# Patient Record
Sex: Male | Born: 1950
Health system: Southern US, Community
[De-identification: ages and names within clinical notes are randomized; demographics above are authoritative.]

## PROBLEM LIST (undated history)

## (undated) DIAGNOSIS — Z9109 Other allergy status, other than to drugs and biological substances: Secondary | ICD-10-CM

## (undated) DIAGNOSIS — K922 Gastrointestinal hemorrhage, unspecified: Secondary | ICD-10-CM

## (undated) DIAGNOSIS — T7840XA Allergy, unspecified, initial encounter: Secondary | ICD-10-CM

## (undated) DIAGNOSIS — M87059 Idiopathic aseptic necrosis of unspecified femur: Secondary | ICD-10-CM

## (undated) DIAGNOSIS — J449 Chronic obstructive pulmonary disease, unspecified: Secondary | ICD-10-CM

## (undated) DIAGNOSIS — K226 Gastro-esophageal laceration-hemorrhage syndrome: Secondary | ICD-10-CM

## (undated) DIAGNOSIS — D62 Acute posthemorrhagic anemia: Secondary | ICD-10-CM

## (undated) DIAGNOSIS — E785 Hyperlipidemia, unspecified: Secondary | ICD-10-CM

## (undated) DIAGNOSIS — S82201A Unspecified fracture of shaft of right tibia, initial encounter for closed fracture: Secondary | ICD-10-CM

## (undated) DIAGNOSIS — F17200 Nicotine dependence, unspecified, uncomplicated: Secondary | ICD-10-CM

## (undated) DIAGNOSIS — I10 Essential (primary) hypertension: Secondary | ICD-10-CM

## (undated) DIAGNOSIS — S2249XA Multiple fractures of ribs, unspecified side, initial encounter for closed fracture: Secondary | ICD-10-CM

## (undated) DIAGNOSIS — Z8781 Personal history of (healed) traumatic fracture: Secondary | ICD-10-CM

## (undated) DIAGNOSIS — S42009A Fracture of unspecified part of unspecified clavicle, initial encounter for closed fracture: Secondary | ICD-10-CM

## (undated) HISTORY — PX: KNEE SURGERY: SHX244

## (undated) HISTORY — DX: Essential (primary) hypertension: I10

## (undated) HISTORY — PX: WRIST SURGERY: SHX841

## (undated) HISTORY — DX: Chronic obstructive pulmonary disease, unspecified: J44.9

## (undated) HISTORY — DX: Allergy, unspecified, initial encounter: T78.40XA

## (undated) HISTORY — DX: Hyperlipidemia, unspecified: E78.5

## (undated) HISTORY — DX: Nicotine dependence, unspecified, uncomplicated: F17.200

## (undated) HISTORY — PX: ANKLE SURGERY: SHX546

---

## 1999-07-08 ENCOUNTER — Encounter: Payer: Self-pay | Admitting: Emergency Medicine

## 1999-07-08 ENCOUNTER — Emergency Department (HOSPITAL_COMMUNITY): Admission: EM | Admit: 1999-07-08 | Discharge: 1999-07-08 | Payer: Self-pay | Admitting: Emergency Medicine

## 1999-07-09 ENCOUNTER — Encounter: Payer: Self-pay | Admitting: Emergency Medicine

## 1999-08-19 ENCOUNTER — Encounter: Admission: RE | Admit: 1999-08-19 | Discharge: 1999-10-13 | Payer: Self-pay | Admitting: Specialist

## 1999-11-09 ENCOUNTER — Encounter: Payer: Self-pay | Admitting: Emergency Medicine

## 1999-11-09 ENCOUNTER — Emergency Department (HOSPITAL_COMMUNITY): Admission: EM | Admit: 1999-11-09 | Discharge: 1999-11-09 | Payer: Self-pay | Admitting: Emergency Medicine

## 2000-04-04 ENCOUNTER — Emergency Department (HOSPITAL_COMMUNITY): Admission: EM | Admit: 2000-04-04 | Discharge: 2000-04-04 | Payer: Self-pay | Admitting: Emergency Medicine

## 2001-12-20 ENCOUNTER — Emergency Department (HOSPITAL_COMMUNITY): Admission: EM | Admit: 2001-12-20 | Discharge: 2001-12-21 | Payer: Self-pay | Admitting: Emergency Medicine

## 2001-12-21 ENCOUNTER — Encounter: Payer: Self-pay | Admitting: Emergency Medicine

## 2001-12-26 ENCOUNTER — Ambulatory Visit (HOSPITAL_BASED_OUTPATIENT_CLINIC_OR_DEPARTMENT_OTHER): Admission: RE | Admit: 2001-12-26 | Discharge: 2001-12-26 | Payer: Self-pay | Admitting: Orthopedic Surgery

## 2002-01-02 ENCOUNTER — Ambulatory Visit (HOSPITAL_BASED_OUTPATIENT_CLINIC_OR_DEPARTMENT_OTHER): Admission: RE | Admit: 2002-01-02 | Discharge: 2002-01-02 | Payer: Self-pay | Admitting: Orthopedic Surgery

## 2002-01-03 ENCOUNTER — Emergency Department (HOSPITAL_COMMUNITY): Admission: EM | Admit: 2002-01-03 | Discharge: 2002-01-03 | Payer: Self-pay | Admitting: *Deleted

## 2002-09-18 ENCOUNTER — Encounter: Payer: Self-pay | Admitting: Orthopedic Surgery

## 2002-09-18 ENCOUNTER — Inpatient Hospital Stay (HOSPITAL_COMMUNITY): Admission: RE | Admit: 2002-09-18 | Discharge: 2002-09-19 | Payer: Self-pay | Admitting: Orthopedic Surgery

## 2003-05-16 ENCOUNTER — Encounter: Payer: Self-pay | Admitting: Emergency Medicine

## 2003-05-17 ENCOUNTER — Encounter: Payer: Self-pay | Admitting: General Surgery

## 2003-05-17 ENCOUNTER — Inpatient Hospital Stay (HOSPITAL_COMMUNITY): Admission: AC | Admit: 2003-05-17 | Discharge: 2003-06-12 | Payer: Self-pay

## 2003-05-26 ENCOUNTER — Encounter: Payer: Self-pay | Admitting: General Surgery

## 2003-05-28 ENCOUNTER — Encounter: Payer: Self-pay | Admitting: General Surgery

## 2003-06-01 ENCOUNTER — Encounter: Payer: Self-pay | Admitting: Cardiothoracic Surgery

## 2003-06-02 ENCOUNTER — Encounter: Payer: Self-pay | Admitting: Cardiothoracic Surgery

## 2003-06-04 ENCOUNTER — Encounter: Payer: Self-pay | Admitting: Cardiothoracic Surgery

## 2003-06-09 ENCOUNTER — Encounter: Payer: Self-pay | Admitting: Thoracic Surgery

## 2003-06-10 ENCOUNTER — Encounter: Payer: Self-pay | Admitting: Thoracic Surgery

## 2003-06-11 ENCOUNTER — Encounter: Payer: Self-pay | Admitting: Thoracic Surgery

## 2003-06-20 ENCOUNTER — Encounter: Payer: Self-pay | Admitting: Thoracic Surgery

## 2003-06-20 ENCOUNTER — Encounter: Admission: RE | Admit: 2003-06-20 | Discharge: 2003-06-20 | Payer: Self-pay | Admitting: Thoracic Surgery

## 2003-07-10 ENCOUNTER — Encounter: Admission: RE | Admit: 2003-07-10 | Discharge: 2003-07-10 | Payer: Self-pay | Admitting: Thoracic Surgery

## 2003-07-10 ENCOUNTER — Encounter: Payer: Self-pay | Admitting: Thoracic Surgery

## 2003-09-10 ENCOUNTER — Encounter: Admission: RE | Admit: 2003-09-10 | Discharge: 2003-09-10 | Payer: Self-pay | Admitting: Thoracic Surgery

## 2003-12-31 ENCOUNTER — Encounter: Admission: RE | Admit: 2003-12-31 | Discharge: 2003-12-31 | Payer: Self-pay | Admitting: Thoracic Surgery

## 2004-01-15 ENCOUNTER — Ambulatory Visit (HOSPITAL_COMMUNITY): Admission: RE | Admit: 2004-01-15 | Discharge: 2004-01-15 | Payer: Self-pay | Admitting: Orthopedic Surgery

## 2004-09-20 ENCOUNTER — Emergency Department (HOSPITAL_COMMUNITY): Admission: EM | Admit: 2004-09-20 | Discharge: 2004-09-20 | Payer: Self-pay | Admitting: Family Medicine

## 2004-11-10 ENCOUNTER — Ambulatory Visit: Payer: Self-pay | Admitting: Family Medicine

## 2004-11-23 ENCOUNTER — Ambulatory Visit: Payer: Self-pay | Admitting: Family Medicine

## 2004-11-30 ENCOUNTER — Ambulatory Visit: Payer: Self-pay | Admitting: Family Medicine

## 2004-12-06 ENCOUNTER — Emergency Department (HOSPITAL_COMMUNITY): Admission: EM | Admit: 2004-12-06 | Discharge: 2004-12-06 | Payer: Self-pay | Admitting: Emergency Medicine

## 2004-12-07 ENCOUNTER — Ambulatory Visit: Payer: Self-pay | Admitting: Sports Medicine

## 2004-12-28 ENCOUNTER — Ambulatory Visit: Payer: Self-pay | Admitting: Family Medicine

## 2005-01-25 ENCOUNTER — Ambulatory Visit: Payer: Self-pay | Admitting: Family Medicine

## 2005-03-02 ENCOUNTER — Ambulatory Visit: Payer: Self-pay | Admitting: Family Medicine

## 2005-04-14 ENCOUNTER — Ambulatory Visit (HOSPITAL_COMMUNITY): Admission: RE | Admit: 2005-04-14 | Discharge: 2005-04-14 | Payer: Self-pay | Admitting: Gastroenterology

## 2005-11-16 ENCOUNTER — Ambulatory Visit: Payer: Self-pay | Admitting: Family Medicine

## 2005-12-21 ENCOUNTER — Ambulatory Visit: Payer: Self-pay | Admitting: Family Medicine

## 2005-12-28 ENCOUNTER — Ambulatory Visit (HOSPITAL_COMMUNITY): Admission: RE | Admit: 2005-12-28 | Discharge: 2005-12-28 | Payer: Self-pay | Admitting: Family Medicine

## 2006-01-20 ENCOUNTER — Ambulatory Visit: Payer: Self-pay | Admitting: Family Medicine

## 2006-03-08 ENCOUNTER — Ambulatory Visit: Payer: Self-pay | Admitting: Family Medicine

## 2006-04-11 ENCOUNTER — Ambulatory Visit: Payer: Self-pay | Admitting: Sports Medicine

## 2006-04-11 ENCOUNTER — Ambulatory Visit (HOSPITAL_COMMUNITY): Admission: RE | Admit: 2006-04-11 | Discharge: 2006-04-11 | Payer: Self-pay | Admitting: Family Medicine

## 2006-05-25 ENCOUNTER — Ambulatory Visit: Payer: Self-pay | Admitting: Family Medicine

## 2006-10-27 ENCOUNTER — Ambulatory Visit: Payer: Self-pay | Admitting: Family Medicine

## 2006-11-03 ENCOUNTER — Ambulatory Visit: Payer: Self-pay | Admitting: Family Medicine

## 2006-11-03 ENCOUNTER — Encounter (INDEPENDENT_AMBULATORY_CARE_PROVIDER_SITE_OTHER): Payer: Self-pay | Admitting: Family Medicine

## 2006-11-03 LAB — CONVERTED CEMR LAB
ALT: 31 units/L (ref 0–53)
AST: 32 units/L (ref 0–37)
Albumin: 4.5 g/dL (ref 3.5–5.2)
Alkaline Phosphatase: 77 units/L (ref 39–117)
BUN: 12 mg/dL (ref 6–23)
CO2: 25 meq/L (ref 19–32)
Calcium: 10.1 mg/dL (ref 8.4–10.5)
Chloride: 104 meq/L (ref 96–112)
Cholesterol: 226 mg/dL — ABNORMAL HIGH (ref 0–200)
Creatinine, Ser: 0.95 mg/dL (ref 0.40–1.50)
Glucose, Bld: 94 mg/dL (ref 70–99)
HDL: 62 mg/dL (ref >39)
INR: 1.1 (ref 0.0–1.5)
LDL Cholesterol: 146 mg/dL — ABNORMAL HIGH (ref 0–99)
Potassium: 4 meq/L (ref 3.5–5.3)
Prothrombin Time: 14.1 s (ref 11.6–15.2)
Sodium: 141 meq/L (ref 135–145)
TSH: 1.285 microintl units/mL (ref 0.350–5.50)
Total Bilirubin: 0.6 mg/dL (ref 0.3–1.2)
Total CHOL/HDL Ratio: 3.6
Total Protein: 8 g/dL (ref 6.0–8.3)
Triglycerides: 90 mg/dL (ref <150)
VLDL: 18 mg/dL (ref 0–40)
aPTT: 33 s (ref 24–37)

## 2006-11-06 ENCOUNTER — Ambulatory Visit: Payer: Self-pay | Admitting: Family Medicine

## 2006-12-08 ENCOUNTER — Encounter: Admission: RE | Admit: 2006-12-08 | Discharge: 2006-12-08 | Payer: Self-pay | Admitting: Sports Medicine

## 2006-12-21 DIAGNOSIS — J449 Chronic obstructive pulmonary disease, unspecified: Secondary | ICD-10-CM

## 2006-12-21 DIAGNOSIS — Z720 Tobacco use: Secondary | ICD-10-CM | POA: Insufficient documentation

## 2006-12-21 DIAGNOSIS — E785 Hyperlipidemia, unspecified: Secondary | ICD-10-CM | POA: Insufficient documentation

## 2006-12-21 DIAGNOSIS — I10 Essential (primary) hypertension: Secondary | ICD-10-CM | POA: Insufficient documentation

## 2006-12-21 DIAGNOSIS — J4489 Other specified chronic obstructive pulmonary disease: Secondary | ICD-10-CM | POA: Insufficient documentation

## 2006-12-21 DIAGNOSIS — F17200 Nicotine dependence, unspecified, uncomplicated: Secondary | ICD-10-CM

## 2006-12-21 HISTORY — DX: Nicotine dependence, unspecified, uncomplicated: F17.200

## 2006-12-21 HISTORY — DX: Essential (primary) hypertension: I10

## 2006-12-21 HISTORY — DX: Hyperlipidemia, unspecified: E78.5

## 2006-12-21 HISTORY — DX: Chronic obstructive pulmonary disease, unspecified: J44.9

## 2006-12-28 ENCOUNTER — Telehealth: Payer: Self-pay | Admitting: *Deleted

## 2007-03-12 ENCOUNTER — Ambulatory Visit: Payer: Self-pay | Admitting: Sports Medicine

## 2007-03-21 ENCOUNTER — Ambulatory Visit: Payer: Self-pay | Admitting: Cardiology

## 2007-03-21 ENCOUNTER — Ambulatory Visit (HOSPITAL_COMMUNITY): Admission: RE | Admit: 2007-03-21 | Discharge: 2007-03-21 | Payer: Self-pay | Admitting: Sports Medicine

## 2007-03-21 ENCOUNTER — Encounter: Payer: Self-pay | Admitting: Sports Medicine

## 2007-05-03 ENCOUNTER — Ambulatory Visit: Payer: Self-pay | Admitting: Sports Medicine

## 2007-05-03 DIAGNOSIS — J301 Allergic rhinitis due to pollen: Secondary | ICD-10-CM | POA: Insufficient documentation

## 2007-08-22 ENCOUNTER — Encounter (INDEPENDENT_AMBULATORY_CARE_PROVIDER_SITE_OTHER): Payer: Self-pay | Admitting: Family Medicine

## 2007-08-22 ENCOUNTER — Ambulatory Visit: Payer: Self-pay | Admitting: Family Medicine

## 2007-08-22 DIAGNOSIS — Q6689 Other  specified congenital deformities of feet: Secondary | ICD-10-CM | POA: Insufficient documentation

## 2007-08-22 LAB — CONVERTED CEMR LAB
ALT: 27 units/L (ref 0–53)
AST: 33 units/L (ref 0–37)
Albumin: 4.7 g/dL (ref 3.5–5.2)
Alkaline Phosphatase: 85 units/L (ref 39–117)
BUN: 13 mg/dL (ref 6–23)
CO2: 25 meq/L (ref 19–32)
Calcium: 10.3 mg/dL (ref 8.4–10.5)
Chloride: 100 meq/L (ref 96–112)
Creatinine, Ser: 1.06 mg/dL (ref 0.40–1.50)
Glucose, Bld: 89 mg/dL (ref 70–99)
Potassium: 3.7 meq/L (ref 3.5–5.3)
Sodium: 137 meq/L (ref 135–145)
Total Bilirubin: 0.7 mg/dL (ref 0.3–1.2)
Total Protein: 8.1 g/dL (ref 6.0–8.3)

## 2007-08-23 ENCOUNTER — Encounter (INDEPENDENT_AMBULATORY_CARE_PROVIDER_SITE_OTHER): Payer: Self-pay | Admitting: Family Medicine

## 2007-09-06 ENCOUNTER — Ambulatory Visit: Payer: Self-pay | Admitting: Family Medicine

## 2007-11-23 ENCOUNTER — Ambulatory Visit: Payer: Self-pay | Admitting: Family Medicine

## 2007-12-31 ENCOUNTER — Encounter (INDEPENDENT_AMBULATORY_CARE_PROVIDER_SITE_OTHER): Payer: Self-pay | Admitting: Family Medicine

## 2008-01-07 ENCOUNTER — Ambulatory Visit: Payer: Self-pay | Admitting: Sports Medicine

## 2008-02-06 ENCOUNTER — Ambulatory Visit: Payer: Self-pay | Admitting: Family Medicine

## 2008-02-06 ENCOUNTER — Encounter (INDEPENDENT_AMBULATORY_CARE_PROVIDER_SITE_OTHER): Payer: Self-pay | Admitting: Family Medicine

## 2008-02-06 LAB — CONVERTED CEMR LAB
Cholesterol: 226 mg/dL — ABNORMAL HIGH (ref 0–200)
HDL: 77 mg/dL (ref >39)
LDL Cholesterol: 129 mg/dL — ABNORMAL HIGH (ref 0–99)
Total CHOL/HDL Ratio: 2.9
Triglycerides: 99 mg/dL (ref <150)
VLDL: 20 mg/dL (ref 0–40)

## 2008-02-08 ENCOUNTER — Encounter (INDEPENDENT_AMBULATORY_CARE_PROVIDER_SITE_OTHER): Payer: Self-pay | Admitting: Family Medicine

## 2008-02-19 ENCOUNTER — Encounter (INDEPENDENT_AMBULATORY_CARE_PROVIDER_SITE_OTHER): Payer: Self-pay | Admitting: *Deleted

## 2008-04-28 ENCOUNTER — Ambulatory Visit: Payer: Self-pay | Admitting: Family Medicine

## 2008-04-28 ENCOUNTER — Encounter: Payer: Self-pay | Admitting: Family Medicine

## 2008-04-28 LAB — CONVERTED CEMR LAB

## 2008-05-05 LAB — CONVERTED CEMR LAB
ALT: 87 units/L — ABNORMAL HIGH (ref 0–53)
AST: 112 units/L — ABNORMAL HIGH (ref 0–37)
Albumin: 4.7 g/dL (ref 3.5–5.2)
Alkaline Phosphatase: 105 units/L (ref 39–117)
BUN: 11 mg/dL (ref 6–23)
CO2: 21 meq/L (ref 19–32)
Calcium: 9.5 mg/dL (ref 8.4–10.5)
Chloride: 98 meq/L (ref 96–112)
Creatinine, Ser: 0.86 mg/dL (ref 0.40–1.50)
Glucose, Bld: 72 mg/dL (ref 70–99)
Potassium: 4.7 meq/L (ref 3.5–5.3)
Sodium: 139 meq/L (ref 135–145)
Total Bilirubin: 0.6 mg/dL (ref 0.3–1.2)
Total Protein: 8.2 g/dL (ref 6.0–8.3)

## 2008-08-19 ENCOUNTER — Ambulatory Visit: Payer: Self-pay | Admitting: Family Medicine

## 2008-08-21 ENCOUNTER — Emergency Department (HOSPITAL_COMMUNITY): Admission: EM | Admit: 2008-08-21 | Discharge: 2008-08-21 | Payer: Self-pay | Admitting: Emergency Medicine

## 2008-08-22 ENCOUNTER — Encounter: Payer: Self-pay | Admitting: Family Medicine

## 2008-09-10 ENCOUNTER — Encounter: Payer: Self-pay | Admitting: Family Medicine

## 2008-09-10 ENCOUNTER — Ambulatory Visit: Payer: Self-pay | Admitting: Family Medicine

## 2008-10-13 ENCOUNTER — Encounter: Payer: Self-pay | Admitting: Family Medicine

## 2009-01-09 ENCOUNTER — Encounter: Payer: Self-pay | Admitting: *Deleted

## 2009-02-02 ENCOUNTER — Ambulatory Visit: Payer: Self-pay | Admitting: Family Medicine

## 2009-04-22 ENCOUNTER — Telehealth: Payer: Self-pay | Admitting: Family Medicine

## 2009-05-14 ENCOUNTER — Ambulatory Visit: Payer: Self-pay | Admitting: Family Medicine

## 2009-05-14 ENCOUNTER — Encounter: Payer: Self-pay | Admitting: Family Medicine

## 2009-05-14 DIAGNOSIS — F1020 Alcohol dependence, uncomplicated: Secondary | ICD-10-CM | POA: Insufficient documentation

## 2009-05-18 LAB — CONVERTED CEMR LAB
ALT: 47 units/L (ref 0–53)
AST: 65 units/L — ABNORMAL HIGH (ref 0–37)
Albumin: 3.8 g/dL (ref 3.5–5.2)
Alkaline Phosphatase: 96 units/L (ref 39–117)
BUN: 10 mg/dL (ref 6–23)
CO2: 28 meq/L (ref 19–32)
Calcium: 10 mg/dL (ref 8.4–10.5)
Chloride: 103 meq/L (ref 96–112)
Creatinine, Ser: 0.91 mg/dL (ref 0.40–1.50)
Glucose, Bld: 91 mg/dL (ref 70–99)
Potassium: 4.1 meq/L (ref 3.5–5.3)
Sodium: 142 meq/L (ref 135–145)
Total Bilirubin: 0.5 mg/dL (ref 0.3–1.2)
Total Protein: 7.5 g/dL (ref 6.0–8.3)

## 2009-08-09 ENCOUNTER — Ambulatory Visit: Payer: Self-pay | Admitting: Family Medicine

## 2009-08-09 ENCOUNTER — Encounter: Payer: Self-pay | Admitting: Family Medicine

## 2009-08-09 ENCOUNTER — Inpatient Hospital Stay (HOSPITAL_COMMUNITY): Admission: EM | Admit: 2009-08-09 | Discharge: 2009-08-10 | Payer: Self-pay | Admitting: Emergency Medicine

## 2009-08-09 DIAGNOSIS — S62109A Fracture of unspecified carpal bone, unspecified wrist, initial encounter for closed fracture: Secondary | ICD-10-CM | POA: Insufficient documentation

## 2010-07-15 ENCOUNTER — Ambulatory Visit: Payer: Self-pay | Admitting: Family Medicine

## 2010-07-15 ENCOUNTER — Encounter: Payer: Self-pay | Admitting: Family Medicine

## 2010-07-15 DIAGNOSIS — G609 Hereditary and idiopathic neuropathy, unspecified: Secondary | ICD-10-CM | POA: Insufficient documentation

## 2010-07-15 LAB — CONVERTED CEMR LAB
ALT: 21 units/L (ref 0–53)
AST: 27 units/L (ref 0–37)
Albumin: 4.3 g/dL (ref 3.5–5.2)
Alkaline Phosphatase: 85 units/L (ref 39–117)
Chloride: 106 meq/L (ref 96–112)
Creatinine, Ser: 0.99 mg/dL (ref 0.40–1.50)
Glucose, Bld: 70 mg/dL (ref 70–99)
HCT: 41.8 % (ref 39.0–52.0)
HDL: 53 mg/dL (ref >39)
Hemoglobin: 14 g/dL (ref 13.0–17.0)
LDL Cholesterol: 158 mg/dL — ABNORMAL HIGH (ref 0–99)
Platelets: 232 10*3/uL (ref 150–400)
Potassium: 4.3 meq/L (ref 3.5–5.3)
Sodium: 142 meq/L (ref 135–145)
Triglycerides: 106 mg/dL (ref <150)

## 2010-11-13 ENCOUNTER — Encounter: Payer: Self-pay | Admitting: Thoracic Surgery

## 2010-11-13 ENCOUNTER — Encounter: Payer: Self-pay | Admitting: Orthopedic Surgery

## 2010-11-23 NOTE — Assessment & Plan Note (Signed)
Summary: ANNUAL WELLNESS/NO COPAY PER [facility]   Vital Signs:  Patient profile:   60 year old male Height:      69 inches Weight:      152 pounds BMI:     22.53 BSA:     1.84 Temp:     98.3 degrees F Pulse rate:   92 / minute BP sitting:   130 / 80  Vitals Entered By: Jone Baseman CMA (July 15, 2010 2:10 PM) CC: CPE Is Patient Diabetic? No Pain Assessment Patient in pain? no        Primary Care Provider:  Bobby Rumpf  MD  CC:  CPE.  History of Present Illness: 1) COPD:  Clear phlegm daily, varying in quantity. Dyspnea at baseline. No O2 requirement. Occasional wheeze at night. Cough worse in AM. Uses albuterol once in AM daily. Denies fever, n/v/d, myalgia, lethargy, hemoptysis, weight loss. Still smoking as below but cutting back.  2) HTN: 130/80 today. Denies chest pain, LE edema, urinary changes. Denies side effects from medications, HCTZ, Norvasc.   3) Tobacco abuse:  Has cut back by one cigarette q2 weeks. Down to 5 per day. Has not smoked today. Cravings worse around people who smoke. COPD as above.   4) Alcohol use: Used to drink 2-3 six packs per week. Doesn't like to drink water. CAGE 0 / 4. Has cut back (after my recommendations) to a 6 pack per week.   5) Prevention: Colonoscopy in 2006.   6) Leg numbness: Right leg numbness at anterior thigh x several months. Worse at night. Sometimes feels like burning. No weakness noted in legs, no back pain, no urinary or bowel difficulties, no fever, no weight changes, no  saddle anesthesia, no LAD, no rash. Symptoms not affected by position or by exertion.   Habits & Providers  Alcohol-Tobacco-Diet     Tobacco Status: current     Tobacco Counseling: to quit use of tobacco products     Cigarette Packs/Day: 0.5  Current Medications (verified): 1)  Hydrochlorothiazide 25 Mg Tabs (Hydrochlorothiazide) .... Take 1 Tablet By Mouth Once A Day 2)  Lipitor 80 Mg Tabs (Atorvastatin Calcium) .... Take 1 Tablet  By Mouth Once A Day 3)  Norvasc 5 Mg Tabs (Amlodipine Besylate) .... Take 2 Tablet By Mouth Once A Day 4)  Nasonex 50 Mcg/act  Susp (Mometasone Furoate) .Marland Kitchen.. 1 Spray Per Nostril Daily 5)  Claritin 10 Mg  Tabs (Loratadine) .Marland Kitchen.. 1 Tab By Mouth Daily 6)  Ventolin Hfa 108 (90 Base) Mcg/act Aers (Albuterol Sulfate) .... Two Puffs Q4hrs As Needed Wheezing or Dyspnea 7)  Naprosyn 375 Mg Tabs (Naproxen) .... Take One Tablet By Mouth Two Times A Day As Needed For Shoulder Pain. 8)  Spiriva Handihaler 18 Mcg Caps (Tiotropium Bromide Monohydrate) .... One Puff Inhaled Once A Day  Allergies (verified): 1)  ! Lisinopril (Lisinopril) 2)  ! Pcn 3)  ! Codeine 4)  ! Ampicillin  Physical Exam  General:  Pleasant, alert, appears older than stated age male.   Eyes:  pupils equal, round and reactive to light, extraoccular movements intact   Mouth:  Oral mucosa and oropharynx without lesions or exudates.   Neck:  no JVD or carotid bruit  Lungs:  normal respiratory effort, clear to asucultation bilaterally with good chest wall motion with inspiration and expiration.  Heart:  normal rate, regular rhythm, no murmur, no gallop, and no rub.   Abdomen:  s/nt/nd  Msk:  5/5 strength bilateral upper and  lower extremities with flexion and extension at wrists, elbows, shoulders, hips, knees, ankles  Pulses:  2+ pedals  Extremities:  no edema; clubbing noted on nails Neurologic:  alert & oriented X3.  cranial nerves II-XII intact and strength normal in all extremities.  decreased sensation to light touch, pain at anterior thigh on right. 1+ patellar reflexes bilaterally  Skin:  no suspicious skin changes    Impression & Recommendations:  Problem # 1:  ALCOHOL USE (ICD-305.00) Has cut back significantly (following my recommendations). CAGE 0/4 today. Averaging one beer per day without binge behaviors. PRecontemplative about stopping all alcohol intake - at this point I am fine with this given drastic reduction in  intake.   Problem # 2:  TOBACCO DEPENDENCE (ICD-305.1) Has dramatically reduced tobacco use, but precontemplative about quitting all the way.   Problem # 3:  HYPERTENSION, BENIGN SYSTEMIC (ICD-401.1)  At goal. Follow up in 3 months. CMET, lipids today.  His updated medication list for this problem includes:    Hydrochlorothiazide 25 Mg Tabs (Hydrochlorothiazide) .Marland Kitchen... Take 1 tablet by mouth once a day    Norvasc 5 Mg Tabs (Amlodipine besylate) .Marland Kitchen... Take 2 tablet by mouth once a day  Orders: Comp Met-[facility] ([id number])  BP today: 130/80 Prior BP: 178/93 (08/09/2009)  Labs Reviewed: K+: 4.1 (05/14/2009) Creat: : 0.91 (05/14/2009)   Chol: 226 (02/06/2008)   HDL: 77 (02/06/2008)   LDL: 129 (02/06/2008)   TG: 99 (02/06/2008)  Problem # 4:  HYPERLIPIDEMIA (ICD-272.4)  Lipid panel today.   His updated medication list for this problem includes:    Lipitor 80 Mg Tabs (Atorvastatin calcium) .Marland Kitchen... Take 1 tablet by mouth once a day  Orders: Lipid-[facility] ([id number])  Labs Reviewed: SGOT: 65 (05/14/2009)   SGPT: 47 (05/14/2009)   HDL:77 (02/06/2008), 62 (11/03/2006)  LDL:129 (02/06/2008), 146 (11/03/2006)  Chol:226 (02/06/2008), 226 (11/03/2006)  Trig:99 (02/06/2008), 90 (11/03/2006)  Problem # 5:  Preventive Health Care (ICD-V70.0) Due for colonoscopy this year, contemplative about performing this. Will follow up at next appointment.   Problem # 6:  COPD (ICD-496) Stable. Continue Spiriva and albuterol as needed. Drastically reduced smoking. Congratulated on this. Follow in three months.   His updated medication list for this problem includes:    Ventolin Hfa 108 (90 Base) Mcg/act Aers (Albuterol sulfate) .Marland Kitchen..Marland Kitchen Two puffs q4hrs as needed wheezing or dyspnea    Spiriva Handihaler 18 Mcg Caps (Tiotropium bromide monohydrate) ..... One puff inhaled once a day  Problem # 7:  PERIPHERAL NEUROPATHY (ICD-356.9) Unknown etiology. Consider heavy alcohol use vs vitamin deficiency vs metabolic  vs infectious / inflammatory. Orders: CBC-[facility] (64403) B12-[facility] ([id number]) Sed Rate (ESR)-[facility] 309 014 1603) RPR-[facility] 612 413 6633)  Complete Medication List: 1)  Hydrochlorothiazide 25 Mg Tabs (Hydrochlorothiazide) .... Take 1 tablet by mouth once a day 2)  Lipitor 80 Mg Tabs (Atorvastatin calcium) .... Take 1 tablet by mouth once a day 3)  Norvasc 5 Mg Tabs (Amlodipine besylate) .... Take 2 tablet by mouth once a day 4)  Nasonex 50 Mcg/act Susp (Mometasone furoate) .Marland Kitchen.. 1 spray per nostril daily 5)  Claritin 10 Mg Tabs (Loratadine) .Marland Kitchen.. 1 tab by mouth daily 6)  Ventolin Hfa 108 (90 Base) Mcg/act Aers (Albuterol sulfate) .... Two puffs q4hrs as needed wheezing or dyspnea 7)  Naprosyn 375 Mg Tabs (Naproxen) .... Take one tablet by mouth two times a day as needed for shoulder pain. 8)  Spiriva Handihaler 18 Mcg Caps (Tiotropium bromide monohydrate) .... One puff inhaled once a day  Other Orders:  [facility] - Est  40-64 yrs [phone])  Patient Instructions: 1)  It was great to see you today!  2)  I am glad you have started cutting back on alcohol - continue to do so. 3)  I am glad you started cutting back on cigarettes - continue to do so 4)  Follow up with me in three months.  Prescriptions: SPIRIVA HANDIHALER 18 MCG CAPS (TIOTROPIUM BROMIDE MONOHYDRATE) One puff inhaled once a day  #30 x 3   Entered and Authorized by:   Bobby Rumpf  MD   Signed by:   Bobby Rumpf  MD on 07/15/2010   Method used:   Electronically to        [facility] [address] [phone]* (retail)       [address]       [place], [place]  [address]       Ph: [phone] or [phone]       Fax: [phone]   RxID:   [id number] VENTOLIN HFA 108 (90 BASE) MCG/ACT AERS (ALBUTEROL SULFATE) two puffs q4hrs as needed wheezing or dyspnea  #1 x 1   Entered and Authorized by:   Bobby Rumpf  MD   Signed by:   Bobby Rumpf  MD on 07/15/2010   Method used:   Electronically to        [facility]  [phone]* (retail)       [address]       [place], [place]  [address]       Ph: [phone] or [phone]       Fax: [phone]   RxID:   [id number] CLARITIN 10 MG  TABS (LORATADINE) 1 tab by mouth daily  #31 x 3   Entered and Authorized by:   Bobby Rumpf  MD   Signed by:   Bobby Rumpf  MD on 07/15/2010   Method used:   [facility] to        [facility] [phone]* (retail)       [address]       [place], [place]  [address]       Ph: [phone] or [phone]       Fax: [phone]   RxID:   [id number] NASONEX 50 MCG/ACT  SUSP (MOMETASONE FUROATE) 1 spray per nostril daily  #1 x 4   Entered and Authorized by:   Bobby Rumpf  MD   Signed by:   Bobby Rumpf  MD on 07/15/2010   Method used:   [facility]ronically to        [facility] [phone]* (retail)       [address]       [place], [place]  [address]       Ph: [phone] or [phone]       Fax: [phone]   RxID:   [id number] NORVASC 5 MG TABS (AMLODIPINE BESYLATE) Take 2 tablet by mouth once a day  #62 x 3   Entered and Authorized by:   Bobby Rumpf  MD   Signed by:   Bobby Rumpf  MD on 07/15/2010   Method used:   [facility]ronically to        [facility] [phone]* (retail)       [address]       [place], [place]  [address]       Ph: [phone] or [phone]       Fax: [phone]   RxID:   [id number] LIPITOR 80 MG TABS (ATORVASTATIN CALCIUM) Take 1 tablet by mouth once a day  #31 x 3   Entered and Authorized by:   Bobby Rumpf  MD   Signed by:   Bobby Rumpf  MD on 07/15/2010   Method used:   [facility]ronically to        [facility]  [address] [phone]* (retail)       [address]       [place], [place]  [address]       Ph: [phone] or [phone]       Fax: [phone]   RxID:   [id number] HYDROCHLOROTHIAZIDE 25 MG TABS  (HYDROCHLOROTHIAZIDE) Take 1 tablet by mouth once a day  #31 x 3   Entered and Authorized by:   Bobby Rumpf  MD   Signed by:   Bobby Rumpf  MD on 07/15/2010   Method used:   [facility]        [facility] [address] [phone]* (retail)       [address]       [place], [place]  [address]       Ph: [phone] or [phone]       Fax: [phone]   RxID:   [id number]   Laboratory Results   Blood Tests   Date/Time Received: July 15, 2010 2:37 PM  Date/Time Reported: July 15, 2010 4:09 PM   SED rate: 28 mm/hr  Comments: ...............test performed by......Marland KitchenBonnie A. [place], MLS (ASCP)cm     Appended Document: ANNUAL WELLNESS/NO COPAY PER [facility]    Clinical Lists Changes  Orders: Added new Service order of [facility] -Subsequent Annual Wellness Visit [phone]) - Signed

## 2011-01-27 LAB — BASIC METABOLIC PANEL
BUN: 5 mg/dL — ABNORMAL LOW (ref 6–23)
BUN: 6 mg/dL (ref 6–23)
CO2: 22 mEq/L (ref 19–32)
CO2: 27 mEq/L (ref 19–32)
Calcium: 9.4 mg/dL (ref 8.4–10.5)
Calcium: 9.5 mg/dL (ref 8.4–10.5)
Chloride: 102 mEq/L (ref 96–112)
Chloride: 106 mEq/L (ref 96–112)
Creatinine, Ser: 0.84 mg/dL (ref 0.4–1.5)
Creatinine, Ser: 0.87 mg/dL (ref 0.4–1.5)
GFR calc Af Amer: >60 mL/min (ref >60)
GFR calc Af Amer: >60 mL/min (ref >60)
GFR calc non Af Amer: >60 mL/min (ref >60)
GFR calc non Af Amer: >60 mL/min (ref >60)
Glucose, Bld: 118 mg/dL — ABNORMAL HIGH (ref 70–99)
Glucose, Bld: 80 mg/dL (ref 70–99)
Potassium: 3.8 mEq/L (ref 3.5–5.1)
Potassium: 3.9 mEq/L (ref 3.5–5.1)
Sodium: 138 mEq/L (ref 135–145)
Sodium: 140 mEq/L (ref 135–145)

## 2011-01-27 LAB — CBC
HCT: 38.3 % — ABNORMAL LOW (ref 39.0–52.0)
HCT: 39.9 % (ref 39.0–52.0)
Hemoglobin: 13.4 g/dL (ref 13.0–17.0)
Hemoglobin: 13.8 g/dL (ref 13.0–17.0)
MCHC: 34.5 g/dL (ref 30.0–36.0)
MCHC: 35 g/dL (ref 30.0–36.0)
MCV: 97.1 fL (ref 78.0–100.0)
MCV: 97.8 fL (ref 78.0–100.0)
Platelets: 118 10*3/uL — ABNORMAL LOW (ref 150–400)
Platelets: 127 10*3/uL — ABNORMAL LOW (ref 150–400)
RBC: 3.94 MIL/uL — ABNORMAL LOW (ref 4.22–5.81)
RBC: 4.08 MIL/uL — ABNORMAL LOW (ref 4.22–5.81)
RDW: 12.9 % (ref 11.5–15.5)
RDW: 13.2 % (ref 11.5–15.5)
WBC: 5.5 10*3/uL (ref 4.0–10.5)
WBC: 6.1 10*3/uL (ref 4.0–10.5)

## 2011-01-27 LAB — LIPID PANEL
Cholesterol: 187 mg/dL (ref 0–200)
HDL: 68 mg/dL (ref >39)
LDL Cholesterol: 102 mg/dL — ABNORMAL HIGH (ref 0–99)
Total CHOL/HDL Ratio: 2.8 RATIO
Triglycerides: 84 mg/dL (ref <150)
VLDL: 17 mg/dL (ref 0–40)

## 2011-01-27 LAB — DIFFERENTIAL
Basophils Absolute: 0.1 10*3/uL (ref 0.0–0.1)
Basophils Relative: 1 % (ref 0–1)
Eosinophils Absolute: 0 10*3/uL (ref 0.0–0.7)
Eosinophils Relative: 1 % (ref 0–5)
Lymphocytes Relative: 27 % (ref 12–46)
Lymphs Abs: 1.5 10*3/uL (ref 0.7–4.0)
Monocytes Absolute: 0.6 10*3/uL (ref 0.1–1.0)
Monocytes Relative: 10 % (ref 3–12)
Neutro Abs: 3.3 10*3/uL (ref 1.7–7.7)
Neutrophils Relative %: 61 % (ref 43–77)

## 2011-01-27 LAB — PROTIME-INR
INR: 0.95 (ref 0.00–1.49)
Prothrombin Time: 12.6 seconds (ref 11.6–15.2)

## 2011-01-27 LAB — APTT: aPTT: 25 seconds (ref 24–37)

## 2011-03-04 ENCOUNTER — Encounter: Payer: Self-pay | Admitting: Family Medicine

## 2011-03-04 ENCOUNTER — Ambulatory Visit (INDEPENDENT_AMBULATORY_CARE_PROVIDER_SITE_OTHER): Payer: Medicare Other | Admitting: Family Medicine

## 2011-03-04 VITALS — BP 150/84 | HR 96 | Temp 98.4°F | Wt 142.0 lb

## 2011-03-04 DIAGNOSIS — F17200 Nicotine dependence, unspecified, uncomplicated: Secondary | ICD-10-CM

## 2011-03-04 DIAGNOSIS — J301 Allergic rhinitis due to pollen: Secondary | ICD-10-CM

## 2011-03-04 DIAGNOSIS — J449 Chronic obstructive pulmonary disease, unspecified: Secondary | ICD-10-CM

## 2011-03-04 DIAGNOSIS — I10 Essential (primary) hypertension: Secondary | ICD-10-CM

## 2011-03-04 MED ORDER — HYDROCHLOROTHIAZIDE 25 MG PO TABS: 25.0000 mg | ORAL_TABLET | Freq: Every day | ORAL | Status: DC

## 2011-03-04 MED ORDER — FLUTICASONE PROPIONATE 50 MCG/ACT NA SUSP: 2.0000 | Freq: Every day | NASAL | Status: DC

## 2011-03-04 MED ORDER — ATORVASTATIN CALCIUM 80 MG PO TABS: 80.0000 mg | ORAL_TABLET | Freq: Every day | ORAL | Status: DC

## 2011-03-04 MED ORDER — LORATADINE 10 MG PO TABS: 10.0000 mg | ORAL_TABLET | Freq: Every day | ORAL | Status: DC

## 2011-03-04 MED ORDER — ALBUTEROL SULFATE HFA 108 (90 BASE) MCG/ACT IN AERS: 2.0000 | INHALATION_SPRAY | RESPIRATORY_TRACT | Status: DC | PRN

## 2011-03-04 MED ORDER — TIOTROPIUM BROMIDE MONOHYDRATE 18 MCG IN CAPS: 18.0000 ug | ORAL_CAPSULE | Freq: Every day | RESPIRATORY_TRACT | Status: DC

## 2011-03-04 MED ORDER — AMLODIPINE BESYLATE 10 MG PO TABS: 10.0000 mg | ORAL_TABLET | Freq: Every day | ORAL | Status: DC

## 2011-03-04 NOTE — Patient Instructions (Signed)
Follow up in 6 months for complete physical.  You will have a new doctor after June 30.  Keep working on quitting smoking. I have refilled your medications.  Your new nasal spray is called Flonase - start using this.  I have enjoyed being your physician! Have a great day!  - Dr. Wallene Huh

## 2011-03-05 NOTE — Assessment & Plan Note (Signed)
Deteriorated. Does not wish to try nicotine replacement or other pharmacologics. Advised to return to cutting back by one cigarette / day each week until quit. Will follow.

## 2011-03-05 NOTE — Assessment & Plan Note (Signed)
Deteriorated. Secondary to medication non-adherence. Explained to patient that he can call clinic for refills if he is out of his medications. Refilled. Follow at physical in 6 months. Repeat CMET, lipids at that time.

## 2011-03-05 NOTE — Progress Notes (Signed)
  Subjective:    Patient ID: Lucas [name], male    DOB: [birth year], 60 y.o.   MRN: [id number]  HPI  1) COPD:  Clear phlegm daily, varying in quantity. Dyspnea at baseline. No O2 requirement. Occasional wheeze at night. Cough worse in AM. Uses albuterol once in AM at least once a week. Denies fever, n/v/d, myalgia, lethargy, hemoptysis, weight loss. Still smoking as below.  2) HTN: 150/84 today.Out of medications for one week. Denies chest pain, LE edema, urinary changes. Denies side effects from medications, HCTZ, Norvasc.   3) Tobacco abuse:  Back up to 1/2 pack / day. Had gotten down to 5 cigarettes per day Cravings worse around people who smoke. COPD as above.   4) Alcohol use: Used to drink 2-3 six packs per week. Doesn't like to drink water. CAGE 0 / 4. Has cut back (after my recommendations) to a 6 pack per week.   5) Allergic rhinitis: Has not been using nasal steroid or antihistamine this season. Reports some post nasal drip, tickling in throat.  Pertinent past history reviewed.  Review of Systems As per HPI     Objective:   Physical Exam General:  Pleasant, alert, appears older than stated age male.   Eyes:  pupils equal, round and reactive to light, extraoccular movements intact   Mouth:  Oropharynx with mild cobblestoning, increased mucus.   Neck:  no JVD or carotid bruit  Lungs:  normal respiratory effort, clear to asucultation bilaterally with good chest wall motion with inspiration and expiration.  Heart:  normal rate, regular rhythm, no murmur, no gallop, and no rub.   Abdomen:  s/nt/nd   Pulses:  2+ pedals  Extremities:  no edema; clubbing noted on nails       Assessment & Plan:

## 2011-03-05 NOTE — Assessment & Plan Note (Signed)
Stable. Advised to continue medications. Contemplative about quitting smoking. Follow 6 months

## 2011-03-05 NOTE — Assessment & Plan Note (Signed)
Deteriorated. Restart nasal steroid - switched to Flonase. Restart Claritin.

## 2011-03-11 NOTE — Op Note (Signed)
[facility]  Patient:    CORWIN, KUIKEN Visit Number: [id number] MRN: [id number]          Service Type: EMS Location: [facility] Attending Physician:  Corlis Leak. Dictated by:   Georgena Spurling, M.D. Proc. Date: 01/02/02 Admit Date:  01/03/2002                             Operative Report  PREOPERATIVE DIAGNOSIS:  Left foot calcaneus fracture.  POSTOPERATIVE DIAGNOSIS:  Left foot calcaneus fracture.  OPERATION PERFORMED:  Left calcaneus open reduction internal fixation.  SURGEON:  Georgena Spurling, M.D.  ASSISTANT:  None.  ANESTHESIA:  INDICATIONS FOR PROCEDURE:  The patient is approximately 10 days out from a fall with MRI evidence of a type 4 calcaneus fracture with severe depression of the posterior facet and varus deformity.  Informed consent was obtained.  DESCRIPTION OF PROCEDURE:  The patient was taken to the operating room and laid in the supine position and administered general endotracheal anesthesia. Placed in the right down, left up lateral decubitus position.  The left lower extremity was prepped and draped in the usual sterile fashion.  A tourniquet was set at 300 for an hour.  A hockey stick incision was made with a #10 blade.  Sharp dissection was continued down to the lateral border of the calcaneus, lateral cortical border of the calcaneus in the distal aspect of the wound.  We took care in the proximal aspect of the wound to identify and not injure the sural nerve.  We then used subperiosteal dissection to expose the lateral wall of the calcaneus.  I then placed a large Steinmann pin in the heel as far posterior and inferior as I could.  I then opened up the cortical window fracture and saw that the posterior facet was completely displaced, tipped anteriorly and depressed for at least 3 cm.  I used a Therapist, nutritional to free up the fractures and elevate the posterior facet.  There were some extra chips of bone that I packed  in to buttress this up.  Once I obtained a reasonable reduction with distraction and valgus force on the heel, I took pictures under the C-arm to verify that I had the fracture reduced.  I then fashioned a small Synthes stainless steel plate to the lateral border of the calcaneus and inserted eight screws checking no the AP and Harris heel views on the C-arm for adequate screw length and overall alignment.  I was very satisfied with our alignment.  I did use at least four locking screws through the plate to prevent collapse.  I did not have to use any bone graft.  I then irrigated and then closed with interrupted 0 and 2-0 Vicryl sutures and 3-0 vertical mattress nylon sutures.  I dressed with Adaptic 4 by 4s and a Ivette Loyal dressing that did extend above the knee to his patellar fracture.  ESTIMATED BLOOD LOSS:  Minimal.  TOURNIQUET TIME:  1 hour 21 minutes.  COMPLICATIONS:  None.  DRAINS:  None. Dictated by:   Georgena Spurling, M.D. Attending Physician:  Corlis Leak DD:  01/02/02 TD:  01/03/02 Job: [id number]

## 2011-03-11 NOTE — Op Note (Signed)
NAME:  Lucas [name], Lucas [name]                            ACCOUNT NO.:  [id number]   MEDICAL RECORD NO.:  [id number]                   PATIENT TYPE:  INP   LOCATION:  5032                                 FACILITY:  [facility]   PHYSICIAN:  Nadara Mustard, M.D.                DATE OF BIRTH:  January 23, 1951   DATE OF PROCEDURE:  09/18/2002  DATE OF DISCHARGE:                                 OPERATIVE REPORT   PREOPERATIVE DIAGNOSIS:  Subtalar arthrosis, status post open reduction  internal fixation of calcaneus fracture.   POSTOPERATIVE DIAGNOSIS:  Subtalar arthrosis, status post open reduction  internal fixation of calcaneus fracture.   OPERATION PERFORMED:  1. Subtalar fusion with a 75 mm [place] right medical screw.  2. Self-directed C-arm for imaging.   SURGEON:  Nadara Mustard, M.D.   ANESTHESIA:  General endotracheal.   ESTIMATED BLOOD LOSS:  Minimal.   ANTIBIOTICS:  Kefzol 1 g.   TOURNIQUET TIME:  Esmarch to the ankle for approximately 40 minutes.   DISPOSITION:  To PACU in stable condition.   INDICATIONS FOR PROCEDURE:  The patient was a 60 year old gentleman who was  status post a calcaneus fracture for which he underwent open reduction  internal fixation.  The patient has had chronic pain, unable to perform  activities of daily living, has failed conservative care and presents at  this time for a subtalar fusion.  The risks and benefits were discussed  including infection, neurovascular injury, loss of inversion and eversion,  need for additional surgery.  The patient states that he understands and  wishes to proceed at this time.   DESCRIPTION OF PROCEDURE:  The patient was brought to operating room 4 and  underwent a general anesthetic.  After an adequate level of anesthesia was  obtained, the patient's left lower extremity was prepped using DuraPrep and  draped into a sterile field and Ioban was used to cover all exposed skin.  The patient previously had an extensile incision  along the vermilion border  of the lateral aspect of his heel.   A new oblique incision was made perpendicular to his previous incision.  This was carried down to the sinus tarsi and the fat pad was removed.  A  lamina spreader was placed and the subtalar posterior facet was exposed.  The cartilage was removed using osteotomes and curets.  The remainder of the  subtalar joint was removed of cartilage and was debrided back to good  bleeding cancellous bone.  The foot was then held plantar grade against the  chest with the knee flexed and the ankle in slight valgus.  A stab incision  was made over the talar neck.  __________  retractors were placed so that  the bone was exposed and there was no injury to any tendons or neurovascular  structures.   A guidewire was placed across the talar neck into the  calcaneus.  C-arm  fluoroscopy verified reduction.  This was done and sequentially drilled and  tapped for a 75 mm [place] screw.  This was then dynamically compressed  with the proximal screw threads.  C-arm fluoroscopy again verified reduction  in both AP and lateral planes.  The subtalar joint was then packed with  Allograft matrix.  The wound was previously irrigated.  The muscle and  fascia was closed over the graft using #2-0 Vicryl.  The skin was closed  using 2-0 nylon with a far-near-near-far stitch.  The dorsal incision was  also closed with a 2-0 nylon.  The wounds were covered with Adaptic  orthopedic sponges.  A compressive Webril dressing with  Coban was applied.   The patient was extubated and taken to PACU in stable condition, plan for  nonweightbearing and discharge when safe with ambulation.                                               Nadara Mustard, M.D.    MVD/MEDQ  D:  09/18/2002  T:  09/18/2002  Job:  [id number]

## 2011-03-11 NOTE — H&P (Signed)
   NAME:  Lucas [name], Lucas [name]                            ACCOUNT NO.:  [id number]   MEDICAL RECORD NO.:  [id number]                   PATIENT TYPE:  INP   LOCATION:  2550                                 FACILITY:  [facility]   PHYSICIAN:  Nadara Mustard, M.D.                DATE OF BIRTH:  1951-04-29   DATE OF ADMISSION:  09/18/2002  DATE OF DISCHARGE:                                HISTORY & PHYSICAL   HISTORY OF PRESENT ILLNESS:  The patient is a 60 year old gentleman who is  status post a calcaneous fracture for which he underwent open reduction,  internal fixation.  The patient has had progressive subtalar arthrosis,  unable to perform activities of daily living due to the subtalar pain and he  presents at this time for a subtalar fusion.   ALLERGIES:  PENICILLIN, AMPICILLIN AND CODEINE CAUSE A RASH.   MEDICATIONS:  None.   PREVIOUS SURGERIES:  ORIF of left calcaneus in the fall of 2002.  The  patient also has had right hand, wrist and skin graft at age 73.   FAMILY HISTORY:  Negative.   REVIEW OF SYSTEMS:  Positive for tobacco at one pack per day for over 30  years.   PHYSICAL EXAMINATION:  VITAL SIGNS:  Temperature 97.7,  pulse 76,  respiratory rate 12,  blood pressure  130/100, height 5 feet 11 inches,  weight 150 pounds.  GENERAL:  He is in no acute distress.  LUNGS:  Clear to auscultation.  CARDIOVASCULAR:  Regular rate and rhythm.  NECK:  Supple.  No bruits.  LEFT LOWER EXTREMITY:  He has about 10 degrees of inversion and eversion of  the subtalar joint which is extremely painful.  He has 10 degrees of  dorsiflexion.  He has a good dorsalis pedis pulse.  Radiograph shows  collapse of the posterior facet.   ASSESSMENT:  Subtalar arthrosis, status post open reduction, internal  reduction for a calcaneus fracture.   PLAN:  The patient is scheduled for subtalar fusion at this time.  The risks  and benefits were discussed including infection, neurovascular injury,  persistent  pain, need for additional surgery and decreased inversion or  eversion due to the fusion.  The patient states he understands and wishes to  proceed at this time.                                               Nadara Mustard, M.D.    MVD/MEDQ  D:  09/18/2002  T:  09/18/2002  Job:  [id number]

## 2011-03-11 NOTE — Op Note (Signed)
NAME:  Lucas [name], Lucas [name] NO.:  [id number]   MEDICAL RECORD NO.:  [id number]          PATIENT TYPE:  AMB   LOCATION:  ENDO                         FACILITY:  [facility]   PHYSICIAN:  Graylin Shiver, M.D.   DATE OF BIRTH:  [birth year]   DATE OF PROCEDURE:  04/14/2005  DATE OF DISCHARGE:                                 OPERATIVE REPORT   PROCEDURE:  Colonoscopy.   INDICATION FOR PROCEDURE:  Rectal bleeding.   Informed consent was obtained after explanation of the risks of bleeding,  infection and perforation.   PREMEDICATION:  Fentanyl 75 mcg IV and Versed 7.5 mg IV.   DESCRIPTION OF PROCEDURE:  With the patient in the left lateral decubitus  position, a rectal exam was performed. No masses were felt. The Olympus  colonoscope was inserted into the rectum and advanced around the colon to  the cecum. Cecal landmarks were identified. The cecum and ascending colon  were normal. The transverse colon was normal. The descending colon, sigmoid  and rectum looked normal. The scope was retroflexed in the rectum. There  were some hemorrhoidal tags noted. He tolerated this procedure well without  complications.   IMPRESSION:  Normal colonoscopy to the cecum with hemorrhoidal tags.       SFG/MEDQ  D:  04/14/2005  T:  04/14/2005  Job:  [id number]   cc:   Hansel Feinstein, M.D.

## 2011-03-11 NOTE — Op Note (Signed)
. [facility]  Patient:    Lucas [name], Lucas [name] Visit Number: [id number] MRN: [id number]          Service Type: DSU Location: [facility] Attending Physician:  Teena Dunk Dictated by:   Sharlot Gowda., M.D. Proc. Date: 12/26/01 Admit Date:  12/26/2001                             Operative Report  PREOPERATIVE DIAGNOSIS:  Displaced comminuted patellar fracture to the left leg.  POSTOPERATIVE DIAGNOSIS:  Displaced comminuted patellar fracture to the left leg.  OPERATION:  Open reduction and internal fixation of patellar fracture with K-wires and 18-gauge figure-of-eight wire.  SURGEON:  Sharlot Gowda., M.D.  ASSISTANT:  Arnoldo Morale, P.A.  TOURNIQUET TIME:  One hour.  INDICATIONS:  A 60 year old with displaced patellar calcaneus fractures. Scheduled for open reduction and internal fixation of both.  Swelling and skin problems in the heel, preventing surgery on the heel today, but it was thought that with the displaced patellar fracture that this needed to be fixed, and this would be accomplished as an outpatient with a possible overnight.  DESCRIPTION OF PROCEDURE:  A longitudinal incision in the knee, identification of a very comminuted transverse patellar fracture.  Several small fragments of articular bone had to be removed secondary to comminution.  However, there were ________ two large pieces transfixed with two K-wires followed by a figure-of-eight wire.  The wire was placed deep in the patellar tendon using an angiocath needle.  This captured the K-wires nicely.  The K-wires were placed superficial to the 18-gauge wire to prevent the wire from going through the patellar tendon.  Likewise, the K-wires were bent superiorly and tapped into the bone to prevent some more escape of the wire.  The wire was twisted prior to this to create a tension band effect and confirmed to be in good position on the C-arm fluoroscopy.   The knee placed through a range of motion to about 60-70 degrees without any displacement noted, and again no displacement noted on the C-arm.  The wound was copiously irrigated.  Prior to this, the knee joint had been inspected.  There was no intra-articular pathology noted.  Closure was effected with running interrupted #1 Vicryl on the retinaculum which was widely torn medially and laterally.  Superficial retinaculum longitudinally closed with 2-0 Vicryls as well as subcutaneous tissues with 2-0 Vicryls, and skin clips on the skin.  Marcaine with epinephrine on the skin.  A lightly compressive sterile dressing, knee immobilizer, and bulky Jones dressing applied with the knee straight, and a well-padded posterior splint applied to the calcaneus. Dictated by:   Sharlot Gowda., M.D. Attending Physician:  Teena Dunk DD:  12/26/01 TD:  12/27/01 Job: [id number]

## 2011-07-26 LAB — URINALYSIS, ROUTINE W REFLEX MICROSCOPIC
Bilirubin Urine: NEGATIVE
Glucose, UA: NEGATIVE
Hgb urine dipstick: NEGATIVE
Ketones, ur: NEGATIVE
Nitrite: NEGATIVE
Protein, ur: NEGATIVE
Specific Gravity, Urine: 1.016
Urobilinogen, UA: 0.2
pH: 6

## 2011-08-26 ENCOUNTER — Other Ambulatory Visit: Payer: Self-pay | Admitting: Family Medicine

## 2011-08-26 NOTE — Telephone Encounter (Signed)
Refill request

## 2011-08-28 ENCOUNTER — Encounter: Payer: Self-pay | Admitting: Family Medicine

## 2011-08-30 ENCOUNTER — Ambulatory Visit: Payer: Medicare Other | Admitting: Family Medicine

## 2012-01-03 ENCOUNTER — Ambulatory Visit (INDEPENDENT_AMBULATORY_CARE_PROVIDER_SITE_OTHER): Payer: Medicare Other | Admitting: Family Medicine

## 2012-01-03 ENCOUNTER — Encounter: Payer: Self-pay | Admitting: Family Medicine

## 2012-01-03 DIAGNOSIS — I10 Essential (primary) hypertension: Secondary | ICD-10-CM

## 2012-01-03 DIAGNOSIS — M6740 Ganglion, unspecified site: Secondary | ICD-10-CM | POA: Insufficient documentation

## 2012-01-03 DIAGNOSIS — J449 Chronic obstructive pulmonary disease, unspecified: Secondary | ICD-10-CM

## 2012-01-03 DIAGNOSIS — F17200 Nicotine dependence, unspecified, uncomplicated: Secondary | ICD-10-CM

## 2012-01-03 DIAGNOSIS — E785 Hyperlipidemia, unspecified: Secondary | ICD-10-CM

## 2012-01-03 MED ORDER — AMLODIPINE BESYLATE 10 MG PO TABS: 10.0000 mg | ORAL_TABLET | Freq: Every day | ORAL | Status: DC

## 2012-01-03 MED ORDER — ATORVASTATIN CALCIUM 80 MG PO TABS: 80.0000 mg | ORAL_TABLET | Freq: Every day | ORAL | Status: DC

## 2012-01-03 MED ORDER — ALBUTEROL SULFATE HFA 108 (90 BASE) MCG/ACT IN AERS: 2.0000 | INHALATION_SPRAY | RESPIRATORY_TRACT | Status: DC | PRN

## 2012-01-03 MED ORDER — HYDROCHLOROTHIAZIDE 25 MG PO TABS: 25.0000 mg | ORAL_TABLET | Freq: Every day | ORAL | Status: DC

## 2012-01-03 MED ORDER — TIOTROPIUM BROMIDE MONOHYDRATE 18 MCG IN CAPS: 18.0000 ug | ORAL_CAPSULE | Freq: Every day | RESPIRATORY_TRACT | Status: DC

## 2012-01-03 MED ORDER — LEVOCETIRIZINE DIHYDROCHLORIDE 5 MG PO TABS: 5.0000 mg | ORAL_TABLET | Freq: Every evening | ORAL | Status: AC

## 2012-01-03 NOTE — Assessment & Plan Note (Signed)
Patient is not at goal at this time but is also not taking his medications. We will restart his daily medications have him followup in 2 weeks with a nurse visit. Do not think I will need to do a repeat basic metabolic panel unless something is abnormal with the one drawn today.

## 2012-01-03 NOTE — Assessment & Plan Note (Signed)
Discussed at length with patient stating that this is the worst thing he can do for his health at this time. Patient is open for trying to quit but would like to discuss this with the pharmacist. Patient will followup for pulmonary function tests as well as tobacco cessation counseling.

## 2012-01-03 NOTE — Patient Instructions (Signed)
Nice to meet you. I am refilling all your medications. Please take them as prescribed. I will get some labs today and I will call you with the results. I when she to come back in 2 weeks and have your blood pressure checked by a nurse. Please make a nurse visit at the front desk I also when she to come back and see Dr. Raymondo Band our pharmacist and had pulmonary function tests as well as talk about quit smoking.

## 2012-01-03 NOTE — Assessment & Plan Note (Signed)
Lab Results  Component Value Date   CHOL 232* 07/15/2010   HDL 53 07/15/2010   LDLCALC 158* 07/15/2010   TRIG 106 07/15/2010   CHOLHDL 4.4 Ratio 07/15/2010

## 2012-01-03 NOTE — Assessment & Plan Note (Signed)
Patient seems to be doing very well and is stable. We'll continue to use breathing as well as albuterol patient does not show any signs of needing any inhaled steroids at this point. Patient though will followup for formal pulmonary function tests as well as discuss tobacco cessation.

## 2012-01-03 NOTE — Progress Notes (Signed)
  Subjective:    Patient ID: Lucas [name], male    DOB: [birth year], 61 y.o.   MRN: [id number]  HPI 1. Hypertension Blood pressure at home: Not checking Blood pressure today: 159/80 Taking Meds: Not taking medications Side effects: No ROS: Denies headache visual changes nausea, vomiting, chest pain or abdominal pain or shortness of breath.  2. tobacco dependence-patient is still smoking has cut down to half pack per day is attempting to quit but is having trouble. Patient has done the patches before and at that time he felt like you had more anxiety as well as more of a dry to smoke. Patient continues to try to cut down on his own he would be willing to talk to somebody else. Patient declines to set a quit date at this time and declines any other information.  3. COPD-patient has been on Spirva doing very well has not had any exacerbation for multiple months even years he thinks. Patient is having no side effects from the medication does like his lungs are doing well does get short of breath after approximately 3-4 flights of stairs or 4 blocks. Uses his albuterol inhaler maybe once or twice a week. Patient does not have any formal PFTs on file.  4-on left wrist patient does have a cyst asymptomatic we'll just like it looked at. Has been there for years seems to get bigger and smaller from time to time.    Review of Systems As stated above in history of present illness    Objective:   Physical Exam General: No apparent distress appears stated age Cardiovascular: Regular rate and rhythm no murmur appreciated Pulmonary: Symmetric bilaterally no focal findings Abdomen: Bowel sounds positive nontender nondistended no organomegaly Extremities-no edema neurovascularly intact Skin: Warm and dry and intact.    Assessment & Plan:

## 2012-01-04 LAB — COMPREHENSIVE METABOLIC PANEL WITH GFR
ALT: 21 U/L (ref 0–53)
AST: 39 U/L — ABNORMAL HIGH (ref 0–37)
Albumin: 4.8 g/dL (ref 3.5–5.2)
Alkaline Phosphatase: 83 U/L (ref 39–117)
BUN: 12 mg/dL (ref 6–23)
CO2: 22 meq/L (ref 19–32)
Calcium: 9.9 mg/dL (ref 8.4–10.5)
Chloride: 103 meq/L (ref 96–112)
Creat: 0.95 mg/dL (ref 0.50–1.35)
Glucose, Bld: 64 mg/dL — ABNORMAL LOW (ref 70–99)
Potassium: 4.1 meq/L (ref 3.5–5.3)
Sodium: 136 meq/L (ref 135–145)
Total Bilirubin: 0.5 mg/dL (ref 0.3–1.2)
Total Protein: 7.8 g/dL (ref 6.0–8.3)

## 2012-01-04 LAB — LIPID PANEL
Cholesterol: 221 mg/dL — ABNORMAL HIGH (ref 0–200)
HDL: 51 mg/dL (ref >39)

## 2012-01-06 ENCOUNTER — Emergency Department (HOSPITAL_COMMUNITY): Payer: Medicare Other

## 2012-01-06 ENCOUNTER — Inpatient Hospital Stay (HOSPITAL_COMMUNITY)
Admission: EM | Admit: 2012-01-06 | Discharge: 2012-01-13 | DRG: 493 | Disposition: A | Discharge status: Home Health | Payer: Medicare Other | Attending: Orthopedic Surgery | Admitting: Orthopedic Surgery

## 2012-01-06 ENCOUNTER — Encounter (HOSPITAL_COMMUNITY): Payer: Self-pay | Admitting: Anesthesiology

## 2012-01-06 ENCOUNTER — Emergency Department (HOSPITAL_COMMUNITY): Payer: Medicare Other | Admitting: Anesthesiology

## 2012-01-06 ENCOUNTER — Encounter (HOSPITAL_COMMUNITY): Payer: Self-pay | Admitting: Emergency Medicine

## 2012-01-06 ENCOUNTER — Encounter (HOSPITAL_COMMUNITY)
Admission: EM | Disposition: A | Discharge status: Home Health | Payer: Self-pay | Source: Home / Self Care | Attending: Orthopedic Surgery

## 2012-01-06 ENCOUNTER — Other Ambulatory Visit: Payer: Self-pay

## 2012-01-06 DIAGNOSIS — F17200 Nicotine dependence, unspecified, uncomplicated: Secondary | ICD-10-CM | POA: Diagnosis present

## 2012-01-06 DIAGNOSIS — F1020 Alcohol dependence, uncomplicated: Secondary | ICD-10-CM | POA: Insufficient documentation

## 2012-01-06 DIAGNOSIS — S82143A Displaced bicondylar fracture of unspecified tibia, initial encounter for closed fracture: Secondary | ICD-10-CM | POA: Insufficient documentation

## 2012-01-06 DIAGNOSIS — G609 Hereditary and idiopathic neuropathy, unspecified: Secondary | ICD-10-CM | POA: Insufficient documentation

## 2012-01-06 DIAGNOSIS — J449 Chronic obstructive pulmonary disease, unspecified: Secondary | ICD-10-CM | POA: Insufficient documentation

## 2012-01-06 DIAGNOSIS — I10 Essential (primary) hypertension: Secondary | ICD-10-CM | POA: Insufficient documentation

## 2012-01-06 DIAGNOSIS — Y92410 Unspecified street and highway as the place of occurrence of the external cause: Secondary | ICD-10-CM

## 2012-01-06 DIAGNOSIS — Z720 Tobacco use: Secondary | ICD-10-CM | POA: Insufficient documentation

## 2012-01-06 DIAGNOSIS — J301 Allergic rhinitis due to pollen: Secondary | ICD-10-CM

## 2012-01-06 DIAGNOSIS — Z79899 Other long term (current) drug therapy: Secondary | ICD-10-CM

## 2012-01-06 DIAGNOSIS — S82202A Unspecified fracture of shaft of left tibia, initial encounter for closed fracture: Secondary | ICD-10-CM

## 2012-01-06 DIAGNOSIS — S82209A Unspecified fracture of shaft of unspecified tibia, initial encounter for closed fracture: Secondary | ICD-10-CM | POA: Diagnosis present

## 2012-01-06 DIAGNOSIS — J4489 Other specified chronic obstructive pulmonary disease: Secondary | ICD-10-CM | POA: Insufficient documentation

## 2012-01-06 DIAGNOSIS — T79A29A Traumatic compartment syndrome of unspecified lower extremity, initial encounter: Secondary | ICD-10-CM | POA: Diagnosis present

## 2012-01-06 DIAGNOSIS — S82109A Unspecified fracture of upper end of unspecified tibia, initial encounter for closed fracture: Principal | ICD-10-CM | POA: Diagnosis present

## 2012-01-06 DIAGNOSIS — E785 Hyperlipidemia, unspecified: Secondary | ICD-10-CM | POA: Diagnosis present

## 2012-01-06 DIAGNOSIS — Z880 Allergy status to penicillin: Secondary | ICD-10-CM

## 2012-01-06 DIAGNOSIS — E876 Hypokalemia: Secondary | ICD-10-CM

## 2012-01-06 DIAGNOSIS — Z888 Allergy status to other drugs, medicaments and biological substances status: Secondary | ICD-10-CM

## 2012-01-06 HISTORY — DX: Other allergy status, other than to drugs and biological substances: Z91.09

## 2012-01-06 HISTORY — PX: EXTERNAL FIXATION LEG: SHX1549

## 2012-01-06 HISTORY — PX: FASCIOTOMY: SHX132

## 2012-01-06 HISTORY — PX: APPLICATION OF WOUND VAC: SHX5189

## 2012-01-06 LAB — URINALYSIS, ROUTINE W REFLEX MICROSCOPIC
Bilirubin Urine: NEGATIVE
Glucose, UA: NEGATIVE mg/dL
Ketones, ur: NEGATIVE mg/dL
Leukocytes, UA: NEGATIVE
Nitrite: NEGATIVE
Protein, ur: NEGATIVE mg/dL

## 2012-01-06 LAB — DIFFERENTIAL
Basophils Absolute: 0 10*3/uL (ref 0.0–0.1)
Basophils Relative: 0 % (ref 0–1)
Eosinophils Absolute: 0 10*3/uL (ref 0.0–0.7)
Eosinophils Relative: 0 % (ref 0–5)
Monocytes Absolute: 0.5 10*3/uL (ref 0.1–1.0)
Monocytes Relative: 6 % (ref 3–12)

## 2012-01-06 LAB — CBC
HCT: 38.2 % — ABNORMAL LOW (ref 39.0–52.0)
MCH: 31.1 pg (ref 26.0–34.0)
MCHC: 34.3 g/dL (ref 30.0–36.0)
MCV: 90.7 fL (ref 78.0–100.0)
RDW: 12.7 % (ref 11.5–15.5)

## 2012-01-06 LAB — BASIC METABOLIC PANEL
BUN: 8 mg/dL (ref 6–23)
Calcium: 8.9 mg/dL (ref 8.4–10.5)
Creatinine, Ser: 0.85 mg/dL (ref 0.50–1.35)
GFR calc Af Amer: >90 mL/min (ref >90)

## 2012-01-06 SURGERY — EXTERNAL FIXATION, LOWER EXTREMITY
Anesthesia: General | Site: Leg Lower | Laterality: Left | Wound class: Clean

## 2012-01-06 MED ORDER — POTASSIUM CHLORIDE IN NACL 20-0.9 MEQ/L-% IV SOLN
INTRAVENOUS | Status: DC
  Administered 2012-01-06: 21:00:00 via INTRAVENOUS
  Filled 2012-01-06 (×3): qty 1000

## 2012-01-06 MED ORDER — ONDANSETRON HCL 4 MG/2ML IJ SOLN
4.0000 mg | Freq: Once | INTRAMUSCULAR | Status: AC
  Administered 2012-01-06: 4 mg via INTRAVENOUS
  Filled 2012-01-06: qty 2

## 2012-01-06 MED ORDER — LACTATED RINGERS IV SOLN
INTRAVENOUS | Status: DC | PRN
  Administered 2012-01-06 (×2): via INTRAVENOUS

## 2012-01-06 MED ORDER — CHLORHEXIDINE GLUCONATE 4 % EX LIQD
60.0000 mL | Freq: Once | CUTANEOUS | Status: DC
  Filled 2012-01-06: qty 60

## 2012-01-06 MED ORDER — PROPOFOL 10 MG/ML IV BOLUS
INTRAVENOUS | Status: DC | PRN
  Administered 2012-01-06: 150 mg via INTRAVENOUS
  Administered 2012-01-06: 100 mg via INTRAVENOUS

## 2012-01-06 MED ORDER — CEFAZOLIN SODIUM 1-5 GM-% IV SOLN
INTRAVENOUS | Status: AC
  Filled 2012-01-06: qty 100

## 2012-01-06 MED ORDER — INFLUENZA VIRUS VACC SPLIT PF IM SUSP
0.5000 mL | INTRAMUSCULAR | Status: AC
  Administered 2012-01-07: 0.5 mL via INTRAMUSCULAR
  Filled 2012-01-06 (×2): qty 0.5

## 2012-01-06 MED ORDER — FENTANYL CITRATE 0.05 MG/ML IJ SOLN
INTRAMUSCULAR | Status: DC | PRN
  Administered 2012-01-06: 100 ug via INTRAVENOUS
  Administered 2012-01-06: 50 ug via INTRAVENOUS
  Administered 2012-01-06: 100 ug via INTRAVENOUS
  Administered 2012-01-07 (×2): 50 ug via INTRAVENOUS

## 2012-01-06 MED ORDER — HYDROMORPHONE HCL PF 1 MG/ML IJ SOLN
1.0000 mg | Freq: Once | INTRAMUSCULAR | Status: AC
  Administered 2012-01-06: 1 mg via INTRAVENOUS
  Filled 2012-01-06: qty 1

## 2012-01-06 MED ORDER — LIDOCAINE HCL (CARDIAC) 20 MG/ML IV SOLN
INTRAVENOUS | Status: DC | PRN
  Administered 2012-01-06: 75 mg via INTRAVENOUS

## 2012-01-06 MED ORDER — CEFAZOLIN SODIUM-DEXTROSE 2-3 GM-% IV SOLR
2.0000 g | INTRAVENOUS | Status: AC
  Administered 2012-01-06: 2 g via INTRAVENOUS

## 2012-01-06 SURGICAL SUPPLY — 59 items
BAG SPEC THK2 15X12 ZIP CLS (MISCELLANEOUS) ×2
BAG ZIPLOCK 12X15 (MISCELLANEOUS) ×4 IMPLANT
BANDAGE ELASTIC 4 VELCRO ST LF (GAUZE/BANDAGES/DRESSINGS) ×3 IMPLANT
BANDAGE ELASTIC 6 VELCRO ST LF (GAUZE/BANDAGES/DRESSINGS) ×2 IMPLANT
BANDAGE ESMARK 6X9 LF (GAUZE/BANDAGES/DRESSINGS) ×1 IMPLANT
BANDAGE GAUZE ELAST BULKY 4 IN (GAUZE/BANDAGES/DRESSINGS) ×1 IMPLANT
BAR EXFIX SM BONE 10.5X350 (Trauma) ×2 IMPLANT
BAR EXFIX SM BONE 10.5X400 (Trauma) ×2 IMPLANT
BAR EXFX 400 NS DISP CFBR (Trauma) ×2 IMPLANT
BNDG CMPR 9X6 STRL LF SNTH (GAUZE/BANDAGES/DRESSINGS)
BNDG COHESIVE 6X5 TAN STRL LF (GAUZE/BANDAGES/DRESSINGS) ×1 IMPLANT
BNDG ESMARK 6X9 LF (GAUZE/BANDAGES/DRESSINGS)
CLAMP BAR TO BAR (Clamp) ×4 IMPLANT
CLAMP PIN TO BAR (Clamp) ×8 IMPLANT
CLEANER TIP ELECTROSURG 2X2 (MISCELLANEOUS) ×1 IMPLANT
CLOTH BEACON ORANGE TIMEOUT ST (SAFETY) ×2 IMPLANT
COVER SURGICAL LIGHT HANDLE (MISCELLANEOUS) ×1 IMPLANT
CUFF TOURN SGL QUICK 34 (TOURNIQUET CUFF)
CUFF TRNQT CYL 34X4X40X1 (TOURNIQUET CUFF) ×1 IMPLANT
DRAPE C-ARM 42X72 X-RAY (DRAPES) ×2 IMPLANT
DRAPE U-SHAPE 47X51 STRL (DRAPES) ×2 IMPLANT
DRSG ADAPTIC 3X8 NADH LF (GAUZE/BANDAGES/DRESSINGS) ×1 IMPLANT
DRSG PAD ABDOMINAL 8X10 ST (GAUZE/BANDAGES/DRESSINGS) ×3 IMPLANT
DRSG VAC ATS MED SENSATRAC (GAUZE/BANDAGES/DRESSINGS) ×1 IMPLANT
ELECT REM PT RETURN 9FT ADLT (ELECTROSURGICAL) ×2
ELECTRODE REM PT RTRN 9FT ADLT (ELECTROSURGICAL) ×1 IMPLANT
EVACUATOR 1/8 PVC DRAIN (DRAIN) ×1 IMPLANT
EVACUATOR SILICONE 100CC (DRAIN) ×1 IMPLANT
GAUZE XEROFORM 5X9 LF (GAUZE/BANDAGES/DRESSINGS) ×1 IMPLANT
GLOVE SURG ORTHO 8.0 STRL STRW (GLOVE) ×6 IMPLANT
GOWN STRL NON-REIN LRG LVL3 (GOWN DISPOSABLE) ×4 IMPLANT
HALF PIN 45MM (Pin) ×2 IMPLANT
HALF PIN LONG  5X45 (Pin) ×2 IMPLANT
KIT BASIN OR (CUSTOM PROCEDURE TRAY) ×2 IMPLANT
NEEDLE HYPO 22GX1.5 SAFETY (NEEDLE) ×1 IMPLANT
NS IRRIG 1000ML POUR BTL (IV SOLUTION) ×3 IMPLANT
PACK LOWER EXTREMITY WL (CUSTOM PROCEDURE TRAY) ×2 IMPLANT
PAD CAST 4YDX4 CTTN HI CHSV (CAST SUPPLIES) IMPLANT
PADDING CAST COTTON 4X4 STRL (CAST SUPPLIES) ×6
PADDING CAST COTTON 6X4 STRL (CAST SUPPLIES) ×2 IMPLANT
POSITIONER SURGICAL ARM (MISCELLANEOUS) ×2 IMPLANT
SET MONITOR QUICK PRESSURE (MISCELLANEOUS) ×1 IMPLANT
SOAP 2 % CHG 4 OZ (WOUND CARE) ×1 IMPLANT
SOL PREP POV-IOD 16OZ 10% (MISCELLANEOUS) ×1 IMPLANT
SOL PREP PROV IODINE SCRUB 4OZ (MISCELLANEOUS) ×1 IMPLANT
SPONGE GAUZE 4X4 12PLY (GAUZE/BANDAGES/DRESSINGS) ×5 IMPLANT
SPONGE LAP 18X18 X RAY DECT (DISPOSABLE) ×4 IMPLANT
STAPLER VISISTAT 35W (STAPLE) ×2 IMPLANT
STOCKINETTE 8 INCH (MISCELLANEOUS) ×2 IMPLANT
STRIP CLOSURE SKIN 1/2X4 (GAUZE/BANDAGES/DRESSINGS) ×1 IMPLANT
SUCTION FRAZIER TIP 10 FR DISP (SUCTIONS) ×1 IMPLANT
SUT ETHILON 3 0 PS 1 (SUTURE) ×4 IMPLANT
SUT VIC AB 2-0 CT1 27 (SUTURE)
SUT VIC AB 2-0 CT1 TAPERPNT 27 (SUTURE) ×2 IMPLANT
SUT VIC AB 3-0 CT1 36 (SUTURE) ×1 IMPLANT
SYR CONTROL 10ML LL (SYRINGE) ×1 IMPLANT
TOWEL OR 17X26 10 PK STRL BLUE (TOWEL DISPOSABLE) ×5 IMPLANT
WATER STERILE IRR 1500ML POUR (IV SOLUTION) ×1 IMPLANT
YANKAUER SUCT BULB TIP 10FT TU (MISCELLANEOUS) ×1 IMPLANT

## 2012-01-06 NOTE — Anesthesia Preprocedure Evaluation (Addendum)
Anesthesia Evaluation  Patient identified by MRN, date of birth, ID band Patient awake    Reviewed: Allergy & Precautions, H&P , NPO status , Patient's Chart, lab work & pertinent test results  Airway Mallampati: II TM Distance: >3 FB Neck ROM: full    Dental  (+) Edentulous Upper, Missing and Dental Advisory Given Many missing teeth lower too:   Pulmonary neg pulmonary ROS, COPD COPD inhaler,  breath sounds clear to auscultation  Pulmonary exam normal       Cardiovascular Exercise Tolerance: Good hypertension, Pt. on medications negative cardio ROS  Rhythm:regular Rate:Normal     Neuro/Psych negative neurological ROS  negative psych ROS   GI/Hepatic negative GI ROS, Neg liver ROS,   Endo/Other  negative endocrine ROS  Renal/GU negative Renal ROS  negative genitourinary   Musculoskeletal   Abdominal   Peds  Hematology negative hematology ROS (+)   Anesthesia Other Findings   Reproductive/Obstetrics negative OB ROS                          Anesthesia Physical Anesthesia Plan  ASA: III  Anesthesia Plan: General   Post-op Pain Management:    Induction: Intravenous  Airway Management Planned: LMA  Additional Equipment:   Intra-op Plan:   Post-operative Plan:   Informed Consent: I have reviewed the patients History and Physical, chart, labs and discussed the procedure including the risks, benefits and alternatives for the proposed anesthesia with the patient or authorized representative who has indicated his/her understanding and acceptance.   Dental Advisory Given  Plan Discussed with: CRNA and Surgeon  Anesthesia Plan Comments:         Anesthesia Quick Evaluation
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
## 2012-01-06 NOTE — ED Provider Notes (Signed)
Medical screening examination/treatment/procedure(s) were conducted as a shared visit with non-physician practitioner(s) and myself.  I personally evaluated the patient during the encounter   Destiny Trickey A. Patrica Duel, MD 01/06/12 [id number]
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
## 2012-01-06 NOTE — ED Notes (Signed)
Patient aware of need for urine specimen. Patient unable to void at this time. Patient given urinal. Encouraged to call for assistance if needed.   
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
## 2012-01-06 NOTE — ED Notes (Signed)
Pt taken to OR.
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
## 2012-01-07 ENCOUNTER — Emergency Department (HOSPITAL_COMMUNITY): Payer: Medicare Other

## 2012-01-07 ENCOUNTER — Encounter (HOSPITAL_COMMUNITY): Payer: Self-pay | Admitting: *Deleted

## 2012-01-07 LAB — PROTIME-INR
INR: 0.97 (ref 0.00–1.49)
Prothrombin Time: 13.1 seconds (ref 11.6–15.2)

## 2012-01-07 MED ORDER — LACTATED RINGERS IV SOLN: INTRAVENOUS | Status: DC

## 2012-01-07 MED ORDER — ATORVASTATIN CALCIUM 80 MG PO TABS
80.0000 mg | ORAL_TABLET | Freq: Every day | ORAL | Status: DC
  Administered 2012-01-07 – 2012-01-13 (×6): 80 mg via ORAL
  Filled 2012-01-07 (×8): qty 1

## 2012-01-07 MED ORDER — AMLODIPINE BESYLATE 10 MG PO TABS
10.0000 mg | ORAL_TABLET | Freq: Every day | ORAL | Status: DC
  Administered 2012-01-07 – 2012-01-13 (×6): 10 mg via ORAL
  Filled 2012-01-07 (×8): qty 1

## 2012-01-07 MED ORDER — MUPIROCIN CALCIUM 2 % EX CREA
TOPICAL_CREAM | CUTANEOUS | Status: DC | PRN
  Administered 2012-01-06: 1 via TOPICAL

## 2012-01-07 MED ORDER — LEVOCETIRIZINE DIHYDROCHLORIDE 5 MG PO TABS: 5.0000 mg | ORAL_TABLET | Freq: Every evening | ORAL | Status: DC

## 2012-01-07 MED ORDER — WARFARIN VIDEO
Freq: Once | Status: AC
  Administered 2012-01-07: 13:00:00

## 2012-01-07 MED ORDER — 0.9 % SODIUM CHLORIDE (POUR BTL) OPTIME
TOPICAL | Status: DC | PRN
  Administered 2012-01-06: 1000 mL

## 2012-01-07 MED ORDER — WARFARIN SODIUM 7.5 MG PO TABS
7.5000 mg | ORAL_TABLET | Freq: Once | ORAL | Status: AC
  Administered 2012-01-07: 7.5 mg via ORAL
  Filled 2012-01-07 (×2): qty 1

## 2012-01-07 MED ORDER — HYDROMORPHONE HCL PF 1 MG/ML IJ SOLN
INTRAMUSCULAR | Status: AC
  Administered 2012-01-07: 0.5 mg via INTRAVENOUS
  Filled 2012-01-07: qty 1

## 2012-01-07 MED ORDER — LORAZEPAM 1 MG PO TABS: 0.0000 mg | ORAL_TABLET | Freq: Two times a day (BID) | ORAL | Status: AC

## 2012-01-07 MED ORDER — ONDANSETRON HCL 4 MG PO TABS: 4.0000 mg | ORAL_TABLET | Freq: Four times a day (QID) | ORAL | Status: DC | PRN

## 2012-01-07 MED ORDER — MORPHINE SULFATE 2 MG/ML IJ SOLN
1.0000 mg | INTRAMUSCULAR | Status: DC | PRN
  Administered 2012-01-07 (×4): 1 mg via INTRAVENOUS
  Filled 2012-01-07 (×3): qty 1

## 2012-01-07 MED ORDER — ADULT MULTIVITAMIN W/MINERALS CH
1.0000 | ORAL_TABLET | Freq: Every day | ORAL | Status: DC
  Administered 2012-01-07 – 2012-01-13 (×6): 1 via ORAL
  Filled 2012-01-07 (×8): qty 1

## 2012-01-07 MED ORDER — THIAMINE HCL 100 MG/ML IJ SOLN
100.0000 mg | Freq: Every day | INTRAMUSCULAR | Status: DC
  Filled 2012-01-07 (×7): qty 1

## 2012-01-07 MED ORDER — ALBUTEROL SULFATE HFA 108 (90 BASE) MCG/ACT IN AERS
2.0000 | INHALATION_SPRAY | RESPIRATORY_TRACT | Status: DC | PRN
  Administered 2012-01-07 – 2012-01-13 (×14): 2 via RESPIRATORY_TRACT
  Filled 2012-01-07 (×2): qty 6.7

## 2012-01-07 MED ORDER — FLUTICASONE PROPIONATE 50 MCG/ACT NA SUSP
2.0000 | Freq: Every day | NASAL | Status: DC
  Administered 2012-01-07 – 2012-01-13 (×5): 2 via NASAL
  Filled 2012-01-07 (×2): qty 16

## 2012-01-07 MED ORDER — TIOTROPIUM BROMIDE MONOHYDRATE 18 MCG IN CAPS
18.0000 ug | ORAL_CAPSULE | Freq: Every day | RESPIRATORY_TRACT | Status: DC
  Administered 2012-01-07 – 2012-01-13 (×7): 18 ug via RESPIRATORY_TRACT
  Filled 2012-01-07 (×2): qty 5

## 2012-01-07 MED ORDER — POTASSIUM CHLORIDE IN NACL 20-0.9 MEQ/L-% IV SOLN
INTRAVENOUS | Status: DC
  Administered 2012-01-07: 1000 mL via INTRAVENOUS
  Administered 2012-01-09: 11:00:00 via INTRAVENOUS
  Filled 2012-01-07 (×3): qty 1000

## 2012-01-07 MED ORDER — PATIENT'S GUIDE TO USING COUMADIN BOOK
Freq: Once | Status: AC
  Administered 2012-01-07: 15:00:00
  Filled 2012-01-07 (×2): qty 1

## 2012-01-07 MED ORDER — ONDANSETRON HCL 4 MG/2ML IJ SOLN
INTRAMUSCULAR | Status: DC | PRN
  Administered 2012-01-07: 4 mg via INTRAVENOUS

## 2012-01-07 MED ORDER — METHOCARBAMOL 100 MG/ML IJ SOLN
500.0000 mg | Freq: Four times a day (QID) | INTRAVENOUS | Status: DC | PRN
  Filled 2012-01-07: qty 5

## 2012-01-07 MED ORDER — OXYCODONE HCL 5 MG PO TABS
5.0000 mg | ORAL_TABLET | ORAL | Status: DC | PRN
  Administered 2012-01-07 – 2012-01-13 (×17): 10 mg via ORAL
  Filled 2012-01-07 (×17): qty 2

## 2012-01-07 MED ORDER — LORAZEPAM 1 MG PO TABS
1.0000 mg | ORAL_TABLET | Freq: Four times a day (QID) | ORAL | Status: AC | PRN
  Administered 2012-01-07: 1 mg via ORAL
  Filled 2012-01-07: qty 1

## 2012-01-07 MED ORDER — HYDROCHLOROTHIAZIDE 25 MG PO TABS
25.0000 mg | ORAL_TABLET | Freq: Every day | ORAL | Status: DC
  Administered 2012-01-07 – 2012-01-13 (×6): 25 mg via ORAL
  Filled 2012-01-07 (×8): qty 1

## 2012-01-07 MED ORDER — WARFARIN - PHARMACIST DOSING INPATIENT: Freq: Every day | Status: DC

## 2012-01-07 MED ORDER — VITAMIN B-1 100 MG PO TABS
100.0000 mg | ORAL_TABLET | Freq: Every day | ORAL | Status: DC
  Administered 2012-01-07 – 2012-01-13 (×6): 100 mg via ORAL
  Filled 2012-01-07 (×8): qty 1

## 2012-01-07 MED ORDER — CLINDAMYCIN PHOSPHATE 600 MG/50ML IV SOLN
600.0000 mg | Freq: Four times a day (QID) | INTRAVENOUS | Status: AC
  Administered 2012-01-07 (×3): 600 mg via INTRAVENOUS
  Filled 2012-01-07 (×3): qty 50

## 2012-01-07 MED ORDER — HYDROMORPHONE HCL PF 1 MG/ML IJ SOLN
0.2500 mg | INTRAMUSCULAR | Status: DC | PRN
  Administered 2012-01-07 (×2): 0.5 mg via INTRAVENOUS

## 2012-01-07 MED ORDER — METHOCARBAMOL 500 MG PO TABS
500.0000 mg | ORAL_TABLET | Freq: Four times a day (QID) | ORAL | Status: DC | PRN
  Administered 2012-01-07 – 2012-01-13 (×8): 500 mg via ORAL
  Filled 2012-01-07 (×8): qty 1

## 2012-01-07 MED ORDER — LORAZEPAM 2 MG/ML IJ SOLN: 1.0000 mg | Freq: Four times a day (QID) | INTRAMUSCULAR | Status: AC | PRN

## 2012-01-07 MED ORDER — FOLIC ACID 1 MG PO TABS
1.0000 mg | ORAL_TABLET | Freq: Every day | ORAL | Status: DC
  Administered 2012-01-07 – 2012-01-13 (×6): 1 mg via ORAL
  Filled 2012-01-07 (×8): qty 1

## 2012-01-07 MED ORDER — LORAZEPAM 1 MG PO TABS
0.0000 mg | ORAL_TABLET | Freq: Four times a day (QID) | ORAL | Status: AC
Start: 2012-01-07 — End: 2012-01-09
  Administered 2012-01-08: 1 mg via ORAL
  Filled 2012-01-07: qty 1

## 2012-01-07 MED ORDER — METOCLOPRAMIDE HCL 10 MG PO TABS
5.0000 mg | ORAL_TABLET | Freq: Three times a day (TID) | ORAL | Status: DC | PRN
  Administered 2012-01-13: 10 mg via ORAL
  Filled 2012-01-07: qty 1

## 2012-01-07 MED ORDER — LORATADINE 10 MG PO TABS
10.0000 mg | ORAL_TABLET | Freq: Every day | ORAL | Status: DC
  Administered 2012-01-07 – 2012-01-12 (×6): 10 mg via ORAL
  Filled 2012-01-07 (×8): qty 1

## 2012-01-07 MED ORDER — ONDANSETRON HCL 4 MG/2ML IJ SOLN: 4.0000 mg | Freq: Four times a day (QID) | INTRAMUSCULAR | Status: DC | PRN

## 2012-01-07 MED ORDER — METOCLOPRAMIDE HCL 5 MG/ML IJ SOLN
5.0000 mg | Freq: Three times a day (TID) | INTRAMUSCULAR | Status: DC | PRN
  Administered 2012-01-07: 10 mg via INTRAVENOUS
  Filled 2012-01-07: qty 2

## 2012-01-07 NOTE — Progress Notes (Signed)
Subjective: 1 Day Post-Op Procedure(s) (LRB): EXTERNAL FIXATION LEG (Left) APPLICATION OF WOUND VAC (Left) FASCIOTOMY (Left) Patient reports pain as moderate.    Objective: Vital signs in last 24 hours: Temp:  [97.7 F (36.5 C)-98.5 F (36.9 C)] 97.7 F (36.5 C) (03/16 0531) Pulse Rate:  [73-93] 87  (03/16 0531) Resp:  [12-20] 16  (03/16 0531) BP: (135-179)/(78-100) 162/99 mmHg (03/16 0531) SpO2:  [97 %-100 %] 97 % (03/16 0802) Weight:  [70.3 kg (154 lb 15.7 oz)] 70.3 kg (154 lb 15.7 oz) (03/16 0404)  Intake/Output from previous day: 03/15 0701 - 03/16 0700 In: 2060 [P.O.:60; I.V.:2000] Out: 300 [Urine:250; Blood:50] Intake/Output this shift: Total I/O In: 380 [I.V.:330; IV Piggyback:50] Out: -    Basename 01/06/12 1845  HGB 13.1    Basename 01/06/12 1845  WBC 7.8  RBC 4.21*  HCT 38.2*  PLT 201    Basename 01/06/12 1845  NA 138  K 5.2*  CL 106  CO2 23  BUN 8  CREATININE 0.85  GLUCOSE 102*  CALCIUM 8.9    Basename 01/07/12 0605  LABPT --  INR 0.97    Sensation intact distally Intact pulses distally Compartment soft  Assessment/Plan: 1 Day Post-Op Procedure(s) (LRB): EXTERNAL FIXATION LEG (Left) APPLICATION OF WOUND VAC (Left) FASCIOTOMY (Left) Up with therapy NWB left leg. Monitor for etoh withdrawal Continue VAC on fasciotomy sites. Will need more surgery. DVT proph.  Kathryne Hitch 01/07/2012, 8:43 AM

## 2012-01-07 NOTE — Anesthesia Postprocedure Evaluation (Signed)
  Anesthesia Post-op Note  Patient: Lucas [name]  Procedure(s) Performed: Procedure(s) (LRB): EXTERNAL FIXATION LEG (Left) APPLICATION OF WOUND VAC (Left) FASCIOTOMY (Left)  Patient Location: PACU  Anesthesia Type: General  Level of Consciousness: awake and alert   Airway and Oxygen Therapy: Patient Spontanous Breathing  Post-op Pain: mild  Post-op Assessment: Post-op Vital signs reviewed, Patient's Cardiovascular Status Stable, Respiratory Function Stable, Patent Airway and No signs of Nausea or vomiting  Post-op Vital Signs: stable  Complications: No apparent anesthesia complications

## 2012-01-07 NOTE — Transfer of Care (Signed)
Immediate Anesthesia Transfer of Care Note  Patient: Lucas [name]  Procedure(s) Performed: Procedure(s) (LRB): EXTERNAL FIXATION LEG (Left) APPLICATION OF WOUND VAC (Left) FASCIOTOMY (Left)  Patient Location: PACU  Anesthesia Type: General  Level of Consciousness: awake, sedated and patient cooperative  Airway & Oxygen Therapy: Patient Spontanous Breathing and Patient connected to face mask oxygen  Post-op Assessment: Report given to PACU RN and Post -op Vital signs reviewed and stable  Post vital signs: Reviewed and stable  Complications: No apparent anesthesia complications
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
## 2012-01-07 NOTE — Progress Notes (Signed)
Physical Therapy Treatment Patient Details Name: Lucas [name] MRN: [id number] DOB: 1950-12-15 Today's Date: 01/07/2012  PT Assessment/Plan  PT - Assessment/Plan PT Plan: Discharge plan remains appropriate PT Frequency: Min 6X/week Recommendations for Other Services: OT consult Follow Up Recommendations: Home health PT;Skilled nursing facility Equipment Recommended: Rolling walker with 5" wheels;Wheelchair (measurements) PT Goals  Acute Rehab PT Goals PT Goal Formulation: With patient Time For Goal Achievement: 7 days Pt will go Supine/Side to Sit: with supervision PT Goal: Supine/Side to Sit - Progress: Goal set today Pt will go Sit to Supine/Side: with supervision PT Goal: Sit to Supine/Side - Progress: Goal set today Pt will go Sit to Stand: with supervision PT Goal: Sit to Stand - Progress: Goal set today Pt will go Stand to Sit: with supervision PT Goal: Stand to Sit - Progress: Goal set today Pt will Ambulate: 16 - 50 feet;with supervision;with rolling walker PT Goal: Ambulate - Progress: Goal set today Pt will Go Up / Down Stairs: 1-2 stairs;with least restrictive assistive device;with min assist PT Goal: Up/Down Stairs - Progress: Goal set today  PT Treatment Precautions/Restrictions  Precautions Precautions: Fall Restrictions Weight Bearing Restrictions: Yes LLE Weight Bearing: Non weight bearing Other Position/Activity Restrictions: pt with wound vac and external fixator in place Mobility (including Balance) Bed Mobility Sit to Supine: 3: Mod assist Sit to Supine - Details (indicate cue type and reason): cues for sequence and  self assist with UEs Transfers Sit to Stand: 3: Mod assist Sit to Stand Details (indicate cue type and reason): cues for use of UEs and for LE management Stand to Sit: 3: Mod assist Stand to Sit Details: cues for use of UEs and for LE management Ambulation/Gait Ambulation/Gait Assistance: 3: Mod assist Ambulation/Gait Assistance Details  (indicate cue type and reason): cues for sequence, posture and position from RW.  Pt follows NWB with min difficulty Ambulation Distance (Feet): 15 Feet Assistive device: Rolling walker Gait Pattern: Step-to pattern    Exercise    End of Session PT - End of Session Equipment Utilized During Treatment: Gait belt Activity Tolerance: Patient tolerated treatment well Patient left: in bed;with call bell in reach Nurse Communication: Mobility status for transfers;Mobility status for ambulation General Behavior During Session: [facility] for tasks performed Cognition: [facility] for tasks performed  Lucas [name] 01/07/2012, 5:04 PM

## 2012-01-07 NOTE — Brief Op Note (Signed)
01/06/2012  1:01 AM  PATIENT:  Lucas [name]  62 y.o. male  PRE-OPERATIVE DIAGNOSIS:  fractured left leg  POST-OPERATIVE DIAGNOSIS:  fractured left lower leg  PROCEDURE:  Procedure(s): EXTERNAL FIXATION LEG APPLICATION OF WOUND VAC FASCIOTOMY  SURGEON:  Surgeon(s): Cammy Copa, MD  ASSISTANT:   ANESTHESIA:   general  EBL: 50 ml    Total I/O In: 1000 [I.V.:1000] Out: 50 [Blood:50]  BLOOD ADMINISTERED: none  DRAINS: none   LOCAL MEDICATIONS USED:  none  SPECIMEN:  No Specimen  COUNTS:  YES  TOURNIQUET:  * No tourniquets in log *  DICTATION: .Other Dictation: Dictation Number [id number]   PLAN OF CARE: Admit to inpatient   PATIENT DISPOSITION:  PACU - hemodynamically stable
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
## 2012-01-08 LAB — PROTIME-INR: Prothrombin Time: 14.7 seconds (ref 11.6–15.2)

## 2012-01-08 MED ORDER — WARFARIN SODIUM 7.5 MG PO TABS
7.5000 mg | ORAL_TABLET | Freq: Once | ORAL | Status: AC
  Administered 2012-01-08: 7.5 mg via ORAL
  Filled 2012-01-08: qty 1

## 2012-01-08 NOTE — Progress Notes (Signed)
Subjective: 2 Days Post-Op Procedure(s) (LRB): EXTERNAL FIXATION LEG (Left) APPLICATION OF WOUND VAC (Left) FASCIOTOMY (Left) Patient reports pain as moderate.    Objective: Vital signs in last 24 hours: Temp:  [97.9 F (36.6 C)-99.5 F (37.5 C)] 97.9 F (36.6 C) (03/17 0445) Pulse Rate:  [93-115] 115  (03/17 0451) Resp:  [16-20] 20  (03/17 0451) BP: (150-157)/(83-86) 151/84 mmHg (03/17 0445) SpO2:  [95 %-98 %] 97 % (03/17 0810)  Intake/Output from previous day: 03/16 0701 - 03/17 0700 In: 1186.7 [P.O.:720; I.V.:416.7; IV Piggyback:50] Out: 1950 [Urine:1950] Intake/Output this shift: Total I/O In: 480 [P.O.:480] Out: 400 [Urine:400]   Basename 01/06/12 1845  HGB 13.1    Basename 01/06/12 1845  WBC 7.8  RBC 4.21*  HCT 38.2*  PLT 201    Basename 01/06/12 1845  NA 138  K 5.2*  CL 106  CO2 23  BUN 8  CREATININE 0.85  GLUCOSE 102*  CALCIUM 8.9    Basename 01/08/12 0447 01/07/12 0605  LABPT -- --  INR 1.13 0.97    Neurovascular intact Sensation intact distally Intact pulses distally Compartment soft Vac with good seal  Assessment/Plan: 2 Days Post-Op Procedure(s) (LRB): EXTERNAL FIXATION LEG (Left) APPLICATION OF WOUND VAC (Left) FASCIOTOMY (Left) Up with therapy NWB left leg Will need fasciotomy sites eventually addressed and eventual fixation of his tibial plateau.  Jamaal Bernasconi Y 01/08/2012, 10:19 AM

## 2012-01-08 NOTE — Progress Notes (Signed)
Physical Therapy Treatment Patient Details Name: CEASAR DECANDIA MRN: [id number] DOB: Aug 02, 1951 Today's Date: 01/08/2012  PT Assessment/Plan  PT - Assessment/Plan Comments on Treatment Session: Pt reports he is unable to tolerate OOB today 2* increased L LE pain with movement so performed strengthening exercises for R LE in supine PT Plan: Discharge plan remains appropriate Follow Up Recommendations: Skilled nursing facility Equipment Recommended: Defer to next venue PT Goals  Acute Rehab PT Goals PT Goal: Supine/Side to Sit - Progress: Not progressing (today 2* pain) Pt will Perform Home Exercise Program: with supervision, verbal cues required/provided PT Goal: Perform Home Exercise Program - Progress: Goal set today  PT Treatment Precautions/Restrictions  Precautions Precautions: Fall Restrictions Weight Bearing Restrictions: Yes LLE Weight Bearing: Non weight bearing Other Position/Activity Restrictions: pt with wound vac and external fixator in place Mobility (including Balance) Bed Mobility Bed Mobility: Yes Supine to Sit: 3: Mod assist Supine to Sit Details (indicate cue type and reason): attempted however halfway through transfer pt reporting too much pain to get OOB today and requested trying to ambulate tomorrow    Exercise  General Exercises - Lower Extremity Ankle Circles/Pumps: AROM;20 reps;Supine;Right Short Arc Quad: AROM;Strengthening;Other reps (comment);Supine (20 reps) Heel Slides: AROM;Strengthening;20 reps;Supine;Right Hip ABduction/ADduction: AROM;Strengthening;Right;20 reps;Supine Straight Leg Raises: AROM;Strengthening;Right;20 reps;Supine End of Session PT - End of Session Activity Tolerance: Patient limited by pain Patient left: in bed;with call bell in reach General Behavior During Session: [facility] for tasks performed Cognition: [facility] for tasks performed  Jaymin Waln,KATHrine E 01/08/2012, 3:59 PM Pager: [phone]
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
## 2012-01-09 LAB — PROTIME-INR: INR: 1.21 (ref 0.00–1.49)

## 2012-01-09 MED ORDER — WARFARIN SODIUM 7.5 MG PO TABS
7.5000 mg | ORAL_TABLET | Freq: Once | ORAL | Status: DC
  Filled 2012-01-09: qty 1

## 2012-01-09 NOTE — Progress Notes (Signed)
CSW following to assist with d/c planning. PT is recommending ST SNF placement following hospitalization. CSW met with pt this am. Pt is presently declining SNF placement stating he plans to return home upon d/c. Pt indicated that he will have another surgery this week. Pt agreed to meet with CSW again following next procedure to see if his d/c plan has changed. CSW will follow .  Cori Razor  LCSW [phone]
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
## 2012-01-09 NOTE — Progress Notes (Signed)
Physical Therapy Treatment Patient Details Name: Lucas [name] MRN: [id number] DOB: [birth year] Today's Date: 01/09/2012  PT Assessment/Plan  PT - Assessment/Plan Comments on Treatment Session: Pt able to perform mobility with minA.  Limited ambulation distance 2* fatigue. PT Plan: Discharge plan remains appropriate Follow Up Recommendations: Skilled nursing facility Equipment Recommended: Defer to next venue PT Goals  Acute Rehab PT Goals PT Goal: Supine/Side to Sit - Progress: Progressing toward goal PT Goal: Sit to Stand - Progress: Progressing toward goal PT Goal: Stand to Sit - Progress: Progressing toward goal PT Goal: Ambulate - Progress: Progressing toward goal  PT Treatment Precautions/Restrictions  Precautions Precautions: Fall Restrictions Weight Bearing Restrictions: Yes LLE Weight Bearing: Non weight bearing Other Position/Activity Restrictions: pt with wound vac and external fixator in place Mobility (including Balance) Bed Mobility Bed Mobility: Yes Supine to Sit: 4: Min assist Supine to Sit Details (indicate cue type and reason): support for L LE Transfers Transfers: Yes Sit to Stand: 4: Min assist;From bed;With upper extremity assist Sit to Stand Details (indicate cue type and reason): min/guard, +2 for safety, verbal cues for NWB LE and hand placement Stand to Sit: 4: Min assist;To chair/3-in-1;With armrests Stand to Sit Details: min/guard, verbal cues for armrests and L LE forward Ambulation/Gait Ambulation/Gait: Yes Ambulation/Gait Assistance: 4: Min assist Ambulation/Gait Assistance Details (indicate cue type and reason): min/guard, +2 for safety, verbal cues for NWB LE (pt did well), verbal cues for RW distance (so as not to interfere with external fixator) Ambulation Distance (Feet): 30 Feet Assistive device: Rolling walker Gait Pattern: Step-to pattern    Exercise    End of Session PT - End of Session Equipment Utilized During Treatment: Gait  belt Activity Tolerance: Patient limited by pain;Patient limited by fatigue Patient left: in chair;with call bell in reach General Behavior During Session: [facility] for tasks performed Cognition: [facility] for tasks performed  [facility] E 01/09/2012, 3:58 PM Pager: [phone]

## 2012-01-09 NOTE — Progress Notes (Signed)
Subjective: Pt stable - pain controlled - no evidence of DTs   Objective: Vital signs in last 24 hours: Temp:  [98.3 F (36.8 C)-98.8 F (37.1 C)] 98.8 F (37.1 C) (03/18 0550) Pulse Rate:  [105-114] 105  (03/18 0550) Resp:  [19-20] 19  (03/18 0808) BP: (145)/(79-81) 145/81 mmHg (03/18 0550) SpO2:  [97 %-98 %] 98 % (03/18 0808)  Intake/Output from previous day: 03/17 0701 - 03/18 0700 In: 2511 [P.O.:1740; I.V.:771] Out: 2600 [Urine:2050; Drains:550] Intake/Output this shift: Total I/O In: 240 [P.O.:240] Out: 400 [Urine:400]  Exam:  Sensation intact distally Intact pulses distally Dorsiflexion/Plantar flexion intact Compartment soft  Labs:  Basename 01/06/12 1845  HGB 13.1    Basename 01/06/12 1845  WBC 7.8  RBC 4.21*  HCT 38.2*  PLT 201    Basename 01/06/12 1845  NA 138  K 5.2*  CL 106  CO2 23  BUN 8  CREATININE 0.85  GLUCOSE 102*  CALCIUM 8.9    Basename 01/09/12 0443 01/08/12 0447  LABPT -- --  INR 1.21 1.13    Assessment/Plan: Pt stable - needs vac change and orif - will d/w Handy 0 inr 1.2   Lucas [name] 01/09/2012, 11:26 AM

## 2012-01-09 NOTE — Progress Notes (Signed)
Agree with Pharmacy Student assessment/plan.  Darrol Angel, PharmD Pager: [phone] 01/09/2012 1:35 PM

## 2012-01-09 NOTE — Progress Notes (Signed)
1956  Report called to wayne on 5000 at [facility], patient aware of transfer assessment unchanged

## 2012-01-10 ENCOUNTER — Inpatient Hospital Stay (HOSPITAL_COMMUNITY): Payer: Medicare Other

## 2012-01-10 ENCOUNTER — Encounter (HOSPITAL_COMMUNITY)
Admission: EM | Disposition: A | Discharge status: Home Health | Payer: Self-pay | Source: Home / Self Care | Attending: Orthopedic Surgery

## 2012-01-10 ENCOUNTER — Inpatient Hospital Stay (HOSPITAL_COMMUNITY): Payer: Medicare Other | Admitting: Anesthesiology

## 2012-01-10 ENCOUNTER — Encounter (HOSPITAL_COMMUNITY): Payer: Self-pay | Admitting: Anesthesiology

## 2012-01-10 DIAGNOSIS — S82143A Displaced bicondylar fracture of unspecified tibia, initial encounter for closed fracture: Secondary | ICD-10-CM | POA: Insufficient documentation

## 2012-01-10 HISTORY — PX: FASCIOTOMY: SHX132

## 2012-01-10 SURGERY — FASCIOTOMY, UPPER EXTREMITY
Anesthesia: General | Site: Leg Lower | Laterality: Left | Wound class: Contaminated

## 2012-01-10 MED ORDER — MIDAZOLAM HCL 5 MG/5ML IJ SOLN
INTRAMUSCULAR | Status: DC | PRN
  Administered 2012-01-10: 2 mg via INTRAVENOUS

## 2012-01-10 MED ORDER — ONDANSETRON HCL 4 MG/2ML IJ SOLN
INTRAMUSCULAR | Status: DC | PRN
  Administered 2012-01-10: 4 mg via INTRAVENOUS

## 2012-01-10 MED ORDER — VANCOMYCIN HCL IN DEXTROSE 1-5 GM/200ML-% IV SOLN
1000.0000 mg | Freq: Once | INTRAVENOUS | Status: AC
  Administered 2012-01-10: 1000 mg via INTRAVENOUS
  Filled 2012-01-10: qty 200

## 2012-01-10 MED ORDER — ROCURONIUM BROMIDE 100 MG/10ML IV SOLN
INTRAVENOUS | Status: DC | PRN
  Administered 2012-01-10: 50 mg via INTRAVENOUS

## 2012-01-10 MED ORDER — FENTANYL CITRATE 0.05 MG/ML IJ SOLN
INTRAMUSCULAR | Status: DC | PRN
  Administered 2012-01-10: 25 ug via INTRAVENOUS
  Administered 2012-01-10: 225 ug via INTRAVENOUS

## 2012-01-10 MED ORDER — PROPOFOL 10 MG/ML IV EMUL
INTRAVENOUS | Status: DC | PRN
  Administered 2012-01-10: 200 mg via INTRAVENOUS

## 2012-01-10 MED ORDER — LACTATED RINGERS IV SOLN
INTRAVENOUS | Status: DC | PRN
  Administered 2012-01-10 (×2): via INTRAVENOUS

## 2012-01-10 MED ORDER — HYDROMORPHONE HCL PF 1 MG/ML IJ SOLN
0.2500 mg | INTRAMUSCULAR | Status: DC | PRN
Start: 2012-01-10 — End: 2012-01-10

## 2012-01-10 MED ORDER — PHENYLEPHRINE HCL 10 MG/ML IJ SOLN
INTRAMUSCULAR | Status: DC | PRN
  Administered 2012-01-10 (×2): 40 ug via INTRAVENOUS

## 2012-01-10 SURGICAL SUPPLY — 53 items
BANDAGE ELASTIC 4 VELCRO ST LF (GAUZE/BANDAGES/DRESSINGS) ×2 IMPLANT
BANDAGE ELASTIC 6 VELCRO ST LF (GAUZE/BANDAGES/DRESSINGS) ×2 IMPLANT
BANDAGE GAUZE ELAST BULKY 4 IN (GAUZE/BANDAGES/DRESSINGS) ×2 IMPLANT
BNDG COHESIVE 4X5 TAN STRL (GAUZE/BANDAGES/DRESSINGS) ×2 IMPLANT
BRUSH SCRUB DISP (MISCELLANEOUS) ×4 IMPLANT
CANISTER WOUND CARE 500ML ATS (WOUND CARE) ×1 IMPLANT
CLOTH BEACON ORANGE TIMEOUT ST (SAFETY) ×2 IMPLANT
COVER SURGICAL LIGHT HANDLE (MISCELLANEOUS) ×4 IMPLANT
CUFF TOURNIQUET SINGLE 24IN (TOURNIQUET CUFF) IMPLANT
CUFF TOURNIQUET SINGLE 34IN LL (TOURNIQUET CUFF) IMPLANT
DRAPE C-ARMOR (DRAPES) ×1 IMPLANT
DRAPE INCISE IOBAN 66X45 STRL (DRAPES) ×2 IMPLANT
DRAPE ORTHO SPLIT 77X108 STRL (DRAPES) ×4
DRAPE SURG ORHT 6 SPLT 77X108 (DRAPES) ×2 IMPLANT
DRAPE U-SHAPE 47X51 STRL (DRAPES) ×2 IMPLANT
DRSG ADAPTIC 3X8 NADH LF (GAUZE/BANDAGES/DRESSINGS) ×2 IMPLANT
DRSG MEPITEL 4X7.2 (GAUZE/BANDAGES/DRESSINGS) ×6 IMPLANT
DRSG PAD ABDOMINAL 8X10 ST (GAUZE/BANDAGES/DRESSINGS) ×2 IMPLANT
DRSG VAC ATS LRG SENSATRAC (GAUZE/BANDAGES/DRESSINGS) ×2 IMPLANT
DRSG VAC ATS MED SENSATRAC (GAUZE/BANDAGES/DRESSINGS) IMPLANT
DRSG VAC ATS SM SENSATRAC (GAUZE/BANDAGES/DRESSINGS) IMPLANT
ELECT REM PT RETURN 9FT ADLT (ELECTROSURGICAL) ×2
ELECTRODE REM PT RTRN 9FT ADLT (ELECTROSURGICAL) ×1 IMPLANT
GLOVE BIO SURGEON STRL SZ7.5 (GLOVE) ×2 IMPLANT
GLOVE BIO SURGEON STRL SZ8 (GLOVE) ×2 IMPLANT
GLOVE BIOGEL PI IND STRL 7.5 (GLOVE) ×1 IMPLANT
GLOVE BIOGEL PI IND STRL 8 (GLOVE) ×1 IMPLANT
GLOVE BIOGEL PI INDICATOR 7.5 (GLOVE) ×1
GLOVE BIOGEL PI INDICATOR 8 (GLOVE) ×1
GOWN PREVENTION PLUS XLARGE (GOWN DISPOSABLE) ×2 IMPLANT
GOWN STRL NON-REIN LRG LVL3 (GOWN DISPOSABLE) ×4 IMPLANT
KIT BASIN OR (CUSTOM PROCEDURE TRAY) ×2 IMPLANT
KIT ROOM TURNOVER OR (KITS) ×2 IMPLANT
MANIFOLD NEPTUNE II (INSTRUMENTS) ×2 IMPLANT
NS IRRIG 1000ML POUR BTL (IV SOLUTION) ×2 IMPLANT
PACK GENERAL/GYN (CUSTOM PROCEDURE TRAY) ×2 IMPLANT
PAD ARMBOARD 7.5X6 YLW CONV (MISCELLANEOUS) ×4 IMPLANT
SET MONITOR QUICK PRESSURE (MISCELLANEOUS) IMPLANT
SPONGE GAUZE 4X4 12PLY (GAUZE/BANDAGES/DRESSINGS) ×2 IMPLANT
SPONGE LAP 18X18 X RAY DECT (DISPOSABLE) ×2 IMPLANT
STAPLER VISISTAT 35W (STAPLE) IMPLANT
STOCKINETTE IMPERVIOUS 9X36 MD (GAUZE/BANDAGES/DRESSINGS) ×2 IMPLANT
SUT ETHILON 1 LR 30 (SUTURE) ×2 IMPLANT
SUT ETHILON 2 0 FSLX (SUTURE) IMPLANT
SUT ETHILON 2 0 PSLX (SUTURE) ×2 IMPLANT
SUT ETHILON 2 LR (SUTURE) ×6 IMPLANT
SUT ETHILON O TP 1 (SUTURE) ×2 IMPLANT
SUT VIC AB 0 CTB1 27 (SUTURE) IMPLANT
SUT VIC AB 2-0 CT3 27 (SUTURE) IMPLANT
SUT VIC AB 2-0 CTB1 (SUTURE) IMPLANT
TOWEL OR 17X24 6PK STRL BLUE (TOWEL DISPOSABLE) ×2 IMPLANT
TOWEL OR 17X26 10 PK STRL BLUE (TOWEL DISPOSABLE) ×4 IMPLANT
WATER STERILE IRR 1000ML POUR (IV SOLUTION) ×2 IMPLANT

## 2012-01-10 NOTE — Preoperative (Signed)
Beta Blockers   Reason not to administer Beta Blockers:Not Applicable 

## 2012-01-10 NOTE — Anesthesia Procedure Notes (Signed)
Procedure Name: Intubation Date/Time: 01/10/2012 5:41 PM Performed by: Ellin Goodie Pre-anesthesia Checklist: Patient identified, Emergency Drugs available, Suction available, Patient being monitored and Timeout performed Patient Re-evaluated:Patient Re-evaluated prior to inductionOxygen Delivery Method: Circle system utilized Preoxygenation: Pre-oxygenation with 100% oxygen Intubation Type: IV induction Laryngoscope Size: Mac and 3 Grade View: Grade I Tube type: Oral Tube size: 8.0 mm Number of attempts: 1 Airway Equipment and Method: Stylet Placement Confirmation: ETT inserted through vocal cords under direct vision,  positive ETCO2 and breath sounds checked- equal and bilateral Secured at: 23 cm Tube secured with: Tape Dental Injury: Teeth and Oropharynx as per pre-operative assessment

## 2012-01-10 NOTE — Consult Note (Signed)
Agree and have signed dictation with comments.   Budd Palmer, MD 01/10/2012 6:49 PM

## 2012-01-10 NOTE — Consult Note (Signed)
I have seen and examined the patient. I agree with the findings above. I discussed with the patient the risks and benefits of surgery, including the possibility of infection, nerve injury, vessel injury, wound breakdown, arthritis, symptomatic hardware, DVT/ PE, loss of motion, and need for further surgery among others.  We also specifically discussed the need to stage surgery because of the elevated risk of soft tissue breakdown that could lead to amputation.  He understood these risks and wished to proceed with today's plan of ex-fix adjustment and possible fasciotomy closure.  Budd Palmer, MD 01/10/2012 6:48 PM
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
## 2012-01-10 NOTE — Anesthesia Preprocedure Evaluation (Addendum)
Anesthesia Evaluation  Patient identified by MRN, date of birth, ID band Patient awake    Reviewed: Allergy & Precautions, H&P , NPO status , Patient's Chart, lab work & pertinent test results  History of Anesthesia Complications Negative for: history of anesthetic complications  Airway Mallampati: II TM Distance: <3 FB Neck ROM: Full    Dental No notable dental hx. (+) Edentulous Upper, Poor Dentition and Dental Advisory Given   Pulmonary COPD COPD inhaler, Current Smoker,  breath sounds clear to auscultation  Pulmonary exam normal       Cardiovascular hypertension, Pt. on medications Rhythm:Regular Rate:Normal  '08 ECHO EF 55%, valves OK INR 1.2   Neuro/Psych ETOH daily Neuromuscular disease    GI/Hepatic negative GI ROS,   Endo/Other  negative endocrine ROS  Renal/GU negative Renal ROS     Musculoskeletal   Abdominal (+)  Abdomen: soft.    Peds  Hematology negative hematology ROS (+)   Anesthesia Other Findings   Reproductive/Obstetrics                        Anesthesia Physical Anesthesia Plan  ASA: III  Anesthesia Plan: General   Post-op Pain Management:    Induction: Intravenous  Airway Management Planned: Oral ETT  Additional Equipment:   Intra-op Plan:   Post-operative Plan: Extubation in OR  Informed Consent: I have reviewed the patients History and Physical, chart, labs and discussed the procedure including the risks, benefits and alternatives for the proposed anesthesia with the patient or authorized representative who has indicated his/her understanding and acceptance.   Dental advisory given  Plan Discussed with: CRNA and Surgeon  Anesthesia Plan Comments: (Plan routine monitors, GETA.   Sandford Craze, MD)       Anesthesia Quick Evaluation

## 2012-01-10 NOTE — Progress Notes (Signed)
PT Cancellation Note  Treatment cancelled today due to medical issues with patient which prohibited therapy and patient receiving procedure or test.  Pt's RN asked therapy be held due to pt going for surgery this afternoon.  Kipton Skillen 01/10/2012, 2:28 PM

## 2012-01-10 NOTE — Brief Op Note (Signed)
01/06/2012 - 01/10/2012  6:50 PM  PATIENT:  Lucas [name]  61 y.o. male  PRE-OPERATIVE DIAGNOSIS:  left leg fasciotomies, tibial plateau and displaced shaft fractures  POST-OPERATIVE DIAGNOSIS:   left leg fasciotomies, tibial plateau and displaced shaft fractures   PROCEDURE:  Procedure(s) (LRB): 1. Adjustment external fixator with removal and reapplication of all clamps and bars 2. Partial closure 3. Application of wound vac  SURGEON:  Surgeon(s) and Role:    * Budd Palmer, MD - Primary  PHYSICIAN ASSISTANT: Montez Morita, [facility]  ANESTHESIA:   general  EBL:  Total I/O In: 1500 [I.V.:1500] Out: 430 [Urine:400; Blood:30]  BLOOD ADMINISTERED:none  DRAINS: none   LOCAL MEDICATIONS USED:  NONE  SPECIMEN:  No Specimen  DISPOSITION OF SPECIMEN:  N/A  COUNTS:  YES  TOURNIQUET:  * Missing tourniquet times found for documented tourniquets in log:  [id number] *  DICTATION: .Other Dictation: Dictation Number [id number]  PLAN OF CARE: Admit to inpatient   PATIENT DISPOSITION:  PACU - hemodynamically stable.   Delay start of Pharmacological VTE agent (>24hrs) due to surgical blood loss or risk of bleeding: no

## 2012-01-10 NOTE — Transfer of Care (Signed)
Immediate Anesthesia Transfer of Care Note  Patient: Lucas [name]  Procedure(s) Performed: Procedure(s) (LRB): FASCIOTOMY (Left)  Patient Location: PACU  Anesthesia Type: General  Level of Consciousness: awake  Airway & Oxygen Therapy: Patient Spontanous Breathing  Post-op Assessment: Report given to PACU RN  Post vital signs: stable  Complications: No apparent anesthesia complications

## 2012-01-10 NOTE — Anesthesia Postprocedure Evaluation (Signed)
  Anesthesia Post-op Note  Patient: Lucas [name]  Procedure(s) Performed: Procedure(s) (LRB): FASCIOTOMY (Left)  Patient Location: PACU  Anesthesia Type: General  Level of Consciousness: awake and alert   Airway and Oxygen Therapy: Patient Spontanous Breathing and Patient connected to nasal cannula oxygen  Post-op Pain: mild  Post-op Assessment: Post-op Vital signs reviewed, Patient's Cardiovascular Status Stable, Respiratory Function Stable, Patent Airway and No signs of Nausea or vomiting  Post-op Vital Signs: Reviewed and stable  Complications: No apparent anesthesia complications
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
## 2012-01-11 ENCOUNTER — Inpatient Hospital Stay (HOSPITAL_COMMUNITY): Payer: Medicare Other

## 2012-01-11 ENCOUNTER — Encounter (HOSPITAL_COMMUNITY): Payer: Self-pay | Admitting: Orthopedic Surgery

## 2012-01-11 MED ORDER — VANCOMYCIN HCL IN DEXTROSE 1-5 GM/200ML-% IV SOLN
1000.0000 mg | Freq: Once | INTRAVENOUS | Status: AC
  Administered 2012-01-12: 1000 mg via INTRAVENOUS
  Filled 2012-01-11 (×3): qty 200

## 2012-01-11 MED ORDER — ENOXAPARIN SODIUM 40 MG/0.4ML ~~LOC~~ SOLN
40.0000 mg | SUBCUTANEOUS | Status: DC
  Administered 2012-01-13: 40 mg via SUBCUTANEOUS
  Filled 2012-01-11 (×3): qty 0.4

## 2012-01-11 MED ORDER — LACTATED RINGERS IV SOLN
INTRAVENOUS | Status: DC
  Administered 2012-01-12 (×3): via INTRAVENOUS

## 2012-01-11 MED ORDER — MIDAZOLAM HCL 2 MG/2ML IJ SOLN: 1.0000 mg | INTRAMUSCULAR | Status: DC | PRN

## 2012-01-11 MED ORDER — FENTANYL CITRATE 0.05 MG/ML IJ SOLN: 50.0000 ug | INTRAMUSCULAR | Status: DC | PRN
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
## 2012-01-12 ENCOUNTER — Encounter (HOSPITAL_COMMUNITY): Payer: Self-pay | Admitting: Anesthesiology

## 2012-01-12 ENCOUNTER — Encounter (HOSPITAL_COMMUNITY)
Admission: EM | Disposition: A | Discharge status: Home Health | Payer: Self-pay | Source: Home / Self Care | Attending: Orthopedic Surgery

## 2012-01-12 ENCOUNTER — Inpatient Hospital Stay (HOSPITAL_COMMUNITY): Payer: Medicare Other | Admitting: Anesthesiology

## 2012-01-12 HISTORY — PX: I & D EXTREMITY: SHX5045

## 2012-01-12 LAB — SURGICAL PCR SCREEN: MRSA, PCR: NEGATIVE

## 2012-01-12 LAB — CBC
MCH: 31.2 pg (ref 26.0–34.0)
Platelets: 213 10*3/uL (ref 150–400)
RBC: 2.76 MIL/uL — ABNORMAL LOW (ref 4.22–5.81)
RDW: 12.2 % (ref 11.5–15.5)

## 2012-01-12 LAB — COMPREHENSIVE METABOLIC PANEL
AST: 23 U/L (ref 0–37)
Alkaline Phosphatase: 63 U/L (ref 39–117)
BUN: 12 mg/dL (ref 6–23)
CO2: 30 mEq/L (ref 19–32)
Chloride: 93 mEq/L — ABNORMAL LOW (ref 96–112)
Creatinine, Ser: 1 mg/dL (ref 0.50–1.35)
GFR calc non Af Amer: 79 mL/min — ABNORMAL LOW (ref >90)
Potassium: 3 mEq/L — ABNORMAL LOW (ref 3.5–5.1)
Total Bilirubin: 0.7 mg/dL (ref 0.3–1.2)

## 2012-01-12 SURGERY — IRRIGATION AND DEBRIDEMENT EXTREMITY
Anesthesia: General | Site: Leg Lower | Laterality: Left | Wound class: Clean

## 2012-01-12 MED ORDER — MORPHINE SULFATE 4 MG/ML IJ SOLN: 0.0500 mg/kg | INTRAMUSCULAR | Status: DC | PRN

## 2012-01-12 MED ORDER — EPHEDRINE SULFATE 50 MG/ML IJ SOLN
INTRAMUSCULAR | Status: DC | PRN
  Administered 2012-01-12: 10 mg via INTRAVENOUS

## 2012-01-12 MED ORDER — ONDANSETRON HCL 4 MG/2ML IJ SOLN: 4.0000 mg | Freq: Once | INTRAMUSCULAR | Status: AC | PRN

## 2012-01-12 MED ORDER — MIDAZOLAM HCL 5 MG/5ML IJ SOLN
INTRAMUSCULAR | Status: DC | PRN
  Administered 2012-01-12: 2 mg via INTRAVENOUS

## 2012-01-12 MED ORDER — OXYCODONE-ACETAMINOPHEN 5-325 MG PO TABS: 1.0000 | ORAL_TABLET | Freq: Four times a day (QID) | ORAL | Status: DC | PRN

## 2012-01-12 MED ORDER — HYDROMORPHONE HCL PF 1 MG/ML IJ SOLN: 0.2500 mg | INTRAMUSCULAR | Status: DC | PRN

## 2012-01-12 MED ORDER — SODIUM CHLORIDE 0.9 % IR SOLN
Status: DC | PRN
  Administered 2012-01-12: 1000 mL

## 2012-01-12 MED ORDER — VANCOMYCIN HCL IN DEXTROSE 1-5 GM/200ML-% IV SOLN
1000.0000 mg | Freq: Two times a day (BID) | INTRAVENOUS | Status: AC
Start: 2012-01-12 — End: 2012-01-13
  Administered 2012-01-12 – 2012-01-13 (×2): 1000 mg via INTRAVENOUS
  Filled 2012-01-12 (×2): qty 200

## 2012-01-12 MED ORDER — LIDOCAINE HCL 4 % MT SOLN
OROMUCOSAL | Status: DC | PRN
  Administered 2012-01-12: 4 mL via TOPICAL

## 2012-01-12 MED ORDER — FENTANYL CITRATE 0.05 MG/ML IJ SOLN
INTRAMUSCULAR | Status: DC | PRN
  Administered 2012-01-12: 100 ug via INTRAVENOUS
  Administered 2012-01-12: 50 ug via INTRAVENOUS

## 2012-01-12 MED ORDER — DEXTROSE 5 % IV SOLN
INTRAVENOUS | Status: DC | PRN
  Administered 2012-01-12: 10:00:00 via INTRAVENOUS

## 2012-01-12 MED ORDER — PROPOFOL 10 MG/ML IV EMUL
INTRAVENOUS | Status: DC | PRN
  Administered 2012-01-12: 150 mg via INTRAVENOUS

## 2012-01-12 SURGICAL SUPPLY — 53 items
BANDAGE ELASTIC 4 VELCRO ST LF (GAUZE/BANDAGES/DRESSINGS) ×1 IMPLANT
BANDAGE ELASTIC 6 VELCRO ST LF (GAUZE/BANDAGES/DRESSINGS) ×1 IMPLANT
BANDAGE GAUZE ELAST BULKY 4 IN (GAUZE/BANDAGES/DRESSINGS) ×2 IMPLANT
BLADE SURG 10 STRL SS (BLADE) ×2 IMPLANT
BNDG COHESIVE 4X5 TAN STRL (GAUZE/BANDAGES/DRESSINGS) ×1 IMPLANT
BNDG GAUZE STRTCH 6 (GAUZE/BANDAGES/DRESSINGS) ×3 IMPLANT
BRUSH SCRUB DISP (MISCELLANEOUS) ×4 IMPLANT
CLOTH BEACON ORANGE TIMEOUT ST (SAFETY) ×2 IMPLANT
COVER SURGICAL LIGHT HANDLE (MISCELLANEOUS) ×4 IMPLANT
DRAPE C-ARMOR (DRAPES) IMPLANT
DRAPE U-SHAPE 47X51 STRL (DRAPES) ×2 IMPLANT
DRSG ADAPTIC 3X8 NADH LF (GAUZE/BANDAGES/DRESSINGS) ×2 IMPLANT
DRSG EMULSION OIL 3X3 NADH (GAUZE/BANDAGES/DRESSINGS) ×1 IMPLANT
DRSG MEPILEX BORDER 4X8 (GAUZE/BANDAGES/DRESSINGS) ×2 IMPLANT
ELECT CAUTERY BLADE 6.4 (BLADE) IMPLANT
ELECT REM PT RETURN 9FT ADLT (ELECTROSURGICAL)
ELECTRODE REM PT RTRN 9FT ADLT (ELECTROSURGICAL) IMPLANT
GLOVE BIO SURGEON STRL SZ7.5 (GLOVE) ×2 IMPLANT
GLOVE BIO SURGEON STRL SZ8 (GLOVE) ×2 IMPLANT
GLOVE BIOGEL PI IND STRL 7.5 (GLOVE) ×1 IMPLANT
GLOVE BIOGEL PI IND STRL 8 (GLOVE) ×1 IMPLANT
GLOVE BIOGEL PI INDICATOR 7.5 (GLOVE) ×1
GLOVE BIOGEL PI INDICATOR 8 (GLOVE) ×1
GLOVE EXAM NITRILE LRG STRL (GLOVE) ×1 IMPLANT
GLOVE SURG SS PI 7.5 STRL IVOR (GLOVE) ×2 IMPLANT
GLOVE SURG SS PI 8.0 STRL IVOR (GLOVE) ×2 IMPLANT
GOWN ISOL BLUE XXL (GOWNS) ×1 IMPLANT
GOWN PREVENTION PLUS XLARGE (GOWN DISPOSABLE) ×2 IMPLANT
GOWN STRL NON-REIN LRG LVL3 (GOWN DISPOSABLE) ×5 IMPLANT
HANDPIECE INTERPULSE COAX TIP (DISPOSABLE)
KIT BASIN OR (CUSTOM PROCEDURE TRAY) ×2 IMPLANT
KIT ROOM TURNOVER OR (KITS) ×2 IMPLANT
MANIFOLD NEPTUNE II (INSTRUMENTS) ×2 IMPLANT
NS IRRIG 1000ML POUR BTL (IV SOLUTION) ×2 IMPLANT
PACK ORTHO EXTREMITY (CUSTOM PROCEDURE TRAY) ×2 IMPLANT
PAD ARMBOARD 7.5X6 YLW CONV (MISCELLANEOUS) ×4 IMPLANT
PADDING CAST COTTON 6X4 STRL (CAST SUPPLIES) ×1 IMPLANT
SET HNDPC FAN SPRY TIP SCT (DISPOSABLE) IMPLANT
SPONGE GAUZE 4X4 12PLY (GAUZE/BANDAGES/DRESSINGS) ×2 IMPLANT
SPONGE LAP 18X18 X RAY DECT (DISPOSABLE) ×2 IMPLANT
STAPLER VISISTAT 35W (STAPLE) ×2 IMPLANT
STOCKINETTE IMPERVIOUS 9X36 MD (GAUZE/BANDAGES/DRESSINGS) ×1 IMPLANT
SUT ETHILON 2 0 FS 18 (SUTURE) ×10 IMPLANT
SUT PDS AB 2-0 CT1 27 (SUTURE) IMPLANT
SUT VIC AB 2-0 CT1 27 (SUTURE) ×6
SUT VIC AB 2-0 CT1 TAPERPNT 27 (SUTURE) ×3 IMPLANT
TOWEL OR 17X24 6PK STRL BLUE (TOWEL DISPOSABLE) ×2 IMPLANT
TOWEL OR 17X26 10 PK STRL BLUE (TOWEL DISPOSABLE) ×4 IMPLANT
TUBE ANAEROBIC SPECIMEN COL (MISCELLANEOUS) IMPLANT
TUBE CONNECTING 12X1/4 (SUCTIONS) ×2 IMPLANT
UNDERPAD 30X30 INCONTINENT (UNDERPADS AND DIAPERS) ×3 IMPLANT
WATER STERILE IRR 1000ML POUR (IV SOLUTION) IMPLANT
YANKAUER SUCT BULB TIP NO VENT (SUCTIONS) ×2 IMPLANT

## 2012-01-12 NOTE — Transfer of Care (Signed)
Immediate Anesthesia Transfer of Care Note  Patient: Lucas [name]  Procedure(s) Performed: Procedure(s) (LRB): IRRIGATION AND DEBRIDEMENT EXTREMITY (Left)  Patient Location: PACU  Anesthesia Type: General  Level of Consciousness: awake, alert  and oriented  Airway & Oxygen Therapy: Patient Spontanous Breathing and Patient connected to nasal cannula oxygen  Post-op Assessment: Report given to PACU RN, Post -op Vital signs reviewed and stable and Patient moving all extremities  Post vital signs: Reviewed and stable  Complications: No apparent anesthesia complications

## 2012-01-12 NOTE — Brief Op Note (Signed)
01/06/2012 - 01/12/2012  11:32 AM  PATIENT:  Kirstie Mirza  61 y.o. male  PRE-OPERATIVE DIAGNOSIS:  open fasciotomy status post LEFT TIBIAL PLATEAU FRACTURE   POST-OPERATIVE DIAGNOSIS:  open fasciotomy status post LEFT TIBIAL PLATEAU FRACTURE   PROCEDURE:  Procedure(s) (LRB): IRRIGATION AND DEBRIDEMENT EXTREMITY (Left) with 26cm layered closure  SURGEON:  Surgeon(s) and Role:    * Budd Palmer, MD - Primary  PHYSICIAN ASSISTANT: Montez Morita, [facility]  ANESTHESIA:   none  EBL:  Total I/O In: 200 [I.V.:200] Out: -   BLOOD ADMINISTERED:none  DRAINS: none   LOCAL MEDICATIONS USED:  NONE  SPECIMEN:  No Specimen  DISPOSITION OF SPECIMEN:  N/A  COUNTS:  YES  TOURNIQUET:  * No tourniquets in log *  DICTATION: .Other Dictation: Dictation Number [id number]  PLAN OF CARE: Admit to inpatient   PATIENT DISPOSITION:  PACU - hemodynamically stable.   Delay start of Pharmacological VTE agent (>24hrs) due to surgical blood loss or risk of bleeding: no

## 2012-01-12 NOTE — Anesthesia Preprocedure Evaluation (Addendum)
Anesthesia Evaluation  Patient identified by MRN, date of birth, ID band Patient awake    Reviewed: Allergy & Precautions, H&P , NPO status , Patient's Chart, lab work & pertinent test results  Airway Mallampati: I TM Distance: >3 FB Neck ROM: Full    Dental   Pulmonary COPDCurrent Smoker,          Cardiovascular hypertension, Pt. on medications     Neuro/Psych    GI/Hepatic H/O ETOH abuse   Endo/Other    Renal/GU      Musculoskeletal   Abdominal   Peds  Hematology   Anesthesia Other Findings   Reproductive/Obstetrics                          Anesthesia Physical Anesthesia Plan  ASA: II  Anesthesia Plan: General   Post-op Pain Management:    Induction:   Airway Management Planned: Oral ETT  Additional Equipment:   Intra-op Plan:   Post-operative Plan: Extubation in OR  Informed Consent: I have reviewed the patients History and Physical, chart, labs and discussed the procedure including the risks, benefits and alternatives for the proposed anesthesia with the patient or authorized representative who has indicated his/her understanding and acceptance.     Plan Discussed with: CRNA and Surgeon  Anesthesia Plan Comments:         Anesthesia Quick Evaluation

## 2012-01-12 NOTE — Anesthesia Procedure Notes (Signed)
Procedure Name: Intubation Date/Time: 01/12/2012 10:27 AM Performed by: Charm Barges, Hewitt R Pre-anesthesia Checklist: Patient identified, Emergency Drugs available, Suction available, Patient being monitored and Timeout performed Patient Re-evaluated:Patient Re-evaluated prior to inductionOxygen Delivery Method: Circle system utilized Preoxygenation: Pre-oxygenation with 100% oxygen Intubation Type: IV induction Ventilation: Mask ventilation with difficulty Laryngoscope Size: Mac and 4 Grade View: Grade I Tube type: Oral Number of attempts: 1 Airway Equipment and Method: Stylet and LTA kit utilized Placement Confirmation: ETT inserted through vocal cords under direct vision,  positive ETCO2 and breath sounds checked- equal and bilateral Secured at: 22 cm Tube secured with: Tape Dental Injury: Teeth and Oropharynx as per pre-operative assessment

## 2012-01-12 NOTE — Progress Notes (Signed)
I have seen and examined the patient, and did so with Mr. Lucas [name] at the time of his note. I agree with the findings above.  Budd Palmer, MD 01/12/2012 10:10 AM

## 2012-01-12 NOTE — Anesthesia Postprocedure Evaluation (Signed)
Anesthesia Post Note  Patient: Lucas [name]  Procedure(s) Performed: Procedure(s) (LRB): IRRIGATION AND DEBRIDEMENT EXTREMITY (Left)  Anesthesia type: general  Patient location: PACU  Post pain: Pain level controlled  Post assessment: Patient's Cardiovascular Status Stable  Last Vitals:  Filed Vitals:   01/12/12 1150  BP:   Pulse: 100  Temp:   Resp: 14    Post vital signs: Reviewed and stable  Level of consciousness: sedated  Complications: No apparent anesthesia complications

## 2012-01-12 NOTE — Progress Notes (Signed)
01/12/12 09:05 PT Note:  Pt currently going to OR for left LE closure.  Will follow-up with pt as able.  01/12/2012 Cephus Shelling, PT, DPT [phone]

## 2012-01-12 NOTE — Progress Notes (Signed)
   CARE MANAGEMENT NOTE 01/12/2012  Patient:  Lucas [name], Lucas [name]   Account Number:  [id number]  Date Initiated:  01/12/2012  Documentation initiated by:  GRAVES-BIGELOW,Matheson Vandehei  Subjective/Objective Assessment:   Pt admitted with Left leg pain.  S/p left leg fasciotomies. S/P irrigation and debridement left extremity 01-12-12. PT is following pt.     Action/Plan:   Anticipated DC Date:  01/16/2012   Anticipated DC Plan:  HOME W HOME HEALTH SERVICES      DC Planning Services  CM consult      Choice offered to / List presented to:             Status of service:  In process, will continue to follow Medicare Important Message given?   (If response is "NO", the following Medicare IM given date fields will be blank) Date Medicare IM given:   Date Additional Medicare IM given:    Discharge Disposition:    Per UR Regulation:    If discussed at Long Length of Stay Meetings, dates discussed:    Comments:  01-12-12 1613 Tomi Bamberger, RN,BSN [phone] CM will continue to f/u for disposition needs.

## 2012-01-12 NOTE — Op Note (Signed)
NAME:  [name], [name] NO.:  [id number]  MEDICAL RECORD NO.:  [id number]  LOCATION:  5025                         FACILITY:  [facility]  PHYSICIAN:  Doralee Albino. Carola Frost, M.D. DATE OF BIRTH:  1951/05/02  DATE OF PROCEDURE:  01/12/2012 DATE OF DISCHARGE:  01/10/2012                              OPERATIVE REPORT   PREOPERATIVE DIAGNOSIS:  Open left leg lateral fasciotomy.  POSTOPERATIVE DIAGNOSIS:  Open left leg lateral fasciotomy.  PROCEDURE:  Layered closure of left leg fasciotomy 26 cm.  SURGEON:  Doralee Albino. Carola Frost, M.D.  ASSISTANT:  Mearl Latin, [place]  ANESTHESIA:  General.  COMPLICATIONS:  None.  TOURNIQUET:  None.  DISPOSITION:  PACU.  CONDITION:  Stable.  BRIEF SUMMARY OF INDICATION FOR PROCEDURE:  Lucas [name] is a 61 year old male, status post bicondylar tibial plateau and shaft fractures treated with spanning, external fixation, and a 4-compartment fasciotomy, who underwent a partial closure using retention nylon sutures to better approximate the wound they were not able to actually get any of it together at that time.  Patient has had a wound VAC and Ace wraps since that time.  Returned to the OR today for skin grafting or layered closure.  We did discuss with him the risks and benefits of surgery including the possibility of infection, nerve injury, vessel injury, need to return to the OR for further procedures, scarring and others, and the patient did wish to proceed.  BRIEF DESCRIPTION OF PROCEDURE:  Lucas [name] did receive preop antibiotics, taken to operating room and general anesthesia was induced.  His left lower extremity was prepped and draped in the usual sterile fashion. The areas between the retention sutures were closed with 2-0 Vicryl and the subcu layer using the retention sutures which had loosened somewhat to help facilitate reapproximation of the wound edges.  Some interspersed 2-0 nylon sutures were used in addition to tightening  and re-tying the retention sutures.  The patient did tolerate the procedure without complication.  Montez Morita, P.A.C. assisted throughout and worked simultaneously with me for wound closure to expedite his care.  He was then taken to PACU in stable condition after application of a sterile gently compressive dressing.  PROGNOSIS:  Lucas [name] will continue in the fixator with compressive wraps both in the hospital and at discharge which may be within the next 24 hours.  We anticipate return to the office in 10-14 days for re- evaluation of his skin and possible definitive reconstruction within the next 2-4 weeks.  He is at increased risk for infection and skin breakdown given his fasciotomies and spanning fixator which was necessary for soft tissue condition, which had precluded early intervention.  He will be on DVT prophylaxis with Lovenox at the time of discharge if financially feasible, otherwise most likely aspirin.     Doralee Albino. Carola Frost, M.D.     [facility]/MEDQ  D:  01/12/2012  T:  01/12/2012  Job:  [id number]

## 2012-01-12 NOTE — Progress Notes (Signed)
Utilization review completed. Bright Spielmann, RN, BSN. 01/12/12  

## 2012-01-13 ENCOUNTER — Encounter (HOSPITAL_COMMUNITY): Payer: Self-pay | Admitting: Orthopedic Surgery

## 2012-01-13 DIAGNOSIS — S82202A Unspecified fracture of shaft of left tibia, initial encounter for closed fracture: Secondary | ICD-10-CM

## 2012-01-13 DIAGNOSIS — E876 Hypokalemia: Secondary | ICD-10-CM

## 2012-01-13 LAB — BASIC METABOLIC PANEL
BUN: 8 mg/dL (ref 6–23)
Chloride: 94 mEq/L — ABNORMAL LOW (ref 96–112)
Glucose, Bld: 123 mg/dL — ABNORMAL HIGH (ref 70–99)
Potassium: 3.3 mEq/L — ABNORMAL LOW (ref 3.5–5.1)
Sodium: 135 mEq/L (ref 135–145)

## 2012-01-13 MED ORDER — ENOXAPARIN (LOVENOX) PATIENT EDUCATION KIT
PACK | Freq: Once | Status: AC
  Administered 2012-01-13: 15:00:00
  Filled 2012-01-13: qty 1

## 2012-01-13 MED ORDER — ADULT MULTIVITAMIN W/MINERALS CH: 1.0000 | ORAL_TABLET | Freq: Every day | ORAL | Status: DC

## 2012-01-13 MED ORDER — POTASSIUM CHLORIDE CRYS ER 20 MEQ PO TBCR: 20.0000 meq | EXTENDED_RELEASE_TABLET | Freq: Three times a day (TID) | ORAL | Status: AC

## 2012-01-13 MED ORDER — BISACODYL 10 MG RE SUPP: 10.0000 mg | Freq: Every day | RECTAL | Status: DC | PRN

## 2012-01-13 MED ORDER — ENOXAPARIN SODIUM 40 MG/0.4ML ~~LOC~~ SOLN: 40.0000 mg | SUBCUTANEOUS | Status: DC

## 2012-01-13 MED ORDER — OXYCODONE-ACETAMINOPHEN 5-325 MG PO TABS: 1.0000 | ORAL_TABLET | Freq: Four times a day (QID) | ORAL | Status: AC | PRN

## 2012-01-13 MED ORDER — OXYCODONE HCL 5 MG PO TABS: 5.0000 mg | ORAL_TABLET | ORAL | Status: AC | PRN

## 2012-01-13 MED ORDER — POTASSIUM CHLORIDE CRYS ER 20 MEQ PO TBCR
20.0000 meq | EXTENDED_RELEASE_TABLET | Freq: Three times a day (TID) | ORAL | Status: DC
  Administered 2012-01-13: 20 meq via ORAL
  Filled 2012-01-13: qty 1

## 2012-01-13 MED ORDER — MAGNESIUM CITRATE PO SOLN
1.0000 | Freq: Once | ORAL | Status: AC
  Administered 2012-01-13: 1 via ORAL
  Filled 2012-01-13: qty 296

## 2012-01-13 NOTE — Progress Notes (Signed)
Order written for OT to fabricate a foot plate due to pt. In external fixator.  A foot plate was fabricated, and foot placed in neutral dorsi-flexion.  Pt. Was instructed to wear foot plate 2 hours on, and 2 hours off.  Pt also instructed how to don/doff foot plate.  He verbalized understanding, but unable to access foot to apply.  Pt. Provided with extra strapping and velcro.  Pt. Left prior to written info being given and check done on the foot plate to ensure he didn't have further questions.
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
## 2012-01-13 NOTE — Progress Notes (Signed)
Physical Therapy Treatment Patient Details Name: Lucas [name] MRN: [id number] DOB: 28-Feb-1951 Today's Date: 01/13/2012  PT Assessment/Plan  PT - Assessment/Plan Comments on Treatment Session: Pt completed step and crutch training today. Pt's MD reports he is cleared to D/C home today. I requested an order for HHPT and tall crutches. I reinforced with patient and also spoke with pt's wife about restrictions. Pt's wife verbally agreed to provide S with all mobility and understands she will need to be next to the patient while he is ambulatory. Previous notes recommended d/c to SNF.  Pt demonstrated good understanding and wife is able to provide assistance. Recommend D/C home with HHPT. PT Plan: Other (comment) (Pt d/c today to home with HHPT) Follow Up Recommendations: Home health PT Equipment Recommended: Other (comment) (tall crutches) PT Goals  Acute Rehab PT Goals PT Goal: Supine/Side to Sit - Progress: Met PT Goal: Sit to Supine/Side - Progress: Met PT Goal: Sit to Stand - Progress: Met PT Goal: Stand to Sit - Progress: Met PT Goal: Ambulate - Progress: Progressing toward goal PT Goal: Up/Down Stairs - Progress: Met PT Goal: Perform Home Exercise Program - Progress: Met  PT Treatment Precautions/Restrictions  Precautions Precautions: Fall Required Braces or Orthoses: No Restrictions Weight Bearing Restrictions: Yes LLE Weight Bearing: Non weight bearing Other Position/Activity Restrictions: pt with wound vac and external fixator in place Mobility (including Balance) Bed Mobility Bed Mobility: Yes Supine to Sit: 6: Modified independent (Device/Increase time) Sit to Supine: 6: Modified independent (Device/Increase time) Transfers Transfers: Yes Sit to Stand: 5: Supervision;From bed;From elevated surface;From chair/3-in-1 Sit to Stand Details (indicate cue type and reason): supervision for balance support due to NWB Stand to Sit: 5: Supervision;To bed;To chair/3-in-1 Stand to  Sit Details: supervision for balance support Ambulation/Gait Ambulation/Gait: Yes Ambulation/Gait Assistance: 4: Min assist Ambulation/Gait Assistance Details (indicate cue type and reason): min assist for balance. Pt requires standing rest breaks and some steading support for balance at times due to L NWB Ambulation Distance (Feet): 120 Feet Assistive device: Crutches Gait Pattern: Step-to pattern    Exercise  Total Joint Exercises Ankle Circles/Pumps: AROM;Strengthening;Both;20 reps;Supine Quad Sets: AROM;Strengthening;Both;20 reps Hip ABduction/ADduction: AAROM;Left;Strengthening;5 reps;Supine Straight Leg Raises: AAROM;Strengthening;Left;5 reps;Supine End of Session PT - End of Session Equipment Utilized During Treatment: Gait belt Activity Tolerance: Patient tolerated treatment well Patient left: in bed;with call bell in reach Nurse Communication: Mobility status for ambulation General Behavior During Session: [facility] for tasks performed Cognition: [facility] for tasks performed  Greggory Stallion 01/13/2012, 9:07 AM

## 2012-01-13 NOTE — Progress Notes (Signed)
Orthopedic Tech Progress Note Patient Details:  Lucas [name] 10-Dec-1950 [id number]  Other Ortho Devices Type of Ortho Device: Crutches Ortho Device Interventions: Application   Izaia Say T 01/13/2012, 11:38 AM

## 2012-01-13 NOTE — Discharge Instructions (Addendum)
Home Health RN and PT to be provided by [facility] [phone]   Orthopaedic Trauma Service Discharge Instructions, Pin Site and Wound Care   General Discharge Instructions  WEIGHT BEARING STATUS: Nonweightbearing left lower extremity RANGE OF MOTION/ACTIVITY: Range of motion as tolerated left ankle and hip. Activity as tolerated within weightbearing restrictions  DIET: as you were eating previously.  Can use over the counter stool softeners and bowel preparations, such as Miralax, to help with bowel movements.  Narcotics can be constipating.  Be sure to drink plenty of fluids  STOP SMOKING OR USING NICOTINE PRODUCTS!!!!  As discussed nicotine severely impairs your body's ability to heal surgical and traumatic wounds but also impairs bone healing.  Wounds and bone heal by forming microscopic blood vessels (angiogenesis) and nicotine is a vasoconstrictor (essentially, shrinks blood vessels).  Therefore, if vasoconstriction occurs to these microscopic blood vessels they essentially disappear and are unable to deliver necessary nutrients to the healing tissue.  This is one modifiable factor that you can do to dramatically increase your chances of healing your injury.    (This means no smoking, no nicotine gum, patches, etc)  DO NOT USE NONSTEROIDAL ANTI-INFLAMMATORY DRUGS (NSAID'S)  Using products such as Advil (ibuprofen), Aleve (naproxen), Motrin (ibuprofen) for additional pain control during fracture healing can delay and/or prevent the healing response.  If you would like to take over the counter (OTC) medication, Tylenol (acetaminophen) is ok.  However, some narcotic medications that are given for pain control contain acetaminophen as well. Therefore, you should not exceed more than 4000 mg of tylenol in a day if you do not have liver disease.  Also note that there are may OTC medicines, such as cold medicines and allergy medicines that my contain tylenol as well.  If you have any  questions about medications and/or interactions please ask your doctor/PA or your pharmacist.   PAIN MEDICATION USE AND EXPECTATIONS  You have likely been given narcotic medications to help control your pain.  After a traumatic event that results in an fracture (broken bone) with or without surgery, it is ok to use narcotic pain medications to help control one's pain.  We understand that everyone responds to pain differently and each individual patient will be evaluated on a regular basis for the continued need for narcotic medications. Ideally, narcotic medication use should last no more than 6-8 weeks (coinciding with fracture healing).   As a patient it is your responsibility as well to monitor narcotic medication use and report the amount and frequency you use these medications when you come to your office visit.   We would also advise that if you are using narcotic medications, you should take a dose prior to therapy to maximize you participation.  IF YOU ARE ON NARCOTIC MEDICATIONS IT IS NOT PERMISSIBLE TO OPERATE A MOTOR VEHICLE (MOTORCYCLE/CAR/TRUCK/MOPED) OR HEAVY MACHINERY DO NOT MIX NARCOTICS WITH OTHER CNS (CENTRAL NERVOUS SYSTEM) DEPRESSANTS SUCH AS ALCOHOL       ICE AND ELEVATE INJURED/OPERATIVE EXTREMITY  Using ice and elevating the injured extremity above your heart can help with swelling and pain control.  Icing in a pulsatile fashion, such as 20 minutes on and 20 minutes off, can be followed.    Do not place ice directly on skin. Make sure there is a barrier between to skin and the ice pack.    Using frozen items such as frozen peas works well as the conform nicely to the are that needs to be iced.  USE AN ACE WRAP OR TED HOSE FOR SWELLING CONTROL  In addition to icing and elevation, Ace wraps or TED hose are used to help limit and resolve swelling.  It is recommended to use Ace wraps or TED hose until you are informed to stop.    When using Ace Wraps start the wrapping distally  (farthest away from the body) and wrap proximally (closer to the body)   Example: If you had surgery on your leg or thing and you do not have a splint on, start the ace wrap at the toes and work your way up to the thigh        If you had surgery on your upper extremity and do not have a splint on, start the ace wrap at your fingers and work your way up to the upper arm  IF YOU ARE IN A SPLINT OR CAST DO NOT REMOVE IT FOR ANY REASON   If your splint gets wet for any reason please contact the office immediately. You may shower in your splint or cast as long as you keep it dry.  This can be done by wrapping in a cast cover or garbage back (or similar)  Do Not stick any thing down your splint or cast such as pencils, money, or hangers to try and scratch yourself with.  If you feel itchy take benadryl as prescribed on the bottle for itching  CALL THE OFFICE WITH ANY QUESTIONS OR CONCERTS: [phone]     Discharge Pin Site Instructions  Dress pins daily with Kerlix roll starting on POD 2. Wrap the Kerlix so that it tamps the skin down around the pin-skin interface to prevent/limit motion of the skin relative to the pin.  (Pin-skin motion is the primary cause of pain and infection related to external fixator pin sites).  Remove any crust or coagulum that may obstruct drainage with a saline moistened gauze or soap and water.  After POD 3, if there is no discernable drainage on the pin site dressing, the interval for change can by increased to every other day.  You may shower with the fixator, cleaning all pin sites gently with soap and water.  If you have a surgical wound this needs to be completely dry and without drainage before showering.  The extremity can be lifted by the fixator to facilitate wound care and transfers.  Notify the office/Doctor if you experience increasing drainage, redness, or pain from a pin site, or if you notice purulent (thick, snot-like) drainage.  Discharge Wound Care  Instructions  Do NOT apply any ointments, solutions or lotions to pin sites or surgical wounds.  These prevent needed drainage and even though solutions like hydrogen peroxide kill bacteria, they also damage cells lining the pin sites that help fight infection.  Applying lotions or ointments can keep the wounds moist and can cause them to breakdown and open up as well. This can increase the risk for infection. When in doubt call the office.  Surgical incisions should be dressed daily.  If any drainage is noted, use one layer of adaptic, then gauze, Kerlix, and an ace wrap.  Once the incision is completely dry and without drainage, it may be left open to air out.  Showering may begin 36-48 hours later.  Cleaning gently with soap and water.  Traumatic wounds should be dressed daily as well.    One layer of adaptic, gauze, Kerlix, then ace wrap.  The adaptic can be discontinued once the draining has  ceased    If you have a wet to dry dressing: wet the gauze with saline the squeeze as much saline out so the gauze is moist (not soaking wet), place moistened gauze over wound, then place a dry gauze over the moist one, followed by Kerlix wrap, then ace wrap.

## 2012-01-13 NOTE — Progress Notes (Signed)
CARE MANAGEMENT NOTE 01/13/2012      Action/Plan:         DC Planning Services  CM consult      [facility] Choice  HOME HEALTH   Choice offered to / List presented to:  C-1 Patient        HH arranged  HH-2 PT  HH-1 RN      [facility] agency  [facility]   Status of service:  Completed, signed off  Discharge Disposition:  HOME W HOME HEALTH SERVICES      Comments:  01/13/12 1142 Vance Peper, RN BSN Case Manager Spoke with patient and offered choice for home health needs. Patient's co-pay for generic lovenox is $95.00 with no pre authorization needed. Brand form will require preauthorization and be 30% of cost.  01-12-12 1613 Tomi Bamberger, RN,BSN [phone] CM will continue to f/u for disposition needs.
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
## 2012-01-13 NOTE — Patient Instructions (Signed)
Wear and care of foot plate

## 2012-01-15 ENCOUNTER — Encounter (HOSPITAL_COMMUNITY): Payer: Self-pay

## 2012-01-15 ENCOUNTER — Emergency Department (HOSPITAL_COMMUNITY)
Admission: EM | Admit: 2012-01-15 | Discharge: 2012-01-15 | Disposition: A | Discharge status: Home / Self Care | Payer: Medicare Other | Attending: Emergency Medicine | Admitting: Emergency Medicine

## 2012-01-15 DIAGNOSIS — F17200 Nicotine dependence, unspecified, uncomplicated: Secondary | ICD-10-CM | POA: Insufficient documentation

## 2012-01-15 DIAGNOSIS — I10 Essential (primary) hypertension: Secondary | ICD-10-CM | POA: Insufficient documentation

## 2012-01-15 DIAGNOSIS — J449 Chronic obstructive pulmonary disease, unspecified: Secondary | ICD-10-CM | POA: Insufficient documentation

## 2012-01-15 DIAGNOSIS — Z79899 Other long term (current) drug therapy: Secondary | ICD-10-CM | POA: Insufficient documentation

## 2012-01-15 DIAGNOSIS — J4489 Other specified chronic obstructive pulmonary disease: Secondary | ICD-10-CM | POA: Insufficient documentation

## 2012-01-15 DIAGNOSIS — G8918 Other acute postprocedural pain: Secondary | ICD-10-CM

## 2012-01-15 DIAGNOSIS — E785 Hyperlipidemia, unspecified: Secondary | ICD-10-CM | POA: Insufficient documentation

## 2012-01-15 MED ORDER — HYDROMORPHONE HCL PF 1 MG/ML IJ SOLN
1.0000 mg | Freq: Once | INTRAMUSCULAR | Status: AC
  Administered 2012-01-15: 1 mg via INTRAVENOUS
  Filled 2012-01-15: qty 1

## 2012-01-15 MED ORDER — SODIUM CHLORIDE 0.9 % IV SOLN
Freq: Once | INTRAVENOUS | Status: AC
  Administered 2012-01-15: 08:00:00 via INTRAVENOUS

## 2012-01-15 NOTE — ED Notes (Signed)
Pt and wife are eating
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
## 2012-01-15 NOTE — Progress Notes (Signed)
   CARE MANAGEMENT NOTE 01/15/2012  Patient:  Lucas [name], Lucas [name]   Account Number:  [id number]  Date Initiated:  01/15/2012  Documentation initiated by:  [facility]  Subjective/Objective Assessment:   Complex left bicondylar tibial plateau fracture with shaft extension     Action/Plan:   lives at home with wife   Anticipated DC Date:  01/15/2012   Anticipated DC Plan:  HOME/SELF CARE      DC Planning Services  CM consult  Medication Assistance      Choice offered to / List presented to:             Status of service:  Completed, signed off Medicare Important Message given?   (If response is "NO", the following Medicare IM given date fields will be blank) Date Medicare IM given:   Date Additional Medicare IM given:    Discharge Disposition:  HOME/SELF CARE  Per UR Regulation:    If discussed at Long Length of Stay Meetings, dates discussed:    Comments:  01/15/2012 1120 Pt unable to afford copays for his medications. States he will not receive his check until 1st of the month. Encouraged pt to discuss with family and friends for assistance with medications. Explained no program available to help with copays. Isidoro Donning RN CCM Case Mgmt phone [phone]

## 2012-01-15 NOTE — ED Provider Notes (Signed)
Medical screening examination/treatment/procedure(s) were performed by non-physician practitioner and as supervising physician I was immediately available for consultation/collaboration.  Doug Sou, MD 01/15/12 1756

## 2012-01-15 NOTE — ED Notes (Signed)
French Ana from Social work is assisting pt w/filling rx, she has to submit to house management and it could take up to 2 hrs to determine pt's eligibility
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
## 2012-01-15 NOTE — Discharge Instructions (Signed)
You will need to look for a way to purchase the pain medications.  I am unable to get assistance for this since you have insurance.  Follow up to her orthopedist and her primary care doctor.  Return here as needed.

## 2012-01-15 NOTE — ED Notes (Signed)
French Ana Social worker is coming to stretcher ED to talk with pt and attempt to have pt's Rx filled

## 2012-01-18 ENCOUNTER — Encounter (HOSPITAL_COMMUNITY): Payer: Self-pay | Admitting: Orthopedic Surgery

## 2012-01-27 ENCOUNTER — Ambulatory Visit: Payer: Medicare Other | Admitting: Pharmacist

## 2012-01-30 ENCOUNTER — Encounter (HOSPITAL_COMMUNITY): Payer: Self-pay | Admitting: Pharmacy Technician

## 2012-02-03 ENCOUNTER — Encounter (HOSPITAL_COMMUNITY): Payer: Self-pay

## 2012-02-03 NOTE — Progress Notes (Signed)
Contacted Gwen with Dr. Magdalene Patricia office, requested preop orders.

## 2012-02-07 ENCOUNTER — Encounter (HOSPITAL_COMMUNITY): Payer: Self-pay | Admitting: Anesthesiology

## 2012-02-07 ENCOUNTER — Ambulatory Visit (HOSPITAL_COMMUNITY): Payer: Medicare Other

## 2012-02-07 ENCOUNTER — Ambulatory Visit (HOSPITAL_COMMUNITY): Payer: Medicare Other | Admitting: Anesthesiology

## 2012-02-07 ENCOUNTER — Encounter (HOSPITAL_COMMUNITY)
Admission: RE | Disposition: A | Discharge status: Home Health | Payer: Self-pay | Source: Ambulatory Visit | Attending: Orthopedic Surgery

## 2012-02-07 ENCOUNTER — Inpatient Hospital Stay (HOSPITAL_COMMUNITY)
Admission: RE | Admit: 2012-02-07 | Discharge: 2012-02-09 | DRG: 489 | Disposition: A | Discharge status: Home Health | Payer: Medicare Other | Source: Ambulatory Visit | Attending: Orthopedic Surgery | Admitting: Orthopedic Surgery

## 2012-02-07 ENCOUNTER — Encounter (HOSPITAL_COMMUNITY): Payer: Self-pay | Admitting: *Deleted

## 2012-02-07 ENCOUNTER — Encounter (HOSPITAL_COMMUNITY): Payer: Self-pay | Admitting: Orthopedic Surgery

## 2012-02-07 DIAGNOSIS — E785 Hyperlipidemia, unspecified: Secondary | ICD-10-CM | POA: Insufficient documentation

## 2012-02-07 DIAGNOSIS — Z79899 Other long term (current) drug therapy: Secondary | ICD-10-CM

## 2012-02-07 DIAGNOSIS — S82143A Displaced bicondylar fracture of unspecified tibia, initial encounter for closed fracture: Secondary | ICD-10-CM | POA: Insufficient documentation

## 2012-02-07 DIAGNOSIS — F1020 Alcohol dependence, uncomplicated: Secondary | ICD-10-CM | POA: Insufficient documentation

## 2012-02-07 DIAGNOSIS — S82209A Unspecified fracture of shaft of unspecified tibia, initial encounter for closed fracture: Secondary | ICD-10-CM | POA: Diagnosis present

## 2012-02-07 DIAGNOSIS — J301 Allergic rhinitis due to pollen: Secondary | ICD-10-CM

## 2012-02-07 DIAGNOSIS — Z720 Tobacco use: Secondary | ICD-10-CM | POA: Insufficient documentation

## 2012-02-07 DIAGNOSIS — F1010 Alcohol abuse, uncomplicated: Secondary | ICD-10-CM | POA: Diagnosis present

## 2012-02-07 DIAGNOSIS — I10 Essential (primary) hypertension: Secondary | ICD-10-CM | POA: Diagnosis present

## 2012-02-07 DIAGNOSIS — Y92410 Unspecified street and highway as the place of occurrence of the external cause: Secondary | ICD-10-CM

## 2012-02-07 DIAGNOSIS — J449 Chronic obstructive pulmonary disease, unspecified: Secondary | ICD-10-CM | POA: Insufficient documentation

## 2012-02-07 DIAGNOSIS — Z4789 Encounter for other orthopedic aftercare: Secondary | ICD-10-CM

## 2012-02-07 DIAGNOSIS — J4489 Other specified chronic obstructive pulmonary disease: Secondary | ICD-10-CM | POA: Diagnosis present

## 2012-02-07 DIAGNOSIS — S82109A Unspecified fracture of upper end of unspecified tibia, initial encounter for closed fracture: Principal | ICD-10-CM | POA: Diagnosis present

## 2012-02-07 DIAGNOSIS — S82202A Unspecified fracture of shaft of left tibia, initial encounter for closed fracture: Secondary | ICD-10-CM

## 2012-02-07 DIAGNOSIS — F17200 Nicotine dependence, unspecified, uncomplicated: Secondary | ICD-10-CM | POA: Diagnosis present

## 2012-02-07 HISTORY — PX: ORIF TIBIA PLATEAU: SHX2132

## 2012-02-07 LAB — BASIC METABOLIC PANEL
BUN: 6 mg/dL (ref 6–23)
CO2: 24 mEq/L (ref 19–32)
Chloride: 101 mEq/L (ref 96–112)
Creatinine, Ser: 0.77 mg/dL (ref 0.50–1.35)
Glucose, Bld: 99 mg/dL (ref 70–99)

## 2012-02-07 LAB — CBC
HCT: 35.7 % — ABNORMAL LOW (ref 39.0–52.0)
Hemoglobin: 11.9 g/dL — ABNORMAL LOW (ref 13.0–17.0)
MCV: 93.2 fL (ref 78.0–100.0)
RBC: 3.83 MIL/uL — ABNORMAL LOW (ref 4.22–5.81)
WBC: 4.4 10*3/uL (ref 4.0–10.5)

## 2012-02-07 LAB — GRAM STAIN

## 2012-02-07 SURGERY — OPEN REDUCTION INTERNAL FIXATION (ORIF) TIBIAL PLATEAU
Anesthesia: General | Laterality: Left

## 2012-02-07 MED ORDER — 0.9 % SODIUM CHLORIDE (POUR BTL) OPTIME
TOPICAL | Status: DC | PRN
  Administered 2012-02-07: 1000 mL

## 2012-02-07 MED ORDER — FENTANYL CITRATE 0.05 MG/ML IJ SOLN
INTRAMUSCULAR | Status: DC | PRN
  Administered 2012-02-07: 100 ug via INTRAVENOUS
  Administered 2012-02-07 (×4): 50 ug via INTRAVENOUS
  Administered 2012-02-07: 100 ug via INTRAVENOUS
  Administered 2012-02-07 (×3): 50 ug via INTRAVENOUS
  Administered 2012-02-07: 100 ug via INTRAVENOUS
  Administered 2012-02-07 (×3): 50 ug via INTRAVENOUS

## 2012-02-07 MED ORDER — LEVOCETIRIZINE DIHYDROCHLORIDE 5 MG PO TABS: 5.0000 mg | ORAL_TABLET | Freq: Every evening | ORAL | Status: DC

## 2012-02-07 MED ORDER — HYDROMORPHONE HCL PF 1 MG/ML IJ SOLN
0.2500 mg | INTRAMUSCULAR | Status: DC | PRN
  Administered 2012-02-07 (×2): 0.5 mg via INTRAVENOUS
  Administered 2012-02-07: 0.25 mg via INTRAVENOUS

## 2012-02-07 MED ORDER — MORPHINE SULFATE (PF) 1 MG/ML IV SOLN
INTRAVENOUS | Status: AC
  Administered 2012-02-07: 22:00:00 via INTRAVENOUS
  Administered 2012-02-08: 5 mg via INTRAVENOUS
  Administered 2012-02-08: 4 mg via INTRAVENOUS
  Administered 2012-02-08: 2 mg via INTRAVENOUS

## 2012-02-07 MED ORDER — POTASSIUM CHLORIDE IN NACL 20-0.9 MEQ/L-% IV SOLN
INTRAVENOUS | Status: DC
  Filled 2012-02-07 (×2): qty 1000

## 2012-02-07 MED ORDER — METHOCARBAMOL 500 MG PO TABS
500.0000 mg | ORAL_TABLET | Freq: Four times a day (QID) | ORAL | Status: DC | PRN
  Administered 2012-02-08 – 2012-02-09 (×3): 500 mg via ORAL
  Filled 2012-02-07 (×3): qty 1

## 2012-02-07 MED ORDER — TIOTROPIUM BROMIDE MONOHYDRATE 18 MCG IN CAPS
18.0000 ug | ORAL_CAPSULE | Freq: Every day | RESPIRATORY_TRACT | Status: DC
  Administered 2012-02-08 – 2012-02-09 (×2): 18 ug via RESPIRATORY_TRACT
  Filled 2012-02-07: qty 5

## 2012-02-07 MED ORDER — HYDROMORPHONE HCL PF 1 MG/ML IJ SOLN
INTRAMUSCULAR | Status: AC
  Filled 2012-02-07: qty 1

## 2012-02-07 MED ORDER — METOCLOPRAMIDE HCL 10 MG PO TABS: 5.0000 mg | ORAL_TABLET | Freq: Three times a day (TID) | ORAL | Status: DC | PRN

## 2012-02-07 MED ORDER — MORPHINE SULFATE 2 MG/ML IJ SOLN
INTRAMUSCULAR | Status: DC | PRN
  Administered 2012-02-07: 10 mg via INTRAVENOUS

## 2012-02-07 MED ORDER — VANCOMYCIN HCL IN DEXTROSE 1-5 GM/200ML-% IV SOLN
1000.0000 mg | Freq: Two times a day (BID) | INTRAVENOUS | Status: AC
  Administered 2012-02-08: 1000 mg via INTRAVENOUS
  Filled 2012-02-07: qty 200

## 2012-02-07 MED ORDER — DIPHENHYDRAMINE HCL 12.5 MG/5ML PO ELIX: 12.5000 mg | ORAL_SOLUTION | Freq: Four times a day (QID) | ORAL | Status: DC | PRN

## 2012-02-07 MED ORDER — LACTATED RINGERS IV SOLN
INTRAVENOUS | Status: DC
  Administered 2012-02-07: 13:00:00 via INTRAVENOUS

## 2012-02-07 MED ORDER — CEFAZOLIN SODIUM 1-5 GM-% IV SOLN
INTRAVENOUS | Status: DC | PRN
  Administered 2012-02-07 (×2): 1 g via INTRAVENOUS

## 2012-02-07 MED ORDER — DOCUSATE SODIUM 100 MG PO CAPS
100.0000 mg | ORAL_CAPSULE | Freq: Two times a day (BID) | ORAL | Status: DC
  Administered 2012-02-08 – 2012-02-09 (×3): 100 mg via ORAL
  Filled 2012-02-07 (×4): qty 1

## 2012-02-07 MED ORDER — POTASSIUM CHLORIDE CRYS ER 20 MEQ PO TBCR
20.0000 meq | EXTENDED_RELEASE_TABLET | Freq: Three times a day (TID) | ORAL | Status: DC
  Administered 2012-02-08 – 2012-02-09 (×5): 20 meq via ORAL
  Filled 2012-02-07 (×8): qty 1

## 2012-02-07 MED ORDER — HYDROCHLOROTHIAZIDE 25 MG PO TABS
25.0000 mg | ORAL_TABLET | Freq: Every day | ORAL | Status: DC
  Administered 2012-02-08 – 2012-02-09 (×2): 25 mg via ORAL
  Filled 2012-02-07 (×2): qty 1

## 2012-02-07 MED ORDER — ALBUTEROL SULFATE HFA 108 (90 BASE) MCG/ACT IN AERS
2.0000 | INHALATION_SPRAY | RESPIRATORY_TRACT | Status: DC | PRN
  Filled 2012-02-07 (×2): qty 6.7

## 2012-02-07 MED ORDER — ONDANSETRON HCL 4 MG/2ML IJ SOLN: 4.0000 mg | Freq: Four times a day (QID) | INTRAMUSCULAR | Status: DC | PRN

## 2012-02-07 MED ORDER — ONDANSETRON HCL 4 MG PO TABS: 4.0000 mg | ORAL_TABLET | Freq: Four times a day (QID) | ORAL | Status: DC | PRN

## 2012-02-07 MED ORDER — EPHEDRINE SULFATE 50 MG/ML IJ SOLN
INTRAMUSCULAR | Status: DC | PRN
  Administered 2012-02-07 (×2): 10 mg via INTRAVENOUS

## 2012-02-07 MED ORDER — METHOCARBAMOL 100 MG/ML IJ SOLN
500.0000 mg | Freq: Four times a day (QID) | INTRAVENOUS | Status: DC | PRN
  Filled 2012-02-07: qty 5

## 2012-02-07 MED ORDER — METOCLOPRAMIDE HCL 5 MG/ML IJ SOLN: 5.0000 mg | Freq: Three times a day (TID) | INTRAMUSCULAR | Status: DC | PRN

## 2012-02-07 MED ORDER — ONDANSETRON HCL 4 MG/2ML IJ SOLN
INTRAMUSCULAR | Status: DC | PRN
  Administered 2012-02-07: 4 mg via INTRAVENOUS

## 2012-02-07 MED ORDER — MORPHINE SULFATE (PF) 1 MG/ML IV SOLN
INTRAVENOUS | Status: AC
  Filled 2012-02-07: qty 25

## 2012-02-07 MED ORDER — FLUTICASONE PROPIONATE 50 MCG/ACT NA SUSP
2.0000 | Freq: Every day | NASAL | Status: DC
  Administered 2012-02-08: 2 via NASAL
  Filled 2012-02-07: qty 16

## 2012-02-07 MED ORDER — MORPHINE SULFATE 10 MG/ML IJ SOLN: 0.0500 mg/kg | INTRAMUSCULAR | Status: DC | PRN

## 2012-02-07 MED ORDER — LACTATED RINGERS IV SOLN
INTRAVENOUS | Status: DC | PRN
  Administered 2012-02-07 (×3): via INTRAVENOUS

## 2012-02-07 MED ORDER — POLYETHYLENE GLYCOL 3350 17 G PO PACK
17.0000 g | PACK | Freq: Every day | ORAL | Status: DC
  Administered 2012-02-08 – 2012-02-09 (×2): 17 g via ORAL
  Filled 2012-02-07 (×2): qty 1

## 2012-02-07 MED ORDER — OXYCODONE-ACETAMINOPHEN 5-325 MG PO TABS
1.0000 | ORAL_TABLET | ORAL | Status: DC | PRN
  Administered 2012-02-08 – 2012-02-09 (×4): 2 via ORAL
  Filled 2012-02-07 (×4): qty 2

## 2012-02-07 MED ORDER — LACTATED RINGERS IV SOLN: INTRAVENOUS | Status: DC

## 2012-02-07 MED ORDER — NALOXONE HCL 0.4 MG/ML IJ SOLN: 0.4000 mg | INTRAMUSCULAR | Status: DC | PRN

## 2012-02-07 MED ORDER — FERROUS SULFATE 325 (65 FE) MG PO TABS
325.0000 mg | ORAL_TABLET | Freq: Three times a day (TID) | ORAL | Status: DC
  Administered 2012-02-08 – 2012-02-09 (×4): 325 mg via ORAL
  Filled 2012-02-07 (×7): qty 1

## 2012-02-07 MED ORDER — PROPOFOL 10 MG/ML IV EMUL
INTRAVENOUS | Status: DC | PRN
  Administered 2012-02-07: 20 mg via INTRAVENOUS
  Administered 2012-02-07: 180 mg via INTRAVENOUS

## 2012-02-07 MED ORDER — VECURONIUM BROMIDE 10 MG IV SOLR
INTRAVENOUS | Status: DC | PRN
Start: 2012-02-07 — End: 2012-02-07
  Administered 2012-02-07: 7 mg via INTRAVENOUS
  Administered 2012-02-07: 2 mg via INTRAVENOUS
  Administered 2012-02-07 (×2): 3 mg via INTRAVENOUS
  Administered 2012-02-07: 4 mg via INTRAVENOUS
  Administered 2012-02-07: 3 mg via INTRAVENOUS
  Administered 2012-02-07: 2 mg via INTRAVENOUS

## 2012-02-07 MED ORDER — ENOXAPARIN SODIUM 40 MG/0.4ML ~~LOC~~ SOLN
40.0000 mg | SUBCUTANEOUS | Status: DC
  Administered 2012-02-08 – 2012-02-09 (×2): 40 mg via SUBCUTANEOUS
  Filled 2012-02-07 (×3): qty 0.4

## 2012-02-07 MED ORDER — OXYCODONE HCL 5 MG PO TABS
5.0000 mg | ORAL_TABLET | ORAL | Status: DC | PRN
  Administered 2012-02-08 – 2012-02-09 (×6): 10 mg via ORAL
  Filled 2012-02-07 (×6): qty 2

## 2012-02-07 MED ORDER — HYDROXYZINE HCL 25 MG PO TABS: 50.0000 mg | ORAL_TABLET | Freq: Four times a day (QID) | ORAL | Status: DC | PRN

## 2012-02-07 MED ORDER — ADULT MULTIVITAMIN W/MINERALS CH
1.0000 | ORAL_TABLET | Freq: Every day | ORAL | Status: DC
  Administered 2012-02-08 – 2012-02-09 (×2): 1 via ORAL
  Filled 2012-02-07 (×2): qty 1

## 2012-02-07 MED ORDER — CEFAZOLIN SODIUM 1-5 GM-% IV SOLN
INTRAVENOUS | Status: AC
  Filled 2012-02-07: qty 50

## 2012-02-07 MED ORDER — SPIRITUS FRUMENTI
1.0000 | ORAL | Status: DC | PRN
  Filled 2012-02-07: qty 1

## 2012-02-07 MED ORDER — SODIUM CHLORIDE 0.9 % IJ SOLN: 9.0000 mL | INTRAMUSCULAR | Status: DC | PRN

## 2012-02-07 MED ORDER — MAGNESIUM CITRATE PO SOLN
1.0000 | Freq: Once | ORAL | Status: AC | PRN
  Filled 2012-02-07: qty 296

## 2012-02-07 MED ORDER — BISACODYL 10 MG RE SUPP: 10.0000 mg | Freq: Every day | RECTAL | Status: DC | PRN

## 2012-02-07 MED ORDER — LORATADINE 10 MG PO TABS
10.0000 mg | ORAL_TABLET | Freq: Every day | ORAL | Status: DC
  Administered 2012-02-08 – 2012-02-09 (×2): 10 mg via ORAL
  Filled 2012-02-07 (×2): qty 1

## 2012-02-07 MED ORDER — ATORVASTATIN CALCIUM 80 MG PO TABS
80.0000 mg | ORAL_TABLET | Freq: Every day | ORAL | Status: DC
  Administered 2012-02-08 – 2012-02-09 (×2): 80 mg via ORAL
  Filled 2012-02-07 (×2): qty 1

## 2012-02-07 MED ORDER — DIPHENHYDRAMINE HCL 50 MG/ML IJ SOLN: 12.5000 mg | Freq: Four times a day (QID) | INTRAMUSCULAR | Status: DC | PRN

## 2012-02-07 SURGICAL SUPPLY — 94 items
3.5 variable angle proximal tibial plate ×1 IMPLANT
90mm cortical screw ×2 IMPLANT
BANDAGE ELASTIC 4 VELCRO ST LF (GAUZE/BANDAGES/DRESSINGS) ×2 IMPLANT
BANDAGE ELASTIC 6 VELCRO ST LF (GAUZE/BANDAGES/DRESSINGS) ×2 IMPLANT
BANDAGE ESMARK 6X9 LF (GAUZE/BANDAGES/DRESSINGS) ×1 IMPLANT
BANDAGE GAUZE ELAST BULKY 4 IN (GAUZE/BANDAGES/DRESSINGS) ×2 IMPLANT
BIT DRILL 2.5X110 QC LCP DISP (BIT) ×2 IMPLANT
BIT DRILL CALI LONG 2.8MM (BIT) IMPLANT
BIT DRILL QC 3.5X110 (BIT) ×1 IMPLANT
BLADE SURG 10 STRL SS (BLADE) ×2 IMPLANT
BLADE SURG 15 STRL LF DISP TIS (BLADE) ×1 IMPLANT
BLADE SURG 15 STRL SS (BLADE) ×2
BLADE SURG ROTATE 9660 (MISCELLANEOUS) IMPLANT
BNDG CMPR 9X6 STRL LF SNTH (GAUZE/BANDAGES/DRESSINGS) ×1
BNDG ESMARK 6X9 LF (GAUZE/BANDAGES/DRESSINGS) ×2
BRUSH SCRUB DISP (MISCELLANEOUS) ×4 IMPLANT
CLEANER TIP ELECTROSURG 2X2 (MISCELLANEOUS) ×2 IMPLANT
CLOTH BEACON ORANGE TIMEOUT ST (SAFETY) ×2 IMPLANT
COVER MAYO STAND STRL (DRAPES) ×2 IMPLANT
DRAPE C-ARM 42X72 X-RAY (DRAPES) ×2 IMPLANT
DRAPE C-ARMOR (DRAPES) ×2 IMPLANT
DRAPE INCISE IOBAN 66X45 STRL (DRAPES) ×2 IMPLANT
DRAPE ORTHO SPLIT 77X108 STRL (DRAPES)
DRAPE STERILE CONDUCTIVE (DRAPES) ×1 IMPLANT
DRAPE SURG ORHT 6 SPLT 77X108 (DRAPES) IMPLANT
DRAPE U-SHAPE 47X51 STRL (DRAPES) ×2 IMPLANT
DRILL BIT CALI LONG 2.8MM (BIT) ×2
DRSG ADAPTIC 3X8 NADH LF (GAUZE/BANDAGES/DRESSINGS) ×2 IMPLANT
DRSG PAD ABDOMINAL 8X10 ST (GAUZE/BANDAGES/DRESSINGS) ×5 IMPLANT
ELECT REM PT RETURN 9FT ADLT (ELECTROSURGICAL) ×2
ELECTRODE REM PT RTRN 9FT ADLT (ELECTROSURGICAL) ×1 IMPLANT
EVACUATOR 1/8 PVC DRAIN (DRAIN) IMPLANT
EVACUATOR 3/16  PVC DRAIN (DRAIN)
EVACUATOR 3/16 PVC DRAIN (DRAIN) IMPLANT
EXPLEXUR MEDIUM (Putty) ×1 IMPLANT
GLOVE BIO SURGEON STRL SZ7.5 (GLOVE) ×2 IMPLANT
GLOVE BIO SURGEON STRL SZ8 (GLOVE) ×3 IMPLANT
GLOVE BIOGEL PI IND STRL 7.5 (GLOVE) ×1 IMPLANT
GLOVE BIOGEL PI IND STRL 8 (GLOVE) ×1 IMPLANT
GLOVE BIOGEL PI INDICATOR 7.5 (GLOVE) ×1
GLOVE BIOGEL PI INDICATOR 8 (GLOVE) ×1
GOWN PREVENTION PLUS XLARGE (GOWN DISPOSABLE) ×5 IMPLANT
GOWN STRL NON-REIN LRG LVL3 (GOWN DISPOSABLE) ×4 IMPLANT
IMMOBILIZER KNEE 22 UNIV (SOFTGOODS) ×2 IMPLANT
K-WIRE 1.6X150 (WIRE) ×6
KIT BASIN OR (CUSTOM PROCEDURE TRAY) ×2 IMPLANT
KIT ROOM TURNOVER OR (KITS) ×2 IMPLANT
KWIRE 1.6X150 (WIRE) IMPLANT
MANIFOLD NEPTUNE II (INSTRUMENTS) ×2 IMPLANT
NEEDLE 22X1 1/2 (OR ONLY) (NEEDLE) ×1 IMPLANT
NS IRRIG 1000ML POUR BTL (IV SOLUTION) ×2 IMPLANT
PACK ORTHO EXTREMITY (CUSTOM PROCEDURE TRAY) ×2 IMPLANT
PAD ARMBOARD 7.5X6 YLW CONV (MISCELLANEOUS) ×4 IMPLANT
PAD CAST 4YDX4 CTTN HI CHSV (CAST SUPPLIES) ×1 IMPLANT
PADDING CAST COTTON 4X4 STRL (CAST SUPPLIES) ×2
PADDING CAST COTTON 6X4 STRL (CAST SUPPLIES) ×2 IMPLANT
PROS LCP PLATE 6H 85MM (Plate) ×2 IMPLANT
PROSTHESIS LCP PLATE 6H 85MM (Plate) IMPLANT
SCREW CORTEX 3.5 28MM (Screw) ×1 IMPLANT
SCREW CORTEX 3.5 30MM (Screw) ×1 IMPLANT
SCREW CORTEX 3.5 34MM (Screw) ×2 IMPLANT
SCREW CORTEX 3.5 36MM (Screw) ×1 IMPLANT
SCREW LOCK 3.5X85 (Screw) ×1 IMPLANT
SCREW LOCK CORT ST 3.5X28 (Screw) IMPLANT
SCREW LOCK CORT ST 3.5X30 (Screw) ×1 IMPLANT
SCREW LOCK CORT ST 3.5X34 (Screw) IMPLANT
SCREW LOCK CORT ST 3.5X36 (Screw) IMPLANT
SCREW LOCKING 3.5X80MM VA (Screw) ×2 IMPLANT
SCREW LOCKING VA 3.5X28MM (Screw) ×2 IMPLANT
SCREW LOCKING VA 3.5X75MM (Screw) ×2 IMPLANT
SCREW LOCKING VA 3.5X85MM (Screw) ×6 IMPLANT
SPONGE GAUZE 4X4 12PLY (GAUZE/BANDAGES/DRESSINGS) ×2 IMPLANT
SPONGE LAP 18X18 X RAY DECT (DISPOSABLE) ×6 IMPLANT
STAPLER VISISTAT 35W (STAPLE) ×2 IMPLANT
STOCKINETTE IMPERVIOUS LG (DRAPES) ×3 IMPLANT
SUCTION FRAZIER TIP 10 FR DISP (SUCTIONS) ×2 IMPLANT
SUT ETHILON 2 LR (SUTURE) ×1 IMPLANT
SUT ETHILON 3 0 PS 1 (SUTURE) ×3 IMPLANT
SUT ETHILON O TP 1 (SUTURE) ×4 IMPLANT
SUT PROLENE 0 CT 2 (SUTURE) ×3 IMPLANT
SUT VIC AB 0 CT1 27 (SUTURE) ×2
SUT VIC AB 0 CT1 27XBRD ANBCTR (SUTURE) ×1 IMPLANT
SUT VIC AB 1 CT1 27 (SUTURE) ×2
SUT VIC AB 1 CT1 27XBRD ANBCTR (SUTURE) ×1 IMPLANT
SUT VIC AB 2-0 CT1 27 (SUTURE) ×8
SUT VIC AB 2-0 CT1 TAPERPNT 27 (SUTURE) ×4 IMPLANT
SYR 20ML ECCENTRIC (SYRINGE) IMPLANT
SYR CONTROL 10ML LL (SYRINGE) ×1 IMPLANT
TOWEL OR 17X24 6PK STRL BLUE (TOWEL DISPOSABLE) ×2 IMPLANT
TOWEL OR 17X26 10 PK STRL BLUE (TOWEL DISPOSABLE) ×4 IMPLANT
TRAY FOLEY CATH 14FR (SET/KITS/TRAYS/PACK) ×1 IMPLANT
TUBE CONNECTING 12X1/4 (SUCTIONS) ×2 IMPLANT
WATER STERILE IRR 1000ML POUR (IV SOLUTION) ×2 IMPLANT
YANKAUER SUCT BULB TIP NO VENT (SUCTIONS) ×3 IMPLANT

## 2012-02-07 NOTE — Interval H&P Note (Signed)
History and Physical Interval Note:  02/07/2012 2:02 PM  Lucas [name]  has presented today for surgery, with the diagnosis of left tibial platau and tibial shaft fractures with retained external fixator  The various methods of treatment have been discussed with the patient and family. After consideration of risks, benefits and other options for treatment, the patient has consented to  Procedure(s) (LRB): OPEN REDUCTION INTERNAL FIXATION (ORIF) TIBIAL PLATEAU and shaft (Left) as a surgical intervention .  The patients' history has been reviewed, patient examined, no change in status, stable for surgery.  I have reviewed the patients' chart and labs.  Questions were answered to the patient's satisfaction.     [name] H

## 2012-02-07 NOTE — Transfer of Care (Signed)
Immediate Anesthesia Transfer of Care Note  Patient: Lucas [name]  Procedure(s) Performed: Procedure(s) (LRB): OPEN REDUCTION INTERNAL FIXATION (ORIF) TIBIAL PLATEAU (Left)  Patient Location: PACU  Anesthesia Type: General  Level of Consciousness: awake  Airway & Oxygen Therapy: Patient Spontanous Breathing and Patient connected to nasal cannula oxygen  Post-op Assessment: Report given to PACU RN and Post -op Vital signs reviewed and stable  Post vital signs: Reviewed and stable  Complications: No apparent anesthesia complications

## 2012-02-07 NOTE — Interval H&P Note (Signed)
History and Physical Interval Note:  02/07/2012 12:01 PM  Lucas [name]  has presented today for surgery, with the diagnosis of left tibial platau fracture with retained external fixator  The various methods of treatment have been discussed with the patient and family. After consideration of risks, benefits and other options for treatment, the patient has consented to  Procedure(s) (LRB): OPEN REDUCTION INTERNAL FIXATION (ORIF) TIBIAL PLATEAU (Left) as a surgical intervention .  The patients' history has been reviewed, patient examined, no change in status, stable for surgery.  I have reviewed the patients' chart and labs.  Questions were answered to the patient's satisfaction.    There has been no significant changes in the pts history and physical exam since consultation that was performed on 01/10/2012  Gen: NAD Lungs: dec at bases but o/w clear Cardiac: reg Abd: + BS Left Lower Extremity  Retained ex fix  pinsites stable  Swelling decreased  Skin wrinkles with gentle compression  Distal motor and sensory functions intact  Ext warm  + DP pulse  Previous fasciotomy sites are well healed    A/P   Complex L bicondylar tibial plateau fracture s/p ex fix and fasciotomies for compartment syndrome  OR for ORIF L tibial plateau Removal of ex fix left leg Admit for pain control and therapies  Mearl Latin, PA-C [facility] [phone] (P) 12:04 PM 02/07/2012

## 2012-02-07 NOTE — Anesthesia Postprocedure Evaluation (Signed)
Anesthesia Post Note  Patient: Lucas [name]  Procedure(s) Performed: Procedure(s) (LRB): OPEN REDUCTION INTERNAL FIXATION (ORIF) TIBIAL PLATEAU (Left)  Anesthesia type: general  Patient location: PACU  Post pain: Pain level controlled  Post assessment: Patient's Cardiovascular Status Stable  Last Vitals:  Filed Vitals:   02/07/12 2030  BP: 156/85  Pulse: 100  Temp:   Resp: 14    Post vital signs: Reviewed and stable  Level of consciousness: sedated  Complications: No apparent anesthesia complications

## 2012-02-07 NOTE — Preoperative (Signed)
Beta Blockers   Reason not to administer Beta Blockers:Not Applicable 

## 2012-02-07 NOTE — Anesthesia Procedure Notes (Signed)
Procedure Name: Intubation Date/Time: 02/07/2012 2:26 PM Performed by: Bronson Ing Pre-anesthesia Checklist: Patient identified, Emergency Drugs available, Suction available, Patient being monitored and Timeout performed Patient Re-evaluated:Patient Re-evaluated prior to inductionOxygen Delivery Method: Circle system utilized Preoxygenation: Pre-oxygenation with 100% oxygen Intubation Type: IV induction Ventilation: Mask ventilation without difficulty Laryngoscope Size: Mac and 4 Grade View: Grade I Tube type: Oral Tube size: 7.5 mm Number of attempts: 1 Placement Confirmation: ETT inserted through vocal cords under direct vision,  breath sounds checked- equal and bilateral,  positive ETCO2 and CO2 detector Secured at: 23 cm Tube secured with: Tape Dental Injury: Teeth and Oropharynx as per pre-operative assessment
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
## 2012-02-07 NOTE — Anesthesia Preprocedure Evaluation (Addendum)
Anesthesia Evaluation  Patient identified by MRN, date of birth, ID band  Reviewed: Allergy & Precautions, H&P , NPO status , Patient's Chart, lab work & pertinent test results  Airway Mallampati: II      Dental  (+) Poor Dentition   Pulmonary shortness of breath and with exertion, COPD COPD inhaler, former smoker breath sounds clear to auscultation        Cardiovascular hypertension, Pt. on medications Rhythm:Regular Rate:Normal     Neuro/Psych    GI/Hepatic negative GI ROS, Neg liver ROS,   Endo/Other  negative endocrine ROS  Renal/GU      Musculoskeletal   Abdominal   Peds  Hematology negative hematology ROS (+)   Anesthesia Other Findings   Reproductive/Obstetrics                          Anesthesia Physical Anesthesia Plan  ASA: III  Anesthesia Plan: General   Post-op Pain Management:    Induction: Intravenous  Airway Management Planned: Oral ETT  Additional Equipment:   Intra-op Plan:   Post-operative Plan:   Informed Consent:   Dental advisory given and Dental Advisory Given  Plan Discussed with: CRNA, Surgeon and Anesthesiologist  Anesthesia Plan Comments:        Anesthesia Quick Evaluation
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
## 2012-02-08 LAB — CBC
HCT: 31.1 % — ABNORMAL LOW (ref 39.0–52.0)
MCHC: 32.8 g/dL (ref 30.0–36.0)
Platelets: 227 10*3/uL (ref 150–400)
RDW: 14.6 % (ref 11.5–15.5)
WBC: 8.2 10*3/uL (ref 4.0–10.5)

## 2012-02-08 LAB — BASIC METABOLIC PANEL
BUN: 6 mg/dL (ref 6–23)
Chloride: 100 mEq/L (ref 96–112)
GFR calc Af Amer: >90 mL/min (ref >90)
GFR calc non Af Amer: >90 mL/min (ref >90)
Potassium: 4.2 mEq/L (ref 3.5–5.1)
Sodium: 135 mEq/L (ref 135–145)

## 2012-02-08 MED ORDER — MORPHINE SULFATE (PF) 1 MG/ML IV SOLN
INTRAVENOUS | Status: AC
  Filled 2012-02-08: qty 25

## 2012-02-08 NOTE — Progress Notes (Signed)
Orthopedic Tech Progress Note Patient Details:  Lucas [name] [birth year] [id number]  Patient ID: Lucas [name], male   DOB: 02/14/1951, 61 y.o.   MRN: [id number] Contact Advanced PO with brace order.  Acie Custis T 02/08/2012, 1:48 PM
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
## 2012-02-08 NOTE — Progress Notes (Signed)
UR COMPLETED  

## 2012-02-08 NOTE — Progress Notes (Signed)
OT Cancellation Note  Treatment cancelled today due to patient's refusal to participate due to increased pain and recently working with P.T. Will re-attempt as time allows.  Prakash Kimberling, OTR/L Pager [phone] 02/08/2012, 10:36 AM
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
## 2012-02-08 NOTE — Progress Notes (Signed)
Orthopedic Tech Progress Note Patient Details:  Lucas [name] 08/21/1951 [id number]  Patient ID: Lucas [name], male   DOB: [birth year], 61 y.o.   MRN: [id number] Order completed by advanced  Nikki Dom 02/08/2012, 3:00 PM

## 2012-02-09 LAB — BASIC METABOLIC PANEL
BUN: 5 mg/dL — ABNORMAL LOW (ref 6–23)
Chloride: 97 mEq/L (ref 96–112)
GFR calc non Af Amer: >90 mL/min (ref >90)
Glucose, Bld: 106 mg/dL — ABNORMAL HIGH (ref 70–99)
Potassium: 3.7 mEq/L (ref 3.5–5.1)
Sodium: 135 mEq/L (ref 135–145)

## 2012-02-09 LAB — CBC
HCT: 26.4 % — ABNORMAL LOW (ref 39.0–52.0)
Hemoglobin: 8.8 g/dL — ABNORMAL LOW (ref 13.0–17.0)
MCHC: 33.3 g/dL (ref 30.0–36.0)
RBC: 2.88 MIL/uL — ABNORMAL LOW (ref 4.22–5.81)
WBC: 7.9 10*3/uL (ref 4.0–10.5)

## 2012-02-09 MED ORDER — ENOXAPARIN (LOVENOX) PATIENT EDUCATION KIT
PACK | Freq: Once | Status: AC
  Administered 2012-02-09: 14:00:00
  Filled 2012-02-09: qty 1

## 2012-02-09 MED ORDER — ENOXAPARIN SODIUM 40 MG/0.4ML ~~LOC~~ SOLN: 40.0000 mg | SUBCUTANEOUS | Status: DC

## 2012-02-09 MED ORDER — OXYCODONE HCL 5 MG PO TABS: 5.0000 mg | ORAL_TABLET | ORAL | Status: AC | PRN

## 2012-02-09 MED ORDER — ENOXAPARIN (LOVENOX) PATIENT EDUCATION KIT: 1.0000 | PACK | Freq: Once | Status: DC

## 2012-02-09 MED ORDER — OXYCODONE-ACETAMINOPHEN 5-325 MG PO TABS: 1.0000 | ORAL_TABLET | Freq: Four times a day (QID) | ORAL | Status: DC | PRN

## 2012-02-09 MED ORDER — DSS 100 MG PO CAPS: 100.0000 mg | ORAL_CAPSULE | Freq: Two times a day (BID) | ORAL | Status: AC
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
## 2012-02-09 NOTE — Evaluation (Signed)
Occupational Therapy Evaluation Patient Details Name: Lucas [name] MRN: [id number] DOB: 1951/05/22 Today's Date: 02/09/2012  Problem List:  Patient Active Problem List  Diagnoses  . HYPERLIPIDEMIA  . ALCOHOL USE  . TOBACCO DEPENDENCE  . PERIPHERAL NEUROPATHY  . HYPERTENSION, BENIGN SYSTEMIC  . ALLERGIC RHINITIS, SEASONAL  . COPD  . DEFORMITY, FOOT NEC, CONGENITAL  . FRACTURE, WRIST, LEFT  . Ganglion cyst  . Fracture, tibial plateau (Left bicondylar)  . Hypokalemia  . Fracture of tibial shaft, left, closed    Past Medical History:  Past Medical History  Diagnosis Date  . HYPERLIPIDEMIA 12/21/2006  . TOBACCO DEPENDENCE 12/21/2006  . HYPERTENSION, BENIGN SYSTEMIC 12/21/2006  . COPD 12/21/2006  . Environmental allergies    Past Surgical History:  Past Surgical History  Procedure Date  . Knee surgery     left knee and knee  . Ankle surgery     left ankle  . Wrist surgery     left  . Fasciotomy 01/10/2012    Procedure: FASCIOTOMY;  Surgeon: Budd Palmer, MD;  Location: [facility] OR;  Service: Orthopedics;  Laterality: Left;  status post fasciotomy with wound vac left calf; adjustment external fixator left tibia under fluoro; retention suture/wound vac placement left lateral calf wound  . I&d extremity 01/12/2012    Procedure: IRRIGATION AND DEBRIDEMENT EXTREMITY;  Surgeon: Budd Palmer, MD;  Location: [facility] OR;  Service: Orthopedics;  Laterality: Left;  LAYERD CLOSURE LEFT LEG WOUND  . External fixation leg 01/06/2012    Procedure: EXTERNAL FIXATION LEG;  Surgeon: Cammy Copa, MD;  Location: [facility] ORS;  Service: Orthopedics;  Laterality: Left;  Smith-nephew external fixator set  . Application of wound vac 01/06/2012    Procedure: APPLICATION OF WOUND VAC;  Surgeon: Cammy Copa, MD;  Location: [facility] ORS;  Service: Orthopedics;  Laterality: Left;  . Fasciotomy 01/06/2012    Procedure: FASCIOTOMY;  Surgeon: Cammy Copa, MD;  Location: [facility] ORS;  Service: Orthopedics;   Laterality: Left;  Four compartment fasciotomy    OT Assessment/Plan/Recommendation OT Assessment Clinical Impression Statement: This 61 y.o. male admitted for ORIF Lt. tibial plateau fracture.  Pt. will have adequate assistance at discharge.  Pt demonstrates good awareness of WB precautions.  Pt. has all DME.  All education completed.  No further OT needs identified OT Recommendation/Assessment: Patient does not need any further OT services OT Recommendation Follow Up Recommendations: No OT follow up;Supervision/Assistance - 24 hour Equipment Recommended: None recommended by OT OT Goals    OT Evaluation Precautions/Restrictions  Precautions Precautions: Fall Required Braces or Orthoses: Other Brace/Splint (Bledsoe brace) Restrictions Weight Bearing Restrictions: Yes LLE Weight Bearing: Non weight bearing Prior Functioning Home Living Lives With: Spouse Available Help at Discharge: Family Type of Home: Apartment Home Access: Stairs to enter Secretary/administrator of Steps: 2 Entrance Stairs-Rails: None Home Layout: One level Bathroom Shower/Tub: Forensic scientist: Standard Bathroom Accessibility: No Home Adaptive Equipment: Bedside commode/3-in-1;Crutches Prior Function Level of Independence: Independent with assistive device(s);Needs assistance Needs Assistance: Bathing Bath: Minimal Able to Take Stairs?: Yes Driving: No Vocation: Retired  ADL ADL Eating/Feeding: Performed;Independent Where Assessed - Eating/Feeding: Chair Grooming: Simulated;Wash/dry hands;Wash/dry face;Teeth care;Supervision/safety Where Assessed - Grooming: Standing at sink Upper Body Bathing: Simulated;Set up Where Assessed - Upper Body Bathing: Sitting, chair Lower Body Bathing: Simulated;Minimal assistance Where Assessed - Lower Body Bathing: Sit to stand from chair Upper Body Dressing: Simulated;Set up Where Assessed - Upper Body Dressing: Sitting, chair Lower Body  Dressing: Simulated;Moderate assistance Where  Assessed - Lower Body Dressing: Sit to stand from chair Toilet Transfer: Simulated;Supervision/safety Toilet Transfer Method: Proofreader: Bedside commode Toileting - Clothing Manipulation: Simulated;Supervision/safety Where Assessed - Glass blower/designer Manipulation: Standing Toileting - Hygiene: Simulated;Independent Where Assessed - Toileting Hygiene: Sit on 3-in-1 or toilet Equipment Used: Other (comment) (crutches; bledsoe brace) ADL Comments: Pt reports wife will assist him with ADLs as necessary.  He has all DME, and will sponge bathe.  Pt. able to maintain dynamic standing balance with supervision to simulate LB ADLs and grooming Vision/Perception    Cognition Cognition Overall Cognitive Status: Appears within functional limits for tasks assessed/performed Arousal/Alertness: Awake/alert Orientation Level: Appears intact for tasks assessed Behavior During Session: [facility] for tasks performed Sensation/Coordination Coordination Gross Motor Movements are Fluid and Coordinated: Yes Fine Motor Movements are Fluid and Coordinated: Yes Extremity Assessment RUE Assessment RUE Assessment: Within Functional Limits LUE Assessment LUE Assessment: Within Functional Limits Mobility  Bed Mobility Supine to Sit: 6: Modified independent (Device/Increase time) Supine to Sit Details (indicate cue type and reason): pt manages L LE well Transfers Transfers: Yes Sit to Stand: 5: Supervision Sit to Stand Details (indicate cue type and reason): initial assist for crutch management  Stand to Sit: 5: Supervision Exercises Total Joint Exercises Ankle Circles/Pumps: AROM;Both;10 reps;Seated (with LEs elevated) Quad Sets: Left;10 reps;Seated (with LEs elevated) Heel Slides: AROM;Left;10 reps;Seated (with LES elevated. pt achieved approx 20 deg of flex) End of Session OT - End of Session Equipment Utilized During Treatment:  Other (comment) (Bledsoe brace) Activity Tolerance: Patient tolerated treatment well Patient left: in chair;with call bell in reach General Behavior During Session: [facility] for tasks performed Cognition: [facility] for tasks performed   Keysha Damewood M 02/09/2012, 1:48 PM

## 2012-02-09 NOTE — Progress Notes (Signed)
Physical Therapy Treatment Note   02/09/12 1100  PT Visit Information  Last PT Received On 02/09/12  Precautions  Precautions Fall  Required Braces or Orthoses (bledsoe hinged knee brace-left open for ROM)  Restrictions  LLE Weight Bearing NWB  Cognition  Orientation Level Oriented X4  Bed Mobility  Supine to Sit 6: Modified independent (Device/Increase time)  Supine to Sit Details (indicate cue type and reason) pt manages L LE well  Transfers  Sit to Stand 5: Supervision  Sit to Stand Details (indicate cue type and reason) initial assist for crutch management   Ambulation/Gait  Ambulation/Gait Yes  Ambulation/Gait Assistance 5: Supervision  Ambulation/Gait Assistance Details (indicate cue type and reason) pt 100% compliant with L LE NWB, crutches adjusted properly for optimal support and decreased stress on shoulders  Ambulation Distance (Feet) 12 Feet (limited by L LE pain)  Assistive device Crutches  Gait Pattern Step-to pattern  Gait velocity slow, WFL for injury  Stairs Yes  Stairs Assistance 4: Min assist (contact guard)  Stairs Assistance Details (indicate cue type and reason) educated on going up stais backwards with bilat crutches to mimic home set up. pt unable to go fowards due to inabiltiy to bend L knee enough to clear step  Stair Management Technique Backwards;With crutches  Number of Stairs 2   Wheelchair Mobility  Wheelchair Mobility No  Posture/Postural Control  Posture/Postural Control No significant limitations  Balance  Balance Assessed No  Exercises  Exercises (handout provided)  Total Joint Exercises  Ankle Circles/Pumps AROM;Both;10 reps;Seated (with LEs elevated)  Quad Sets Left;10 reps;Seated (with LEs elevated)  Heel Slides AROM;Left;10 reps;Seated (with LES elevated. pt achieved approx 20 deg of flex)  PT - End of Session  Equipment Utilized During Treatment Gait belt (L bledsoe brace)  Activity Tolerance Patient limited by pain  Patient  left in chair;with call bell in reach  Nurse Communication Mobility status for transfers;Mobility status for ambulation  General  Cognition [facility] for tasks performed  PT Assessment  Clinical Impression Statement After max encouragement and report to MD and PA patient agreed to participate in PT prior to d/c. Patient demo'd safe amb with crutches and was instructed on proper stair negotiation technique. Pt reports to have wife and son at home to assist him. Patient activity tolerance limited by onset of L knee pain when L LE in dependent position. Patient beginning to tolerate L knee flexion.  PT Recommendation/Assessment Patient will need skilled PT in the acute care venue  PT Problem List Decreased strength;Decreased range of motion;Decreased activity tolerance;Decreased balance;Decreased mobility  Barriers to Discharge None  PT Therapy Diagnosis  Difficulty walking;Abnormality of gait;Generalized weakness;Acute pain  PT Plan  PT Frequency Min 5X/week  PT Treatment/Interventions Gait training;Stair training;Therapeutic exercise  PT Recommendation  Follow Up Recommendations Home health PT;Supervision - Intermittent  Equipment Recommended (has DME)  Acute Rehab PT Goals  PT Goal: Supine/Side to Sit - Progress Progressing toward goal  PT Goal: Sit to Stand - Progress Progressing toward goal  PT Transfer Goal: Bed to Chair/Chair to Bed - Progress Progressing toward goal  PT Goal: Ambulate - Progress Progressing toward goal  PT Goal: Up/Down Stairs - Progress Progressing toward goal  PT Goal: Perform Home Exercise Program - Progress Progressing toward goal    Pain: 4/10 L knee pain at rest, 10/10 L knee pain when L LE in dependent position  Lewis Shock, PT, DPT Pager #: [phone] Office #: [phone]

## 2012-02-09 NOTE — Progress Notes (Signed)
CARE MANAGEMENT NOTE 02/09/2012    Patient:  VAISHNAV, DEMARTIN   Account Number:  [id number]  Date Initiated:  02/09/2012  Documentation initiated by:  Vance Peper  Subjective/Objective Assessment:     Action/Plan:   Patient is accurently active with [facility]. Has all DME.   Anticipated DC Date:  02/09/2012   Anticipated DC Plan:  HOME W HOME HEALTH      DC Planning Services  CM consult      [facility] Choice  Resumption Of Svcs/PTA Provider   Choice offered to / List presented to:             Status of service:  Completed, signed off  Discharge Disposition:  HOME W HOME HEALTH SERVICES
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
## 2012-02-09 NOTE — Discharge Instructions (Signed)
[facility] Discharge Instructions, Pin Site and Wound Care   General Discharge Instructions  WEIGHT BEARING STATUS: Nonweightbearing left leg  RANGE OF MOTION/ACTIVITY: Range of motion as tolerated left knee and ankle. His knee brace to be on at all times except when working with physical therapy. No pillows under left knee  Diet: as you were eating previously.  Can use over the counter stool softeners and bowel preparations, such as Miralax, to help with bowel movements.  Narcotics can be constipating.  Be sure to drink plenty of fluids  STOP SMOKING OR USING NICOTINE PRODUCTS!!!!  As discussed nicotine severely impairs your body's ability to heal surgical and traumatic wounds but also impairs bone healing.  Wounds and bone heal by forming microscopic blood vessels (angiogenesis) and nicotine is a vasoconstrictor (essentially, shrinks blood vessels).  Therefore, if vasoconstriction occurs to these microscopic blood vessels they essentially disappear and are unable to deliver necessary nutrients to the healing tissue.  This is one modifiable factor that you can do to dramatically increase your chances of healing your injury.    (This means no smoking, no nicotine gum, patches, etc)  DO NOT USE NONSTEROIDAL ANTI-INFLAMMATORY DRUGS (NSAID'S)  Using products such as Advil (ibuprofen), Aleve (naproxen), Motrin (ibuprofen) for additional pain control during fracture healing can delay and/or prevent the healing response.  If you would like to take over the counter (OTC) medication, Tylenol (acetaminophen) is ok.  However, some narcotic medications that are given for pain control contain acetaminophen as well. Therefore, you should not exceed more than 4000 mg of tylenol in a day if you do not have liver disease.  Also note that there are may OTC medicines, such as cold medicines and allergy medicines that my contain tylenol as well.  If you have any questions about medications and/or  interactions please ask your doctor/PA or your pharmacist.   PAIN MEDICATION USE AND EXPECTATIONS  You have likely been given narcotic medications to help control your pain.  After a traumatic event that results in an fracture (broken bone) with or without surgery, it is ok to use narcotic pain medications to help control one's pain.  We understand that everyone responds to pain differently and each individual patient will be evaluated on a regular basis for the continued need for narcotic medications. Ideally, narcotic medication use should last no more than 6-8 weeks (coinciding with fracture healing).   As a patient it is your responsibility as well to monitor narcotic medication use and report the amount and frequency you use these medications when you come to your office visit.   We would also advise that if you are using narcotic medications, you should take a dose prior to therapy to maximize you participation.  IF YOU ARE ON NARCOTIC MEDICATIONS IT IS NOT PERMISSIBLE TO OPERATE A MOTOR VEHICLE (MOTORCYCLE/CAR/TRUCK/MOPED) OR HEAVY MACHINERY DO NOT MIX NARCOTICS WITH OTHER CNS (CENTRAL NERVOUS SYSTEM) DEPRESSANTS SUCH AS ALCOHOL       ICE AND ELEVATE INJURED/OPERATIVE EXTREMITY  Using ice and elevating the injured extremity above your heart can help with swelling and pain control.  Icing in a pulsatile fashion, such as 20 minutes on and 20 minutes off, can be followed.    Do not place ice directly on skin. Make sure there is a barrier between to skin and the ice pack.    Using frozen items such as frozen peas works well as the conform nicely to the are that needs to be iced.  USE  AN ACE WRAP OR TED HOSE FOR SWELLING CONTROL  In addition to icing and elevation, Ace wraps or TED hose are used to help limit and resolve swelling.  It is recommended to use Ace wraps or TED hose until you are informed to stop.    When using Ace Wraps start the wrapping distally (farthest away from the body) and  wrap proximally (closer to the body)   Example: If you had surgery on your leg or thing and you do not have a splint on, start the ace wrap at the toes and work your way up to the thigh        If you had surgery on your upper extremity and do not have a splint on, start the ace wrap at your fingers and work your way up to the upper arm  IF YOU ARE IN A SPLINT OR CAST DO NOT REMOVE IT FOR ANY REASON   If your splint gets wet for any reason please contact the office immediately. You may shower in your splint or cast as long as you keep it dry.  This can be done by wrapping in a cast cover or garbage back (or similar)  Do Not stick any thing down your splint or cast such as pencils, money, or hangers to try and scratch yourself with.  If you feel itchy take benadryl as prescribed on the bottle for itching  CALL THE OFFICE WITH ANY QUESTIONS OR CONCERTS: [phone]     Discharge Pin Site Instructions  Dress pins daily with Kerlix roll starting on POD 2. Wrap the Kerlix so that it tamps the skin down around the pin-skin interface to prevent/limit motion of the skin relative to the pin.  (Pin-skin motion is the primary cause of pain and infection related to external fixator pin sites).  Remove any crust or coagulum that may obstruct drainage with a saline moistened gauze or soap and water.  After POD 3, if there is no discernable drainage on the pin site dressing, the interval for change can by increased to every other day.  You may shower with the fixator, cleaning all pin sites gently with soap and water.  If you have a surgical wound this needs to be completely dry and without drainage before showering.  The extremity can be lifted by the fixator to facilitate wound care and transfers.  Notify the office/Doctor if you experience increasing drainage, redness, or pain from a pin site, or if you notice purulent (thick, snot-like) drainage.  Discharge Wound Care Instructions  Do NOT apply any  ointments, solutions or lotions to pin sites or surgical wounds.  These prevent needed drainage and even though solutions like hydrogen peroxide kill bacteria, they also damage cells lining the pin sites that help fight infection.  Applying lotions or ointments can keep the wounds moist and can cause them to breakdown and open up as well. This can increase the risk for infection. When in doubt call the office.  Surgical incisions should be dressed daily.  If any drainage is noted, use one layer of adaptic, then gauze, Kerlix, and an ace wrap.  Once the incision is completely dry and without drainage, it may be left open to air out.  Showering may begin 36-48 hours later.  Cleaning gently with soap and water.  Traumatic wounds should be dressed daily as well.    One layer of adaptic, gauze, Kerlix, then ace wrap.  The adaptic can be discontinued once the draining has ceased  If you have a wet to dry dressing: wet the gauze with saline the squeeze as much saline out so the gauze is moist (not soaking wet), place moistened gauze over wound, then place a dry gauze over the moist one, followed by Kerlix wrap, then ace wrap.

## 2012-02-10 ENCOUNTER — Encounter (HOSPITAL_COMMUNITY): Payer: Self-pay | Admitting: Orthopedic Surgery

## 2012-02-11 LAB — BODY FLUID CULTURE: Culture: NO GROWTH

## 2012-02-12 LAB — ANAEROBIC CULTURE

## 2012-05-29 ENCOUNTER — Other Ambulatory Visit: Payer: Self-pay | Admitting: Family Medicine

## 2012-06-04 ENCOUNTER — Other Ambulatory Visit: Payer: Self-pay | Admitting: *Deleted

## 2012-06-04 MED ORDER — ALBUTEROL SULFATE HFA 108 (90 BASE) MCG/ACT IN AERS: 2.0000 | INHALATION_SPRAY | RESPIRATORY_TRACT | Status: DC | PRN

## 2012-09-24 ENCOUNTER — Other Ambulatory Visit: Payer: Self-pay | Admitting: Family Medicine

## 2013-01-25 ENCOUNTER — Other Ambulatory Visit: Payer: Self-pay | Admitting: Family Medicine

## 2013-02-02 ENCOUNTER — Other Ambulatory Visit: Payer: Self-pay | Admitting: Family Medicine

## 2013-02-07 ENCOUNTER — Other Ambulatory Visit: Payer: Self-pay | Admitting: Family Medicine

## 2013-02-11 ENCOUNTER — Other Ambulatory Visit: Payer: Self-pay | Admitting: Family Medicine

## 2013-02-13 ENCOUNTER — Other Ambulatory Visit: Payer: Self-pay | Admitting: Family Medicine

## 2013-02-13 ENCOUNTER — Ambulatory Visit (INDEPENDENT_AMBULATORY_CARE_PROVIDER_SITE_OTHER): Payer: Medicare Other | Admitting: Family Medicine

## 2013-02-13 ENCOUNTER — Encounter: Payer: Self-pay | Admitting: Family Medicine

## 2013-02-13 VITALS — BP 139/74 | HR 88 | Temp 97.9°F | Ht 71.0 in | Wt 151.0 lb

## 2013-02-13 DIAGNOSIS — J449 Chronic obstructive pulmonary disease, unspecified: Secondary | ICD-10-CM

## 2013-02-13 DIAGNOSIS — J441 Chronic obstructive pulmonary disease with (acute) exacerbation: Secondary | ICD-10-CM | POA: Insufficient documentation

## 2013-02-13 DIAGNOSIS — F17200 Nicotine dependence, unspecified, uncomplicated: Secondary | ICD-10-CM

## 2013-02-13 DIAGNOSIS — R040 Epistaxis: Secondary | ICD-10-CM | POA: Insufficient documentation

## 2013-02-13 DIAGNOSIS — E785 Hyperlipidemia, unspecified: Secondary | ICD-10-CM | POA: Insufficient documentation

## 2013-02-13 DIAGNOSIS — I10 Essential (primary) hypertension: Secondary | ICD-10-CM | POA: Insufficient documentation

## 2013-02-13 LAB — CBC
HCT: 38.6 % — ABNORMAL LOW (ref 39.0–52.0)
MCH: 32.1 pg (ref 26.0–34.0)
MCHC: 35 g/dL (ref 30.0–36.0)
MCV: 91.9 fL (ref 78.0–100.0)
RDW: 14.2 % (ref 11.5–15.5)

## 2013-02-13 LAB — COMPREHENSIVE METABOLIC PANEL
AST: 31 U/L (ref 0–37)
Alkaline Phosphatase: 86 U/L (ref 39–117)
BUN: 10 mg/dL (ref 6–23)
Calcium: 10.2 mg/dL (ref 8.4–10.5)
Chloride: 105 mEq/L (ref 96–112)
Creat: 0.85 mg/dL (ref 0.50–1.35)

## 2013-02-13 MED ORDER — ALBUTEROL SULFATE HFA 108 (90 BASE) MCG/ACT IN AERS: INHALATION_SPRAY | RESPIRATORY_TRACT | Status: DC

## 2013-02-13 MED ORDER — TIOTROPIUM BROMIDE MONOHYDRATE 18 MCG IN CAPS: ORAL_CAPSULE | RESPIRATORY_TRACT | Status: DC

## 2013-02-13 NOTE — Assessment & Plan Note (Signed)
Last LDL elevated. NOw off meds. Will check direct LDL today. If elevated will likely need to go back on a statin. Will call patient with the result. He understands this may be the case. If so, we will need to check his liver function at next visit.

## 2013-02-13 NOTE — Progress Notes (Signed)
Patient ID: Lucas [name], male   DOB: Apr 25, 1951, 63 y.o.   MRN: [id number]  [name] Jibran Crookshanks M. Laresa Oshiro, MD Phone: [phone]   Subjective: HPI: Patient is a 62 y.o. male presenting to clinic today for follow up appointment. Concerns today include nose bleeds  1. Hypertension Blood pressure at home: Does not check. Was told that he did not have elevated BP at physical therapy. Blood pressure today:  139/74 Taking Meds: No, has not been on HCTZ in a long time ROS: Denies headache, visual changes, nausea, vomiting, chest pain, abdominal pain or shortness of breath.  2. HLD Patient with history of high cholesterol. Has not taken any medication in years and was told that he did not need it. His last lipid panel was in March 2013 and was elevated with LDL of 123. He does not control his diet.  3. Nose bleeds History of seasonal allergies. Has been on allergy pill before that caused upset stomach. He also has been on flonase in the past which made his nose bleeds worse. He states his nose bleeds in the morning when he gets up, he feels like it is because it is so dry because of seasonal allergies.  4. COPD Smokes daily, but cutting back. Uses Spiriva and Albuterol prn. Out of both of these and needs refills. He has not had any problems lately.  History Reviewed: Every day smoker. Health Maintenance: UTD  ROS: Please see HPI above.  Objective: Office vital signs reviewed. BP 139/74  Pulse 88  Temp(Src) 97.9 F (36.6 C) (Oral)  Ht 5\' 11"  (1.803 m)  Wt 151 lb (68.493 kg)  BMI 21.07 kg/m2  Physical Examination:  General: Awake, alert. NAD. Very pleasant HEENT: Atraumatic, normocephalic. Some horizontal nystagmus with regular eye movements. Neck: No masses palpated. No LAD. No bruit appreciated Pulm: CTAB, no wheezes. Sounds tight at bases but no rales. Cardio: RRR, no murmurs appreciated Abdomen:+BS, soft, nontender, nondistended Extremities: No edema,  diffuse dry skin Neuro: Grossly intact  Assessment: 62 y.o. male follow up appointment  Plan: See Problem List and After Visit Summary

## 2013-02-13 NOTE — Assessment & Plan Note (Signed)
Likely due to allergies. Cannot tolerate allergy treatment. Should use saline nasal spray to keep membranes moist.

## 2013-02-13 NOTE — Assessment & Plan Note (Signed)
BP at goal off medications. Will check labs today and closely monitor BP. No new meds prescribed today.

## 2013-02-13 NOTE — Patient Instructions (Addendum)
It was nice to meet you today!  I have refilled your inhalers at the [facility]. Try to stop smoking completely. You're doing a good job cutting back!  Use normal saline nasal spray (you can ask your pharmacist) to use for your dry nose.  We will check your labs today. I will call you if there is anything abnormal. Otherwise, I will send you a letter.  Call me if you need anything, or I will see you in 6 months for a check up.  Emalyn Schou M. Kaicen Desena, M.D.

## 2013-02-13 NOTE — Assessment & Plan Note (Signed)
Encouraged to stop smoking. Continue Spiriva and albuterol. Could likely benefit from PFT but I failed to do that today.

## 2013-02-14 ENCOUNTER — Other Ambulatory Visit: Payer: Self-pay | Admitting: Family Medicine

## 2013-02-15 ENCOUNTER — Encounter: Payer: Self-pay | Admitting: Family Medicine

## 2013-02-26 ENCOUNTER — Other Ambulatory Visit: Payer: Self-pay | Admitting: Family Medicine

## 2013-03-27 ENCOUNTER — Other Ambulatory Visit: Payer: Self-pay | Admitting: *Deleted

## 2013-03-27 DIAGNOSIS — J449 Chronic obstructive pulmonary disease, unspecified: Secondary | ICD-10-CM

## 2013-03-27 MED ORDER — TIOTROPIUM BROMIDE MONOHYDRATE 18 MCG IN CAPS: ORAL_CAPSULE | RESPIRATORY_TRACT | Status: DC

## 2013-03-27 NOTE — Telephone Encounter (Signed)
Requested Prescriptions   Pending Prescriptions Disp Refills  . tiotropium (SPIRIVA HANDIHALER) 18 MCG inhalation capsule 90 capsule 2    Sig: PLACE 1 CAPSULE INTO INHALER AND INHALE DAILY AS DIRECTED   Wyatt Haste, RN-BSN

## 2013-06-03 ENCOUNTER — Telehealth: Payer: Self-pay | Admitting: Family Medicine

## 2013-06-03 NOTE — Telephone Encounter (Signed)
Will forward to MD. Griffin Gerrard,CMA  

## 2013-06-03 NOTE — Telephone Encounter (Signed)
Pt is requesting a refill on his albuterol be sent to his pharmacy. JW

## 2013-06-03 NOTE — Telephone Encounter (Signed)
Please have patient call their pharmacy to request any refills, including this one.  Thanks. Rabia Argote M. Karyssa Amaral, M.D.

## 2013-09-02 ENCOUNTER — Encounter: Payer: Self-pay | Admitting: Family Medicine

## 2013-09-02 ENCOUNTER — Ambulatory Visit (INDEPENDENT_AMBULATORY_CARE_PROVIDER_SITE_OTHER): Payer: Medicare Other | Admitting: Family Medicine

## 2013-09-02 VITALS — BP 136/80 | HR 100 | Temp 98.4°F | Ht 71.0 in | Wt 147.0 lb

## 2013-09-02 DIAGNOSIS — Z23 Encounter for immunization: Secondary | ICD-10-CM

## 2013-09-02 DIAGNOSIS — J449 Chronic obstructive pulmonary disease, unspecified: Secondary | ICD-10-CM

## 2013-09-02 DIAGNOSIS — E785 Hyperlipidemia, unspecified: Secondary | ICD-10-CM

## 2013-09-02 DIAGNOSIS — I10 Essential (primary) hypertension: Secondary | ICD-10-CM

## 2013-09-02 MED ORDER — ALBUTEROL SULFATE HFA 108 (90 BASE) MCG/ACT IN AERS: 2.0000 | INHALATION_SPRAY | RESPIRATORY_TRACT | Status: DC | PRN

## 2013-09-02 MED ORDER — TIOTROPIUM BROMIDE MONOHYDRATE 18 MCG IN CAPS: ORAL_CAPSULE | RESPIRATORY_TRACT | Status: DC

## 2013-09-02 NOTE — Patient Instructions (Signed)
It was good to see you!  Think about starting Lipitor for your cholesterol. I will call the pharmacy about your ProAir. I have sent your refills in for you.  I will see you back in April, or sooner if you need anything!  Sylar Voong M. Tyese Finken, M.D.

## 2013-09-02 NOTE — Progress Notes (Signed)
Patient ID: Lucas [name], male   DOB: May 02, 1951, 62 y.o.   MRN: [id number]  [name] Lucas Hoffmeister M. Nichoals Heyde, MD Phone: [phone]   Subjective: HPI: Patient is a 62 y.o. male presenting to clinic today for follow up appointment.  1. COPD - Stable. Using Spiriva daily, and albuterol as needed. Has not used lately. Not on any allergy medicine. Got through allergy season with no problems. Cutting back on smoking.   2. Hypertension Blood pressure today: 170/90 Taking Meds: Not currently on medication. Was on BP meds in the past which he did not tolerate well so was taken off, and prefers to stay off at this time. ROS: Denies headache, visual changes, nausea, vomiting, chest pain, abdominal pain or shortness of breath.  3. HLD Diagnosed in the past, but not currently on Lipitor. Last LDL was 150, and I have recommended he restart medication. He states he "had rather not" but will think about it. Will be due for labs in April, so we will discuss it more at that time.  History Reviewed: Daily smoker. Health Maintenance: Flu shot today  ROS: Please see HPI above.  Objective: Office vital signs reviewed. BP 170/90  Pulse 100  Temp(Src) 98.4 F (36.9 C) (Oral)  Ht 5\' 11"  (1.803 m)  Wt 147 lb (66.679 kg)  BMI 20.51 kg/m2  Physical Examination:  General: Awake, alert. NAD HEENT: Atraumatic, normocephalic. Nystagmus (baseline) Neck: No masses palpated. No LAD Pulm: CTAB, no wheezes. Good effort Cardio: RRR, no murmurs appreciated Abdomen:+BS, soft, nontender, nondistended Extremities: No edema Neuro: Grossly intact  Assessment: 62 y.o. male follow up  Plan: See Problem List and After Visit Summary

## 2013-09-03 NOTE — Assessment & Plan Note (Signed)
Con't Spiriva. Also, will sent in Rx for Ventolin which has a counter on the inhaler so hopefully that will be easier for him. Use Albuterol as needed. Follow up in 6 months, or sooner if needed.

## 2013-09-03 NOTE — Assessment & Plan Note (Signed)
At goal, con't to monitor. F/u in 6 months or sooner if needed.

## 2013-09-03 NOTE — Assessment & Plan Note (Signed)
LDL still elevated. Does not want to restart statin at this time. Explained to him the cardiac implications of having HLD, HTN and smoking and I recommend he restarts Lipitor. He states he will think about it and let me know.

## 2014-01-28 ENCOUNTER — Encounter: Payer: Self-pay | Admitting: Family Medicine

## 2014-01-28 ENCOUNTER — Ambulatory Visit (INDEPENDENT_AMBULATORY_CARE_PROVIDER_SITE_OTHER): Payer: Medicare Other | Admitting: Family Medicine

## 2014-01-28 VITALS — BP 169/97 | HR 114 | Temp 98.3°F | Ht 71.0 in | Wt 150.0 lb

## 2014-01-28 DIAGNOSIS — J449 Chronic obstructive pulmonary disease, unspecified: Secondary | ICD-10-CM

## 2014-01-28 DIAGNOSIS — I10 Essential (primary) hypertension: Secondary | ICD-10-CM

## 2014-01-28 LAB — CBC
HEMATOCRIT: 39.8 % (ref 39.0–52.0)
Hemoglobin: 14.4 g/dL (ref 13.0–17.0)
MCH: 31.2 pg (ref 26.0–34.0)
MCHC: 36.2 g/dL — AB (ref 30.0–36.0)
MCV: 86.3 fL (ref 78.0–100.0)
PLATELETS: 240 10*3/uL (ref 150–400)
RBC: 4.61 MIL/uL (ref 4.22–5.81)
RDW: 13.3 % (ref 11.5–15.5)
WBC: 3.8 10*3/uL — AB (ref 4.0–10.5)

## 2014-01-28 MED ORDER — HYDROCHLOROTHIAZIDE 25 MG PO TABS: 25.0000 mg | ORAL_TABLET | Freq: Every day | ORAL | Status: DC

## 2014-01-28 MED ORDER — TIOTROPIUM BROMIDE MONOHYDRATE 18 MCG IN CAPS: ORAL_CAPSULE | RESPIRATORY_TRACT | Status: DC

## 2014-01-28 NOTE — Patient Instructions (Signed)
I will let you know about your blood work. Start taking HCTZ 25mg  daily.  Let me know if you have any problems, you can call me to let me know.  Lets recheck your blood pressure near the end of May or early June.  Rafi Kenneth M. Doris Mcgilvery, M.D.

## 2014-01-28 NOTE — Progress Notes (Signed)
Patient ID: Lucas [name], male   DOB: 11-14-1950, 63 y.o.   MRN: [id number]    Subjective: HPI: Patient is a 63 y.o. male presenting to clinic today for follow up appointment. Concerns today include HTN and nosebleeds  1. HTN: Patient with known HTN was taken off of blood pressure pills due to intolerance. He is concerned that he is having nose bleeds in the mornings. No chest pain, headache, syncope. He drinks 2-3 large cans of beer per day.   2. COPD: Stable. Down to 1/2 ppd smoking. No SOB or problems breathing. Still doing Spiriva daily and Albuterol prn. He does have hay fever and has been using nasal spray some.   History Reviewed: Daily smoker. Health Maintenance: Had flu shot. Will need labs today.  ROS: Please see HPI above.  Objective: Office vital signs reviewed. BP 169/97  Pulse 114  Temp(Src) 98.3 F (36.8 C) (Oral)  Ht 5\' 11"  (1.803 m)  Wt 150 lb (68.04 kg)  BMI 20.93 kg/m2  Physical Examination:  General: Awake, alert. NAD HEENT: Atraumatic, normocephalic. MMM Pulm: CTAB, no wheezes. Good effort Cardio: RRR, no murmurs appreciated Abdomen:+BS, soft, nontender, nondistended Neuro: Strength and sensation grossly intact  Assessment: 63 y.o. male follow up  Plan: See Problem List and After Visit Summary

## 2014-01-28 NOTE — Assessment & Plan Note (Signed)
A: Stable. No wheezes today.  P: - Con't Spiriva and Albuterol prn - Consider PFT in future, especially if worsens functionally - Cutting back on smoking, encouraged to stop - F/u prn

## 2014-01-28 NOTE — Assessment & Plan Note (Signed)
A: BP elevated today. He has not been able to tolerate medications in the past, but states he is ready to start something now.   P: - Labs today, including lipid - Encouraged to decrease alcohol intake - Start HCTZ 25mg  - F/u in 4-8 weeks for BP check - Let me know if he is not tolerating medication

## 2014-01-29 LAB — COMPREHENSIVE METABOLIC PANEL
ALK PHOS: 85 U/L (ref 39–117)
ALT: 26 U/L (ref 0–53)
AST: 53 U/L — AB (ref 0–37)
Albumin: 4.6 g/dL (ref 3.5–5.2)
BUN: 5 mg/dL — AB (ref 6–23)
CHLORIDE: 100 meq/L (ref 96–112)
CO2: 24 mEq/L (ref 19–32)
CREATININE: 0.9 mg/dL (ref 0.50–1.35)
Calcium: 9.8 mg/dL (ref 8.4–10.5)
Glucose, Bld: 75 mg/dL (ref 70–99)
POTASSIUM: 4.3 meq/L (ref 3.5–5.3)
Sodium: 138 mEq/L (ref 135–145)
Total Bilirubin: 0.5 mg/dL (ref 0.2–1.2)
Total Protein: 7.7 g/dL (ref 6.0–8.3)

## 2014-01-29 LAB — LIPID PANEL
CHOL/HDL RATIO: 2.6 ratio
Cholesterol: 223 mg/dL — ABNORMAL HIGH (ref 0–200)
HDL: 85 mg/dL (ref >39)
LDL CALC: 125 mg/dL — AB (ref 0–99)
Triglycerides: 65 mg/dL (ref <150)
VLDL: 13 mg/dL (ref 0–40)

## 2014-02-10 ENCOUNTER — Encounter: Payer: Self-pay | Admitting: Family Medicine

## 2014-03-21 ENCOUNTER — Ambulatory Visit: Payer: Medicare Other | Admitting: Family Medicine

## 2014-04-01 ENCOUNTER — Ambulatory Visit: Payer: Medicare Other | Admitting: Family Medicine

## 2014-04-08 ENCOUNTER — Emergency Department (HOSPITAL_COMMUNITY)
Admission: EM | Admit: 2014-04-08 | Discharge: 2014-04-08 | Disposition: A | Discharge status: Home / Self Care | Payer: Medicare HMO | Attending: Emergency Medicine | Admitting: Emergency Medicine

## 2014-04-08 ENCOUNTER — Emergency Department (HOSPITAL_COMMUNITY): Payer: Medicare HMO

## 2014-04-08 ENCOUNTER — Encounter (HOSPITAL_COMMUNITY): Payer: Self-pay | Admitting: Emergency Medicine

## 2014-04-08 DIAGNOSIS — IMO0002 Reserved for concepts with insufficient information to code with codable children: Secondary | ICD-10-CM | POA: Insufficient documentation

## 2014-04-08 DIAGNOSIS — Z79899 Other long term (current) drug therapy: Secondary | ICD-10-CM | POA: Insufficient documentation

## 2014-04-08 DIAGNOSIS — Z862 Personal history of diseases of the blood and blood-forming organs and certain disorders involving the immune mechanism: Secondary | ICD-10-CM | POA: Insufficient documentation

## 2014-04-08 DIAGNOSIS — Z8639 Personal history of other endocrine, nutritional and metabolic disease: Secondary | ICD-10-CM | POA: Insufficient documentation

## 2014-04-08 DIAGNOSIS — S43016A Anterior dislocation of unspecified humerus, initial encounter: Secondary | ICD-10-CM | POA: Insufficient documentation

## 2014-04-08 DIAGNOSIS — Z880 Allergy status to penicillin: Secondary | ICD-10-CM | POA: Insufficient documentation

## 2014-04-08 DIAGNOSIS — T07XXXA Unspecified multiple injuries, initial encounter: Secondary | ICD-10-CM

## 2014-04-08 DIAGNOSIS — Y92410 Unspecified street and highway as the place of occurrence of the external cause: Secondary | ICD-10-CM | POA: Insufficient documentation

## 2014-04-08 DIAGNOSIS — Y9389 Activity, other specified: Secondary | ICD-10-CM | POA: Insufficient documentation

## 2014-04-08 DIAGNOSIS — J4489 Other specified chronic obstructive pulmonary disease: Secondary | ICD-10-CM | POA: Insufficient documentation

## 2014-04-08 DIAGNOSIS — I10 Essential (primary) hypertension: Secondary | ICD-10-CM | POA: Insufficient documentation

## 2014-04-08 DIAGNOSIS — S43014A Anterior dislocation of right humerus, initial encounter: Secondary | ICD-10-CM

## 2014-04-08 DIAGNOSIS — J449 Chronic obstructive pulmonary disease, unspecified: Secondary | ICD-10-CM | POA: Insufficient documentation

## 2014-04-08 DIAGNOSIS — F17200 Nicotine dependence, unspecified, uncomplicated: Secondary | ICD-10-CM | POA: Insufficient documentation

## 2014-04-08 MED ORDER — MORPHINE SULFATE 4 MG/ML IJ SOLN
4.0000 mg | Freq: Once | INTRAMUSCULAR | Status: AC
  Administered 2014-04-08: 4 mg via INTRAVENOUS
  Filled 2014-04-08: qty 1

## 2014-04-08 MED ORDER — MORPHINE SULFATE 4 MG/ML IJ SOLN
4.0000 mg | Freq: Once | INTRAMUSCULAR | Status: AC
  Administered 2014-04-08: 4 mg via INTRAVENOUS

## 2014-04-08 MED ORDER — PROPOFOL 10 MG/ML IV BOLUS
INTRAVENOUS | Status: AC
  Filled 2014-04-08: qty 20

## 2014-04-08 MED ORDER — PROPOFOL 10 MG/ML IV BOLUS
INTRAVENOUS | Status: AC | PRN
  Administered 2014-04-08: 50 mg via INTRAVENOUS
  Administered 2014-04-08: 30 mg via INTRAVENOUS

## 2014-04-08 MED ORDER — MORPHINE SULFATE 4 MG/ML IJ SOLN
4.0000 mg | Freq: Once | INTRAMUSCULAR | Status: DC
Start: 2014-04-08 — End: 2014-04-08
  Filled 2014-04-08: qty 1

## 2014-04-08 MED ORDER — ONDANSETRON 4 MG PO TBDP
4.0000 mg | ORAL_TABLET | Freq: Once | ORAL | Status: DC
  Filled 2014-04-08: qty 1

## 2014-04-08 MED ORDER — HYDROCODONE-ACETAMINOPHEN 5-325 MG PO TABS: 1.0000 | ORAL_TABLET | ORAL | Status: DC | PRN

## 2014-04-08 MED ORDER — ONDANSETRON 4 MG PO TBDP: ORAL_TABLET | ORAL | Status: DC

## 2014-04-08 MED ORDER — ONDANSETRON HCL 4 MG/2ML IJ SOLN
4.0000 mg | Freq: Once | INTRAMUSCULAR | Status: AC
  Administered 2014-04-08: 4 mg via INTRAVENOUS

## 2014-04-08 MED ORDER — ONDANSETRON HCL 4 MG/2ML IJ SOLN
INTRAMUSCULAR | Status: AC
  Filled 2014-04-08: qty 2
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
## 2014-04-08 NOTE — Discharge Instructions (Signed)
Abrasion An abrasion is a cut or scrape of the skin. Abrasions do not extend through all layers of the skin and most heal within 10 days. It is important to care for your abrasion properly to prevent infection. CAUSES  Most abrasions are caused by falling on, or gliding across, the ground or other surface. When your skin rubs on something, the outer and inner layer of skin rubs off, causing an abrasion. DIAGNOSIS  Your caregiver will be able to diagnose an abrasion during a physical exam.  TREATMENT  Your treatment depends on how large and deep the abrasion is. Generally, your abrasion will be cleaned with water and a mild soap to remove any dirt or debris. An antibiotic ointment may be put over the abrasion to prevent an infection. A bandage (dressing) may be wrapped around the abrasion to keep it from getting dirty.  You may need a tetanus shot if:  You cannot remember when you had your last tetanus shot.  You have never had a tetanus shot.  The injury broke your skin. If you get a tetanus shot, your arm may swell, get red, and feel warm to the touch. This is common and not a problem. If you need a tetanus shot and you choose not to have one, there is a rare chance of getting tetanus. Sickness from tetanus can be serious.  HOME CARE INSTRUCTIONS   If a dressing was applied, change it at least once a day or as directed by your caregiver. If the bandage sticks, soak it off with warm water.   Wash the area with water and a mild soap to remove all the ointment 2 times a day. Rinse off the soap and pat the area dry with a clean towel.   Reapply any ointment as directed by your caregiver. This will help prevent infection and keep the bandage from sticking. Use gauze over the wound and under the dressing to help keep the bandage from sticking.   Change your dressing right away if it becomes wet or dirty.   Only take over-the-counter or prescription medicines for pain, discomfort, or fever as  directed by your caregiver.   Follow up with your caregiver within 24 48 hours for a wound check, or as directed. If you were not given a wound-check appointment, look closely at your abrasion for redness, swelling, or pus. These are signs of infection. SEEK IMMEDIATE MEDICAL CARE IF:   You have increasing pain in the wound.   You have redness, swelling, or tenderness around the wound.   You have pus coming from the wound.   You have a fever or persistent symptoms for more than 2 3 days.  You have a fever and your symptoms suddenly get worse.  You have a bad smell coming from the wound or dressing.  MAKE SURE YOU:   Understand these instructions.  Will watch your condition.  Will get help right away if you are not doing well or get worse. Document Released: 07/20/2005 Document Revised: 09/26/2012 Document Reviewed: 09/13/2011 [facility] Patient Information 2014 [place], [place].  Shoulder Dislocation Your shoulder is made up of three bones: the collar bone (clavicle); the shoulder blade (scapula), which includes the socket (glenoid cavity); and the upper arm bone (humerus). Your shoulder joint is the place where these bones meet. Strong, fibrous tissues hold these bones together (ligaments). Muscles and strong, fibrous tissues that connect the muscles to these bones (tendons) allow your arm to move through this joint. The range of  motion of your shoulder joint is more extensive than most of your other joints, and the glenoid cavity is very shallow. That is the reason that your shoulder joint is one of the most unstable joints in your body. It is far more prone to dislocation than your other joints. Shoulder dislocation is when your humerus is forced out of your shoulder joint. CAUSES Shoulder dislocation is caused by a forceful impact on your shoulder. This impact usually is from an injury, such as a sports injury or a fall. SYMPTOMS Symptoms of shoulder dislocation  include:  Deformity of your shoulder.  Intense pain.  Inability to move your shoulder joint.  Numbness, weakness, or tingling around your shoulder joint (your neck or down your arm).  Bruising or swelling around your shoulder. DIAGNOSIS In order to diagnose a dislocated shoulder, your caregiver will perform a physical exam. Your caregiver also may have an X-ray exam done to see if you have any broken bones. Magnetic resonance imaging (MRI) is a procedure that sometimes is done to help your caregiver see any damage to the soft tissues around your shoulder, particularly your rotator cuff tendons. Additionally, your caregiver also may have electromyography done to measure the electrical discharges produced in your muscles if you have signs or symptoms of nerve damage. TREATMENT A shoulder dislocation is treated by placing the humerus back in the joint (reduction). Your caregiver does this either manually (closed reduction), by moving your humerus back into the joint through manipulation, or through surgery (open reduction). When your humerus is back in place, severe pain should improve almost immediately. You also may need to have surgery if you have a weak shoulder joint or ligaments, and you have recurring shoulder dislocations, despite rehabilitation. In rare cases, surgery is necessary if your nerves or blood vessels are damaged during the dislocation. After your reduction, your arm will be placed in a shoulder immobilizer or sling to keep it from moving. Your caregiver will have you wear your shoulder immoblizer or sling for 3 days to 3 weeks, depending on how serious your dislocation is. When your shoulder immobilizer or sling is removed, your caregiver may prescribe physical therapy to help improve the range of motion in your shoulder joint. HOME CARE INSTRUCTIONS  The following measures can help to reduce pain and speed up the healing process:  Rest your injured joint. Do not move it. Avoid  activities similar to the one that caused your injury.  Apply ice to your injured joint for the first day or two after your reduction or as directed by your caregiver. Applying ice helps to reduce inflammation and pain.  Put ice in a plastic bag.  Place a towel between your skin and the bag.  Leave the ice on for 15-20 minutes at a time, every 2 hours while you are awake.  Exercise your hand by squeezing a soft ball. This helps to eliminate stiffness and swelling in your hand and wrist.  Take over-the-counter or prescription medicine for pain or discomfort as told by your caregiver. SEEK IMMEDIATE MEDICAL CARE IF:   Your shoulder immobilizer or sling becomes damaged.  Your pain becomes worse rather than better.  You lose feeling in your arm or hand, or they become white and cold. MAKE SURE YOU:   Understand these instructions.  Will watch your condition.  Will get help right away if you are not doing well or get worse. Document Released: 07/05/2001 Document Revised: 01/02/2012 Document Reviewed: 07/31/2011 [employer] Patient Information 2014 [employer]. ° °

## 2014-04-08 NOTE — ED Notes (Signed)
MD at bedside. 

## 2014-04-08 NOTE — ED Notes (Signed)
MD Lita Mains at bedside.

## 2014-04-08 NOTE — ED Notes (Signed)
belongings given to pt and wife

## 2014-08-28 ENCOUNTER — Other Ambulatory Visit: Payer: Self-pay | Admitting: *Deleted

## 2014-08-29 MED ORDER — ALBUTEROL SULFATE HFA 108 (90 BASE) MCG/ACT IN AERS
2.0000 | INHALATION_SPRAY | RESPIRATORY_TRACT | Status: DC | PRN
Start: 2014-08-29 — End: 2015-09-30

## 2014-08-29 NOTE — Telephone Encounter (Signed)
2nd request for refill of Ventolin inhaler from [facility].  Derl Barrow, RN

## 2014-09-04 ENCOUNTER — Encounter: Payer: Self-pay | Admitting: Obstetrics and Gynecology

## 2014-09-04 ENCOUNTER — Ambulatory Visit (INDEPENDENT_AMBULATORY_CARE_PROVIDER_SITE_OTHER): Payer: Medicare HMO | Admitting: Obstetrics and Gynecology

## 2014-09-04 VITALS — BP 158/84 | HR 96 | Temp 98.6°F | Ht 71.0 in | Wt 144.0 lb

## 2014-09-04 DIAGNOSIS — Z23 Encounter for immunization: Secondary | ICD-10-CM

## 2014-09-04 DIAGNOSIS — J449 Chronic obstructive pulmonary disease, unspecified: Secondary | ICD-10-CM

## 2014-09-04 DIAGNOSIS — E785 Hyperlipidemia, unspecified: Secondary | ICD-10-CM

## 2014-09-04 DIAGNOSIS — F17200 Nicotine dependence, unspecified, uncomplicated: Secondary | ICD-10-CM

## 2014-09-04 DIAGNOSIS — I10 Essential (primary) hypertension: Secondary | ICD-10-CM

## 2014-09-04 DIAGNOSIS — Z720 Tobacco use: Secondary | ICD-10-CM

## 2014-09-04 MED ORDER — SIMVASTATIN 40 MG PO TABS: 40.0000 mg | ORAL_TABLET | Freq: Every day | ORAL | Status: DC

## 2014-09-04 NOTE — Assessment & Plan Note (Signed)
BP today 158/84. Will continue to monitor pressures. Was just started on HCTZ in April. Patient instructed to check pressures at home and monitor. Will consider adjusting medications when he returns in 3 months. Al[place]so consider checking potassium at that time.

## 2014-09-04 NOTE — Progress Notes (Signed)
     Subjective: Chief Complaint  Patient presents with  . Follow-up    HPI: Lucas [name] is a 63 y.o. presenting to clinic today to discuss the following:  #Hypertension Blood pressure at home: Patient does not take pressures at home.  Blood pressure today: 158/84 Taking Meds: Yes Side effects: none ROS: Denies headache, dizziness, visual changes, nausea, vomiting, chest pain, abdominal pain or shortness of breath.  #Tobacco Dependence A little over a half a pack a day now. At one point he was on 3ppd. He is decreasing and is motivated to quit. He has tried nicotine patches in the past but that actually made him "smoke more". He says it is a habit now. He has to have his "coffee and cigs".   #Hyperlipidemia Used to be on Lipitor but said it made him feel funny. He knows that he should be taking it for his cholesterol.  #COPD Patient heard on TV that there is a new pill-less Spiriva. He wants his next refill to be this kind. He denies any exacerbations of flares in his COPD.    Health Maintenance Due: Flu and colonoscopy.   All systems were reviewed and were negative unless otherwise noted in the HPI Past Medical, Surgical, Social, and Family History Reviewed & Updated per EMR.   Objective: BP 158/84 mmHg  Pulse 96  Temp(Src) 98.6 F (37 C) (Oral)  Ht 5\' 11"  (1.803 m)  Wt 144 lb (65.318 kg)  BMI 20.09 kg/m2  SpO2 97%  General: alert, well-developed, NAD, cooperative Lungs: CTAB, normal respiratory effort, no crackles, and no wheezes. Heart: RRR, no M/R/G.  Extremities: No cyanosis, clubbing, edema. Neurologic: No focal deficits, A&Ox3.  Skin: Intact without suspicious lesions or rashes. Warm and dry.   Assessment/Plan: Please see problem based Assessment and Plan  Health Maintainance: Given flu and pneumovax 23 today.  Patient wants to wait on getting colonoscopy. He says he will think about it.    Luiz Blare, DO 09/04/2014, 2:07 PM PGY-1, [place]

## 2014-09-04 NOTE — Assessment & Plan Note (Signed)
Patient started on Zocor. Will reaccess at next visit.

## 2014-09-04 NOTE — Assessment & Plan Note (Signed)
Stable and well. Takes Spiriva daily and Albuterol as needed. No need to step-up care at this time. Will refill Spiriva at next visit (aerosilized). Encouraged smoking cessation again.

## 2014-09-04 NOTE — Assessment & Plan Note (Addendum)
Patient encouraged to continue cutting back on tobacco use. Says he does not want any supplementation to help with this (ie. Patch). We discussed ways to cut back and encouraged him that this would be good for his health. Will consider low-dose CT scan for lung cancer screening at next visit. 25 ppy.

## 2014-09-04 NOTE — Patient Instructions (Addendum)
Lucas [name] it was great to see you today!  I am pleased to hear that things are going well for you.  Here are some of the things we discussed today: -Continue to work on tobacco cessation. That will be in the best interest of your health -I started you back on a cholesterol medication. Please take daily -I am not going to change your BP medication today. However I want you to monitor it at home and record some values for me. -In 3 months you should be out of your Spiriva so when you come for follow-up I will prescribe you the new aerosolized kind.   New medications: Simvastatin (Zocor)   Please schedule a follow-up appointment for 3 months.    Thanks for allowing me to be a part of your care! Dr. Gerarda Fraction

## 2014-09-07 ENCOUNTER — Ambulatory Visit (INDEPENDENT_AMBULATORY_CARE_PROVIDER_SITE_OTHER): Payer: Medicare HMO | Admitting: *Deleted

## 2014-09-07 DIAGNOSIS — Z23 Encounter for immunization: Secondary | ICD-10-CM

## 2014-09-07 MED ORDER — PNEUMOCOCCAL VAC POLYVALENT 25 MCG/0.5ML IJ INJ: 0.5000 mL | INJECTION | INTRAMUSCULAR | Status: DC

## 2014-09-07 NOTE — Addendum Note (Signed)
Addended by: Burna Forts A on: 09/07/2014 10:23 PM   Modules accepted: Orders

## 2014-09-09 NOTE — Addendum Note (Signed)
Addended by: Burna Forts A on: 09/09/2014 09:02 AM   Modules accepted: Orders

## 2014-12-31 ENCOUNTER — Encounter: Payer: Self-pay | Admitting: Obstetrics and Gynecology

## 2014-12-31 ENCOUNTER — Ambulatory Visit (INDEPENDENT_AMBULATORY_CARE_PROVIDER_SITE_OTHER): Payer: Medicare HMO | Admitting: Obstetrics and Gynecology

## 2014-12-31 VITALS — BP 150/68 | HR 96 | Temp 98.3°F | Ht 71.0 in | Wt 146.6 lb

## 2014-12-31 DIAGNOSIS — E785 Hyperlipidemia, unspecified: Secondary | ICD-10-CM

## 2014-12-31 DIAGNOSIS — I10 Essential (primary) hypertension: Secondary | ICD-10-CM

## 2014-12-31 DIAGNOSIS — R040 Epistaxis: Secondary | ICD-10-CM

## 2014-12-31 DIAGNOSIS — J449 Chronic obstructive pulmonary disease, unspecified: Secondary | ICD-10-CM

## 2014-12-31 MED ORDER — SALINE SPRAY 0.65 % NA SOLN: 1.0000 | NASAL | Status: DC | PRN

## 2014-12-31 MED ORDER — TIOTROPIUM BROMIDE MONOHYDRATE 2.5 MCG/ACT IN AERS: INHALATION_SPRAY | RESPIRATORY_TRACT | Status: DC

## 2014-12-31 MED ORDER — HYDROCHLOROTHIAZIDE 25 MG PO TABS: 50.0000 mg | ORAL_TABLET | Freq: Every day | ORAL | Status: DC

## 2014-12-31 NOTE — Progress Notes (Signed)
    Subjective: Chief Complaint  Patient presents with  . Hypertension  . Hyperlipidemia  . Epistaxis    x 3 weeks, randomly occurs anytime of the day but occurs more often in the morning    HPI: Lucas [name] is a 64 y.o. presenting to clinic today to discuss the following:  Nose Bloods: -last three weeks -mostly in morning -no blood clottting -uses handkerchief a lot in mornings; using alot more due to allergies -bleeding last about 10-15 min but easily stoppable with compression -not on blood thinners -denies trauma to area  Hypertension: Blood pressure at home: Not checking Blood pressure today: 168/96 followed by 1150/68 Taking Meds: yes, hctz Side effects: none ROS: Denies headache, + dizziness, visual changes, nausea, vomiting, chest pain, abdominal pain or shortness of breath.  Hyperlipidemia:  -patient states he is taking new cholesterol medication -no side effects  Health Maintenance: Colonoscopy, HIV    All systems were reviewed and were negative unless otherwise noted in the HPI Past Medical, Surgical, Social, and Family History Reviewed & Updated per EMR.   Objective: BP 150/68 mmHg  Pulse 96  Temp(Src) 98.3 F (36.8 C) (Oral)  Ht 5\' 11"  (1.803 m)  Wt 146 lb 9 oz (66.48 kg)  BMI 20.45 kg/m2  Physical Exam  Constitutional: He is oriented to person, place, and time and well-developed, well-nourished, and in no distress.  HENT:  Head: Normocephalic and atraumatic.  Nose: No mucosal edema or rhinorrhea. No epistaxis.  Dry mucosa with mild erythema on septal wall bilaterally. No obvious signs of bleeding. No vessels observed.  Cardiovascular: Normal rate, regular rhythm and normal heart sounds.   Pulmonary/Chest: Effort normal and breath sounds normal.  Abdominal: Soft. Bowel sounds are normal.  Musculoskeletal: Normal range of motion. He exhibits no edema.  Neurological: He is alert and oriented to person, place, and time.  Skin: Skin is warm and  dry.    Assessment/Plan: Please see problem based Assessment and Plan   Meds ordered this encounter  Medications  . Tiotropium Bromide Monohydrate (SPIRIVA RESPIMAT) 2.5 MCG/ACT AERS    Sig: Inhale 2 puffs of inhaler once daily.    Dispense:  1 Inhaler    Refill:  2  . hydrochlorothiazide (HYDRODIURIL) 25 MG tablet    Sig: Take 2 tablets (50 mg total) by mouth daily.    Dispense:  60 tablet    Refill:  3  . sodium chloride (OCEAN) 0.65 % SOLN nasal spray    Sig: Place 1 spray into both nostrils as needed for congestion.    Dispense:  1 Bottle    Refill:  [place], DO 12/31/2014, 9:01 PM PGY-1, [place]

## 2014-12-31 NOTE — Patient Instructions (Addendum)
Lucas [name] it was great to see you today!  I am pleased to hear that things are going well for you.  Here are some of the things we discussed today: -I am giving you a nasal spray to try to keep your nose moist and avoid bleeds -I am increasing your BP medication to help better control your pressures  New medications: Nasal spray  Please schedule a follow-up appointment for 4 months   Thanks for allowing me to be a part of your care! Dr. Gabriel Carina (The Basics) Why do people get nosebleeds? - It can be scary when blood starts coming out your nose or your child's nose. But nosebleeds are not usually serious. They are very common. The most common causes are dry air and nose-picking.  If you or your child gets a nosebleed, the important thing is to know how to deal with it. With the right care, most nosebleeds stop on their own. How do I know if a nosebleed is serious? - You should see a doctor or nurse right away if your nosebleed: Causes blood to gush out of your nose or makes it hard to breathe  Causes you to turn very pale, or makes you tired or confused  Will not stop even after you do the "self-care" steps listed below  Happens right after surgery on your nose, or if you know you have a tumor or other growth in your nose  Happens with other serious symptoms, such as chest pain  Happens after an injury, such as being hit in the face  Will not stop, and you take medicines that prevent blood clots, such as warfarin (brand name: Coumadin), clopidogrel (brand name: Plavix), or daily aspirin If you have chest pain, feel woozy, or if you are bleeding a lot, call 9-1-1. Do not drive yourself to the hospital and do not ask someone else to drive you.  Nosebleed self-care - With the right self-care, most nosebleeds stop on their own. Here's what you should do: 1. Blow your nose. This might increase the bleeding for a moment, but that's OK. 2. Sit or stand while bending forward  a little at the waist. DO NOT lie down or tilt your head back.  3. Pinch the soft area towards the bottom of your nose, below the bone. DO NOT grip the bridge of your nose between your eyes. That will not work. DO NOT press on just 1 side, even if the bleeding is only on 1 side. That will not work either. 4. Squeeze your nose shut for at least 15 minutes. (In children, squeeze for only 5 minutes.) Use a clock to time yourself. Do not release the pressure before the time is up to check if the bleeding has stopped. If you keep checking, you will ruin your chances of getting the bleeding to stop.  If you follow these steps, and your nose keeps bleeding, repeat all the steps once more. Apply pressure for a total of at least 30 minutes (or 10 minutes for children). If you are still bleeding, go to the emergency room or an urgent care clinic.  What if I get repeated nosebleeds? - Frequent nosebleeds can be caused by:  Breathing dry air all the time  Using cold or allergy nasal sprays too much  Frequent colds  Snorting drugs into your nose, such as cocaine In some cases, repeat nosebleeds can be a sign that your blood does not clot like  it should. If that is the case, there are often other clues. For instance, people with clotting problems bruise easily and might bleed more than you would expect after a small cut or scrape. Nosebleed treatment - If you end up seeing a doctor or nurse for your nosebleed, he or she will make sure you can breathe OK. Then he or she will try to get the bleeding to stop. To do that, he or she might have to put a device or some packing material up your nose. What can I do to keep from getting nosebleeds? - You can: Use a humidifier (a machine that makes the air less dry) in your bedroom when you sleep  Keep the inside of your nose moist with a nasal saline spray or gel  Not pick your nose, or at least clip your nails before you do to avoid injury

## 2015-01-01 DIAGNOSIS — R040 Epistaxis: Secondary | ICD-10-CM | POA: Insufficient documentation

## 2015-01-01 NOTE — Assessment & Plan Note (Signed)
Rx for Spiriva Respimat given.

## 2015-01-01 NOTE — Assessment & Plan Note (Signed)
BP elevated. Patient wants to try and increase current medication dose before switching regimen. I would like to change him to CCB or ACEi. If BP or medication dose increase not tolerated will make switch. Will need to get BMP next visit; patient declined blood work today.

## 2015-01-01 NOTE — Assessment & Plan Note (Signed)
Declined labs today will try and obtain at next clinic visit. Patient compliant with medication.

## 2015-01-01 NOTE — Assessment & Plan Note (Signed)
Encouraged patient to continue taking allergy medications to help with post-nasal drip. Rx for nasal saline given to keep membranes moist.

## 2015-06-25 ENCOUNTER — Ambulatory Visit: Payer: Medicare HMO | Admitting: Obstetrics and Gynecology

## 2015-08-12 ENCOUNTER — Ambulatory Visit (INDEPENDENT_AMBULATORY_CARE_PROVIDER_SITE_OTHER): Payer: Medicare HMO | Admitting: Obstetrics and Gynecology

## 2015-08-12 ENCOUNTER — Encounter: Payer: Self-pay | Admitting: Obstetrics and Gynecology

## 2015-08-12 VITALS — BP 144/74 | HR 105 | Temp 98.0°F | Ht 71.0 in | Wt 142.0 lb

## 2015-08-12 DIAGNOSIS — J449 Chronic obstructive pulmonary disease, unspecified: Secondary | ICD-10-CM

## 2015-08-12 DIAGNOSIS — I10 Essential (primary) hypertension: Secondary | ICD-10-CM

## 2015-08-12 MED ORDER — LOSARTAN POTASSIUM-HCTZ 50-12.5 MG PO TABS: 1.0000 | ORAL_TABLET | Freq: Every day | ORAL | Status: DC

## 2015-08-12 MED ORDER — TIOTROPIUM BROMIDE MONOHYDRATE 2.5 MCG/ACT IN AERS: INHALATION_SPRAY | RESPIRATORY_TRACT | Status: DC

## 2015-08-12 MED ORDER — ALBUTEROL SULFATE HFA 108 (90 BASE) MCG/ACT IN AERS: 2.0000 | INHALATION_SPRAY | RESPIRATORY_TRACT | Status: DC | PRN

## 2015-08-12 NOTE — Progress Notes (Signed)
    Subjective: Chief Complaint  Patient presents with  . COPD  . Hypertension     HPI: Lucas [name] is a 64 y.o. presenting to clinic today to discuss the following:  #Hypertension Blood pressure at home: not checking Blood pressure today: elevated Taking Meds: yes HCTZ Side effects: none ROS: Denies headache, + dizziness, visual changes, nausea, vomiting, chest pain, abdominal pain or shortness of breath.  #COPD -well controlled -needs albuterol more when outside and out -takes tiotropium daily -need med refills -denies dyspnea and cough  Smoking 1/2ppd  Drinks - 2 beers a day    ROS in HPI.  Past Medical, Surgical, Social, and Family History Reviewed & Updated per EMR.   Objective: BP 161/94 mmHg  Pulse 105  Temp(Src) 98 F (36.7 C) (Oral)  Ht '5\' 11"'$  (1.803 m)  Wt 142 lb (64.411 kg)  BMI 19.81 kg/m2  Physical Exam  Constitutional: He is well-developed, well-nourished, and in no distress.  Cardiovascular: Normal rate and intact distal pulses.   Pulmonary/Chest: Effort normal and breath sounds normal. He has no wheezes.  Neurological: He is alert.  Skin: Skin is warm and dry.    Assessment/Plan: Please see problem based Assessment and Plan   Meds ordered this encounter  Medications  . albuterol (VENTOLIN HFA) 108 (90 BASE) MCG/ACT inhaler    Sig: Inhale 2 puffs into the lungs every 4 (four) hours as needed for wheezing or shortness of breath.    Dispense:  18 g    Refill:  11    Inhaler with dose counter, if possible. Thanks!  . Tiotropium Bromide Monohydrate (SPIRIVA RESPIMAT) 2.5 MCG/ACT AERS    Sig: Inhale 2 puffs of inhaler once daily.    Dispense:  1 Inhaler    Refill:  2  . losartan-hydrochlorothiazide (HYZAAR) 50-12.5 MG tablet    Sig: Take 1 tablet by mouth daily.    Dispense:  30 tablet    Refill:  Isabela, DO 08/12/2015, 1:36 PM PGY-2, [place]

## 2015-08-12 NOTE — Patient Instructions (Signed)
Mr. Sheller it was great seeing you today.  Here are some of the things we discussed today: -Blood pressure elevated today switched medication to a combined pill that has two blood pressure medications -Refilled all medications -Continue to try and cut back on alcohol and tobacco use for your health.  -I will need to get lab work at the next clinic visit so come prepared.   Please schedule a follow-up appointment for 4 weeks so I can follow-up on BP with new medication  Thanks for allowing me to be a part of your care! Dr. Gerarda Fraction

## 2015-08-20 NOTE — Assessment & Plan Note (Signed)
A: Stable and controlled on current medications.  No wheezes on exam.  P: Continue Albuterol prn and Spiriva. Encouraged smoking cessation. Medications refilled.

## 2015-08-20 NOTE — Assessment & Plan Note (Signed)
BP continues to be elevated. Patient states he is compliant with medications. Will switch patient to losartan-hctz (allergic to ACEi). Patient due for blood work but declined at this visit again. Discussed necessity of blood work with patient and that hopefully next visit he can prepare himself. Monitor blood pressures. Follow-up in 6 weeks.

## 2015-09-16 ENCOUNTER — Emergency Department (HOSPITAL_COMMUNITY): Payer: Medicare HMO | Admitting: Certified Registered"

## 2015-09-16 ENCOUNTER — Inpatient Hospital Stay (HOSPITAL_COMMUNITY)
Admission: EM | Admit: 2015-09-16 | Discharge: 2015-09-25 | DRG: 494 | Disposition: A | Discharge status: Home Health | Payer: Medicare HMO | Attending: Orthopaedic Surgery | Admitting: Orthopaedic Surgery

## 2015-09-16 ENCOUNTER — Encounter (HOSPITAL_COMMUNITY): Payer: Self-pay | Admitting: Certified Registered"

## 2015-09-16 ENCOUNTER — Encounter (HOSPITAL_COMMUNITY)
Admission: EM | Disposition: A | Discharge status: Home Health | Payer: Self-pay | Source: Home / Self Care | Attending: Orthopaedic Surgery

## 2015-09-16 ENCOUNTER — Emergency Department (HOSPITAL_COMMUNITY): Payer: Medicare HMO

## 2015-09-16 DIAGNOSIS — E785 Hyperlipidemia, unspecified: Secondary | ICD-10-CM | POA: Diagnosis present

## 2015-09-16 DIAGNOSIS — F17210 Nicotine dependence, cigarettes, uncomplicated: Secondary | ICD-10-CM | POA: Diagnosis present

## 2015-09-16 DIAGNOSIS — S82201A Unspecified fracture of shaft of right tibia, initial encounter for closed fracture: Secondary | ICD-10-CM

## 2015-09-16 DIAGNOSIS — Y92410 Unspecified street and highway as the place of occurrence of the external cause: Secondary | ICD-10-CM | POA: Diagnosis not present

## 2015-09-16 DIAGNOSIS — R0902 Hypoxemia: Secondary | ICD-10-CM | POA: Diagnosis present

## 2015-09-16 DIAGNOSIS — Z881 Allergy status to other antibiotic agents status: Secondary | ICD-10-CM

## 2015-09-16 DIAGNOSIS — Z880 Allergy status to penicillin: Secondary | ICD-10-CM | POA: Diagnosis not present

## 2015-09-16 DIAGNOSIS — M67462 Ganglion, left knee: Secondary | ICD-10-CM | POA: Diagnosis present

## 2015-09-16 DIAGNOSIS — Z23 Encounter for immunization: Secondary | ICD-10-CM | POA: Diagnosis not present

## 2015-09-16 DIAGNOSIS — Z888 Allergy status to other drugs, medicaments and biological substances status: Secondary | ICD-10-CM

## 2015-09-16 DIAGNOSIS — S82143A Displaced bicondylar fracture of unspecified tibia, initial encounter for closed fracture: Secondary | ICD-10-CM | POA: Diagnosis present

## 2015-09-16 DIAGNOSIS — J449 Chronic obstructive pulmonary disease, unspecified: Secondary | ICD-10-CM | POA: Diagnosis present

## 2015-09-16 DIAGNOSIS — Z885 Allergy status to narcotic agent status: Secondary | ICD-10-CM

## 2015-09-16 DIAGNOSIS — Q899 Congenital malformation, unspecified: Secondary | ICD-10-CM

## 2015-09-16 DIAGNOSIS — T148XXA Other injury of unspecified body region, initial encounter: Secondary | ICD-10-CM

## 2015-09-16 DIAGNOSIS — S82141A Displaced bicondylar fracture of right tibia, initial encounter for closed fracture: Principal | ICD-10-CM | POA: Diagnosis present

## 2015-09-16 DIAGNOSIS — Z419 Encounter for procedure for purposes other than remedying health state, unspecified: Secondary | ICD-10-CM

## 2015-09-16 DIAGNOSIS — I10 Essential (primary) hypertension: Secondary | ICD-10-CM | POA: Diagnosis present

## 2015-09-16 DIAGNOSIS — T1490XA Injury, unspecified, initial encounter: Secondary | ICD-10-CM

## 2015-09-16 HISTORY — PX: EXTERNAL FIXATION LEG: SHX1549

## 2015-09-16 LAB — PROTIME-INR
INR: 1.04 (ref 0.00–1.49)
PROTHROMBIN TIME: 13.8 s (ref 11.6–15.2)

## 2015-09-16 LAB — COMPREHENSIVE METABOLIC PANEL
ALK PHOS: 63 U/L (ref 38–126)
ALT: 22 U/L (ref 17–63)
AST: 34 U/L (ref 15–41)
Albumin: 3.5 g/dL (ref 3.5–5.0)
Anion gap: 11 (ref 5–15)
BILIRUBIN TOTAL: 0.6 mg/dL (ref 0.3–1.2)
BUN: 8 mg/dL (ref 6–20)
CALCIUM: 9.2 mg/dL (ref 8.9–10.3)
CHLORIDE: 104 mmol/L (ref 101–111)
CO2: 23 mmol/L (ref 22–32)
CREATININE: 1.03 mg/dL (ref 0.61–1.24)
Glucose, Bld: 99 mg/dL (ref 65–99)
Potassium: 3.5 mmol/L (ref 3.5–5.1)
Sodium: 138 mmol/L (ref 135–145)
TOTAL PROTEIN: 6.7 g/dL (ref 6.5–8.1)

## 2015-09-16 LAB — PREPARE FRESH FROZEN PLASMA
UNIT DIVISION: 0
Unit division: 0

## 2015-09-16 LAB — CBC
HCT: 35.7 % — ABNORMAL LOW (ref 39.0–52.0)
Hemoglobin: 12.6 g/dL — ABNORMAL LOW (ref 13.0–17.0)
MCH: 33 pg (ref 26.0–34.0)
MCHC: 35.3 g/dL (ref 30.0–36.0)
MCV: 93.5 fL (ref 78.0–100.0)
PLATELETS: 209 10*3/uL (ref 150–400)
RBC: 3.82 MIL/uL — AB (ref 4.22–5.81)
RDW: 12.9 % (ref 11.5–15.5)
WBC: 9.3 10*3/uL (ref 4.0–10.5)

## 2015-09-16 LAB — CDS SEROLOGY

## 2015-09-16 LAB — ETHANOL: ALCOHOL ETHYL (B): 169 mg/dL — AB (ref <5)

## 2015-09-16 LAB — ABO/RH: ABO/RH(D): O POS

## 2015-09-16 SURGERY — EXTERNAL FIXATION, LOWER EXTREMITY
Anesthesia: General | Laterality: Right

## 2015-09-16 MED ORDER — MIDAZOLAM HCL 2 MG/2ML IJ SOLN
INTRAMUSCULAR | Status: AC
  Filled 2015-09-16: qty 2

## 2015-09-16 MED ORDER — FENTANYL CITRATE (PF) 100 MCG/2ML IJ SOLN
INTRAMUSCULAR | Status: DC | PRN
  Administered 2015-09-16 (×2): 50 ug via INTRAVENOUS

## 2015-09-16 MED ORDER — SIMVASTATIN 40 MG PO TABS
40.0000 mg | ORAL_TABLET | Freq: Every day | ORAL | Status: DC
  Administered 2015-09-17 – 2015-09-24 (×7): 40 mg via ORAL
  Filled 2015-09-16 (×7): qty 1

## 2015-09-16 MED ORDER — DEXAMETHASONE SODIUM PHOSPHATE 4 MG/ML IJ SOLN
INTRAMUSCULAR | Status: DC | PRN
  Administered 2015-09-16: 4 mg via INTRAVENOUS

## 2015-09-16 MED ORDER — OXYCODONE HCL 5 MG PO TABS
5.0000 mg | ORAL_TABLET | ORAL | Status: DC | PRN
  Administered 2015-09-17 – 2015-09-25 (×21): 10 mg via ORAL
  Filled 2015-09-16 (×21): qty 2

## 2015-09-16 MED ORDER — LACTATED RINGERS IV SOLN
INTRAVENOUS | Status: DC | PRN
  Administered 2015-09-16: 19:00:00 via INTRAVENOUS

## 2015-09-16 MED ORDER — CEFAZOLIN SODIUM-DEXTROSE 2-3 GM-% IV SOLR
INTRAVENOUS | Status: DC | PRN
  Administered 2015-09-16: 2 g via INTRAVENOUS

## 2015-09-16 MED ORDER — FENTANYL CITRATE (PF) 100 MCG/2ML IJ SOLN
100.0000 ug | Freq: Once | INTRAMUSCULAR | Status: AC
Start: 2015-09-16 — End: 2015-09-16
  Administered 2015-09-16: 100 ug via INTRAVENOUS

## 2015-09-16 MED ORDER — METHOCARBAMOL 1000 MG/10ML IJ SOLN
500.0000 mg | Freq: Four times a day (QID) | INTRAMUSCULAR | Status: DC | PRN
  Administered 2015-09-23: 500 mg via INTRAVENOUS
  Filled 2015-09-16 (×2): qty 5

## 2015-09-16 MED ORDER — LIDOCAINE HCL (CARDIAC) 20 MG/ML IV SOLN
INTRAVENOUS | Status: AC
Start: 2015-09-16 — End: 2015-09-16
  Filled 2015-09-16: qty 5

## 2015-09-16 MED ORDER — SUCCINYLCHOLINE CHLORIDE 20 MG/ML IJ SOLN
INTRAMUSCULAR | Status: DC | PRN
  Administered 2015-09-16: 100 mg via INTRAVENOUS

## 2015-09-16 MED ORDER — LIDOCAINE HCL (CARDIAC) 20 MG/ML IV SOLN
INTRAVENOUS | Status: AC
  Filled 2015-09-16: qty 5

## 2015-09-16 MED ORDER — EPHEDRINE SULFATE 50 MG/ML IJ SOLN
INTRAMUSCULAR | Status: DC | PRN
  Administered 2015-09-16 (×2): 10 mg via INTRAVENOUS

## 2015-09-16 MED ORDER — GLYCOPYRROLATE 0.2 MG/ML IJ SOLN
INTRAMUSCULAR | Status: AC
Start: 2015-09-16 — End: 2015-09-16
  Filled 2015-09-16: qty 1

## 2015-09-16 MED ORDER — FENTANYL CITRATE (PF) 100 MCG/2ML IJ SOLN
INTRAMUSCULAR | Status: AC
  Filled 2015-09-16: qty 2

## 2015-09-16 MED ORDER — FENTANYL CITRATE (PF) 100 MCG/2ML IJ SOLN
50.0000 ug | Freq: Once | INTRAMUSCULAR | Status: AC
Start: 2015-09-16 — End: 2015-09-16
  Administered 2015-09-16: 50 ug via INTRAVENOUS

## 2015-09-16 MED ORDER — SODIUM CHLORIDE 0.9 % IV SOLN
INTRAVENOUS | Status: DC | PRN
  Administered 2015-09-16: 20:00:00 via INTRAVENOUS

## 2015-09-16 MED ORDER — ONDANSETRON HCL 4 MG/2ML IJ SOLN
4.0000 mg | Freq: Four times a day (QID) | INTRAMUSCULAR | Status: DC | PRN
  Administered 2015-09-17: 4 mg via INTRAVENOUS
  Filled 2015-09-16: qty 2

## 2015-09-16 MED ORDER — HYDROCHLOROTHIAZIDE 25 MG PO TABS
50.0000 mg | ORAL_TABLET | Freq: Every day | ORAL | Status: DC
  Administered 2015-09-17: 50 mg via ORAL
  Filled 2015-09-16: qty 2

## 2015-09-16 MED ORDER — HYDROMORPHONE HCL 1 MG/ML IJ SOLN
INTRAMUSCULAR | Status: AC
  Filled 2015-09-16: qty 1

## 2015-09-16 MED ORDER — GLYCOPYRROLATE 0.2 MG/ML IJ SOLN
INTRAMUSCULAR | Status: DC | PRN
Start: 2015-09-16 — End: 2015-09-16
  Administered 2015-09-16: 0.2 mg via INTRAVENOUS

## 2015-09-16 MED ORDER — ROCURONIUM BROMIDE 50 MG/5ML IV SOLN
INTRAVENOUS | Status: AC
  Filled 2015-09-16: qty 1

## 2015-09-16 MED ORDER — EPHEDRINE SULFATE 50 MG/ML IJ SOLN
INTRAMUSCULAR | Status: AC
  Filled 2015-09-16: qty 1

## 2015-09-16 MED ORDER — PROPOFOL 10 MG/ML IV BOLUS
INTRAVENOUS | Status: DC | PRN
  Administered 2015-09-16: 120 mg via INTRAVENOUS

## 2015-09-16 MED ORDER — HYDROMORPHONE HCL 1 MG/ML IJ SOLN
0.2500 mg | INTRAMUSCULAR | Status: DC | PRN
  Administered 2015-09-16 (×4): 0.5 mg via INTRAVENOUS

## 2015-09-16 MED ORDER — DIPHENHYDRAMINE HCL 12.5 MG/5ML PO ELIX: 12.5000 mg | ORAL_SOLUTION | ORAL | Status: DC | PRN

## 2015-09-16 MED ORDER — PROPOFOL 10 MG/ML IV BOLUS
INTRAVENOUS | Status: AC
  Filled 2015-09-16: qty 20

## 2015-09-16 MED ORDER — METOCLOPRAMIDE HCL 5 MG PO TABS: 5.0000 mg | ORAL_TABLET | Freq: Three times a day (TID) | ORAL | Status: DC | PRN

## 2015-09-16 MED ORDER — DEXAMETHASONE SODIUM PHOSPHATE 4 MG/ML IJ SOLN
INTRAMUSCULAR | Status: AC
  Filled 2015-09-16: qty 1

## 2015-09-16 MED ORDER — ONDANSETRON HCL 4 MG PO TABS: 4.0000 mg | ORAL_TABLET | Freq: Four times a day (QID) | ORAL | Status: DC | PRN

## 2015-09-16 MED ORDER — FENTANYL CITRATE (PF) 250 MCG/5ML IJ SOLN
INTRAMUSCULAR | Status: AC
  Filled 2015-09-16: qty 5

## 2015-09-16 MED ORDER — 0.9 % SODIUM CHLORIDE (POUR BTL) OPTIME
TOPICAL | Status: DC | PRN
  Administered 2015-09-16: 1000 mL

## 2015-09-16 MED ORDER — MIDAZOLAM HCL 5 MG/5ML IJ SOLN
INTRAMUSCULAR | Status: DC | PRN
  Administered 2015-09-16: 2 mg via INTRAVENOUS

## 2015-09-16 MED ORDER — IOHEXOL 300 MG/ML  SOLN
100.0000 mL | Freq: Once | INTRAMUSCULAR | Status: AC | PRN
  Administered 2015-09-16: 100 mL via INTRAVENOUS

## 2015-09-16 MED ORDER — CLINDAMYCIN PHOSPHATE 600 MG/50ML IV SOLN
600.0000 mg | Freq: Four times a day (QID) | INTRAVENOUS | Status: AC
  Administered 2015-09-17 (×3): 600 mg via INTRAVENOUS
  Filled 2015-09-16 (×3): qty 50

## 2015-09-16 MED ORDER — ALBUTEROL SULFATE (2.5 MG/3ML) 0.083% IN NEBU: 3.0000 mL | INHALATION_SOLUTION | RESPIRATORY_TRACT | Status: DC | PRN

## 2015-09-16 MED ORDER — ACETAMINOPHEN 325 MG PO TABS
650.0000 mg | ORAL_TABLET | Freq: Four times a day (QID) | ORAL | Status: DC | PRN
  Administered 2015-09-24: 650 mg via ORAL
  Filled 2015-09-16: qty 2

## 2015-09-16 MED ORDER — METHOCARBAMOL 500 MG PO TABS
500.0000 mg | ORAL_TABLET | Freq: Four times a day (QID) | ORAL | Status: DC | PRN
  Administered 2015-09-17 – 2015-09-24 (×13): 500 mg via ORAL
  Filled 2015-09-16 (×15): qty 1

## 2015-09-16 MED ORDER — ONDANSETRON HCL 4 MG/2ML IJ SOLN
INTRAMUSCULAR | Status: AC
  Filled 2015-09-16: qty 2

## 2015-09-16 MED ORDER — LIDOCAINE HCL (CARDIAC) 20 MG/ML IV SOLN
INTRAVENOUS | Status: DC | PRN
  Administered 2015-09-16: 100 mg via INTRAVENOUS

## 2015-09-16 MED ORDER — FENTANYL CITRATE (PF) 100 MCG/2ML IJ SOLN
INTRAMUSCULAR | Status: AC
Start: 2015-09-16 — End: 2015-09-17
  Filled 2015-09-16: qty 2

## 2015-09-16 MED ORDER — HYDROMORPHONE HCL 1 MG/ML IJ SOLN
1.0000 mg | INTRAMUSCULAR | Status: DC | PRN
  Administered 2015-09-17 – 2015-09-23 (×4): 1 mg via INTRAVENOUS
  Filled 2015-09-16 (×4): qty 1

## 2015-09-16 MED ORDER — SUCCINYLCHOLINE CHLORIDE 20 MG/ML IJ SOLN
INTRAMUSCULAR | Status: AC
  Filled 2015-09-16: qty 1

## 2015-09-16 MED ORDER — ACETAMINOPHEN 650 MG RE SUPP: 650.0000 mg | Freq: Four times a day (QID) | RECTAL | Status: DC | PRN

## 2015-09-16 MED ORDER — METOCLOPRAMIDE HCL 5 MG/ML IJ SOLN: 5.0000 mg | Freq: Three times a day (TID) | INTRAMUSCULAR | Status: DC | PRN

## 2015-09-16 MED ORDER — SODIUM CHLORIDE 0.9 % IV SOLN
INTRAVENOUS | Status: DC
  Administered 2015-09-17 (×2): via INTRAVENOUS

## 2015-09-16 SURGICAL SUPPLY — 39 items
5MM HALF PIN 200X45 ×2 IMPLANT
BAR EXFX 500X11 NS LF (MISCELLANEOUS) ×2
BAR GLASS FIBER EXFX 11X500 (MISCELLANEOUS) ×2 IMPLANT
BNDG GAUZE ELAST 4 BULKY (GAUZE/BANDAGES/DRESSINGS) ×2 IMPLANT
COVER SURGICAL LIGHT HANDLE (MISCELLANEOUS) ×2 IMPLANT
DRAPE C-ARM 42X72 X-RAY (DRAPES) ×2 IMPLANT
DRAPE ORTHO SPLIT 77X108 STRL (DRAPES) ×4
DRAPE SURG ORHT 6 SPLT 77X108 (DRAPES) ×2 IMPLANT
DRAPE U-SHAPE 47X51 STRL (DRAPES) ×2 IMPLANT
DURAPREP 26ML APPLICATOR (WOUND CARE) ×2 IMPLANT
ELECT REM PT RETURN 9FT ADLT (ELECTROSURGICAL)
ELECTRODE REM PT RTRN 9FT ADLT (ELECTROSURGICAL) IMPLANT
GAUZE SPONGE 4X4 12PLY STRL (GAUZE/BANDAGES/DRESSINGS) ×2 IMPLANT
GAUZE XEROFORM 5X9 LF (GAUZE/BANDAGES/DRESSINGS) ×2 IMPLANT
GLOVE BIO SURGEON STRL SZ8 (GLOVE) ×2 IMPLANT
GLOVE ORTHO TXT STRL SZ7.5 (GLOVE) ×2 IMPLANT
GOWN STRL REUS W/ TWL LRG LVL3 (GOWN DISPOSABLE) ×1 IMPLANT
GOWN STRL REUS W/ TWL XL LVL3 (GOWN DISPOSABLE) ×2 IMPLANT
GOWN STRL REUS W/TWL LRG LVL3 (GOWN DISPOSABLE) ×2
GOWN STRL REUS W/TWL XL LVL3 (GOWN DISPOSABLE) ×4
KIT BASIN OR (CUSTOM PROCEDURE TRAY) ×2 IMPLANT
KIT ROOM TURNOVER OR (KITS) ×2 IMPLANT
MANIFOLD NEPTUNE II (INSTRUMENTS) IMPLANT
NS IRRIG 1000ML POUR BTL (IV SOLUTION) ×2 IMPLANT
PACK ORTHO EXTREMITY (CUSTOM PROCEDURE TRAY) ×2 IMPLANT
PAD ARMBOARD 7.5X6 YLW CONV (MISCELLANEOUS) ×4 IMPLANT
PADDING CAST COTTON 6X4 STRL (CAST SUPPLIES) ×2 IMPLANT
PIN CLAMP 2BAR 75MM BLUE (PIN) ×2 IMPLANT
PIN CLAMP 45MM 2 BAR (PIN) ×1 IMPLANT
PIN HALF ORANGE 5X200X45MM (Pin) ×2 IMPLANT
PIN HALF YELLOW 5X160X35 (PIN) ×2 IMPLANT
SPONGE LAP 18X18 X RAY DECT (DISPOSABLE) ×2 IMPLANT
SUT ETHILON 2 0 FS 18 (SUTURE) IMPLANT
SUT VIC AB 2-0 CT1 27 (SUTURE)
SUT VIC AB 2-0 CT1 TAPERPNT 27 (SUTURE) IMPLANT
TOWEL OR 17X24 6PK STRL BLUE (TOWEL DISPOSABLE) ×2 IMPLANT
TOWEL OR 17X26 10 PK STRL BLUE (TOWEL DISPOSABLE) ×2 IMPLANT
UNDERPAD 30X30 INCONTINENT (UNDERPADS AND DIAPERS) ×2 IMPLANT
WATER STERILE IRR 1000ML POUR (IV SOLUTION) ×1 IMPLANT
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
## 2015-09-16 NOTE — Brief Op Note (Signed)
09/16/2015  8:44 PM  PATIENT:  [name]  64 y.o. male  PRE-OPERATIVE DIAGNOSIS:  Right Tibial Plateau Fracture  POST-OPERATIVE DIAGNOSIS:  Right Tibial Plateau Fracture  PROCEDURE:  Procedure(s): EXTERNAL FIXATION LEG/LARGE (Right)  SURGEON:  Surgeon(s) and Role:    * Mcarthur Rossetti, MD - Primary  PHYSICIAN ASSISTANT: Benita Stabile, PA-C  ANESTHESIA:   general  EBL:  Total I/O In: 1000 [I.V.:1000] Out: -   BLOOD ADMINISTERED:none  DRAINS: none   LOCAL MEDICATIONS USED:  NONE  SPECIMEN:  No Specimen  DISPOSITION OF SPECIMEN:  N/A  COUNTS:  YES  TOURNIQUET:  * No tourniquets in log *  DICTATION: .Other Dictation: Dictation Number [id number]  PLAN OF CARE: Admit to inpatient   PATIENT DISPOSITION:  PACU - hemodynamically stable.   Delay start of Pharmacological VTE agent (>24hrs) due to surgical blood loss or risk of bleeding: no

## 2015-09-16 NOTE — Progress Notes (Signed)
Chaplain was told that pt's wife had arrived and was in consult B. Pt was in  CT but I brought a nurse to consult B to update pt's wife. When pt returned from [place], I accompanied pt's wife to Trauma B to bedside.
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
## 2015-09-16 NOTE — ED Notes (Signed)
Ortho PA at bedside with to assess patient.

## 2015-09-16 NOTE — Anesthesia Procedure Notes (Signed)
Procedure Name: Intubation Date/Time: 09/16/2015 7:59 PM Performed by: Claris Che Pre-anesthesia Checklist: Patient identified, Emergency Drugs available, Suction available, Patient being monitored and Timeout performed Patient Re-evaluated:Patient Re-evaluated prior to inductionOxygen Delivery Method: Circle system utilized Preoxygenation: Pre-oxygenation with 100% oxygen Intubation Type: IV induction, Rapid sequence and Cricoid Pressure applied Ventilation: Mask ventilation without difficulty Laryngoscope Size: Mac and 3 Grade View: Grade I Tube type: Oral Tube size: 8.0 mm Number of attempts: 1 Airway Equipment and Method: Stylet Placement Confirmation: ETT inserted through vocal cords under direct vision,  positive ETCO2 and breath sounds checked- equal and bilateral Secured at: 24 cm Tube secured with: Tape Dental Injury: Teeth and Oropharynx as per pre-operative assessment

## 2015-09-16 NOTE — Transfer of Care (Signed)
Immediate Anesthesia Transfer of Care Note  Patient: Lucas [name]  Procedure(s) Performed: Procedure(s): EXTERNAL FIXATION LEG/LARGE (Right)  Patient Location: PACU  Anesthesia Type:General  Level of Consciousness: oriented, sedated, patient cooperative, pateint uncooperative and responds to stimulation  Airway & Oxygen Therapy: Patient Spontanous Breathing and Patient connected to nasal cannula oxygen  Post-op Assessment: Report given to RN, Post -op Vital signs reviewed and stable and Patient moving all extremities X 4  Post vital signs: Reviewed and stable  Last Vitals:  Filed Vitals:   09/16/15 1745 09/16/15 2054  BP: 138/77   Pulse: 92   Temp:  36.7 C  Resp: 17     Complications: No apparent anesthesia complications

## 2015-09-16 NOTE — Anesthesia Preprocedure Evaluation (Signed)
Anesthesia Evaluation  Patient identified by MRN, date of birth, ID band Patient awake    Reviewed: Allergy & Precautions, H&P , NPO status , Patient's Chart, lab work & pertinent test results  Airway Mallampati: III  TM Distance: >3 FB Neck ROM: Limited    Dental no notable dental hx. (+) Edentulous Upper, Partial Lower   Pulmonary COPD,  COPD inhaler, Current Smoker,    Pulmonary exam normal breath sounds clear to auscultation       Cardiovascular hypertension, Pt. on medications  Rhythm:Regular Rate:Normal     Neuro/Psych negative neurological ROS  negative psych ROS   GI/Hepatic negative GI ROS, Neg liver ROS,   Endo/Other  negative endocrine ROS  Renal/GU negative Renal ROS  negative genitourinary   Musculoskeletal   Abdominal   Peds  Hematology negative hematology ROS (+)   Anesthesia Other Findings   Reproductive/Obstetrics negative OB ROS                             Anesthesia Physical Anesthesia Plan  ASA: II  Anesthesia Plan: General   Post-op Pain Management:    Induction: Intravenous, Rapid sequence and Cricoid pressure planned  Airway Management Planned: Oral ETT  Additional Equipment:   Intra-op Plan:   Post-operative Plan: Extubation in OR  Informed Consent: I have reviewed the patients History and Physical, chart, labs and discussed the procedure including the risks, benefits and alternatives for the proposed anesthesia with the patient or authorized representative who has indicated his/her understanding and acceptance.   Dental advisory given  Plan Discussed with: CRNA and Surgeon  Anesthesia Plan Comments:         Anesthesia Quick Evaluation

## 2015-09-16 NOTE — ED Notes (Signed)
OR calling ready for patient.
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
## 2015-09-16 NOTE — Anesthesia Postprocedure Evaluation (Signed)
Anesthesia Post Note  Patient: Lucas [name]  Procedure(s) Performed: Procedure(s) (LRB): EXTERNAL FIXATION LEG/LARGE (Right)  Patient location during evaluation: PACU Anesthesia Type: General Level of consciousness: awake and alert Pain management: pain level controlled Vital Signs Assessment: post-procedure vital signs reviewed and stable Respiratory status: spontaneous breathing, nonlabored ventilation, respiratory function stable and patient connected to nasal cannula oxygen Cardiovascular status: blood pressure returned to baseline and stable Postop Assessment: No signs of nausea or vomiting Anesthetic complications: no    Last Vitals:  Filed Vitals:   09/16/15 2153 09/16/15 2154  BP:  142/82  Pulse:  94  Temp: 36.7 C   Resp:  17    Last Pain:  Filed Vitals:   09/16/15 2155  PainSc: 6                  Klynn Linnemann,W. EDMOND

## 2015-09-17 ENCOUNTER — Inpatient Hospital Stay (HOSPITAL_COMMUNITY): Payer: Medicare HMO

## 2015-09-17 DIAGNOSIS — S82143A Displaced bicondylar fracture of unspecified tibia, initial encounter for closed fracture: Secondary | ICD-10-CM | POA: Diagnosis present

## 2015-09-17 DIAGNOSIS — S82141A Displaced bicondylar fracture of right tibia, initial encounter for closed fracture: Secondary | ICD-10-CM

## 2015-09-17 LAB — TYPE AND SCREEN
ABO/RH(D): O POS
ANTIBODY SCREEN: NEGATIVE
UNIT DIVISION: 0
Unit division: 0

## 2015-09-17 LAB — BLOOD PRODUCT ORDER (VERBAL) VERIFICATION

## 2015-09-17 MED ORDER — HYDROCHLOROTHIAZIDE 25 MG PO TABS
12.5000 mg | ORAL_TABLET | Freq: Every day | ORAL | Status: DC
  Administered 2015-09-18 – 2015-09-25 (×7): 12.5 mg via ORAL
  Filled 2015-09-17 (×7): qty 1

## 2015-09-17 MED ORDER — LOSARTAN POTASSIUM 50 MG PO TABS
50.0000 mg | ORAL_TABLET | Freq: Every day | ORAL | Status: DC
  Administered 2015-09-17 – 2015-09-25 (×8): 50 mg via ORAL
  Filled 2015-09-17 (×8): qty 1

## 2015-09-17 MED ORDER — INFLUENZA VAC SPLIT QUAD 0.5 ML IM SUSY
0.5000 mL | PREFILLED_SYRINGE | INTRAMUSCULAR | Status: AC
  Administered 2015-09-18: 0.5 mL via INTRAMUSCULAR
  Filled 2015-09-17: qty 0.5

## 2015-09-17 MED ORDER — ENOXAPARIN SODIUM 30 MG/0.3ML ~~LOC~~ SOLN
30.0000 mg | Freq: Two times a day (BID) | SUBCUTANEOUS | Status: DC
  Administered 2015-09-17 – 2015-09-21 (×8): 30 mg via SUBCUTANEOUS
  Filled 2015-09-17 (×9): qty 0.3

## 2015-09-17 NOTE — Progress Notes (Signed)
Patient ID: Lucas [name], male   DOB: Jul 27, 1951, 64 y.o.   MRN: [id number] Mr. Lucas [name] is awake and alert.  He denies numbness in his right foot.  I can palpate a pulse in his foot and he actually can dorsiflex his foot/ankle without difficulty and not much discomfort.  His right proximal tibial is quite swollen, but not tight.  The ex-fix is intact.  My plan will be to obtain a CT scan of his right knee today to further assess his fracture.  Will start Lovenox for DVT prevention.  Ice is needed as well as elevation to decrease his soft-tissue swelling.  Will likely keep him here and plan for definitive fixation next week as his soft-tissue swelling subsides.  Can be placed on my service once Trauma further evaluates him and feels comfortable with transferring him to Ortho.  He understands the plan.  Will be non-weight bearing on his right leg for up to 3 months.
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
## 2015-09-18 DIAGNOSIS — S82141A Displaced bicondylar fracture of right tibia, initial encounter for closed fracture: Secondary | ICD-10-CM | POA: Diagnosis not present

## 2015-09-18 NOTE — Progress Notes (Signed)
Patient ID: Lucas [name], male   DOB: 1951/05/22, 64 y.o.   MRN: [id number] No acute changes.  Right leg with intact ex-fix.  Able to still dorsiflex his ankle and foot easily.  Reports normal sensation in his right foot.  Compartments getting softer.  Anticipate surgery on Tuesday for definitive ORIF.
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
## 2015-09-18 NOTE — Progress Notes (Signed)
Utilization review completed.  

## 2015-09-19 MED ORDER — SENNOSIDES-DOCUSATE SODIUM 8.6-50 MG PO TABS
1.0000 | ORAL_TABLET | Freq: Once | ORAL | Status: AC
  Administered 2015-09-19: 1 via ORAL
  Filled 2015-09-19: qty 1

## 2015-09-19 MED ORDER — DOCUSATE SODIUM 100 MG PO CAPS
100.0000 mg | ORAL_CAPSULE | Freq: Two times a day (BID) | ORAL | Status: DC
  Administered 2015-09-19 – 2015-09-25 (×10): 100 mg via ORAL
  Filled 2015-09-19 (×11): qty 1

## 2015-09-19 NOTE — Progress Notes (Signed)
Subjective: 3 Days Post-Op Procedure(s) (LRB): EXTERNAL FIXATION LEG/LARGE (Right) Patient reports pain as mild and moderate.    Objective: Vital signs in last 24 hours: Temp:  [97.1 F (36.2 C)-98.8 F (37.1 C)] 98.8 F (37.1 C) (11/26 0419) Pulse Rate:  [85-89] 86 (11/26 0419) Resp:  [18] 18 (11/26 0419) BP: (140-160)/(66-81) 149/69 mmHg (11/26 0419) SpO2:  [98 %-100 %] 99 % (11/26 0419)  Intake/Output from previous day: 11/25 0701 - 11/26 0700 In: 540 [P.O.:540] Out: 1500 [Urine:1500] Intake/Output this shift:     Recent Labs  09/16/15 1915  HGB 12.6*    Recent Labs  09/16/15 1915  WBC 9.3  RBC 3.82*  HCT 35.7*  PLT 209    Recent Labs  09/16/15 1650  NA 138  K 3.5  CL 104  CO2 23  BUN 8  CREATININE 1.03  GLUCOSE 99  CALCIUM 9.2    Recent Labs  09/16/15 1650  INR 1.04    Neurologically intact Compartment soft  Assessment/Plan: 3 Days Post-Op Procedure(s) (LRB): EXTERNAL FIXATION LEG/LARGE (Right) Up with therapy  . Surgery on Tuesday.   YATES,MARK C 09/19/2015, 7:40 AM

## 2015-09-19 NOTE — Progress Notes (Signed)
PT Cancellation Note  Patient Details Name: ROBIN PAFFORD MRN: [id number] DOB: 1950-12-16   Cancelled Treatment:    Reason Eval/Treat Not Completed: Patient declined, no reason specified (pt refused stating he currrently has no pain in his leg and his stomach hurts. Pt educated for need to mobilize to assist with GI tract movement and he continued to decline)   Melford Aase 09/19/2015, 11:32 AM Elwyn Reach, [place]

## 2015-09-20 MED ORDER — BISACODYL 10 MG RE SUPP
10.0000 mg | Freq: Every day | RECTAL | Status: DC | PRN
  Administered 2015-09-21 – 2015-09-23 (×2): 10 mg via RECTAL
  Filled 2015-09-20 (×2): qty 1

## 2015-09-20 NOTE — Progress Notes (Signed)
Subjective: 4 Days Post-Op Procedure(s) (LRB): EXTERNAL FIXATION LEG/LARGE (Right) Patient reports pain as moderate.    Objective: Vital signs in last 24 hours: Temp:  [97.7 F (36.5 C)-98.8 F (37.1 C)] 98.8 F (37.1 C) (11/27 0533) Pulse Rate:  [82-93] 82 (11/27 0533) Resp:  [18] 18 (11/27 0533) BP: (131-139)/(68-75) 131/68 mmHg (11/27 0533) SpO2:  [97 %-98 %] 97 % (11/27 0533)  Intake/Output from previous day: 11/26 0701 - 11/27 0700 In: 960 [P.O.:960] Out: 2030 [Urine:2030] Intake/Output this shift:    No results for input(s): HGB in the last 72 hours. No results for input(s): WBC, RBC, HCT, PLT in the last 72 hours. No results for input(s): NA, K, CL, CO2, BUN, CREATININE, GLUCOSE, CALCIUM in the last 72 hours. No results for input(s): LABPT, INR in the last 72 hours.  ext fix intact  Assessment/Plan: 4 Days Post-Op Procedure(s) (LRB): EXTERNAL FIXATION LEG/LARGE (Right) Surgery this week.   YATES,MARK C 09/20/2015, 12:12 PM

## 2015-09-21 ENCOUNTER — Encounter (HOSPITAL_COMMUNITY): Payer: Self-pay | Admitting: Orthopaedic Surgery

## 2015-09-21 LAB — SURGICAL PCR SCREEN
MRSA, PCR: NEGATIVE
STAPHYLOCOCCUS AUREUS: NEGATIVE

## 2015-09-21 NOTE — Care Management Important Message (Signed)
Important Message  Patient Details  Name: PHILLP DOLORES MRN: [id number] Date of Birth: [birth year]   Medicare Important Message Given:  Yes    Barb Merino Redfield 09/21/2015, 2:35 PM

## 2015-09-21 NOTE — Progress Notes (Signed)
Patient ID: Lucas [name], male   DOB: 01-14-1951, 64 y.o.   MRN: [id number] Soft-tissue swelling of the right knee and proximal tibia continues to subside.  I anticipate being able to take him to the OR late tomorrow for removal of his right LE ex-fix and definitive ORIF of the right proximal tibia fracture.

## 2015-09-21 NOTE — Progress Notes (Signed)
Physical Therapy Treatment Patient Details Name: Lucas [name] MRN: [id number] DOB: 1951/03/16 Today's Date: 09/21/2015    History of Present Illness      PT Comments    Pt progressing toward mobility goals as evident by ability to ambulate 44f (439fand 2075f Overall mobility level modified independent/min guard. Continue to progress as tolerated with anticipated d/c home with home health PT.   Follow Up Recommendations  Home health PT;Supervision/Assistance - 24 hour     Equipment Recommendations  None recommended by PT    Recommendations for Other Services       Precautions / Restrictions Precautions Precautions: Fall Restrictions Weight Bearing Restrictions: Yes RLE Weight Bearing: Non weight bearing    Mobility  Bed Mobility Overal bed mobility: Modified Independent Bed Mobility: Supine to Sit;Sit to Supine     Supine to sit: HOB elevated Sit to supine: Modified independent (Device/Increase time)   General bed mobility comments: pt uses bilateral UE to elevate R LE EOB and onto bed  Transfers Overall transfer level: Needs assistance Equipment used: Rolling walker (2 wheeled) Transfers: Sit to/from Stand Sit to Stand: Min guard         General transfer comment: cues for hand placement and maintianing NWB R LE status upon standing  Ambulation/Gait Ambulation/Gait assistance: Min guard Ambulation Distance (Feet): 60 Feet ([id number] 20ft21fh seated rest break) Assistive device: Rolling walker (2 wheeled) Gait Pattern/deviations: Step-to pattern     General Gait Details: cues for safer advancement of RW and maintaining NWB status   Stairs            Wheelchair Mobility    Modified Rankin (Stroke Patients Only)       Balance Overall balance assessment: Needs assistance Sitting-balance support: No upper extremity supported Sitting balance-Leahy Scale: Good     Standing balance support: Bilateral upper extremity supported Standing  balance-Leahy Scale: Poor                      Cognition Arousal/Alertness: Awake/alert Behavior During Therapy: WFL for tasks assessed/performed Overall Cognitive Status: Within Functional Limits for tasks assessed                      Exercises General Exercises - Lower Extremity Hip ABduction/ADduction: AROM;Left;20 reps;Supine Straight Leg Raises: AROM;Left;15 reps    General Comments        Pertinent Vitals/Pain Pain Assessment: 0-10 Pain Score: 2  Pain Location: R LE Pain Descriptors / Indicators: Aching Pain Intervention(s): Limited activity within patient's tolerance;Monitored during session;Ice applied    Home Living                      Prior Function            PT Goals (current goals can now be found in the care plan section) Acute Rehab PT Goals PT Goal Formulation: With patient Time For Goal Achievement: 10/01/15 Potential to Achieve Goals: Good Progress towards PT goals: Progressing toward goals    Frequency  Min 5X/week    PT Plan Current plan remains appropriate    Co-evaluation             End of Session Equipment Utilized During Treatment: Gait belt Activity Tolerance: Patient tolerated treatment well Patient left: in bed;with call bell/phone within reach     Time: 0852-0917 PT Time Calculation (min) (ACUTE ONLY): 25 min  Charges:  $Gait Training: 23-37 mins  G CodesDarliss Cheney, PTA [phone] 09/21/2015, 10:13 AM

## 2015-09-22 ENCOUNTER — Inpatient Hospital Stay (HOSPITAL_COMMUNITY): Payer: Medicare HMO | Admitting: Anesthesiology

## 2015-09-22 ENCOUNTER — Encounter (HOSPITAL_COMMUNITY)
Admission: EM | Disposition: A | Discharge status: Home Health | Payer: Self-pay | Source: Home / Self Care | Attending: Orthopaedic Surgery

## 2015-09-22 ENCOUNTER — Inpatient Hospital Stay (HOSPITAL_COMMUNITY): Payer: Medicare HMO

## 2015-09-22 HISTORY — PX: MASS EXCISION: SHX2000

## 2015-09-22 HISTORY — PX: ORIF TIBIA PLATEAU: SHX2132

## 2015-09-22 HISTORY — PX: EXTERNAL FIXATION REMOVAL: SHX5040

## 2015-09-22 SURGERY — OPEN REDUCTION INTERNAL FIXATION (ORIF) TIBIAL PLATEAU
Anesthesia: General | Site: Leg Lower | Laterality: Right

## 2015-09-22 MED ORDER — GLYCOPYRROLATE 0.2 MG/ML IJ SOLN
INTRAMUSCULAR | Status: AC
  Filled 2015-09-22: qty 1

## 2015-09-22 MED ORDER — EPHEDRINE SULFATE 50 MG/ML IJ SOLN
INTRAMUSCULAR | Status: AC
  Filled 2015-09-22: qty 1

## 2015-09-22 MED ORDER — DEXTROSE 5 % IV SOLN
10.0000 mg | INTRAVENOUS | Status: DC | PRN
  Administered 2015-09-22: 20 ug/min via INTRAVENOUS

## 2015-09-22 MED ORDER — ROCURONIUM BROMIDE 50 MG/5ML IV SOLN
INTRAVENOUS | Status: AC
  Filled 2015-09-22: qty 1

## 2015-09-22 MED ORDER — LACTATED RINGERS IV SOLN: INTRAVENOUS | Status: DC

## 2015-09-22 MED ORDER — FENTANYL CITRATE (PF) 100 MCG/2ML IJ SOLN
INTRAMUSCULAR | Status: DC | PRN
  Administered 2015-09-22: 150 ug via INTRAVENOUS
  Administered 2015-09-22 (×3): 50 ug via INTRAVENOUS

## 2015-09-22 MED ORDER — DEXAMETHASONE SODIUM PHOSPHATE 4 MG/ML IJ SOLN
INTRAMUSCULAR | Status: AC
  Filled 2015-09-22: qty 1

## 2015-09-22 MED ORDER — ONDANSETRON HCL 4 MG/2ML IJ SOLN: 4.0000 mg | Freq: Once | INTRAMUSCULAR | Status: DC | PRN

## 2015-09-22 MED ORDER — CEFAZOLIN SODIUM-DEXTROSE 2-3 GM-% IV SOLR
INTRAVENOUS | Status: DC | PRN
  Administered 2015-09-22: 2 g via INTRAVENOUS

## 2015-09-22 MED ORDER — FENTANYL CITRATE (PF) 250 MCG/5ML IJ SOLN
INTRAMUSCULAR | Status: AC
  Filled 2015-09-22: qty 5

## 2015-09-22 MED ORDER — PHENYLEPHRINE 40 MCG/ML (10ML) SYRINGE FOR IV PUSH (FOR BLOOD PRESSURE SUPPORT)
PREFILLED_SYRINGE | INTRAVENOUS | Status: AC
  Filled 2015-09-22: qty 10

## 2015-09-22 MED ORDER — LACTATED RINGERS IV SOLN
INTRAVENOUS | Status: DC
  Administered 2015-09-22: 17:00:00 via INTRAVENOUS

## 2015-09-22 MED ORDER — LABETALOL HCL 5 MG/ML IV SOLN
INTRAVENOUS | Status: DC | PRN
  Administered 2015-09-22: 5 mg via INTRAVENOUS

## 2015-09-22 MED ORDER — LIDOCAINE HCL (CARDIAC) 20 MG/ML IV SOLN
INTRAVENOUS | Status: DC | PRN
  Administered 2015-09-22: 40 mg via INTRAVENOUS

## 2015-09-22 MED ORDER — MEPERIDINE HCL 25 MG/ML IJ SOLN: 6.2500 mg | INTRAMUSCULAR | Status: DC | PRN

## 2015-09-22 MED ORDER — MIDAZOLAM HCL 2 MG/2ML IJ SOLN
INTRAMUSCULAR | Status: AC
  Filled 2015-09-22: qty 2

## 2015-09-22 MED ORDER — ROCURONIUM BROMIDE 100 MG/10ML IV SOLN
INTRAVENOUS | Status: DC | PRN
  Administered 2015-09-22: 10 mg via INTRAVENOUS
  Administered 2015-09-22: 40 mg via INTRAVENOUS
  Administered 2015-09-22: 20 mg via INTRAVENOUS

## 2015-09-22 MED ORDER — PHENYLEPHRINE HCL 10 MG/ML IJ SOLN
INTRAMUSCULAR | Status: DC | PRN
  Administered 2015-09-22: 120 ug via INTRAVENOUS
  Administered 2015-09-22: 40 ug via INTRAVENOUS
  Administered 2015-09-22: 240 ug via INTRAVENOUS

## 2015-09-22 MED ORDER — ONDANSETRON HCL 4 MG/2ML IJ SOLN
INTRAMUSCULAR | Status: DC | PRN
  Administered 2015-09-22: 4 mg via INTRAVENOUS

## 2015-09-22 MED ORDER — FENTANYL CITRATE (PF) 100 MCG/2ML IJ SOLN
25.0000 ug | INTRAMUSCULAR | Status: DC | PRN
  Administered 2015-09-22: 50 ug via INTRAVENOUS

## 2015-09-22 MED ORDER — PROPOFOL 10 MG/ML IV BOLUS
INTRAVENOUS | Status: DC | PRN
  Administered 2015-09-22: 180 mg via INTRAVENOUS

## 2015-09-22 MED ORDER — PROMETHAZINE HCL 25 MG/ML IJ SOLN: 6.2500 mg | INTRAMUSCULAR | Status: DC | PRN

## 2015-09-22 MED ORDER — SODIUM CHLORIDE 0.9 % IV SOLN: INTRAVENOUS | Status: DC

## 2015-09-22 MED ORDER — NEOSTIGMINE METHYLSULFATE 10 MG/10ML IV SOLN
INTRAVENOUS | Status: DC | PRN
  Administered 2015-09-22: 4 mg via INTRAVENOUS

## 2015-09-22 MED ORDER — LACTATED RINGERS IV SOLN
INTRAVENOUS | Status: DC | PRN
  Administered 2015-09-22 (×2): via INTRAVENOUS

## 2015-09-22 MED ORDER — GLYCOPYRROLATE 0.2 MG/ML IJ SOLN
INTRAMUSCULAR | Status: DC | PRN
  Administered 2015-09-22: .6 mg via INTRAVENOUS

## 2015-09-22 MED ORDER — SUCCINYLCHOLINE CHLORIDE 20 MG/ML IJ SOLN
INTRAMUSCULAR | Status: AC
  Filled 2015-09-22: qty 1

## 2015-09-22 MED ORDER — FENTANYL CITRATE (PF) 100 MCG/2ML IJ SOLN
INTRAMUSCULAR | Status: AC
  Administered 2015-09-22: 50 ug via INTRAVENOUS
  Filled 2015-09-22: qty 2

## 2015-09-22 MED ORDER — EPHEDRINE SULFATE 50 MG/ML IJ SOLN
INTRAMUSCULAR | Status: DC | PRN
  Administered 2015-09-22 (×3): 10 mg via INTRAVENOUS

## 2015-09-22 MED ORDER — ONDANSETRON HCL 4 MG/2ML IJ SOLN
INTRAMUSCULAR | Status: AC
  Filled 2015-09-22: qty 2

## 2015-09-22 MED ORDER — FENTANYL CITRATE (PF) 250 MCG/5ML IJ SOLN
INTRAMUSCULAR | Status: AC
Start: 2015-09-22 — End: 2015-09-22
  Filled 2015-09-22: qty 5

## 2015-09-22 MED ORDER — MIDAZOLAM HCL 5 MG/5ML IJ SOLN
INTRAMUSCULAR | Status: DC | PRN
  Administered 2015-09-22: 2 mg via INTRAVENOUS

## 2015-09-22 MED ORDER — SODIUM CHLORIDE 0.9 % IR SOLN
Status: DC | PRN
  Administered 2015-09-22: 1000 mL

## 2015-09-22 MED ORDER — SODIUM CHLORIDE 0.9 % IJ SOLN
INTRAMUSCULAR | Status: AC
  Filled 2015-09-22: qty 10

## 2015-09-22 MED ORDER — LIDOCAINE HCL (CARDIAC) 20 MG/ML IV SOLN
INTRAVENOUS | Status: AC
  Filled 2015-09-22: qty 5

## 2015-09-22 MED ORDER — LABETALOL HCL 5 MG/ML IV SOLN
INTRAVENOUS | Status: AC
  Filled 2015-09-22: qty 4

## 2015-09-22 MED ORDER — CLINDAMYCIN PHOSPHATE 900 MG/50ML IV SOLN
900.0000 mg | Freq: Three times a day (TID) | INTRAVENOUS | Status: AC
  Administered 2015-09-23 (×3): 900 mg via INTRAVENOUS
  Filled 2015-09-22 (×3): qty 50

## 2015-09-22 MED ORDER — DEXAMETHASONE SODIUM PHOSPHATE 4 MG/ML IJ SOLN
INTRAMUSCULAR | Status: DC | PRN
  Administered 2015-09-22: 4 mg via INTRAVENOUS

## 2015-09-22 SURGICAL SUPPLY — 117 items
BANDAGE ELASTIC 3 VELCRO ST LF (GAUZE/BANDAGES/DRESSINGS) IMPLANT
BANDAGE ELASTIC 4 VELCRO ST LF (GAUZE/BANDAGES/DRESSINGS) ×3 IMPLANT
BANDAGE ELASTIC 6 VELCRO ST LF (GAUZE/BANDAGES/DRESSINGS) ×3 IMPLANT
BANDAGE ESMARK 6X9 LF (GAUZE/BANDAGES/DRESSINGS) ×2 IMPLANT
BIT DRILL 100X2.5XANTM LCK (BIT) IMPLANT
BIT DRILL CAL (BIT) IMPLANT
BIT DRL 100X2.5XANTM LCK (BIT) ×2
BLADE SURG 10 STRL SS (BLADE) ×3 IMPLANT
BNDG CMPR 9X6 STRL LF SNTH (GAUZE/BANDAGES/DRESSINGS) ×2
BNDG COHESIVE 1X5 TAN STRL LF (GAUZE/BANDAGES/DRESSINGS) IMPLANT
BNDG COHESIVE 4X5 TAN STRL (GAUZE/BANDAGES/DRESSINGS) ×2 IMPLANT
BNDG COHESIVE 6X5 TAN STRL LF (GAUZE/BANDAGES/DRESSINGS) ×4 IMPLANT
BNDG CONFORM 3 STRL LF (GAUZE/BANDAGES/DRESSINGS) IMPLANT
BNDG ESMARK 6X9 LF (GAUZE/BANDAGES/DRESSINGS) ×3
BNDG GAUZE STRTCH 6 (GAUZE/BANDAGES/DRESSINGS) ×6 IMPLANT
BONE CANC CHIPS 40CC CAN1/2 (Bone Implant) ×3 IMPLANT
CANISTER SUCTION WELLS/JOHNSON (MISCELLANEOUS) ×1 IMPLANT
CHIPS CANC BONE 40CC CAN1/2 (Bone Implant) ×2 IMPLANT
CLEANER TIP ELECTROSURG 2X2 (MISCELLANEOUS) ×2 IMPLANT
CORDS BIPOLAR (ELECTRODE) IMPLANT
COVER MAYO STAND STRL (DRAPES) ×3 IMPLANT
COVER SURGICAL LIGHT HANDLE (MISCELLANEOUS) ×3 IMPLANT
CUFF TOURNIQUET SINGLE 18IN (TOURNIQUET CUFF) ×2 IMPLANT
CUFF TOURNIQUET SINGLE 24IN (TOURNIQUET CUFF) IMPLANT
CUFF TOURNIQUET SINGLE 34IN LL (TOURNIQUET CUFF) ×1 IMPLANT
CUFF TOURNIQUET SINGLE 44IN (TOURNIQUET CUFF) IMPLANT
DRAPE C-ARM 42X72 X-RAY (DRAPES) ×3 IMPLANT
DRAPE C-ARMOR (DRAPES) ×1 IMPLANT
DRAPE ORTHO SPLIT 77X108 STRL (DRAPES) ×6
DRAPE SURG 17X23 STRL (DRAPES) IMPLANT
DRAPE SURG ORHT 6 SPLT 77X108 (DRAPES) ×4 IMPLANT
DRAPE U-SHAPE 47X51 STRL (DRAPES) ×3 IMPLANT
DRILL BIT 2.5MM (BIT) ×3
DRILL BIT CAL (BIT) ×3
DRSG MEPILEX BORDER 4X4 (GAUZE/BANDAGES/DRESSINGS) ×2 IMPLANT
DRSG PAD ABDOMINAL 8X10 ST (GAUZE/BANDAGES/DRESSINGS) ×2 IMPLANT
DURAPREP 26ML APPLICATOR (WOUND CARE) ×2 IMPLANT
ELECT CAUTERY BLADE 6.4 (BLADE) IMPLANT
ELECT REM PT RETURN 9FT ADLT (ELECTROSURGICAL) ×3
ELECTRODE REM PT RTRN 9FT ADLT (ELECTROSURGICAL) ×2 IMPLANT
EVACUATOR 1/8 PVC DRAIN (DRAIN) IMPLANT
GAUZE SPONGE 4X4 12PLY STRL (GAUZE/BANDAGES/DRESSINGS) ×3 IMPLANT
GAUZE XEROFORM 1X8 LF (GAUZE/BANDAGES/DRESSINGS) ×3 IMPLANT
GAUZE XEROFORM 5X9 LF (GAUZE/BANDAGES/DRESSINGS) ×1 IMPLANT
GLOVE BIO SURGEON STRL SZ8 (GLOVE) ×3 IMPLANT
GLOVE BIOGEL PI IND STRL 8 (GLOVE) ×4 IMPLANT
GLOVE BIOGEL PI INDICATOR 8 (GLOVE) ×2
GLOVE ORTHO TXT STRL SZ7.5 (GLOVE) ×5 IMPLANT
GOWN EXTRA PROTECTION XXL 0583 (GOWNS) ×2 IMPLANT
GOWN STRL REUS W/ TWL LRG LVL3 (GOWN DISPOSABLE) ×4 IMPLANT
GOWN STRL REUS W/ TWL XL LVL3 (GOWN DISPOSABLE) ×8 IMPLANT
GOWN STRL REUS W/TWL LRG LVL3 (GOWN DISPOSABLE) ×6
GOWN STRL REUS W/TWL XL LVL3 (GOWN DISPOSABLE) ×9
GRAFT BNE CHIP CANC 1-8 40 (Bone Implant) IMPLANT
HANDPIECE INTERPULSE COAX TIP (DISPOSABLE)
IMMOBILIZER KNEE 22 UNIV (SOFTGOODS) IMPLANT
IMMOBILIZER KNEE 24 THIGH 36 (MISCELLANEOUS) IMPLANT
IMMOBILIZER KNEE 24 UNIV (MISCELLANEOUS) ×3
K-WIRE ACE 1.6X6 (WIRE) ×6
KIT BASIN OR (CUSTOM PROCEDURE TRAY) ×3 IMPLANT
KIT ROOM TURNOVER OR (KITS) ×3 IMPLANT
KWIRE ACE 1.6X6 (WIRE) IMPLANT
MANIFOLD NEPTUNE II (INSTRUMENTS) ×2 IMPLANT
NDL 18GX1X1/2 (RX/OR ONLY) (NEEDLE) IMPLANT
NEEDLE 18GX1X1/2 (RX/OR ONLY) (NEEDLE) ×3 IMPLANT
NS IRRIG 1000ML POUR BTL (IV SOLUTION) ×3 IMPLANT
PACK ORTHO EXTREMITY (CUSTOM PROCEDURE TRAY) ×3 IMPLANT
PAD ARMBOARD 7.5X6 YLW CONV (MISCELLANEOUS) ×6 IMPLANT
PAD CAST 4YDX4 CTTN HI CHSV (CAST SUPPLIES) ×2 IMPLANT
PADDING CAST ABS 4INX4YD NS (CAST SUPPLIES)
PADDING CAST ABS COTTON 4X4 ST (CAST SUPPLIES) ×2 IMPLANT
PADDING CAST COTTON 4X4 STRL (CAST SUPPLIES)
PADDING CAST COTTON 6X4 STRL (CAST SUPPLIES) ×2 IMPLANT
PENCIL BUTTON HOLSTER BLD 10FT (ELECTRODE) ×1 IMPLANT
PLATE LOCK 11H STD RT PROX TIB (Plate) ×2 IMPLANT
PLATE LOCK 12H 164 BILAT FIB (Plate) ×1 IMPLANT
SCREW 3.5MM CORT LP 34MM (Screw) ×2 IMPLANT
SCREW CORT 3.5X32 (Screw) ×3 IMPLANT
SCREW CORT T15 32X3.5XST LCK (Screw) IMPLANT
SCREW CORTICAL 3.5MM  34MM (Screw) ×1 IMPLANT
SCREW CORTICAL 3.5MM 34MM (Screw) IMPLANT
SCREW LOCK 3.5X80 DIST TIB (Screw) ×2 IMPLANT
SCREW LOCK CANC STAR 4X75 (Screw) ×2 IMPLANT
SCREW LOCK CANC STAR 4X80 (Screw) ×1 IMPLANT
SCREW LOCK CANC STAR 4X85 (Screw) ×2 IMPLANT
SCREW LOCK CORT STAR 3.5X32 (Screw) ×3 IMPLANT
SCREW LOCK CORT STAR 3.5X36 (Screw) ×2 IMPLANT
SCREW LOCK CORT STAR 3.5X42 (Screw) ×2 IMPLANT
SCREW LOCK CORT STAR 3.5X60 (Screw) ×2 IMPLANT
SCREW LOCK CORT STAR 3.5X65 (Screw) ×1 IMPLANT
SCREW LOCK CORT STAR 3.5X85 (Screw) ×1 IMPLANT
SCREW LOW PROF CORTICAL 3.5X80 (Screw) ×1 IMPLANT
SCREW LP 3.5X85MM (Screw) ×2 IMPLANT
SCREW LP 3.5X90MM (Screw) ×1 IMPLANT
SET HNDPC FAN SPRY TIP SCT (DISPOSABLE) IMPLANT
SPONGE LAP 18X18 X RAY DECT (DISPOSABLE) ×3 IMPLANT
STAPLER VISISTAT 35W (STAPLE) ×4 IMPLANT
STOCKINETTE IMPERVIOUS 9X36 MD (GAUZE/BANDAGES/DRESSINGS) ×3 IMPLANT
STOCKINETTE IMPERVIOUS LG (DRAPES) ×3 IMPLANT
SUCTION FRAZIER TIP 10 FR DISP (SUCTIONS) ×3 IMPLANT
SUT ETHILON 2 0 FS 18 (SUTURE) ×7 IMPLANT
SUT ETHILON 2 0 PSLX (SUTURE) ×1 IMPLANT
SUT ETHILON 3 0 PS 1 (SUTURE) ×4 IMPLANT
SUT VIC AB 0 CT1 27 (SUTURE) ×12
SUT VIC AB 0 CT1 27XBRD ANBCTR (SUTURE) ×6 IMPLANT
SUT VIC AB 2-0 CT1 27 (SUTURE) ×12
SUT VIC AB 2-0 CT1 TAPERPNT 27 (SUTURE) ×2 IMPLANT
SYR 50ML SLIP (SYRINGE) ×1 IMPLANT
SYR BULB IRRIGATION 50ML (SYRINGE) ×2 IMPLANT
SYR CONTROL 10ML LL (SYRINGE) IMPLANT
TOWEL OR 17X24 6PK STRL BLUE (TOWEL DISPOSABLE) ×3 IMPLANT
TOWEL OR 17X26 10 PK STRL BLUE (TOWEL DISPOSABLE) ×3 IMPLANT
TUBE ANAEROBIC SPECIMEN COL (MISCELLANEOUS) IMPLANT
TUBE CONNECTING 12X1/4 (SUCTIONS) ×3 IMPLANT
TUBE FEEDING 5FR 15 INCH (TUBING) IMPLANT
UNDERPAD 30X30 INCONTINENT (UNDERPADS AND DIAPERS) ×3 IMPLANT
YANKAUER SUCT BULB TIP NO VENT (SUCTIONS) ×3 IMPLANT

## 2015-09-22 NOTE — Anesthesia Procedure Notes (Signed)
Procedure Name: Intubation Date/Time: 09/22/2015 7:16 PM Performed by: Layla Maw Pre-anesthesia Checklist: Patient identified, Patient being monitored, Timeout performed, Emergency Drugs available and Suction available Patient Re-evaluated:Patient Re-evaluated prior to inductionOxygen Delivery Method: Circle System Utilized Preoxygenation: Pre-oxygenation with 100% oxygen Intubation Type: IV induction Ventilation: Two handed mask ventilation required and Oral airway inserted - appropriate to patient size Laryngoscope Size: Sabra Heck and 3 Grade View: Grade I Tube type: Oral Tube size: 7.0 mm Number of attempts: 1 Airway Equipment and Method: Stylet Placement Confirmation: ETT inserted through vocal cords under direct vision,  positive ETCO2 and breath sounds checked- equal and bilateral Secured at: 21 cm Tube secured with: Tape Dental Injury: Teeth and Oropharynx as per pre-operative assessment

## 2015-09-22 NOTE — Anesthesia Postprocedure Evaluation (Signed)
Anesthesia Post Note  Patient: FARD BORUNDA  Procedure(s) Performed: Procedure(s) (LRB): REMOVAL OF EXTERNAL FIXATOR RIGHT LEG, OPEN REDUCTION INTERNAL FIXATION (ORIF) RIGHT TIBIAL PLATEAU, EXCISION SMALL MASS LEFT LEG (Right) REMOVAL EXTERNAL FIXATION LEG (Right) EXCISION MASS (Left)  Patient location during evaluation: PACU Anesthesia Type: General Level of consciousness: awake and alert Pain management: pain level controlled Vital Signs Assessment: post-procedure vital signs reviewed and stable Respiratory status: spontaneous breathing, nonlabored ventilation, respiratory function stable and patient connected to nasal cannula oxygen Cardiovascular status: blood pressure returned to baseline and stable Postop Assessment: no signs of nausea or vomiting Anesthetic complications: no    Last Vitals:  Filed Vitals:   09/22/15 2225 09/22/15 2233  BP:    Pulse: 82   Temp:  36.3 C  Resp: 21     Last Pain:  Filed Vitals:   09/22/15 2236  PainSc: Capron Hollis

## 2015-09-22 NOTE — Progress Notes (Signed)
Patient ID: Lucas [name], male   DOB: 08/26/1951, 64 y.o.   MRN: [id number] Doing well this am.  Vitals stable.  He understands fully that we will proceed to the OR for surgery late today to fix his right proximal tibia fracture and then remove a small cyst-like mass from his left knee.

## 2015-09-22 NOTE — Transfer of Care (Signed)
Immediate Anesthesia Transfer of Care Note  Patient: Lucas [name]  Procedure(s) Performed: Procedure(s): REMOVAL OF EXTERNAL FIXATOR RIGHT LEG, OPEN REDUCTION INTERNAL FIXATION (ORIF) RIGHT TIBIAL PLATEAU, EXCISION SMALL MASS LEFT LEG (Right) REMOVAL EXTERNAL FIXATION LEG (Right) EXCISION MASS (Left)  Patient Location: PACU  Anesthesia Type:General  Level of Consciousness: awake, alert , patient cooperative and responds to stimulation  Airway & Oxygen Therapy: Patient Spontanous Breathing and Patient connected to nasal cannula oxygen  Post-op Assessment: Report given to RN, Post -op Vital signs reviewed and stable and Patient moving all extremities X 4  Post vital signs: Reviewed and stable  Last Vitals:  Filed Vitals:   09/22/15 0528 09/22/15 1432  BP: 127/82 146/73  Pulse: 85 78  Temp: 37 C 37.4 C  Resp: 18 16    Complications: No apparent anesthesia complications

## 2015-09-22 NOTE — Anesthesia Preprocedure Evaluation (Signed)
Anesthesia Evaluation  Patient identified by MRN, date of birth, ID band Patient awake    Reviewed: Allergy & Precautions, NPO status , Patient's Chart, lab work & pertinent test results  Airway Mallampati: II  TM Distance: >3 FB Neck ROM: Full    Dental  (+) Edentulous Upper, Missing, Dental Advisory Given,    Pulmonary Current Smoker,    breath sounds clear to auscultation       Cardiovascular hypertension,  Rhythm:Regular Rate:Normal     Neuro/Psych    GI/Hepatic   Endo/Other    Renal/GU      Musculoskeletal   Abdominal   Peds  Hematology   Anesthesia Other Findings   Reproductive/Obstetrics                             Anesthesia Physical Anesthesia Plan  ASA: III  Anesthesia Plan: General   Post-op Pain Management:    Induction: Intravenous  Airway Management Planned: Oral ETT  Additional Equipment:   Intra-op Plan:   Post-operative Plan: Extubation in OR  Informed Consent: I have reviewed the patients History and Physical, chart, labs and discussed the procedure including the risks, benefits and alternatives for the proposed anesthesia with the patient or authorized representative who has indicated his/her understanding and acceptance.   Dental advisory given  Plan Discussed with: CRNA and Anesthesiologist  Anesthesia Plan Comments:         Anesthesia Quick Evaluation

## 2015-09-22 NOTE — Brief Op Note (Signed)
09/16/2015 - 09/22/2015  9:36 PM  PATIENT:  Lucas [name]  64 y.o. male  PRE-OPERATIVE DIAGNOSIS:  right tibial plateau fracture, left knee/leg mass  POST-OPERATIVE DIAGNOSIS:  right tibial plateau fracture, left knee/leg mass  PROCEDURE:  Procedure(s): REMOVAL OF EXTERNAL FIXATOR RIGHT LEG, OPEN REDUCTION INTERNAL FIXATION (ORIF) RIGHT TIBIAL PLATEAU, EXCISION SMALL MASS LEFT LEG (Right) REMOVAL EXTERNAL FIXATION LEG (Right) EXCISION MASS (Left)  SURGEON:  Surgeon(s) and Role:    * Mcarthur Rossetti, MD - Primary  PHYSICIAN ASSISTANT: Benita Stabile, PA-C  ANESTHESIA:   general  EBL:  Total I/O In: 900 [I.V.:900] Out: 20 [Blood:20]  BLOOD ADMINISTERED:none  DRAINS: none   LOCAL MEDICATIONS USED:  NONE  SPECIMEN:  No Specimen  DISPOSITION OF SPECIMEN:  N/A  COUNTS:  YES  TOURNIQUET:   Total Tourniquet Time Documented: Thigh (Right) - 101 minutes Total: Thigh (Right) - 101 minutes   DICTATION: .Other Dictation: Dictation Number [id number]  PLAN OF CARE: Admit to inpatient   PATIENT DISPOSITION:  PACU - hemodynamically stable.   Delay start of Pharmacological VTE agent (>24hrs) due to surgical blood loss or risk of bleeding: no

## 2015-09-23 ENCOUNTER — Encounter (HOSPITAL_COMMUNITY): Payer: Self-pay | Admitting: Orthopaedic Surgery

## 2015-09-23 MED ORDER — ASPIRIN 325 MG PO TABS
325.0000 mg | ORAL_TABLET | Freq: Two times a day (BID) | ORAL | Status: DC
  Administered 2015-09-23 – 2015-09-25 (×5): 325 mg via ORAL
  Filled 2015-09-23 (×5): qty 1
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
## 2015-09-23 NOTE — Progress Notes (Signed)
Subjective: 1 Day Post-Op Procedure(s) (LRB): REMOVAL OF EXTERNAL FIXATOR RIGHT LEG, OPEN REDUCTION INTERNAL FIXATION (ORIF) RIGHT TIBIAL PLATEAU, EXCISION SMALL MASS LEFT LEG (Right) REMOVAL EXTERNAL FIXATION LEG (Right) EXCISION MASS (Left) Patient reports pain as moderate.  Tolerated surgery well last evening.  Objective: Vital signs in last 24 hours: Temp:  [97.3 F (36.3 C)-99.4 F (37.4 C)] 98.3 F (36.8 C) (11/30 0534) Pulse Rate:  [77-87] 82 (11/30 0534) Resp:  [15-21] 20 (11/30 0534) BP: (146-158)/(70-82) 150/70 mmHg (11/30 0534) SpO2:  [97 %-100 %] 98 % (11/30 0534)  Intake/Output from previous day: 11/29 0701 - 11/30 0700 In: [place] [P.O.:240; I.V.:1600] Out: 470 [Urine:450; Blood:20] Intake/Output this shift:    No results for input(s): HGB in the last 72 hours. No results for input(s): WBC, RBC, HCT, PLT in the last 72 hours. No results for input(s): NA, K, CL, CO2, BUN, CREATININE, GLUCOSE, CALCIUM in the last 72 hours. No results for input(s): LABPT, INR in the last 72 hours.  Sensation intact distally Intact pulses distally Dorsiflexion/Plantar flexion intact Incision: dressing C/D/I Compartment soft  Assessment/Plan: 1 Day Post-Op Procedure(s) (LRB): REMOVAL OF EXTERNAL FIXATOR RIGHT LEG, OPEN REDUCTION INTERNAL FIXATION (ORIF) RIGHT TIBIAL PLATEAU, EXCISION SMALL MASS LEFT LEG (Right) REMOVAL EXTERNAL FIXATION LEG (Right) EXCISION MASS (Left) Up with therapy - NWB right leg; WBAT left leg Discharge once clears therapy  Lucas [name] Y 09/23/2015, 7:18 AM
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
## 2015-09-23 NOTE — Care Management Note (Signed)
Case Management Note  Patient Details  Name: Lucas [name] MRN: [id number] Date of Birth: Jan 01, 1951  Subjective/Objective:       Admitted with fractured right tibial plateau, s/p ORIF 09/22/15             Action/Plan: Spoke with patient about home health, he chose [facility]. Contacted Edwina at [facility] and set up [facility]. Patient states that he has a rolling walker and a 3N1 at home. Patient states that his wife will be able to assist him after discharge.Will continue to follow for d/c needs.    Expected Discharge Date:  09/23/15               Expected Discharge Plan:  [place]  In-House Referral:  NA  Discharge planning Services  CM Consult  Post Acute Care Choice:  Home Health Choice offered to:  Patient  DME Arranged:    DME Agency:     HH Arranged:  PT HH Agency:  [facility]  Status of Service:  Completed, signed off  Medicare Important Message Given:  Yes Date Medicare IM Given:    Medicare IM give by:    Date Additional Medicare IM Given:    Additional Medicare Important Message give by:     If discussed at [place] of Stay Meetings, dates discussed:    Additional Comments:  Nila Nephew, RN 09/23/2015, 3:36 PM

## 2015-09-23 NOTE — Progress Notes (Signed)
Orthopedic Tech Progress Note Patient Details:  LORENE SAMAAN Mar 23, 1951 [id number]  Patient ID: [name], male   DOB: Mar 01, 1951, 64 y.o.   MRN: [id number] Called in bio-tech brace order; spoke with Dolores Lory, Trystyn Sitts 09/23/2015, 1:06 PM

## 2015-09-23 NOTE — Progress Notes (Signed)
Orthopedic Tech Progress Note Patient Details:  [name] 21-Nov-1950 [id number] Brace order completed by bio-tech vendor. Patient ID: Lucas [name], male   DOB: 1951/05/09, 64 y.o.   MRN: [id number]   Braulio Bosch 09/23/2015, 4:33 PM

## 2015-09-24 ENCOUNTER — Encounter (HOSPITAL_COMMUNITY): Payer: Self-pay | Admitting: Orthopaedic Surgery

## 2015-09-24 NOTE — Care Management Important Message (Signed)
Important Message  Patient Details  Name: Lucas [name] MRN: [id number] Date of Birth: 02-17-1951   Medicare Important Message Given:  Yes    [name] P [employer] 09/24/2015, 1:35 PM
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
## 2015-09-24 NOTE — Progress Notes (Signed)
Subjective: 2 Days Post-Op Procedure(s) (LRB): REMOVAL OF EXTERNAL FIXATOR RIGHT LEG, OPEN REDUCTION INTERNAL FIXATION (ORIF) RIGHT TIBIAL PLATEAU, EXCISION SMALL MASS LEFT LEG (Right) REMOVAL EXTERNAL FIXATION LEG (Right) EXCISION MASS (Left) Patient reports pain as moderate.    Objective: Vital signs in last 24 hours: Temp:  [99.3 F (37.4 C)-100.2 F (37.9 C)] 100.2 F (37.9 C) (12/01 0611) Pulse Rate:  [91-94] 94 (12/01 0611) Resp:  [18-20] 19 (12/01 0611) BP: (118-138)/(68-84) 137/68 mmHg (12/01 0611) SpO2:  [98 %-100 %] 98 % (12/01 0611)  Intake/Output from previous day: 11/30 0701 - 12/01 0700 In: 1200 [P.O.:1200] Out: 1900 [Urine:1900] Intake/Output this shift:    No results for input(s): HGB in the last 72 hours. No results for input(s): WBC, RBC, HCT, PLT in the last 72 hours. No results for input(s): NA, K, CL, CO2, BUN, CREATININE, GLUCOSE, CALCIUM in the last 72 hours. No results for input(s): LABPT, INR in the last 72 hours.  Sensation intact distally Intact pulses distally Dorsiflexion/Plantar flexion intact Incision: scant drainage No cellulitis present Compartment soft  Assessment/Plan: 2 Days Post-Op Procedure(s) (LRB): REMOVAL OF EXTERNAL FIXATOR RIGHT LEG, OPEN REDUCTION INTERNAL FIXATION (ORIF) RIGHT TIBIAL PLATEAU, EXCISION SMALL MASS LEFT LEG (Right) REMOVAL EXTERNAL FIXATION LEG (Right) EXCISION MASS (Left) Up with therapy Plan for discharge tomorrow Discharge home with home health  Mcarthur Rossetti 09/24/2015, 7:10 AM

## 2015-09-25 MED ORDER — ASPIRIN 325 MG PO TABS: 325.0000 mg | ORAL_TABLET | Freq: Two times a day (BID) | ORAL | Status: DC

## 2015-09-25 MED ORDER — OXYCODONE-ACETAMINOPHEN 5-325 MG PO TABS: 1.0000 | ORAL_TABLET | ORAL | Status: DC | PRN

## 2015-09-25 NOTE — Discharge Instructions (Signed)
No weight at all on your right leg. You can remove all of your dressings on 09/29/15 and start getting your incisions wet daily in the shower. Ice as needed for swelling.
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
## 2015-09-25 NOTE — Progress Notes (Signed)
Patient ID: Lucas [name], male   DOB: Mar 05, 1951, 64 y.o.   MRN: [id number] Doing well.  Can go home today.

## 2015-09-30 ENCOUNTER — Other Ambulatory Visit: Payer: Self-pay | Admitting: Obstetrics and Gynecology

## 2015-10-07 ENCOUNTER — Other Ambulatory Visit: Payer: Self-pay | Admitting: Obstetrics and Gynecology

## 2015-10-17 ENCOUNTER — Emergency Department (HOSPITAL_COMMUNITY): Payer: Medicare HMO | Admitting: Anesthesiology

## 2015-10-17 ENCOUNTER — Inpatient Hospital Stay (HOSPITAL_COMMUNITY): Payer: Medicare HMO

## 2015-10-17 ENCOUNTER — Inpatient Hospital Stay (HOSPITAL_COMMUNITY)
Admission: EM | Admit: 2015-10-17 | Discharge: 2015-10-28 | DRG: 857 | Disposition: A | Discharge status: Home Health | Payer: Medicare HMO | Attending: Orthopaedic Surgery | Admitting: Orthopaedic Surgery

## 2015-10-17 ENCOUNTER — Encounter (HOSPITAL_COMMUNITY): Payer: Self-pay | Admitting: Emergency Medicine

## 2015-10-17 ENCOUNTER — Encounter (HOSPITAL_COMMUNITY)
Admission: EM | Disposition: A | Discharge status: Home Health | Payer: Self-pay | Source: Home / Self Care | Attending: Orthopaedic Surgery

## 2015-10-17 DIAGNOSIS — B9561 Methicillin susceptible Staphylococcus aureus infection as the cause of diseases classified elsewhere: Secondary | ICD-10-CM | POA: Diagnosis present

## 2015-10-17 DIAGNOSIS — I10 Essential (primary) hypertension: Secondary | ICD-10-CM | POA: Diagnosis present

## 2015-10-17 DIAGNOSIS — E785 Hyperlipidemia, unspecified: Secondary | ICD-10-CM | POA: Diagnosis present

## 2015-10-17 DIAGNOSIS — Z7982 Long term (current) use of aspirin: Secondary | ICD-10-CM

## 2015-10-17 DIAGNOSIS — M869 Osteomyelitis, unspecified: Secondary | ICD-10-CM | POA: Diagnosis present

## 2015-10-17 DIAGNOSIS — F1020 Alcohol dependence, uncomplicated: Secondary | ICD-10-CM | POA: Diagnosis present

## 2015-10-17 DIAGNOSIS — T8131XA Disruption of external operation (surgical) wound, not elsewhere classified, initial encounter: Secondary | ICD-10-CM | POA: Diagnosis present

## 2015-10-17 DIAGNOSIS — F17210 Nicotine dependence, cigarettes, uncomplicated: Secondary | ICD-10-CM | POA: Diagnosis present

## 2015-10-17 DIAGNOSIS — S82141A Displaced bicondylar fracture of right tibia, initial encounter for closed fracture: Secondary | ICD-10-CM | POA: Diagnosis present

## 2015-10-17 DIAGNOSIS — K5900 Constipation, unspecified: Secondary | ICD-10-CM | POA: Diagnosis not present

## 2015-10-17 DIAGNOSIS — T8140XA Infection following a procedure, unspecified, initial encounter: Secondary | ICD-10-CM

## 2015-10-17 DIAGNOSIS — Z880 Allergy status to penicillin: Secondary | ICD-10-CM

## 2015-10-17 DIAGNOSIS — Z885 Allergy status to narcotic agent status: Secondary | ICD-10-CM | POA: Diagnosis not present

## 2015-10-17 DIAGNOSIS — J449 Chronic obstructive pulmonary disease, unspecified: Secondary | ICD-10-CM | POA: Diagnosis present

## 2015-10-17 DIAGNOSIS — A4901 Methicillin susceptible Staphylococcus aureus infection, unspecified site: Secondary | ICD-10-CM | POA: Diagnosis present

## 2015-10-17 DIAGNOSIS — Z79899 Other long term (current) drug therapy: Secondary | ICD-10-CM

## 2015-10-17 DIAGNOSIS — T814XXA Infection following a procedure, initial encounter: Principal | ICD-10-CM | POA: Diagnosis present

## 2015-10-17 DIAGNOSIS — L0390 Cellulitis, unspecified: Secondary | ICD-10-CM | POA: Diagnosis present

## 2015-10-17 DIAGNOSIS — Z888 Allergy status to other drugs, medicaments and biological substances status: Secondary | ICD-10-CM

## 2015-10-17 DIAGNOSIS — T8130XA Disruption of wound, unspecified, initial encounter: Secondary | ICD-10-CM | POA: Insufficient documentation

## 2015-10-17 DIAGNOSIS — F1010 Alcohol abuse, uncomplicated: Secondary | ICD-10-CM | POA: Diagnosis present

## 2015-10-17 DIAGNOSIS — D62 Acute posthemorrhagic anemia: Secondary | ICD-10-CM | POA: Diagnosis not present

## 2015-10-17 DIAGNOSIS — M86161 Other acute osteomyelitis, right tibia and fibula: Secondary | ICD-10-CM | POA: Diagnosis not present

## 2015-10-17 HISTORY — PX: I & D EXTREMITY: SHX5045

## 2015-10-17 HISTORY — PX: APPLICATION OF WOUND VAC: SHX5189

## 2015-10-17 LAB — COMPREHENSIVE METABOLIC PANEL
ALK PHOS: 127 U/L — AB (ref 38–126)
ALT: 17 U/L (ref 17–63)
AST: 31 U/L (ref 15–41)
Albumin: 3.3 g/dL — ABNORMAL LOW (ref 3.5–5.0)
Anion gap: 11 (ref 5–15)
BILIRUBIN TOTAL: 0.6 mg/dL (ref 0.3–1.2)
BUN: 7 mg/dL (ref 6–20)
CHLORIDE: 99 mmol/L — AB (ref 101–111)
CO2: 24 mmol/L (ref 22–32)
CREATININE: 1.08 mg/dL (ref 0.61–1.24)
Calcium: 10.2 mg/dL (ref 8.9–10.3)
GFR calc Af Amer: >60 mL/min (ref >60)
Glucose, Bld: 112 mg/dL — ABNORMAL HIGH (ref 65–99)
Potassium: 4.2 mmol/L (ref 3.5–5.1)
Sodium: 134 mmol/L — ABNORMAL LOW (ref 135–145)
Total Protein: 7.6 g/dL (ref 6.5–8.1)

## 2015-10-17 LAB — CBC WITH DIFFERENTIAL/PLATELET
BASOS PCT: 0 %
Basophils Absolute: 0 10*3/uL (ref 0.0–0.1)
Eosinophils Absolute: 0.1 10*3/uL (ref 0.0–0.7)
Eosinophils Relative: 1 %
HEMATOCRIT: 35.9 % — AB (ref 39.0–52.0)
Hemoglobin: 12 g/dL — ABNORMAL LOW (ref 13.0–17.0)
Lymphocytes Relative: 24 %
Lymphs Abs: 1.6 10*3/uL (ref 0.7–4.0)
MCH: 30.9 pg (ref 26.0–34.0)
MCHC: 33.4 g/dL (ref 30.0–36.0)
MCV: 92.5 fL (ref 78.0–100.0)
MONO ABS: 0.7 10*3/uL (ref 0.1–1.0)
MONOS PCT: 11 %
NEUTROS ABS: 4.2 10*3/uL (ref 1.7–7.7)
Neutrophils Relative %: 64 %
Platelets: 308 10*3/uL (ref 150–400)
RBC: 3.88 MIL/uL — ABNORMAL LOW (ref 4.22–5.81)
RDW: 13.3 % (ref 11.5–15.5)
WBC: 6.6 10*3/uL (ref 4.0–10.5)

## 2015-10-17 LAB — I-STAT CG4 LACTIC ACID, ED: LACTIC ACID, VENOUS: 2.21 mmol/L — AB (ref 0.5–2.0)

## 2015-10-17 SURGERY — IRRIGATION AND DEBRIDEMENT EXTREMITY
Anesthesia: General | Site: Leg Lower | Laterality: Right

## 2015-10-17 MED ORDER — OXYCODONE HCL 5 MG PO TABS
5.0000 mg | ORAL_TABLET | ORAL | Status: DC | PRN
  Administered 2015-10-18 – 2015-10-20 (×3): 10 mg via ORAL
  Administered 2015-10-21 – 2015-10-22 (×3): 5 mg via ORAL
  Administered 2015-10-22 – 2015-10-23 (×3): 10 mg via ORAL
  Administered 2015-10-24: 5 mg via ORAL
  Administered 2015-10-24 – 2015-10-25 (×7): 10 mg via ORAL
  Administered 2015-10-25: 5 mg via ORAL
  Administered 2015-10-26 – 2015-10-28 (×9): 10 mg via ORAL
  Filled 2015-10-17 (×2): qty 2
  Filled 2015-10-17: qty 1
  Filled 2015-10-17 (×5): qty 2
  Filled 2015-10-17: qty 1
  Filled 2015-10-17 (×6): qty 2
  Filled 2015-10-17: qty 1
  Filled 2015-10-17 (×8): qty 2
  Filled 2015-10-17 (×2): qty 1
  Filled 2015-10-17 (×2): qty 2

## 2015-10-17 MED ORDER — SODIUM CHLORIDE 0.9 % IR SOLN
Status: DC | PRN
  Administered 2015-10-17: 3000 mL

## 2015-10-17 MED ORDER — METOCLOPRAMIDE HCL 5 MG PO TABS: 5.0000 mg | ORAL_TABLET | Freq: Three times a day (TID) | ORAL | Status: DC | PRN

## 2015-10-17 MED ORDER — METHOCARBAMOL 1000 MG/10ML IJ SOLN
500.0000 mg | Freq: Four times a day (QID) | INTRAVENOUS | Status: DC | PRN
  Filled 2015-10-17: qty 5

## 2015-10-17 MED ORDER — SODIUM CHLORIDE 0.9 % IV BOLUS (SEPSIS)
1000.0000 mL | Freq: Once | INTRAVENOUS | Status: AC
  Administered 2015-10-17: 1000 mL via INTRAVENOUS

## 2015-10-17 MED ORDER — LIDOCAINE HCL (CARDIAC) 20 MG/ML IV SOLN
INTRAVENOUS | Status: AC
  Filled 2015-10-17: qty 5

## 2015-10-17 MED ORDER — SUCCINYLCHOLINE CHLORIDE 20 MG/ML IJ SOLN
INTRAMUSCULAR | Status: DC | PRN
  Administered 2015-10-17: 140 mg via INTRAVENOUS

## 2015-10-17 MED ORDER — ACETAMINOPHEN 325 MG PO TABS
650.0000 mg | ORAL_TABLET | Freq: Four times a day (QID) | ORAL | Status: DC | PRN
  Administered 2015-10-24: 650 mg via ORAL
  Filled 2015-10-17: qty 2

## 2015-10-17 MED ORDER — LOSARTAN POTASSIUM 50 MG PO TABS
50.0000 mg | ORAL_TABLET | Freq: Every day | ORAL | Status: DC
  Administered 2015-10-18 – 2015-10-28 (×10): 50 mg via ORAL
  Filled 2015-10-17 (×11): qty 1

## 2015-10-17 MED ORDER — ASPIRIN 325 MG PO TABS
325.0000 mg | ORAL_TABLET | Freq: Two times a day (BID) | ORAL | Status: DC
  Administered 2015-10-18 – 2015-10-28 (×19): 325 mg via ORAL
  Filled 2015-10-17 (×18): qty 1

## 2015-10-17 MED ORDER — VANCOMYCIN HCL IN DEXTROSE 750-5 MG/150ML-% IV SOLN
750.0000 mg | Freq: Two times a day (BID) | INTRAVENOUS | Status: DC
  Administered 2015-10-17 – 2015-10-22 (×10): 750 mg via INTRAVENOUS
  Filled 2015-10-17 (×12): qty 150

## 2015-10-17 MED ORDER — ONDANSETRON HCL 4 MG PO TABS: 4.0000 mg | ORAL_TABLET | Freq: Four times a day (QID) | ORAL | Status: DC | PRN

## 2015-10-17 MED ORDER — PHENYLEPHRINE HCL 10 MG/ML IJ SOLN
INTRAMUSCULAR | Status: DC | PRN
  Administered 2015-10-17: 100 ug via INTRAVENOUS
  Administered 2015-10-17: 80 ug via INTRAVENOUS

## 2015-10-17 MED ORDER — ONDANSETRON HCL 4 MG/2ML IJ SOLN
INTRAMUSCULAR | Status: DC | PRN
  Administered 2015-10-17: 4 mg via INTRAVENOUS

## 2015-10-17 MED ORDER — LOSARTAN POTASSIUM-HCTZ 50-12.5 MG PO TABS: 1.0000 | ORAL_TABLET | Freq: Every day | ORAL | Status: DC

## 2015-10-17 MED ORDER — LIDOCAINE HCL (CARDIAC) 20 MG/ML IV SOLN
INTRAVENOUS | Status: DC | PRN
  Administered 2015-10-17: 60 mg via INTRATRACHEAL

## 2015-10-17 MED ORDER — FENTANYL CITRATE (PF) 250 MCG/5ML IJ SOLN
INTRAMUSCULAR | Status: AC
  Filled 2015-10-17: qty 5

## 2015-10-17 MED ORDER — METOCLOPRAMIDE HCL 5 MG/ML IJ SOLN: 5.0000 mg | Freq: Three times a day (TID) | INTRAMUSCULAR | Status: DC | PRN

## 2015-10-17 MED ORDER — SODIUM CHLORIDE 0.9 % IR SOLN
Status: DC | PRN
  Administered 2015-10-17: 1000 mL

## 2015-10-17 MED ORDER — SIMVASTATIN 40 MG PO TABS
40.0000 mg | ORAL_TABLET | Freq: Every day | ORAL | Status: DC
  Administered 2015-10-18 – 2015-10-27 (×10): 40 mg via ORAL
  Filled 2015-10-17 (×9): qty 1

## 2015-10-17 MED ORDER — PROPOFOL 10 MG/ML IV BOLUS
INTRAVENOUS | Status: AC
  Filled 2015-10-17: qty 20

## 2015-10-17 MED ORDER — ONDANSETRON HCL 4 MG/2ML IJ SOLN
INTRAMUSCULAR | Status: AC
  Filled 2015-10-17: qty 2

## 2015-10-17 MED ORDER — VANCOMYCIN HCL IN DEXTROSE 1-5 GM/200ML-% IV SOLN
1000.0000 mg | Freq: Once | INTRAVENOUS | Status: AC
  Administered 2015-10-17: 1000 mg via INTRAVENOUS
  Filled 2015-10-17: qty 200

## 2015-10-17 MED ORDER — OXYCODONE HCL 5 MG PO TABS: 5.0000 mg | ORAL_TABLET | Freq: Once | ORAL | Status: DC | PRN

## 2015-10-17 MED ORDER — LOSARTAN POTASSIUM 50 MG PO TABS: 50.0000 mg | ORAL_TABLET | Freq: Every day | ORAL | Status: DC

## 2015-10-17 MED ORDER — ALBUTEROL SULFATE (2.5 MG/3ML) 0.083% IN NEBU: 3.0000 mL | INHALATION_SOLUTION | RESPIRATORY_TRACT | Status: DC | PRN

## 2015-10-17 MED ORDER — SODIUM CHLORIDE 0.9 % IV SOLN
INTRAVENOUS | Status: DC
  Administered 2015-10-17: 125 mL/h via INTRAVENOUS
  Administered 2015-10-18 – 2015-10-21 (×5): via INTRAVENOUS

## 2015-10-17 MED ORDER — HYDROMORPHONE HCL 1 MG/ML IJ SOLN: 0.2500 mg | INTRAMUSCULAR | Status: DC | PRN

## 2015-10-17 MED ORDER — FENTANYL CITRATE (PF) 250 MCG/5ML IJ SOLN
INTRAMUSCULAR | Status: DC | PRN
  Administered 2015-10-17: 150 ug via INTRAVENOUS
  Administered 2015-10-17 (×2): 50 ug via INTRAVENOUS

## 2015-10-17 MED ORDER — ONDANSETRON HCL 4 MG/2ML IJ SOLN: 4.0000 mg | Freq: Four times a day (QID) | INTRAMUSCULAR | Status: DC | PRN

## 2015-10-17 MED ORDER — MEPERIDINE HCL 25 MG/ML IJ SOLN: 6.2500 mg | INTRAMUSCULAR | Status: DC | PRN

## 2015-10-17 MED ORDER — PROPOFOL 10 MG/ML IV BOLUS
INTRAVENOUS | Status: DC | PRN
  Administered 2015-10-17: 200 mg via INTRAVENOUS

## 2015-10-17 MED ORDER — HYDROCHLOROTHIAZIDE 12.5 MG PO CAPS
12.5000 mg | ORAL_CAPSULE | Freq: Every day | ORAL | Status: DC
  Administered 2015-10-18 – 2015-10-28 (×10): 12.5 mg via ORAL
  Filled 2015-10-17 (×10): qty 1

## 2015-10-17 MED ORDER — METHOCARBAMOL 500 MG PO TABS
500.0000 mg | ORAL_TABLET | Freq: Four times a day (QID) | ORAL | Status: DC | PRN
  Administered 2015-10-23 – 2015-10-27 (×7): 500 mg via ORAL
  Filled 2015-10-17 (×7): qty 1

## 2015-10-17 MED ORDER — ACETAMINOPHEN 650 MG RE SUPP: 650.0000 mg | Freq: Four times a day (QID) | RECTAL | Status: DC | PRN

## 2015-10-17 MED ORDER — MIDAZOLAM HCL 2 MG/2ML IJ SOLN
INTRAMUSCULAR | Status: AC
  Filled 2015-10-17: qty 2

## 2015-10-17 MED ORDER — MORPHINE SULFATE (PF) 2 MG/ML IV SOLN
1.0000 mg | INTRAVENOUS | Status: DC | PRN
  Administered 2015-10-23 – 2015-10-26 (×4): 1 mg via INTRAVENOUS
  Filled 2015-10-17 (×4): qty 1

## 2015-10-17 MED ORDER — OXYCODONE HCL 5 MG/5ML PO SOLN: 5.0000 mg | Freq: Once | ORAL | Status: DC | PRN

## 2015-10-17 MED ORDER — HYDROCHLOROTHIAZIDE 12.5 MG PO CAPS: 12.5000 mg | ORAL_CAPSULE | Freq: Every day | ORAL | Status: DC

## 2015-10-17 MED ORDER — SUCCINYLCHOLINE CHLORIDE 20 MG/ML IJ SOLN
INTRAMUSCULAR | Status: AC
  Filled 2015-10-17: qty 1

## 2015-10-17 MED ORDER — LACTATED RINGERS IV SOLN
INTRAVENOUS | Status: DC | PRN
  Administered 2015-10-17: 16:00:00 via INTRAVENOUS

## 2015-10-17 MED ORDER — DIPHENHYDRAMINE HCL 12.5 MG/5ML PO ELIX
25.0000 mg | ORAL_SOLUTION | ORAL | Status: DC | PRN
Start: 2015-10-17 — End: 2015-10-28

## 2015-10-17 SURGICAL SUPPLY — 61 items
BANDAGE ELASTIC 3 VELCRO ST LF (GAUZE/BANDAGES/DRESSINGS) IMPLANT
BLADE SURG 10 STRL SS (BLADE) ×4 IMPLANT
BNDG COHESIVE 1X5 TAN STRL LF (GAUZE/BANDAGES/DRESSINGS) IMPLANT
BNDG COHESIVE 4X5 TAN STRL (GAUZE/BANDAGES/DRESSINGS) ×3 IMPLANT
BNDG COHESIVE 6X5 TAN STRL LF (GAUZE/BANDAGES/DRESSINGS) ×4 IMPLANT
BNDG CONFORM 3 STRL LF (GAUZE/BANDAGES/DRESSINGS) IMPLANT
BNDG GAUZE STRTCH 6 (GAUZE/BANDAGES/DRESSINGS) ×3 IMPLANT
CONT SPEC 4OZ CLIKSEAL STRL BL (MISCELLANEOUS) ×2 IMPLANT
CORDS BIPOLAR (ELECTRODE) IMPLANT
COVER SURGICAL LIGHT HANDLE (MISCELLANEOUS) ×3 IMPLANT
CUFF TOURNIQUET SINGLE 24IN (TOURNIQUET CUFF) IMPLANT
CUFF TOURNIQUET SINGLE 34IN LL (TOURNIQUET CUFF) ×2 IMPLANT
CUFF TOURNIQUET SINGLE 44IN (TOURNIQUET CUFF) IMPLANT
DRAPE EXTREMITY BILATERAL (DRAPE) IMPLANT
DRAPE IMP U-DRAPE 54X76 (DRAPES) IMPLANT
DRAPE INCISE IOBAN 66X45 STRL (DRAPES) ×9 IMPLANT
DRAPE SURG 17X23 STRL (DRAPES) IMPLANT
DRAPE U-SHAPE 47X51 STRL (DRAPES) ×3 IMPLANT
DRSG VAC ATS SM SENSATRAC (GAUZE/BANDAGES/DRESSINGS) ×2 IMPLANT
DURAPREP 26ML APPLICATOR (WOUND CARE) ×2 IMPLANT
ELECT CAUTERY BLADE 6.4 (BLADE) ×3 IMPLANT
ELECT REM PT RETURN 9FT ADLT (ELECTROSURGICAL)
ELECTRODE REM PT RTRN 9FT ADLT (ELECTROSURGICAL) IMPLANT
FACESHIELD WRAPAROUND (MASK) IMPLANT
FACESHIELD WRAPAROUND OR TEAM (MASK) IMPLANT
GAUZE SPONGE 4X4 12PLY STRL (GAUZE/BANDAGES/DRESSINGS) ×4 IMPLANT
GAUZE XEROFORM 1X8 LF (GAUZE/BANDAGES/DRESSINGS) ×2 IMPLANT
GAUZE XEROFORM 5X9 LF (GAUZE/BANDAGES/DRESSINGS) ×1 IMPLANT
GLOVE ECLIPSE 7.5 STRL STRAW (GLOVE) ×2 IMPLANT
GLOVE NEODERM STRL 7.5 LF PF (GLOVE) ×4 IMPLANT
GLOVE SURG NEODERM 7.5  LF PF (GLOVE) ×1
GOWN STRL REIN XL XLG (GOWN DISPOSABLE) ×4 IMPLANT
HANDPIECE INTERPULSE COAX TIP (DISPOSABLE) ×3
KIT BASIN OR (CUSTOM PROCEDURE TRAY) ×2 IMPLANT
KIT ROOM TURNOVER OR (KITS) ×3 IMPLANT
MANIFOLD NEPTUNE II (INSTRUMENTS) ×3 IMPLANT
NS IRRIG 1000ML POUR BTL (IV SOLUTION) ×5 IMPLANT
PACK ORTHO EXTREMITY (CUSTOM PROCEDURE TRAY) ×3 IMPLANT
PAD ABD 8X10 STRL (GAUZE/BANDAGES/DRESSINGS) ×3 IMPLANT
PAD ARMBOARD 7.5X6 YLW CONV (MISCELLANEOUS) ×6 IMPLANT
PADDING CAST ABS 4INX4YD NS (CAST SUPPLIES)
PADDING CAST ABS COTTON 4X4 ST (CAST SUPPLIES) ×4 IMPLANT
PADDING CAST COTTON 6X4 STRL (CAST SUPPLIES) ×2 IMPLANT
SET HNDPC FAN SPRY TIP SCT (DISPOSABLE) ×1 IMPLANT
SPONGE LAP 18X18 X RAY DECT (DISPOSABLE) ×6 IMPLANT
STOCKINETTE IMPERVIOUS 9X36 MD (GAUZE/BANDAGES/DRESSINGS) ×3 IMPLANT
SUT ETHILON 2 0 FS 18 (SUTURE) ×3 IMPLANT
SUT ETHILON 2 0 PSLX (SUTURE) ×1 IMPLANT
SUT ETHILON 3 0 PS 1 (SUTURE) ×2 IMPLANT
SUT VIC AB 2-0 CT1 36 (SUTURE) ×2 IMPLANT
SUT VIC AB 2-0 FS1 27 (SUTURE) ×4 IMPLANT
SYR CONTROL 10ML LL (SYRINGE) IMPLANT
TOWEL OR 17X24 6PK STRL BLUE (TOWEL DISPOSABLE) ×3 IMPLANT
TOWEL OR 17X26 10 PK STRL BLUE (TOWEL DISPOSABLE) ×3 IMPLANT
TUBE ANAEROBIC SPECIMEN COL (MISCELLANEOUS) IMPLANT
TUBE CONNECTING 12X1/4 (SUCTIONS) ×3 IMPLANT
TUBE FEEDING 5FR 15 INCH (TUBING) IMPLANT
TUBING CYSTO DISP (UROLOGICAL SUPPLIES) ×2 IMPLANT
UNDERPAD 30X30 INCONTINENT (UNDERPADS AND DIAPERS) ×6 IMPLANT
WATER STERILE IRR 1000ML POUR (IV SOLUTION) ×2 IMPLANT
YANKAUER SUCT BULB TIP NO VENT (SUCTIONS) ×3 IMPLANT

## 2015-10-17 NOTE — Progress Notes (Signed)
Orthopedic Tech Progress Note Patient Details:  Lucas [name] February 01, 1951 [id number]  Ortho Devices Ortho Device/Splint Location: applied ohf to bed Ortho Device/Splint Interventions: Ordered, Application   Braulio Bosch 10/17/2015, 6:46 PM

## 2015-10-17 NOTE — ED Notes (Addendum)
Received call from Dr. Erlinda Hong- pt will go to OR now for procedure and MD will speak with pt in OR.
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
## 2015-10-17 NOTE — Anesthesia Preprocedure Evaluation (Addendum)
Anesthesia Evaluation  Patient identified by MRN, date of birth, ID band Patient awake    Reviewed: Allergy & Precautions, NPO status , Patient's Chart, lab work & pertinent test results  Airway Mallampati: I  TM Distance: >3 FB Neck ROM: Full    Dental  (+) Teeth Intact, Dental Advisory Given Has 4 teeth left, in good repair, states not loose:   Pulmonary COPD,  COPD inhaler, Current Smoker,    breath sounds clear to auscultation       Cardiovascular hypertension, Pt. on medications  Rhythm:Regular Rate:Normal     Neuro/Psych    GI/Hepatic Currently states nausea and constipation, unable to keep food down past 24 hrs   Endo/Other    Renal/GU      Musculoskeletal   Abdominal   Peds  Hematology   Anesthesia Other Findings   Reproductive/Obstetrics                           Anesthesia Physical Anesthesia Plan  ASA: III  Anesthesia Plan: General   Post-op Pain Management:    Induction: Intravenous  Airway Management Planned: LMA  Additional Equipment:   Intra-op Plan:   Post-operative Plan: Extubation in OR  Informed Consent: I have reviewed the patients History and Physical, chart, labs and discussed the procedure including the risks, benefits and alternatives for the proposed anesthesia with the patient or authorized representative who has indicated his/her understanding and acceptance.   Dental advisory given  Plan Discussed with: CRNA, Anesthesiologist and Surgeon  Anesthesia Plan Comments:         Anesthesia Quick Evaluation

## 2015-10-17 NOTE — ED Notes (Signed)
Encouraged pt to provide urine sample; pt states at this time he does not feel the need to urinate at this time

## 2015-10-17 NOTE — ED Provider Notes (Signed)
CSN: [id number]     Arrival date & time 10/17/15  1034 History   First MD Initiated Contact with Patient 10/17/15 1315     Chief Complaint  Patient presents with  . post op infection   . Cellulitis   HPI   64 year old male presents today with lower leg infection. He was admitted to the hospital on 09/16/2015 for operative treatment of fracture of right tibial plateau. He was taken to the OR by Dr. Ninfa Linden and discharged home at 09/25/2015. He reports since that time he's had home health care, changes wounds. He reports approximately 10 days ago he started to develop infection overlying the hardware and was seen by Dr. Ninfa Linden at that time. He was placed on Bactrim and pain medication at that time. Patient reports since starting the antibiotics infection has continued to progress. Patient denies any fever, chills, nausea, vomiting, or any other signs of systemic illness. He reports pain to the leg about the side of the infection with no significant surrounding redness, erythema.  Past Medical History  Diagnosis Date  . HYPERLIPIDEMIA 12/21/2006  . TOBACCO DEPENDENCE 12/21/2006  . HYPERTENSION, BENIGN SYSTEMIC 12/21/2006  . COPD 12/21/2006  . Environmental allergies    Past Surgical History  Procedure Laterality Date  . Knee surgery      left knee and knee  . Ankle surgery      left ankle  . Wrist surgery      left  . Fasciotomy  01/10/2012    Procedure: FASCIOTOMY;  Surgeon: Rozanna Box, MD;  Location: [place];  Service: Orthopedics;  Laterality: Left;  status post fasciotomy with wound vac left calf; adjustment external fixator left tibia under fluoro; retention suture/wound vac placement left lateral calf wound  . I&d extremity  01/12/2012    Procedure: IRRIGATION AND DEBRIDEMENT EXTREMITY;  Surgeon: Rozanna Box, MD;  Location: [place];  Service: Orthopedics;  Laterality: Left;  LAYERD CLOSURE LEFT LEG WOUND  . External fixation leg  01/06/2012    Procedure: EXTERNAL FIXATION LEG;   Surgeon: Meredith Pel, MD;  Location: [facility];  Service: Orthopedics;  Laterality: Left;  Smith-nephew external fixator set  . Application of wound vac  01/06/2012    Procedure: APPLICATION OF WOUND VAC;  Surgeon: Meredith Pel, MD;  Location[facility];  Service: Orthopedics;  Laterality: Left;  . Fasciotomy  01/06/2012    Procedure: FASCIOTOMY;  Surgeon: Meredith Pel, MD;  Location[facility];  Service: Orthopedics;  Laterality: Left;  Four compartment fasciotomy  . Orif tibia plateau  02/07/2012    Procedure: OPEN REDUCTION INTERNAL FIXATION (ORIF) TIBIAL PLATEAU;  Surgeon: Rozanna Box, MD;  Location: [place];  Service: Orthopedics;  Laterality: Left;  ORIF LEFT TIBIAL PLATEAU/ REMOVE EXTERNAL FIXATION LEFT LEG  . Orif tibia plateau Right 09/22/2015    Procedure: REMOVAL OF EXTERNAL FIXATOR RIGHT LEG, OPEN REDUCTION INTERNAL FIXATION (ORIF) RIGHT TIBIAL PLATEAU, EXCISION SMALL MASS LEFT LEG;  Surgeon: Mcarthur Rossetti, MD;  Location: [place];  Service: Orthopedics;  Laterality: Right;  . External fixation removal Right 09/22/2015    Procedure: REMOVAL EXTERNAL FIXATION LEG;  Surgeon: Mcarthur Rossetti, MD;  Location: [place];  Service: Orthopedics;  Laterality: Right;  . Mass excision Left 09/22/2015    Procedure: EXCISION MASS;  Surgeon: Mcarthur Rossetti, MD;  Location: [place];  Service: Orthopedics;  Laterality: Left;  . External fixation leg Right 09/16/2015    Procedure: EXTERNAL FIXATION LEG/LARGE;  Surgeon: Harrell Gave  Kerry Fort, MD;  Location: [place];  Service: Orthopedics;  Laterality: Right;   History reviewed. No pertinent family history. Social History  Substance Use Topics  . Smoking status: Current Every Day Smoker -- 0.50 packs/day for 50 years    Types: Cigarettes  . Smokeless tobacco: Former Systems developer    Types: [place] date: 01/05/1965     Comment: working on cutting back  . Alcohol Use: 0.0 oz/week    0 Standard drinks or equivalent per week      Comment: 6 pack beer overf 2 days sometimes    Review of Systems  All other systems reviewed and are negative.   Allergies  Ace inhibitors; Ampicillin; Codeine; Lisinopril; and Penicillins  Home Medications   Prior to Admission medications   Medication Sig Start Date End Date Taking? Authorizing Provider  albuterol (VENTOLIN HFA) 108 (90 BASE) MCG/ACT inhaler Inhale 2 puffs into the lungs every 4 (four) hours as needed for wheezing or shortness of breath. 08/12/15  Yes Katheren Shams, DO  aspirin 325 MG tablet Take 1 tablet (325 mg total) by mouth 2 (two) times daily after a meal. 09/25/15  Yes Mcarthur Rossetti, MD  losartan-hydrochlorothiazide (HYZAAR) 50-12.5 MG tablet TAKE 1 TABLET BY MOUTH DAILY. 10/07/15  Yes Katheren Shams, DO  oxycodone (OXY-IR) 5 MG capsule Take 1-2 mg by mouth every 8 (eight) hours as needed for pain.   Yes Historical Provider, MD  simvastatin (ZOCOR) 40 MG tablet TAKE 1 TABLET (40 MG TOTAL) BY MOUTH AT BEDTIME. 10/07/15  Yes Katheren Shams, DO  sulfamethoxazole-trimethoprim (BACTRIM DS,SEPTRA DS) 800-160 MG tablet Take 1 tablet by mouth 2 (two) times daily. 10/09/15 10/19/15 Yes Historical Provider, MD  Tiotropium Bromide Monohydrate (SPIRIVA RESPIMAT) 2.5 MCG/ACT AERS Inhale 2 puffs of inhaler once daily. 08/12/15  Yes Katheren Shams, DO  oxyCODONE-acetaminophen (ROXICET) 5-325 MG tablet Take 1-2 tablets by mouth every 4 (four) hours as needed. Patient not taking: Reported on 10/17/2015 09/25/15   Mcarthur Rossetti, MD  VENTOLIN HFA 108 (90 BASE) MCG/ACT inhaler INHALE 2 PUFFS INTO THE LUNGS EVERY 4 (FOUR) HOURS AS NEEDED FOR WHEEZING OR SHORTNESS OF BREATH. Patient not taking: Reported on 10/17/2015 10/01/15   Katheren Shams, DO   BP 120/73 mmHg  Pulse 89  Temp(Src) 97.8 F (36.6 C) (Oral)  Resp 18  SpO2 98% Physical Exam  Constitutional: He is oriented to person, place, and time. He appears well-developed and well-nourished.  HENT:  Head:  Normocephalic and atraumatic.  Eyes: Conjunctivae are normal. Pupils are equal, round, and reactive to light. Right eye exhibits no discharge. Left eye exhibits no discharge. No scleral icterus.  Neck: Normal range of motion. No JVD present. No tracheal deviation present.  Pulmonary/Chest: Effort normal. No stridor.  Neurological: He is alert and oriented to person, place, and time. Coordination normal.  Skin: Skin is warm and dry. No rash noted. No erythema. No pallor.  Psychiatric: He has a normal mood and affect. His behavior is normal. Judgment and thought content normal.  Nursing note and vitals reviewed.       ED Course  Procedures (including critical care time) Labs Review Labs Reviewed  COMPREHENSIVE METABOLIC PANEL - Abnormal; Notable for the following:    Sodium 134 (*)    Chloride 99 (*)    Glucose, Bld 112 (*)    Albumin 3.3 (*)    Alkaline Phosphatase 127 (*)    All other components within normal  limits  CBC WITH DIFFERENTIAL/PLATELET - Abnormal; Notable for the following:    RBC 3.88 (*)    Hemoglobin 12.0 (*)    HCT 35.9 (*)    All other components within normal limits  I-STAT CG4 LACTIC ACID, ED - Abnormal; Notable for the following:    Lactic Acid, Venous 2.21 (*)    All other components within normal limits  CULTURE, BLOOD (ROUTINE X 2)  CULTURE, BLOOD (ROUTINE X 2)  URINE CULTURE  URINALYSIS, ROUTINE W REFLEX MICROSCOPIC (NOT AT [facility])  I-STAT CG4 LACTIC ACID, ED    Imaging Review No results found. I have personally reviewed and evaluated these images and lab results as part of my medical decision-making.   EKG Interpretation None      MDM   Final diagnoses:  Post op infection    Labs: I-STAT lactic acid, CBC, urinalysis, CMP-lactic acid of 2.21  Imaging: DG tib-fib  Consults:  Therapeutics: No saline  Discharge Meds:   Assessment/Plan: 64 year old male presents with postop infection, he is afebrile, nontoxic, with no elevated white  count. He does have a lactic acid of 2.21. Dr. Erlinda Hong consult that would be taking the patient to the OR today.         Okey Regal, PA-C 10/17/15 [place], MD 10/17/15 1806

## 2015-10-17 NOTE — H&P (Signed)
ORTHOPAEDIC HISTORY AND PHYSICAL   Chief Complaint: Right leg wound dehiscence  HPI: NEVAEH [name] is a 64 y.o. male who complains of right lower leg wound dehiscence.  He is s/p ORIF of a severe tibial plateau on 09/22/15.  He presents today with wound dehiscence.  He's been on bactrim and doing wet to dry dressings changes.  Denies any f/c/n/v.  Past Medical History  Diagnosis Date  . HYPERLIPIDEMIA 12/21/2006  . TOBACCO DEPENDENCE 12/21/2006  . HYPERTENSION, BENIGN SYSTEMIC 12/21/2006  . COPD 12/21/2006  . Environmental allergies    Past Surgical History  Procedure Laterality Date  . Knee surgery      left knee and knee  . Ankle surgery      left ankle  . Wrist surgery      left  . Fasciotomy  01/10/2012    Procedure: FASCIOTOMY;  Surgeon: Rozanna Box, MD;  Location: [place];  Service: Orthopedics;  Laterality: Left;  status post fasciotomy with wound vac left calf; adjustment external fixator left tibia under fluoro; retention suture/wound vac placement left lateral calf wound  . I&d extremity  01/12/2012    Procedure: IRRIGATION AND DEBRIDEMENT EXTREMITY;  Surgeon: Rozanna Box, MD;  Location: [place];  Service: Orthopedics;  Laterality: Left;  LAYERD CLOSURE LEFT LEG WOUND  . External fixation leg  01/06/2012    Procedure: EXTERNAL FIXATION LEG;  Surgeon: Meredith Pel, MD;  [facility];  Service: Orthopedics;  Laterality: Left;  Smith-nephew external fixator set  . Application of wound vac  01/06/2012    Procedure: APPLICATION OF WOUND VAC;  Surgeon: Meredith Pel, MD;  [facility];  Service: Orthopedics;  Laterality: Left;  . Fasciotomy  01/06/2012    Procedure: FASCIOTOMY;  Surgeon: Meredith Pel, MD;  [facility];  Service: Orthopedics;  Laterality: Left;  Four compartment fasciotomy  . Orif tibia plateau  02/07/2012    Procedure: OPEN REDUCTION INTERNAL FIXATION (ORIF) TIBIAL PLATEAU;  Surgeon: Rozanna Box, MD;  Location: [place];  Service:  Orthopedics;  Laterality: Left;  ORIF LEFT TIBIAL PLATEAU/ REMOVE EXTERNAL FIXATION LEFT LEG  . Orif tibia plateau Right 09/22/2015    Procedure: REMOVAL OF EXTERNAL FIXATOR RIGHT LEG, OPEN REDUCTION INTERNAL FIXATION (ORIF) RIGHT TIBIAL PLATEAU, EXCISION SMALL MASS LEFT LEG;  Surgeon: Mcarthur Rossetti, MD;  Location: [place];  Service: Orthopedics;  Laterality: Right;  . External fixation removal Right 09/22/2015    Procedure: REMOVAL EXTERNAL FIXATION LEG;  Surgeon: Mcarthur Rossetti, MD;  Location: [place];  Service: Orthopedics;  Laterality: Right;  . Mass excision Left 09/22/2015    Procedure: EXCISION MASS;  Surgeon: Mcarthur Rossetti, MD;  Location: [place];  Service: Orthopedics;  Laterality: Left;  . External fixation leg Right 09/16/2015    Procedure: EXTERNAL FIXATION LEG/LARGE;  Surgeon: Mcarthur Rossetti, MD;  Location: [place];  Service: Orthopedics;  Laterality: Right;   Social History   Social History  . Marital Status: Married    Spouse Name: N/A  . Number of Children: N/A  . Years of Education: N/A   Social History Main Topics  . Smoking status: Current Every Day Smoker -- 0.50 packs/day for 50 years    Types: Cigarettes  . Smokeless tobacco: Former Systems developer    Types: [place] date: 01/05/1965     Comment: working on cutting back  . Alcohol Use: 0.0 oz/week    0 Standard drinks or equivalent per  week     Comment: 6 pack beer overf 2 days sometimes  . Drug Use: No  . Sexual Activity: Not Asked   Other Topics Concern  . None   Social History Narrative   History reviewed. No pertinent family history. Allergies  Allergen Reactions  . Ace Inhibitors Swelling  . Ampicillin Nausea And Vomiting  . Codeine Nausea And Vomiting  . Lisinopril Swelling    REACTION: angioedema  . Penicillins Nausea And Vomiting   Prior to Admission medications   Medication Sig Start Date End Date Taking? Authorizing Provider  albuterol (VENTOLIN HFA) 108 (90 BASE)  MCG/ACT inhaler Inhale 2 puffs into the lungs every 4 (four) hours as needed for wheezing or shortness of breath. 08/12/15  Yes Katheren Shams, DO  aspirin 325 MG tablet Take 1 tablet (325 mg total) by mouth 2 (two) times daily after a meal. 09/25/15  Yes Mcarthur Rossetti, MD  losartan-hydrochlorothiazide (HYZAAR) 50-12.5 MG tablet TAKE 1 TABLET BY MOUTH DAILY. 10/07/15  Yes Katheren Shams, DO  oxycodone (OXY-IR) 5 MG capsule Take 1-2 mg by mouth every 8 (eight) hours as needed for pain.   Yes Historical Provider, MD  simvastatin (ZOCOR) 40 MG tablet TAKE 1 TABLET (40 MG TOTAL) BY MOUTH AT BEDTIME. 10/07/15  Yes Katheren Shams, DO  sulfamethoxazole-trimethoprim (BACTRIM DS,SEPTRA DS) 800-160 MG tablet Take 1 tablet by mouth 2 (two) times daily. 10/09/15 10/19/15 Yes Historical Provider, MD  Tiotropium Bromide Monohydrate (SPIRIVA RESPIMAT) 2.5 MCG/ACT AERS Inhale 2 puffs of inhaler once daily. 08/12/15  Yes Katheren Shams, DO  oxyCODONE-acetaminophen (ROXICET) 5-325 MG tablet Take 1-2 tablets by mouth every 4 (four) hours as needed. Patient not taking: Reported on 10/17/2015 09/25/15   Mcarthur Rossetti, MD  VENTOLIN HFA 108 (90 BASE) MCG/ACT inhaler INHALE 2 PUFFS INTO THE LUNGS EVERY 4 (FOUR) HOURS AS NEEDED FOR WHEEZING OR SHORTNESS OF BREATH. Patient not taking: Reported on 10/17/2015 10/01/15   Katheren Shams, DO   No results found. - pertinent xrays, CT, MRI studies were reviewed and independently interpreted  Positive ROS: All other systems have been reviewed and were otherwise negative with the exception of those mentioned in the HPI and as above.  Physical Exam: General: Alert, no acute distress Cardiovascular: No pedal edema Respiratory: No cyanosis, no use of accessory musculature GI: No organomegaly, abdomen is soft and non-tender Skin: No lesions in the area of chief complaint Neurologic: Sensation intact distally Psychiatric: Patient is competent for consent with normal  mood and affect Lymphatic: No axillary or cervical lymphadenopathy  MUSCULOSKELETAL:  - wound dehiscence of medial incision - lateral incision is healed - lots of fibrinous exudate in the wound - no frank pus - no cellulitis  Assessment: Right lower leg wound dehiscence  Plan: - NPO - needs formal I&D and VAC - may need repeat I&D - will put on IV abx postop - admit to ortho - consent obtained   N. Eduard Roux, MD [place] 3:14 PM

## 2015-10-17 NOTE — Anesthesia Procedure Notes (Signed)
Procedure Name: Intubation Date/Time: 10/17/2015 4:12 PM Performed by: [name] A Pre-anesthesia Checklist: Patient identified, Timeout performed, Emergency Drugs available, Suction available and Patient being monitored Patient Re-evaluated:Patient Re-evaluated prior to inductionOxygen Delivery Method: Circle system utilized Preoxygenation: Pre-oxygenation with 100% oxygen Intubation Type: IV induction, Rapid sequence and Cricoid Pressure applied Laryngoscope Size: Mac and 3 Grade View: Grade I Tube type: Oral Tube size: 7.5 mm Number of attempts: 1 Airway Equipment and Method: Stylet Placement Confirmation: ETT inserted through vocal cords under direct vision,  positive ETCO2 and breath sounds checked- equal and bilateral Secured at: 21 cm Tube secured with: Tape Dental Injury: Teeth and Oropharynx as per pre-operative assessment

## 2015-10-17 NOTE — ED Provider Notes (Signed)
With open wound along proximal right shin with purulent thick beige discharge. Surrounding skin is red and warm and tender.DP Pulse 2+  Lucas Dakin, MD 10/17/15 1409

## 2015-10-17 NOTE — Transfer of Care (Signed)
Immediate Anesthesia Transfer of Care Note  Patient: Lucas [name]  Procedure(s) Performed: Procedure(s): IRRIGATION AND DEBRIDEMENT EXTREMITY (Right) APPLICATION OF WOUND VAC (Right)  Patient Location: PACU  Anesthesia Type:General  Level of Consciousness: awake  Airway & Oxygen Therapy: Patient Spontanous Breathing and Patient connected to nasal cannula oxygen  Post-op Assessment: Report given to RN and Post -op Vital signs reviewed and stable  Post vital signs: Reviewed and stable  Last Vitals:  Filed Vitals:   10/17/15 1445 10/17/15 1501  BP: 124/72 120/73  Pulse: 92 89  Temp:    Resp:  18    Complications: No apparent anesthesia complications

## 2015-10-17 NOTE — Anesthesia Postprocedure Evaluation (Signed)
Anesthesia Post Note  Patient: Lucas [name]  Procedure(s) Performed: Procedure(s) (LRB): IRRIGATION AND DEBRIDEMENT EXTREMITY (Right) APPLICATION OF WOUND VAC (Right)  Patient location during evaluation: PACU Anesthesia Type: General Level of consciousness: awake and alert Pain management: pain level controlled Vital Signs Assessment: post-procedure vital signs reviewed and stable Respiratory status: spontaneous breathing, nonlabored ventilation and respiratory function stable Cardiovascular status: blood pressure returned to baseline and stable Postop Assessment: no signs of nausea or vomiting Anesthetic complications: no    Last Vitals:  Filed Vitals:   10/17/15 1730 10/17/15 2014  BP: 119/65 122/58  Pulse: 81 92  Temp: 36.5 C 37.2 C  Resp: 17 18    Last Pain:  Filed Vitals:   10/17/15 2015  PainSc: 0-No pain                 Jakelyn Squyres,[place] A

## 2015-10-17 NOTE — Progress Notes (Signed)
ANTIBIOTIC CONSULT NOTE - INITIAL  Pharmacy Consult for Vancomycin Indication: Wound Infection  Allergies  Allergen Reactions  . Ace Inhibitors Swelling  . Ampicillin Nausea And Vomiting  . Codeine Nausea And Vomiting  . Lisinopril Swelling    REACTION: angioedema  . Penicillins Nausea And Vomiting    Patient Measurements: Weight: 66 kg (09/16/15, requested new weight with RN)  Vital Signs: Temp: 97.7 F (36.5 C) (12/24 1730) Temp Source: Oral (12/24 1730) BP: 119/65 mmHg (12/24 1730) Pulse Rate: 81 (12/24 1730) Intake/Output from previous day:   Intake/Output from this shift: Total I/O In: 1900 [I.V.:1900] Out: 40 [Drains:10; Blood:30]  Labs:  Recent Labs  10/17/15 1130  WBC 6.6  HGB 12.0*  PLT 308  CREATININE 1.08   CrCl cannot be calculated (Unknown ideal weight.). No results for input(s): VANCOTROUGH, VANCOPEAK, VANCORANDOM, GENTTROUGH, GENTPEAK, GENTRANDOM, TOBRATROUGH, TOBRAPEAK, TOBRARND, AMIKACINPEAK, AMIKACINTROU, AMIKACIN in the last 72 hours.   Microbiology: Recent Results (from the past 720 hour(s))  Surgical pcr screen     Status: None   Collection Time: 09/21/15  6:30 PM  Result Value Ref Range Status   MRSA, PCR NEGATIVE NEGATIVE Final   Staphylococcus aureus NEGATIVE NEGATIVE Final    Comment:        The Xpert SA Assay (FDA approved for NASAL specimens in patients over 88 years of age), is one component of a comprehensive surveillance program.  Test performance has been validated by [facility] for patients greater than or equal to 3 year old. It is not intended to diagnose infection nor to guide or monitor treatment.     Medical History: Past Medical History  Diagnosis Date  . HYPERLIPIDEMIA 12/21/2006  . TOBACCO DEPENDENCE 12/21/2006  . HYPERTENSION, BENIGN SYSTEMIC 12/21/2006  . COPD 12/21/2006  . Environmental allergies     Medications:  Scheduled:  . aspirin  325 mg Oral BID PC  . losartan-hydrochlorothiazide  1 tablet  Oral Daily  . [START ON 10/18/2015] simvastatin  40 mg Oral q1800   Infusions:  . sodium chloride     PRN: acetaminophen **OR** acetaminophen, albuterol, diphenhydrAMINE, methocarbamol **OR** methocarbamol (ROBAXIN)  IV, metoCLOPramide **OR** metoCLOPramide (REGLAN) injection, morphine injection, ondansetron **OR** ondansetron (ZOFRAN) IV, oxyCODONE Assessment: 64 YOM with RLL surgical dehiscence that is s/p ORIF tibial plateau fracture on 11/29 and presented with wound dehiscence.  Pt was on bactrim DS BID PTA.  Pt underwent irrigation and debridement of RLL surgical wound on 12/24 and wound vac was applied.   SCr 1.08 (baseline ~0.85), CrCl ~64 ml/min. WBC 6.6, Afebrile  Vanc 12/24>>  12/24 Tissue Culture 12/24 AFB culture (soft tissues) 12/24 BC 12/24 Anaerobic tissue culture  Goal of Therapy:  Vancomycin trough level 10-15 mcg/ml  Plan:  Initiate Vancomycin IV Q12H Measure antibiotic drug levels at steady state Follow up culture results, s/sx of worsening infection, changes in renal function   10/17/2015,6:34 PM Bennye Alm, PharmD Pharmacy Resident [phone]

## 2015-10-17 NOTE — ED Notes (Signed)
Pt had ORIF of right tibia after thanksgiving- now states it is infected-- right knee swollen, warm to touch, home health nurse told pt to come in.

## 2015-10-17 NOTE — ED Notes (Signed)
Gave report to OR RN. Pt being transported to OR

## 2015-10-17 NOTE — ED Notes (Signed)
Pt also c/o constipation from pain meds from surgery- "no BM in over a week"

## 2015-10-18 LAB — URINALYSIS, ROUTINE W REFLEX MICROSCOPIC
BILIRUBIN URINE: NEGATIVE
GLUCOSE, UA: NEGATIVE mg/dL
HGB URINE DIPSTICK: NEGATIVE
Ketones, ur: NEGATIVE mg/dL
Leukocytes, UA: NEGATIVE
Nitrite: NEGATIVE
PROTEIN: NEGATIVE mg/dL
Specific Gravity, Urine: 1.017 (ref 1.005–1.030)
pH: 6.5 (ref 5.0–8.0)

## 2015-10-18 LAB — BASIC METABOLIC PANEL
ANION GAP: 7 (ref 5–15)
BUN: 8 mg/dL (ref 6–20)
CHLORIDE: 105 mmol/L (ref 101–111)
CO2: 24 mmol/L (ref 22–32)
Calcium: 9.1 mg/dL (ref 8.9–10.3)
Creatinine, Ser: 0.93 mg/dL (ref 0.61–1.24)
GFR calc Af Amer: >60 mL/min (ref >60)
GLUCOSE: 109 mg/dL — AB (ref 65–99)
POTASSIUM: 3.8 mmol/L (ref 3.5–5.1)
Sodium: 136 mmol/L (ref 135–145)

## 2015-10-18 NOTE — Progress Notes (Signed)
ANTIBIOTIC CONSULT NOTE - INITIAL  Pharmacy Consult for Vancomycin Indication: Wound Infection  Allergies  Allergen Reactions  . Ace Inhibitors Swelling  . Ampicillin Nausea And Vomiting  . Codeine Nausea And Vomiting  . Lisinopril Swelling    REACTION: angioedema  . Penicillins Nausea And Vomiting    Patient Measurements: Weight: 66 kg (09/16/15, requested new weight with RN)  Vital Signs: Temp: 98.2 F (36.8 C) (12/25 0529) Temp Source: Oral (12/25 0529) BP: 140/75 mmHg (12/25 0529) Pulse Rate: 84 (12/25 0529) Intake/Output from previous day: 12/24 0701 - 12/25 0700 In: 2340 [P.O.:240; I.V.:1900; IV Piggyback:200] Out: 890 [Urine:730; Drains:130; Blood:30] Intake/Output from this shift:    Labs:  Recent Labs  10/17/15 1130 10/18/15 0301  WBC 6.6  --   HGB 12.0*  --   PLT 308  --   CREATININE 1.08 0.93   CrCl cannot be calculated (Unknown ideal weight.). No results for input(s): VANCOTROUGH, VANCOPEAK, VANCORANDOM, GENTTROUGH, GENTPEAK, GENTRANDOM, TOBRATROUGH, TOBRAPEAK, TOBRARND, AMIKACINPEAK, AMIKACINTROU, AMIKACIN in the last 72 hours.   Microbiology: Recent Results (from the past 720 hour(s))  Surgical pcr screen     Status: None   Collection Time: 09/21/15  6:30 PM  Result Value Ref Range Status   MRSA, PCR NEGATIVE NEGATIVE Final   Staphylococcus aureus NEGATIVE NEGATIVE Final    Comment:        The Xpert SA Assay (FDA approved for NASAL specimens in patients over 65 years of age), is one component of a comprehensive surveillance program.  Test performance has been validated by [facility] for patients greater than or equal to 65 year old. It is not intended to diagnose infection nor to guide or monitor treatment.     Medical History: Past Medical History  Diagnosis Date  . HYPERLIPIDEMIA 65/28/2008  . TOBACCO DEPENDENCE 12/21/2006  . HYPERTENSION, BENIGN SYSTEMIC 12/21/2006  . COPD 12/21/2006  . Environmental allergies     Medications:   Scheduled:  . aspirin  325 mg Oral BID PC  . losartan  50 mg Oral Daily   And  . hydrochlorothiazide  12.5 mg Oral Daily  . simvastatin  40 mg Oral q1800  . vancomycin  750 mg Intravenous Q12H   Infusions:  . sodium chloride 125 mL/hr (10/17/15 2056)   PRN: acetaminophen **OR** acetaminophen, albuterol, diphenhydrAMINE, methocarbamol **OR** methocarbamol (ROBAXIN)  IV, metoCLOPramide **OR** metoCLOPramide (REGLAN) injection, morphine injection, ondansetron **OR** ondansetron (ZOFRAN) IV, oxyCODONE   Assessment: 64 YOM with RLL surgical dehiscence that is s/p ORIF tibial plateau fracture on 11/29 and presented with wound dehiscence.  Pt was on bactrim DS BID PTA.   Vancomycin for right leg surgical wound infection. S/P I/D on 12/24. Vanc x1g in the ED. [Pt reported weight: 145-147 lbs]   SCr 1.08>0.93 (baseline ~0.85), CrCl ~70 ml/min. WBC 6.6 on 12/24, Afebrile   Vanc 12/24>>  12/24 Tissue Culture>>sent  12/24 AFB culture (soft tissues)>>sent  12/24 BC>>sent  12/24 Anaerobic tissue culture>>sent  Goal of Therapy:  Vancomycin trough level 10-15 mcg/ml  Plan:  Continue Vancomycin '750mg'$  IV Q12H Measure antibiotic drug levels at steady state Follow up culture results, s/sx of worsening infection, changes in renal function   Rion Catala C. Lennox Grumbles, PharmD Pharmacy Resident  Pager: [phone] 10/18/2015 11:03 AM

## 2015-10-18 NOTE — Progress Notes (Signed)
PT Cancellation Note  Patient Details Name: Lucas [name] MRN: [id number] DOB: December 25, 1950   Cancelled Treatment:    Reason Eval/Treat Not Completed: Patient declined, no reason specified Declines working with therapy today. States he has been in pain all morning and does not wish to make it worse. Encouraged to get OOB with therapy as tolerated but requests we return tomorrow. Discussed importance of mobility while admitted. Will follow up for comprehensive evaluation tomorrow.  Ellouise Newer 10/18/2015, 10:33 AM Elayne Snare, [place]

## 2015-10-18 NOTE — Progress Notes (Signed)
   Subjective:  Patient reports pain as mild.    Objective:   VITALS:   Filed Vitals:   10/17/15 1715 10/17/15 1730 10/17/15 2014 10/18/15 0529  BP: 114/67 119/65 122/58 140/75  Pulse: 77 81 92 84  Temp: 98.2 F (36.8 C) 97.7 F (36.5 C) 98.9 F (37.2 C) 98.2 F (36.8 C)  TempSrc:  Oral Oral Oral  Resp: '17 17 18 18  '$ SpO2: 100% 100% 99% 98%    Neurologically intact Neurovascular intact Sensation intact distally Intact pulses distally Dorsiflexion/Plantar flexion intact Incision: dressing C/D/I and no drainage No cellulitis present Compartment soft   Lab Results  Component Value Date   WBC 6.6 10/17/2015   HGB 12.0* 10/17/2015   HCT 35.9* 10/17/2015   MCV 92.5 10/17/2015   PLT 308 10/17/2015     Assessment/Plan:  1 Day Post-Op   - Expected postop acute blood loss anemia - will monitor for symptoms - Up with PT/OT - DVT ppx - SCDs, ambulation, asa - NWB operative extremity - Pain control - Dr. Ninfa Linden to see patient about definitive treatment - continue VAC  Marianna Payment 10/18/2015, 7:11 AM [phone]

## 2015-10-18 NOTE — Progress Notes (Signed)
OT Cancellation Note  Patient Details Name: Lucas [name] MRN: [id number] DOB: [birth year]   Cancelled Treatment:    Reason Eval/Treat Not Completed: Patient declined, no reason specified (Will continue to follow.)  Malka So 10/18/2015, 10:30 AM

## 2015-10-19 LAB — BASIC METABOLIC PANEL
ANION GAP: 7 (ref 5–15)
BUN: 7 mg/dL (ref 6–20)
CO2: 25 mmol/L (ref 22–32)
Calcium: 9.3 mg/dL (ref 8.9–10.3)
Chloride: 108 mmol/L (ref 101–111)
Creatinine, Ser: 0.75 mg/dL (ref 0.61–1.24)
GFR calc Af Amer: >60 mL/min (ref >60)
GFR calc non Af Amer: >60 mL/min (ref >60)
GLUCOSE: 101 mg/dL — AB (ref 65–99)
POTASSIUM: 3.8 mmol/L (ref 3.5–5.1)
Sodium: 140 mmol/L (ref 135–145)

## 2015-10-19 LAB — URINE CULTURE: CULTURE: NO GROWTH

## 2015-10-19 NOTE — Progress Notes (Signed)
PT Cancellation Note  Patient Details Name: Lucas [name] MRN: [id number] DOB: [birth year]   Cancelled Treatment:    Reason Eval/Treat Not Completed: Patient politely declined, had just gotten back to bed;   Will follow up for PT eval tomorrow, or postop Wed;  Roney Marion, [place] Pager [phone] Office [phone]    Roney Marion [facility] 10/19/2015, 3:51 PM
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
## 2015-10-19 NOTE — Progress Notes (Signed)
Patient ID: Lucas [name], male   DOB: 07-23-1951, 64 y.o.   MRN: [id number] I spoke with Mr. Norcia about his situation.  He will most likely need some type of muscle flap and skin graft due to the extent of his right leg wound and exposed bone/hardware.  Will likely consult Plastic Surgery here for their input.  Will put him on the OR schedule for late tomorrow afternoon for at minimum a repeat I&D.  He seems to understand this fully.

## 2015-10-20 ENCOUNTER — Encounter (HOSPITAL_COMMUNITY)
Admission: EM | Disposition: A | Discharge status: Home Health | Payer: Medicare HMO | Source: Home / Self Care | Attending: Orthopaedic Surgery

## 2015-10-20 ENCOUNTER — Inpatient Hospital Stay (HOSPITAL_COMMUNITY): Payer: Medicare HMO | Admitting: Certified Registered Nurse Anesthetist

## 2015-10-20 ENCOUNTER — Encounter (HOSPITAL_COMMUNITY): Payer: Self-pay | Admitting: Orthopaedic Surgery

## 2015-10-20 HISTORY — PX: I & D EXTREMITY: SHX5045

## 2015-10-20 LAB — BASIC METABOLIC PANEL
ANION GAP: 8 (ref 5–15)
BUN: <5 mg/dL — ABNORMAL LOW (ref 6–20)
CALCIUM: 9.6 mg/dL (ref 8.9–10.3)
CO2: 24 mmol/L (ref 22–32)
CREATININE: 0.77 mg/dL (ref 0.61–1.24)
Chloride: 109 mmol/L (ref 101–111)
GFR calc non Af Amer: >60 mL/min (ref >60)
Glucose, Bld: 118 mg/dL — ABNORMAL HIGH (ref 65–99)
Potassium: 3.8 mmol/L (ref 3.5–5.1)
SODIUM: 141 mmol/L (ref 135–145)

## 2015-10-20 LAB — SURGICAL PCR SCREEN
MRSA, PCR: NEGATIVE
STAPHYLOCOCCUS AUREUS: NEGATIVE

## 2015-10-20 SURGERY — IRRIGATION AND DEBRIDEMENT EXTREMITY
Anesthesia: General | Site: Leg Lower | Laterality: Right

## 2015-10-20 MED ORDER — FENTANYL CITRATE (PF) 250 MCG/5ML IJ SOLN
INTRAMUSCULAR | Status: AC
  Filled 2015-10-20: qty 5

## 2015-10-20 MED ORDER — LIDOCAINE HCL (CARDIAC) 20 MG/ML IV SOLN
INTRAVENOUS | Status: DC | PRN
  Administered 2015-10-20: 60 mg via INTRAVENOUS

## 2015-10-20 MED ORDER — LACTATED RINGERS IV SOLN: INTRAVENOUS | Status: DC | PRN

## 2015-10-20 MED ORDER — SODIUM CHLORIDE 0.9 % IR SOLN
Status: DC | PRN
  Administered 2015-10-20: 3000 mL

## 2015-10-20 MED ORDER — PROPOFOL 10 MG/ML IV BOLUS
INTRAVENOUS | Status: AC
  Filled 2015-10-20: qty 20

## 2015-10-20 MED ORDER — PHENYLEPHRINE 40 MCG/ML (10ML) SYRINGE FOR IV PUSH (FOR BLOOD PRESSURE SUPPORT)
PREFILLED_SYRINGE | INTRAVENOUS | Status: AC
  Filled 2015-10-20: qty 10

## 2015-10-20 MED ORDER — PROPOFOL 10 MG/ML IV BOLUS
INTRAVENOUS | Status: DC | PRN
  Administered 2015-10-20: 150 mg via INTRAVENOUS

## 2015-10-20 MED ORDER — ONDANSETRON HCL 4 MG/2ML IJ SOLN: 4.0000 mg | Freq: Four times a day (QID) | INTRAMUSCULAR | Status: DC | PRN

## 2015-10-20 MED ORDER — ONDANSETRON HCL 4 MG/2ML IJ SOLN
INTRAMUSCULAR | Status: AC
  Filled 2015-10-20: qty 2

## 2015-10-20 MED ORDER — FENTANYL CITRATE (PF) 100 MCG/2ML IJ SOLN
INTRAMUSCULAR | Status: DC | PRN
  Administered 2015-10-20: 25 ug via INTRAVENOUS
  Administered 2015-10-20: 75 ug via INTRAVENOUS

## 2015-10-20 MED ORDER — HYDROMORPHONE HCL 1 MG/ML IJ SOLN: 0.2500 mg | INTRAMUSCULAR | Status: DC | PRN

## 2015-10-20 MED ORDER — ARTIFICIAL TEARS OP OINT
TOPICAL_OINTMENT | OPHTHALMIC | Status: AC
  Filled 2015-10-20: qty 3.5

## 2015-10-20 MED ORDER — 0.9 % SODIUM CHLORIDE (POUR BTL) OPTIME
TOPICAL | Status: DC | PRN
  Administered 2015-10-20: 1000 mL

## 2015-10-20 MED ORDER — OXYCODONE HCL 5 MG PO TABS: 5.0000 mg | ORAL_TABLET | Freq: Once | ORAL | Status: DC | PRN

## 2015-10-20 MED ORDER — ONDANSETRON HCL 4 MG/2ML IJ SOLN
INTRAMUSCULAR | Status: DC | PRN
  Administered 2015-10-20: 4 mg via INTRAVENOUS

## 2015-10-20 MED ORDER — OXYCODONE HCL 5 MG/5ML PO SOLN: 5.0000 mg | Freq: Once | ORAL | Status: DC | PRN

## 2015-10-20 MED ORDER — LIDOCAINE HCL (CARDIAC) 20 MG/ML IV SOLN
INTRAVENOUS | Status: AC
  Filled 2015-10-20: qty 5

## 2015-10-20 SURGICAL SUPPLY — 59 items
BANDAGE ACE 6X5 VEL STRL LF (GAUZE/BANDAGES/DRESSINGS) ×2 IMPLANT
BANDAGE ELASTIC 3 VELCRO ST LF (GAUZE/BANDAGES/DRESSINGS) IMPLANT
BLADE SURG 10 STRL SS (BLADE) ×1 IMPLANT
BNDG COHESIVE 1X5 TAN STRL LF (GAUZE/BANDAGES/DRESSINGS) IMPLANT
BNDG COHESIVE 4X5 TAN STRL (GAUZE/BANDAGES/DRESSINGS) ×1 IMPLANT
BNDG COHESIVE 6X5 TAN STRL LF (GAUZE/BANDAGES/DRESSINGS) ×3 IMPLANT
BNDG CONFORM 3 STRL LF (GAUZE/BANDAGES/DRESSINGS) IMPLANT
BNDG GAUZE STRTCH 6 (GAUZE/BANDAGES/DRESSINGS) ×3 IMPLANT
CANISTER WOUND CARE 500ML ATS (WOUND CARE) ×1 IMPLANT
CORDS BIPOLAR (ELECTRODE) IMPLANT
COVER SURGICAL LIGHT HANDLE (MISCELLANEOUS) ×2 IMPLANT
CUFF TOURNIQUET SINGLE 18IN (TOURNIQUET CUFF) ×1 IMPLANT
CUFF TOURNIQUET SINGLE 24IN (TOURNIQUET CUFF) IMPLANT
CUFF TOURNIQUET SINGLE 34IN LL (TOURNIQUET CUFF) IMPLANT
CUFF TOURNIQUET SINGLE 44IN (TOURNIQUET CUFF) IMPLANT
DRAPE INCISE IOBAN 66X45 STRL (DRAPES) ×1 IMPLANT
DRAPE ORTHO SPLIT 77X108 STRL (DRAPES) ×2
DRAPE SURG 17X23 STRL (DRAPES) IMPLANT
DRAPE SURG ORHT 6 SPLT 77X108 (DRAPES) ×2 IMPLANT
DRAPE U-SHAPE 47X51 STRL (DRAPES) ×2 IMPLANT
DRSG ADAPTIC 3X8 NADH LF (GAUZE/BANDAGES/DRESSINGS) ×1 IMPLANT
DRSG VAC ATS MED SENSATRAC (GAUZE/BANDAGES/DRESSINGS) ×1 IMPLANT
DURAPREP 26ML APPLICATOR (WOUND CARE) ×1 IMPLANT
ELECT CAUTERY BLADE 6.4 (BLADE) IMPLANT
ELECT REM PT RETURN 9FT ADLT (ELECTROSURGICAL) ×2
ELECTRODE REM PT RTRN 9FT ADLT (ELECTROSURGICAL) IMPLANT
GAUZE SPONGE 4X4 12PLY STRL (GAUZE/BANDAGES/DRESSINGS) ×1 IMPLANT
GAUZE XEROFORM 1X8 LF (GAUZE/BANDAGES/DRESSINGS) IMPLANT
GLOVE BIO SURGEON STRL SZ8 (GLOVE) ×2 IMPLANT
GLOVE BIOGEL PI IND STRL 8 (GLOVE) ×2 IMPLANT
GLOVE BIOGEL PI INDICATOR 8 (GLOVE) ×2
GLOVE ORTHO TXT STRL SZ7.5 (GLOVE) ×2 IMPLANT
GOWN STRL REUS W/ TWL LRG LVL3 (GOWN DISPOSABLE) ×1 IMPLANT
GOWN STRL REUS W/ TWL XL LVL3 (GOWN DISPOSABLE) ×4 IMPLANT
GOWN STRL REUS W/TWL LRG LVL3 (GOWN DISPOSABLE) ×2
GOWN STRL REUS W/TWL XL LVL3 (GOWN DISPOSABLE) ×4
HANDPIECE INTERPULSE COAX TIP (DISPOSABLE) ×2
KIT BASIN OR (CUSTOM PROCEDURE TRAY) ×2 IMPLANT
KIT ROOM TURNOVER OR (KITS) ×2 IMPLANT
MANIFOLD NEPTUNE II (INSTRUMENTS) ×2 IMPLANT
NS IRRIG 1000ML POUR BTL (IV SOLUTION) ×2 IMPLANT
PACK ORTHO EXTREMITY (CUSTOM PROCEDURE TRAY) ×2 IMPLANT
PAD ARMBOARD 7.5X6 YLW CONV (MISCELLANEOUS) ×3 IMPLANT
PADDING CAST ABS 4INX4YD NS (CAST SUPPLIES)
PADDING CAST ABS COTTON 4X4 ST (CAST SUPPLIES) ×2 IMPLANT
PADDING CAST COTTON 6X4 STRL (CAST SUPPLIES) ×1 IMPLANT
SET HNDPC FAN SPRY TIP SCT (DISPOSABLE) IMPLANT
SPONGE LAP 18X18 X RAY DECT (DISPOSABLE) IMPLANT
STOCKINETTE IMPERVIOUS 9X36 MD (GAUZE/BANDAGES/DRESSINGS) ×2 IMPLANT
SUT ETHILON 2 0 FS 18 (SUTURE) IMPLANT
SUT ETHILON 3 0 PS 1 (SUTURE) ×2 IMPLANT
SYR CONTROL 10ML LL (SYRINGE) IMPLANT
TOWEL OR 17X24 6PK STRL BLUE (TOWEL DISPOSABLE) ×2 IMPLANT
TOWEL OR 17X26 10 PK STRL BLUE (TOWEL DISPOSABLE) ×2 IMPLANT
TUBE ANAEROBIC SPECIMEN COL (MISCELLANEOUS) IMPLANT
TUBE CONNECTING 12X1/4 (SUCTIONS) ×2 IMPLANT
TUBE FEEDING 5FR 15 INCH (TUBING) IMPLANT
UNDERPAD 30X30 INCONTINENT (UNDERPADS AND DIAPERS) ×2 IMPLANT
YANKAUER SUCT BULB TIP NO VENT (SUCTIONS) ×2 IMPLANT
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
## 2015-10-20 NOTE — Brief Op Note (Signed)
10/17/2015 - 10/20/2015  4:31 PM  PATIENT:  Lucas [name]  64 y.o. male  PRE-OPERATIVE DIAGNOSIS:  right leg wound  POST-OPERATIVE DIAGNOSIS:  right leg wound  PROCEDURE:  Procedure(s): REPEAT IRRIGATION AND DEBRIDEMENT EXTREMITY, right lower leg (Right)  SURGEON:  Surgeon(s) and Role:    * Mcarthur Rossetti, MD - Primary  PHYSICIAN ASSISTANT: Benita Stabile, PA-C  ANESTHESIA:   general  EBL:  Total I/O In: 0  Out: 250 [Urine:250]   DICTATION: .Other Dictation: Dictation Number [id number]  PLAN OF CARE: Admit to inpatient   PATIENT DISPOSITION:  PACU - hemodynamically stable.   Delay start of Pharmacological VTE agent (>24hrs) due to surgical blood loss or risk of bleeding: no

## 2015-10-20 NOTE — Anesthesia Procedure Notes (Signed)
Procedure Name: LMA Insertion Date/Time: 10/20/2015 3:56 PM Performed by: Trixie Deis A Pre-anesthesia Checklist: Patient identified, Emergency Drugs available, Suction available, Patient being monitored and Timeout performed Patient Re-evaluated:Patient Re-evaluated prior to inductionOxygen Delivery Method: Circle system utilized Preoxygenation: Pre-oxygenation with 100% oxygen Intubation Type: IV induction Ventilation: Mask ventilation without difficulty LMA: LMA inserted LMA Size: 4.0 Number of attempts: 1 Placement Confirmation: positive ETCO2 and breath sounds checked- equal and bilateral Tube secured with: Tape Dental Injury: Teeth and Oropharynx as per pre-operative assessment

## 2015-10-20 NOTE — Transfer of Care (Signed)
Immediate Anesthesia Transfer of Care Note  Patient: Lucas [name]  Procedure(s) Performed: Procedure(s): REPEAT IRRIGATION AND DEBRIDEMENT EXTREMITY, right lower leg (Right)  Patient Location: PACU  Anesthesia Type:General  Level of Consciousness: awake, alert  and oriented  Airway & Oxygen Therapy: Patient Spontanous Breathing and Patient connected to nasal cannula oxygen  Post-op Assessment: Report given to RN, Post -op Vital signs reviewed and stable and Patient moving all extremities  Post vital signs: Reviewed and stable  Last Vitals:  Filed Vitals:   10/20/15 0445 10/20/15 1326  BP: 141/83 135/80  Pulse: 76 72  Temp: 36.6 C 36.6 C  Resp: 16 18    Complications: No apparent anesthesia complications
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
## 2015-10-20 NOTE — Care Management Important Message (Signed)
Important Message  Patient Details  Name: Lucas [name] MRN: [id number] Date of Birth: [birth year]   Medicare Important Message Given:  Yes    [name] May 10/20/2015, 12:50 PM

## 2015-10-20 NOTE — Anesthesia Preprocedure Evaluation (Signed)
Anesthesia Evaluation  Patient identified by MRN, date of birth, ID band Patient awake    Reviewed: Allergy & Precautions, NPO status , Patient's Chart, lab work & pertinent test results  Airway Mallampati: II   Neck ROM: full    Dental   Pulmonary COPD, Current Smoker,    breath sounds clear to auscultation       Cardiovascular hypertension,  Rhythm:regular Rate:Normal     Neuro/Psych  Neuromuscular disease    GI/Hepatic   Endo/Other    Renal/GU      Musculoskeletal   Abdominal   Peds  Hematology   Anesthesia Other Findings   Reproductive/Obstetrics                             Anesthesia Physical Anesthesia Plan  ASA: II  Anesthesia Plan: General   Post-op Pain Management:    Induction: Intravenous  Airway Management Planned: LMA  Additional Equipment:   Intra-op Plan:   Post-operative Plan:   Informed Consent: I have reviewed the patients History and Physical, chart, labs and discussed the procedure including the risks, benefits and alternatives for the proposed anesthesia with the patient or authorized representative who has indicated his/her understanding and acceptance.     Plan Discussed with: CRNA, Anesthesiologist and Surgeon  Anesthesia Plan Comments:         Anesthesia Quick Evaluation

## 2015-10-21 ENCOUNTER — Encounter (HOSPITAL_COMMUNITY): Payer: Self-pay | Admitting: Orthopaedic Surgery

## 2015-10-21 LAB — TISSUE CULTURE

## 2015-10-21 NOTE — Progress Notes (Signed)
   Spoke with patient this am regarding open wound, exposed bone and hardware, need for coverage. Reviewed incisions, drains, skin graft donor site, post op limitations, use of VAC for at least 5 days, and will need WC with elevated leg rest when OOB and for home eventually. Family has contact information for myself. Plan OR for gastrocnemius flap and STSG on 12.30.16.  Irene Limbo, MD [facility] Plastic & Reconstructive Surgery [phone]

## 2015-10-21 NOTE — Progress Notes (Signed)
Patient ID: Lucas [name], male   DOB: 10/07/1951, 64 y.o.   MRN: [id number] No acute changes.  Repeat I&D of his right leg wound showed good granulation tissue, but exposed hardware.  Plastic Surgery did see him as an intra-operative consult and plan to take him to the OR this Friday for a gastroc flap.  VAC with a good seal.
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
## 2015-10-21 NOTE — Progress Notes (Signed)
OT Cancellation Note  Patient Details Name: Lucas [name] MRN: [id number] DOB: 08/13/1951   Cancelled Treatment:    Reason Eval/Treat Not Completed: Pain limiting ability to participate;Patient declined, no reason specified. Pt reports that he walked out in the hall with PT this morning and "will not do any more today". Despite max verbal encouragement pt continuing to decline participation in therapy due to pain. Will follow up as time allows.  Binnie Kand M.S., OTR/L Pager: [phone]  10/21/2015, 1:55 PM

## 2015-10-22 LAB — CULTURE, BLOOD (ROUTINE X 2)
CULTURE: NO GROWTH
Culture: NO GROWTH

## 2015-10-22 MED ORDER — SODIUM CHLORIDE 0.9 % IV SOLN: INTRAVENOUS | Status: DC

## 2015-10-22 MED ORDER — CEFAZOLIN SODIUM-DEXTROSE 2-3 GM-% IV SOLR
2.0000 g | Freq: Three times a day (TID) | INTRAVENOUS | Status: DC
  Administered 2015-10-22 – 2015-10-28 (×18): 2 g via INTRAVENOUS
  Filled 2015-10-22 (×25): qty 50

## 2015-10-22 NOTE — Progress Notes (Signed)
Patient ID: Lucas [name], male   DOB: 1951-01-11, 64 y.o.   MRN: [id number] He understand that he will be on the OR schedule for tomorrow for a possible right gastoc flap.  He will then need to be in the hospital for at least 5 days post-op with a knee immobilizer and a VAC.

## 2015-10-22 NOTE — Progress Notes (Signed)
   Pt to OR in am. NPO p MN and IVF p MN. Culture with S. Aureus pan sensitive- may consider changing Vancomycin to lesser antibiotic.  Irene Limbo, MD [facility] Plastic & Reconstructive Surgery [phone]

## 2015-10-22 NOTE — Anesthesia Postprocedure Evaluation (Signed)
Anesthesia Post Note  Patient: Lucas [name]  Procedure(s) Performed: Procedure(s) (LRB): REPEAT IRRIGATION AND DEBRIDEMENT EXTREMITY, right lower leg (Right)  Patient location during evaluation: PACU Anesthesia Type: General Level of consciousness: awake and alert and patient cooperative Pain management: pain level controlled Vital Signs Assessment: post-procedure vital signs reviewed and stable Respiratory status: spontaneous breathing and respiratory function stable Cardiovascular status: stable Anesthetic complications: no    Last Vitals:  Filed Vitals:   10/21/15 2018 10/22/15 0425  BP: 140/67 105/72  Pulse: 83 82  Temp: 36.7 C 36.7 C  Resp: 18 16    Last Pain:  Filed Vitals:   10/22/15 0425  PainSc: [name]
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
## 2015-10-23 ENCOUNTER — Inpatient Hospital Stay (HOSPITAL_COMMUNITY): Payer: Medicare HMO | Admitting: Anesthesiology

## 2015-10-23 ENCOUNTER — Encounter (HOSPITAL_COMMUNITY)
Admission: EM | Disposition: A | Discharge status: Home Health | Payer: Self-pay | Source: Home / Self Care | Attending: Orthopaedic Surgery

## 2015-10-23 ENCOUNTER — Encounter (HOSPITAL_COMMUNITY): Payer: Self-pay | Admitting: Certified Registered Nurse Anesthetist

## 2015-10-23 HISTORY — PX: SKIN SPLIT GRAFT: SHX444

## 2015-10-23 LAB — ANAEROBIC CULTURE

## 2015-10-23 SURGERY — APPLICATION, GRAFT, SKIN, SPLIT-THICKNESS
Anesthesia: General | Site: Leg Lower | Laterality: Right

## 2015-10-23 MED ORDER — HYDROMORPHONE HCL 1 MG/ML IJ SOLN: 0.2500 mg | INTRAMUSCULAR | Status: DC | PRN

## 2015-10-23 MED ORDER — MIDAZOLAM HCL 5 MG/5ML IJ SOLN
INTRAMUSCULAR | Status: DC | PRN
  Administered 2015-10-23 (×2): 1 mg via INTRAVENOUS

## 2015-10-23 MED ORDER — LIDOCAINE-EPINEPHRINE 1 %-1:100000 IJ SOLN
INTRAMUSCULAR | Status: AC
  Filled 2015-10-23: qty 1

## 2015-10-23 MED ORDER — FENTANYL CITRATE (PF) 250 MCG/5ML IJ SOLN
INTRAMUSCULAR | Status: AC
  Filled 2015-10-23: qty 5

## 2015-10-23 MED ORDER — MINERAL OIL LIGHT 100 % EX OIL
TOPICAL_OIL | CUTANEOUS | Status: AC
  Filled 2015-10-23: qty 25

## 2015-10-23 MED ORDER — LIDOCAINE HCL (CARDIAC) 20 MG/ML IV SOLN
INTRAVENOUS | Status: DC | PRN
  Administered 2015-10-23: 80 mg via INTRAVENOUS

## 2015-10-23 MED ORDER — OXYCODONE HCL 5 MG/5ML PO SOLN: 5.0000 mg | Freq: Once | ORAL | Status: DC | PRN

## 2015-10-23 MED ORDER — ROCURONIUM BROMIDE 50 MG/5ML IV SOLN
INTRAVENOUS | Status: AC
  Filled 2015-10-23: qty 1

## 2015-10-23 MED ORDER — BUPIVACAINE-EPINEPHRINE (PF) 0.25% -1:200000 IJ SOLN
INTRAMUSCULAR | Status: AC
  Filled 2015-10-23: qty 30

## 2015-10-23 MED ORDER — ARTIFICIAL TEARS OP OINT
TOPICAL_OINTMENT | OPHTHALMIC | Status: DC | PRN
  Administered 2015-10-23: 1 via OPHTHALMIC

## 2015-10-23 MED ORDER — OXYCODONE HCL 5 MG PO TABS: 5.0000 mg | ORAL_TABLET | Freq: Once | ORAL | Status: DC | PRN

## 2015-10-23 MED ORDER — HEPARIN SODIUM (PORCINE) 5000 UNIT/ML IJ SOLN
5000.0000 [IU] | Freq: Two times a day (BID) | INTRAMUSCULAR | Status: DC
  Administered 2015-10-23 – 2015-10-24 (×2): 5000 [IU] via SUBCUTANEOUS
  Filled 2015-10-23 (×7): qty 1

## 2015-10-23 MED ORDER — ONDANSETRON HCL 4 MG/2ML IJ SOLN
INTRAMUSCULAR | Status: DC | PRN
  Administered 2015-10-23: 4 mg via INTRAVENOUS

## 2015-10-23 MED ORDER — KCL IN DEXTROSE-NACL 20-5-0.45 MEQ/L-%-% IV SOLN
INTRAVENOUS | Status: DC
  Administered 2015-10-23 – 2015-10-24 (×2): via INTRAVENOUS
  Filled 2015-10-23 (×2): qty 1000

## 2015-10-23 MED ORDER — 0.9 % SODIUM CHLORIDE (POUR BTL) OPTIME
TOPICAL | Status: DC | PRN
  Administered 2015-10-23: 1000 mL

## 2015-10-23 MED ORDER — PROPOFOL 10 MG/ML IV BOLUS
INTRAVENOUS | Status: DC | PRN
  Administered 2015-10-23: 106 mg via INTRAVENOUS

## 2015-10-23 MED ORDER — FENTANYL CITRATE (PF) 100 MCG/2ML IJ SOLN
INTRAMUSCULAR | Status: DC | PRN
  Administered 2015-10-23 (×5): 50 ug via INTRAVENOUS

## 2015-10-23 MED ORDER — MINERAL OIL LIGHT 100 % EX OIL
TOPICAL_OIL | CUTANEOUS | Status: DC | PRN
  Administered 2015-10-23: 1 via TOPICAL

## 2015-10-23 MED ORDER — ONDANSETRON HCL 4 MG/2ML IJ SOLN
INTRAMUSCULAR | Status: AC
  Filled 2015-10-23: qty 2

## 2015-10-23 MED ORDER — SUCCINYLCHOLINE CHLORIDE 20 MG/ML IJ SOLN
INTRAMUSCULAR | Status: DC | PRN
  Administered 2015-10-23: 60 mg via INTRAVENOUS

## 2015-10-23 MED ORDER — LIDOCAINE HCL (CARDIAC) 20 MG/ML IV SOLN
INTRAVENOUS | Status: AC
  Filled 2015-10-23: qty 5

## 2015-10-23 MED ORDER — EPHEDRINE SULFATE 50 MG/ML IJ SOLN
INTRAMUSCULAR | Status: DC | PRN
  Administered 2015-10-23 (×4): 5 mg via INTRAVENOUS

## 2015-10-23 MED ORDER — MEPERIDINE HCL 25 MG/ML IJ SOLN: 6.2500 mg | INTRAMUSCULAR | Status: DC | PRN

## 2015-10-23 MED ORDER — ARTIFICIAL TEARS OP OINT
TOPICAL_OINTMENT | OPHTHALMIC | Status: AC
  Filled 2015-10-23: qty 3.5

## 2015-10-23 MED ORDER — MIDAZOLAM HCL 2 MG/2ML IJ SOLN
INTRAMUSCULAR | Status: AC
Start: 2015-10-23 — End: 2015-10-23
  Filled 2015-10-23: qty 2

## 2015-10-23 MED ORDER — ADULT MULTIVITAMIN W/MINERALS CH
1.0000 | ORAL_TABLET | Freq: Every day | ORAL | Status: DC
  Administered 2015-10-24 – 2015-10-28 (×5): 1 via ORAL
  Filled 2015-10-23 (×5): qty 1

## 2015-10-23 MED ORDER — ENSURE ENLIVE PO LIQD
237.0000 mL | Freq: Three times a day (TID) | ORAL | Status: DC
  Administered 2015-10-23 – 2015-10-28 (×9): 237 mL via ORAL

## 2015-10-23 MED ORDER — LACTATED RINGERS IV SOLN
INTRAVENOUS | Status: DC
  Administered 2015-10-23 (×2): via INTRAVENOUS

## 2015-10-23 MED ORDER — PROPOFOL 10 MG/ML IV BOLUS
INTRAVENOUS | Status: AC
  Filled 2015-10-23: qty 40

## 2015-10-23 MED ORDER — LIDOCAINE-EPINEPHRINE (PF) 1 %-1:200000 IJ SOLN
INTRAMUSCULAR | Status: AC
  Filled 2015-10-23: qty 30

## 2015-10-23 SURGICAL SUPPLY — 59 items
BANDAGE ACE 4X5 VEL STRL LF (GAUZE/BANDAGES/DRESSINGS) ×1 IMPLANT
BANDAGE ELASTIC 4 VELCRO ST LF (GAUZE/BANDAGES/DRESSINGS) IMPLANT
BANDAGE ELASTIC 6 VELCRO ST LF (GAUZE/BANDAGES/DRESSINGS) IMPLANT
BLADE DERMATOME II (BLADE) ×1 IMPLANT
BLADE SURG ROTATE 9660 (MISCELLANEOUS) IMPLANT
BNDG GAUZE ELAST 4 BULKY (GAUZE/BANDAGES/DRESSINGS) ×1 IMPLANT
CANISTER SUCTION 2500CC (MISCELLANEOUS) IMPLANT
CANISTER WOUND CARE 500ML ATS (WOUND CARE) ×1 IMPLANT
CONT SPEC STER OR (MISCELLANEOUS) ×1 IMPLANT
COVER SURGICAL LIGHT HANDLE (MISCELLANEOUS) ×2 IMPLANT
DERMACARRIERS GRAFT 1 TO 1.5 (DISPOSABLE) ×2
DRAPE INCISE IOBAN 66X45 STRL (DRAPES) IMPLANT
DRAPE ORTHO SPLIT 77X108 STRL (DRAPES)
DRAPE PROXIMA HALF (DRAPES) ×2 IMPLANT
DRAPE SURG ORHT 6 SPLT 77X108 (DRAPES) ×2 IMPLANT
DRSG ADAPTIC 3X8 NADH LF (GAUZE/BANDAGES/DRESSINGS) ×3 IMPLANT
DRSG PAD ABDOMINAL 8X10 ST (GAUZE/BANDAGES/DRESSINGS) ×4 IMPLANT
DRSG TELFA 3X8 NADH (GAUZE/BANDAGES/DRESSINGS) ×2 IMPLANT
DRSG VAC ATS MED SENSATRAC (GAUZE/BANDAGES/DRESSINGS) ×1 IMPLANT
DRSG VAC ATS SM SENSATRAC (GAUZE/BANDAGES/DRESSINGS) IMPLANT
ELECT CAUTERY BLADE 6.4 (BLADE) ×1 IMPLANT
ELECT COATED BLADE 2.86 ST (ELECTRODE) ×1 IMPLANT
ELECT REM PT RETURN 9FT ADLT (ELECTROSURGICAL)
ELECTRODE REM PT RTRN 9FT ADLT (ELECTROSURGICAL) IMPLANT
GAUZE SPONGE 4X4 12PLY STRL (GAUZE/BANDAGES/DRESSINGS) ×1 IMPLANT
GEL ULTRASOUND 20GR AQUASONIC (MISCELLANEOUS) ×2 IMPLANT
GLOVE BIO SURGEON STRL SZ 6 (GLOVE) ×2 IMPLANT
GLOVE BIO SURGEON STRL SZ7 (GLOVE) ×2 IMPLANT
GLOVE BIOGEL PI IND STRL 7.0 (GLOVE) ×1 IMPLANT
GLOVE BIOGEL PI INDICATOR 7.0 (GLOVE) ×1
GOWN STRL REUS W/ TWL LRG LVL3 (GOWN DISPOSABLE) ×2 IMPLANT
GOWN STRL REUS W/TWL LRG LVL3 (GOWN DISPOSABLE) ×4
GRAFT DERMACARRIERS 1 TO 1.5 (DISPOSABLE) ×1 IMPLANT
IMMOBILIZER KNEE 20 (SOFTGOODS) ×2
IMMOBILIZER KNEE 20 THIGH 36 (SOFTGOODS) IMPLANT
KIT BASIN OR (CUSTOM PROCEDURE TRAY) ×2 IMPLANT
KIT ROOM TURNOVER OR (KITS) ×2 IMPLANT
MATRIX SURGICAL PSM 7X10CM (Tissue) ×2 IMPLANT
NS IRRIG 1000ML POUR BTL (IV SOLUTION) ×2 IMPLANT
PACK GENERAL/GYN (CUSTOM PROCEDURE TRAY) ×2 IMPLANT
PAD ABD 8X10 STRL (GAUZE/BANDAGES/DRESSINGS) ×1 IMPLANT
PAD ARMBOARD 7.5X6 YLW CONV (MISCELLANEOUS) ×4 IMPLANT
PADDING CAST COTTON 6X4 STRL (CAST SUPPLIES) IMPLANT
PIN SAFETY STERILE (MISCELLANEOUS) ×1 IMPLANT
SPONGE GAUZE 4X4 12PLY STER LF (GAUZE/BANDAGES/DRESSINGS) ×1 IMPLANT
STAPLER VISISTAT 35W (STAPLE) ×2 IMPLANT
SUT CHROMIC 4 0 PS 2 18 (SUTURE) ×4 IMPLANT
SUT ETHILON 2 0 FS 18 (SUTURE) ×1 IMPLANT
SUT VIC AB 3-0 SH 27 (SUTURE) ×4
SUT VIC AB 3-0 SH 27XBRD (SUTURE) ×2 IMPLANT
SUT VIC AB 4-0 PS2 27 (SUTURE) ×4 IMPLANT
SUT VIC AB 5-0 P-3 18XBRD (SUTURE) IMPLANT
SUT VIC AB 5-0 P3 18 (SUTURE)
SYR CONTROL 10ML LL (SYRINGE) ×2 IMPLANT
TAPE CLOTH SURG 6X10 WHT LF (GAUZE/BANDAGES/DRESSINGS) ×1 IMPLANT
TOWEL OR 17X24 6PK STRL BLUE (TOWEL DISPOSABLE) ×2 IMPLANT
TOWEL OR 17X26 10 PK STRL BLUE (TOWEL DISPOSABLE) ×2 IMPLANT
TUBE CONNECTING 12X1/4 (SUCTIONS) ×1 IMPLANT
UNDERPAD 30X30 INCONTINENT (UNDERPADS AND DIAPERS) ×2 IMPLANT

## 2015-10-23 NOTE — Brief Op Note (Signed)
10/17/2015 - 10/23/2015  11:41 AM  PATIENT:  Lucas [name]  64 y.o. male  PRE-OPERATIVE DIAGNOSIS:  OPEN FRACTURE RIGHT TIBIAL  POST-OPERATIVE DIAGNOSIS:  OPEN FRACTURE RIGHT TIBIAL  PROCEDURE:  Procedure(s): RIGHT GASTROC FLAP SPLIT THICKNESS SKIN GRAFT FROM RIGHT THING TO RIGHT LEG (Right)  SURGEON:  Surgeon(s) and Role:    * Irene Limbo, MD - Primary  PHYSICIAN ASSISTANT:   ASSISTANTS: none   ANESTHESIA:   general  EBL:  Total I/O In: 1400 [I.V.:1400] Out: 350 [Urine:300; Blood:50]  BLOOD ADMINISTERED:none  DRAINS: (15 Fr) Jackson-Pratt drain(s) with closed bulb suction in the calf, beneath flap   LOCAL MEDICATIONS USED:  NONE  SPECIMEN:  Source of Specimen:  tibia bone  DISPOSITION OF SPECIMEN:  lab for culture  COUNTS:  YES  TOURNIQUET:  * No tourniquets in log *  DICTATION: .Note written in EPIC  PLAN OF CARE: Admit to inpatient   PATIENT DISPOSITION:  PACU - hemodynamically stable.   Delay start of Pharmacological VTE agent (>24hrs) due to surgical blood loss or risk of bleeding: no

## 2015-10-23 NOTE — Anesthesia Procedure Notes (Signed)
Procedure Name: Intubation Date/Time: 10/23/2015 9:13 AM Performed by: Willeen Cass P Pre-anesthesia Checklist: Timeout performed, Patient identified, Suction available, Emergency Drugs available and Patient being monitored Patient Re-evaluated:Patient Re-evaluated prior to inductionOxygen Delivery Method: Circle system utilized Preoxygenation: Pre-oxygenation with 100% oxygen Intubation Type: IV induction Ventilation: Mask ventilation without difficulty and Oral airway inserted - appropriate to patient size Laryngoscope Size: Mac and 4 Grade View: Grade I Tube type: Oral Tube size: 7.0 mm Number of attempts: 1 Airway Equipment and Method: Stylet Placement Confirmation: ETT inserted through vocal cords under direct vision,  breath sounds checked- equal and bilateral and positive ETCO2 Secured at: 22 cm Tube secured with: Tape Dental Injury: Teeth and Oropharynx as per pre-operative assessment

## 2015-10-23 NOTE — Interval H&P Note (Signed)
History and Physical Interval Note:  10/23/2015 6:54 AM  [name]  has presented today for surgery, with the diagnosis of OPEN FRACTURE RIGHT TIBIAL  The various methods of treatment have been discussed with the patient and family. After consideration of risks, benefits and other options for treatment, the patient has consented to  Procedure(s): RIGHT GASTROC FLAP SPLIT THICKNESS SKIN GRAFT FROM RIGHT THING TO RIGHT LEG (Right) as a surgical intervention .  The patient's history has been reviewed, patient examined, no change in status, stable for surgery.  I have reviewed the patient's chart and labs.  Questions were answered to the patient's satisfaction.     Lucas [name]

## 2015-10-23 NOTE — Anesthesia Postprocedure Evaluation (Signed)
Anesthesia Post Note  Patient: Lucas [name]  Procedure(s) Performed: Procedure(s) (LRB): RIGHT GASTROC FLAP SPLIT THICKNESS SKIN GRAFT FROM RIGHT THING TO RIGHT LEG (Right)  Patient location during evaluation: PACU Anesthesia Type: General Level of consciousness: awake and alert Pain management: pain level controlled Vital Signs Assessment: post-procedure vital signs reviewed and stable Respiratory status: spontaneous breathing and respiratory function stable Cardiovascular status: blood pressure returned to baseline and stable Postop Assessment: no signs of nausea or vomiting Anesthetic complications: no    Last Vitals:  Filed Vitals:   10/23/15 1145 10/23/15 1230  BP:  148/76  Pulse: 72 78  Temp:  36.8 C  Resp: 11 14    Last Pain:  Filed Vitals:   10/23/15 1426  PainSc: 10-Worst pain ever                 Jlon Betker S

## 2015-10-23 NOTE — Care Management Important Message (Signed)
Important Message  Patient Details  Name: Lucas [name] MRN: [id number] Date of Birth: 1951/10/19   Medicare Important Message Given:  Yes    [name] P Hosmer 10/23/2015, 2:27 PM

## 2015-10-23 NOTE — Transfer of Care (Signed)
Immediate Anesthesia Transfer of Care Note  Patient: Lucas [name]  Procedure(s) Performed: Procedure(s): RIGHT GASTROC FLAP SPLIT THICKNESS SKIN GRAFT FROM RIGHT THING TO RIGHT LEG (Right)  Patient Location: PACU  Anesthesia Type:General  Level of Consciousness: awake, alert , oriented and patient cooperative  Airway & Oxygen Therapy: Patient Spontanous Breathing and Patient connected to nasal cannula oxygen  Post-op Assessment: Report given to RN and Post -op Vital signs reviewed and stable  Post vital signs: Reviewed and stable  Last Vitals:  HR 86 SpO2 98% on 2L [place] RR 13 BP 152/76 Pt resting comfortably, denies pain, maintains good airway.  Complications: No apparent anesthesia complications

## 2015-10-23 NOTE — Progress Notes (Signed)
Utilization review completed.  
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
## 2015-10-23 NOTE — Progress Notes (Signed)
PT Cancellation Note  Patient Details Name: Lucas [name] MRN: [id number] DOB: 27-Sep-1951   Cancelled Treatment:    Reason Eval/Treat Not Completed: Patient at procedure or test/unavailable.  Pt is in the OR.  PT will re-assess tomorrow.  Thanks,    Barbarann Ehlers. Britanee Vanblarcom, [place], DPT [phone]   10/23/2015, 10:40 AM

## 2015-10-23 NOTE — Anesthesia Preprocedure Evaluation (Signed)
Anesthesia Evaluation  Patient identified by MRN, date of birth, ID band Patient awake    Reviewed: Allergy & Precautions, NPO status , Patient's Chart, lab work & pertinent test results  Airway Mallampati: I  TM Distance: >3 FB Neck ROM: Full    Dental  (+) Poor Dentition, Dental Advisory Given   Pulmonary COPD,  COPD inhaler, Current Smoker,    breath sounds clear to auscultation       Cardiovascular hypertension, Pt. on medications  Rhythm:Regular Rate:Normal     Neuro/Psych    GI/Hepatic   Endo/Other    Renal/GU      Musculoskeletal   Abdominal   Peds  Hematology   Anesthesia Other Findings   Reproductive/Obstetrics                             Anesthesia Physical Anesthesia Plan  ASA: II  Anesthesia Plan: General   Post-op Pain Management:    Induction: Intravenous  Airway Management Planned: LMA and Oral ETT  Additional Equipment:   Intra-op Plan:   Post-operative Plan: Extubation in OR  Informed Consent: I have reviewed the patients History and Physical, chart, labs and discussed the procedure including the risks, benefits and alternatives for the proposed anesthesia with the patient or authorized representative who has indicated his/her understanding and acceptance.   Dental advisory given  Plan Discussed with: CRNA, Anesthesiologist and Surgeon  Anesthesia Plan Comments:         Anesthesia Quick Evaluation
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
## 2015-10-23 NOTE — Op Note (Addendum)
Operative Note   DATE OF OPERATION: 12.30.2016  LOCATION: [facility]- inpatient  SURGICAL DIVISION: Plastic Surgery  PREOPERATIVE DIAGNOSES:  1. Open tibia fracture right 2. Exposed hardware right tibia  POSTOPERATIVE DIAGNOSES:  same  PROCEDURE:  1. Right medial gastrocnemius flap 2. Split thickness skin graft to right leg total 70 cm2 3. Application A Cell matrix to right thigh donor site 70 cm2.  SURGEON: Irene Limbo MD MBA  ASSISTANT: none  ANESTHESIA:  General.   EBL: 50 ml  COMPLICATIONS: None immediate.   INDICATIONS FOR PROCEDURE:  The patient, Lucas [name], is a 65 y.o. male born on July 15, 1951, is here for coverage open wound right leg with exposure right tibia and hardware. Patient suffered closed fracture 1 month ago and course complicated by infection and debridement with resultant open wound.   FINDINGS: Exposure tibia and hardware, open wound 9 x 7 x 3 cm.   DESCRIPTION OF PROCEDURE:  The patient's operative site was marked with the patient in the preoperative area. The patient was taken to the operating room. SCD was placed over left leg. Patient has been on IV antibiotics and no additional antibiotics were given. The patient's operative site was prepped and draped in a sterile fashion. A time out was performed and all information was confirmed to be correct. Wound treated by curettage and skin margins excised to fresh tissue. Wound irrigated thoroughly. Using fresh instruments, tibia bone specimen sent for culture. Incision then made in midline posterior calf. Sural nerve and lesser saphenous vein identified and preserved. Subcutaneous dissection completed over surface of gastrocnemius muscle medially until reaching open wound over anterior surface. The muscle was then divided in midline. Medial head of muscle elevated with cuff of Achilles tendon and submuscular dissection completed toward knee. Muscle then rotated through subcutaneous tunnel to open wound. The  muscular fascia was incised to allow spreading of muscle. Calf donor site irrigated and 15 Fr drain placed and secured with 2-0 nylon. Closure completed with 3-0 vicryl to approximate superficial fascia and dermis. Skin closure completed with staples.  15 Fr drain placed in sub- flap position and secured to skin with 2-0 nylon. Muscle flap inset to wound with interrupted 4-0 vicryl suture in mattress fashion. Split thickness skin graft harvested from right thigh at thickness 12/1000th inch and inset over muscle flap with 4-0 chromic. Adpatic and wound VAC applied over skin graft and flap and set to 125 mm Hg continuous. A Cell matrix prepared 7 x 10 cm and perforated and placed over donor site. Adaptic, dry dressing and Ace wrap applied to right thigh. Telfa and dry dressing applied to posterior calf incision. Knee immobilizer applied.   The patient was allowed to wake from anesthesia, extubated and taken to the recovery room in satisfactory condition.   SPECIMENS: tibia bone for culture  DRAINS: 15 Fr JP in calf donor site, 15 Fr JP in submuscular flap position, wound VAC over skin graft and flap  Irene Limbo, MD [facility] Plastic & Reconstructive Surgery [phone]

## 2015-10-23 NOTE — Progress Notes (Signed)
Received call from [place] at [facility] that patient was active with [facility] prior to admission for [facility] and [place] visits. Will continue to follow for d/c needs.

## 2015-10-24 LAB — BASIC METABOLIC PANEL
Anion gap: 9 (ref 5–15)
BUN: 6 mg/dL (ref 6–20)
CHLORIDE: 101 mmol/L (ref 101–111)
CO2: 28 mmol/L (ref 22–32)
Calcium: 9.2 mg/dL (ref 8.9–10.3)
Creatinine, Ser: 0.74 mg/dL (ref 0.61–1.24)
GFR calc Af Amer: >60 mL/min (ref >60)
GFR calc non Af Amer: >60 mL/min (ref >60)
GLUCOSE: 108 mg/dL — AB (ref 65–99)
POTASSIUM: 3.6 mmol/L (ref 3.5–5.1)
SODIUM: 138 mmol/L (ref 135–145)

## 2015-10-24 LAB — CBC WITH DIFFERENTIAL/PLATELET
Basophils Absolute: 0 10*3/uL (ref 0.0–0.1)
Basophils Relative: 0 %
Eosinophils Absolute: 0.2 10*3/uL (ref 0.0–0.7)
Eosinophils Relative: 3 %
HEMATOCRIT: 27.7 % — AB (ref 39.0–52.0)
HEMOGLOBIN: 9.3 g/dL — AB (ref 13.0–17.0)
LYMPHS ABS: 1.3 10*3/uL (ref 0.7–4.0)
LYMPHS PCT: 20 %
MCH: 30.9 pg (ref 26.0–34.0)
MCHC: 33.6 g/dL (ref 30.0–36.0)
MCV: 92 fL (ref 78.0–100.0)
Monocytes Absolute: 0.7 10*3/uL (ref 0.1–1.0)
Monocytes Relative: 10 %
NEUTROS ABS: 4.3 10*3/uL (ref 1.7–7.7)
NEUTROS PCT: 67 %
Platelets: 297 10*3/uL (ref 150–400)
RBC: 3.01 MIL/uL — AB (ref 4.22–5.81)
RDW: 13.5 % (ref 11.5–15.5)
WBC: 6.5 10*3/uL (ref 4.0–10.5)

## 2015-10-24 LAB — C-REACTIVE PROTEIN: CRP: 3.2 mg/dL — ABNORMAL HIGH (ref <1.0)

## 2015-10-24 LAB — SEDIMENTATION RATE: Sed Rate: 67 mm/hr — ABNORMAL HIGH (ref 0–16)

## 2015-10-24 MED ORDER — SENNOSIDES-DOCUSATE SODIUM 8.6-50 MG PO TABS: 1.0000 | ORAL_TABLET | Freq: Every evening | ORAL | Status: DC | PRN

## 2015-10-24 MED ORDER — BISACODYL 10 MG RE SUPP
10.0000 mg | Freq: Once | RECTAL | Status: AC
  Administered 2015-10-24: 10 mg via RECTAL
  Filled 2015-10-24: qty 1

## 2015-10-24 NOTE — Progress Notes (Signed)
Subjective: 1 Day Post-Op Procedure(s) (LRB): RIGHT GASTROC FLAP SPLIT THICKNESS SKIN GRAFT FROM RIGHT THING TO RIGHT LEG (Right) Patient reports pain as mild.  Patient complaining of constipation.   Objective: Vital signs in last 24 hours: Temp:  [97.9 F (36.6 C)-98.8 F (37.1 C)] 98.8 F (37.1 C) (12/31 0409) Pulse Rate:  [72-96] 96 (12/31 0409) Resp:  [11-16] 16 (12/31 0409) BP: (118-152)/(62-76) 118/62 mmHg (12/31 0409) SpO2:  [97 %-98 %] 97 % (12/31 0409)  Intake/Output from previous day: 12/30 0701 - 12/31 0700 In: 2225 [I.V.:2225] Out: 1255 [Urine:1050; Drains:155; Blood:50] Intake/Output this shift: Total I/O In: 480 [P.O.:480] Out: 450 [Urine:450]   Recent Labs  10/24/15 0607  HGB 9.3*    Recent Labs  10/24/15 0607  WBC 6.5  RBC 3.01*  HCT 27.7*  PLT 297    Recent Labs  10/24/15 0607  NA 138  K 3.6  CL 101  CO2 28  BUN 6  CREATININE 0.74  GLUCOSE 108*  CALCIUM 9.2   No results for input(s): LABPT, INR in the last 72 hours.  Dorsiflexion/Plantar flexion intact Compartment soft Dressings clean dry and intact  Jp drains charged  Assessment/Plan: 1 Day Post-Op Procedure(s) (LRB): RIGHT GASTROC FLAP SPLIT THICKNESS SKIN GRAFT FROM RIGHT THING TO RIGHT LEG (Right) Knee immobilizer on at all times right leg Dressing changes per plastics Laxative   CLARK, GILBERT 10/24/2015, 9:03 AM

## 2015-10-24 NOTE — Progress Notes (Signed)
  POD #1 right medial gastrocnemius flap, STSG  Temp:  [97.9 F (36.6 C)-98.8 F (37.1 C)] 98.4 F (36.9 C) (12/31 0908) Pulse Rate:  [72-96] 86 (12/31 0908) Resp:  [11-17] 17 (12/31 0908) BP: (115-152)/(59-76) 115/59 mmHg (12/31 0908) SpO2:  [97 %-99 %] 99 % (12/31 0908)   JP 1 85 2 35 ml  PE JPs serosanguinous Expected edema leg without cellulitis Thigh dressing dry intact VAC intact with watery drainage Posterior calf dressing dry   A/P Plan VAC over STSG and flap for 5 days, no bending knee F/u intraop cultures- continue Ancef for prior culture S. Aureus for now. May be OOB with assist and not knee bending on right, NWB on right. Will remove posterior calf dressing tomorrow.  Bowel regimen.  Irene Limbo, MD [facility] Plastic & Reconstructive Surgery [phone]

## 2015-10-24 NOTE — Progress Notes (Signed)
PT Cancellation Note  Patient Details Name: Lucas [name] MRN: [id number] DOB: 1951/05/06   Cancelled Treatment:    Reason Eval/Treat Not Completed: Pain limiting ability to participate (patient declined PT today stating he was in too much pain).  Offered to come back later and pt reported he was not getting up today with PT (nor tomorrow).  Will attempt PT again tomorrow.   Shanna Cisco 10/24/2015, 12:28 PM

## 2015-10-25 NOTE — Progress Notes (Signed)
Patient ID: Lucas [name], male   DOB: 1951-04-25, 65 y.o.   MRN: [id number] No acute changes.  Drains in and VAC to suction with a good seal.  Will be non-weight bearing still on his right leg for 4-6 more weeks.

## 2015-10-25 NOTE — Progress Notes (Signed)
  POD #2 right medial gastrocnemius flap, STSG  Temp:  [98 F (36.7 C)-98.8 F (37.1 C)] 98.6 F (37 C) (01/01 0620) Pulse Rate:  [77-90] 82 (01/01 0620) Resp:  [16] 16 (01/01 0620) BP: (103-123)/(57-68) 123/68 mmHg (01/01 0620) SpO2:  [98 %-100 %] 98 % (01/01 0620)   JP 1 31m  2 35 ml VAC 65 ml  PE JPs serosanguinous edema leg without cellulitis Thigh dressing dry intact- c/o dressing cutting into proximal thigh and the Ace was cut here VAC intact with watery drainage Posterior calf dressing removed and incision intact, serosanguinous drainage present  A/P  Plan VAC over STSG and flap for 5 days, no bending knee F/u intraop cultures- continue Ancef for prior culture S. Aureus for now. No GTD bone May be OOB with assist and not knee bending on right, NWB on right- patient refused PT. Reviewed he will not be able to return home if cannot use crutches as no ramp at his home.  Encouraged patient needs to get out of bed to chair at least to prevent pressure sores  BIrene Limbo MD [facility] [phone]

## 2015-10-25 NOTE — Progress Notes (Signed)
PT Cancellation Note  Patient Details Name: Lucas [name] MRN: [id number] DOB: 11/18/1950   Cancelled Treatment:    Reason Eval/Treat Not Completed: Other (comment) (pt received lunch tray and denied therapy at this time stating "come back tomorrow")   Melford Aase 10/25/2015, 12:30 PM Elwyn Reach, [place]

## 2015-10-26 ENCOUNTER — Encounter (HOSPITAL_COMMUNITY): Payer: Self-pay | Admitting: General Practice

## 2015-10-26 DIAGNOSIS — A4901 Methicillin susceptible Staphylococcus aureus infection, unspecified site: Secondary | ICD-10-CM | POA: Diagnosis present

## 2015-10-26 DIAGNOSIS — B9561 Methicillin susceptible Staphylococcus aureus infection as the cause of diseases classified elsewhere: Secondary | ICD-10-CM

## 2015-10-26 DIAGNOSIS — T814XXA Infection following a procedure, initial encounter: Principal | ICD-10-CM

## 2015-10-26 DIAGNOSIS — M86161 Other acute osteomyelitis, right tibia and fibula: Secondary | ICD-10-CM

## 2015-10-26 DIAGNOSIS — M869 Osteomyelitis, unspecified: Secondary | ICD-10-CM

## 2015-10-26 LAB — TISSUE CULTURE
Culture: NO GROWTH
GRAM STAIN: NONE SEEN

## 2015-10-26 MED ORDER — RIFAMPIN 300 MG PO CAPS
600.0000 mg | ORAL_CAPSULE | Freq: Every day | ORAL | Status: DC
  Administered 2015-10-26 – 2015-10-28 (×3): 600 mg via ORAL
  Filled 2015-10-26 (×3): qty 2

## 2015-10-26 NOTE — Progress Notes (Signed)
  POD #3 right medial gastrocnemius flap, STSG  Temp:  [98.8 F (37.1 C)-99.1 F (37.3 C)] 99.1 F (37.3 C) (01/02 0649) Pulse Rate:  [81-90] 90 (01/02 0649) Resp:  [16-18] 16 (01/02 0649) BP: (108-140)/(55-70) 140/70 mmHg (01/02 0649) SpO2:  [97 %-99 %] 97 % (01/02 0649)   JP 1 15 ml 2 15 ml VAC no output recorded  Worked with PT today.  PE JPs scant old blood Decreased edema leg without cellulitis Thigh dressing dry intact- c/o dressing cutting into proximal thigh and the Ace was cut here VAC intact  Posterior calf scabs over staples  A/P  Plan VAC over STSG and flap for 5 days, no bending knee  intraop cultures- continue Ancef for prior culture S. Aureus for now. Bone culture negative. Will discuss with ID recommendation for length antibiotics OOB with assist and not knee bending on right, NWB on right D/c JP#1  Irene Limbo, MD [facility] Plastic & Reconstructive Surgery [phone]

## 2015-10-26 NOTE — Progress Notes (Signed)
Pt JP #1 discontinued/removed per order. Delia Heady RN
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
## 2015-10-26 NOTE — Progress Notes (Signed)
Patient reports pain not relieved and requesting IV pain medication for breakthrough pain.
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
## 2015-10-26 NOTE — Progress Notes (Signed)
Spoke with patient about discharge plan, he would like to keep [facility] for his home health. Patient was receiving [place] and HHPT visits prior to admission. Patient stated that MD told him today that he will be receiving PICC and IV antibiotics at home. Explained to patient that [facility] would teach him and or caregiver how to give IV antibiotics or he may be able to receive IV antibiotics in a SNF. He stated that he does not want to go to SNF, he has niece and a friend who can probably help him.I contacted Edwina at [facility] and informed her patient probably going home with IV antibiotics. She stated that she will contact [facility] to provide the home IV antibiotics. Discussed equipment with patient, he has crutches and a walker at home. He does not want a tub bench, states he can access tub well without it. Will continue to follow patient for discharge needs.

## 2015-10-26 NOTE — Care Management Important Message (Signed)
Important Message  Patient Details  Name: Lucas [name] MRN: [id number] Date of Birth: [birth year]   Medicare Important Message Given:  Yes    Louanne Belton 10/26/2015, 12:47 [place] Message  Patient Details  Name: Lucas [name] MRN: [id number] Date of Birth: 1950-11-15   Medicare Important Message Given:  Yes    Brandace Cargle, Neal Dy 10/26/2015, 12:46 PM

## 2015-10-26 NOTE — Progress Notes (Signed)
Patient ID: Lucas [name], male   DOB: 06-Aug-1951, 65 y.o.   MRN: [id number] No acute changes.  Right leg flap being followed by Plastics.  Anticipate discharge late this week, but will continue to need therapy for mobility with non-weight bearing on his right leg in a knee immobilizer and no bending of his knee.
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
## 2015-10-27 ENCOUNTER — Encounter (HOSPITAL_COMMUNITY): Payer: Self-pay | Admitting: Plastic Surgery

## 2015-10-27 NOTE — Progress Notes (Signed)
PT Cancellation Note  Patient Details Name: TSUGIO ELISON MRN: [id number] DOB: [birth year]   Cancelled Treatment:    Reason Eval/Treat Not Completed: Pain limiting ability to participate (pt reports pain 5/10, asking for pain meds and declines therapy at this time)   Melford Aase 10/27/2015, [place], [place]

## 2015-10-27 NOTE — Progress Notes (Signed)
Patient ID: Lucas [name], male   DOB: [birth year], 65 y.o.   MRN: [id number] Awiating PICC line placement.  Certainly appreciate Infectious Disease and Plastic Surgery's input.  No acute changes otherwise.  D/C planning for next 1-2 days hopefully.

## 2015-10-27 NOTE — Progress Notes (Addendum)
  POD #4 right medial gastrocnemius flap, STSG  Temp:  [97.8 F (36.6 C)-98.3 F (36.8 C)] 98.3 F (36.8 C) (01/03 1331) Pulse Rate:  [77-92] 77 (01/03 1331) Resp:  [18] 18 (01/03 1331) BP: (120-124)/(61-73) 120/61 mmHg (01/03 1331) SpO2:  [97 %-100 %] 100 % (01/03 1331)   JP  1 15 ml VAC no output recorded  Declined PT today. Appeciate ID consult, awaiting PICC  PE JP scant old blood Thigh dressing dry intact VAC intact  Posterior calf scabs over staples  A/P  VAC takedown over STSG and flap tomorrow. Thigh donor site dressing takedown at day 7-10 and staple removal at this time as well, assuming flap viable.  intraop cultures bone no growth, initial culture S. Aureus.- plan long term Ancef and rifampin Despite writing order for removal JP "#1," JP #2 was removed yesterday. This error was addressed with charge RN.   Irene Limbo, MD [facility] Plastic & Reconstructive Surgery [phone]

## 2015-10-27 NOTE — Progress Notes (Signed)
Patient ID: Lucas [name], male   DOB: 13-Dec-1950, 65 y.o.   MRN: [id number]         [facility] for Infectious Disease    Date of Admission:  10/17/2015   Total days of antibiotics 11        Day 6 cefazolin        Day 2 rifampin          Principal Problem:   Osteomyelitis due to staphylococcus aureus [facility]) Active Problems:   Fracture of right tibial plateau   Surgical wound dehiscence   Hyperlipidemia   Alcohol abuse   COPD, mild (HCC)   HTN (hypertension)   . aspirin  325 mg Oral BID PC  .  ceFAZolin (ANCEF) IV  2 g Intravenous 3 times per day  . feeding supplement (ENSURE ENLIVE)  237 mL Oral TID BM  . heparin subcutaneous  5,000 Units Subcutaneous Q12H  . losartan  50 mg Oral Daily   And  . hydrochlorothiazide  12.5 mg Oral Daily  . multivitamin with minerals  1 tablet Oral Daily  . rifampin  600 mg Oral Daily  . simvastatin  40 mg Oral q1800    SUBJECTIVE: He states that his pain is under better control.  Review of Systems: Review of Systems  Constitutional: Positive for fever. Negative for chills and diaphoresis.  Musculoskeletal: Positive for joint pain.    Past Medical History  Diagnosis Date  . HYPERLIPIDEMIA 12/21/2006  . TOBACCO DEPENDENCE 12/21/2006  . HYPERTENSION, BENIGN SYSTEMIC 12/21/2006  . COPD 12/21/2006  . Environmental allergies     Social History  Substance Use Topics  . Smoking status: Current Every Day Smoker -- 1.00 packs/day for 51 years    Types: Cigarettes  . Smokeless tobacco: Former Systems developer    Types: Chew, [place] date: 01/05/1965  . Alcohol Use: 27.6 oz/week    0 Standard drinks or equivalent, 46 Cans of beer per week     Comment: 10/26/2015 "i'VE CUT DOWN TO A COUPLE 40s/day"    History reviewed. No pertinent family history. Allergies  Allergen Reactions  . Ace Inhibitors Swelling  . Ampicillin Nausea And Vomiting  . Codeine Nausea And Vomiting  . Lisinopril Swelling    REACTION: angioedema  . Penicillins Nausea  And Vomiting    OBJECTIVE: Filed Vitals:   10/26/15 0649 10/26/15 1438 10/26/15 1958 10/27/15 0628  BP: 140/70 122/73 124/66 122/65  Pulse: 90 85 92 86  Temp: 99.1 F (37.3 C) 97.8 F (36.6 C) 98.1 F (36.7 C) 98.2 F (36.8 C)  TempSrc: Oral Oral Oral Oral  Resp: '16 18 18 18  '$ Height:      Weight:      SpO2: 97% 97% 98% 97%   Body mass index is 20.58 kg/(m^2).  Physical Exam  Constitutional:  He is pleasant and in no distress watching television.  HENT:  Mouth/Throat: No oropharyngeal exudate.  Many missing teeth.  Cardiovascular: Normal rate and regular rhythm.   No murmur heard. Pulmonary/Chest: Breath sounds normal.  Abdominal: Soft. There is no tenderness.  Neurological:  Right lower leg wrapped.    Lab Results Lab Results  Component Value Date   WBC 6.5 10/24/2015   HGB 9.3* 10/24/2015   HCT 27.7* 10/24/2015   MCV 92.0 10/24/2015   PLT 297 10/24/2015    Lab Results  Component Value Date   CREATININE 0.74 10/24/2015   BUN 6 10/24/2015   NA 138 10/24/2015   K  3.6 10/24/2015   CL 101 10/24/2015   CO2 28 10/24/2015    Lab Results  Component Value Date   ALT 17 10/17/2015   AST 31 10/17/2015   ALKPHOS 127* 10/17/2015   BILITOT 0.6 10/17/2015     Microbiology: Recent Results (from the past 240 hour(s))  Anaerobic culture     Status: None   Collection Time: 10/17/15  4:56 PM  Result Value Ref Range Status   Specimen Description TISSUE RIGHT LEG  Final   Special Requests VANCOMYCIN  Final   Gram Stain   Final    ABUNDANT WBC PRESENT, PREDOMINANTLY PMN ABUNDANT GRAM POSITIVE COCCI IN CLUSTERS RARE GRAM POSITIVE RODS Performed at [facility]    Culture   Final    NO ANAEROBES ISOLATED Performed at [facility]    Report Status 10/23/2015 FINAL  Final  AFB culture with smear     Status: None (Preliminary result)   Collection Time: 10/17/15  4:56 PM  Result Value Ref Range Status   Specimen Description TISSUE RIGHT LEG  Final     Special Requests VANCOMYCIN  Final   Acid Fast Smear   Final    NO ACID FAST BACILLI SEEN Performed at [facility]    Culture   Final    CULTURE WILL BE EXAMINED FOR 6 WEEKS BEFORE ISSUING A FINAL REPORT Performed at [facility]    Report Status PENDING  Incomplete  Tissue culture     Status: None   Collection Time: 10/17/15  4:56 PM  Result Value Ref Range Status   Specimen Description TISSUE RIGHT LEG  Final   Special Requests NONE  Final   Gram Stain   Final    MODERATE WBC PRESENT, PREDOMINANTLY PMN MODERATE GRAM POSITIVE COCCI IN CLUSTERS Performed at [facility]    Culture   Final    FEW STAPHYLOCOCCUS AUREUS Note: RIFAMPIN AND GENTAMICIN SHOULD NOT BE USED AS SINGLE DRUGS FOR TREATMENT OF STAPH INFECTIONS. Performed at [facility]    Report Status 10/21/2015 FINAL  Final   Organism ID, Bacteria STAPHYLOCOCCUS AUREUS  Final      Susceptibility   Staphylococcus aureus - MIC*    CLINDAMYCIN <=0.25 SENSITIVE Sensitive     ERYTHROMYCIN <=0.25 SENSITIVE Sensitive     GENTAMICIN <=0.5 SENSITIVE Sensitive     LEVOFLOXACIN <=0.12 SENSITIVE Sensitive     OXACILLIN 0.5 SENSITIVE Sensitive     RIFAMPIN <=0.5 SENSITIVE Sensitive     TRIMETH/SULFA <=10 SENSITIVE Sensitive     VANCOMYCIN 1 SENSITIVE Sensitive     TETRACYCLINE <=1 SENSITIVE Sensitive     MOXIFLOXACIN <=0.25 SENSITIVE Sensitive     * FEW STAPHYLOCOCCUS AUREUS  Urine culture     Status: None   Collection Time: 10/18/15  6:12 AM  Result Value Ref Range Status   Specimen Description URINE, RANDOM  Final   Special Requests NONE  Final   Culture NO GROWTH 1 DAY  Final   Report Status 10/19/2015 FINAL  Final  Surgical pcr screen     Status: None   Collection Time: 10/20/15  1:41 PM  Result Value Ref Range Status   MRSA, PCR NEGATIVE NEGATIVE Final   Staphylococcus aureus NEGATIVE NEGATIVE Final    Comment:        The Xpert SA Assay (FDA approved for NASAL specimens in  patients over 50 years of age), is one component of a comprehensive surveillance program.  Test performance has been  validated by [facility] for patients greater than or equal to 67 year old. It is not intended to diagnose infection nor to guide or monitor treatment.   Tissue culture     Status: None   Collection Time: 10/23/15  9:29 AM  Result Value Ref Range Status   Specimen Description TISSUE RIGHT TIBIA  Final   Special Requests POF ANCEF  Final   Gram Stain   Final    NO WBC SEEN NO SQUAMOUS EPITHELIAL CELLS SEEN NO ORGANISMS SEEN Performed at [facility]    Culture   Final    NO GROWTH 3 DAYS Performed at [facility]    Report Status 10/26/2015 FINAL  Final  Anaerobic culture     Status: None (Preliminary result)   Collection Time: 10/23/15  9:29 AM  Result Value Ref Range Status   Specimen Description TISSUE RIGHT TIBIA  Final   Special Requests POF ANCEF  Final   Gram Stain   Final    NO WBC SEEN NO SQUAMOUS EPITHELIAL CELLS SEEN NO ORGANISMS SEEN Performed at [facility]    Culture   Final    NO ANAEROBES ISOLATED; CULTURE IN PROGRESS FOR 5 DAYS Performed at [facility]    Report Status PENDING  Incomplete     ASSESSMENT: He is tolerating his 2 drug antibiotic regimen for MSSA osteomyelitis and wound infection well. He is waiting PICC placement. Arrangements are being made for home IV antibiotic therapy. I will arrange followup in my clinic.  PLAN: 1. Continue cefazolin and rifampin 2. PICC placement  Michel Bickers, MD [facility] for Infectious [place] [phone] pager   [phone] cell 10/27/2015, 1:25 PM
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
## 2015-10-28 LAB — ANAEROBIC CULTURE: GRAM STAIN: NONE SEEN

## 2015-10-28 MED ORDER — SODIUM CHLORIDE 0.9 % IJ SOLN: 10.0000 mL | INTRAMUSCULAR | Status: DC | PRN

## 2015-10-28 MED ORDER — OXYCODONE-ACETAMINOPHEN 5-325 MG PO TABS: 1.0000 | ORAL_TABLET | ORAL | Status: DC | PRN

## 2015-10-28 MED ORDER — CEFAZOLIN SODIUM-DEXTROSE 2-3 GM-% IV SOLR: 2.0000 g | Freq: Three times a day (TID) | INTRAVENOUS | Status: DC

## 2015-10-28 MED ORDER — RIFAMPIN 300 MG PO CAPS: 600.0000 mg | ORAL_CAPSULE | Freq: Every day | ORAL | Status: DC

## 2015-10-28 NOTE — Progress Notes (Signed)
Peripherally Inserted Central Catheter/Midline Placement  The IV Nurse has discussed with the patient and/or persons authorized to consent for the patient, the purpose of this procedure and the potential benefits and risks involved with this procedure.  The benefits include less needle sticks, lab draws from the catheter and patient may be discharged home with the catheter.  Risks include, but not limited to, infection, bleeding, blood clot (thrombus formation), and puncture of an artery; nerve damage and irregular heat beat.  Alternatives to this procedure were also discussed.  PICC/Midline Placement Documentation        Lucas [name] 10/28/2015, 8:54 AM Consent obtained by Claretha Cooper, RN

## 2015-10-28 NOTE — Progress Notes (Signed)
Patient ID: Lucas [name], male   DOB: [birth year], 65 y.o.   MRN: [id number]         [facility] for Infectious Disease    Date of Admission:  10/21/2015   Total days of antibiotics 12         Mr. Teegarden is improving on therapy for severe MSSA infection of his right leg. He is tolerating IV cefazolin and oral rifampin well. He will need a minimum of 6 weeks of antibiotic therapy through at least 11/27/2015. Currently, Mr. Dahm and his wife are receiving education about home IV antibiotic therapy. I will arrange followup in my clinic later this month.       Michel Bickers, MD [facility] for Infectious [place] [facility] [phone] pager   [phone] cell 10/27/2015, 1:32 PM

## 2015-10-28 NOTE — Discharge Summary (Signed)
Patient ID: KASHTON MCARTOR MRN: [id number] DOB/AGE: Mar 19, 1951 65 y.o.  Admit date: 10/17/2015 Discharge date: 10/28/2015  Admission Diagnoses:  Principal Problem:   Osteomyelitis due to staphylococcus aureus [facility]) Active Problems:   Hyperlipidemia   Alcohol abuse   COPD, mild (HCC)   HTN (hypertension)   Fracture of right tibial plateau   Surgical wound dehiscence   Discharge Diagnoses:  Same  Past Medical History  Diagnosis Date  . HYPERLIPIDEMIA 12/21/2006  . TOBACCO DEPENDENCE 12/21/2006  . HYPERTENSION, BENIGN SYSTEMIC 12/21/2006  . COPD 12/21/2006  . Environmental allergies     Surgeries: Procedure(s): RIGHT GASTROC FLAP SPLIT THICKNESS SKIN GRAFT FROM RIGHT THING TO RIGHT LEG on 10/17/2015 - 10/23/2015   Consultants:    Discharged Condition: Improved  Hospital Course: Lucas [name] is an 65 y.o. male who was admitted 10/17/2015 for operative treatment ofOsteomyelitis due to staphylococcus aureus ([place]). Patient has severe unremitting pain that affects sleep, daily activities, and work/hobbies. After pre-op clearance the patient was taken to the operating room on 10/17/2015 - 10/23/2015 and underwent  Procedure(s): RIGHT GASTROC FLAP SPLIT THICKNESS SKIN GRAFT FROM RIGHT THING TO RIGHT LEG.    Patient was given perioperative antibiotics: Anti-infectives    Start     Dose/Rate Route Frequency Ordered Stop   10/28/15 0000  ceFAZolin (ANCEF) 2-3 GM-% SOLR     2 g 100 mL/hr over 30 Minutes Intravenous Every 8 hours 10/28/15 1245     10/28/15 0000  rifampin (RIFADIN) 300 MG capsule     600 mg Oral Daily 10/28/15 1245     10/26/15 1500  rifampin (RIFADIN) capsule 600 mg     600 mg Oral Daily 10/26/15 1437     10/22/15 1400  ceFAZolin (ANCEF) IVPB 2 g/50 mL premix     2 g 100 mL/hr over 30 Minutes Intravenous 3 times per day 10/22/15 1222     10/17/15 1900  vancomycin (VANCOCIN) IVPB 750 mg/150 ml premix  Status:  Discontinued     750 mg 150 mL/hr over 60 Minutes  Intravenous Every 12 hours 10/17/15 1849 10/22/15 1222   10/17/15 1500  vancomycin (VANCOCIN) IVPB 1000 mg/200 mL premix     1,000 mg 200 mL/hr over 60 Minutes Intravenous  Once 10/17/15 1450 10/17/15 1704       Patient was given sequential compression devices, early ambulation, and chemoprophylaxis to prevent DVT.  Patient benefited maximally from hospital stay and there were no complications.    Recent vital signs: Patient Vitals for the past 24 hrs:  BP Temp Temp src Pulse Resp SpO2  10/28/15 0617 (!) 120/55 mmHg 98 F (36.7 C) Oral 78 18 99 %  10/27/15 2212 122/63 mmHg 98.7 F (37.1 C) Oral 83 18 99 %  10/27/15 1331 120/61 mmHg 98.3 F (36.8 C) Oral 77 18 100 %     Recent laboratory studies: No results for input(s): WBC, HGB, HCT, PLT, NA, K, CL, CO2, BUN, CREATININE, GLUCOSE, INR, CALCIUM in the last 72 hours.  Invalid input(s): PT, 2   Discharge Medications:     Medication List    STOP taking these medications        oxycodone 5 MG capsule  Commonly known as:  OXY-IR      TAKE these medications        albuterol 108 (90 Base) MCG/ACT inhaler  Commonly known as:  VENTOLIN HFA  Inhale 2 puffs into the lungs every 4 (four) hours as needed for wheezing or shortness of  breath.     VENTOLIN HFA 108 (90 Base) MCG/ACT inhaler  Generic drug:  albuterol  INHALE 2 PUFFS INTO THE LUNGS EVERY 4 (FOUR) HOURS AS NEEDED FOR WHEEZING OR SHORTNESS OF BREATH.     aspirin 325 MG tablet  Take 1 tablet (325 mg total) by mouth 2 (two) times daily after a meal.     ceFAZolin 2-3 GM-% Solr  Commonly known as:  ANCEF  Inject 50 mLs (2 g total) into the vein every 8 (eight) hours.     losartan-hydrochlorothiazide 50-12.5 MG tablet  Commonly known as:  HYZAAR  TAKE 1 TABLET BY MOUTH DAILY.     oxyCODONE-acetaminophen 5-325 MG tablet  Commonly known as:  ROXICET  Take 1-2 tablets by mouth every 4 (four) hours as needed.     rifampin 300 MG capsule  Commonly known as:  RIFADIN   Take 2 capsules (600 mg total) by mouth daily.     simvastatin 40 MG tablet  Commonly known as:  ZOCOR  TAKE 1 TABLET (40 MG TOTAL) BY MOUTH AT BEDTIME.     Tiotropium Bromide Monohydrate 2.5 MCG/ACT Aers  Commonly known as:  SPIRIVA RESPIMAT  Inhale 2 puffs of inhaler once daily.      ASK your doctor about these medications        sulfamethoxazole-trimethoprim 800-160 MG tablet  Commonly known as:  BACTRIM DS,SEPTRA DS  Take 1 tablet by mouth 2 (two) times daily.  Ask about: Should I take this medication?        Diagnostic Studies: Dg Tibia/fibula Right  10/17/2015  CLINICAL DATA:  Postop irrigation and debridement of right lower leg. EXAM: RIGHT TIBIA AND FIBULA - 2 VIEW COMPARISON:  Radiographs 09/22/2015. FINDINGS: The skin staples have been removed in the interval. The tibial plates and screws appear unchanged without loosening or displacement. The comminuted intra-articular fracture of the proximal tibia appears unchanged without evidence of interval osseous healing. The fracture of the fibular head also appears unchanged. No bone destruction, soft tissue emphysema or foreign bodies identified. There are extensive vascular calcifications. IMPRESSION: Intact hardware and stable alignment status post ORIF. No radiographic evidence of osteomyelitis or interval fracture healing. Electronically Signed   By: Richardean Sale M.D.   On: 10/17/2015 18:13    Disposition: 06-Home-Health Care Svc      Discharge Instructions    Call MD for:  redness, tenderness, or signs of infection (pain, swelling, bleeding, redness, odor or green/yellow discharge around incision site)    Complete by:  As directed      Call MD for:  severe or increased pain, loss or decreased feeling  in affected limb(s)    Complete by:  As directed      Call MD for:  temperature >100.5    Complete by:  As directed      Discharge instructions    Complete by:  As directed   Sponge bathe & keep right thigh dressing  dry until follow up visit with Dr. Iran Planas.   No bending right knee. No weight bearing right leg. When traveling to appointments or in car, keep right leg elevated, no bending.  Wound Care to right lower leg: apply Adaptic guaze over graft/flap and cover with dry dressing. Change dressing every other day. Dry dressing as desired over posterior staple line.     Discharge patient    Complete by:  As directed      Driving Restrictions    Complete by:  As directed  No driving     Resume previous diet    Complete by:  As directed            Follow-up Information    Schedule an appointment as soon as possible for a visit with Michel Bickers, MD.   Specialty:  Infectious Diseases   Contact information:   [address] [place] [address] [phone]       Follow up with Mcarthur Rossetti, MD. Schedule an appointment as soon as possible for a visit in 2 weeks.   Specialty:  Orthopedic Surgery   Contact information:   [place] [address] [phone]       Follow up with Irene Limbo, MD On 11/02/2015.   Specialty:  Plastic Surgery   Why:  2:15 pm   Contact information:   [address] [place] [address] [phone]       Follow up with Mcarthur Rossetti, MD. Schedule an appointment as soon as possible for a visit in 2 weeks.   Specialty:  Orthopedic Surgery   Contact information:   [place] [address] [phone]        Signed: Mcarthur Rossetti 10/28/2015, 12:46 PM

## 2015-10-28 NOTE — Progress Notes (Signed)
Called Dr Trevor Mace office and left message with secretary re obtaining DC orders at 11:10

## 2015-10-28 NOTE — Progress Notes (Signed)
PIV removed and pt going home with PICC line. DC instructions and prescriptions given to pt. Pt verbalized understanding. Per case mgr everything is set up re [facility] equipment and antibiotics infusion. 1400 dose of ancef will be given by [facility] at pt's home today. Pt DCd via W/C and volunter at 1520. Family with him.

## 2015-10-28 NOTE — Progress Notes (Signed)
Patient set up with [name] visits for 6wks IV ancef, PICC care and wound care. Patient and his wife have received instructions on administering home IV antibiotics from home health agency and Alvis Lemmings will start service this pm.

## 2015-10-28 NOTE — Progress Notes (Signed)
Patient ID: Lucas [name], male   DOB: 11/18/1950, 65 y.o.   MRN: [id number] [facility] and drain per Plastics.  ID input greatly appreciated as well. Can be discharged to home in next 1-2 days likely.  Will remain non-weight bearing on right leg for next 4 weeks.

## 2015-10-28 NOTE — Progress Notes (Signed)
  POD #5 right medial gastrocnemius flap, STSG  Temp:  [98 F (36.7 C)-98.7 F (37.1 C)] 98 F (36.7 C) (01/04 0617) Pulse Rate:  [77-83] 78 (01/04 0617) Resp:  [18] 18 (01/04 0617) BP: (120-122)/(55-63) 120/55 mmHg (01/04 0617) SpO2:  [99 %-100 %] 99 % (01/04 0617)   JP  No output recorded  PE JP scant old blood- per patient not emptied yesterday Thigh dressing dry falling down but donor site covered VAC removed- flap viable with adherent skin graft, flat inset without signs dehiscence Posterior calf scabs over staples  A/P  JP removed. D/c VAC. Dressing change leg every other day with adaptic over grafted area/flap, cover with dry dressing. Dry dressing over posterior calf PRN. Keep right thigh dressing dry intact until follow up visit. No knee bending, NWB RLE. Must travel to medical appointments with leg raised, no bending.   intraop cultures bone no growth, initial culture S. Aureus.- plan long term Ancef and rifampin  Ok for home once [facility] for wound care, IV antibiotics arranged.Awaiting PICC. F/u next Monday in my clinic for donor site dressing take down, possible staple removal.  Counseled patient needs to refrain from smoking, at this point and wound healing problems would be high risk for amputation.  Irene Limbo, MD [facility] [phone]

## 2015-10-28 NOTE — Discharge Instructions (Signed)
No weight on your right leg for the next 4 weeks. Dry dressing and adaptic every other day to right leg graft site.

## 2015-10-28 NOTE — Progress Notes (Signed)
Called Dr Trevor Mace office at 1400 with the message that the med rec needs to be reconciled before Pt can DC.

## 2015-11-24 ENCOUNTER — Encounter: Payer: Self-pay | Admitting: Internal Medicine

## 2015-11-24 ENCOUNTER — Ambulatory Visit (INDEPENDENT_AMBULATORY_CARE_PROVIDER_SITE_OTHER): Payer: Medicare HMO | Admitting: Internal Medicine

## 2015-11-24 DIAGNOSIS — A4901 Methicillin susceptible Staphylococcus aureus infection, unspecified site: Secondary | ICD-10-CM | POA: Diagnosis not present

## 2015-11-24 DIAGNOSIS — M869 Osteomyelitis, unspecified: Secondary | ICD-10-CM

## 2015-11-24 MED ORDER — CEFAZOLIN SODIUM-DEXTROSE 2-3 GM-% IV SOLR: 2.0000 g | Freq: Three times a day (TID) | INTRAVENOUS | Status: AC

## 2015-11-24 MED ORDER — CEPHALEXIN 500 MG PO TABS: 500.0000 mg | ORAL_TABLET | Freq: Three times a day (TID) | ORAL | Status: DC

## 2015-11-24 NOTE — Assessment & Plan Note (Signed)
He is doing much better but has a very complex wound and fracture site. It does not appear that the fracture site is healed. I will continue IV cefazolin for 3 more days then have the PICC pulled and continue therapy with oral cephalexin and rifampin. I will repeat his inflammatory markers and see him back in 3 weeks.

## 2015-11-24 NOTE — Progress Notes (Signed)
[facility]  Patient Active Problem List   Diagnosis Date Noted  . Osteomyelitis due to staphylococcus aureus ([place]) 10/26/2015    Priority: High  . Surgical wound dehiscence 10/17/2015    Priority: High  . Fracture of right tibial plateau 09/17/2015    Priority: High  . Tibial plateau fracture 09/17/2015  . HTN (hypertension) 02/13/2013  . Ganglion cyst 01/03/2012  . PERIPHERAL NEUROPATHY 07/15/2010  . Alcohol abuse 05/14/2009  . DEFORMITY, FOOT NEC, CONGENITAL 08/22/2007  . ALLERGIC RHINITIS, SEASONAL 05/03/2007  . Hyperlipidemia 12/21/2006  . TOBACCO DEPENDENCE 12/21/2006  . COPD, mild ([place]) 12/21/2006    Patient's Medications  New Prescriptions   CEPHALEXIN 500 MG TABLET    Take 1 tablet (500 mg total) by mouth 3 (three) times daily.  Previous Medications   ALBUTEROL (VENTOLIN HFA) 108 (90 BASE) MCG/ACT INHALER    Inhale 2 puffs into the lungs every 4 (four) hours as needed for wheezing or shortness of breath.   ASPIRIN 325 MG TABLET    Take 1 tablet (325 mg total) by mouth 2 (two) times daily after a meal.   LOSARTAN-HYDROCHLOROTHIAZIDE (HYZAAR) 50-12.5 MG TABLET    TAKE 1 TABLET BY MOUTH DAILY.   OXYCODONE-ACETAMINOPHEN (ROXICET) 5-325 MG TABLET    Take 1-2 tablets by mouth every 4 (four) hours as needed.   RIFAMPIN (RIFADIN) 300 MG CAPSULE    Take 2 capsules (600 mg total) by mouth daily.   SIMVASTATIN (ZOCOR) 40 MG TABLET    TAKE 1 TABLET (40 MG TOTAL) BY MOUTH AT BEDTIME.   TIOTROPIUM BROMIDE MONOHYDRATE (SPIRIVA RESPIMAT) 2.5 MCG/ACT AERS    Inhale 2 puffs of inhaler once daily.   VENTOLIN HFA 108 (90 BASE) MCG/ACT INHALER    INHALE 2 PUFFS INTO THE LUNGS EVERY 4 (FOUR) HOURS AS NEEDED FOR WHEEZING OR SHORTNESS OF BREATH.  Modified Medications   Modified Medication Previous Medication   CEFAZOLIN (ANCEF) 2-3 GM-% SOLR ceFAZolin (ANCEF) 2-3 GM-% SOLR      Inject 50 mLs (2 g total) into the vein every 8 (eight) hours.    Inject 50  mLs (2 g total) into the vein every 8 (eight) hours.  Discontinued Medications   No medications on file    Subjective: Lucas [name] is in for his hospital follow-up visit. He suffered a right tibial plateau fracture last fall and developed postoperative wound infection after internal fixation. Operative cultures grew MSSA. He underwent flap reconstruction and was discharged on IV cefazolin and oral rifampin. He is now completed 39 days of therapy. He is feeling much better. He's had no problems tolerating his PICC for antibiotics. He saw Dr. Ninfa Linden last week and had x-rays performed. He has a photograph of the x-ray which appears to show persistence of the fracture site without complete healing. He has been released to start 50% weightbearing on his right leg.  Review of Systems: Review of Systems  Constitutional: Negative for fever, chills, weight loss, malaise/fatigue and diaphoresis.  HENT: Negative for sore throat.   Respiratory: Negative for cough, sputum production and shortness of breath.   Cardiovascular: Negative for chest pain.  Gastrointestinal: Negative for nausea, vomiting and diarrhea.  Musculoskeletal: Positive for joint pain.  Skin: Negative for rash.  Neurological: Negative for focal weakness.    Past Medical History  Diagnosis Date  . HYPERLIPIDEMIA 12/21/2006  . TOBACCO DEPENDENCE 12/21/2006  . HYPERTENSION, BENIGN SYSTEMIC 12/21/2006  . COPD 12/21/2006  .  Environmental allergies     Social History  Substance Use Topics  . Smoking status: Current Every Day Smoker -- 0.60 packs/day for 51 years    Types: Cigarettes  . Smokeless tobacco: Former Systems developer    Types: Snuff, Chew    Quit date: 01/05/1965  . Alcohol Use: 27.6 oz/week    0 Standard drinks or equivalent, 46 Cans of beer per week     Comment: 10/26/2015 "i'VE CUT DOWN TO A COUPLE 40s/day", 11/24/15 2-16 oz beers per day    No family history on file.  Allergies  Allergen Reactions  . Ace Inhibitors Swelling  .  Ampicillin Nausea And Vomiting  . Codeine Nausea And Vomiting  . Lisinopril Swelling    REACTION: angioedema  . Penicillins Nausea And Vomiting    Objective: Filed Vitals:   11/24/15 0849  BP: 165/78  Pulse: 94  Temp: 97.8 F (36.6 C)  TempSrc: Oral  Height: '5\' 11"'$  (1.803 m)  Weight: 143 lb 12 oz (65.205 kg)   Body mass index is 20.06 kg/(m^2).  Physical Exam  Constitutional: He is oriented to person, place, and time.  He is in good spirits.  Cardiovascular: Normal rate and regular rhythm.   No murmur heard. Pulmonary/Chest: Breath sounds normal.  Musculoskeletal:  His surgical incisions are healing nicely. He has no unusual swelling, warmth or erythema.  Neurological: He is alert and oriented to person, place, and time.  Skin: No rash noted.  Right arm PICC site appears normal.  Psychiatric: Mood and affect normal.    Lab Results SED RATE (mm/hr)  Date Value  10/24/2015 67*   CRP (mg/dL)  Date Value  10/24/2015 3.2*     Problem List Items Addressed This Visit      High   Osteomyelitis due to staphylococcus aureus [facility])    He is doing much better but has a very complex wound and fracture site. It does not appear that the fracture site is healed. I will continue IV cefazolin for 3 more days then have the PICC pulled and continue therapy with oral cephalexin and rifampin. I will repeat his inflammatory markers and see him back in 3 weeks.      Relevant Medications   ceFAZolin (ANCEF) 2-3 GM-% SOLR   Cephalexin 500 MG tablet (Start on 11/28/2015)       Lucas Bickers, Lucas [name] [facility] [phone] pager   [phone] cell 11/24/2015, 9:15 AM

## 2015-12-04 ENCOUNTER — Other Ambulatory Visit: Payer: Self-pay | Admitting: Obstetrics and Gynecology

## 2015-12-06 ENCOUNTER — Emergency Department (HOSPITAL_COMMUNITY)
Admission: EM | Admit: 2015-12-06 | Discharge: 2015-12-06 | Disposition: A | Discharge status: Home / Self Care | Payer: Medicare HMO | Attending: Emergency Medicine | Admitting: Emergency Medicine

## 2015-12-06 ENCOUNTER — Encounter (HOSPITAL_COMMUNITY): Payer: Self-pay

## 2015-12-06 ENCOUNTER — Emergency Department (HOSPITAL_COMMUNITY): Payer: Medicare HMO

## 2015-12-06 DIAGNOSIS — Z792 Long term (current) use of antibiotics: Secondary | ICD-10-CM | POA: Insufficient documentation

## 2015-12-06 DIAGNOSIS — Z79899 Other long term (current) drug therapy: Secondary | ICD-10-CM | POA: Diagnosis not present

## 2015-12-06 DIAGNOSIS — F17210 Nicotine dependence, cigarettes, uncomplicated: Secondary | ICD-10-CM | POA: Diagnosis not present

## 2015-12-06 DIAGNOSIS — J441 Chronic obstructive pulmonary disease with (acute) exacerbation: Secondary | ICD-10-CM | POA: Insufficient documentation

## 2015-12-06 DIAGNOSIS — I10 Essential (primary) hypertension: Secondary | ICD-10-CM | POA: Diagnosis not present

## 2015-12-06 DIAGNOSIS — Z880 Allergy status to penicillin: Secondary | ICD-10-CM | POA: Insufficient documentation

## 2015-12-06 DIAGNOSIS — E785 Hyperlipidemia, unspecified: Secondary | ICD-10-CM | POA: Diagnosis not present

## 2015-12-06 DIAGNOSIS — R0602 Shortness of breath: Secondary | ICD-10-CM | POA: Diagnosis present

## 2015-12-06 DIAGNOSIS — Z7982 Long term (current) use of aspirin: Secondary | ICD-10-CM | POA: Insufficient documentation

## 2015-12-06 LAB — CBC WITH DIFFERENTIAL/PLATELET
BASOS PCT: 0 %
Basophils Absolute: 0 10*3/uL (ref 0.0–0.1)
Eosinophils Absolute: 0.1 10*3/uL (ref 0.0–0.7)
Eosinophils Relative: 2 %
HEMATOCRIT: 38.8 % — AB (ref 39.0–52.0)
HEMOGLOBIN: 12.7 g/dL — AB (ref 13.0–17.0)
LYMPHS ABS: 3 10*3/uL (ref 0.7–4.0)
Lymphocytes Relative: 46 %
MCH: 30 pg (ref 26.0–34.0)
MCHC: 32.7 g/dL (ref 30.0–36.0)
MCV: 91.7 fL (ref 78.0–100.0)
MONOS PCT: 8 %
Monocytes Absolute: 0.5 10*3/uL (ref 0.1–1.0)
NEUTROS ABS: 2.8 10*3/uL (ref 1.7–7.7)
NEUTROS PCT: 44 %
Platelets: 250 10*3/uL (ref 150–400)
RBC: 4.23 MIL/uL (ref 4.22–5.81)
RDW: 14.2 % (ref 11.5–15.5)
WBC: 6.5 10*3/uL (ref 4.0–10.5)

## 2015-12-06 LAB — BASIC METABOLIC PANEL
Anion gap: 12 (ref 5–15)
BUN: 9 mg/dL (ref 6–20)
CHLORIDE: 102 mmol/L (ref 101–111)
CO2: 27 mmol/L (ref 22–32)
CREATININE: 0.82 mg/dL (ref 0.61–1.24)
Calcium: 10 mg/dL (ref 8.9–10.3)
GFR calc non Af Amer: >60 mL/min (ref >60)
Glucose, Bld: 110 mg/dL — ABNORMAL HIGH (ref 65–99)
POTASSIUM: 4.2 mmol/L (ref 3.5–5.1)
Sodium: 141 mmol/L (ref 135–145)

## 2015-12-06 LAB — I-STAT TROPONIN, ED: Troponin i, poc: 0 ng/mL (ref 0.00–0.08)

## 2015-12-06 MED ORDER — ALBUTEROL SULFATE HFA 108 (90 BASE) MCG/ACT IN AERS: 2.0000 | INHALATION_SPRAY | RESPIRATORY_TRACT | Status: DC | PRN

## 2015-12-06 MED ORDER — PREDNISONE 20 MG PO TABS: 60.0000 mg | ORAL_TABLET | Freq: Every day | ORAL | Status: DC

## 2015-12-06 NOTE — ED Provider Notes (Signed)
TIME SEEN: 6:30 AM  CHIEF COMPLAINT: Shortness of breath, wheezing  HPI: Pt is a 65 y.o. male with history of hypertension, hyperlipidemia, COPD who presents to the emergency department with shortness of breath and wheezing that started last night. Upon EMS arrival they report that he was in a tripod position speaking broken sentences and using accessory muscles. He was given 10 mg of albuterol, 1 mg of Atrovent and 125 mg of IV Solu-Medrol. Patient does not work oxygen at home. He is still smoking. He states that after these medications he now feels back to normal. Reports he did have chest tightness but this is gone. No fevers or cough. Is currently on antibiotics for history of osteomyelitis to the right lower extremity that is improving.  ROS: See HPI Constitutional: no fever  Eyes: no drainage  ENT: no runny nose   Cardiovascular:   chest pain  Resp:  SOB  GI: no vomiting GU: no dysuria Integumentary: no rash  Allergy: no hives  Musculoskeletal: no leg swelling  Neurological: no slurred speech ROS otherwise negative  PAST MEDICAL HISTORY/PAST SURGICAL HISTORY:  Past Medical History  Diagnosis Date  . HYPERLIPIDEMIA 12/21/2006  . TOBACCO DEPENDENCE 12/21/2006  . HYPERTENSION, BENIGN SYSTEMIC 12/21/2006  . COPD 12/21/2006  . Environmental allergies     MEDICATIONS:  Prior to Admission medications   Medication Sig Start Date End Date Taking? Authorizing Provider  albuterol (VENTOLIN HFA) 108 (90 BASE) MCG/ACT inhaler Inhale 2 puffs into the lungs every 4 (four) hours as needed for wheezing or shortness of breath. 08/12/15   Katheren Shams, DO  aspirin 325 MG tablet Take 1 tablet (325 mg total) by mouth 2 (two) times daily after a meal. 09/25/15   Mcarthur Rossetti, MD  Cephalexin 500 MG tablet Take 1 tablet (500 mg total) by mouth 3 (three) times daily. 11/28/15   Michel Bickers, MD  losartan-hydrochlorothiazide (HYZAAR) 50-12.5 MG tablet TAKE 1 TABLET BY MOUTH DAILY. 10/07/15    Katheren Shams, DO  oxyCODONE-acetaminophen (ROXICET) 5-325 MG tablet Take 1-2 tablets by mouth every 4 (four) hours as needed. Patient not taking: Reported on 11/24/2015 10/28/15   Mcarthur Rossetti, MD  rifampin (RIFADIN) 300 MG capsule Take 2 capsules (600 mg total) by mouth daily. 10/28/15   Mcarthur Rossetti, MD  simvastatin (ZOCOR) 40 MG tablet TAKE 1 TABLET (40 MG TOTAL) BY MOUTH AT BEDTIME. 10/07/15   Jazma Y Phelps, DO  SPIRIVA RESPIMAT 2.5 MCG/ACT AERS INHALE 2 PUFFS OF INHALER ONCE DAILY. 12/05/15   Katheren Shams, DO  VENTOLIN HFA 108 (90 BASE) MCG/ACT inhaler INHALE 2 PUFFS INTO THE LUNGS EVERY 4 (FOUR) HOURS AS NEEDED FOR WHEEZING OR SHORTNESS OF BREATH. 10/01/15   Katheren Shams, DO    ALLERGIES:  Allergies  Allergen Reactions  . Ace Inhibitors Swelling  . Ampicillin Nausea And Vomiting  . Codeine Nausea And Vomiting  . Lisinopril Swelling    REACTION: angioedema  . Penicillins Nausea And Vomiting    SOCIAL HISTORY:  Social History  Substance Use Topics  . Smoking status: Current Every Day Smoker -- 0.60 packs/day for 51 years    Types: Cigarettes  . Smokeless tobacco: Former Systems developer    Types: Snuff, Chew    Quit date: 01/05/1965  . Alcohol Use: 27.6 oz/week    0 Standard drinks or equivalent, 46 Cans of beer per week     Comment: 10/26/2015 "i'VE CUT DOWN TO A COUPLE 40s/day", 11/24/15 2-16 oz beers  per day    FAMILY HISTORY: No family history on file.  EXAM: BP 148/93 mmHg  Pulse 100  Temp(Src) 97 F (36.1 C) (Temporal)  Resp 26  Ht '5\' 11"'$  (1.803 m)  Wt 147 lb (66.679 kg)  BMI 20.51 kg/m2  SpO2 100% CONSTITUTIONAL: Alert and oriented and responds appropriately to questions. Well-appearing; well-nourished HEAD: Normocephalic EYES: Conjunctivae clear, PERRL ENT: normal nose; no rhinorrhea; moist mucous membranes; pharynx without lesions noted NECK: Supple, no meningismus, no LAD  CARD: RRR; S1 and S2 appreciated; no murmurs, no clicks, no rubs, no  gallops RESP: Normal chest excursion without splinting or tachypnea; breath sounds clear and equal bilaterally; no wheezes, no rhonchi, no rales, no hypoxia or respiratory distress, speaking full sentences; no increased work of breathing  ABD/GI: Normal bowel sounds; non-distended; soft, non-tender, no rebound, no guarding, no peritoneal signs BACK:  The back appears normal and is non-tender to palpation, there is no CVA tenderness EXT: Normal ROM in all joints; non-tender to palpation; no edema; normal capillary refill; no cyanosis, no calf tenderness or swelling    SKIN: Normal color for age and race; warm; healing surgical wound to the right medial lower leg with no erythema, warmth, tenderness, induration or fluctuance  NEURO: Moves all extremities equally, sensation to light touch intact diffusely, cranial nerves II through XII intact PSYCH: The patient's mood and manner are appropriate. Grooming and personal hygiene are appropriate.  MEDICAL DECISION MAKING:  Patient here COPD exacerbation that seems to have resolved. He now has good aeration and reports feeling back to baseline. Given he did have some chest tightness will obtain cardiac labs and chest x-ray. EKG shows no new ischemic changes. We'll monitor patient off of oxygen. No report of hypoxia at patient's home per EMS.  ED PROGRESS:  Patient's labs unremarkable. Troponin negative. Chest x-ray clear. Patient still feels back to normal and his lungs are clear with good air movement. He is able to ambulate and his sats did not drop below 100%. Given he is safe to be discharged home on prednisone burst with refill of his LP her on inhaler. Have discussed with patient and family return precautions. Have recommended he quit smoking. He does have a PCP for follow-up. He verbalized understanding and is comfortable with this plan.    EKG Interpretation  Date/Time:  Sunday December 06 2015 06:15:13 EST Ventricular Rate:  90 PR  Interval:  172 QRS Duration: 84 QT Interval:  348 QTC Calculation: 426 R Axis:   78 Text Interpretation:  Sinus rhythm Consider left atrial enlargement Probable anterior infarct, old No significant change since last tracing Confirmed by Jackquelyn Sundberg,  DO, Schuyler Olden ([id number]) on 12/06/2015 6:21:28 AM        [place], DO 12/06/15 6728

## 2015-12-06 NOTE — ED Notes (Signed)
Per [facility], pt from home, woke up having difficulty breathing. Was in tripod position upon EMS arrival, broken sentences, and diaphoretic using accessory muscles. Given albuterol 10 mg, atrovent 1 mg, and solumedrol 125 mg. During route pt reported relief from SOB. Dried up and had less use of accessory muscles. 18g to LAC. 100 % on treatment. RR 40 down to 24, HR 120 down to 100.

## 2015-12-06 NOTE — ED Notes (Signed)
Dr. Ward at the bedside.  

## 2015-12-06 NOTE — ED Notes (Signed)
Family at bedside. 

## 2015-12-06 NOTE — Discharge Instructions (Signed)
Chronic Obstructive Pulmonary Disease Exacerbation Chronic obstructive pulmonary disease (COPD) is a common lung condition in which airflow from the lungs is limited. COPD is a general term that can be used to describe many different lung problems that limit airflow, including chronic bronchitis and emphysema. COPD exacerbations are episodes when breathing symptoms become much worse and require extra treatment. Without treatment, COPD exacerbations can be life threatening, and frequent COPD exacerbations can cause further damage to your lungs. CAUSES  Respiratory infections.  Exposure to smoke.  Exposure to air pollution, chemical fumes, or dust. Sometimes there is no apparent cause or trigger. RISK FACTORS  Smoking cigarettes.  Older age.  Frequent prior COPD exacerbations. SIGNS AND SYMPTOMS  Increased coughing.  Increased thick spit (sputum) production.  Increased wheezing.  Increased shortness of breath.  Rapid breathing.  Chest tightness. DIAGNOSIS Your medical history, a physical exam, and tests will help your health care provider make a diagnosis. Tests may include:  A chest X-ray.  Basic lab tests.  Sputum testing.  An arterial blood gas test. TREATMENT Depending on the severity of your COPD exacerbation, you may need to be admitted to a hospital for treatment. Some of the treatments commonly used to treat COPD exacerbations are:   Antibiotic medicines.  Bronchodilators. These are drugs that expand the air passages. They may be given with an inhaler or nebulizer. Spacer devices may be needed to help improve drug delivery.  Corticosteroid medicines.  Supplemental oxygen therapy.  Airway clearing techniques, such as noninvasive ventilation (NIV) and positive expiratory pressure (PEP). These provide respiratory support through a mask or other noninvasive device. HOME CARE INSTRUCTIONS  Do not smoke. Quitting smoking is very important to prevent COPD from  getting worse and exacerbations from happening as often.  Avoid exposure to all substances that irritate the airway, especially to tobacco smoke.  If you were prescribed an antibiotic medicine, finish it all even if you start to feel better.  Take all medicines as directed by your health care provider.It is important to use correct technique with inhaled medicines.  Drink enough fluids to keep your urine clear or pale yellow (unless you have a medical condition that requires fluid restriction).  Use a cool mist vaporizer. This makes it easier to clear your chest when you cough.  If you have a home nebulizer and oxygen, continue to use them as directed.  Maintain all necessary vaccinations to prevent infections.  Exercise regularly.  Eat a healthy diet.  Keep all follow-up appointments as directed by your health care provider. SEEK IMMEDIATE MEDICAL CARE IF:  You have worsening shortness of breath.  You have trouble talking.  You have severe chest pain.  You have blood in your sputum.  You have a fever.  You have weakness, vomit repeatedly, or faint.  You feel confused.  You continue to get worse. MAKE SURE YOU:  Understand these instructions.  Will watch your condition.  Will get help right away if you are not doing well or get worse.   This information is not intended to replace advice given to you by your health care provider. Make sure you discuss any questions you have with your health care provider.   Document Released: 08/07/2007 Document Revised: 10/31/2014 Document Reviewed: 06/14/2013 Elsevier Interactive Patient Education 2016 [employer].   Smoking Cessation, Tips for Success If you are ready to quit smoking, congratulations! You have chosen to help yourself be healthier. Cigarettes bring nicotine, tar, carbon monoxide, and other irritants into your body.  Your lungs, heart, and blood vessels will be able to work better without these poisons. There  are many different ways to quit smoking. Nicotine gum, nicotine patches, a nicotine inhaler, or nicotine nasal spray can help with physical craving. Hypnosis, support groups, and medicines help break the habit of smoking. WHAT THINGS CAN I DO TO MAKE QUITTING EASIER?  Here are some tips to help you quit for good:  Pick a date when you will quit smoking completely. Tell all of your friends and family about your plan to quit on that date.  Do not try to slowly cut down on the number of cigarettes you are smoking. Pick a quit date and quit smoking completely starting on that day.  Throw away all cigarettes.   Clean and remove all ashtrays from your home, work, and car.  On a card, write down your reasons for quitting. Carry the card with you and read it when you get the urge to smoke.  Cleanse your body of nicotine. Drink enough water and fluids to keep your urine clear or pale yellow. Do this after quitting to flush the nicotine from your body.  Learn to predict your moods. Do not let a bad situation be your excuse to have a cigarette. Some situations in your life might tempt you into wanting a cigarette.  Never have "just one" cigarette. It leads to wanting another and another. Remind yourself of your decision to quit.  Change habits associated with smoking. If you smoked while driving or when feeling stressed, try other activities to replace smoking. Stand up when drinking your coffee. Brush your teeth after eating. Sit in a different chair when you read the paper. Avoid alcohol while trying to quit, and try to drink fewer caffeinated beverages. Alcohol and caffeine may urge you to smoke.  Avoid foods and drinks that can trigger a desire to smoke, such as sugary or spicy foods and alcohol.  Ask people who smoke not to smoke around you.  Have something planned to do right after eating or having a cup of coffee. For example, plan to take a walk or exercise.  Try a relaxation exercise to  calm you down and decrease your stress. Remember, you may be tense and nervous for the first 2 weeks after you quit, but this will pass.  Find new activities to keep your hands busy. Play with a pen, coin, or rubber band. Doodle or draw things on paper.  Brush your teeth right after eating. This will help cut down on the craving for the taste of tobacco after meals. You can also try mouthwash.   Use oral substitutes in place of cigarettes. Try using lemon drops, carrots, cinnamon sticks, or chewing gum. Keep them handy so they are available when you have the urge to smoke.  When you have the urge to smoke, try deep breathing.  Designate your home as a nonsmoking area.  If you are a heavy smoker, ask your health care provider about a prescription for nicotine chewing gum. It can ease your withdrawal from nicotine.  Reward yourself. Set aside the cigarette money you save and buy yourself something nice.  Look for support from others. Join a support group or smoking cessation program. Ask someone at home or at work to help you with your plan to quit smoking.  Always ask yourself, "Do I need this cigarette or is this just a reflex?" Tell yourself, "Today, I choose not to smoke," or "I do not want to  smoke." You are reminding yourself of your decision to quit.  Do not replace cigarette smoking with electronic cigarettes (commonly called e-cigarettes). The safety of e-cigarettes is unknown, and some may contain harmful chemicals.  If you relapse, do not give up! Plan ahead and think about what you will do the next time you get the urge to smoke. HOW WILL I FEEL WHEN I QUIT SMOKING? You may have symptoms of withdrawal because your body is used to nicotine (the addictive substance in cigarettes). You may crave cigarettes, be irritable, feel very hungry, cough often, get headaches, or have difficulty concentrating. The withdrawal symptoms are only temporary. They are strongest when you first quit but  will go away within 10-14 days. When withdrawal symptoms occur, stay in control. Think about your reasons for quitting. Remind yourself that these are signs that your body is healing and getting used to being without cigarettes. Remember that withdrawal symptoms are easier to treat than the major diseases that smoking can cause.  Even after the withdrawal is over, expect periodic urges to smoke. However, these cravings are generally short lived and will go away whether you smoke or not. Do not smoke! WHAT RESOURCES ARE AVAILABLE TO HELP ME QUIT SMOKING? Your health care provider can direct you to community resources or hospitals for support, which may include:  Group support.  Education.  Hypnosis.  Therapy.   This information is not intended to replace advice given to you by your health care provider. Make sure you discuss any questions you have with your health care provider.   Document Released: 07/08/2004 Document Revised: 10/31/2014 Document Reviewed: 03/28/2013 Elsevier Interactive Patient Education [employer].

## 2015-12-15 ENCOUNTER — Ambulatory Visit: Payer: Medicare HMO | Admitting: Internal Medicine

## 2015-12-22 ENCOUNTER — Ambulatory Visit (INDEPENDENT_AMBULATORY_CARE_PROVIDER_SITE_OTHER): Payer: Medicare HMO | Admitting: Obstetrics and Gynecology

## 2015-12-22 ENCOUNTER — Encounter: Payer: Self-pay | Admitting: Obstetrics and Gynecology

## 2015-12-22 VITALS — BP 142/65 | HR 99 | Temp 97.7°F | Ht 71.0 in | Wt 148.0 lb

## 2015-12-22 DIAGNOSIS — Z09 Encounter for follow-up examination after completed treatment for conditions other than malignant neoplasm: Secondary | ICD-10-CM

## 2015-12-22 DIAGNOSIS — J449 Chronic obstructive pulmonary disease, unspecified: Secondary | ICD-10-CM | POA: Diagnosis not present

## 2015-12-22 DIAGNOSIS — S82141D Displaced bicondylar fracture of right tibia, subsequent encounter for closed fracture with routine healing: Secondary | ICD-10-CM

## 2015-12-22 MED ORDER — ALBUTEROL SULFATE HFA 108 (90 BASE) MCG/ACT IN AERS: 2.0000 | INHALATION_SPRAY | RESPIRATORY_TRACT | Status: DC | PRN
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
## 2015-12-22 NOTE — Patient Instructions (Addendum)
Schedule a follow-up appointment with me at the end of March (about 3 weeks) for an annual physical exam. You will need blood work then and we will go over medical problems, etc.    Chronic Obstructive Pulmonary Disease Chronic obstructive pulmonary disease (COPD) is a common lung problem. In COPD, the flow of air from the lungs is limited. The way your lungs work will probably never return to normal, but there are things you can do to improve your lungs and make yourself feel better. Your doctor may treat your condition with:  Medicines.  Oxygen.  Lung surgery.  Changes to your diet.  Rehabilitation. This may involve a team of specialists. HOME CARE  Take all medicines as told by your doctor.  Avoid medicines or cough syrups that dry up your airway (such as antihistamines) and do not allow you to get rid of thick spit. You do not need to avoid them if told differently by your doctor.  If you smoke, stop. Smoking makes the problem worse.  Avoid being around things that make your breathing worse (like smoke, chemicals, and fumes).  Use oxygen therapy and therapy to help improve your lungs (pulmonary rehabilitation) if told by your doctor. If you need home oxygen therapy, ask your doctor if you should buy a tool to measure your oxygen level (oximeter).  Avoid people who have a sickness you can catch (contagious).  Avoid going outside when it is very hot, cold, or humid.  Eat healthy foods. Eat smaller meals more often. Rest before meals.  Stay active, but remember to also rest.  Make sure to get all the shots (vaccines) your doctor recommends. Ask your doctor if you need a pneumonia shot.  Learn and use tips on how to relax.  Learn and use tips on how to control your breathing as told by your doctor. Try:  Breathing in (inhaling) through your nose for 1 second. Then, pucker your lips and breath out (exhale) through your lips for 2 seconds.  Putting one hand on your belly  (abdomen). Breathe in slowly through your nose for 1 second. Your hand on your belly should move out. Pucker your lips and breathe out slowly through your lips. Your hand on your belly should move in as you breathe out.  Learn and use controlled coughing to clear thick spit from your lungs. The steps are: 1. Lean your head a little forward. 2. Breathe in deeply. 3. Try to hold your breath for 3 seconds. 4. Keep your mouth slightly open while coughing 2 times. 5. Spit any thick spit out into a tissue. 6. Rest and do the steps again 1 or 2 times as needed. GET HELP IF:  You cough up more thick spit than usual.  There is a change in the color or thickness of the spit.  It is harder to breathe than usual.  Your breathing is faster than usual. GET HELP RIGHT AWAY IF:  You have shortness of breath while resting.  You have shortness of breath that stops you from:  Being able to talk.  Doing normal activities.  You chest hurts for longer than 5 minutes.  Your skin color is more blue than usual.  Your pulse oximeter shows that you have low oxygen for longer than 5 minutes. MAKE SURE YOU:  Understand these instructions.  Will watch your condition.  Will get help right away if you are not doing well or get worse.   This information is not intended to replace  advice given to you by your health care provider. Make sure you discuss any questions you have with your health care provider.   Document Released: 03/28/2008 Document Revised: 10/31/2014 Document Reviewed: 06/06/2013 Elsevier Interactive Patient Education [employer].

## 2015-12-22 NOTE — Assessment & Plan Note (Signed)
Recent COPD exacerbation. Has not had a exacerbation in years. Usually well controlled. Recent exacerbation due to running out of medications. Today exam is benign. No wheezing or increased work of breathing. O2 saturation 100%. Patient completed steroid course. Again encouraged smoking cessation. Refilled albuterol inhaler which had expired. Handout given.

## 2015-12-25 LAB — AFB CULTURE WITH SMEAR (NOT AT ARMC): Acid Fast Smear: NONE SEEN

## 2016-01-15 ENCOUNTER — Encounter: Payer: Medicare HMO | Admitting: Obstetrics and Gynecology

## 2016-07-22 ENCOUNTER — Other Ambulatory Visit: Payer: Self-pay | Admitting: Obstetrics and Gynecology

## 2016-07-26 ENCOUNTER — Other Ambulatory Visit: Payer: Self-pay | Admitting: Obstetrics and Gynecology

## 2016-10-13 ENCOUNTER — Ambulatory Visit (INDEPENDENT_AMBULATORY_CARE_PROVIDER_SITE_OTHER): Payer: Medicare HMO | Admitting: Obstetrics and Gynecology

## 2016-10-13 ENCOUNTER — Encounter: Payer: Self-pay | Admitting: Obstetrics and Gynecology

## 2016-10-13 VITALS — BP 146/64 | HR 93 | Temp 97.5°F | Ht 71.0 in | Wt 160.0 lb

## 2016-10-13 DIAGNOSIS — I10 Essential (primary) hypertension: Secondary | ICD-10-CM | POA: Diagnosis not present

## 2016-10-13 DIAGNOSIS — F17200 Nicotine dependence, unspecified, uncomplicated: Secondary | ICD-10-CM | POA: Diagnosis not present

## 2016-10-13 DIAGNOSIS — Z0000 Encounter for general adult medical examination without abnormal findings: Secondary | ICD-10-CM

## 2016-10-13 DIAGNOSIS — E785 Hyperlipidemia, unspecified: Secondary | ICD-10-CM | POA: Diagnosis not present

## 2016-10-13 DIAGNOSIS — Z1159 Encounter for screening for other viral diseases: Secondary | ICD-10-CM

## 2016-10-13 DIAGNOSIS — Z1211 Encounter for screening for malignant neoplasm of colon: Secondary | ICD-10-CM

## 2016-10-13 DIAGNOSIS — J449 Chronic obstructive pulmonary disease, unspecified: Secondary | ICD-10-CM | POA: Diagnosis not present

## 2016-10-13 DIAGNOSIS — Z122 Encounter for screening for malignant neoplasm of respiratory organs: Secondary | ICD-10-CM

## 2016-10-13 MED ORDER — SIMVASTATIN 40 MG PO TABS: ORAL_TABLET | ORAL | 3 refills | Status: DC

## 2016-10-13 MED ORDER — ASPIRIN EC 81 MG PO TBEC: 81.0000 mg | DELAYED_RELEASE_TABLET | Freq: Every day | ORAL | 2 refills | Status: DC

## 2016-10-13 MED ORDER — TIOTROPIUM BROMIDE MONOHYDRATE 2.5 MCG/ACT IN AERS: INHALATION_SPRAY | RESPIRATORY_TRACT | 6 refills | Status: DC

## 2016-10-13 MED ORDER — ALBUTEROL SULFATE HFA 108 (90 BASE) MCG/ACT IN AERS: 2.0000 | INHALATION_SPRAY | RESPIRATORY_TRACT | 2 refills | Status: DC | PRN

## 2016-10-13 NOTE — Assessment & Plan Note (Signed)
Controlled. Goal BP <150/90. Compliant with medications. Continue therapy; no adjustments.

## 2016-10-13 NOTE — Assessment & Plan Note (Signed)
Well-controlled. No recent exacerbations. Refilled albuterol and Spiriva inhalers.

## 2016-10-13 NOTE — Assessment & Plan Note (Addendum)
Annual exam today. Blood work collected. Will screen for Hep C. Referral placed for colonoscopy. Low-dose CT scan ordered to screen for lung cancer. Patient up to date on flu vaccine. Started on low-dose ASA.

## 2016-10-13 NOTE — Assessment & Plan Note (Signed)
Encouraged tobacco cessation. Patient not ready to quit smoking. Has been smoking since he was 65yo. Smokes 1/2ppd. Order placed for low-dose CT scan to screen for lung cancer.

## 2016-10-13 NOTE — Patient Instructions (Addendum)
Things to do to Keep yourself Healthy - Exercise at least 30-45 minutes a day,  3-4 days a week.  - Eat a low-fat diet with lots of fruits and vegetables, up to 7-9 servings per day. - Seatbelts can save your life. Wear them always. - Smoke detectors on every level of your home, check batteries every year. - Eye Doctor - have an eye exam every 1-2 years - Safe sex - if you may be exposed to STDs, use a condom. - Alcohol If you drink, do it moderately,less than 2 drinks per day. - Quit smoking - [place].  Choose someone to speak for you if you are not able. - Depression is common in our stressful world.If you're feeling down or losing interest in things you normally enjoy, please come in for a visit.  Ordered CT scan for you to screen for cancer Also ordered colonoscopy screening  Medications refilled

## 2016-10-13 NOTE — Assessment & Plan Note (Signed)
Lipid panel collected today. Was out of his cholesterol medication. Refill given.

## 2016-10-13 NOTE — Progress Notes (Signed)
   Subjective:   65 y.o. year old male presents for preventative visit and annual physical.   Acute Concerns: None HTN- doing well. Compliant with medications. No side effects. COPD - controlled. No flare ups. Needs refills on medications. HLD- not taking medication because needs refill  Diet: does not eat out; wife cooks most meals; some fried. Not a lot of fruits and vegatables  Exercise: walks everyday 1-2 miles  Sexual History: sexually active with wife of 41 of years   POA/Living Will: No, but wants wife to make decisions   Social:  Social History   Social History  . Marital status: Married    Spouse name: N/A  . Number of children: N/A  . Years of education: N/A   Social History Main Topics  . Smoking status: Current Every Day Smoker    Packs/day: 0.60    Years: 51.00    Types: Cigarettes  . Smokeless tobacco: Former Systems developer    Types: Snuff, Chew    Quit date: 01/05/1965  . Alcohol use 27.6 oz/week    46 Cans of beer per week     Comment: 10/26/2015 "i'VE CUT DOWN TO A COUPLE 40s/day", 11/24/15 2-16 oz beers per day  . Drug use: No  . Sexual activity: Not Currently   Other Topics Concern  . Not on file   Social History Narrative  . No narrative on file    Immunization: Immunization History  Administered Date(s) Administered  . Influenza Split 01/07/2012  . Influenza Whole 08/22/2007  . Influenza,inj,Quad PF,36+ Mos 09/02/2013, 09/04/2014, 09/18/2015  . Pneumococcal Polysaccharide-23 09/06/2007, 09/04/2014  . Td 10/25/2003    Cancer Screening:  Colonoscopy: Due  Review of Systems: Per HPI.   Objective:  BP (!) 146/64   Pulse 93   Temp 97.5 F (36.4 C) (Oral)   Ht '5\' 11"'$  (1.803 m)   Wt 160 lb (72.6 kg)   SpO2 95%   BMI 22.32 kg/m   Gen:  65 y.o. male in NAD  HEENT: NCAT, MMM, EOMI, PERRL, anicteric sclerae, arcus senilis, nystagmus CV: RRR, no MRG, no JVD Resp: Non-labored, CTAB, no wheezes noted Abd: Soft, NTND, BS present, no guarding or  organomegaly Ext: WWP, no edema Skin: No rashes   MSK: Full ROM, strength intact Neuro: Alert and oriented, speech normal        Chemistry      Component Value Date/Time   NA 141 12/06/2015 0643   K 4.2 12/06/2015 0643   CL 102 12/06/2015 0643   CO2 27 12/06/2015 0643   BUN 9 12/06/2015 0643   CREATININE 0.82 12/06/2015 0643   CREATININE 0.90 01/28/2014 1438      Component Value Date/Time   CALCIUM 10.0 12/06/2015 0643   ALKPHOS 127 (H) 10/17/2015 1130   AST 31 10/17/2015 1130   ALT 17 10/17/2015 1130   BILITOT 0.6 10/17/2015 1130      Lab Results  Component Value Date   WBC 6.5 12/06/2015   HGB 12.7 (L) 12/06/2015   HCT 38.8 (L) 12/06/2015   MCV 91.7 12/06/2015   PLT 250 12/06/2015   Lab Results  Component Value Date   TSH 1.285 11/03/2006   No results found for: HGBA1C    ASSESSMENT & PLAN: 65 y.o. male presents for annual preventative exam. Please see problem specific assessment and plan.     Katheren Shams, DO PGY-3,  [facility] 10/13/2016 1:11 PM

## 2016-10-14 ENCOUNTER — Encounter: Payer: Self-pay | Admitting: Obstetrics and Gynecology

## 2016-10-14 LAB — LIPID PANEL
CHOL/HDL RATIO: 2.9 ratio (ref <5.0)
Cholesterol: 221 mg/dL — ABNORMAL HIGH (ref <200)
HDL: 75 mg/dL (ref >40)
LDL Cholesterol: 120 mg/dL — ABNORMAL HIGH (ref <100)
Triglycerides: 130 mg/dL (ref <150)
VLDL: 26 mg/dL (ref <30)

## 2016-10-14 LAB — HEPATITIS C ANTIBODY: HCV AB: NEGATIVE

## 2016-10-21 ENCOUNTER — Ambulatory Visit (HOSPITAL_COMMUNITY)
Admission: RE | Admit: 2016-10-21 | Discharge: 2016-10-21 | Disposition: A | Discharge status: Home / Self Care | Payer: Medicare HMO | Source: Ambulatory Visit | Attending: Family Medicine | Admitting: Family Medicine

## 2016-10-21 DIAGNOSIS — J439 Emphysema, unspecified: Secondary | ICD-10-CM | POA: Insufficient documentation

## 2016-10-21 DIAGNOSIS — I700 Atherosclerosis of aorta: Secondary | ICD-10-CM | POA: Insufficient documentation

## 2016-10-21 DIAGNOSIS — F17210 Nicotine dependence, cigarettes, uncomplicated: Secondary | ICD-10-CM | POA: Insufficient documentation

## 2016-10-21 DIAGNOSIS — I2510 Atherosclerotic heart disease of native coronary artery without angina pectoris: Secondary | ICD-10-CM | POA: Diagnosis not present

## 2016-10-21 DIAGNOSIS — F17200 Nicotine dependence, unspecified, uncomplicated: Secondary | ICD-10-CM

## 2016-10-22 ENCOUNTER — Other Ambulatory Visit: Payer: Self-pay | Admitting: Obstetrics and Gynecology

## 2016-10-27 ENCOUNTER — Encounter: Payer: Self-pay | Admitting: Obstetrics and Gynecology

## 2016-11-04 ENCOUNTER — Encounter: Payer: Self-pay | Admitting: Gastroenterology

## 2016-12-10 ENCOUNTER — Other Ambulatory Visit: Payer: Self-pay | Admitting: Obstetrics and Gynecology

## 2016-12-14 ENCOUNTER — Encounter: Payer: Self-pay | Admitting: Obstetrics and Gynecology

## 2016-12-14 ENCOUNTER — Ambulatory Visit (INDEPENDENT_AMBULATORY_CARE_PROVIDER_SITE_OTHER): Payer: Medicare HMO | Admitting: Obstetrics and Gynecology

## 2016-12-14 ENCOUNTER — Telehealth: Payer: Self-pay | Admitting: Obstetrics and Gynecology

## 2016-12-14 VITALS — BP 120/66 | HR 90 | Temp 98.4°F | Wt 160.0 lb

## 2016-12-14 DIAGNOSIS — I10 Essential (primary) hypertension: Secondary | ICD-10-CM | POA: Diagnosis not present

## 2016-12-14 DIAGNOSIS — F1010 Alcohol abuse, uncomplicated: Secondary | ICD-10-CM

## 2016-12-14 DIAGNOSIS — J449 Chronic obstructive pulmonary disease, unspecified: Secondary | ICD-10-CM

## 2016-12-14 DIAGNOSIS — F17200 Nicotine dependence, unspecified, uncomplicated: Secondary | ICD-10-CM

## 2016-12-14 MED ORDER — TIOTROPIUM BROMIDE MONOHYDRATE 2.5 MCG/ACT IN AERS: INHALATION_SPRAY | RESPIRATORY_TRACT | 2 refills | Status: DC

## 2016-12-14 NOTE — Patient Instructions (Signed)
Good seeing you today Refilled medication will call if I need to change anything Continue to cut back on smoking and drinking; exercise more  Follow-up 6 months or sooner as needed

## 2016-12-14 NOTE — Progress Notes (Signed)
     Subjective: Chief Complaint  Patient presents with  . Follow-up    3 mos  . Medication Problem     HPI: Lucas [name] is a 66 y.o. presenting to clinic today to discuss the following:  #COPD Needs inhaler refilled Well controlled Denies any exacerbations Denies dyspnea, increased sputum production, fevers  #HTN Compliant with medications Does not monitor at home ROS: Denies headache, dizziness, visual changes, nausea, vomiting, chest pain, abdominal pain or shortness of breath.  #Tobacco Use Continues to smoke Now at 1/2ppd Not ready to quit  #Alcohol Use Has decreased alcohol amount but continues to drink daily Drinks 2 cans of beer a day  Health Maintenance: Colonoscopy scheduled for next month     ROS noted in HPI.   Past Medical, Surgical, Social, and Family History Reviewed & Updated per EMR.    History  Smoking Status  . Current Every Day Smoker  . Packs/day: 0.60  . Years: 51.00  . Types: Cigarettes  Smokeless Tobacco  . Former Systems developer  . Types: Snuff, Chew  . Quit date: 01/05/1965    Objective: BP 120/66   Pulse 90   Temp 98.4 F (36.9 C) (Oral)   Wt 160 lb (72.6 kg)   SpO2 97%   BMI 22.32 kg/m  Vitals and nursing notes reviewed  Physical Exam  Constitutional: He is well-developed, well-nourished, and in no distress.  Cardiovascular: Normal rate, regular rhythm and normal heart sounds.   Pulmonary/Chest: Effort normal and breath sounds normal. He has no wheezes.   Assessment/Plan: Please see problem based Assessment and Plan PATIENT EDUCATION PROVIDED: See AVS    Diagnosis and plan along with any newly prescribed medication(s) were discussed in detail with this patient today. The patient verbalized understanding and agreed with the plan. Patient advised if symptoms worsen return to clinic or ER.    Meds ordered this encounter  Medications  . Tiotropium Bromide Monohydrate (SPIRIVA RESPIMAT) 2.5 MCG/ACT AERS    Sig: INHALE 2 PUFFS  EVERY DAY    Dispense:  4 g    Refill:  [place], DO 12/14/2016, 3:02 PM PGY-3, [place]

## 2016-12-15 ENCOUNTER — Other Ambulatory Visit: Payer: Self-pay | Admitting: Obstetrics and Gynecology

## 2016-12-20 NOTE — Assessment & Plan Note (Signed)
Continues to drink alcohol. At risk for withdrawals. Patient unwilling to quit at this time. Encouraged patient to decrease amount of alcohol.

## 2016-12-20 NOTE — Assessment & Plan Note (Signed)
Well-controlled. Stable. Continue current medications. Blood work next visit.

## 2016-12-20 NOTE — Assessment & Plan Note (Signed)
Continues to smoke. Low-dose CT screening showed Lung-RADS Category 2-S, benign appearance or behavior. Will continue annual screening with low-dose chest CT without contrast. Tobacco cessation encouraged.

## 2016-12-20 NOTE — Assessment & Plan Note (Signed)
Stable. Well-controlled on current therapies. Refills given.

## 2016-12-27 ENCOUNTER — Ambulatory Visit (AMBULATORY_SURGERY_CENTER): Payer: Self-pay

## 2016-12-27 VITALS — Ht 71.0 in | Wt 163.0 lb

## 2016-12-27 DIAGNOSIS — Z1211 Encounter for screening for malignant neoplasm of colon: Secondary | ICD-10-CM

## 2016-12-27 MED ORDER — NA SULFATE-K SULFATE-MG SULF 17.5-3.13-1.6 GM/177ML PO SOLN: ORAL | 0 refills | Status: DC

## 2016-12-27 NOTE — Progress Notes (Signed)
Per pt, no allergies to soy or egg products.Pt not taking any weight loss meds or using  O2 at home. 

## 2016-12-28 ENCOUNTER — Encounter: Payer: Self-pay | Admitting: Gastroenterology

## 2016-12-29 ENCOUNTER — Telehealth: Payer: Self-pay | Admitting: Obstetrics and Gynecology

## 2016-12-29 NOTE — Telephone Encounter (Signed)
Medicare says they will be contacting dr about his Spriva.  They had told pt they would not pay for it.

## 2016-12-29 NOTE — Telephone Encounter (Signed)
Will await forms or phone call from them.

## 2017-01-02 NOTE — Telephone Encounter (Signed)
Form in box completed for patient's Spiriva inhaler. Will put in fax pile. Thanks.

## 2017-01-03 ENCOUNTER — Encounter: Payer: Self-pay | Admitting: *Deleted

## 2017-01-03 ENCOUNTER — Ambulatory Visit (INDEPENDENT_AMBULATORY_CARE_PROVIDER_SITE_OTHER): Payer: Medicare HMO | Admitting: *Deleted

## 2017-01-03 VITALS — BP 138/64 | HR 92 | Temp 97.8°F | Ht 71.0 in | Wt 157.6 lb

## 2017-01-03 DIAGNOSIS — Z23 Encounter for immunization: Secondary | ICD-10-CM | POA: Diagnosis not present

## 2017-01-03 DIAGNOSIS — Z0000 Encounter for general adult medical examination without abnormal findings: Secondary | ICD-10-CM | POA: Diagnosis not present

## 2017-01-03 NOTE — Progress Notes (Signed)
Subjective:   Lucas [name] is a 66 y.o. male who presents for an Initial Medicare Annual Wellness Visit.  Cardiac Risk Factors include: advanced age (>45mn, >>63women);male gender;dyslipidemia;hypertension;smoking/ tobacco exposure    Objective:    Today's Vitals   01/03/17 1419  BP: 138/64  Pulse: 92  Temp: 97.8 F (36.6 C)  TempSrc: Oral  SpO2: 95%  Weight: 157 lb 9.6 oz (71.5 kg)  Height: 5' 11"  (1.803 m)  PainSc: 1   PainLoc: Leg   Body mass index is 21.98 kg/m.  Current Medications (verified) Outpatient Encounter Prescriptions as of 01/03/2017  Medication Sig  . albuterol (PROVENTIL HFA;VENTOLIN HFA) 108 (90 Base) MCG/ACT inhaler Inhale 2 puffs into the lungs every 4 (four) hours as needed for wheezing or shortness of breath.  .Marland Kitchenaspirin EC 81 MG tablet Take 1 tablet (81 mg total) by mouth daily.  .Marland Kitchenlosartan-hydrochlorothiazide (HYZAAR) 50-12.5 MG tablet TAKE 1 TABLET BY MOUTH DAILY.  . simvastatin (ZOCOR) 40 MG tablet TAKE 1 TABLET (40 MG TOTAL) BY MOUTH AT BEDTIME.  . Na Sulfate-K Sulfate-Mg Sulf (SUPREP BOWEL PREP KIT) 17.5-3.13-1.6 GM/180ML SOLN Suprep as directed / no substitutions  . Tiotropium Bromide Monohydrate (SPIRIVA RESPIMAT) 2.5 MCG/ACT AERS INHALE 2 PUFFS EVERY DAY (Patient not taking: Reported on 01/03/2017)  . Tiotropium Bromide Monohydrate (SPIRIVA RESPIMAT) 2.5 MCG/ACT AERS INHALE 2 PUFFS EVERY DAY   No facility-administered encounter medications on file as of 01/03/2017.     Allergies (verified) Ace inhibitors; Ampicillin; Penicillins; Codeine; and Lisinopril   History: Past Medical History:  Diagnosis Date  . Allergy   . COPD 12/21/2006  . Environmental allergies   . HYPERLIPIDEMIA 12/21/2006  . HYPERTENSION, BENIGN SYSTEMIC 12/21/2006  . TOBACCO DEPENDENCE 12/21/2006   Past Surgical History:  Procedure Laterality Date  . ANKLE SURGERY     left ankle  . APPLICATION OF WOUND VAC  01/06/2012   Procedure: APPLICATION OF WOUND VAC;   Surgeon: GMeredith Pel MD;  Location: [facility];  Service: Orthopedics;  Laterality: Left;  . APPLICATION OF WOUND VAC Right 10/17/2015   Procedure: APPLICATION OF WOUND VAC;  Surgeon: NLeandrew Koyanagi MD;  [place]  Service: Orthopedics;  Laterality: Right;  . EXTERNAL FIXATION LEG  01/06/2012   Procedure: EXTERNAL FIXATION LEG;  Surgeon: GMeredith Pel MD;  Location: [facility];  Service: Orthopedics;  Laterality: Left;  Smith-nephew external fixator set  . EXTERNAL FIXATION LEG Right 09/16/2015   Procedure: EXTERNAL FIXATION LEG/LARGE;  Surgeon: CMcarthur Rossetti MD;  [place] [facility]  Service: Orthopedics;  Laterality: Right;  . EXTERNAL FIXATION REMOVAL Right 09/22/2015   Procedure: REMOVAL EXTERNAL FIXATION LEG;  Surgeon: CMcarthur Rossetti MD;  [place] [facility]  Service: Orthopedics;  Laterality: Right;  . FASCIOTOMY  01/10/2012   Procedure: FASCIOTOMY;  Surgeon: MRozanna Box MD;  [place] [facility]  Service: Orthopedics;  Laterality: Left;  status post fasciotomy with wound vac left calf; adjustment external fixator left tibia under fluoro; retention suture/wound vac placement left lateral calf wound  . FASCIOTOMY  01/06/2012   Procedure: FASCIOTOMY;  Surgeon: GMeredith Pel MD;  [facility]: [facility];  Service: Orthopedics;  Laterality: Left;  Four compartment fasciotomy  . I&D EXTREMITY  01/12/2012   Procedure: IRRIGATION AND DEBRIDEMENT EXTREMITY;  Surgeon: MRozanna Box MD;  [place]  Service: Orthopedics;  Laterality: Left;  LAYERD CLOSURE LEFT LEG WOUND  . I&D EXTREMITY Right 10/17/2015   Procedure: IRRIGATION AND DEBRIDEMENT  EXTREMITY;  Surgeon: Leandrew Koyanagi, MD;  Location: [place];  Service: Orthopedics;  Laterality: Right;  . I&D EXTREMITY Right 10/20/2015   Procedure: REPEAT IRRIGATION AND DEBRIDEMENT EXTREMITY, right lower leg;  Surgeon: Mcarthur Rossetti, MD;  Location: [place];  Service: Orthopedics;  Laterality: Right;  . KNEE SURGERY     left  knee and right knee  . MASS EXCISION Left 09/22/2015   Procedure: EXCISION MASS;  Surgeon: Mcarthur Rossetti, MD;  Location: [place];  Service: Orthopedics;  Laterality: Left;  . ORIF TIBIA PLATEAU  02/07/2012   Procedure: OPEN REDUCTION INTERNAL FIXATION (ORIF) TIBIAL PLATEAU;  Surgeon: Rozanna Box, MD;  Location: [place];  Service: Orthopedics;  Laterality: Left;  ORIF LEFT TIBIAL PLATEAU/ REMOVE EXTERNAL FIXATION LEFT LEG  . ORIF TIBIA PLATEAU Right 09/22/2015   Procedure: REMOVAL OF EXTERNAL FIXATOR RIGHT LEG, OPEN REDUCTION INTERNAL FIXATION (ORIF) RIGHT TIBIAL PLATEAU, EXCISION SMALL MASS LEFT LEG;  Surgeon: Mcarthur Rossetti, MD;  Location: [place];  Service: Orthopedics;  Laterality: Right;  . SKIN SPLIT GRAFT Right 10/23/2015   Procedure: RIGHT GASTROC FLAP SPLIT THICKNESS SKIN GRAFT FROM RIGHT THING TO RIGHT LEG;  Surgeon: Irene Limbo, MD;  Location: [place];  Service: Plastics;  Laterality: Right;  . WRIST SURGERY     left   Family History  Problem Relation Age of Onset  . Emphysema Father    Social History   Occupational History  . Not on file.   Social History Main Topics  . Smoking status: Current Every Day Smoker    Packs/day: 0.50    Years: 51.00    Types: Cigarettes  . Smokeless tobacco: Former Systems developer    Types: Snuff, Chew    Quit date: 01/05/1965  . Alcohol use 1.8 - 2.4 oz/week    3 - 4 Cans of beer per week  . Drug use: No  . Sexual activity: Yes    Partners: Female   Tobacco Counseling Ready to quit: No Counseling given: Yes Information given on 1-800-QUIT-NOW  Activities of Daily Living In your present state of health, do you have any difficulty performing the following activities: 01/03/2017  Hearing? N  Vision? Y  Difficulty concentrating or making decisions? N  Walking or climbing stairs? Y  Dressing or bathing? N  Doing errands, shopping? N  Preparing Food and eating ? N  Using the Toilet? N  In the past six months, have you accidently  leaked urine? N  Do you have problems with loss of bowel control? N  Managing your Medications? N  Managing your Finances? N  Housekeeping or managing your Housekeeping? N  Some recent data might be hidden   Home Safety:  My home has a working smoke alarm:  Yes X 1           My home throw rugs have been fastened down to the floor or removed:  Removed I have non-slip mats in the bathtub and shower:  No, discussed getting.         All my home's stairs have railings or bannisters: One level home with 2 outside stairs without hand rail.           My home's floors, stairs and hallways are free from clutter, wires and cords:  Yes     I wear seatbelts consistently:  Yes   Immunizations and Health Maintenance Immunization History  Administered Date(s) Administered  . Influenza Split 01/07/2012  . Influenza Whole 08/22/2007  . Influenza,inj,Quad  PF,36+ Mos 09/02/2013, 09/04/2014, 09/18/2015  . Pneumococcal Polysaccharide-23 09/06/2007, 09/04/2014  . Td 10/25/2003  . Tdap 09/13/2015   Health Maintenance Due  Topic Date Due  . COLONOSCOPY  02/03/2014  . PNA vac Low Risk Adult (1 of 2 - PCV13) 09/05/2015  Prevnar and flu vaccine administered today Patient has appt for colonoscopy next week at [facility] Discussed receiving Shingrix at local pharmacy.  Patient Care Team: Katheren Shams, DO as PCP - General Mcarthur Rossetti, MD as Consulting Physician (Orthopedic Surgery) Lucio Edward, MD for GI  Indicate any recent Medical Services you may have received from other than [facility] providers in the past year (date may be approximate).    Assessment:   This is a routine wellness examination for Marisol.   Hearing/Vision screen  Hearing Screening   Method: Audiometry   125Hz  250Hz  500Hz  1000Hz  2000Hz  3000Hz  4000Hz  6000Hz  8000Hz   Right ear:   40 40 40  40    Left ear:   40 40 40  Fail      Dietary issues and exercise activities discussed: Current Exercise Habits: Home exercise routine,  Type of exercise: walking, Time (Minutes): 60, Frequency (Times/Week): 7, Weekly Exercise (Minutes/Week): 420, Intensity: Moderate, Exercise limited by: orthopedic condition(s)  Goals    . Blood Pressure < 140/90     Patient not ready to quit smoking at this time.  Depression Screen PHQ 2/9 Scores 01/03/2017 12/14/2016 10/13/2016 12/22/2015  PHQ - 2 Score 0 0 0 0    Fall Risk Fall Risk  01/03/2017 12/14/2016 10/13/2016 12/22/2015 11/24/2015  Falls in the past year? No No No No No   TUG Test:  Done in 8 seconds. Patient used both hands to push out of chair and to sit back down.  Cognitive Function: Mini-Cog  Passed with score 4/5  Screening Tests Health Maintenance  Topic Date Due  . COLONOSCOPY  02/03/2014  . PNA vac Low Risk Adult (1 of 2 - PCV13) 09/05/2015  . INFLUENZA VACCINE  06/26/2017 (Originally 05/24/2016)  . TETANUS/TDAP  09/12/2025  . Hepatitis C Screening  Completed        Plan:     During the course of the visit Jarvis was educated and counseled about the following appropriate screening and preventive services:   Vaccines to include Pneumoccal, Influenza, Td, Shingrix  Colorectal cancer screening  Cardiovascular disease screening  Diabetes screening  Nutrition counseling  Smoking cessation counseling  Patient Instructions (the written plan) were given to the patient.   Velora Heckler, RN   01/03/2017

## 2017-01-03 NOTE — Patient Instructions (Signed)
Fall Prevention in the Home Falls can cause injuries. They can happen to people of all ages. There are many things you can do to make your home safe and to help prevent falls. What can I do on the outside of my home?  Regularly fix the edges of walkways and driveways and fix any cracks.  Remove anything that might make you trip as you walk through a door, such as a raised step or threshold.  Trim any bushes or trees on the path to your home.  Use bright outdoor lighting.  Clear any walking paths of anything that might make someone trip, such as rocks or tools.  Regularly check to see if handrails are loose or broken. Make sure that both sides of any steps have handrails.  Any raised decks and porches should have guardrails on the edges.  Have any leaves, snow, or ice cleared regularly.  Use sand or salt on walking paths during winter.  Clean up any spills in your garage right away. This includes oil or grease spills. What can I do in the bathroom?  Use night lights.  Install grab bars by the toilet and in the tub and shower. Do not use towel bars as grab bars.  Use non-skid mats or decals in the tub or shower.  If you need to sit down in the shower, use a plastic, non-slip stool.  Keep the floor dry. Clean up any water that spills on the floor as soon as it happens.  Remove soap buildup in the tub or shower regularly.  Attach bath mats securely with double-sided non-slip rug tape.  Do not have throw rugs and other things on the floor that can make you trip. What can I do in the bedroom?  Use night lights.  Make sure that you have a light by your bed that is easy to reach.  Do not use any sheets or blankets that are too big for your bed. They should not hang down onto the floor.  Have a firm chair that has side arms. You can use this for support while you get dressed.  Do not have throw rugs and other things on the floor that can make you trip. What can I do in  the kitchen?  Clean up any spills right away.  Avoid walking on wet floors.  Keep items that you use a lot in easy-to-reach places.  If you need to reach something above you, use a strong step stool that has a grab bar.  Keep electrical cords out of the way.  Do not use floor polish or wax that makes floors slippery. If you must use wax, use non-skid floor wax.  Do not have throw rugs and other things on the floor that can make you trip. What can I do with my stairs?  Do not leave any items on the stairs.  Make sure that there are handrails on both sides of the stairs and use them. Fix handrails that are broken or loose. Make sure that handrails are as long as the stairways.  Check any carpeting to make sure that it is firmly attached to the stairs. Fix any carpet that is loose or worn.  Avoid having throw rugs at the top or bottom of the stairs. If you do have throw rugs, attach them to the floor with carpet tape.  Make sure that you have a light switch at the top of the stairs and the bottom of the stairs. If you  do not have them, ask someone to add them for you. What else can I do to help prevent falls?  Wear shoes that:  Do not have high heels.  Have rubber bottoms.  Are comfortable and fit you well.  Are closed at the toe. Do not wear sandals.  If you use a stepladder:  Make sure that it is fully opened. Do not climb a closed stepladder.  Make sure that both sides of the stepladder are locked into place.  Ask someone to hold it for you, if possible.  Clearly mark and make sure that you can see:  Any grab bars or handrails.  First and last steps.  Where the edge of each step is.  Use tools that help you move around (mobility aids) if they are needed. These include:  Canes.  Walkers.  Scooters.  Crutches.  Turn on the lights when you go into a dark area. Replace any light bulbs as soon as they burn out.  Set up your furniture so you have a clear  path. Avoid moving your furniture around.  If any of your floors are uneven, fix them.  If there are any pets around you, be aware of where they are.  Review your medicines with your doctor. Some medicines can make you feel dizzy. This can increase your chance of falling. Ask your doctor what other things that you can do to help prevent falls. This information is not intended to replace advice given to you by your health care provider. Make sure you discuss any questions you have with your health care provider. Document Released: 08/06/2009 Document Revised: 03/17/2016 Document Reviewed: 11/14/2014 Elsevier Interactive Patient Education  2017 [employer] Maintenance, Male A healthy lifestyle and preventive care is important for your health and wellness. Ask your health care provider about what schedule of regular examinations is right for you. What should I know about weight and diet?  Eat a Healthy Diet  Eat plenty of vegetables, fruits, whole grains, low-fat dairy products, and lean protein.  Do not eat a lot of foods high in solid fats, added sugars, or salt. Maintain a Healthy Weight  Regular exercise can help you achieve or maintain a healthy weight. You should:  Do at least 150 minutes of exercise each week. The exercise should increase your heart rate and make you sweat (moderate-intensity exercise).  Do strength-training exercises at least twice a week. Watch Your Levels of Cholesterol and Blood Lipids  Have your blood tested for lipids and cholesterol every 5 years starting at 67 years of age. If you are at high risk for heart disease, you should start having your blood tested when you are 67 years old. You may need to have your cholesterol levels checked more often if:  Your lipid or cholesterol levels are high.  You are older than 67 years of age.  You are at high risk for heart disease. What should I know about cancer screening? Many types of cancers can  be detected early and may often be prevented. Lung Cancer  You should be screened every year for lung cancer if:  You are a current smoker who has smoked for at least 30 years.  You are a former smoker who has quit within the past 15 years.  Talk to your health care provider about your screening options, when you should start screening, and how often you should be screened. Colorectal Cancer  Routine colorectal cancer screening usually begins at 67 years  of age and should be repeated every 5-10 years until you are 67 years old. You may need to be screened more often if early forms of precancerous polyps or small growths are found. Your health care provider may recommend screening at an earlier age if you have risk factors for colon cancer.  Your health care provider may recommend using home test kits to check for hidden blood in the stool.  A small camera at the end of a tube can be used to examine your colon (sigmoidoscopy or colonoscopy). This checks for the earliest forms of colorectal cancer. Prostate and Testicular Cancer  Depending on your age and overall health, your health care provider may do certain tests to screen for prostate and testicular cancer.  Talk to your health care provider about any symptoms or concerns you have about testicular or prostate cancer. Skin Cancer  Check your skin from head to toe regularly.  Tell your health care provider about any new moles or changes in moles, especially if:  There is a change in a mole's size, shape, or color.  You have a mole that is larger than a pencil eraser.  Always use sunscreen. Apply sunscreen liberally and repeat throughout the day.  Protect yourself by wearing long sleeves, pants, a wide-brimmed hat, and sunglasses when outside. What should I know about heart disease, diabetes, and high blood pressure?  If you are 24-24 years of age, have your blood pressure checked every 3-5 years. If you are 63 years of age or  older, have your blood pressure checked every year. You should have your blood pressure measured twice-once when you are at a hospital or clinic, and once when you are not at a hospital or clinic. Record the average of the two measurements. To check your blood pressure when you are not at a hospital or clinic, you can use:  An automated blood pressure machine at a pharmacy.  A home blood pressure monitor.  Talk to your health care provider about your target blood pressure.  If you are between 76-79 years old, ask your health care provider if you should take aspirin to prevent heart disease.  Have regular diabetes screenings by checking your fasting blood sugar level.  If you are at a normal weight and have a low risk for diabetes, have this test once every three years after the age of 59.  If you are overweight and have a high risk for diabetes, consider being tested at a younger age or more often.  A one-time screening for abdominal aortic aneurysm (AAA) by ultrasound is recommended for men aged 54-75 years who are current or former smokers. What should I know about preventing infection? Hepatitis B  If you have a higher risk for hepatitis B, you should be screened for this virus. Talk with your health care provider to find out if you are at risk for hepatitis B infection. Hepatitis C  Blood testing is recommended for:  Everyone born from [birth year] through 1966.  Anyone with known risk factors for hepatitis C. Sexually Transmitted Diseases (STDs)  You should be screened each year for STDs including gonorrhea and chlamydia if:  You are sexually active and are younger than 67 years of age.  You are older than 67 years of age and your health care provider tells you that you are at risk for this type of infection.  Your sexual activity has changed since you were last screened and you are at an increased risk for chlamydia  or gonorrhea. Ask your health care provider if you are at  risk.  Talk with your health care provider about whether you are at high risk of being infected with HIV. Your health care provider may recommend a prescription medicine to help prevent HIV infection. What else can I do?  Schedule regular health, dental, and eye exams.  Stay current with your vaccines (immunizations).  Do not use any tobacco products, such as cigarettes, chewing tobacco, and e-cigarettes. If you need help quitting, ask your health care provider.  Limit alcohol intake to no more than 2 drinks per day. One drink equals 12 ounces of beer, 5 ounces of wine, or 1 ounces of hard liquor.  Do not use street drugs.  Do not share needles.  Ask your health care provider for help if you need support or information about quitting drugs.  Tell your health care provider if you often feel depressed.  Tell your health care provider if you have ever been abused or do not feel safe at home. This information is not intended to replace advice given to you by your health care provider. Make sure you discuss any questions you have with your health care provider. Document Released: 04/07/2008 Document Revised: 06/08/2016 Document Reviewed: 07/14/2015 Elsevier Interactive Patient Education  2017 [employer].   Steps to Quit Smoking Smoking tobacco can be bad for your health. It can also affect almost every organ in your body. Smoking puts you and people around you at risk for many serious long-lasting (chronic) diseases. Quitting smoking is hard, but it is one of the best things that you can do for your health. It is never too late to quit. What are the benefits of quitting smoking? When you quit smoking, you lower your risk for getting serious diseases and conditions. They can include:  Lung cancer or lung disease.  Heart disease.  Stroke.  Heart attack.  Not being able to have children (infertility).  Weak bones (osteoporosis) and broken bones (fractures). If you have  coughing, wheezing, and shortness of breath, those symptoms may get better when you quit. You may also get sick less often. If you are pregnant, quitting smoking can help to lower your chances of having a baby of low birth weight. What can I do to help me quit smoking? Talk with your doctor about what can help you quit smoking. Some things you can do (strategies) include:  Quitting smoking totally, instead of slowly cutting back how much you smoke over a period of time.  Going to in-person counseling. You are more likely to quit if you go to many counseling sessions.  Using resources and support systems, such as:  Online chats with a Social worker.  Phone quitlines.  Printed Furniture conservator/restorer.  Support groups or group counseling.  Text messaging programs.  Mobile phone apps or applications.  Taking medicines. Some of these medicines may have nicotine in them. If you are pregnant or breastfeeding, do not take any medicines to quit smoking unless your doctor says it is okay. Talk with your doctor about counseling or other things that can help you. Talk with your doctor about using more than one strategy at the same time, such as taking medicines while you are also going to in-person counseling. This can help make quitting easier. What things can I do to make it easier to quit? Quitting smoking might feel very hard at first, but there is a lot that you can do to make it easier. Take these steps:  Talk to your family and friends. Ask them to support and encourage you.  Call phone quitlines, reach out to support groups, or work with a Social worker.  Ask people who smoke to not smoke around you.  Avoid places that make you want (trigger) to smoke, such as:  Bars.  Parties.  Smoke-break areas at work.  Spend time with people who do not smoke.  Lower the stress in your life. Stress can make you want to smoke. Try these things to help your stress:  Getting regular  exercise.  Deep-breathing exercises.  Yoga.  Meditating.  Doing a body scan. To do this, close your eyes, focus on one area of your body at a time from head to toe, and notice which parts of your body are tense. Try to relax the muscles in those areas.  Download or buy apps on your mobile phone or tablet that can help you stick to your quit plan. There are many free apps, such as QuitGuide from the [employer] Office manager for Disease Control and Prevention). You can find more support from smokefree.gov and other websites. This information is not intended to replace advice given to you by your health care provider. Make sure you discuss any questions you have with your health care provider. Document Released: 08/06/2009 Document Revised: 06/07/2016 Document Reviewed: 02/24/2015 Elsevier Interactive Patient Education  2017 [employer].

## 2017-01-03 NOTE — Progress Notes (Signed)
Addendum: I have reviewed this visit and discussed with Howell Rucks, RN, BSN, and agree with her documentation.   Luiz Blare, DO 01/03/2017, 4:40 PM PGY-3, [place]

## 2017-01-09 ENCOUNTER — Other Ambulatory Visit: Payer: Self-pay | Admitting: Obstetrics and Gynecology

## 2017-01-10 ENCOUNTER — Ambulatory Visit (AMBULATORY_SURGERY_CENTER): Payer: Medicare HMO | Admitting: Gastroenterology

## 2017-01-10 ENCOUNTER — Encounter: Payer: Self-pay | Admitting: Gastroenterology

## 2017-01-10 VITALS — BP 106/68 | HR 72 | Temp 98.7°F | Resp 17 | Ht 71.0 in | Wt 163.0 lb

## 2017-01-10 DIAGNOSIS — D123 Benign neoplasm of transverse colon: Secondary | ICD-10-CM | POA: Diagnosis not present

## 2017-01-10 DIAGNOSIS — K621 Rectal polyp: Secondary | ICD-10-CM

## 2017-01-10 DIAGNOSIS — Z1212 Encounter for screening for malignant neoplasm of rectum: Secondary | ICD-10-CM

## 2017-01-10 DIAGNOSIS — D129 Benign neoplasm of anus and anal canal: Secondary | ICD-10-CM

## 2017-01-10 DIAGNOSIS — D128 Benign neoplasm of rectum: Secondary | ICD-10-CM | POA: Diagnosis not present

## 2017-01-10 DIAGNOSIS — D124 Benign neoplasm of descending colon: Secondary | ICD-10-CM | POA: Diagnosis not present

## 2017-01-10 DIAGNOSIS — Z1211 Encounter for screening for malignant neoplasm of colon: Secondary | ICD-10-CM | POA: Diagnosis present

## 2017-01-10 MED ORDER — SODIUM CHLORIDE 0.9 % IV SOLN: 500.0000 mL | INTRAVENOUS | Status: DC

## 2017-01-10 NOTE — Patient Instructions (Signed)
Discharge instructions given. Handouts on polyps and hemorrhoids. Resume previous medications. YOU HAD AN ENDOSCOPIC PROCEDURE TODAY AT THE [facility]:   Refer to the procedure report that was given to you for any specific questions about what was found during the examination.  If the procedure report does not answer your questions, please call your gastroenterologist to clarify.  If you requested that your care partner not be given the details of your procedure findings, then the procedure report has been included in a sealed envelope for you to review at your convenience later.  YOU SHOULD EXPECT: Some feelings of bloating in the abdomen. Passage of more gas than usual.  Walking can help get rid of the air that was put into your GI tract during the procedure and reduce the bloating. If you had a lower endoscopy (such as a colonoscopy or flexible sigmoidoscopy) you may notice spotting of blood in your stool or on the toilet paper. If you underwent a bowel prep for your procedure, you may not have a normal bowel movement for a few days.  Please Note:  You might notice some irritation and congestion in your nose or some drainage.  This is from the oxygen used during your procedure.  There is no need for concern and it should clear up in a day or so.  SYMPTOMS TO REPORT IMMEDIATELY:   Following lower endoscopy (colonoscopy or flexible sigmoidoscopy):  Excessive amounts of blood in the stool  Significant tenderness or worsening of abdominal pains  Swelling of the abdomen that is new, acute  Fever of 100F or higher   For urgent or emergent issues, a gastroenterologist can be reached at any hour by calling [phone].   DIET:  We do recommend a small meal at first, but then you may proceed to your regular diet.  Drink plenty of fluids but you should avoid alcoholic beverages for 24 hours.  ACTIVITY:  You should plan to take it easy for the rest of today and you should NOT DRIVE  or use heavy machinery until tomorrow (because of the sedation medicines used during the test).    FOLLOW UP: Our staff will call the number listed on your records the next business day following your procedure to check on you and address any questions or concerns that you may have regarding the information given to you following your procedure. If we do not reach you, we will leave a message.  However, if you are feeling well and you are not experiencing any problems, there is no need to return our call.  We will assume that you have returned to your regular daily activities without incident.  If any biopsies were taken you will be contacted by phone or by letter within the next 1-3 weeks.  Please call us at [phone] if you have not heard about the biopsies in 3 weeks.    SIGNATURES/CONFIDENTIALITY: You and/or your care partner have signed paperwork which will be entered into your electronic medical record.  These signatures attest to the fact that that the information above on your After Visit Summary has been reviewed and is understood.  Full responsibility of the confidentiality of this discharge information lies with you and/or your care-partner. 

## 2017-01-10 NOTE — Progress Notes (Signed)
Pts states that he had a pneumonia vaccine last week and his right upper arm is "still hurting".  Right upper arm is warm to touch and red.  He states that it is sore to touch but does not hurt when he moves it.  I advised him that if he does not see improvement in the next few days, he will need to see his primary physician for her to evaluate.

## 2017-01-10 NOTE — Progress Notes (Signed)
A and O x3. Report to RN. Tolerated MAC anesthesia well.

## 2017-01-10 NOTE — Progress Notes (Signed)
Called to room to assist during endoscopic procedure.  Patient ID and intended procedure confirmed with present staff. Received instructions for my participation in the procedure from the performing physician.  

## 2017-01-10 NOTE — Progress Notes (Signed)
Pt's states no medical or surgical changes since previsit or office visit. 

## 2017-01-10 NOTE — Op Note (Signed)
[facility] Patient Name: Lucas [name] Procedure Date: 01/10/2017 10:26 AM MRN: [id number] Endoscopist: Ladene Artist , MD Age: 66 Referring MD:  Date of Birth: 1951-02-09 Gender: Male Account #: [id number] Procedure:                Colonoscopy Indications:              Screening for colorectal malignant neoplasm, This                            is the patient's first colonoscopy Medicines:                Monitored Anesthesia Care Procedure:                Pre-Anesthesia Assessment:                           - Prior to the procedure, a History and Physical                            was performed, and patient medications and                            allergies were reviewed. The patient's tolerance of                            previous anesthesia was also reviewed. The risks                            and benefits of the procedure and the sedation                            options and risks were discussed with the patient.                            All questions were answered, and informed consent                            was obtained. Prior Anticoagulants: The patient has                            taken no previous anticoagulant or antiplatelet                            agents. ASA Grade Assessment: II - A patient with                            mild systemic disease. After reviewing the risks                            and benefits, the patient was deemed in                            satisfactory condition to undergo the procedure.  After obtaining informed consent, the colonoscope                            was passed under direct vision. Throughout the                            procedure, the patient's blood pressure, pulse, and                            oxygen saturations were monitored continuously. The                            Model PCF-[id number]) scope was introduced                            through the anus and  advanced to the the cecum,                            identified by appendiceal orifice and ileocecal                            valve. The ileocecal valve, appendiceal orifice,                            and rectum were photographed. The quality of the                            bowel preparation was good. The colonoscopy was                            performed without difficulty. The patient tolerated                            the procedure well. Scope In: 10:35:11 AM Scope Out: 10:47:54 AM Scope Withdrawal Time: 0 hours 10 minutes 15 seconds  Total Procedure Duration: 0 hours 12 minutes 43 seconds  Findings:                 The perianal and digital rectal examinations were                            normal.                           A 10 mm polyp was found in the transverse colon.                            The polyp was sessile. The polyp was removed with a                            hot snare. Resection and retrieval were complete.                           Two sessile polyps were found in the descending  colon. The polyps were 4 mm in size. These polyps                            were removed with a cold biopsy forceps. Resection                            and retrieval were complete.                           A 8 mm polyp was found in the rectum. The polyp was                            semi-pedunculated. The polyp was removed with a                            cold snare. Resection and retrieval were complete.                           Internal hemorrhoids were found during                            retroflexion. The hemorrhoids were small and Grade                            I (internal hemorrhoids that do not prolapse).                           The exam was otherwise without abnormality on                            direct and retroflexion views. Complications:            No immediate complications. Estimated blood loss:                             None. Estimated Blood Loss:     Estimated blood loss: none. Impression:               - One 10 mm polyp in the transverse colon, removed                            with a hot snare. Resected and retrieved.                           - Two 4 mm polyps in the descending colon, removed                            with a cold biopsy forceps. Resected and retrieved.                           - One 8 mm polyp in the rectum, removed with a cold                            snare. Resected  and retrieved.                           - Internal hemorrhoids.                           - The examination was otherwise normal on direct                            and retroflexion views. Recommendation:           - Repeat colonoscopy in 3 - 5 years for                            surveillance pending pathology review.                           - Patient has a contact number available for                            emergencies. The signs and symptoms of potential                            delayed complications were discussed with the                            patient. Return to normal activities tomorrow.                            Written discharge instructions were provided to the                            patient.                           - Resume previous diet.                           - Continue present medications.                           - Await pathology results.                           - No aspirin, ibuprofen, naproxen, or other                            non-steroidal anti-inflammatory drugs for 2 weeks                            after polyp removal. Ladene Artist, MD 01/10/2017 10:51:29 AM This report has been signed electronically.

## 2017-01-11 ENCOUNTER — Telehealth: Payer: Self-pay

## 2017-01-11 NOTE — Telephone Encounter (Signed)
Left a message at [phone] for the pt that we were calling to check on her from her procedure. maw

## 2017-01-11 NOTE — Telephone Encounter (Signed)
  Follow up Call-  Call back number 01/10/2017  Post procedure Call Back phone  # [phone]  Permission to leave phone message Yes  Some recent data might be hidden     Patient questions:  Do you have a fever, pain , or abdominal swelling? No. Pain Score  0 *  Have you tolerated food without any problems? Yes.    Have you been able to return to your normal activities? Yes.    Do you have any questions about your discharge instructions: Diet   No. Medications  No. Follow up visit  No.  Do you have questions or concerns about your Care? No.  Actions: * If pain score is 4 or above: No action needed, pain <4.  Per pt no problems noted in the recovery room. maw

## 2017-01-20 ENCOUNTER — Encounter: Payer: Self-pay | Admitting: Gastroenterology

## 2017-01-24 ENCOUNTER — Encounter: Payer: Self-pay | Admitting: Obstetrics and Gynecology

## 2017-01-24 DIAGNOSIS — D126 Benign neoplasm of colon, unspecified: Secondary | ICD-10-CM | POA: Insufficient documentation

## 2017-03-26 ENCOUNTER — Emergency Department (HOSPITAL_COMMUNITY)
Admission: EM | Admit: 2017-03-26 | Discharge: 2017-03-27 | Disposition: A | Discharge status: Home / Self Care | Payer: Medicare HMO | Attending: Emergency Medicine | Admitting: Emergency Medicine

## 2017-03-26 DIAGNOSIS — Z79899 Other long term (current) drug therapy: Secondary | ICD-10-CM | POA: Insufficient documentation

## 2017-03-26 DIAGNOSIS — J449 Chronic obstructive pulmonary disease, unspecified: Secondary | ICD-10-CM | POA: Diagnosis not present

## 2017-03-26 DIAGNOSIS — F17210 Nicotine dependence, cigarettes, uncomplicated: Secondary | ICD-10-CM | POA: Diagnosis not present

## 2017-03-26 DIAGNOSIS — R42 Dizziness and giddiness: Secondary | ICD-10-CM | POA: Diagnosis not present

## 2017-03-26 DIAGNOSIS — R531 Weakness: Secondary | ICD-10-CM | POA: Diagnosis present

## 2017-03-26 DIAGNOSIS — R55 Syncope and collapse: Secondary | ICD-10-CM | POA: Diagnosis not present

## 2017-03-26 DIAGNOSIS — I10 Essential (primary) hypertension: Secondary | ICD-10-CM | POA: Insufficient documentation

## 2017-03-26 LAB — BASIC METABOLIC PANEL
ANION GAP: 13 (ref 5–15)
BUN: 6 mg/dL (ref 6–20)
CHLORIDE: 95 mmol/L — AB (ref 101–111)
CO2: 23 mmol/L (ref 22–32)
Calcium: 9.3 mg/dL (ref 8.9–10.3)
Creatinine, Ser: 1.1 mg/dL (ref 0.61–1.24)
GFR calc Af Amer: >60 mL/min (ref >60)
Glucose, Bld: 108 mg/dL — ABNORMAL HIGH (ref 65–99)
POTASSIUM: 3.9 mmol/L (ref 3.5–5.1)
SODIUM: 131 mmol/L — AB (ref 135–145)

## 2017-03-26 LAB — CBC
HEMATOCRIT: 40.8 % (ref 39.0–52.0)
HEMOGLOBIN: 14.2 g/dL (ref 13.0–17.0)
MCH: 31.2 pg (ref 26.0–34.0)
MCHC: 34.8 g/dL (ref 30.0–36.0)
MCV: 89.7 fL (ref 78.0–100.0)
Platelets: 250 10*3/uL (ref 150–400)
RBC: 4.55 MIL/uL (ref 4.22–5.81)
RDW: 13.2 % (ref 11.5–15.5)
WBC: 4.3 10*3/uL (ref 4.0–10.5)

## 2017-03-26 LAB — TROPONIN I: Troponin I: <0.03 ng/mL (ref <0.03)

## 2017-03-26 LAB — CBG MONITORING, ED: Glucose-Capillary: 65 mg/dL (ref 65–99)

## 2017-03-26 MED ORDER — SODIUM CHLORIDE 0.9 % IV BOLUS (SEPSIS)
1000.0000 mL | Freq: Once | INTRAVENOUS | Status: AC
  Administered 2017-03-26: 1000 mL via INTRAVENOUS

## 2017-03-26 MED ORDER — CALCIUM CARBONATE ANTACID 500 MG PO CHEW
1.0000 | CHEWABLE_TABLET | Freq: Once | ORAL | Status: AC
  Administered 2017-03-26: 200 mg via ORAL
  Filled 2017-03-26: qty 1

## 2017-03-26 NOTE — ED Triage Notes (Signed)
Episode of weakness, dizziness, and diaphoresis after walking to the store. Pt denies any LOC. Initial pressure with EMS 78/48, currently 94/62. Per EMS orthostatic BP negative for significant changes.

## 2017-03-26 NOTE — ED Provider Notes (Signed)
[facility] DEPT Provider Note   CSN: [id number] Arrival date & time: 03/26/17  2028     History   Chief Complaint Chief Complaint  Patient presents with  . Near Syncope    HPI Lucas [name] is a 66 y.o. male.  Patient presents after episode of near-syncope where he was generally weak and lightheaded and sweating after walking to the store in the heat. Patient admits to drinking 6 beers today and no other liquids in minimal other solids. Patient drinks regularly. Patient currently has no symptoms since fluid bolus from EMS. Patient was found hypotensive 78 systolic. Patient denies any fevers chest pain service of breath or cardiac history.      Past Medical History:  Diagnosis Date  . Allergy   . COPD 12/21/2006  . Environmental allergies   . HYPERLIPIDEMIA 12/21/2006  . HYPERTENSION, BENIGN SYSTEMIC 12/21/2006  . TOBACCO DEPENDENCE 12/21/2006    Patient Active Problem List   Diagnosis Date Noted  . Tubular adenoma of colon 01/24/2017  . Healthcare maintenance 10/13/2016  . HTN (hypertension) 02/13/2013  . Ganglion cyst 01/03/2012  . PERIPHERAL NEUROPATHY 07/15/2010  . Alcohol abuse 05/14/2009  . DEFORMITY, FOOT NEC, CONGENITAL 08/22/2007  . ALLERGIC RHINITIS, SEASONAL 05/03/2007  . Hyperlipidemia 12/21/2006  . TOBACCO DEPENDENCE 12/21/2006  . COPD, mild ([place]) 12/21/2006    Past Surgical History:  Procedure Laterality Date  . ANKLE SURGERY     left ankle  . APPLICATION OF WOUND VAC  01/06/2012   Procedure: APPLICATION OF WOUND VAC;  Surgeon: Meredith Pel, MD;  Location: [facility];  Service: Orthopedics;  Laterality: Left;  . APPLICATION OF WOUND VAC Right 10/17/2015   Procedure: APPLICATION OF WOUND VAC;  Surgeon: Leandrew Koyanagi, MD;  Location: [facility];  Service: Orthopedics;  Laterality: Right;  . EXTERNAL FIXATION LEG  01/06/2012   Procedure: EXTERNAL FIXATION LEG;  Surgeon: Meredith Pel, MD;  Location: [facility];  Service: Orthopedics;  Laterality: Left;   Smith-nephew external fixator set  . EXTERNAL FIXATION LEG Right 09/16/2015   Procedure: EXTERNAL FIXATION LEG/LARGE;  Surgeon: Mcarthur Rossetti, MD;  Location: [facility];  Service: Orthopedics;  Laterality: Right;  . EXTERNAL FIXATION REMOVAL Right 09/22/2015   Procedure: REMOVAL EXTERNAL FIXATION LEG;  Surgeon: Mcarthur Rossetti, MD;  Location: [facility];  Service: Orthopedics;  Laterality: Right;  . FASCIOTOMY  01/10/2012   Procedure: FASCIOTOMY;  Surgeon: Rozanna Box, MD;  Location: [facility];  Service: Orthopedics;  Laterality: Left;  status post fasciotomy with wound vac left calf; adjustment external fixator left tibia under fluoro; retention suture/wound vac placement left lateral calf wound  . FASCIOTOMY  01/06/2012   Procedure: FASCIOTOMY;  Surgeon: Meredith Pel, MD;  Location: [facility];  Service: Orthopedics;  Laterality: Left;  Four compartment fasciotomy  . I&D EXTREMITY  01/12/2012   Procedure: IRRIGATION AND DEBRIDEMENT EXTREMITY;  Surgeon: Rozanna Box, MD;  Location: [facility];  Service: Orthopedics;  Laterality: Left;  LAYERD CLOSURE LEFT LEG WOUND  . I&D EXTREMITY Right 10/17/2015   Procedure: IRRIGATION AND DEBRIDEMENT EXTREMITY;  Surgeon: Leandrew Koyanagi, MD;  Location: [facility];  Service: Orthopedics;  Laterality: Right;  . I&D EXTREMITY Right 10/20/2015   Procedure: REPEAT IRRIGATION AND DEBRIDEMENT EXTREMITY, right lower leg;  Surgeon: Mcarthur Rossetti, MD;  Location: [facility];  Service: Orthopedics;  Laterality: Right;  . KNEE SURGERY     left knee and right knee  . MASS EXCISION Left 09/22/2015   Procedure:  EXCISION MASS;  Surgeon: Mcarthur Rossetti, MD;  Location: [facility];  Service: Orthopedics;  Laterality: Left;  . ORIF TIBIA PLATEAU  02/07/2012   Procedure: OPEN REDUCTION INTERNAL FIXATION (ORIF) TIBIAL PLATEAU;  Surgeon: Rozanna Box, MD;  Location: [facility];  Service: Orthopedics;  Laterality: Left;  ORIF LEFT TIBIAL PLATEAU/ REMOVE EXTERNAL FIXATION LEFT LEG  .  ORIF TIBIA PLATEAU Right 09/22/2015   Procedure: REMOVAL OF EXTERNAL FIXATOR RIGHT LEG, OPEN REDUCTION INTERNAL FIXATION (ORIF) RIGHT TIBIAL PLATEAU, EXCISION SMALL MASS LEFT LEG;  Surgeon: Mcarthur Rossetti, MD;  Location: [facility];  Service: Orthopedics;  Laterality: Right;  . SKIN SPLIT GRAFT Right 10/23/2015   Procedure: RIGHT GASTROC FLAP SPLIT THICKNESS SKIN GRAFT FROM RIGHT THING TO RIGHT LEG;  Surgeon: Irene Limbo, MD;  Location: [facility];  Service: Plastics;  Laterality: Right;  . WRIST SURGERY     left       Home Medications    Prior to Admission medications   Medication Sig Start Date End Date Taking? Authorizing Provider  losartan-hydrochlorothiazide (HYZAAR) 50-12.5 MG tablet TAKE 1 TABLET BY MOUTH DAILY. 07/22/16  Yes Luiz Blare Y, DO  simvastatin (ZOCOR) 40 MG tablet TAKE 1 TABLET (40 MG TOTAL) BY MOUTH AT BEDTIME. 10/13/16  Yes Luiz Blare Y, DO  Tiotropium Bromide Monohydrate (SPIRIVA RESPIMAT) 2.5 MCG/ACT AERS INHALE 2 PUFFS EVERY DAY 12/14/16  Yes Luiz Blare Y, DO  VENTOLIN HFA 108 (90 Base) MCG/ACT inhaler INHALE 2 PUFFS INTO THE LUNGS EVERY 4 HOURS AS NEEDED FOR WHEEZING OR SHORTNESS OF BREATH 01/09/17  Yes Katheren Shams, DO  aspirin EC 81 MG tablet Take 1 tablet (81 mg total) by mouth daily. Patient not taking: Reported on 03/26/2017 10/13/16   Katheren Shams, DO    Family History Family History  Problem Relation Age of Onset  . Emphysema Father     Social History Social History  Substance Use Topics  . Smoking status: Current Every Day Smoker    Packs/day: 0.50    Years: 51.00    Types: Cigarettes  . Smokeless tobacco: Former Systems developer    Types: Snuff, Chew    Quit date: 01/05/1965  . Alcohol use 1.8 - 2.4 oz/week    3 - 4 Cans of beer per week     Allergies   Ace inhibitors; Ampicillin; Penicillins; Codeine; and Lisinopril   Review of Systems Review of Systems   Physical Exam Updated Vital Signs BP (!) 123/94   Pulse 81   Temp 97.6  F (36.4 C) (Oral)   Resp (!) 21   Ht 5\' 11"  (1.803 m)   Wt 74.8 kg (165 lb)   SpO2 95%   BMI 23.01 kg/m   Physical Exam  Constitutional: He is oriented to person, place, and time. He appears well-developed and well-nourished.  HENT:  Head: Normocephalic and atraumatic.  Eyes: Conjunctivae are normal. Right eye exhibits no discharge. Left eye exhibits no discharge.  Neck: Normal range of motion. Neck supple. No tracheal deviation present.  Cardiovascular: Normal rate and regular rhythm.   No murmur heard. Pulmonary/Chest: Effort normal and breath sounds normal.  Abdominal: Soft. He exhibits no distension. There is no tenderness. There is no guarding.  Musculoskeletal: He exhibits no edema.  Neurological: He is alert and oriented to person, place, and time.  5+ strength in UE and LE with f/e at major joints. Sensation to palpation intact in UE and LE. CNs 2-12 grossly intact.  EOMFI.  PERRL.  Finger nose and coordination intact bilateral.   Visual fields intact to finger testing. No nystagmus   Skin: Skin is warm. No rash noted.  Psychiatric: He has a normal mood and affect.  Nursing note and vitals reviewed.    ED Treatments / Results  Labs (all labs ordered are listed, but only abnormal results are displayed) Labs Reviewed  BASIC METABOLIC PANEL - Abnormal; Notable for the following:       Result Value   Sodium 131 (*)    Chloride 95 (*)    Glucose, Bld 108 (*)    All other components within normal limits  CBC  TROPONIN I  URINALYSIS, ROUTINE W REFLEX MICROSCOPIC  CBG MONITORING, ED  CBG MONITORING, ED    EKG  EKG Interpretation  Date/Time:  Sunday March 26 2017 20:33:19 EDT Ventricular Rate:  74 PR Interval:    QRS Duration: 95 QT Interval:  378 QTC Calculation: 420 R Axis:   86 Text Interpretation:  Sinus rhythm Borderline right axis deviation Probable left ventricular hypertrophy Anterior ST elevation, probably due to LVH Confirmed by Doretta Remmert MD, Vonna Kotyk  ([id number]) on 03/26/2017 11:01:50 PM       Radiology No results found.  Procedures Procedures (including critical care time)  Medications Ordered in ED Medications  sodium chloride 0.9 % bolus 1,000 mL (1,000 mLs Intravenous New Bag/Given 03/26/17 2243)  calcium carbonate (TUMS - dosed in mg elemental calcium) chewable tablet 200 mg of elemental calcium (200 mg of elemental calcium Oral Given 03/26/17 2241)     Initial Impression / Assessment and Plan / ED Course  I have reviewed the triage vital signs and the nursing notes.  Pertinent labs & imaging results that were available during my care of the patient were reviewed by me and considered in my medical decision making (see chart for details).     Patient presents with clinically orthostatic hypotension secondarily to alcohol abuse and decreased water intake. Patient has no symptoms in the ER. Screening lab work and cardiac screen unremarkable. Blood pressure stable in the ER, patient neurologically doing well and ambulates without difficulty.  Results and differential diagnosis were discussed with the patient/parent/guardian. Xrays were independently reviewed by myself.  Close follow up outpatient was discussed, comfortable with the plan.   Medications  sodium chloride 0.9 % bolus 1,000 mL (1,000 mLs Intravenous New Bag/Given 03/26/17 2243)  calcium carbonate (TUMS - dosed in mg elemental calcium) chewable tablet 200 mg of elemental calcium (200 mg of elemental calcium Oral Given 03/26/17 2241)    Vitals:   03/26/17 2037 03/26/17 2045 03/26/17 2100 03/26/17 2115  BP: 123/68 111/71 103/83 (!) 123/94  Pulse: 86 82 80 81  Resp: 20 19 19  (!) 21  Temp: 97.6 F (36.4 C)     TempSrc: Oral     SpO2: 96% 97% 93% 95%  Weight:      Height:        Final diagnoses:  Near syncope     Final Clinical Impressions(s) / ED Diagnoses   Final diagnoses:  Near syncope    New Prescriptions New Prescriptions   No medications on file       Elnora Morrison, MD 03/26/17 2346

## 2017-03-26 NOTE — ED Notes (Signed)
ED Provider at bedside. 

## 2017-03-26 NOTE — Discharge Instructions (Signed)
See your primary doctor this week. Stay well hydrated with water and improve nutrition.  Monitor alcohol use and decreased.  If you were given medicines take as directed.  If you are on coumadin or contraceptives realize their levels and effectiveness is altered by many different medicines.  If you have any reaction (rash, tongues swelling, other) to the medicines stop taking and see a physician.    If your blood pressure was elevated in the ER make sure you follow up for management with a primary doctor or return for chest pain, shortness of breath or stroke symptoms.  Please follow up as directed and return to the ER or see a physician for new or worsening symptoms.  Thank you. Vitals:   03/26/17 2037 03/26/17 2045 03/26/17 2100 03/26/17 2115  BP: 123/68 111/71 103/83 (!) 123/94  Pulse: 86 82 80 81  Resp: 20 19 19  (!) 21  Temp: 97.6 F (36.4 C)     TempSrc: Oral     SpO2: 96% 97% 93% 95%  Weight:      Height:

## 2017-04-03 ENCOUNTER — Other Ambulatory Visit: Payer: Self-pay | Admitting: *Deleted

## 2017-04-03 MED ORDER — TIOTROPIUM BROMIDE MONOHYDRATE 2.5 MCG/ACT IN AERS: INHALATION_SPRAY | RESPIRATORY_TRACT | 2 refills | Status: DC

## 2017-04-06 ENCOUNTER — Encounter: Payer: Self-pay | Admitting: Student

## 2017-04-06 ENCOUNTER — Ambulatory Visit (INDEPENDENT_AMBULATORY_CARE_PROVIDER_SITE_OTHER): Payer: Medicare HMO | Admitting: Student

## 2017-04-06 VITALS — BP 122/64 | HR 94 | Temp 98.1°F | Ht 71.0 in | Wt 150.8 lb

## 2017-04-06 DIAGNOSIS — R42 Dizziness and giddiness: Secondary | ICD-10-CM | POA: Diagnosis not present

## 2017-04-06 DIAGNOSIS — F1010 Alcohol abuse, uncomplicated: Secondary | ICD-10-CM | POA: Diagnosis not present

## 2017-04-06 MED ORDER — ALBUTEROL SULFATE HFA 108 (90 BASE) MCG/ACT IN AERS: INHALATION_SPRAY | RESPIRATORY_TRACT | 2 refills | Status: DC

## 2017-04-06 MED ORDER — LOSARTAN POTASSIUM-HCTZ 50-12.5 MG PO TABS: 1.0000 | ORAL_TABLET | Freq: Every day | ORAL | 6 refills | Status: DC

## 2017-04-06 NOTE — Patient Instructions (Signed)
Follow with your PCP in 1-2 months  Call the office with questions or concerns

## 2017-04-06 NOTE — Progress Notes (Signed)
   Subjective:    Patient ID: Lucas [name], male    DOB: 1950/12/12, 66 y.o.   MRN: [id number]   CC: ED follow up  HPI: 66 y/o M presents for ED follow up for weakness and lightheadedness after walking to the store in the heat and not drinking water only alcohol  Lightheadedness/weakness - improved now with no lightheadedness or dizziness - he drinks at least 6 beers per day and does not like to drink water but does drink flavored drinks like sweet tea - no chest pain or SOB  Alcohol abuse - drinks at least 6 beers a night   Smoking status reviewed Smokes 0.5 ppd  Review of Systems  Per HPI   Objective:  BP 122/64   Pulse 94   Temp 98.1 F (36.7 C) (Oral)   Ht 5\' 11"  (1.803 m)   Wt 150 lb 12.8 oz (68.4 kg)   SpO2 97%   BMI 21.03 kg/m  Vitals and nursing note reviewed  General: NAD Cardiac: RRR Respiratory: CTAB, normal effort Extremities: no edema or cyanosis. WWP. Skin: warm and dry, no rashes noted Neuro: alert and oriented, no focal deficits   Assessment & Plan:    Alcohol abuse Discussed cessation, at this time he is not interested - continue to follow for alcohol use  Lightheadedness/Dizziness- after walking in the heat in the setting of not drinking enough water - hydration precautions discussed especially for the summer month and in anticipation of upcoming hot weather  Meia Emley A. Lincoln Brigham MD, [facility] Resident PGY-3 Pager [phone]

## 2017-04-06 NOTE — Assessment & Plan Note (Signed)
Discussed cessation, at this time he is not interested - continue to follow for alcohol use

## 2017-04-22 ENCOUNTER — Emergency Department (HOSPITAL_COMMUNITY): Payer: Medicare HMO

## 2017-04-22 ENCOUNTER — Encounter (HOSPITAL_COMMUNITY): Payer: Self-pay | Admitting: *Deleted

## 2017-04-22 ENCOUNTER — Emergency Department (HOSPITAL_COMMUNITY)
Admission: EM | Admit: 2017-04-22 | Discharge: 2017-04-22 | Disposition: A | Discharge status: Home / Self Care | Payer: Medicare HMO | Attending: Emergency Medicine | Admitting: Emergency Medicine

## 2017-04-22 DIAGNOSIS — R0602 Shortness of breath: Secondary | ICD-10-CM | POA: Insufficient documentation

## 2017-04-22 DIAGNOSIS — Z5321 Procedure and treatment not carried out due to patient leaving prior to being seen by health care provider: Secondary | ICD-10-CM | POA: Diagnosis not present

## 2017-04-22 NOTE — ED Notes (Signed)
Pt called 3X, no answer

## 2017-04-22 NOTE — ED Notes (Signed)
Pt is not in room, not in lobby

## 2017-04-22 NOTE — ED Triage Notes (Signed)
Pt arrived by [facility], reports that he has hx of copd. Family member was spraying bug spray at home, pt inhaled it and then had coughing and sob. Pt used inhaler pta. No resp distress is noted at triage, spo2 97%.

## 2017-04-22 NOTE — ED Notes (Signed)
Secretary called out pt. Name x3, no response

## 2017-05-16 ENCOUNTER — Ambulatory Visit (INDEPENDENT_AMBULATORY_CARE_PROVIDER_SITE_OTHER): Payer: Medicare HMO | Admitting: Family Medicine

## 2017-05-16 ENCOUNTER — Encounter: Payer: Self-pay | Admitting: Family Medicine

## 2017-05-16 VITALS — BP 134/62 | HR 83 | Temp 98.3°F | Ht 71.0 in | Wt 150.8 lb

## 2017-05-16 DIAGNOSIS — J449 Chronic obstructive pulmonary disease, unspecified: Secondary | ICD-10-CM | POA: Diagnosis not present

## 2017-05-16 DIAGNOSIS — F17200 Nicotine dependence, unspecified, uncomplicated: Secondary | ICD-10-CM | POA: Diagnosis not present

## 2017-05-16 DIAGNOSIS — E785 Hyperlipidemia, unspecified: Secondary | ICD-10-CM | POA: Diagnosis not present

## 2017-05-16 MED ORDER — ROSUVASTATIN CALCIUM 20 MG PO TABS: 20.0000 mg | ORAL_TABLET | Freq: Every day | ORAL | 6 refills | Status: DC

## 2017-05-16 NOTE — Assessment & Plan Note (Signed)
  Chronic, well controlled on spiriva and albuterol  -continue current regimen -follow up 6 months

## 2017-05-16 NOTE — Patient Instructions (Addendum)
It was nice meeting you today!  Please start taking Crestor to lower your cholesterol. We'll check your cholesterol today.   The best thing you can do for your health is to quit smoking. You've done a great job cutting down, keep going!   If you have questions or concerns please do not hesitate to call at [phone].  Lucila Maine, DO PGY-2, [facility] 05/16/2017 3:13 PM    Coping with Quitting Smoking Quitting smoking is a physical and mental challenge. You will face cravings, withdrawal symptoms, and temptation. Before quitting, work with your health care provider to make a plan that can help you cope. Preparation can help you quit and keep you from giving in. How can I cope with cravings? Cravings usually last for 5-10 minutes. If you get through it, the craving will pass. Consider taking the following actions to help you cope with cravings:  Keep your mouth busy: ? Chew sugar-free gum. ? Suck on hard candies or a straw. ? Brush your teeth.  Keep your hands and body busy: ? Immediately change to a different activity when you feel a craving. ? Squeeze or play with a ball. ? Do an activity or a hobby, like making bead jewelry, practicing needlepoint, or working with wood. ? Mix up your normal routine. ? Take a short exercise break. Go for a quick walk or run up and down stairs. ? Spend time in public places where smoking is not allowed.  Focus on doing something kind or helpful for someone else.  Call a friend or family member to talk during a craving.  Join a support group.  Call a quit line, such as 1-800-QUIT-NOW.  Talk with your health care provider about medicines that might help you cope with cravings and make quitting easier for you.  How can I deal with withdrawal symptoms? Your body may experience negative effects as it tries to get used to not having nicotine in the system. These effects are called withdrawal symptoms. They may  include:  Feeling hungrier than normal.  Trouble concentrating.  Irritability.  Trouble sleeping.  Feeling depressed.  Restlessness and agitation.  Craving a cigarette.  To manage withdrawal symptoms:  Avoid places, people, and activities that trigger your cravings.  Remember why you want to quit.  Get plenty of sleep.  Avoid coffee and other caffeinated drinks. These may worsen some of your symptoms.  How can I handle social situations? Social situations can be difficult when you are quitting smoking, especially in the first few weeks. To manage this, you can:  Avoid parties, bars, and other social situations where people might be smoking.  Avoid alcohol.  Leave right away if you have the urge to smoke.  Explain to your family and friends that you are quitting smoking. Ask for understanding and support.  Plan activities with friends or family where smoking is not an option.  What are some ways I can cope with stress? Wanting to smoke may cause stress, and stress can make you want to smoke. Find ways to manage your stress. Relaxation techniques can help. For example:  Breathe slowly and deeply, in through your nose and out through your mouth.  Listen to soothing, relaxing music.  Talk with a family member or friend about your stress.  Light a candle.  Soak in a bath or take a shower.  Think about a peaceful place.  What are some ways I can prevent weight gain? Be aware that many people gain  weight after they quit smoking. However, not everyone does. To keep from gaining weight, have a plan in place before you quit and stick to the plan after you quit. Your plan should include:  Having healthy snacks. When you have a craving, it may help to: ? Eat plain popcorn, crunchy carrots, celery, or other cut vegetables. ? Chew sugar-free gum.  Changing how you eat: ? Eat small portion sizes at meals. ? Eat 4-6 small meals throughout the day instead of 1-2 large  meals a day. ? Be mindful when you eat. Do not watch television or do other things that might distract you as you eat.  Exercising regularly: ? Make time to exercise each day. If you do not have time for a long workout, do short bouts of exercise for 5-10 minutes several times a day. ? Do some form of strengthening exercise, like weight lifting, and some form of aerobic exercise, like running or swimming.  Drinking plenty of water or other low-calorie or no-calorie drinks. Drink 6-8 glasses of water daily, or as much as instructed by your health care provider.  Summary  Quitting smoking is a physical and mental challenge. You will face cravings, withdrawal symptoms, and temptation to smoke again. Preparation can help you as you go through these challenges.  You can cope with cravings by keeping your mouth busy (such as by chewing gum), keeping your body and hands busy, and making calls to family, friends, or a helpline for people who want to quit smoking.  You can cope with withdrawal symptoms by avoiding places where people smoke, avoiding drinks with caffeine, and getting plenty of rest.  Ask your health care provider about the different ways to prevent weight gain, avoid stress, and handle social situations. This information is not intended to replace advice given to you by your health care provider. Make sure you discuss any questions you have with your health care provider. Document Released: 10/07/2016 Document Revised: 10/07/2016 Document Reviewed: 10/07/2016 Elsevier Interactive Patient Education  [employer].

## 2017-05-16 NOTE — Assessment & Plan Note (Signed)
  Patient with known HLD, not currently taking any medication as he states he got "too much zocor" and it "went bad". Willing to start new medication. ASCVD risk 22.6% with previous lab values.  -repeat lipid panel today -rx given for 20 mg crestor -encourage smoking cessation as above

## 2017-05-16 NOTE — Assessment & Plan Note (Signed)
  Smoking cessation encouraged today, counseling to quit in detail with strategies to quit provided.  -continue to encourage smoking cessation at future visits -for now patient prefers to forgo medication, consider Chantix in future

## 2017-05-16 NOTE — Progress Notes (Signed)
    Subjective:    Patient ID: Lucas [name], male    DOB: 1951-04-22, 66 y.o.   MRN: [id number]   CC: here to follow up on COPD and hypotension  Doing well overall. Was told to follow up after near syncope last month. He has been trying to stay hydrated better and has been doing a good job with this. Denies feeling lightheaded or faint since. Taking BP medications as prescribed. No CP or SOB.  SH- lives with wife, walks every day for exercise. Son lives in [place]. Smoke 1ppd, down from 3 ppd. 50+ pack year smoking history. Alcohol- drinks about 2-3 beers a day. Has cut down on drinking a lot as well. No illicit drug use.  Review of Systems- see HPI  Objective:  BP 134/62   Pulse 83   Temp 98.3 F (36.8 C) (Oral)   Ht 5\' 11"  (1.803 m)   Wt 150 lb 12.8 oz (68.4 kg)   SpO2 96%   BMI 21.03 kg/m  Vitals and nursing note reviewed  General: thin elderly gentleman in NAD Neck: supple, non-tender, without lymphadenopathy Cardiac: RRR, clear S1 and S2, no murmurs, rubs, or gallops Respiratory: clear to auscultation bilaterally, no increased work of breathing Extremities: no edema or cyanosis Neuro: alert and oriented, no focal deficits  Assessment & Plan:    TOBACCO DEPENDENCE  Smoking cessation encouraged today, counseling to quit in detail with strategies to quit provided.  -continue to encourage smoking cessation at future visits -for now patient prefers to forgo medication, consider Chantix in future  COPD, mild ([facility])  Chronic, well controlled on spiriva and albuterol  -continue current regimen -follow up 6 months  Hyperlipidemia  Patient with known HLD, not currently taking any medication as he states he got "too much zocor" and it "went bad". Willing to start new medication. ASCVD risk 22.6% with previous lab values.  -repeat lipid panel today -rx given for 20 mg crestor -encourage smoking cessation as above    Return in about 6 months (around 11/16/2017),  or as needed.   Lucila Maine, DO Family Medicine Resident PGY-2

## 2017-05-17 LAB — LDL CHOLESTEROL, DIRECT: LDL DIRECT: 105 mg/dL — AB (ref 0–99)

## 2017-07-26 ENCOUNTER — Other Ambulatory Visit: Payer: Self-pay | Admitting: Internal Medicine

## 2017-09-05 IMAGING — RF DG C-ARM 61-120 MIN
1 series · 2 of 2 positions shown · non-contrast
Comparison: None.

CLINICAL DATA: Tibial plateau fractures

EXAM:
RIGHT KNEE - 3 VIEW; DG C-ARM 61-120 MIN
FLUOROSCOPY TIME:  0 minutes 26 seconds; 2 submitted images

[Series 1: run · 2 of 2 slices shown]
[im 1/2]
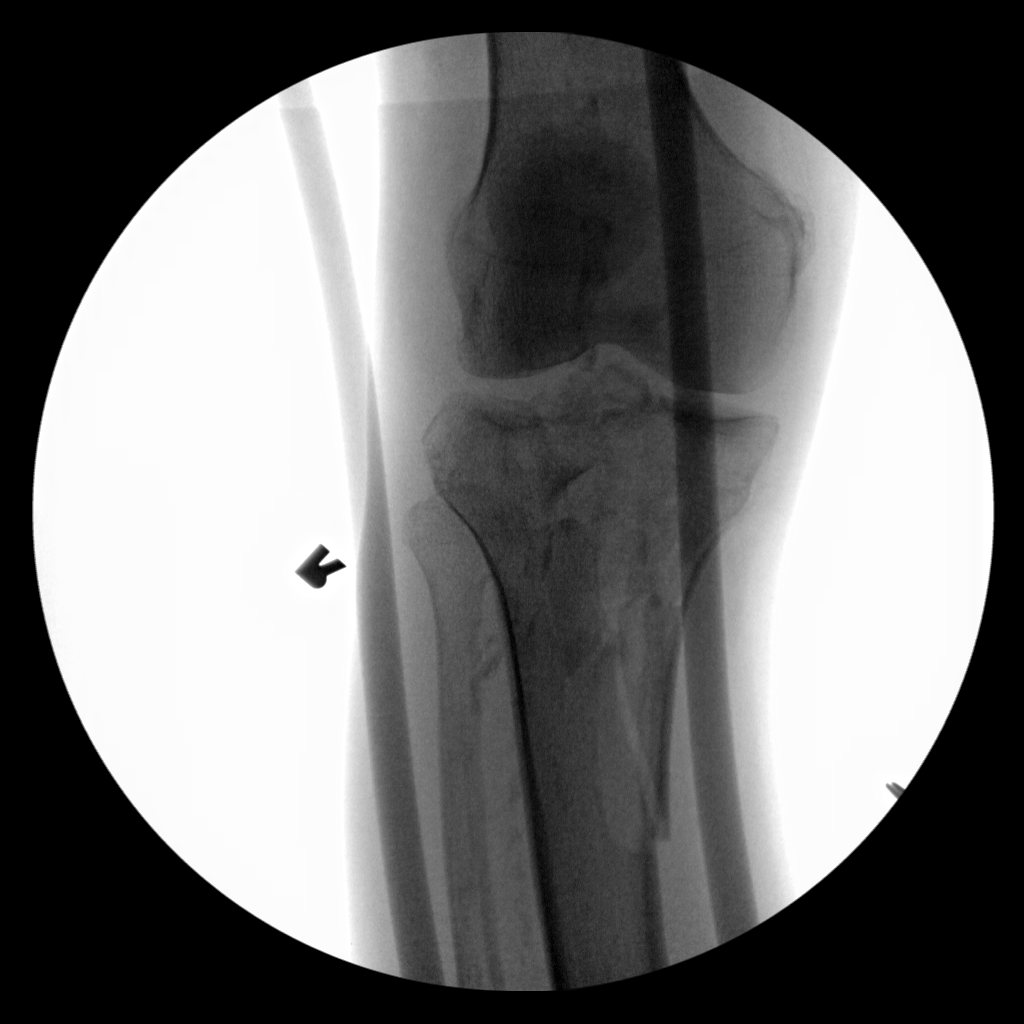
[im 2/2]
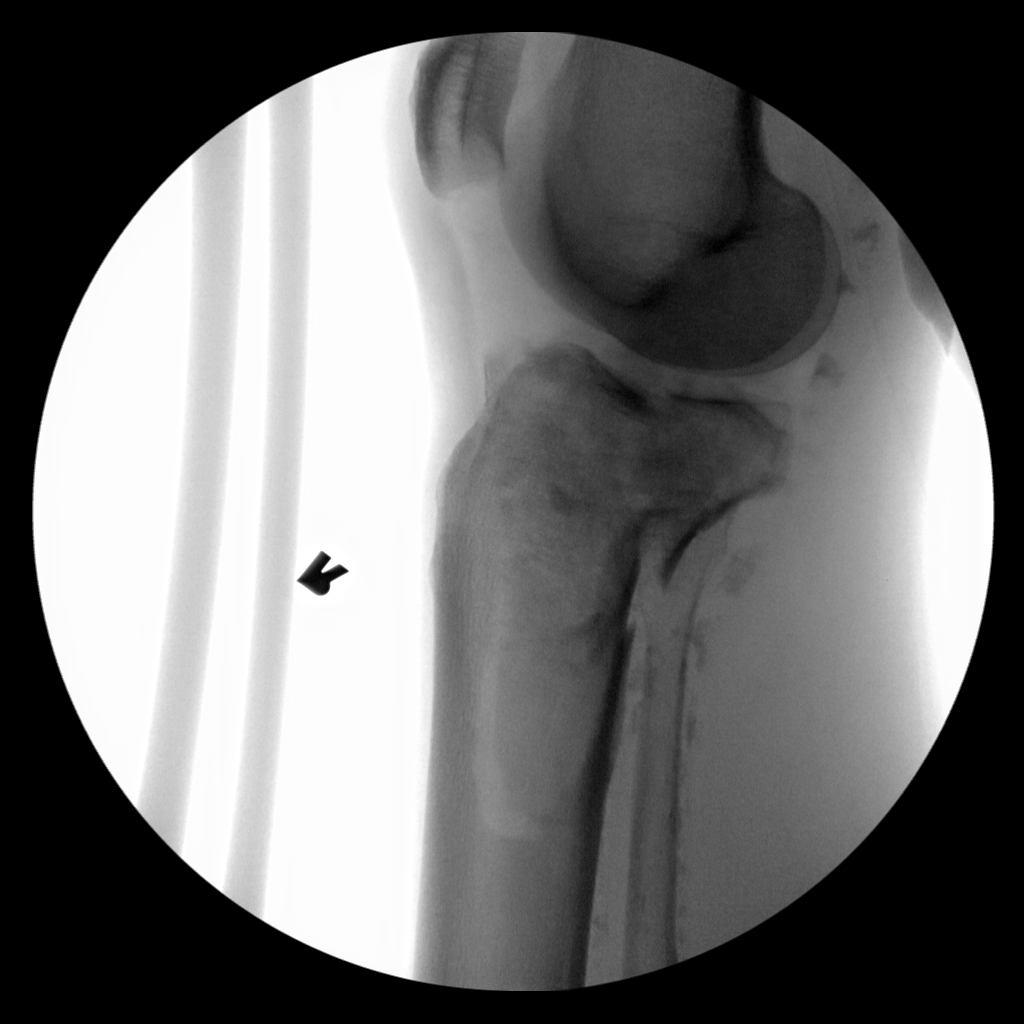

[2 of 2 positions shown; findings below may reference images not displayed]

FINDINGS: Frontal and lateral view show comminuted fractures through the
lateral aspect of the medial tibial plateau in the medial aspect of
the lateral tibial plateau. Fractures extend throughout the proximal
tibial metaphysis and into the medial proximal tibial diaphysis.
There is posterior displacement of the medial tibial plateau
compared to the remainder the tibia. No dislocation. Extensive
arterial vascular calcification identified.
IMPRESSION: Comminuted proximal tibial fracture.  No gross dislocation.

## 2017-09-05 IMAGING — CR DG PORTABLE PELVIS
1 series · 1 of 1 positions shown · non-contrast
Comparison: No recent.

CLINICAL DATA: Trauma.

EXAM:
PORTABLE PELVIS 1-2 VIEWS

[AP]
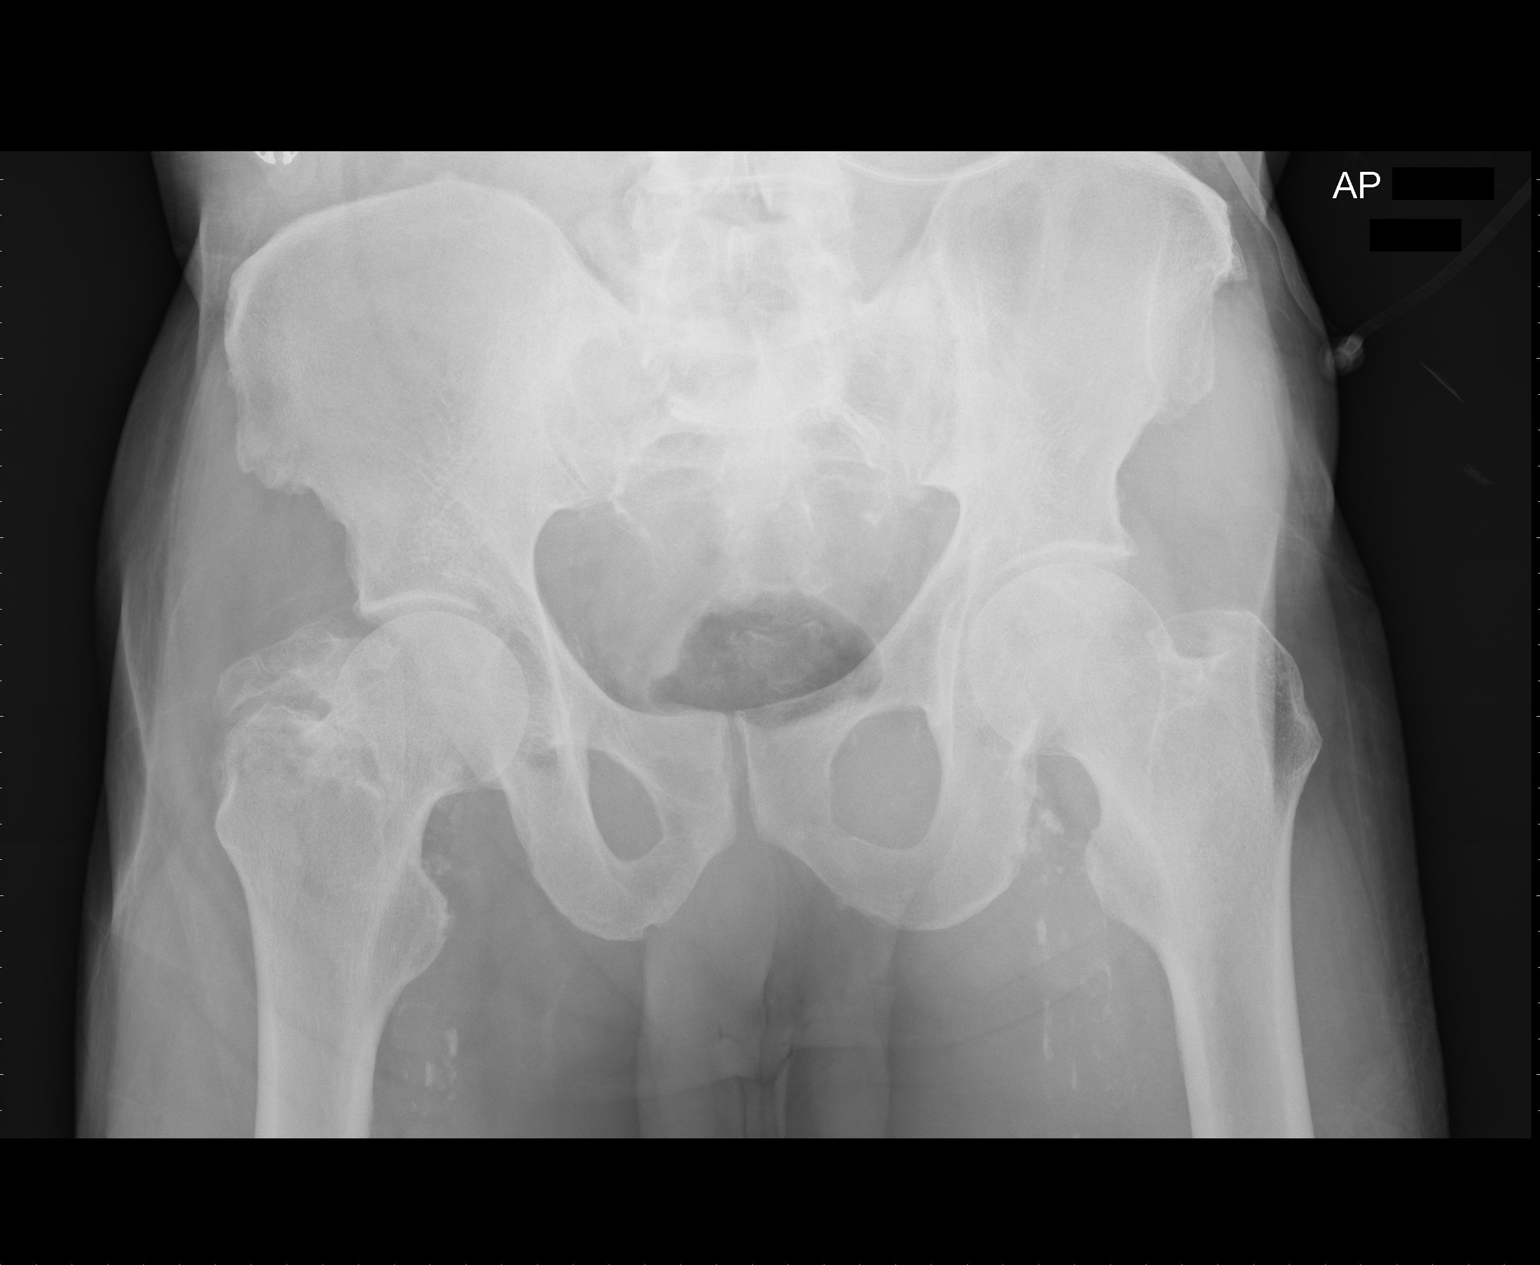

[1 of 1 positions shown; findings below may reference images not displayed]

FINDINGS: Degenerative changes lumbar spine and both hips. No acute bony or
joint abnormality. Heterotopic bone noted adjacent to the right hip.
Aortoiliac atherosclerotic vascular disease.
IMPRESSION: 1.  No acute or focal abnormality.  Degenerative changes both hips.

2. Peripheral vascular disease.
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
## 2017-10-27 ENCOUNTER — Encounter: Payer: Self-pay | Admitting: Family Medicine

## 2017-10-27 ENCOUNTER — Other Ambulatory Visit: Payer: Self-pay

## 2017-10-27 ENCOUNTER — Ambulatory Visit (INDEPENDENT_AMBULATORY_CARE_PROVIDER_SITE_OTHER): Payer: Medicare HMO | Admitting: Family Medicine

## 2017-10-27 VITALS — BP 136/60 | HR 105 | Temp 98.5°F | Wt 147.0 lb

## 2017-10-27 DIAGNOSIS — I10 Essential (primary) hypertension: Secondary | ICD-10-CM

## 2017-10-27 DIAGNOSIS — F17200 Nicotine dependence, unspecified, uncomplicated: Secondary | ICD-10-CM

## 2017-10-27 DIAGNOSIS — J449 Chronic obstructive pulmonary disease, unspecified: Secondary | ICD-10-CM | POA: Diagnosis not present

## 2017-10-27 DIAGNOSIS — Z122 Encounter for screening for malignant neoplasm of respiratory organs: Secondary | ICD-10-CM

## 2017-10-27 DIAGNOSIS — Z23 Encounter for immunization: Secondary | ICD-10-CM

## 2017-10-27 MED ORDER — LOSARTAN POTASSIUM-HCTZ 50-12.5 MG PO TABS: 1.0000 | ORAL_TABLET | Freq: Every day | ORAL | 3 refills | Status: DC

## 2017-10-27 MED ORDER — ALBUTEROL SULFATE HFA 108 (90 BASE) MCG/ACT IN AERS: INHALATION_SPRAY | RESPIRATORY_TRACT | 6 refills | Status: DC

## 2017-10-27 MED ORDER — ROSUVASTATIN CALCIUM 20 MG PO TABS: 20.0000 mg | ORAL_TABLET | Freq: Every day | ORAL | 3 refills | Status: DC

## 2017-10-27 NOTE — Assessment & Plan Note (Signed)
  Refilled albuterol inhaler. Patient may need to upgrade from Spiriva to Incruse, will review formulary. For now patient has refills on spiriva. Encouraged tobacco cessation as this is the only thing to keep from COPD worsening.

## 2017-10-27 NOTE — Assessment & Plan Note (Signed)
  Patient unwilling to quit. Will continue to encourage cessation. He is due for his yearly annual low dose chest CT. This is ordered however he is refusing to do this until July. Will bring up again at next visit.

## 2017-10-27 NOTE — Assessment & Plan Note (Signed)
  Chronic, well controlled at goal today <140/90 -continue current medication, hyzaar refilled

## 2017-10-27 NOTE — Progress Notes (Signed)
    Subjective:    Patient ID: Lucas [name], male    DOB: [birth year], 67 y.o.   MRN: [id number]   CC: needs med refills  He reports he is doing well. Taking all medications as prescribed and can list them. He is currently smoking and has no desire to quit although he has tried in the past. He has been smoking since age 29. He endorses frequent coughing, no fevers or chills, no sputum production. He uses 2 albuterol inhalers a month. Uses spiriva daily. He denies CP. Has SOB with activity. Denies lightheadedness.  Smoking status reviewed- current every day smoker  Review of Systems- see HPI   Objective:  BP 136/60   Pulse (!) 105   Temp 98.5 F (36.9 C) (Oral)   Wt 147 lb (66.7 kg)   SpO2 94%   BMI 20.50 kg/m  Vitals and nursing note reviewed  General: well nourished, in no acute distress HEENT: normocephalic, MMM Neck: supple, non-tender, without lymphadenopathy Cardiac: RRR, clear S1 and S2, no murmurs, rubs, or gallops Respiratory: clear to auscultation bilaterally, no increased work of breathing Extremities: no edema or cyanosis. Skin: warm and dry, no rashes noted Neuro: alert and oriented, no focal deficits  Assessment & Plan:    COPD, mild (HCC)  Refilled albuterol inhaler. Patient may need to upgrade from Spiriva to Incruse, will review formulary. For now patient has refills on spiriva. Encouraged tobacco cessation as this is the only thing to keep from COPD worsening.  HTN (hypertension)  Chronic, well controlled at goal today <140/90 -continue current medication, hyzaar refilled  TOBACCO DEPENDENCE  Patient unwilling to quit. Will continue to encourage cessation. He is due for his yearly annual low dose chest CT. This is ordered however he is refusing to do this until July. Will bring up again at next visit.     Return in about 6 months (around 04/26/2018) for labs, HTN, med refills.   Lucila Maine, DO Family Medicine Resident PGY-2

## 2017-10-27 NOTE — Patient Instructions (Addendum)
It was great seeing you today! I'd like to see you every 6 months at least. Please come back in July and we'll check labs then.  Please go to the hospital for chest CT to make sure your lungs are okay with your smoking history. Quitting smoking is the single best thing you can do for your health.   If you have questions or concerns please do not hesitate to call at [phone].  Lucila Maine, DO PGY-2, [facility] 10/27/2017 3:51 PM    Steps to Quit Smoking Smoking tobacco can be bad for your health. It can also affect almost every organ in your body. Smoking puts you and people around you at risk for many serious long-lasting (chronic) diseases. Quitting smoking is hard, but it is one of the best things that you can do for your health. It is never too late to quit. What are the benefits of quitting smoking? When you quit smoking, you lower your risk for getting serious diseases and conditions. They can include:  Lung cancer or lung disease.  Heart disease.  Stroke.  Heart attack.  Not being able to have children (infertility).  Weak bones (osteoporosis) and broken bones (fractures).  If you have coughing, wheezing, and shortness of breath, those symptoms may get better when you quit. You may also get sick less often. If you are pregnant, quitting smoking can help to lower your chances of having a baby of low birth weight. What can I do to help me quit smoking? Talk with your doctor about what can help you quit smoking. Some things you can do (strategies) include:  Quitting smoking totally, instead of slowly cutting back how much you smoke over a period of time.  Going to in-person counseling. You are more likely to quit if you go to many counseling sessions.  Using resources and support systems, such as: ? Database administrator with a Social worker. ? Phone quitlines. ? Careers information officer. ? Support groups or group counseling. ? Text messaging  programs. ? Mobile phone apps or applications.  Taking medicines. Some of these medicines may have nicotine in them. If you are pregnant or breastfeeding, do not take any medicines to quit smoking unless your doctor says it is okay. Talk with your doctor about counseling or other things that can help you.  Talk with your doctor about using more than one strategy at the same time, such as taking medicines while you are also going to in-person counseling. This can help make quitting easier. What things can I do to make it easier to quit? Quitting smoking might feel very hard at first, but there is a lot that you can do to make it easier. Take these steps:  Talk to your family and friends. Ask them to support and encourage you.  Call phone quitlines, reach out to support groups, or work with a Social worker.  Ask people who smoke to not smoke around you.  Avoid places that make you want (trigger) to smoke, such as: ? Bars. ? Parties. ? Smoke-break areas at work.  Spend time with people who do not smoke.  Lower the stress in your life. Stress can make you want to smoke. Try these things to help your stress: ? Getting regular exercise. ? Deep-breathing exercises. ? Yoga. ? Meditating. ? Doing a body scan. To do this, close your eyes, focus on one area of your body at a time from head to toe, and notice which parts of your body  are tense. Try to relax the muscles in those areas.  Download or buy apps on your mobile phone or tablet that can help you stick to your quit plan. There are many free apps, such as QuitGuide from the [employer] Office manager for Disease Control and Prevention). You can find more support from smokefree.gov and other websites.  This information is not intended to replace advice given to you by your health care provider. Make sure you discuss any questions you have with your health care provider. Document Released: 08/06/2009 Document Revised: 06/07/2016 Document Reviewed:  02/24/2015 Elsevier Interactive Patient Education  2018 [employer].

## 2017-10-31 ENCOUNTER — Other Ambulatory Visit: Payer: Self-pay | Admitting: Family Medicine

## 2017-11-17 ENCOUNTER — Telehealth: Payer: Self-pay | Admitting: Family Medicine

## 2017-11-17 NOTE — Telephone Encounter (Signed)
pts insurance will not pay for Provential. He would like to back to his regular venti.  [facility] on [address]. Please advise

## 2017-11-20 ENCOUNTER — Other Ambulatory Visit: Payer: Self-pay | Admitting: *Deleted

## 2017-11-20 DIAGNOSIS — J449 Chronic obstructive pulmonary disease, unspecified: Secondary | ICD-10-CM

## 2017-11-20 MED ORDER — ALBUTEROL SULFATE HFA 108 (90 BASE) MCG/ACT IN AERS: INHALATION_SPRAY | RESPIRATORY_TRACT | 6 refills | Status: DC

## 2017-11-22 MED ORDER — ALBUTEROL SULFATE HFA 108 (90 BASE) MCG/ACT IN AERS: INHALATION_SPRAY | RESPIRATORY_TRACT | 6 refills | Status: DC

## 2017-11-22 NOTE — Telephone Encounter (Signed)
Medication resent to pharmacy but this time with a diagnosis code attached for medical justification purposes.  This was requested VIA fax from pharmacy. Trevon Strothers,CMA

## 2017-11-22 NOTE — Addendum Note (Signed)
Addended by: Valerie Roys on: 11/22/2017 10:32 AM   Modules accepted: Orders

## 2017-11-27 ENCOUNTER — Other Ambulatory Visit: Payer: Self-pay | Admitting: *Deleted

## 2017-11-27 ENCOUNTER — Other Ambulatory Visit: Payer: Self-pay | Admitting: Family Medicine

## 2017-11-27 MED ORDER — UMECLIDINIUM BROMIDE 62.5 MCG/INH IN AEPB: 1.0000 | INHALATION_SPRAY | Freq: Every day | RESPIRATORY_TRACT | 0 refills | Status: DC

## 2017-11-27 NOTE — Telephone Encounter (Signed)
Received fax from pharmacy stating that Spiriva is no longer covered for this patient and he will need an alternative sent into his pharmacy.  Renella Steig,CMA

## 2017-11-27 NOTE — Progress Notes (Signed)
  Sent in new prescription for inhaled LAMA- umeclidinium. Unsure if this would be covered by insurance. Asked them to call us if it is not covered.   Lucila Maine, DO PGY-2, [facility] 11/27/2017 11:40 AM

## 2017-12-08 ENCOUNTER — Emergency Department (HOSPITAL_COMMUNITY): Payer: Medicare HMO

## 2017-12-08 ENCOUNTER — Other Ambulatory Visit: Payer: Self-pay

## 2017-12-08 ENCOUNTER — Encounter (HOSPITAL_COMMUNITY): Payer: Self-pay

## 2017-12-08 ENCOUNTER — Emergency Department (HOSPITAL_COMMUNITY)
Admission: EM | Admit: 2017-12-08 | Discharge: 2017-12-08 | Disposition: A | Discharge status: Home / Self Care | Payer: Medicare HMO | Attending: Emergency Medicine | Admitting: Emergency Medicine

## 2017-12-08 DIAGNOSIS — R109 Unspecified abdominal pain: Secondary | ICD-10-CM | POA: Diagnosis not present

## 2017-12-08 DIAGNOSIS — Y929 Unspecified place or not applicable: Secondary | ICD-10-CM | POA: Diagnosis not present

## 2017-12-08 DIAGNOSIS — S4991XA Unspecified injury of right shoulder and upper arm, initial encounter: Secondary | ICD-10-CM | POA: Diagnosis present

## 2017-12-08 DIAGNOSIS — Y9383 Activity, rough housing and horseplay: Secondary | ICD-10-CM | POA: Diagnosis not present

## 2017-12-08 DIAGNOSIS — T148XXA Other injury of unspecified body region, initial encounter: Secondary | ICD-10-CM

## 2017-12-08 DIAGNOSIS — W19XXXA Unspecified fall, initial encounter: Secondary | ICD-10-CM

## 2017-12-08 DIAGNOSIS — F17210 Nicotine dependence, cigarettes, uncomplicated: Secondary | ICD-10-CM | POA: Diagnosis not present

## 2017-12-08 DIAGNOSIS — G9389 Other specified disorders of brain: Secondary | ICD-10-CM | POA: Diagnosis not present

## 2017-12-08 DIAGNOSIS — Y999 Unspecified external cause status: Secondary | ICD-10-CM | POA: Diagnosis not present

## 2017-12-08 DIAGNOSIS — J449 Chronic obstructive pulmonary disease, unspecified: Secondary | ICD-10-CM | POA: Insufficient documentation

## 2017-12-08 DIAGNOSIS — Z79899 Other long term (current) drug therapy: Secondary | ICD-10-CM | POA: Insufficient documentation

## 2017-12-08 DIAGNOSIS — Z7982 Long term (current) use of aspirin: Secondary | ICD-10-CM | POA: Diagnosis not present

## 2017-12-08 DIAGNOSIS — R0602 Shortness of breath: Secondary | ICD-10-CM | POA: Diagnosis not present

## 2017-12-08 DIAGNOSIS — S42001A Fracture of unspecified part of right clavicle, initial encounter for closed fracture: Secondary | ICD-10-CM | POA: Diagnosis not present

## 2017-12-08 DIAGNOSIS — Y92009 Unspecified place in unspecified non-institutional (private) residence as the place of occurrence of the external cause: Secondary | ICD-10-CM

## 2017-12-08 DIAGNOSIS — W1789XA Other fall from one level to another, initial encounter: Secondary | ICD-10-CM | POA: Diagnosis not present

## 2017-12-08 DIAGNOSIS — I10 Essential (primary) hypertension: Secondary | ICD-10-CM | POA: Insufficient documentation

## 2017-12-08 DIAGNOSIS — S2231XA Fracture of one rib, right side, initial encounter for closed fracture: Secondary | ICD-10-CM | POA: Diagnosis not present

## 2017-12-08 LAB — COMPREHENSIVE METABOLIC PANEL
ALBUMIN: 4.1 g/dL (ref 3.5–5.0)
ALK PHOS: 89 U/L (ref 38–126)
ALT: 59 U/L (ref 17–63)
AST: 63 U/L — ABNORMAL HIGH (ref 15–41)
Anion gap: 14 (ref 5–15)
BILIRUBIN TOTAL: 0.8 mg/dL (ref 0.3–1.2)
BUN: 7 mg/dL (ref 6–20)
CALCIUM: 9.6 mg/dL (ref 8.9–10.3)
CO2: 20 mmol/L — AB (ref 22–32)
CREATININE: 0.83 mg/dL (ref 0.61–1.24)
Chloride: 101 mmol/L (ref 101–111)
GFR calc Af Amer: >60 mL/min (ref >60)
GFR calc non Af Amer: >60 mL/min (ref >60)
GLUCOSE: 111 mg/dL — AB (ref 65–99)
Potassium: 3.5 mmol/L (ref 3.5–5.1)
SODIUM: 135 mmol/L (ref 135–145)
Total Protein: 7.8 g/dL (ref 6.5–8.1)

## 2017-12-08 LAB — URINALYSIS, ROUTINE W REFLEX MICROSCOPIC
Bilirubin Urine: NEGATIVE
GLUCOSE, UA: NEGATIVE mg/dL
Hgb urine dipstick: NEGATIVE
Ketones, ur: NEGATIVE mg/dL
LEUKOCYTES UA: NEGATIVE
Nitrite: NEGATIVE
PH: 6 (ref 5.0–8.0)
Protein, ur: NEGATIVE mg/dL
SPECIFIC GRAVITY, URINE: 1.012 (ref 1.005–1.030)

## 2017-12-08 LAB — CBC WITH DIFFERENTIAL/PLATELET
BASOS PCT: 0 %
Basophils Absolute: 0 10*3/uL (ref 0.0–0.1)
EOS ABS: 0 10*3/uL (ref 0.0–0.7)
Eosinophils Relative: 0 %
HEMATOCRIT: 39.8 % (ref 39.0–52.0)
HEMOGLOBIN: 13.7 g/dL (ref 13.0–17.0)
LYMPHS ABS: 0.9 10*3/uL (ref 0.7–4.0)
Lymphocytes Relative: 14 %
MCH: 31.6 pg (ref 26.0–34.0)
MCHC: 34.4 g/dL (ref 30.0–36.0)
MCV: 91.7 fL (ref 78.0–100.0)
Monocytes Absolute: 0.4 10*3/uL (ref 0.1–1.0)
Monocytes Relative: 6 %
NEUTROS ABS: 5.1 10*3/uL (ref 1.7–7.7)
NEUTROS PCT: 80 %
Platelets: 130 10*3/uL — ABNORMAL LOW (ref 150–400)
RBC: 4.34 MIL/uL (ref 4.22–5.81)
RDW: 12.5 % (ref 11.5–15.5)
WBC: 6.4 10*3/uL (ref 4.0–10.5)

## 2017-12-08 LAB — PROTIME-INR
INR: 0.94
Prothrombin Time: 12.5 seconds (ref 11.4–15.2)

## 2017-12-08 LAB — LIPASE, BLOOD: Lipase: 29 U/L (ref 11–51)

## 2017-12-08 MED ORDER — MORPHINE SULFATE (PF) 4 MG/ML IV SOLN
4.0000 mg | Freq: Once | INTRAVENOUS | Status: AC
  Administered 2017-12-08: 4 mg via INTRAVENOUS
  Filled 2017-12-08: qty 1

## 2017-12-08 MED ORDER — NAPROXEN 375 MG PO TABS: 375.0000 mg | ORAL_TABLET | Freq: Two times a day (BID) | ORAL | 0 refills | Status: DC

## 2017-12-08 MED ORDER — OXYCODONE-ACETAMINOPHEN 5-325 MG PO TABS
2.0000 | ORAL_TABLET | Freq: Once | ORAL | Status: AC
  Administered 2017-12-08: 2 via ORAL
  Filled 2017-12-08: qty 2

## 2017-12-08 MED ORDER — IPRATROPIUM-ALBUTEROL 0.5-2.5 (3) MG/3ML IN SOLN
3.0000 mL | Freq: Once | RESPIRATORY_TRACT | Status: AC
  Administered 2017-12-08: 3 mL via RESPIRATORY_TRACT
  Filled 2017-12-08: qty 3

## 2017-12-08 MED ORDER — ALBUTEROL SULFATE HFA 108 (90 BASE) MCG/ACT IN AERS: 1.0000 | INHALATION_SPRAY | Freq: Four times a day (QID) | RESPIRATORY_TRACT | 0 refills | Status: DC | PRN

## 2017-12-08 MED ORDER — OXYCODONE-ACETAMINOPHEN 5-325 MG PO TABS: 1.0000 | ORAL_TABLET | ORAL | 0 refills | Status: DC | PRN

## 2017-12-08 MED ORDER — DOCUSATE SODIUM 100 MG PO CAPS: 100.0000 mg | ORAL_CAPSULE | Freq: Two times a day (BID) | ORAL | 0 refills | Status: DC

## 2017-12-08 MED ORDER — IOPAMIDOL (ISOVUE-300) INJECTION 61%
INTRAVENOUS | Status: AC
  Administered 2017-12-08: 100 mL
  Filled 2017-12-08: qty 100

## 2017-12-08 NOTE — ED Notes (Signed)
Radiology at bedside for x ray

## 2017-12-08 NOTE — Discharge Instructions (Signed)
1.  Make an appointment to see your doctor at the beginning of the week. 2.  Take naproxen twice a day for the next 5 days.  Take 1-2 Percocet as needed for additional pain control.  Take Colace if you tend to get constipation as these medications can be constipating. 3.Put ice on the swollen area of your clavicle.  Make sure it is well wrapped so you do not injury or skin. 4.  You have a broken collarbone on the right and a broken rib on the right.  You have a small air pocket at the bottom of the lung on the left side.  This does not seem to be an area of injury.  This may very well be due to your COPD.  You must return to the emergency department if you have worsening shortness of breath, or develop pain on the left side of the chest.

## 2017-12-08 NOTE — ED Triage Notes (Signed)
Pt with history of unsteady gait, fell from his porch last night.  Pt presents to ED with obvious bony deformity over right clavicle.  Pain reported to right shoulder and right rib cage.  Pt has some abrasions to left side of his face.  He is alert and oriented upon arrival to ED.

## 2017-12-08 NOTE — ED Notes (Signed)
Pt aware that urine sample is needed.  

## 2017-12-08 NOTE — ED Notes (Signed)
ED Provider at bedside. 

## 2017-12-08 NOTE — ED Provider Notes (Signed)
[facility] EMERGENCY DEPARTMENT Provider Note   CSN: [id number] Arrival date & time: 12/08/17  3329     History   Chief Complaint Chief Complaint  Patient presents with  . Fall    HPI Lucas [name] is a 67 y.o. male.  HPI Patient fell off of his porch yesterday evening about 10 PM.  He reports he had been drinking some beer.  He and a friend were playing around and ended up falling off the porch.  He reports he landed on his right shoulder and back.  Last night the pain did not seem to severe.  During the night however it got much worse.  His wife reports at one point was in a lot of pain and seemed pale and so she threw water on him.  He reports this morning he has a large swelling on the upper part of the chest that hurts and he feels much more short of breath.  At baseline patient has COPD with shortness of breath but this is much worse than typical.  He does continue to smoke but has not smoked any this morning.  He reports he did scrape his head a little bit but does not think he was knocked out.  He denies headache.  He reports some pain at the back of his neck but no numbness or tingling into his arms.  No abdominal pain.  No weakness of the lower extremities or difficulty walking. Past Medical History:  Diagnosis Date  . Allergy   . COPD 12/21/2006  . Environmental allergies   . HYPERLIPIDEMIA 12/21/2006  . HYPERTENSION, BENIGN SYSTEMIC 12/21/2006  . TOBACCO DEPENDENCE 12/21/2006    Patient Active Problem List   Diagnosis Date Noted  . Tubular adenoma of colon 01/24/2017  . Healthcare maintenance 10/13/2016  . HTN (hypertension) 02/13/2013  . Ganglion cyst 01/03/2012  . PERIPHERAL NEUROPATHY 07/15/2010  . Alcohol abuse 05/14/2009  . DEFORMITY, FOOT NEC, CONGENITAL 08/22/2007  . ALLERGIC RHINITIS, SEASONAL 05/03/2007  . Hyperlipidemia 12/21/2006  . TOBACCO DEPENDENCE 12/21/2006  . COPD, mild ([place]) 12/21/2006    Past Surgical History:  Procedure  Laterality Date  . ANKLE SURGERY     left ankle  . APPLICATION OF WOUND VAC  01/06/2012   Procedure: APPLICATION OF WOUND VAC;  Surgeon: Meredith Pel, MD;  Location: [facility];  Service: Orthopedics;  Laterality: Left;  . APPLICATION OF WOUND VAC Right 10/17/2015   Procedure: APPLICATION OF WOUND VAC;  Surgeon: Leandrew Koyanagi, MD;  Location: [facility];  Service: Orthopedics;  Laterality: Right;  . EXTERNAL FIXATION LEG  01/06/2012   Procedure: EXTERNAL FIXATION LEG;  Surgeon: Meredith Pel, MD;  Location: [facility];  Service: Orthopedics;  Laterality: Left;  Smith-nephew external fixator set  . EXTERNAL FIXATION LEG Right 09/16/2015   Procedure: EXTERNAL FIXATION LEG/LARGE;  Surgeon: Mcarthur Rossetti, MD;  Location: [place];  Service: Orthopedics;  Laterality: Right;  . EXTERNAL FIXATION REMOVAL Right 09/22/2015   Procedure: REMOVAL EXTERNAL FIXATION LEG;  Surgeon: Mcarthur Rossetti, MD;  Location: [place];  Service: Orthopedics;  Laterality: Right;  . FASCIOTOMY  01/10/2012   Procedure: FASCIOTOMY;  Surgeon: Rozanna Box, MD;  Location: [facility];  Service: Orthopedics;  Laterality: Left;  status post fasciotomy with wound vac left calf; adjustment external fixator left tibia under fluoro; retention suture/wound vac placement left lateral calf wound  . FASCIOTOMY  01/06/2012   Procedure: FASCIOTOMY;  Surgeon: Meredith Pel, MD;  Location:  [facility];  Service: Orthopedics;  Laterality: Left;  Four compartment fasciotomy  . I&D EXTREMITY  01/12/2012   Procedure: IRRIGATION AND DEBRIDEMENT EXTREMITY;  Surgeon: Rozanna Box, MD;  Location: [facility];  Service: Orthopedics;  Laterality: Left;  LAYERD CLOSURE LEFT LEG WOUND  . I&D EXTREMITY Right 10/17/2015   Procedure: IRRIGATION AND DEBRIDEMENT EXTREMITY;  Surgeon: Leandrew Koyanagi, MD;  Location: [place];  Service: Orthopedics;  Laterality: Right;  . I&D EXTREMITY Right 10/20/2015   Procedure: REPEAT IRRIGATION AND DEBRIDEMENT EXTREMITY, right lower  leg;  Surgeon: Mcarthur Rossetti, MD;  Location: [place];  Service: Orthopedics;  Laterality: Right;  . KNEE SURGERY     left knee and right knee  . MASS EXCISION Left 09/22/2015   Procedure: EXCISION MASS;  Surgeon: Mcarthur Rossetti, MD;  Location: [facility];  Service: Orthopedics;  Laterality: Left;  . ORIF TIBIA PLATEAU  02/07/2012   Procedure: OPEN REDUCTION INTERNAL FIXATION (ORIF) TIBIAL PLATEAU;  Surgeon: Rozanna Box, MD;  Location: [facility];  Service: Orthopedics;  Laterality: Left;  ORIF LEFT TIBIAL PLATEAU/ REMOVE EXTERNAL FIXATION LEFT LEG  . ORIF TIBIA PLATEAU Right 09/22/2015   Procedure: REMOVAL OF EXTERNAL FIXATOR RIGHT LEG, OPEN REDUCTION INTERNAL FIXATION (ORIF) RIGHT TIBIAL PLATEAU, EXCISION SMALL MASS LEFT LEG;  Surgeon: Mcarthur Rossetti, MD;  Location: [place];  Service: Orthopedics;  Laterality: Right;  . SKIN SPLIT GRAFT Right 10/23/2015   Procedure: RIGHT GASTROC FLAP SPLIT THICKNESS SKIN GRAFT FROM RIGHT THING TO RIGHT LEG;  Surgeon: Irene Limbo, MD;  Location: [place];  Service: Plastics;  Laterality: Right;  . WRIST SURGERY     left       Home Medications    Prior to Admission medications   Medication Sig Start Date End Date Taking? Authorizing Provider  albuterol (PROVENTIL HFA;VENTOLIN HFA) 108 (90 Base) MCG/ACT inhaler INHALE 2 PUFFS INTO THE LUNGS EVERY 4 HOURS AS NEEDED FOR WHEEZING OR SHORTNESS OF BREATH 11/22/17  Yes Riccio, Angela C, DO  capsicum (ZOSTRIX) 0.075 % topical cream Apply 1 application topically as needed (leg pain).   Yes [provider]  ibuprofen (ADVIL,MOTRIN) 200 MG tablet Take 400 mg by mouth every 6 (six) hours as needed for mild pain.   Yes [provider]  losartan-hydrochlorothiazide (HYZAAR) 50-12.5 MG tablet Take 1 tablet by mouth daily. 10/27/17  Yes Riccio, Angela C, DO  rosuvastatin (CRESTOR) 20 MG tablet Take 1 tablet (20 mg total) by mouth daily. 10/27/17  Yes Riccio, Angela C, DO  umeclidinium bromide  (INCRUSE ELLIPTA) 62.5 MCG/INH AEPB Inhale 1 puff into the lungs daily. 11/27/17  Yes Riccio, Angela C, DO  albuterol (PROVENTIL HFA;VENTOLIN HFA) 108 (90 Base) MCG/ACT inhaler Inhale 1-2 puffs into the lungs every 6 (six) hours as needed for wheezing or shortness of breath. 12/08/17   Charlesetta Shanks, MD  aspirin EC 81 MG tablet Take 1 tablet (81 mg total) by mouth daily. Patient not taking: Reported on 03/26/2017 10/13/16   Katheren Shams, DO  docusate sodium (COLACE) 100 MG capsule Take 1 capsule (100 mg total) by mouth every 12 (twelve) hours. 12/08/17   Charlesetta Shanks, MD  naproxen (NAPROSYN) 375 MG tablet Take 1 tablet (375 mg total) by mouth 2 (two) times daily. 12/08/17   Charlesetta Shanks, MD  oxyCODONE-acetaminophen (PERCOCET) 5-325 MG tablet Take 1-2 tablets by mouth every 4 (four) hours as needed. 12/08/17   Charlesetta Shanks, MD  loratadine (CLARITIN) 10 MG tablet Take 1 tablet (10  mg total) by mouth daily. 03/04/11 01/03/12  Mariana Arn, MD    Family History Family History  Problem Relation Age of Onset  . Emphysema Father     Social History Social History   Tobacco Use  . Smoking status: Current Every Day Smoker    Packs/day: 0.50    Years: 51.00    Pack years: 25.50    Types: Cigarettes  . Smokeless tobacco: Former User    Types: Snuff, Sarina Ser    Quit date: 01/05/1965  Substance Use Topics  . Alcohol use: Yes    Alcohol/week: 1.8 - 2.4 oz    Types: 3 - 4 Cans of beer per week  . Drug use: No     Allergies   Ace inhibitors; Ampicillin; Penicillins; Codeine; and Lisinopril   Review of Systems Review of Systems 10 Systems reviewed and are negative for acute change except as noted in the HPI.   Physical Exam Updated Vital Signs BP 118/65   Pulse 82   Temp (!) 97.4 F (36.3 C) (Oral)   Resp 18   Ht 5\' 11"  (1.803 m)   Wt 68.9 kg (152 lb)   SpO2 94%   BMI 21.20 kg/m   Physical Exam  Constitutional: He is oriented to person, place, and time.  Patient is alert and  nontoxic.  He appears to be in moderate to severe pain.  Mild increased work of breathing.  HENT:  Superficial dried abrasions to the left temple.  Poor dentition.  No other facial swelling.  Normal range of motion of the jaw.  No difficulty handling secretions.  Eyes: EOM are normal.  Neck:  Patient endorses some moderate tenderness to the C-spine.  Anterior soft show swelling that is over the clavicle on the right and just to the base of the neck.  This area is very tender.  Cardiovascular:  Heart is regular.  No gross rub murmur gallop.  Pulmonary/Chest:  Anteriorly, breath sounds are auscultated bilaterally in the upper lobes.  Posterior auscultation very soft breath sounds bilaterally.  Patient has softball size swelling over the right clavicle.  Very tender.  Abdominal: Soft. He exhibits no distension. There is no tenderness.  Musculoskeletal:  No gross lower extremity deformities or pain.  Normal range of motion lower extremity's.  Pain with range of motion right upper extremity.  Neurological: He is alert and oriented to person, place, and time. No cranial nerve deficit. He exhibits normal muscle tone. Coordination normal.  Skin: Skin is warm and dry.     ED Treatments / Results  Labs (all labs ordered are listed, but only abnormal results are displayed) Labs Reviewed  CBC WITH DIFFERENTIAL/PLATELET - Abnormal; Notable for the following components:      Result Value   Platelets 130 (*)    All other components within normal limits  COMPREHENSIVE METABOLIC PANEL - Abnormal; Notable for the following components:   CO2 20 (*)    Glucose, Bld 111 (*)    AST 63 (*)    All other components within normal limits  LIPASE, BLOOD  PROTIME-INR  URINALYSIS, ROUTINE W REFLEX MICROSCOPIC    EKG  EKG Interpretation  Date/Time:  Friday December 08 2017 08:39:21 EST Ventricular Rate:  74 PR Interval:    QRS Duration: 100 QT Interval:  388 QTC Calculation: 431 R Axis:   86 Text  Interpretation:  Sinus rhythm Consider left atrial enlargement Borderline right axis deviation Left ventricular hypertrophy agree. o change from previous Confirmed by Merek Niu,  Jeannie Done [phone]) on 12/08/2017 8:42:26 AM       Radiology Ct Head Wo Contrast  Result Date: 12/08/2017 CLINICAL DATA:  Fall.  Trauma. EXAM: CT HEAD WITHOUT CONTRAST CT CERVICAL SPINE WITHOUT CONTRAST TECHNIQUE: Multidetector CT imaging of the head and cervical spine was performed following the standard protocol without intravenous contrast. Multiplanar CT image reconstructions of the cervical spine were also generated. COMPARISON:  CT 09/16/2015 FINDINGS: CT HEAD FINDINGS Brain: Chronic encephalomalacia throughout the occipital lobe bilaterally is stable and may be congenital. Mild chronic white matter changes bilaterally. Ventricle size normal. Negative for acute infarct.  Negative for hemorrhage or mass. Vascular: Negative for hyperdense vessel Skull: Negative Sinuses/Orbits: Negative Other: None CT CERVICAL SPINE FINDINGS Alignment: Mild retrolisthesis C6-7 Skull base and vertebrae: Negative for fracture Soft tissues and spinal canal: Negative Disc levels: Disc degeneration and spurring C5-6 and C6-7. Mild foraminal narrowing bilaterally at C5-6 and C6-7. Upper chest: Apical emphysema and scarring Other: None IMPRESSION: 1. No acute intracranial abnormality. Chronic encephalomalacia in the occipital lobes bilaterally symmetric and likely congenital 2. Negative for cervical spine fracture. Electronically Signed   By: Franchot Gallo M.D.   On: 12/08/2017 11:09   Ct Chest W Contrast  Result Date: 12/08/2017 CLINICAL DATA:  Abdomen and pelvis trauma, minor, blunt. Right-sided rib pain. Fall from porch last night. EXAM: CT CHEST, ABDOMEN, AND PELVIS WITH CONTRAST TECHNIQUE: Multidetector CT imaging of the chest, abdomen and pelvis was performed following the standard protocol during bolus administration of intravenous contrast.  CONTRAST:  165mL ISOVUE-300 IOPAMIDOL (ISOVUE-300) INJECTION 61% COMPARISON:  Chest x-ray earlier today. Abdomen and pelvis CT 09/16/2015. Chest CT 10/21/2016. FINDINGS: CT CHEST FINDINGS Cardiovascular: No cardiomegaly or pericardial effusion. Extensive atherosclerotic calcification along both coronary circulations. Aortic calcification. Mediastinum/Nodes: Negative for hematoma.  No pneumomediastinum. Lungs/Pleura: Negative for hemothorax. Peripheral crescentic gas in the posterior costophrenic sulcus on the left with neighboring mild airspace opacity. Atelectasis and neighboring aerated lung is first considered given the lack of osseous trauma on this side, but for the extent of gas would expect some lung markings internally. No bleb or paraseptal emphysema in this location in 2017. Trauma setting suggests this is a pneumatocele with neighboring hemorrhage or a tiny loculated pneumothorax. Musculoskeletal: Acute, comminuted right mid clavicle fracture without notable displacement. There are multiple remote right rib fractures that are healed. There is a lucency through the chronic fracture deformity of the lateral right third rib. There is also acute on chronic posterior right seventh rib fracture. These are marked on images. No acute left-sided rib fracture is seen. Negative thoracic spine. CT ABDOMEN PELVIS FINDINGS Hepatobiliary: No evidence of injury. Pancreas: Negative.  No evidence of injury. Spleen: Negative Adrenals/Urinary Tract: No evidence of injury. Hilar calcifications that appear arterial. Few cortical low densities that likely reflects cysts. Stomach/Bowel: No evidence of injury. Vascular/Lymphatic: Advanced atherosclerosis of the aorta and visceral branches. No acute vascular finding. No retroperitoneal hematoma. Reproductive: Symmetric prostate enlargement that is mild. Prostatic calcifications. Other: No ascites or pneumoperitoneum Musculoskeletal: Remote nonunited right femur greater trochanter  fracture. Remote bilateral femoral head osteonecrosis. These results were called by telephone at the time of interpretation on 12/08/2017 at 11:32 am to Dr. Charlesetta Shanks , who verbally acknowledged these results. IMPRESSION: 1. Nondisplaced right mid clavicle fracture. 2. Right lateral 3rd and posterior 7th rib fractures. There is a background of multiple remote and healed right rib fractures. 3. Tiny gas collection at the left base with neighboring lung opacity. In the setting of trauma  this is presumably a small pneumatocele or tiny loculated pneumothorax, but it is unusual that there is no visible neighboring osseous trauma. No paraseptal emphysema or bleb seen in this area on a 2017 chest CT. 4. No acute finding in the abdomen. 5. Aortic Atherosclerosis (ICD10-I70.0) that is extensive. Extensive coronary calcification. 6. Remote greater trochanter femur fracture on the right, nonunited. Remote AVN of the femoral heads. Electronically Signed   By: Monte Fantasia M.D.   On: 12/08/2017 11:38   Ct Cervical Spine Wo Contrast  Result Date: 12/08/2017 CLINICAL DATA:  Fall.  Trauma. EXAM: CT HEAD WITHOUT CONTRAST CT CERVICAL SPINE WITHOUT CONTRAST TECHNIQUE: Multidetector CT imaging of the head and cervical spine was performed following the standard protocol without intravenous contrast. Multiplanar CT image reconstructions of the cervical spine were also generated. COMPARISON:  CT 09/16/2015 FINDINGS: CT HEAD FINDINGS Brain: Chronic encephalomalacia throughout the occipital lobe bilaterally is stable and may be congenital. Mild chronic white matter changes bilaterally. Ventricle size normal. Negative for acute infarct.  Negative for hemorrhage or mass. Vascular: Negative for hyperdense vessel Skull: Negative Sinuses/Orbits: Negative Other: None CT CERVICAL SPINE FINDINGS Alignment: Mild retrolisthesis C6-7 Skull base and vertebrae: Negative for fracture Soft tissues and spinal canal: Negative Disc levels: Disc  degeneration and spurring C5-6 and C6-7. Mild foraminal narrowing bilaterally at C5-6 and C6-7. Upper chest: Apical emphysema and scarring Other: None IMPRESSION: 1. No acute intracranial abnormality. Chronic encephalomalacia in the occipital lobes bilaterally symmetric and likely congenital 2. Negative for cervical spine fracture. Electronically Signed   By: Franchot Gallo M.D.   On: 12/08/2017 11:09   Ct Abdomen Pelvis W Contrast  Result Date: 12/08/2017 CLINICAL DATA:  Abdomen and pelvis trauma, minor, blunt. Right-sided rib pain. Fall from porch last night. EXAM: CT CHEST, ABDOMEN, AND PELVIS WITH CONTRAST TECHNIQUE: Multidetector CT imaging of the chest, abdomen and pelvis was performed following the standard protocol during bolus administration of intravenous contrast. CONTRAST:  131mL ISOVUE-300 IOPAMIDOL (ISOVUE-300) INJECTION 61% COMPARISON:  Chest x-ray earlier today. Abdomen and pelvis CT 09/16/2015. Chest CT 10/21/2016. FINDINGS: CT CHEST FINDINGS Cardiovascular: No cardiomegaly or pericardial effusion. Extensive atherosclerotic calcification along both coronary circulations. Aortic calcification. Mediastinum/Nodes: Negative for hematoma.  No pneumomediastinum. Lungs/Pleura: Negative for hemothorax. Peripheral crescentic gas in the posterior costophrenic sulcus on the left with neighboring mild airspace opacity. Atelectasis and neighboring aerated lung is first considered given the lack of osseous trauma on this side, but for the extent of gas would expect some lung markings internally. No bleb or paraseptal emphysema in this location in 2017. Trauma setting suggests this is a pneumatocele with neighboring hemorrhage or a tiny loculated pneumothorax. Musculoskeletal: Acute, comminuted right mid clavicle fracture without notable displacement. There are multiple remote right rib fractures that are healed. There is a lucency through the chronic fracture deformity of the lateral right third rib. There is  also acute on chronic posterior right seventh rib fracture. These are marked on images. No acute left-sided rib fracture is seen. Negative thoracic spine. CT ABDOMEN PELVIS FINDINGS Hepatobiliary: No evidence of injury. Pancreas: Negative.  No evidence of injury. Spleen: Negative Adrenals/Urinary Tract: No evidence of injury. Hilar calcifications that appear arterial. Few cortical low densities that likely reflects cysts. Stomach/Bowel: No evidence of injury. Vascular/Lymphatic: Advanced atherosclerosis of the aorta and visceral branches. No acute vascular finding. No retroperitoneal hematoma. Reproductive: Symmetric prostate enlargement that is mild. Prostatic calcifications. Other: No ascites or pneumoperitoneum Musculoskeletal: Remote nonunited right femur greater trochanter fracture. Remote bilateral  femoral head osteonecrosis. These results were called by telephone at the time of interpretation on 12/08/2017 at 11:32 am to Dr. Charlesetta Shanks , who verbally acknowledged these results. IMPRESSION: 1. Nondisplaced right mid clavicle fracture. 2. Right lateral 3rd and posterior 7th rib fractures. There is a background of multiple remote and healed right rib fractures. 3. Tiny gas collection at the left base with neighboring lung opacity. In the setting of trauma this is presumably a small pneumatocele or tiny loculated pneumothorax, but it is unusual that there is no visible neighboring osseous trauma. No paraseptal emphysema or bleb seen in this area on a 2017 chest CT. 4. No acute finding in the abdomen. 5. Aortic Atherosclerosis (ICD10-I70.0) that is extensive. Extensive coronary calcification. 6. Remote greater trochanter femur fracture on the right, nonunited. Remote AVN of the femoral heads. Electronically Signed   By: Monte Fantasia M.D.   On: 12/08/2017 11:38   Dg Chest Port 1 View  Result Date: 12/08/2017 CLINICAL DATA:  Fall.  Chest pain.  Swelling over right clavicle. EXAM: PORTABLE CHEST 1 VIEW  COMPARISON:  04/22/2017. FINDINGS: Slight displaced fracture of the distal clavicle is noted. No evidence of dislocation. Old right rib fracture scratched it old healed right rib fractures. Stable right-sided pleural thickening. No acute cardiopulmonary disease. No evidence of pneumothorax. IMPRESSION: 1.  Slightly displaced fracture of the right clavicle. 2.  Old right rib fractures.  Right pleural scarring. Electronically Signed   By: Marcello Moores  Register   On: 12/08/2017 09:03    Procedures Procedures (including critical care time)  Medications Ordered in ED Medications  morphine 4 MG/ML injection 4 mg (4 mg Intravenous Given 12/08/17 1029)  ipratropium-albuterol (DUONEB) 0.5-2.5 (3) MG/3ML nebulizer solution 3 mL (3 mLs Nebulization Given 12/08/17 1030)  iopamidol (ISOVUE-300) 61 % injection (100 mLs  Contrast Given 12/08/17 1043)  morphine 4 MG/ML injection 4 mg (4 mg Intravenous Given 12/08/17 1227)  ipratropium-albuterol (DUONEB) 0.5-2.5 (3) MG/3ML nebulizer solution 3 mL (3 mLs Nebulization Given 12/08/17 1230)  oxyCODONE-acetaminophen (PERCOCET/ROXICET) 5-325 MG per tablet 2 tablet (2 tablets Oral Given 12/08/17 1404)     Initial Impression / Assessment and Plan / ED Course  I have reviewed the triage vital signs and the nursing notes.  Pertinent labs & imaging results that were available during my care of the patient were reviewed by me and considered in my medical decision making (see chart for details).    Chest x-ray reviewed at bedside (09: 05) no tension pneumothorax.  Questionable smaller pneumothorax with blunting of right costophrenic angle.  Positive clavicle fracture.  Old rib fractures possibly acute as well.  We will proceed with CT scanning.  Await radiology reading on portable chest x-ray.  Will administer DuoNeb, patient has known severe COPD may also be contributing to shortness of breath as well as traumatic injury.  Consult: Reviewed with Dr. Hulen Skains.  At this time, with  patient stable and adequately pain controlled, plan to discharge with outpatient follow-up with PCP.  Final Clinical Impressions(s) / ED Diagnoses   Final diagnoses:  Fall in home, initial encounter  Closed nondisplaced fracture of right clavicle, unspecified part of clavicle, initial encounter  Closed fracture of one rib of right side, initial encounter  Abrasion   Patient fell yesterday as outlined above.  No acute pneumothorax identified on the right side.  Once patient was pain controlled and treated with DuoNeb, shortness of breath much improved.  Patient is at baseline now.  Incidental air pocket identified on  the left.  Patient has no pain on the side.  Have much higher suspicion for COPD\emphysema bleb rupture.  At final evaluation patient is alert, no respiratory distress and pain control.  We will plan to discharge with Percocet and naproxen.  Return precautions reviewed.  Patient made aware of "air pocket" finding on the left.  Patient has been a chronic smoker.  He is again counseled on the importance of quitting. ED Discharge Orders        Ordered    oxyCODONE-acetaminophen (PERCOCET) 5-325 MG tablet  Every 4 hours PRN     12/08/17 1528    albuterol (PROVENTIL HFA;VENTOLIN HFA) 108 (90 Base) MCG/ACT inhaler  Every 6 hours PRN     12/08/17 1528    docusate sodium (COLACE) 100 MG capsule  Every 12 hours     12/08/17 1528    naproxen (NAPROSYN) 375 MG tablet  2 times daily     12/08/17 [place], MD 12/08/17 1542

## 2017-12-12 ENCOUNTER — Ambulatory Visit (INDEPENDENT_AMBULATORY_CARE_PROVIDER_SITE_OTHER): Payer: Medicare HMO | Admitting: Family Medicine

## 2017-12-12 VITALS — BP 123/80 | HR 89 | Temp 97.7°F | Ht 71.0 in | Wt 150.8 lb

## 2017-12-12 DIAGNOSIS — W19XXXD Unspecified fall, subsequent encounter: Secondary | ICD-10-CM

## 2017-12-12 DIAGNOSIS — R0789 Other chest pain: Secondary | ICD-10-CM

## 2017-12-12 NOTE — Patient Instructions (Signed)
   It was great seeing you today! I hope you're feeling better soon.  Make sure to take deep breaths.  Please return to be seen if your symptoms worsen or if you develop shortness of breath.   If you have questions or concerns please do not hesitate to call at [phone].  Lucila Maine, DO PGY-2, [facility] 12/12/2017 5:01 PM

## 2017-12-12 NOTE — Progress Notes (Signed)
    Subjective:    Patient ID: Lucas [name], male    DOB: 03/21/1951, 67 y.o.   MRN: [id number]   CC: follow up after fall  Patient presents for ED follow up after fall on 2/14. He went to ED 2/15 for chest pain after fall. Lost balance, fell off porch. Friend tried to catch him but friend fell on top of him. In ED he was found to have mid clavicular fx w/ mild displacement and rib 3 and 7 fractures. He was given naproxen and oxycodone. He is using oxycodone sparingly mostly at night. He states his breathing is ok. He initially had some blood flecked in his sputum but that has resolved. He has a sling to wear for his broken collarbone.   Smoking status reviewed- current every day smoker  Review of Systems- see HPI   Objective:  BP 123/80 (BP Location: Left Arm, Patient Position: Sitting, Cuff Size: Normal)   Pulse 89   Temp 97.7 F (36.5 C) (Oral)   Ht 5\' 11"  (1.803 m)   Wt 150 lb 12.8 oz (68.4 kg)   SpO2 97%   BMI 21.03 kg/m  Vitals and nursing note reviewed  General: well nourished, in no acute distress Cardiac: RRR, clear S1 and S2, no murmurs, rubs, or gallops Respiratory: clear to auscultation bilaterally, no increased work of breathing Skin: warm and dry, no rashes noted Neuro: alert and oriented, no focal deficits  Assessment & Plan:   1. Fall, subsequent encounter Patient appears to be healing well. He is not having issues breathing. Pain is well controlled. Asked him to return to be seen if he experiences SOB or coughs up blood. He agreed.   Of note, it may be worthwhile to obtain a DEXA scan in this patient due to multiple fractures seen on imaging and extensive smoking history. Will discuss at next visit.  Return in about 2 months (around 02/09/2018) for COPD.   Lucila Maine, DO Family Medicine Resident PGY-2

## 2017-12-24 ENCOUNTER — Other Ambulatory Visit: Payer: Self-pay | Admitting: Family Medicine

## 2018-01-09 ENCOUNTER — Telehealth: Payer: Self-pay | Admitting: *Deleted

## 2018-01-09 DIAGNOSIS — I10 Essential (primary) hypertension: Secondary | ICD-10-CM

## 2018-01-09 NOTE — Telephone Encounter (Signed)
Fax received from pharmacy and patient's losartan is on back order.  They would like an alternative sent in and they suggested: telmisartan-hctz 40-12.5mg , candesartan-hctz 16-12.5mg , valsartan-hctz 160-12.5mg  and irbesartan-hctz 150-12.5mg .  Will forward to MD. Johnney Ou

## 2018-01-12 MED ORDER — CANDESARTAN CILEXETIL-HCTZ 16-12.5 MG PO TABS: 1.0000 | ORAL_TABLET | Freq: Every day | ORAL | 0 refills | Status: DC

## 2018-01-12 NOTE — Telephone Encounter (Signed)
Spoke with MD covering Dr. Stefano Gaul box and medication approved and sent to pharmacy.  Patient is aware and will plan to see Dr. Vanetta Shawl in April to follow up on his blood pressure. Lucas [name],CMA

## 2018-02-07 ENCOUNTER — Ambulatory Visit (INDEPENDENT_AMBULATORY_CARE_PROVIDER_SITE_OTHER): Payer: Medicare HMO | Admitting: Family Medicine

## 2018-02-07 ENCOUNTER — Encounter: Payer: Self-pay | Admitting: Family Medicine

## 2018-02-07 ENCOUNTER — Other Ambulatory Visit: Payer: Self-pay

## 2018-02-07 VITALS — BP 118/64 | HR 109 | Temp 97.8°F | Ht 71.0 in | Wt 149.0 lb

## 2018-02-07 DIAGNOSIS — I10 Essential (primary) hypertension: Secondary | ICD-10-CM | POA: Diagnosis not present

## 2018-02-07 DIAGNOSIS — T07XXXA Unspecified multiple injuries, initial encounter: Secondary | ICD-10-CM | POA: Diagnosis not present

## 2018-02-07 DIAGNOSIS — J449 Chronic obstructive pulmonary disease, unspecified: Secondary | ICD-10-CM

## 2018-02-07 MED ORDER — CANDESARTAN CILEXETIL-HCTZ 16-12.5 MG PO TABS: 1.0000 | ORAL_TABLET | Freq: Every day | ORAL | 3 refills | Status: DC

## 2018-02-07 MED ORDER — CETIRIZINE HCL 10 MG PO TABS: 10.0000 mg | ORAL_TABLET | Freq: Every day | ORAL | 11 refills | Status: DC

## 2018-02-07 MED ORDER — ALBUTEROL SULFATE HFA 108 (90 BASE) MCG/ACT IN AERS: INHALATION_SPRAY | RESPIRATORY_TRACT | 6 refills | Status: DC

## 2018-02-07 NOTE — Progress Notes (Signed)
    Subjective:    Patient ID: Lucas [name], male    DOB: Mar 22, 1951, 67 y.o.   MRN: [id number]   CC: follow up COPD  He has been using incruse inhaler and albuterol inhaler. COPD is worse with allergy season. He does not take allergy medication. He reports good results with new incruse. Had to switch inhalers due to insurance stipulations. He uses albuterol as needed throughout day maybe 1-2 times. He needs refill on albuterol.   He feels well since fall and fracture. No pain or complaints.  Smoking status reviewed-current every day smoker  Review of Systems- denies chest pain, SOB, wheezing. Endorses DOE.   Objective:  BP 118/64   Pulse (!) 109   Temp 97.8 F (36.6 C) (Oral)   Ht 5\' 11"  (1.803 m)   Wt 149 lb (67.6 kg)   SpO2 97%   BMI 20.78 kg/m  Vitals and nursing note reviewed  General: elderly frail gentleman in NAD  Cardiac: RRR, clear S1 and S2, no murmurs, rubs, or gallops Respiratory: clear to auscultation bilaterally, no increased work of breathing Extremities: no edema or cyanosis. Skin: warm and dry, no rashes noted Neuro: alert and oriented, no focal deficits   Assessment & Plan:    1. COPD, mild ([place]) Doing well. Encouraged patient to use allergy medication to decrease triggers for COPD. Sent in antihistamine to pharmacy. Refilled albuterol. Follow up 3 months.  - albuterol (PROVENTIL HFA;VENTOLIN HFA) 108 (90 Base) MCG/ACT inhaler; INHALE 2 PUFFS INTO THE LUNGS EVERY 4 HOURS AS NEEDED FOR WHEEZING OR SHORTNESS OF BREATH  Dispense: 18 g; Refill: 6  2. Essential hypertension Needs refill on BP medications. BP at goal today <140/90 - candesartan-hydrochlorothiazide (ATACAND HCT) 16-12.5 MG tablet; Take 1 tablet by mouth daily.  Dispense: 90 tablet; Refill: 3  3. Multiple fractures Patient with multiple fractures, would like to assess for osteoporosis. DEXA ordered. - DG Bone Density; Future   Return in about 3 months (around 05/09/2018).   Lucila Maine, DO Family Medicine Resident PGY-2

## 2018-02-07 NOTE — Patient Instructions (Signed)
  It was great to see you today!  Please continue taking your medications and using the inhalers.  I sent in an antihistamine for you to take every night to help with allergies.  Please get your bone scan done to look for thinning of your bones.   If you have questions or concerns please do not hesitate to call at [phone].  Lucila Maine, DO PGY-2, [facility] 02/07/2018 3:49 PM

## 2018-02-26 ENCOUNTER — Telehealth: Payer: Self-pay | Admitting: Family Medicine

## 2018-02-26 DIAGNOSIS — M810 Age-related osteoporosis without current pathological fracture: Secondary | ICD-10-CM

## 2018-02-26 DIAGNOSIS — Z7951 Long term (current) use of inhaled steroids: Secondary | ICD-10-CM

## 2018-02-26 NOTE — Telephone Encounter (Signed)
[facility] call because they need different orders for the patient's insurance to cover the bone density test. Please call to discuss and place new referral for this. [email or web address]

## 2018-02-26 NOTE — Telephone Encounter (Signed)
Will forward to MD.  Notes places on order that a new diagnosis code is needed for dexa scan as the one provided is not covered.  Lucas [name],CMA

## 2018-02-28 NOTE — Telephone Encounter (Signed)
Changed order. Thanks!

## 2018-03-01 NOTE — Telephone Encounter (Signed)
Tried calling patient to let him know that bone density scan is able to be scheduled now, but there was no answer or voicemail available.  Will continue to try and reach him.  Jazmin Hartsell,CMA

## 2018-03-01 NOTE — Telephone Encounter (Signed)
I spoke with [facility] and osteoporosis cannot be used as the initial reason for the exam.  Patient would need to have been on long term steroids, can use osteopenia and/or have had cervical fractures.  Jasha Hodzic,CMA

## 2018-03-01 NOTE — Telephone Encounter (Signed)
  Ok. He has multiple fractures on imaging of ribs, hips. No cervical fractures. He has long term use of inhaled corticosteroids. Changed order association.  Thanks

## 2018-05-22 ENCOUNTER — Other Ambulatory Visit: Payer: Self-pay | Admitting: Family Medicine

## 2018-05-23 ENCOUNTER — Other Ambulatory Visit: Payer: Self-pay

## 2018-05-23 ENCOUNTER — Ambulatory Visit (INDEPENDENT_AMBULATORY_CARE_PROVIDER_SITE_OTHER): Payer: Medicare HMO | Admitting: Family Medicine

## 2018-05-23 ENCOUNTER — Encounter: Payer: Self-pay | Admitting: Family Medicine

## 2018-05-23 VITALS — BP 124/64 | Temp 98.2°F | Ht 71.0 in | Wt 149.0 lb

## 2018-05-23 DIAGNOSIS — J449 Chronic obstructive pulmonary disease, unspecified: Secondary | ICD-10-CM

## 2018-05-23 DIAGNOSIS — F17200 Nicotine dependence, unspecified, uncomplicated: Secondary | ICD-10-CM

## 2018-05-23 MED ORDER — UMECLIDINIUM BROMIDE 62.5 MCG/INH IN AEPB: 1.0000 | INHALATION_SPRAY | Freq: Every day | RESPIRATORY_TRACT | 3 refills | Status: DC

## 2018-05-23 MED ORDER — MONTELUKAST SODIUM 10 MG PO TABS: 10.0000 mg | ORAL_TABLET | Freq: Every day | ORAL | 3 refills | Status: DC

## 2018-05-23 NOTE — Patient Instructions (Signed)
Good to see you today! Continue taking the allergy medication. I have sent in singulair as well to your pharmacy which I hope will help reduce breathing troubles.  Quitting smoking is the most important thing you can do to help your breathing.  I put information below to help you quit.  If you have questions or concerns please do not hesitate to call at [phone].  Lucila Maine, DO PGY-2, [facility] 05/23/2018 4:01 PM  Steps to Quit Smoking Smoking tobacco can be bad for your health. It can also affect almost every organ in your body. Smoking puts you and people around you at risk for many serious long-lasting (chronic) diseases. Quitting smoking is hard, but it is one of the best things that you can do for your health. It is never too late to quit. What are the benefits of quitting smoking? When you quit smoking, you lower your risk for getting serious diseases and conditions. They can include:  Lung cancer or lung disease.  Heart disease.  Stroke.  Heart attack.  Not being able to have children (infertility).  Weak bones (osteoporosis) and broken bones (fractures).  If you have coughing, wheezing, and shortness of breath, those symptoms may get better when you quit. You may also get sick less often. If you are pregnant, quitting smoking can help to lower your chances of having a baby of low birth weight. What can I do to help me quit smoking? Talk with your doctor about what can help you quit smoking. Some things you can do (strategies) include:  Quitting smoking totally, instead of slowly cutting back how much you smoke over a period of time.  Going to in-person counseling. You are more likely to quit if you go to many counseling sessions.  Using resources and support systems, such as: ? Database administrator with a Social worker. ? Phone quitlines. ? Careers information officer. ? Support groups or group counseling. ? Text messaging programs. ? Mobile phone  apps or applications.  Taking medicines. Some of these medicines may have nicotine in them. If you are pregnant or breastfeeding, do not take any medicines to quit smoking unless your doctor says it is okay. Talk with your doctor about counseling or other things that can help you.  Talk with your doctor about using more than one strategy at the same time, such as taking medicines while you are also going to in-person counseling. This can help make quitting easier. What things can I do to make it easier to quit? Quitting smoking might feel very hard at first, but there is a lot that you can do to make it easier. Take these steps:  Talk to your family and friends. Ask them to support and encourage you.  Call phone quitlines, reach out to support groups, or work with a Social worker.  Ask people who smoke to not smoke around you.  Avoid places that make you want (trigger) to smoke, such as: ? Bars. ? Parties. ? Smoke-break areas at work.  Spend time with people who do not smoke.  Lower the stress in your life. Stress can make you want to smoke. Try these things to help your stress: ? Getting regular exercise. ? Deep-breathing exercises. ? Yoga. ? Meditating. ? Doing a body scan. To do this, close your eyes, focus on one area of your body at a time from head to toe, and notice which parts of your body are tense. Try to relax the muscles in those areas.  Download or buy apps on your mobile phone or tablet that can help you stick to your quit plan. There are many free apps, such as QuitGuide from the [employer] Office manager for Disease Control and Prevention). You can find more support from smokefree.gov and other websites.  This information is not intended to replace advice given to you by your health care provider. Make sure you discuss any questions you have with your health care provider. Document Released: 08/06/2009 Document Revised: 06/07/2016 Document Reviewed: 02/24/2015 Elsevier Interactive  Patient Education  2018 [employer].

## 2018-05-23 NOTE — Assessment & Plan Note (Signed)
  Emphasized the importance of quitting. Gave information on how to quit. Will continue to address at future visits.

## 2018-05-23 NOTE — Progress Notes (Signed)
    Subjective:    Patient ID: Lucas [name], male    DOB: [birth year], 67 y.o.   MRN: [id number]   CC: COPD follow up  Needs refill on incruse 62.5 mg Smoking 1/2 pack a day. Reports he has tried nicotine patches before to quit but had no success. He is ambivalent about quitting. He denies that smoking has a negative effect on his breathing Allergies are his biggest trigger. He reports when he is more congested with allergies he feels his breathing is much worse.   He forgot to get DEXA done. He states he will schedule this.  Smoking status reviewed- current every day smoker 1/2 ppd  Review of Systems-denies SOB, CP, wheezing, fevers, chills. Endorses DOE.   Objective:  BP 124/64   Temp 98.2 F (36.8 C) (Oral)   Ht 5\' 11"  (1.803 m)   Wt 149 lb (67.6 kg)   BMI 20.78 kg/m  Vitals and nursing note reviewed  General: frail elderly AA male in NAD HEENT: normocephalic, MMM Cardiac: RRR, clear S1 and S2, no murmurs, rubs, or gallops Respiratory: clear to auscultation bilaterally, no increased work of breathing Extremities: no edema or cyanosis Neuro: alert and oriented, no focal deficits   Assessment & Plan:    COPD, mild ([facility])  No wheezing on exam. Refilled incruse. Added singulair to help with allergy component of triggers. Follow up as needed for breathing problems. Patient is well controlled on current regimen.  TOBACCO DEPENDENCE  Emphasized the importance of quitting. Gave information on how to quit. Will continue to address at future visits.    Return in about 6 months (around 11/23/2018).   Lucila Maine, DO Family Medicine Resident PGY-2

## 2018-05-23 NOTE — Assessment & Plan Note (Signed)
  No wheezing on exam. Refilled incruse. Added singulair to help with allergy component of triggers. Follow up as needed for breathing problems. Patient is well controlled on current regimen.

## 2018-06-21 ENCOUNTER — Inpatient Hospital Stay (HOSPITAL_COMMUNITY): Payer: Medicare HMO

## 2018-06-21 ENCOUNTER — Other Ambulatory Visit: Payer: Self-pay

## 2018-06-21 ENCOUNTER — Emergency Department (HOSPITAL_COMMUNITY): Payer: Medicare HMO

## 2018-06-21 ENCOUNTER — Inpatient Hospital Stay (HOSPITAL_COMMUNITY)
Admission: EM | Admit: 2018-06-21 | Discharge: 2018-06-23 | DRG: 369 | Disposition: A | Discharge status: Home / Self Care | Payer: Medicare HMO | Attending: Family Medicine | Admitting: Family Medicine

## 2018-06-21 ENCOUNTER — Encounter (HOSPITAL_COMMUNITY): Payer: Self-pay | Admitting: Emergency Medicine

## 2018-06-21 DIAGNOSIS — Z791 Long term (current) use of non-steroidal anti-inflammatories (NSAID): Secondary | ICD-10-CM | POA: Diagnosis not present

## 2018-06-21 DIAGNOSIS — Z825 Family history of asthma and other chronic lower respiratory diseases: Secondary | ICD-10-CM | POA: Diagnosis not present

## 2018-06-21 DIAGNOSIS — E785 Hyperlipidemia, unspecified: Secondary | ICD-10-CM | POA: Diagnosis present

## 2018-06-21 DIAGNOSIS — K2990 Gastroduodenitis, unspecified, without bleeding: Secondary | ICD-10-CM | POA: Diagnosis not present

## 2018-06-21 DIAGNOSIS — K922 Gastrointestinal hemorrhage, unspecified: Secondary | ICD-10-CM | POA: Diagnosis not present

## 2018-06-21 DIAGNOSIS — Z7982 Long term (current) use of aspirin: Secondary | ICD-10-CM

## 2018-06-21 DIAGNOSIS — D62 Acute posthemorrhagic anemia: Secondary | ICD-10-CM | POA: Diagnosis present

## 2018-06-21 DIAGNOSIS — I248 Other forms of acute ischemic heart disease: Secondary | ICD-10-CM | POA: Diagnosis present

## 2018-06-21 DIAGNOSIS — J449 Chronic obstructive pulmonary disease, unspecified: Secondary | ICD-10-CM | POA: Diagnosis present

## 2018-06-21 DIAGNOSIS — K7460 Unspecified cirrhosis of liver: Secondary | ICD-10-CM

## 2018-06-21 DIAGNOSIS — F17210 Nicotine dependence, cigarettes, uncomplicated: Secondary | ICD-10-CM | POA: Diagnosis present

## 2018-06-21 DIAGNOSIS — K3189 Other diseases of stomach and duodenum: Secondary | ICD-10-CM | POA: Diagnosis present

## 2018-06-21 DIAGNOSIS — K2970 Gastritis, unspecified, without bleeding: Secondary | ICD-10-CM | POA: Diagnosis present

## 2018-06-21 DIAGNOSIS — F1010 Alcohol abuse, uncomplicated: Secondary | ICD-10-CM | POA: Diagnosis not present

## 2018-06-21 DIAGNOSIS — F1020 Alcohol dependence, uncomplicated: Secondary | ICD-10-CM | POA: Diagnosis present

## 2018-06-21 DIAGNOSIS — I10 Essential (primary) hypertension: Secondary | ICD-10-CM | POA: Diagnosis present

## 2018-06-21 DIAGNOSIS — K219 Gastro-esophageal reflux disease without esophagitis: Secondary | ICD-10-CM | POA: Diagnosis present

## 2018-06-21 DIAGNOSIS — K7030 Alcoholic cirrhosis of liver without ascites: Secondary | ICD-10-CM | POA: Diagnosis not present

## 2018-06-21 DIAGNOSIS — K920 Hematemesis: Secondary | ICD-10-CM

## 2018-06-21 DIAGNOSIS — B9681 Helicobacter pylori [H. pylori] as the cause of diseases classified elsewhere: Secondary | ICD-10-CM | POA: Diagnosis not present

## 2018-06-21 DIAGNOSIS — K449 Diaphragmatic hernia without obstruction or gangrene: Secondary | ICD-10-CM | POA: Diagnosis present

## 2018-06-21 DIAGNOSIS — E871 Hypo-osmolality and hyponatremia: Secondary | ICD-10-CM | POA: Diagnosis present

## 2018-06-21 DIAGNOSIS — K226 Gastro-esophageal laceration-hemorrhage syndrome: Principal | ICD-10-CM | POA: Diagnosis present

## 2018-06-21 HISTORY — DX: Gastrointestinal hemorrhage, unspecified: K92.2

## 2018-06-21 HISTORY — DX: Acute posthemorrhagic anemia: D62

## 2018-06-21 LAB — TYPE AND SCREEN
ABO/RH(D): O POS
ANTIBODY SCREEN: NEGATIVE

## 2018-06-21 LAB — CBC
HCT: 32.1 % — ABNORMAL LOW (ref 39.0–52.0)
HCT: 23.7 % — ABNORMAL LOW (ref 39.0–52.0)
HEMATOCRIT: 25.5 % — AB (ref 39.0–52.0)
HEMATOCRIT: 29.6 % — AB (ref 39.0–52.0)
HEMOGLOBIN: 8.1 g/dL — AB (ref 13.0–17.0)
Hemoglobin: 11 g/dL — ABNORMAL LOW (ref 13.0–17.0)
Hemoglobin: 8.5 g/dL — ABNORMAL LOW (ref 13.0–17.0)
Hemoglobin: 9.8 g/dL — ABNORMAL LOW (ref 13.0–17.0)
MCH: 33 pg (ref 26.0–34.0)
MCH: 33.2 pg (ref 26.0–34.0)
MCH: 32.6 pg (ref 26.0–34.0)
MCH: 32.7 pg (ref 26.0–34.0)
MCHC: 34.3 g/dL (ref 30.0–36.0)
MCHC: 34.2 g/dL (ref 30.0–36.0)
MCHC: 33.3 g/dL (ref 30.0–36.0)
MCHC: 33.1 g/dL (ref 30.0–36.0)
MCV: 96.4 fL (ref 78.0–100.0)
MCV: 97.1 fL (ref 78.0–100.0)
MCV: 97.7 fL (ref 78.0–100.0)
MCV: 98.7 fL (ref 78.0–100.0)
PLATELETS: 188 10*3/uL (ref 150–400)
Platelets: 155 10*3/uL (ref 150–400)
Platelets: 169 10*3/uL (ref 150–400)
Platelets: 192 10*3/uL (ref 150–400)
RBC: 3.33 MIL/uL — ABNORMAL LOW (ref 4.22–5.81)
RBC: 2.44 MIL/uL — AB (ref 4.22–5.81)
RBC: 2.61 MIL/uL — AB (ref 4.22–5.81)
RBC: 3 MIL/uL — AB (ref 4.22–5.81)
RDW: 12.8 % (ref 11.5–15.5)
RDW: 12.9 % (ref 11.5–15.5)
RDW: 13.2 % (ref 11.5–15.5)
RDW: 13.2 % (ref 11.5–15.5)
WBC: 6.6 10*3/uL (ref 4.0–10.5)
WBC: 4.6 10*3/uL (ref 4.0–10.5)
WBC: 4.2 10*3/uL (ref 4.0–10.5)
WBC: 5.4 10*3/uL (ref 4.0–10.5)

## 2018-06-21 LAB — I-STAT CHEM 8, ED
BUN: 38 mg/dL — ABNORMAL HIGH (ref 8–23)
CALCIUM ION: 1.15 mmol/L (ref 1.15–1.40)
CHLORIDE: 93 mmol/L — AB (ref 98–111)
Creatinine, Ser: 1 mg/dL (ref 0.61–1.24)
Glucose, Bld: 94 mg/dL (ref 70–99)
HCT: 34 % — ABNORMAL LOW (ref 39.0–52.0)
Hemoglobin: 11.6 g/dL — ABNORMAL LOW (ref 13.0–17.0)
Potassium: 3.5 mmol/L (ref 3.5–5.1)
Sodium: 132 mmol/L — ABNORMAL LOW (ref 135–145)
TCO2: 28 mmol/L (ref 22–32)

## 2018-06-21 LAB — BASIC METABOLIC PANEL
ANION GAP: 9 (ref 5–15)
BUN: 15 mg/dL (ref 8–23)
CO2: 27 mmol/L (ref 22–32)
Calcium: 9.2 mg/dL (ref 8.9–10.3)
Chloride: 104 mmol/L (ref 98–111)
Creatinine, Ser: 1.01 mg/dL (ref 0.61–1.24)
GFR calc Af Amer: >60 mL/min (ref >60)
GFR calc non Af Amer: >60 mL/min (ref >60)
GLUCOSE: 130 mg/dL — AB (ref 70–99)
POTASSIUM: 4.1 mmol/L (ref 3.5–5.1)
Sodium: 140 mmol/L (ref 135–145)

## 2018-06-21 LAB — COMPREHENSIVE METABOLIC PANEL
ALBUMIN: 3.7 g/dL (ref 3.5–5.0)
ALK PHOS: 62 U/L (ref 38–126)
ALT: 26 U/L (ref 0–44)
AST: 31 U/L (ref 15–41)
Anion gap: 15 (ref 5–15)
BILIRUBIN TOTAL: 0.7 mg/dL (ref 0.3–1.2)
BUN: 31 mg/dL — AB (ref 8–23)
CALCIUM: 9.5 mg/dL (ref 8.9–10.3)
CO2: 25 mmol/L (ref 22–32)
CREATININE: 0.96 mg/dL (ref 0.61–1.24)
Chloride: 94 mmol/L — ABNORMAL LOW (ref 98–111)
GFR calc Af Amer: >60 mL/min (ref >60)
Glucose, Bld: 100 mg/dL — ABNORMAL HIGH (ref 70–99)
POTASSIUM: 3.5 mmol/L (ref 3.5–5.1)
Sodium: 134 mmol/L — ABNORMAL LOW (ref 135–145)
TOTAL PROTEIN: 6.7 g/dL (ref 6.5–8.1)

## 2018-06-21 LAB — MRSA PCR SCREENING: MRSA by PCR: NEGATIVE

## 2018-06-21 LAB — PROTIME-INR
INR: 1.03
Prothrombin Time: 13.4 seconds (ref 11.4–15.2)

## 2018-06-21 LAB — TROPONIN I: Troponin I: <0.03 ng/mL (ref <0.03)

## 2018-06-21 MED ORDER — METOCLOPRAMIDE HCL 5 MG/ML IJ SOLN
10.0000 mg | Freq: Once | INTRAMUSCULAR | Status: AC
  Administered 2018-06-21: 10 mg via INTRAVENOUS
  Filled 2018-06-21: qty 2

## 2018-06-21 MED ORDER — VITAMIN B-1 100 MG PO TABS
100.0000 mg | ORAL_TABLET | Freq: Every day | ORAL | Status: DC
  Administered 2018-06-21 – 2018-06-23 (×3): 100 mg via ORAL
  Filled 2018-06-21 (×3): qty 1

## 2018-06-21 MED ORDER — CANDESARTAN CILEXETIL-HCTZ 16-12.5 MG PO TABS: 1.0000 | ORAL_TABLET | Freq: Every day | ORAL | Status: DC

## 2018-06-21 MED ORDER — LORAZEPAM 0.5 MG PO TABS
0.5000 mg | ORAL_TABLET | Freq: Once | ORAL | Status: AC
  Administered 2018-06-21: 0.5 mg via ORAL
  Filled 2018-06-21: qty 1

## 2018-06-21 MED ORDER — FAMOTIDINE IN NACL 20-0.9 MG/50ML-% IV SOLN
20.0000 mg | Freq: Two times a day (BID) | INTRAVENOUS | Status: DC
  Administered 2018-06-21: 20 mg via INTRAVENOUS
  Filled 2018-06-21: qty 50

## 2018-06-21 MED ORDER — ONDANSETRON HCL 4 MG/2ML IJ SOLN
4.0000 mg | Freq: Four times a day (QID) | INTRAMUSCULAR | Status: DC | PRN
  Administered 2018-06-22: 4 mg via INTRAVENOUS

## 2018-06-21 MED ORDER — LORATADINE 10 MG PO TABS
10.0000 mg | ORAL_TABLET | Freq: Every day | ORAL | Status: DC
  Administered 2018-06-21 – 2018-06-23 (×3): 10 mg via ORAL
  Filled 2018-06-21 (×3): qty 1

## 2018-06-21 MED ORDER — ALBUTEROL SULFATE (2.5 MG/3ML) 0.083% IN NEBU
3.0000 mL | INHALATION_SOLUTION | RESPIRATORY_TRACT | Status: DC | PRN
  Administered 2018-06-21 – 2018-06-23 (×4): 3 mL via RESPIRATORY_TRACT
  Filled 2018-06-21 (×4): qty 3

## 2018-06-21 MED ORDER — FAMOTIDINE IN NACL 20-0.9 MG/50ML-% IV SOLN: 20.0000 mg | Freq: Two times a day (BID) | INTRAVENOUS | Status: DC

## 2018-06-21 MED ORDER — ONDANSETRON HCL 4 MG PO TABS: 4.0000 mg | ORAL_TABLET | Freq: Four times a day (QID) | ORAL | Status: DC | PRN

## 2018-06-21 MED ORDER — ADULT MULTIVITAMIN W/MINERALS CH
1.0000 | ORAL_TABLET | Freq: Every day | ORAL | Status: DC
  Administered 2018-06-21 – 2018-06-23 (×3): 1 via ORAL
  Filled 2018-06-21 (×3): qty 1

## 2018-06-21 MED ORDER — SODIUM CHLORIDE 0.9 % IV SOLN
INTRAVENOUS | Status: DC
  Administered 2018-06-21 (×2): via INTRAVENOUS
  Administered 2018-06-22: 1000 mL via INTRAVENOUS
  Administered 2018-06-23 (×2): via INTRAVENOUS

## 2018-06-21 MED ORDER — ONDANSETRON HCL 4 MG/2ML IJ SOLN
INTRAMUSCULAR | Status: AC
  Administered 2018-06-21: 03:00:00
  Filled 2018-06-21: qty 2

## 2018-06-21 MED ORDER — FOLIC ACID 1 MG PO TABS
1.0000 mg | ORAL_TABLET | Freq: Every day | ORAL | Status: DC
  Administered 2018-06-21 – 2018-06-23 (×3): 1 mg via ORAL
  Filled 2018-06-21 (×3): qty 1

## 2018-06-21 MED ORDER — PANTOPRAZOLE SODIUM 40 MG IV SOLR
40.0000 mg | Freq: Two times a day (BID) | INTRAVENOUS | Status: DC
  Administered 2018-06-21 – 2018-06-23 (×5): 40 mg via INTRAVENOUS
  Filled 2018-06-21 (×5): qty 40

## 2018-06-21 MED ORDER — THIAMINE HCL 100 MG/ML IJ SOLN: 100.0000 mg | Freq: Every day | INTRAMUSCULAR | Status: DC

## 2018-06-21 MED ORDER — SODIUM CHLORIDE 0.9 % IV SOLN
50.0000 ug/h | INTRAVENOUS | Status: DC
  Administered 2018-06-21 (×2): 50 ug/h via INTRAVENOUS
  Filled 2018-06-21 (×3): qty 1

## 2018-06-21 MED ORDER — OCTREOTIDE LOAD VIA INFUSION
50.0000 ug | Freq: Once | INTRAVENOUS | Status: AC
  Administered 2018-06-21: 50 ug via INTRAVENOUS
  Filled 2018-06-21: qty 25

## 2018-06-21 MED ORDER — SODIUM CHLORIDE 0.9 % IV BOLUS
2000.0000 mL | Freq: Once | INTRAVENOUS | Status: AC
  Administered 2018-06-21: 2000 mL via INTRAVENOUS

## 2018-06-21 MED ORDER — MONTELUKAST SODIUM 10 MG PO TABS
10.0000 mg | ORAL_TABLET | Freq: Every day | ORAL | Status: DC
  Administered 2018-06-21 – 2018-06-22 (×2): 10 mg via ORAL
  Filled 2018-06-21 (×2): qty 1

## 2018-06-21 MED ORDER — UMECLIDINIUM BROMIDE 62.5 MCG/INH IN AEPB
1.0000 | INHALATION_SPRAY | Freq: Every day | RESPIRATORY_TRACT | Status: DC
  Administered 2018-06-21 – 2018-06-23 (×2): 1 via RESPIRATORY_TRACT
  Filled 2018-06-21: qty 7

## 2018-06-21 MED ORDER — ALBUTEROL SULFATE HFA 108 (90 BASE) MCG/ACT IN AERS: 2.0000 | INHALATION_SPRAY | RESPIRATORY_TRACT | Status: DC | PRN

## 2018-06-21 MED ORDER — CIPROFLOXACIN IN D5W 400 MG/200ML IV SOLN
400.0000 mg | Freq: Once | INTRAVENOUS | Status: AC
  Administered 2018-06-21: 400 mg via INTRAVENOUS
  Filled 2018-06-21: qty 200

## 2018-06-21 MED ORDER — ROSUVASTATIN CALCIUM 20 MG PO TABS
20.0000 mg | ORAL_TABLET | Freq: Every day | ORAL | Status: DC
  Administered 2018-06-21 – 2018-06-23 (×3): 20 mg via ORAL
  Filled 2018-06-21 (×3): qty 1

## 2018-06-21 NOTE — ED Provider Notes (Signed)
[facility] EMERGENCY DEPARTMENT Provider Note  CSN: [id number] Arrival date & time: 06/21/18 5456  Chief Complaint(s) GI Bleeding  HPI Lucas [name] is a 67 y.o. male with a history listed below including daily alcohol use presents to the emergency department with 4 to 5 hours of intermittent hematemesis and melena.  He endorses mild epigastric discomfort due to emesis.  No other alleviating or aggravating factors.  Denies any recent fevers or infections.  Denies any chest pain or shortness of breath.  Denies any other physical complaints at this time.  Last drink was this evening.  States that he typically drinks 2 to 4 - 16 ounce cans of beer daily.  HPI  Past Medical History Past Medical History:  Diagnosis Date  . Allergy   . COPD 12/21/2006  . Environmental allergies   . HYPERLIPIDEMIA 12/21/2006  . HYPERTENSION, BENIGN SYSTEMIC 12/21/2006  . TOBACCO DEPENDENCE 12/21/2006   Patient Active Problem List   Diagnosis Date Noted  . Tubular adenoma of colon 01/24/2017  . Healthcare maintenance 10/13/2016  . HTN (hypertension) 02/13/2013  . Ganglion cyst 01/03/2012  . PERIPHERAL NEUROPATHY 07/15/2010  . Alcohol abuse 05/14/2009  . DEFORMITY, FOOT NEC, CONGENITAL 08/22/2007  . ALLERGIC RHINITIS, SEASONAL 05/03/2007  . Hyperlipidemia 12/21/2006  . TOBACCO DEPENDENCE 12/21/2006  . COPD, mild ([place]) 12/21/2006   Home Medication(s) Prior to Admission medications   Medication Sig Start Date End Date Taking? Authorizing Provider  albuterol (PROVENTIL HFA;VENTOLIN HFA) 108 (90 Base) MCG/ACT inhaler INHALE 2 PUFFS INTO THE LUNGS EVERY 4 HOURS AS NEEDED FOR WHEEZING OR SHORTNESS OF BREATH 02/07/18   Steve Rattler, DO  aspirin EC 81 MG tablet Take 1 tablet (81 mg total) by mouth daily. 10/13/16   Katheren Shams, DO  candesartan-hydrochlorothiazide (ATACAND HCT) 16-12.5 MG tablet Take 1 tablet by mouth daily. 02/07/18   Steve Rattler, DO  cetirizine (ZYRTEC) 10 MG  tablet Take 1 tablet (10 mg total) by mouth daily. 02/07/18   Steve Rattler, DO  ibuprofen (ADVIL,MOTRIN) 200 MG tablet Take 400 mg by mouth every 6 (six) hours as needed for mild pain.    [provider]  montelukast (SINGULAIR) 10 MG tablet Take 1 tablet (10 mg total) by mouth at bedtime. 05/23/18   Steve Rattler, DO  naproxen (NAPROSYN) 375 MG tablet Take 1 tablet (375 mg total) by mouth 2 (two) times daily. 12/08/17   Charlesetta Shanks, MD  rosuvastatin (CRESTOR) 20 MG tablet Take 1 tablet (20 mg total) by mouth daily. 10/27/17   Steve Rattler, DO  umeclidinium bromide (INCRUSE ELLIPTA) 62.5 MCG/INH AEPB Inhale 1 puff into the lungs daily. 05/23/18   Steve Rattler, DO  loratadine (CLARITIN) 10 MG tablet Take 1 tablet (10 mg total) by mouth daily. 03/04/11 05/23/18  Mariana Arn, MD  Past Surgical History Past Surgical History:  Procedure Laterality Date  . ANKLE SURGERY     left ankle  . APPLICATION OF WOUND VAC  01/06/2012   Procedure: APPLICATION OF WOUND VAC;  Surgeon: Meredith Pel, MD;  Location: [facility];  Service: Orthopedics;  Laterality: Left;  . APPLICATION OF WOUND VAC Right 10/17/2015   Procedure: APPLICATION OF WOUND VAC;  Surgeon: Leandrew Koyanagi, MD;  Location: [place];  Service: Orthopedics;  Laterality: Right;  . EXTERNAL FIXATION LEG  01/06/2012   Procedure: EXTERNAL FIXATION LEG;  Surgeon: Meredith Pel, MD;  Location: [facility];  Service: Orthopedics;  Laterality: Left;  Smith-nephew external fixator set  . EXTERNAL FIXATION LEG Right 09/16/2015   Procedure: EXTERNAL FIXATION LEG/LARGE;  Surgeon: Mcarthur Rossetti, MD;  Location: [place];  Service: Orthopedics;  Laterality: Right;  . EXTERNAL FIXATION REMOVAL Right 09/22/2015   Procedure: REMOVAL EXTERNAL FIXATION LEG;  Surgeon: Mcarthur Rossetti, MD;  Location: [place];   Service: Orthopedics;  Laterality: Right;  . FASCIOTOMY  01/10/2012   Procedure: FASCIOTOMY;  Surgeon: Rozanna Box, MD;  Location: [place];  Service: Orthopedics;  Laterality: Left;  status post fasciotomy with wound vac left calf; adjustment external fixator left tibia under fluoro; retention suture/wound vac placement left lateral calf wound  . FASCIOTOMY  01/06/2012   Procedure: FASCIOTOMY;  Surgeon: Meredith Pel, MD;  Location: [facility];  Service: Orthopedics;  Laterality: Left;  Four compartment fasciotomy  . I&D EXTREMITY  01/12/2012   Procedure: IRRIGATION AND DEBRIDEMENT EXTREMITY;  Surgeon: Rozanna Box, MD;  Location: [place];  Service: Orthopedics;  Laterality: Left;  LAYERD CLOSURE LEFT LEG WOUND  . I&D EXTREMITY Right 10/17/2015   Procedure: IRRIGATION AND DEBRIDEMENT EXTREMITY;  Surgeon: Leandrew Koyanagi, MD;  Location: [place];  Service: Orthopedics;  Laterality: Right;  . I&D EXTREMITY Right 10/20/2015   Procedure: REPEAT IRRIGATION AND DEBRIDEMENT EXTREMITY, right lower leg;  Surgeon: Mcarthur Rossetti, MD;  Location: [place];  Service: Orthopedics;  Laterality: Right;  . KNEE SURGERY     left knee and right knee  . MASS EXCISION Left 09/22/2015   Procedure: EXCISION MASS;  Surgeon: Mcarthur Rossetti, MD;  Location: [place];  Service: Orthopedics;  Laterality: Left;  . ORIF TIBIA PLATEAU  02/07/2012   Procedure: OPEN REDUCTION INTERNAL FIXATION (ORIF) TIBIAL PLATEAU;  Surgeon: Rozanna Box, MD;  Location: [place];  Service: Orthopedics;  Laterality: Left;  ORIF LEFT TIBIAL PLATEAU/ REMOVE EXTERNAL FIXATION LEFT LEG  . ORIF TIBIA PLATEAU Right 09/22/2015   Procedure: REMOVAL OF EXTERNAL FIXATOR RIGHT LEG, OPEN REDUCTION INTERNAL FIXATION (ORIF) RIGHT TIBIAL PLATEAU, EXCISION SMALL MASS LEFT LEG;  Surgeon: Mcarthur Rossetti, MD;  Location: [place];  Service: Orthopedics;  Laterality: Right;  . SKIN SPLIT GRAFT Right 10/23/2015   Procedure: RIGHT GASTROC FLAP SPLIT THICKNESS  SKIN GRAFT FROM RIGHT THING TO RIGHT LEG;  Surgeon: Irene Limbo, MD;  Location: [place];  Service: Plastics;  Laterality: Right;  . WRIST SURGERY     left   Family History Family History  Problem Relation Age of Onset  . Emphysema Father     Social History Social History   Tobacco Use  . Smoking status: Current Every Day Smoker    Packs/day: 0.50    Years: 51.00    Pack years: 25.50    Types: Cigarettes  . Smokeless tobacco: Former Systems developer    Types: Snuff, Sarina Ser    Quit  date: 01/05/1965  Substance Use Topics  . Alcohol use: Yes    Alcohol/week: 3.0 - 4.0 standard drinks    Types: 3 - 4 Cans of beer per week    Comment: 3-4 cans beer daily  . Drug use: No   Allergies Ace inhibitors; Ampicillin; Penicillins; Codeine; and Lisinopril  Review of Systems Review of Systems All other systems are reviewed and are negative for acute change except as noted in the HPI  Physical Exam Vital Signs  I have reviewed the triage vital signs BP (!) 146/67   Pulse 87   Temp 98.2 F (36.8 C) (Oral)   Resp 19   Ht 5\' 11"  (1.803 m)   Wt 66.2 kg   SpO2 99%   BMI 20.36 kg/m   Physical Exam  Constitutional: He is oriented to person, place, and time. He appears well-developed and well-nourished. He appears ill. No distress.  Active hematemesis with maroon-colored emesis.  HENT:  Head: Normocephalic and atraumatic.  Nose: Nose normal.  Eyes: Pupils are equal, round, and reactive to light. Conjunctivae and EOM are normal. Right eye exhibits no discharge. Left eye exhibits no discharge. No scleral icterus.  Neck: Normal range of motion. Neck supple.  Cardiovascular: Regular rhythm. Tachycardia present. Exam reveals no gallop and no friction rub.  No murmur heard. Pulmonary/Chest: Effort normal and breath sounds normal. No stridor. No respiratory distress. He has no rales.  Abdominal: Soft. He exhibits no distension. There is no tenderness.  Musculoskeletal: He exhibits no edema or  tenderness.  Neurological: He is alert and oriented to person, place, and time.  Skin: Skin is warm. No rash noted. He is diaphoretic. No erythema.  Psychiatric: He has a normal mood and affect.  Vitals reviewed.   ED Results and Treatments Labs (all labs ordered are listed, but only abnormal results are displayed) Labs Reviewed  COMPREHENSIVE METABOLIC PANEL - Abnormal; Notable for the following components:      Result Value   Sodium 134 (*)    Chloride 94 (*)    Glucose, Bld 100 (*)    BUN 31 (*)    All other components within normal limits  CBC - Abnormal; Notable for the following components:   RBC 3.33 (*)    Hemoglobin 11.0 (*)    HCT 32.1 (*)    All other components within normal limits  I-STAT CHEM 8, ED - Abnormal; Notable for the following components:   Sodium 132 (*)    Chloride 93 (*)    BUN 38 (*)    Hemoglobin 11.6 (*)    HCT 34.0 (*)    All other components within normal limits  PROTIME-INR  POC OCCULT BLOOD, ED  TYPE AND SCREEN                                                                                                                         EKG  EKG Interpretation  Date/Time:  Thursday June 21 2018 02:49:05 EDT  Ventricular Rate:  90 PR Interval:    QRS Duration: 100 QT Interval:  367 QTC Calculation: 449 R Axis:   80 Text Interpretation:  Sinus rhythm Consider left atrial enlargement LVH with secondary repolarization abnormality No significant change since last tracing Confirmed by Addison Lank [phone]) on 06/21/2018 3:39:28 AM      Radiology No results found. Pertinent labs & imaging results that were available during my care of the patient were reviewed by me and considered in my medical decision making (see chart for details).  Medications Ordered in ED Medications  sodium chloride 0.9 % bolus 2,000 mL (2,000 mLs Intravenous New Bag/Given 06/21/18 0255)  famotidine (PEPCID) IVPB 20 mg premix (20 mg Intravenous New Bag/Given 06/21/18 0307)   octreotide (SANDOSTATIN) 2 mcg/mL load via infusion 50 mcg (has no administration in time range)    And  octreotide (SANDOSTATIN) 500 mcg in sodium chloride 0.9 % 250 mL (2 mcg/mL) infusion (has no administration in time range)  ciprofloxacin (CIPRO) IVPB 400 mg (400 mg Intravenous New Bag/Given 06/21/18 0308)  ondansetron (ZOFRAN) 4 MG/2ML injection (  Given 06/21/18 0248)  metoCLOPramide (REGLAN) injection 10 mg (10 mg Intravenous Given 06/21/18 0302)                                                                                                                                    Procedures Procedures CRITICAL CARE Performed by: Grayce Sessions Murrell Elizondo Total critical care time: 45 minutes Critical care time was exclusive of separately billable procedures and treating other patients. Critical care was necessary to treat or prevent imminent or life-threatening deterioration. Critical care was time spent personally by me on the following activities: development of treatment plan with patient and/or surrogate as well as nursing, discussions with consultants, evaluation of patient's response to treatment, examination of patient, obtaining history from patient or surrogate, ordering and performing treatments and interventions, ordering and review of laboratory studies, ordering and review of radiographic studies, pulse oximetry and re-evaluation of patient's condition.   (including critical care time)  Medical Decision Making / ED Course I have reviewed the nursing notes for this encounter and the patient's prior records (if available in EHR or on provided paperwork).    Active hematemesis.  On review of record patient uses NSAIDs.  However given his daily history of alcohol consumption, more suspicious for variceal bleed.  Noted to have soft blood pressures in the low 100s.  2 large-bore's IV place.  Patient given 2 L of IV fluids.  Also given antiemetics.  Patient started on Pepcid, octreotide and  Ciprofloxacillin.  Blood pressures improved with IV fluid.  Hemoglobin with a 2.5 grams drop in 6 months.  Rest of the labs reassuring.  NG tube placement was attempted but unsuccessful and patient declined additional attempts.  Patient has not had any active hematemesis in the past hour.  Will discuss case with family medicine for admission and continued  management.  Patient will likely require EGD within the next 12 to 24 hours.    Final Clinical Impression(s) / ED Diagnoses Final diagnoses:  Acute GI bleeding      This chart was dictated using voice recognition software.  Despite best efforts to proofread,  errors can occur which can change the documentation meaning.   Fatima Blank, MD 06/21/18 [phone]

## 2018-06-21 NOTE — ED Notes (Signed)
Md at bedside giving update,

## 2018-06-21 NOTE — Significant Event (Signed)
Rapid Response Event Note  Overview: Called by nursing staff for pt with acute respiratory distress following coughing episode  Initial Focused Assessment: Upon arrival, Mr. Livsey was alert, sitting up on the side of the bed.  Tachypneic (30-35) and tachycardic (ST 130s) and anxious. Oxygen saturations 98%. He states he is having trouble catching his breath and has chest tightness spanning across the front of his chest.  BBS predominantly clear with good air movement with minimal expiratory wheeze and fine bibasilar crackles.  He has a productive cough with tan sputum. No adventitious HS. His skin is warm, pink and very diaphoretic. Abigail Butts RRT at bedside and currently giving an albuterol neb tx.  FPTS Dr. Burr Medico at bedside shortly after my arrival.  Mr. Hammen stated he suddenly got short of breath after coughing episode.  Mr. Golson breathing is improved following the neb tx.  Interventions: -Nebulizer  Plan of Care (if not transferred): -orders per FPTS -continue to monitor -Notify FPTS and/or RRRN of any further clinical decompensations.   Madelynn Done

## 2018-06-21 NOTE — Progress Notes (Signed)
FPTS Interim Progress Note  S: Paged by nursing. Patient with sudden onset chest pain, diaphoresis, dyspnea. Rapid response was called. Patient examined at bedside. He developed chest pain and dyspnea in the setting of a coughing fit, and then he tried to get out of the bed abruptly exacerbating the discomfort. On exam, he is uncomfortable-appearing but vitals are stable and he does not appear acutely toxic. He is receiving a breathing treatment. He seems anxious. No longer endorsing chest pain by the time we arrive to his bedside.  O: BP 136/73   Pulse 92   Temp 97.8 F (36.6 C)   Resp 20   Ht 5\' 11"  (1.803 m)   Wt 68.7 kg   SpO2 100%   BMI 21.12 kg/m   GEN: older gentleman rests in bed, anxious appearing, nontoxic CARD: RRR, no m/r/g PULM: CTA bil, distant breath sounds, no W/R/R, receiving a breathing treatment ABD: nondistended, normoactive BS, minimal periumbilical tenderness NEURO: CNII-XII intact PSYCH: AAO  A/P: Acute chest pain and Dyspnea: Pulm vs cardiac vs anxiety etiology, also must consider GI etiology given patient's admission for likely upper GI bleed with hematemesis yesterday. No episodes of emesis currently. Also monitoring on CIWA for withdrawal, however given acute onset of this discomfort after a coughing fit, seems less consistent with withdrawal. - will order albuterol inhaler (patient prefers), duonebs PRN - troponin, EKG stat - check stat CBC for Hgb, BMP for electrolytes - monitor O2 saturations - 0.5 mg Ativan x1 for anxiety - will continue to monitor closely - appreciate excellent nursing care  Everrett Coombe, MD 06/21/2018, 9:28 PM PGY-3, [facility] pager [phone]

## 2018-06-21 NOTE — Consult Note (Addendum)
[facility] Consult: 1:52 PM 06/21/2018  LOS: 0 days    Referring Provider: Dr Ouida Sills of  [place]  Primary Care Physician:  Steve Rattler, DO Primary Gastroenterologist:  Dr. Fuller Plan     Reason for Consultation:  GIB.     HPI: Lucas [name] is a 67 y.o. male.  Hx COPD, ongoing tobacco abuse.  Hypertension.  Hyperlipidemia.  Bilateral lower extremity fractures requiring surgery. Thrombocytopenia 12/2016 colonoscopy.  Dr. Fuller Plan.   Initial screening colonoscopy.  Several polyps removed, sized to 28m to 126m  Internal hemorrhoids.  Pathology: Adenomatous and tubular adenomas.  No high-grade dysplasia or malignancy.  11/2017 CT scan of the abdomen/pelvis after multitrauma.  Fractures of the ribs, clavicle.  Small pneumatocele versus pneumothorax.  Extensive aortic atherosclerosis.  Nonunited, remote, greater "cantor femur fracture on the right.  Remote AVN of femoral head.  No liver abnormalities.  On a daily basis patient drinks 80 ounces of beer.  Tuesday, 06/19/2019 was his birthday and in addition to beer he drank quite a bit of vodka and tequila.  The next morning, he started vomiting, emesis was bloody from the start.  He had several episodes during the day and finally, after becoming weak and having his legs give way, he came to the emergency room around midnight.  His abdomen is "tender" but he is not having significant abdominal pain.  Stool was loose and black yesterday evening.  Loose and (? Color) this AM.   He has had some increase in reflux type symptoms with abdominal bloating and heartburn over the last month or so, self-treating  With Bismuth. He takes occasional ibuprofen.  Last use was 600 mg last week.  Not taking PPI or H2 blocker at home.  Hgb 11 >> 11.6 >> 8.1. Was ~ 14 from 03/2017 - 11/2017.     Platelets 155 (130 in 11/2017) PT/INR 13.4/1.0. Nightly elevated 38.  Normal creatinine.  Sodium 132.  LFTs normal  Initiated meds include octreotide GTT, Protonix 40 IV twice daily, Ciprofloxacin   Past Medical History:  Diagnosis Date  . Allergy   . COPD 12/21/2006  . Environmental allergies   . HYPERLIPIDEMIA 12/21/2006  . HYPERTENSION, BENIGN SYSTEMIC 12/21/2006  . TOBACCO DEPENDENCE 12/21/2006    Past Surgical History:  Procedure Laterality Date  . ANKLE SURGERY     left ankle  . APPLICATION OF WOUND VAC  01/06/2012   Procedure: APPLICATION OF WOUND VAC;  Surgeon: GrMeredith PelMD;  Location: [facility];  Service: Orthopedics;  Laterality: Left;  . APPLICATION OF WOUND VAC Right 10/17/2015   Procedure: APPLICATION OF WOUND VAC;  Surgeon: NaLeandrew KoyanagiMD;  [facility] Service: [facility]ics;  Laterality: Right;  . EXTERNAL FIXATION LEG  01/06/2012   Procedure: EXTERNAL FIXATION LEG;  Surgeon: GrMeredith PelMD;  Location: [facility];  Service: Orthopedics;  Laterality: Left;  Smith-nephew external fixator set  . EXTERNAL FIXATION LEG Right 09/16/2015   Procedure: EXTERNAL FIXATION LEG/LARGE;  Surgeon: ChMcarthur RossettiMD;  [place] [facility] Service: Orthopedics;  Laterality: Right;  . EXTERNAL FIXATION REMOVAL Right 09/22/2015   Procedure: REMOVAL EXTERNAL FIXATION LEG;  Surgeon: Mcarthur Rossetti, MD;  Location: [place];  Service: Orthopedics;  Laterality: Right;  . FASCIOTOMY  01/10/2012   Procedure: FASCIOTOMY;  Surgeon: Rozanna Box, MD;  Location: [place];  Service: Orthopedics;  Laterality: Left;  status post fasciotomy with wound vac left calf; adjustment external fixator left tibia under fluoro; retention suture/wound vac placement left lateral calf wound  . FASCIOTOMY  01/06/2012   Procedure: FASCIOTOMY;  Surgeon: Meredith Pel, MD;  Location: [facility];  Service: Orthopedics;  Laterality: Left;  Four compartment fasciotomy  . I&D EXTREMITY  01/12/2012    Procedure: IRRIGATION AND DEBRIDEMENT EXTREMITY;  Surgeon: Rozanna Box, MD;  Location: [place];  Service: Orthopedics;  Laterality: Left;  LAYERD CLOSURE LEFT LEG WOUND  . I&D EXTREMITY Right 10/17/2015   Procedure: IRRIGATION AND DEBRIDEMENT EXTREMITY;  Surgeon: Leandrew Koyanagi, MD;  Location: [place];  Service: Orthopedics;  Laterality: Right;  . I&D EXTREMITY Right 10/20/2015   Procedure: REPEAT IRRIGATION AND DEBRIDEMENT EXTREMITY, right lower leg;  Surgeon: Mcarthur Rossetti, MD;  Location: [place];  Service: Orthopedics;  Laterality: Right;  . KNEE SURGERY     left knee and right knee  . MASS EXCISION Left 09/22/2015   Procedure: EXCISION MASS;  Surgeon: Mcarthur Rossetti, MD;  Location: [place];  Service: Orthopedics;  Laterality: Left;  . ORIF TIBIA PLATEAU  02/07/2012   Procedure: OPEN REDUCTION INTERNAL FIXATION (ORIF) TIBIAL PLATEAU;  Surgeon: Rozanna Box, MD;  Location: [place];  Service: Orthopedics;  Laterality: Left;  ORIF LEFT TIBIAL PLATEAU/ REMOVE EXTERNAL FIXATION LEFT LEG  . ORIF TIBIA PLATEAU Right 09/22/2015   Procedure: REMOVAL OF EXTERNAL FIXATOR RIGHT LEG, OPEN REDUCTION INTERNAL FIXATION (ORIF) RIGHT TIBIAL PLATEAU, EXCISION SMALL MASS LEFT LEG;  Surgeon: Mcarthur Rossetti, MD;  Location: [place];  Service: Orthopedics;  Laterality: Right;  . SKIN SPLIT GRAFT Right 10/23/2015   Procedure: RIGHT GASTROC FLAP SPLIT THICKNESS SKIN GRAFT FROM RIGHT THING TO RIGHT LEG;  Surgeon: Irene Limbo, MD;  Location: [place];  Service: Plastics;  Laterality: Right;  . WRIST SURGERY     left    Prior to Admission medications   Medication Sig Start Date End Date Taking? Authorizing Provider  albuterol (PROVENTIL HFA;VENTOLIN HFA) 108 (90 Base) MCG/ACT inhaler INHALE 2 PUFFS INTO THE LUNGS EVERY 4 HOURS AS NEEDED FOR WHEEZING OR SHORTNESS OF BREATH 02/07/18  Yes Riccio, Angela C, DO  aspirin EC 81 MG tablet Take 1 tablet (81 mg total) by mouth daily. 10/13/16  Yes Luiz Blare  Y, DO  candesartan-hydrochlorothiazide (ATACAND HCT) 16-12.5 MG tablet Take 1 tablet by mouth daily. 02/07/18  Yes Riccio, Gardiner Rhyme, DO  cetirizine (ZYRTEC) 10 MG tablet Take 1 tablet (10 mg total) by mouth daily. 02/07/18  Yes Riccio, Angela C, DO  ibuprofen (ADVIL,MOTRIN) 200 MG tablet Take 400 mg by mouth every 6 (six) hours as needed for mild pain.   Yes [provider]  montelukast (SINGULAIR) 10 MG tablet Take 1 tablet (10 mg total) by mouth at bedtime. 05/23/18  Yes Riccio, Angela C, DO  naproxen (NAPROSYN) 375 MG tablet Take 1 tablet (375 mg total) by mouth 2 (two) times daily. Patient taking differently: Take 375 mg by mouth 2 (two) times daily as needed for mild pain.  12/08/17  Yes Charlesetta Shanks, MD  rosuvastatin (CRESTOR) 20 MG tablet Take 1 tablet (  20 mg total) by mouth daily. 10/27/17  Yes Riccio, Angela C, DO  umeclidinium bromide (INCRUSE ELLIPTA) 62.5 MCG/INH AEPB Inhale 1 puff into the lungs daily. 05/23/18  Yes Riccio, Angela C, DO  loratadine (CLARITIN) 10 MG tablet Take 1 tablet (10 mg total) by mouth daily. 03/04/11 05/23/18  Mariana Arn, MD    Scheduled Meds: . folic acid  1 mg Oral Daily  . loratadine  10 mg Oral Daily  . montelukast  10 mg Oral QHS  . multivitamin with minerals  1 tablet Oral Daily  . pantoprazole (PROTONIX) IV  40 mg Intravenous Q12H  . rosuvastatin  20 mg Oral Daily  . thiamine  100 mg Oral Daily   Or  . thiamine  100 mg Intravenous Daily  . umeclidinium bromide  1 puff Inhalation Daily   Infusions: . sodium chloride 100 mL/hr at 06/21/18 0643  . octreotide  (SANDOSTATIN)    IV infusion 50 mcg/hr (06/21/18 1310)   PRN Meds: albuterol, ondansetron **OR** ondansetron (ZOFRAN) IV   Allergies as of 06/21/2018 - Review Complete 06/21/2018  Allergen Reaction Noted  . Ace inhibitors Swelling 01/15/2012  . Ampicillin Nausea And Vomiting 08/09/2009  . Penicillins Nausea And Vomiting 08/09/2009  . Codeine Nausea And Vomiting 08/09/2009  .  Lisinopril Swelling 08/21/2007    Family History  Problem Relation Age of Onset  . Emphysema Father     Social History   Socioeconomic History  . Marital status: Married    Spouse name: Not on file  . Number of children: Not on file  . Years of education: Not on file  . Highest education level: Not on file  Occupational History  . Not on file  Social Needs  . Financial resource strain: Not on file  . Food insecurity:    Worry: Not on file    Inability: Not on file  . Transportation needs:    Medical: Not on file    Non-medical: Not on file  Tobacco Use  . Smoking status: Current Every Day Smoker    Packs/day: 0.50    Years: 51.00    Pack years: 25.50    Types: Cigarettes  . Smokeless tobacco: Former Systems developer    Types: Snuff, Sarina Ser    Quit date: 01/05/1965  Substance and Sexual Activity  . Alcohol use: Yes    Alcohol/week: 3.0 - 4.0 standard drinks    Types: 3 - 4 Cans of beer per week    Comment: 3-4 cans beer daily  . Drug use: No  . Sexual activity: Yes    Partners: Female  Lifestyle  . Physical activity:    Days per week: Not on file    Minutes per session: Not on file  . Stress: Not on file  Relationships  . Social connections:    Talks on phone: Not on file    Gets together: Not on file    Attends religious service: Not on file    Active member of club or organization: Not on file    Attends meetings of clubs or organizations: Not on file    Relationship status: Not on file  . Intimate partner violence:    Fear of current or ex partner: Not on file    Emotionally abused: Not on file    Physically abused: Not on file    Forced sexual activity: Not on file  Other Topics Concern  . Not on file  Social History Narrative  . Not on file  REVIEW OF SYSTEMS: Constitutional: Weakness, no profound fatigue. ENT:  No nose bleeds Pulm: Chronic cough, chronic dyspnea which is stable.  Not on home oxygen. CV:  No palpitations, no LE edema.  GU:  No hematuria,  no frequency GI:  Per HPI Heme: Denies unusual bleeding or bruising. Transfusions: None ever. Neuro:  No headaches, no peripheral tingling or numbness.  Did not feel dizzy, just his legs got very weak and he fell.  No seizures. Derm:  No itching, no rash or sores.  Endocrine:  No sweats or chills.  No polyuria or dysuria Immunization: Reviewed Travel:  None beyond local counties in last few months.    PHYSICAL EXAM: Vital signs in last 24 hours: Vitals:   06/21/18 1236 06/21/18 1238  BP: 113/61   Pulse: 85   Resp: 20   Temp:  98.1 F (36.7 C)  SpO2: 99%    Wt Readings from Last 3 Encounters:  06/21/18 68.7 kg  05/23/18 67.6 kg  02/07/18 67.6 kg    General: Chronically ill looking, alert, comfortable, coughing AAM Head: No facial asymmetry or swelling.  No signs of head trauma. Eyes: No scleral icterus.  No conjunctival pallor.  EOMI. Ears: Not hard of hearing Nose: No congestion, no discharge. Mouth: Oropharynx moist, pink, clear.  Tongue midline. Neck: No JVD, no masses, no thyromegaly. Lungs: Greatly diminished breath sounds throughout the lung fields.  Loose cough.  Slight dyspnea with speaking. Heart: RRR.  No MRG.  S1, S2 present. Abdomen: Soft.  Active bowel sounds.  No distention.  No HSM, masses, bruits, hernias.  Mild tenderness around the periumbilical area and lower abdomen.  No guarding or rebound..   Rectal: Deferred. Musc/Skeltl: No joint redness, swelling. Extremities: No CCE.  Feet are warm. Neurologic: Alert.  Oriented x3.  No tremors, no asterixis.  Moves all 4 limbs, limb strength not tested. Skin: Dry.  No suspicious lesions, sores, rashes. Tattoos: None observed. Nodes: No cervical or inguinal adenopathy. Psych: Pleasant, cooperative.  Intake/Output from previous day: 08/28 0701 - 08/29 0700 In: 0  Out: 300 [Emesis/NG output:300] Intake/Output this shift: No intake/output data recorded.  LAB RESULTS: Recent Labs    06/21/18 0237  06/21/18 0311 06/21/18 1046  WBC 6.6  --  4.6  HGB 11.0* 11.6* 8.1*  HCT 32.1* 34.0* 23.7*  PLT 188  --  155   BMET Lab Results  Component Value Date   NA 132 (L) 06/21/2018   NA 134 (L) 06/21/2018   NA 135 12/08/2017   K 3.5 06/21/2018   K 3.5 06/21/2018   K 3.5 12/08/2017   CL 93 (L) 06/21/2018   CL 94 (L) 06/21/2018   CL 101 12/08/2017   CO2 25 06/21/2018   CO2 20 (L) 12/08/2017   CO2 23 03/26/2017   GLUCOSE 94 06/21/2018   GLUCOSE 100 (H) 06/21/2018   GLUCOSE 111 (H) 12/08/2017   BUN 38 (H) 06/21/2018   BUN 31 (H) 06/21/2018   BUN 7 12/08/2017   CREATININE 1.00 06/21/2018   CREATININE 0.96 06/21/2018   CREATININE 0.83 12/08/2017   CALCIUM 9.5 06/21/2018   CALCIUM 9.6 12/08/2017   CALCIUM 9.3 03/26/2017   LFT Recent Labs    06/21/18 0237  PROT 6.7  ALBUMIN 3.7  AST 31  ALT 26  ALKPHOS 62  BILITOT 0.7   PT/INR Lab Results  Component Value Date   INR 1.03 06/21/2018   INR 0.94 12/08/2017   INR 1.04 09/16/2015   Hepatitis Panel No  results for input(s): HEPBSAG, HCVAB, HEPAIGM, HEPBIGM in the last 72 hours. C-Diff No components found for: CDIFF Lipase     Component Value Date/Time   LIPASE 29 12/08/2017 0916    Drugs of Abuse  No results found for: LABOPIA, COCAINSCRNUR, LABBENZ, AMPHETMU, THCU, LABBARB   RADIOLOGY STUDIES: Dg Chest Port 1 View  Result Date: 06/21/2018 CLINICAL DATA:  67 y/o M; hematemesis, vomiting, and shortness of breath tonight with abdominal pain. EXAM: PORTABLE CHEST 1 VIEW COMPARISON:  12/08/2017 chest radiograph and chest CT FINDINGS: Stable normal cardiac silhouette given projection and technique. Aortic atherosclerosis with calcification. Clear lungs. No pleural effusion or pneumothorax. Numerous chronic right-sided rib fractures and right mid clavicle fracture. No acute osseous abnormality is identified. No air under diaphragm identified on this semi erect film. IMPRESSION: No active disease. Electronically Signed   By:  Kristine Garbe M.D.   On: 06/21/2018 04:32   Dg Abd Portable 1v  Result Date: 06/21/2018 CLINICAL DATA:  67 y/o M; hematemesis, vomiting, and shortness of breath with abdominal pain. EXAM: PORTABLE ABDOMEN - 1 VIEW COMPARISON:  12/08/2017 CT abdomen and pelvis. FINDINGS: The bowel gas pattern is normal. No radio-opaque calculi or other significant radiographic abnormality are seen. No pneumoperitoneum identified on this upright abdominal radiograph. IMPRESSION: Normal bowel gas pattern.  No pneumoperitoneum identified. Electronically Signed   By: Kristine Garbe M.D.   On: 06/21/2018 04:33     IMPRESSION:   *  Hematemesis.  Given his history of heavy drinking the day before onset, I suspect he has alcoholic gastritis, may have chronic GERD.  He does drink a lot of alcohol so portal gastropathy, esophageal or gastric varices are in the differential.  *   ETOH abuse, drank more than usual on 8/27.   No evidence cirrhosis on CT in 11/2017.    *   Blood loss anemia.   *   Adenomatous colon polyps 12/2016.  rescreening due 12/2019.     PLAN:     *  EGD tomorrow.  He can have clear liquids up until midnight tonight starting after the ultrasound is completed.  *  Ultrasound and doppler liver have been ordered for this afternoon, if no cirrhosis, stop octreotide.  Continue PPI IV BID.     Azucena Freed  06/21/2018, 1:52 PM Phone [phone]   Attending physician's note   I have taken an interval history, reviewed the chart and examined the patient. I agree with the Advanced Practitioner's note, impression, and recommendations, with additional recommendations pending endoscopic findings. No clear serologic evidence of impaired hepatic synthetic function and liver appeared normal on CT in February 2019. However, in light of heavy EtOH use, reasonable to have empirically started on Abx and Octreotide in acute setting with UGIB.  RUQ [place] ordered and if w/o radiographic e/o  cirrhosis, ok to stop Octreotide and ABx from GI standpoint. Resume high dose acid suppression therapy with serial CBCs and transfusion per protocol. Clears ok now with NPO and MN for EGD with MAC tomorrow.    [address] DO, FACG [phone] office

## 2018-06-21 NOTE — H&P (Addendum)
[facility] Admission History and Physical Service Pager: [phone]  Patient name: Lucas [name] Medical record number: [id number] Date of birth: Apr 05, 1951 Age: 67 y.o. Gender: male  Primary Care Provider: Steve Rattler, DO Consultants: None- GI in the am  Code Status: Full   Chief Complaint: Emesis   Assessment and Plan: AVAN GULLETT is a 67 y.o. male presenting with new onset hematemesis and melena. PMH is significant for COPD, hypertension, hyperlipidemia, tobacco use, and alcohol use.   GI bleed: Concern for upper GI bleed in the setting of hematemesis, noted to be maroon by ED physician, and melena for the past 24 hours with elevated BUN/Cr ratio (BUN 31, Cr 0.96). His hemoglobin is stable at 11, however previously 13.7 in 11/2017 and general baseline appears around 12. Could consider esophagitis or gastritis with ulcer formation in the setting of frequent alcohol use and potential overuse of NSAIDs in addition to daily low dose aspirin. Given his alcohol use, must consider variceal bleeding, however pt has no previous history of liver cirrhosis, no ascites or splenomegaly on exam, and liver synthetic function is preserved with platelets 188, albumin 3.7, and PT/INR 13.4/1.03. AST/ALT and bilirubin wnl. Mallory weiss tear unlikely as patient notes "black" emesis with onset, rather after several bouts precipitating a tear. Low concern for abdominal obstruction or perforation, normal bowel gas pattern noted on abdomen XR and non-tender on exam. He is currently hemodynamically stable and s/p octreotide, pepcid, 1 dose of ciprofloxacin, and 2L NS fluids in the ED. Last episode of emesis around 2am.  -Admit to Step-down; attending Dr. McDiarmid  - monitor on telemetry -Consult GI in the am; appreciate recs  -S/p ciprofloxacin in the ED   - NG tube ordered by ED, will hold off on this pending GI recs -Cont octreotide infusion, initiated in the ED,  Will continue for  now and follow up GI recs   -S/p 2L NS bolus; cont 100 ml/hr NS mIVF -Zofran q6 hours as needed for nausea -Obtain FOBT -Monitor CBC and CMP   - q8H CBC to trend Hgb -Two large bore IV's in place -Diet NPO   COPD: Currently denies any dyspnea or coughing with sputum production. Recently saw his PCP on 7/31, who started him on Singulair to help with allergic component of triggers. Also takes umeclidinium daily and albuterol as needed for control. Has not required hospitalizations in the past for his COPD. On 1L [place] in the ED, however satting 100%.  -Cont home umeclidinium  -Cont home singulair  -Loratadine 10mg  daily (in place of home cetirizine)  -Albuterol PRN  -Wean oxygen as tolerated   Hypertension: Normotensive/ low 884'Z systolic at bedside on admission, likely component of hypovolemia in the setting of recent emesis. Takes candesartan-HCTZ at home with good control.  -Monitor BP  -Hold home candesartan-HCTZ 16-12.5mg  given normotensive-softer BPs  Alcohol use: Endorses drinking 3-4 16oz beers daily. Additionally, does admit to drinking a pint of 100% vodka two days ago. -CIWA protocol with ativan -Thiamine, folate, and multivitamin   Hyponatremia: Na 134 on admit. Appears chronic, baseline around 130. Likely in the setting of chronic alcohol use.  -Cont to monitor CMP  -mIVF as above   Hyperlipidemia: Last lipid panel in 2017, LDL 120. Takes rosuvastatin 20 mg at home.  -Cont home rosuvastatin   Tobacco Use: 1/2 PPD.  -Monitor if nicotine patch is needed    FEN/GI: NPO, mIVF 100 ml/hr  Prophylaxis: None in setting of acute bleed,  SCDs   Disposition: Admit to step down   History of Present Illness:  Lucas [name] is a 67 y.o. male, with a past history significant for daily alcohol use, HTN, and COPD, presenting with hematemesis and melena for the past 24 hours. He states he started having abdominal pain associated with nausea yesterday morning, and had several bouts of  "black" emesis, over 10 episodes per patient, with last episode around 2am this morning. He also notes  black stools for the past day as well, but hasn't had a bowel movement since earlier yesterday. He denies any current abdominal pain, just feeling nauseous. Denies any fever, chills, SOB, or change in bowel movement consistency. Of note, he drinks approximately 2-3 16oz beers daily, and endorses drinking 1 pint of 100% proof smirnoff 2 days ago. Last drink was yesterday pm, 2-3 beers. He has never had any dark/bloody emesis or stools in the past prior to this. He takes a daily low dose aspirin, and occasional ibuprofen for joint pain, however denies daily use of additional ibuprofen.  Additionally note that naproxen is prescribed in the EMR, but did not specifically ask about this medication.  On arrival to the ED, he was hemodynamically stable, but ill appearing with active hematemesis, noted to be maroon colored by ED physician. Abdominal XR without normal bowel gas pattern, no pneumoperitoneum. CXR no active disease. CBC and CMP notable for Na 134, BUN 31, Cr 0.96, AST/ALT 31/26, Hgb 11, and plts 188. PT/INR wnl. EKG NSR. Received octreotide, ciprofloxacin, reglan, and 2L NS fluids in the ED.   Review Of Systems: Per HPI with the following additions:   Review of Systems  Constitutional: Negative for chills, fever and weight loss.  HENT: Negative for nosebleeds and sore throat.   Eyes: Negative for blurred vision and double vision.  Respiratory: Negative for cough, sputum production and shortness of breath.   Cardiovascular: Negative for chest pain, palpitations and orthopnea.  Gastrointestinal: Positive for abdominal pain, melena, nausea and vomiting. Negative for constipation and diarrhea.  Genitourinary: Negative for dysuria and urgency.  Neurological: Negative for dizziness and headaches.    Patient Active Problem List   Diagnosis Date Noted  . Tubular adenoma of colon 01/24/2017  .  Healthcare maintenance 10/13/2016  . HTN (hypertension) 02/13/2013  . Ganglion cyst 01/03/2012  . PERIPHERAL NEUROPATHY 07/15/2010  . Alcohol abuse 05/14/2009  . DEFORMITY, FOOT NEC, CONGENITAL 08/22/2007  . ALLERGIC RHINITIS, SEASONAL 05/03/2007  . Hyperlipidemia 12/21/2006  . TOBACCO DEPENDENCE 12/21/2006  . COPD, mild ([place]) 12/21/2006    Past Medical History: Past Medical History:  Diagnosis Date  . Allergy   . COPD 12/21/2006  . Environmental allergies   . HYPERLIPIDEMIA 12/21/2006  . HYPERTENSION, BENIGN SYSTEMIC 12/21/2006  . TOBACCO DEPENDENCE 12/21/2006    Past Surgical History: Past Surgical History:  Procedure Laterality Date  . ANKLE SURGERY     left ankle  . APPLICATION OF WOUND VAC  01/06/2012   Procedure: APPLICATION OF WOUND VAC;  Surgeon: Meredith Pel, MD;  Location: [facility];  Service: Orthopedics;  Laterality: Left;  . APPLICATION OF WOUND VAC Right 10/17/2015   Procedure: APPLICATION OF WOUND VAC;  Surgeon: Leandrew Koyanagi, MD;  Location: [place];  Service: Orthopedics;  Laterality: Right;  . EXTERNAL FIXATION LEG  01/06/2012   Procedure: EXTERNAL FIXATION LEG;  Surgeon: Meredith Pel, MD;  Location: [facility];  Service: Orthopedics;  Laterality: Left;  Smith-nephew external fixator set  . EXTERNAL FIXATION LEG Right  09/16/2015   Procedure: EXTERNAL FIXATION LEG/LARGE;  Surgeon: Mcarthur Rossetti, MD;  Location: [place];  Service: Orthopedics;  Laterality: Right;  . EXTERNAL FIXATION REMOVAL Right 09/22/2015   Procedure: REMOVAL EXTERNAL FIXATION LEG;  Surgeon: Mcarthur Rossetti, MD;  Location: [place];  Service: Orthopedics;  Laterality: Right;  . FASCIOTOMY  01/10/2012   Procedure: FASCIOTOMY;  Surgeon: Rozanna Box, MD;  Location: [place];  Service: Orthopedics;  Laterality: Left;  status post fasciotomy with wound vac left calf; adjustment external fixator left tibia under fluoro; retention suture/wound vac placement left lateral calf wound  .  FASCIOTOMY  01/06/2012   Procedure: FASCIOTOMY;  Surgeon: Meredith Pel, MD;  Location: [facility];  Service: Orthopedics;  Laterality: Left;  Four compartment fasciotomy  . I&D EXTREMITY  01/12/2012   Procedure: IRRIGATION AND DEBRIDEMENT EXTREMITY;  Surgeon: Rozanna Box, MD;  Location: [place];  Service: Orthopedics;  Laterality: Left;  LAYERD CLOSURE LEFT LEG WOUND  . I&D EXTREMITY Right 10/17/2015   Procedure: IRRIGATION AND DEBRIDEMENT EXTREMITY;  Surgeon: Leandrew Koyanagi, MD;  Location: [place];  Service: Orthopedics;  Laterality: Right;  . I&D EXTREMITY Right 10/20/2015   Procedure: REPEAT IRRIGATION AND DEBRIDEMENT EXTREMITY, right lower leg;  Surgeon: Mcarthur Rossetti, MD;  Location: [place];  Service: Orthopedics;  Laterality: Right;  . KNEE SURGERY     left knee and right knee  . MASS EXCISION Left 09/22/2015   Procedure: EXCISION MASS;  Surgeon: Mcarthur Rossetti, MD;  Location: [place];  Service: Orthopedics;  Laterality: Left;  . ORIF TIBIA PLATEAU  02/07/2012   Procedure: OPEN REDUCTION INTERNAL FIXATION (ORIF) TIBIAL PLATEAU;  Surgeon: Rozanna Box, MD;  Location: [place];  Service: Orthopedics;  Laterality: Left;  ORIF LEFT TIBIAL PLATEAU/ REMOVE EXTERNAL FIXATION LEFT LEG  . ORIF TIBIA PLATEAU Right 09/22/2015   Procedure: REMOVAL OF EXTERNAL FIXATOR RIGHT LEG, OPEN REDUCTION INTERNAL FIXATION (ORIF) RIGHT TIBIAL PLATEAU, EXCISION SMALL MASS LEFT LEG;  Surgeon: Mcarthur Rossetti, MD;  Location: [place];  Service: Orthopedics;  Laterality: Right;  . SKIN SPLIT GRAFT Right 10/23/2015   Procedure: RIGHT GASTROC FLAP SPLIT THICKNESS SKIN GRAFT FROM RIGHT THING TO RIGHT LEG;  Surgeon: Irene Limbo, MD;  Location: [place];  Service: Plastics;  Laterality: Right;  . WRIST SURGERY     left    Social History: Social History   Tobacco Use  . Smoking status: Current Every Day Smoker    Packs/day: 0.50    Years: 51.00    Pack years: 25.50    Types: Cigarettes  . Smokeless  tobacco: Former User    Types: Snuff, Sarina Ser    Quit date: 01/05/1965  Substance Use Topics  . Alcohol use: Yes    Alcohol/week: 3.0 - 4.0 standard drinks    Types: 3 - 4 Cans of beer per week    Comment: 3-4 cans beer daily  . Drug use: No   Additional social history: 1/2 PPD smoker   Please also refer to relevant sections of EMR.  Family History: Family History  Problem Relation Age of Onset  . Emphysema Father     Allergies and Medications: Allergies  Allergen Reactions  . Ace Inhibitors Swelling  . Ampicillin Nausea And Vomiting  . Penicillins Nausea And Vomiting  . Codeine Nausea And Vomiting  . Lisinopril Swelling    REACTION: angioedema   No current facility-administered medications on file prior to encounter.    Current Outpatient Medications on  File Prior to Encounter  Medication Sig Dispense Refill  . albuterol (PROVENTIL HFA;VENTOLIN HFA) 108 (90 Base) MCG/ACT inhaler INHALE 2 PUFFS INTO THE LUNGS EVERY 4 HOURS AS NEEDED FOR WHEEZING OR SHORTNESS OF BREATH 18 g 6  . aspirin EC 81 MG tablet Take 1 tablet (81 mg total) by mouth daily. 90 tablet 2  . candesartan-hydrochlorothiazide (ATACAND HCT) 16-12.5 MG tablet Take 1 tablet by mouth daily. 90 tablet 3  . cetirizine (ZYRTEC) 10 MG tablet Take 1 tablet (10 mg total) by mouth daily. 30 tablet 11  . ibuprofen (ADVIL,MOTRIN) 200 MG tablet Take 400 mg by mouth every 6 (six) hours as needed for mild pain.    . montelukast (SINGULAIR) 10 MG tablet Take 1 tablet (10 mg total) by mouth at bedtime. 30 tablet 3  . naproxen (NAPROSYN) 375 MG tablet Take 1 tablet (375 mg total) by mouth 2 (two) times daily. 20 tablet 0  . rosuvastatin (CRESTOR) 20 MG tablet Take 1 tablet (20 mg total) by mouth daily. 90 tablet 3  . umeclidinium bromide (INCRUSE ELLIPTA) 62.5 MCG/INH AEPB Inhale 1 puff into the lungs daily. 90 each 3  . [DISCONTINUED] loratadine (CLARITIN) 10 MG tablet Take 1 tablet (10 mg total) by mouth daily. 30 tablet 6     Objective: BP 108/64   Pulse 86   Temp 98.2 F (36.8 C) (Oral)   Resp (!) 21   Ht 5\' 11"  (1.803 m)   Wt 66.2 kg   SpO2 100%   BMI 20.36 kg/m  Exam: General: Alert, NAD, sitting up answering questions  HEENT: NCAT, MM dry, oropharynx slightly erythematous   Cardiac: RRR no m/g/r Lungs: Clear bilaterally with poor air movement, no increased WOB, on 1L [place] satting well distant breath sounds, no wheezes/rhonchi/rales Abdomen: soft, non-tender, non-distended, normoactive BS Msk: Moves all extremities spontaneously  Neuro: Alert and oriented, answering questions appropriately. Follows commands. Pupils equal and reactive, slight horizontal nystagmus noted occasionally. No focal neuro deficits noted, 5/5 upper and lower strength. CN 2-12 intact.    Ext: Warm, dry, 2+ distal pulses, no edema  Psych: appropriate affect Derm: no rash or bruising noted   Labs and Imaging: CBC BMET  Recent Labs  Lab 06/21/18 0237 06/21/18 0311  WBC 6.6  --   HGB 11.0* 11.6*  HCT 32.1* 34.0*  PLT 188  --    Recent Labs  Lab 06/21/18 0237 06/21/18 0311  NA 134* 132*  K 3.5 3.5  CL 94* 93*  CO2 25  --   BUN 31* 38*  CREATININE 0.96 1.00  GLUCOSE 100* 94  CALCIUM 9.5  --      Dg Chest Port 1 View  Result Date: 06/21/2018 CLINICAL DATA:  67 y/o M; hematemesis, vomiting, and shortness of breath tonight with abdominal pain. EXAM: PORTABLE CHEST 1 VIEW COMPARISON:  12/08/2017 chest radiograph and chest CT FINDINGS: Stable normal cardiac silhouette given projection and technique. Aortic atherosclerosis with calcification. Clear lungs. No pleural effusion or pneumothorax. Numerous chronic right-sided rib fractures and right mid clavicle fracture. No acute osseous abnormality is identified. No air under diaphragm identified on this semi erect film. IMPRESSION: No active disease. Electronically Signed   By: Kristine Garbe M.D.   On: 06/21/2018 04:32   Dg Abd Portable 1v  Result Date:  06/21/2018 CLINICAL DATA:  67 y/o M; hematemesis, vomiting, and shortness of breath with abdominal pain. EXAM: PORTABLE ABDOMEN - 1 VIEW COMPARISON:  12/08/2017 CT abdomen and pelvis. FINDINGS: The bowel  gas pattern is normal. No radio-opaque calculi or other significant radiographic abnormality are seen. No pneumoperitoneum identified on this upright abdominal radiograph. IMPRESSION: Normal bowel gas pattern.  No pneumoperitoneum identified. Electronically Signed   By: Kristine Garbe M.D.   On: 06/21/2018 04:33    Patriciaann Clan, DO 06/21/2018, 4:47 AM PGY-1, [place] Intern pager: [phone], text pages welcome  I have separately seen and examined the patient. I have discussed the findings and exam with Dr. Higinio Plan and agree with the above note.  My changes/additions are outlined in BLUE.   Everrett Coombe, MD PGY-3 [place] [facility]

## 2018-06-21 NOTE — ED Notes (Signed)
X-ray at bedside

## 2018-06-21 NOTE — ED Triage Notes (Signed)
Pt presents from home with bloody emesis and dark stools x 24 hrs; pt is a daily drinker of beer; pt abd tender in all quadrants; skin and mucous membranes are pale; pt tachy with EMS

## 2018-06-21 NOTE — Progress Notes (Signed)
FPTS Interim Progress Note  S: Examined patient. He states he is feeling better. He was getting an albuterol nebulizer treatment when I first arrived. He denies vomiting since arriving to floor. He had one BM this morning described by the tech as "not diarrhea, but loose. No blood."   O: BP 113/61   Pulse 85   Temp 98.1 F (36.7 C) (Oral)   Resp 20   Ht 5\' 11"  (1.803 m)   Wt 68.7 kg   SpO2 99%   BMI 21.12 kg/m    CBC Latest Ref Rng & Units 06/21/2018 06/21/2018 06/21/2018  WBC 4.0 - 10.5 K/uL 4.6 - 6.6  Hemoglobin 13.0 - 17.0 g/dL 8.1(L) 11.6(L) 11.0(L)  Hematocrit 39.0 - 52.0 % 23.7(L) 34.0(L) 32.1(L)  Platelets 150 - 400 K/uL 155 - 188   CMP     Component Value Date/Time   NA 132 (L) 06/21/2018 0311   K 3.5 06/21/2018 0311   CL 93 (L) 06/21/2018 0311   CO2 25 06/21/2018 0237   GLUCOSE 94 06/21/2018 0311   BUN 38 (H) 06/21/2018 0311   CREATININE 1.00 06/21/2018 0311   CREATININE 0.90 01/28/2014 1438   CALCIUM 9.5 06/21/2018 0237   PROT 6.7 06/21/2018 0237   ALBUMIN 3.7 06/21/2018 0237   AST 31 06/21/2018 0237   ALT 26 06/21/2018 0237   ALKPHOS 62 06/21/2018 0237   BILITOT 0.7 06/21/2018 0237   GFRNONAA >60 06/21/2018 0237   GFRAA >60 06/21/2018 0237   A/P: GI bleed. Hgb significant drop from 11.6 to 8.1 today -continue to monitor Hgb and hemodynamic stability - ordered liver US -continue IV octreotide pending liver US -continue protonix - consulted GI- appreciate recs  Richarda Osmond, DO 06/21/2018, 1:20 PM PGY-1, [place] [facility] pager [phone]

## 2018-06-21 NOTE — ED Notes (Signed)
Patient Wife Lucas [name] cell number [phone] and home number [phone] wants to be updated when patient is admitted.

## 2018-06-21 NOTE — Progress Notes (Addendum)
Patient c/o chest pain. Patient speaking to [place] with c/o SOB a little tachypneic up to 30's for a few seconds. GI DO in room recommended EKG patient was diaphoretic with O2 sats stable 95% and above. Respiratory therapist in room had given a breathing treatment. Patient did sound tight in upper lobes.  EKG showed NSR. VSS. Paged family med pager awaiting call back will continue to monitor. Pain and breathing improving.

## 2018-06-21 NOTE — ED Notes (Signed)
Attempt at placing NG tube unsuccessful; pt unable to tolerate and refusing further attempts, [place] aware

## 2018-06-22 ENCOUNTER — Inpatient Hospital Stay (HOSPITAL_COMMUNITY): Payer: Medicare HMO | Admitting: Certified Registered Nurse Anesthetist

## 2018-06-22 ENCOUNTER — Encounter (HOSPITAL_COMMUNITY): Payer: Self-pay

## 2018-06-22 ENCOUNTER — Encounter (HOSPITAL_COMMUNITY)
Admission: EM | Disposition: A | Discharge status: Home / Self Care | Payer: Self-pay | Source: Home / Self Care | Attending: Family Medicine

## 2018-06-22 DIAGNOSIS — K226 Gastro-esophageal laceration-hemorrhage syndrome: Secondary | ICD-10-CM

## 2018-06-22 DIAGNOSIS — K3189 Other diseases of stomach and duodenum: Secondary | ICD-10-CM

## 2018-06-22 DIAGNOSIS — B9681 Helicobacter pylori [H. pylori] as the cause of diseases classified elsewhere: Secondary | ICD-10-CM

## 2018-06-22 DIAGNOSIS — K7460 Unspecified cirrhosis of liver: Secondary | ICD-10-CM

## 2018-06-22 DIAGNOSIS — K449 Diaphragmatic hernia without obstruction or gangrene: Secondary | ICD-10-CM

## 2018-06-22 DIAGNOSIS — K2970 Gastritis, unspecified, without bleeding: Secondary | ICD-10-CM

## 2018-06-22 DIAGNOSIS — K2990 Gastroduodenitis, unspecified, without bleeding: Secondary | ICD-10-CM

## 2018-06-22 DIAGNOSIS — K7030 Alcoholic cirrhosis of liver without ascites: Secondary | ICD-10-CM

## 2018-06-22 HISTORY — PX: HOT HEMOSTASIS: SHX5433

## 2018-06-22 HISTORY — PX: ESOPHAGOGASTRODUODENOSCOPY (EGD) WITH PROPOFOL: SHX5813

## 2018-06-22 HISTORY — PX: BIOPSY: SHX5522

## 2018-06-22 LAB — CBC
HCT: 23.9 % — ABNORMAL LOW (ref 39.0–52.0)
HCT: 24.7 % — ABNORMAL LOW (ref 39.0–52.0)
HEMOGLOBIN: 7.9 g/dL — AB (ref 13.0–17.0)
Hemoglobin: 8.1 g/dL — ABNORMAL LOW (ref 13.0–17.0)
MCH: 32.5 pg (ref 26.0–34.0)
MCH: 32.4 pg (ref 26.0–34.0)
MCHC: 33.1 g/dL (ref 30.0–36.0)
MCHC: 32.8 g/dL (ref 30.0–36.0)
MCV: 98.4 fL (ref 78.0–100.0)
MCV: 98.8 fL (ref 78.0–100.0)
Platelets: 165 10*3/uL (ref 150–400)
Platelets: 165 10*3/uL (ref 150–400)
RBC: 2.43 MIL/uL — AB (ref 4.22–5.81)
RBC: 2.5 MIL/uL — AB (ref 4.22–5.81)
RDW: 13.2 % (ref 11.5–15.5)
RDW: 13.2 % (ref 11.5–15.5)
WBC: 4.4 10*3/uL (ref 4.0–10.5)
WBC: 3.7 10*3/uL — AB (ref 4.0–10.5)

## 2018-06-22 LAB — COMPREHENSIVE METABOLIC PANEL
ALT: 21 U/L (ref 0–44)
ANION GAP: 4 — AB (ref 5–15)
AST: 29 U/L (ref 15–41)
Albumin: 3.2 g/dL — ABNORMAL LOW (ref 3.5–5.0)
Alkaline Phosphatase: 50 U/L (ref 38–126)
BUN: 14 mg/dL (ref 8–23)
CALCIUM: 8.8 mg/dL — AB (ref 8.9–10.3)
CHLORIDE: 104 mmol/L (ref 98–111)
CO2: 28 mmol/L (ref 22–32)
Creatinine, Ser: 0.87 mg/dL (ref 0.61–1.24)
GFR calc non Af Amer: >60 mL/min (ref >60)
Glucose, Bld: 121 mg/dL — ABNORMAL HIGH (ref 70–99)
Potassium: 4.6 mmol/L (ref 3.5–5.1)
SODIUM: 136 mmol/L (ref 135–145)
Total Bilirubin: 0.9 mg/dL (ref 0.3–1.2)
Total Protein: 5.7 g/dL — ABNORMAL LOW (ref 6.5–8.1)

## 2018-06-22 LAB — TROPONIN I
TROPONIN I: 0.04 ng/mL — AB (ref <0.03)
Troponin I: <0.03 ng/mL (ref <0.03)

## 2018-06-22 LAB — PROTIME-INR
INR: 1.15
PROTHROMBIN TIME: 14.7 s (ref 11.4–15.2)

## 2018-06-22 LAB — HIV ANTIBODY (ROUTINE TESTING W REFLEX): HIV Screen 4th Generation wRfx: NONREACTIVE

## 2018-06-22 SURGERY — ESOPHAGOGASTRODUODENOSCOPY (EGD) WITH PROPOFOL
Anesthesia: Monitor Anesthesia Care

## 2018-06-22 MED ORDER — PHENYLEPHRINE 40 MCG/ML (10ML) SYRINGE FOR IV PUSH (FOR BLOOD PRESSURE SUPPORT)
PREFILLED_SYRINGE | INTRAVENOUS | Status: DC | PRN
  Administered 2018-06-22 (×3): 80 ug via INTRAVENOUS

## 2018-06-22 MED ORDER — LIDOCAINE 2% (20 MG/ML) 5 ML SYRINGE
INTRAMUSCULAR | Status: DC | PRN
  Administered 2018-06-22: 40 mg via INTRAVENOUS

## 2018-06-22 MED ORDER — EPINEPHRINE PF 1 MG/10ML IJ SOSY
PREFILLED_SYRINGE | INTRAMUSCULAR | Status: AC
  Filled 2018-06-22: qty 10

## 2018-06-22 MED ORDER — PROPOFOL 10 MG/ML IV BOLUS
INTRAVENOUS | Status: DC | PRN
  Administered 2018-06-22 (×2): 20 mg via INTRAVENOUS
  Administered 2018-06-22: 25 mg via INTRAVENOUS
  Administered 2018-06-22: 20 mg via INTRAVENOUS

## 2018-06-22 MED ORDER — PROPOFOL 500 MG/50ML IV EMUL
INTRAVENOUS | Status: DC | PRN
  Administered 2018-06-22: 100 ug/kg/min via INTRAVENOUS

## 2018-06-22 MED ORDER — SODIUM CHLORIDE 0.9 % IJ SOLN
PREFILLED_SYRINGE | INTRAMUSCULAR | Status: DC | PRN
  Administered 2018-06-22: 4 mL

## 2018-06-22 MED ORDER — LACTATED RINGERS IV SOLN
INTRAVENOUS | Status: DC | PRN
  Administered 2018-06-22: 09:00:00 via INTRAVENOUS

## 2018-06-22 MED ORDER — ONDANSETRON HCL 4 MG/2ML IJ SOLN
INTRAMUSCULAR | Status: AC
  Filled 2018-06-22: qty 2

## 2018-06-22 SURGICAL SUPPLY — 15 items

## 2018-06-22 NOTE — Anesthesia Preprocedure Evaluation (Signed)
Anesthesia Evaluation  Patient identified by MRN, date of birth, ID band Patient awake    Reviewed: Allergy & Precautions, NPO status , Patient's Chart, lab work & pertinent test results  Airway Mallampati: II  TM Distance: >3 FB Neck ROM: Full    Dental no notable dental hx.    Pulmonary COPD, Current Smoker,    Pulmonary exam normal breath sounds clear to auscultation       Cardiovascular hypertension, Normal cardiovascular exam Rhythm:Regular Rate:Normal     Neuro/Psych negative neurological ROS  negative psych ROS   GI/Hepatic negative GI ROS, (+)     substance abuse  alcohol use,   Endo/Other  negative endocrine ROS  Renal/GU negative Renal ROS  negative genitourinary   Musculoskeletal negative musculoskeletal ROS (+)   Abdominal   Peds negative pediatric ROS (+)  Hematology  (+) anemia ,   Anesthesia Other Findings   Reproductive/Obstetrics negative OB ROS                             Anesthesia Physical Anesthesia Plan  ASA: III  Anesthesia Plan: MAC   Post-op Pain Management:    Induction: Intravenous  PONV Risk Score and Plan: 0  Airway Management Planned: Simple Face Mask  Additional Equipment:   Intra-op Plan:   Post-operative Plan:   Informed Consent: I have reviewed the patients History and Physical, chart, labs and discussed the procedure including the risks, benefits and alternatives for the proposed anesthesia with the patient or authorized representative who has indicated his/her understanding and acceptance.   Dental advisory given  Plan Discussed with: CRNA and Surgeon  Anesthesia Plan Comments:         Anesthesia Quick Evaluation

## 2018-06-22 NOTE — Progress Notes (Signed)
CRITICAL VALUE ALERT  Critical Value:  Troponin 0.04  Date & Time Notied:  06/22/2018; 08:17  Provider Notified: [facility]  Orders Received/Actions taken:

## 2018-06-22 NOTE — Progress Notes (Signed)
Pt noted to be mildly anxious,  Tachycardic(HR >130S) and Tachypneic (in upper 20s). Pt sweating profusely. Pt was assisted to a sitting position in bed. Rapid response and provider notified

## 2018-06-22 NOTE — Op Note (Signed)
[facility] Patient Name: Lucas [name] Procedure Date : 06/22/2018 MRN: [id number] Attending MD: Gerrit Heck , MD Date of Birth: 03-16-1951 CSN: [id number] Age: 67 Admit Type: Inpatient Procedure:                Upper GI endoscopy Indications:              Acute post hemorrhagic anemia, Coffee-ground                            emesis, Hematemesis, Nausea with vomiting Providers:                Gerrit Heck, MD, Baird Cancer, RN, William Dalton, Technician Referring MD:              Medicines:                Monitored Anesthesia Care Complications:            No immediate complications. Estimated Blood Loss:     Estimated blood loss was minimal. Procedure:                Pre-Anesthesia Assessment:                           - Prior to the procedure, a History and Physical                            was performed, and patient medications and                            allergies were reviewed. The patient's tolerance of                            previous anesthesia was also reviewed. The risks                            and benefits of the procedure and the sedation                            options and risks were discussed with the patient.                            All questions were answered, and informed consent                            was obtained. Prior Anticoagulants: The patient has                            taken aspirin, last dose was 2 days prior to                            procedure. ASA Grade Assessment: III - A patient  with severe systemic disease. After reviewing the                            risks and benefits, the patient was deemed in                            satisfactory condition to undergo the procedure.                           After obtaining informed consent, the endoscope was                            passed under direct vision. Throughout the   procedure, the patient's blood pressure, pulse, and                            oxygen saturations were monitored continuously. The                            GIF-H190 ([id number]) Olympus Adult EGD was introduced                            through the mouth, and advanced to the second part                            of duodenum. The upper GI endoscopy was                            accomplished without difficulty. The patient                            tolerated the procedure well. Scope In: Scope Out: Findings:      A 5 mm non-bleeding Mallory-Weiss tear with stigmata of recent bleeding       (visible vessel) was found. For hemostasis, one hemostatic clip was       successfully placed. Area was successfully injected with 4 mL of a       1:10,000 solution of epinephrine for hemostasis. There was no bleeding       at the conclusion of this study.      A medium-sized hiatal hernia was present. Hill Grade IV valve noted on       retroflexed views.      The upper third of the esophagus and middle third of the esophagus were       normal.      Localized mild inflammation characterized by erythema was found in the       gastric fundus. Biopsies were taken with a cold forceps for Helicobacter       pylori testing. Estimated blood loss was minimal.      The gastric body, gastric antrum and pylorus were normal.      Localized moderately erythematous mucosa without active bleeding and       with no stigmata of bleeding was found in the duodenal bulb. Biopsies       were taken with a cold forceps for histology. Estimated blood loss was       minimal.  The second portion of the duodenum was normal. Impression:               - Mallory-Weiss tear with high grade stigmata of                            recent bleeding. This is the etiology for the                            hematemesis and anemia. This was successfully                            treated with hemostatic clip placement and                             Epinephrine injection.                           - Medium-sized hiatal hernia.                           - Normal upper third of esophagus and middle third                            of esophagus.                           - Gastritis. Biopsied.                           - Normal gastric body, antrum and pylorus.                           - Erythematous duodenopathy. Biopsied.                           - Normal second portion of the duodenum. Recommendation:           - Return patient to hospital ward for ongoing care.                           - Clear liquid diet today.                           - No aspirin, ibuprofen, naproxen, or other                            non-steroidal anti-inflammatory drugs for 5 days.                           - Await pathology results.                           - Use Protonix (pantoprazole) 40 mg PO BID for 8                            weeks to promote mucosal healing, then can titrate  to lowest effective dose to maintain control of                            acid reflux.                           - Resume antiemetics to control nausea.                           - Use sucralfate suspension 1 gram PO QID for 2                            weeks. Procedure Code(s):        --- Professional ---                           [phone], Esophagogastroduodenoscopy, flexible,                            transoral; with biopsy, single or multiple Diagnosis Code(s):        --- Professional ---                           K22.6, Gastro-esophageal laceration-hemorrhage                            syndrome                           K44.9, Diaphragmatic hernia without obstruction or                            gangrene                           K29.70, Gastritis, unspecified, without bleeding                           K31.89, Other diseases of stomach and duodenum                           D62, Acute posthemorrhagic anemia                            K92.0, Hematemesis                           R11.2, Nausea with vomiting, unspecified CPT copyright 2017 [employer]. All rights reserved. The codes documented in this report are preliminary and upon coder review may  be revised to meet current compliance requirements. Gerrit Heck, MD 06/22/2018 9:39:41 AM Number of Addenda: 0

## 2018-06-22 NOTE — Transfer of Care (Signed)
Immediate Anesthesia Transfer of Care Note  Patient: Lucas [name]  Procedure(s) Performed: ESOPHAGOGASTRODUODENOSCOPY (EGD) WITH PROPOFOL (N/A ) BIOPSY HEMOSTASIS (N/A )  Patient Location: Endoscopy Unit  Anesthesia Type:MAC  Level of Consciousness: drowsy  Airway & Oxygen Therapy: Patient Spontanous Breathing and Patient connected to nasal cannula oxygen  Post-op Assessment: Report given to RN and Post -op Vital signs reviewed and stable  Post vital signs: Reviewed and stable  Last Vitals:  Vitals Value Taken Time  BP    Temp    Pulse    Resp    SpO2      Last Pain:  Vitals:   06/22/18 0816  TempSrc: Oral  PainSc: 0-No pain         Complications: No apparent anesthesia complications

## 2018-06-22 NOTE — Progress Notes (Cosign Needed Addendum)
Opened in error

## 2018-06-22 NOTE — Progress Notes (Addendum)
Family Medicine Teaching Service Daily Progress Note Intern Pager: [phone]  Patient name: Lucas [name] Medical record number: [id number] Date of birth: [birth year] Age: 67 y.o. Gender: male  Primary Care Provider: Steve Rattler, DO Consultants: GI Code Status: full  Pt Overview and Major Events to Date:  8/27 8/29 rapid response called  Assessment and Plan:  GI bleed: Has not had any more bloody BM or any vomiting. His Hgb has stayed stable 8.1 today. Transfusion threshold 7.0 or symptomatic  - monitor on telemetry -f/u GI recs -f/u EGD today -f/u liver US -octreotide d/c'ed   -Zofran q6 hours as needed for nausea -Monitor CBC and CMP   - q8H CBC to trend Hgb -Two large bore IV's in place -Diet NPO   COPD: turned patient's oxygen to .5L during exam and patient's saturation was >95%. Will consider turning off after EGD. -Cont home umeclidinium  -Cont home singulair  -Loratadine 10mg  daily (in place of home cetirizine)  -Albuterol PRN  -Wean oxygen as tolerated   Hypertension: Normotensive. candesartan-HCTZ at home with good control.  -Monitor BP  -Hold home candesartan-HCTZ 16-12.5mg   Alcohol use: Endorses drinking 3-4 16oz beers daily. Additionally, does admit to drinking a pint of 100% vodka two days ago. -CIWA protocol with ativan -Thiamine, folate, and multivitamin   Hyponatremia: Na corrected today. -Cont to monitor CMP  -mIVF as above   Hyperlipidemia: Last lipid panel in 2017, LDL 120. Takes rosuvastatin 20 mg at home.  -Cont home rosuvastatin   Tobacco Use: 1/2 PPD.  -Monitor if nicotine patch is needed   FEN/GI: NPO for EGD, then full diet PPx: SCDs  Disposition: telemetry  Subjective:  Overnight: patient has episode of acute onset SOB/CP/diaphoresis. Rapid response was called. Given breathing treatment, 0.5 ativan, troponins trended, EKG troponins neg x2, third is 0.04, EKG showed NSR with t wave abnormality Today:  CIWA has returned to  0 Repeat EKG has resolved t wave changes, NSR Third troponin is 0.04 Ordered an incentive spirometer Patient is feeling well- no more Chest pain, diaphoresis or SOB   Objective: Temp:  [97.6 F (36.4 C)-98.3 F (36.8 C)] 97.6 F (36.4 C) (08/30 0440) Pulse Rate:  [73-130] 73 (08/30 0440) Resp:  [16-28] 19 (08/30 0440) BP: (113-154)/(61-76) 123/68 (08/30 0440) SpO2:  [93 %-100 %] 100 % (08/30 0440)  Physical Exam: General: Sitting up in bed, on .5L oxygen [place] and sat>95%, NAD Eyes: bilateral nystagmus Cardiovascular: RRR Respiratory: mild bilateral crackles, no wheezing appreciated Abdomen: soft, non-tender to palpation, no fluid wave/ascites noted Extremities: no LE edema  Laboratory: Recent Labs  Lab 06/21/18 1836 06/21/18 2141 06/22/18 0243  WBC 4.2 5.4 4.4  HGB 8.5* 9.8* 7.9*  HCT 25.5* 29.6* 23.9*  PLT 169 192 165   Recent Labs  Lab 06/21/18 0237 06/21/18 0311 06/21/18 2141 06/22/18 0243  NA 134* 132* 140 136  K 3.5 3.5 4.1 4.6  CL 94* 93* 104 104  CO2 25  --  27 28  BUN 31* 38* 15 14  CREATININE 0.96 1.00 1.01 0.87  CALCIUM 9.5  --  9.2 8.8*  PROT 6.7  --   --  5.7*  BILITOT 0.7  --   --  0.9  ALKPHOS 62  --   --  50  ALT 26  --   --  21  AST 31  --   --  29  GLUCOSE 100* 94 130* 121*    Imaging/Diagnostic Tests: Chest xray on admission showed chronic rib  fractures- multiple right sided. And right mid-clavicular fracture. No active disease Liver US- hepatic, portal, and splenic veins are patent with normal directionality of flow EGD today pending  Richarda Osmond, DO 06/22/2018, 7:01 AM PGY-1, [facility] Intern pager: [phone], text pages welcome

## 2018-06-22 NOTE — Anesthesia Postprocedure Evaluation (Signed)
Anesthesia Post Note  Patient: Lucas [name]  Procedure(s) Performed: ESOPHAGOGASTRODUODENOSCOPY (EGD) WITH PROPOFOL (N/A ) BIOPSY HEMOSTASIS (N/A )     Patient location during evaluation: PACU Anesthesia Type: MAC Level of consciousness: awake and alert Pain management: pain level controlled Vital Signs Assessment: post-procedure vital signs reviewed and stable Respiratory status: spontaneous breathing, nonlabored ventilation, respiratory function stable and patient connected to nasal cannula oxygen Cardiovascular status: stable and blood pressure returned to baseline Postop Assessment: no apparent nausea or vomiting Anesthetic complications: no    Last Vitals:  Vitals:   06/22/18 0748 06/22/18 0816  BP: 112/69 138/83  Pulse: 74 75  Resp: 13 14  Temp: 37.1 C 36.6 C  SpO2: 98% 99%    Last Pain:  Vitals:   06/22/18 0816  TempSrc: Oral  PainSc: 0-No pain                 Embree Brawley S

## 2018-06-22 NOTE — Anesthesia Procedure Notes (Signed)
Procedure Name: MAC Date/Time: 06/22/2018 9:00 AM Performed by: Colin Benton, CRNA Pre-anesthesia Checklist: Patient identified, Suction available, Emergency Drugs available and Patient being monitored Patient Re-evaluated:Patient Re-evaluated prior to induction Oxygen Delivery Method: Nasal cannula Preoxygenation: Pre-oxygenation with 100% oxygen Induction Type: IV induction Placement Confirmation: positive ETCO2 Dental Injury: Teeth and Oropharynx as per pre-operative assessment

## 2018-06-22 NOTE — Progress Notes (Deleted)
Pt noted to bemildly anxious,  Tachycardic(HR >130S) and Tachypneic (in upper 20s). Pt sweating profusely and was assisted to a sitting position in bed. Rapid response and provider notified

## 2018-06-22 NOTE — Progress Notes (Signed)
RT called to bedside for pt in respiratory distress. Pt very diaphoretic sitting in tripod position. Pt is complaining of chest pain and difficulty breathing. Bilat BS clear, diminished. RT gave PRN albuterol neb, appears to calm pt down. Pt takes albuterol MDI at home (prescribed q4h prn) but pt states he takes it on a regular basis. Pts anxiousness has decreased with neb and RN gave Ativan as well. RT will continue to monitor.

## 2018-06-23 DIAGNOSIS — K2990 Gastroduodenitis, unspecified, without bleeding: Secondary | ICD-10-CM

## 2018-06-23 DIAGNOSIS — K2970 Gastritis, unspecified, without bleeding: Secondary | ICD-10-CM

## 2018-06-23 LAB — CBC WITH DIFFERENTIAL/PLATELET
ABS IMMATURE GRANULOCYTES: 0 10*3/uL (ref 0.0–0.1)
BASOS ABS: 0 10*3/uL (ref 0.0–0.1)
BASOS PCT: 1 %
Eosinophils Absolute: 0.1 10*3/uL (ref 0.0–0.7)
Eosinophils Relative: 3 %
HCT: 23.9 % — ABNORMAL LOW (ref 39.0–52.0)
HEMOGLOBIN: 8 g/dL — AB (ref 13.0–17.0)
Immature Granulocytes: 1 %
Lymphocytes Relative: 24 %
Lymphs Abs: 1 10*3/uL (ref 0.7–4.0)
MCH: 33.3 pg (ref 26.0–34.0)
MCHC: 33.5 g/dL (ref 30.0–36.0)
MCV: 99.6 fL (ref 78.0–100.0)
MONO ABS: 0.6 10*3/uL (ref 0.1–1.0)
MONOS PCT: 15 %
Neutro Abs: 2.3 10*3/uL (ref 1.7–7.7)
Neutrophils Relative %: 58 %
PLATELETS: 177 10*3/uL (ref 150–400)
RBC: 2.4 MIL/uL — ABNORMAL LOW (ref 4.22–5.81)
RDW: 12.8 % (ref 11.5–15.5)
WBC: 4 10*3/uL (ref 4.0–10.5)

## 2018-06-23 LAB — BASIC METABOLIC PANEL
Anion gap: 7 (ref 5–15)
BUN: 5 mg/dL — AB (ref 8–23)
CHLORIDE: 108 mmol/L (ref 98–111)
CO2: 23 mmol/L (ref 22–32)
CREATININE: 0.86 mg/dL (ref 0.61–1.24)
Calcium: 8.7 mg/dL — ABNORMAL LOW (ref 8.9–10.3)
GFR calc Af Amer: >60 mL/min (ref >60)
GFR calc non Af Amer: >60 mL/min (ref >60)
GLUCOSE: 98 mg/dL (ref 70–99)
POTASSIUM: 4 mmol/L (ref 3.5–5.1)
SODIUM: 138 mmol/L (ref 135–145)

## 2018-06-23 MED ORDER — SUCRALFATE 1 GM/10ML PO SUSP
1.0000 g | Freq: Three times a day (TID) | ORAL | Status: DC
  Administered 2018-06-23 (×2): 1 g via ORAL
  Filled 2018-06-23 (×2): qty 10

## 2018-06-23 MED ORDER — SUCRALFATE 1 GM/10ML PO SUSP: 1.0000 g | Freq: Four times a day (QID) | ORAL | 0 refills | Status: DC

## 2018-06-23 MED ORDER — PANTOPRAZOLE SODIUM 40 MG PO TBEC: 40.0000 mg | DELAYED_RELEASE_TABLET | Freq: Two times a day (BID) | ORAL | 0 refills | Status: DC

## 2018-06-23 MED ORDER — DEXTROMETHORPHAN POLISTIREX ER 30 MG/5ML PO SUER
15.0000 mg | Freq: Once | ORAL | Status: DC | PRN
  Filled 2018-06-23: qty 5

## 2018-06-23 MED ORDER — PANTOPRAZOLE SODIUM 40 MG PO TBEC: 40.0000 mg | DELAYED_RELEASE_TABLET | Freq: Two times a day (BID) | ORAL | Status: DC

## 2018-06-23 NOTE — Progress Notes (Signed)
MD notified of patient complaining of a persistent dry cough and congestion.

## 2018-06-23 NOTE — Progress Notes (Signed)
Subjective: Hungry.  No reports of any hematochezia, melena, or hematemesis.  Objective: Vital signs in last 24 hours: Temp:  [97.8 F (36.6 C)-98.5 F (36.9 C)] 98.1 F (36.7 C) (08/31 0512) Pulse Rate:  [64-104] 68 (08/31 0512) Resp:  [12-21] 21 (08/31 0512) BP: (104-158)/(62-89) 118/71 (08/31 0512) SpO2:  [95 %-100 %] 100 % (08/31 0736) Last BM Date: 06/21/18  Intake/Output from previous day: 08/30 0701 - 08/31 0700 In: 2757 [P.O.:120; I.V.:2637] Out: 2075 [Urine:2075] Intake/Output this shift: No intake/output data recorded.  General appearance: alert and no distress GI: soft, non-tender; bowel sounds normal; no masses,  no organomegaly  Lab Results: Recent Labs    06/22/18 0243 06/22/18 0655 06/23/18 0626  WBC 4.4 3.7* 4.0  HGB 7.9* 8.1* 8.0*  HCT 23.9* 24.7* 23.9*  PLT 165 165 177   BMET Recent Labs    06/21/18 0237 06/21/18 0311 06/21/18 2141 06/22/18 0243  NA 134* 132* 140 136  K 3.5 3.5 4.1 4.6  CL 94* 93* 104 104  CO2 25  --  27 28  GLUCOSE 100* 94 130* 121*  BUN 31* 38* 15 14  CREATININE 0.96 1.00 1.01 0.87  CALCIUM 9.5  --  9.2 8.8*   LFT Recent Labs    06/22/18 0243  PROT 5.7*  ALBUMIN 3.2*  AST 29  ALT 21  ALKPHOS 50  BILITOT 0.9   PT/INR Recent Labs    06/21/18 0252 06/22/18 0243  LABPROT 13.4 14.7  INR 1.03 1.15   Hepatitis Panel No results for input(s): HEPBSAG, HCVAB, HEPAIGM, HEPBIGM in the last 72 hours. C-Diff No results for input(s): CDIFFTOX in the last 72 hours. Fecal Lactopherrin No results for input(s): FECLLACTOFRN in the last 72 hours.  Studies/Results: US Liver Doppler  Result Date: 06/22/2018 CLINICAL DATA:  Hematemesis EXAM: DUPLEX ULTRASOUND OF LIVER TECHNIQUE: Color and duplex Doppler ultrasound was performed to evaluate the hepatic in-flow and out-flow vessels. COMPARISON:  None. FINDINGS: Portal Vein Velocities Main:  37 cm/sec Right:  25 cm/sec Left:  22 cm/sec Hepatic Vein Velocities Right:  27  cm/sec Middle:  30 cm/sec Left:  62 cm/sec Hepatic Artery Velocity:  118 cm/sec Splenic Vein Velocity:  59 cm/sec Varices: Absent Ascites: Absent The liver is diffusely increased in echogenicity without focal mass. Portal vein is hepatopetal inflow. Hepatic veins are hepatofugal inflow. Splenic vein is patent with directionality towards the liver. The spleen is very small. IMPRESSION: Hepatic, portal, and splenic veins are patent with normal directionality of flow. Electronically Signed   By: Marybelle Killings M.D.   On: 06/22/2018 07:41    Medications:  Scheduled: . folic acid  1 mg Oral Daily  . loratadine  10 mg Oral Daily  . montelukast  10 mg Oral QHS  . multivitamin with minerals  1 tablet Oral Daily  . pantoprazole (PROTONIX) IV  40 mg Intravenous Q12H  . rosuvastatin  20 mg Oral Daily  . sucralfate  1 g Oral TID WC & HS  . thiamine  100 mg Oral Daily   Or  . thiamine  100 mg Intravenous Daily  . umeclidinium bromide  1 puff Inhalation Daily   Continuous: . sodium chloride 100 mL/hr at 06/23/18 0600    Assessment/Plan: 1) S/p Mallory-Weiss tear. 2) Anemia.   HGB is stable at 8.1 g/dL.  He feels well and he desires to be advanced to a regular diet.  Plan: 1) Regular diet. 2) Continue with PPI as outlined in Dr. Vivia Ewing procedure note. 3)  Follow up with Dr. Melanee Left in 2-4 weeks. 4) Signing off.  LOS: 2 days   Lucas [name] D 06/23/2018, 8:02 AM

## 2018-06-23 NOTE — Progress Notes (Signed)
Family Medicine Teaching Service Daily Progress Note Intern Pager: [phone]  Patient name: Lucas [name] Medical record number: [id number] Date of birth: [birth year] Age: 67 y.o. Gender: male  Primary Care Provider: Steve Rattler, DO Consultants: GI Code Status: Full  Pt Overview and Major Events to Date:  EGD 8/30   Assessment and Plan: Lucas [name] is a 67 y.o. male that presented with hematemesis and melena. PMH is significant for COPD, hypertension, hyperlipidemia, tobacco use, and alcohol use.   GI bleed:Resolved. No further emesis episodes, no BM since admission. EGD 8/30 showing mallory weiss tear, successfully treated w/ hemostatic clip placement and Epi injection. Hgb stable at 8.0 this am, 8.1 yesterday.  -GI following; appreciate recs as below  -F/u surgical pathology from EGD -Clear liquid diet; advance per GI recs   -No aspirin, ibuprofen, naproxen, or other NSAIDs for 5 days; d/c aspirin at discharge  -Protonix 40 mg PO BID for 8 weeks to promote mucosal healing >> then titrate to maintain control of GERD  -Zofran as needed for nausea -Cont sucralfate suspension 1g PO QID x 2 weeks  -F/u CBC  COPD:Stable  On RA, satting well without dyspnea.  -Cont home umeclidinium  -Cont home singulair  -Loratadine 10mg  daily (in place of home cetirizine)  -Albuterol PRN   Hypertension:Stable.  Normotensive overnight. Takes candesartan-HCTZ at home with good control.  -Monitor BP  -Cont to hold home candesartan-HCTZ 16-12.5mg , given normotensive; restart outpatient    Elevated Troponin: Trended flat yesterday, 0.03>0.04>0.03. Likely demand ischemia.   Alcohol RWE:[id number] drinking 3-4 16oz beers daily, with additional liquor at times.  -CIWA; 0 overnight  -Thiamine, folate, and multivitamin  Chronic Hyponatremia: Stable.  Na 136 yesterday, BMP pending.  -Monitor BMP, pending this am  Hyperlipidemia:Last lipid panel in 2017, LDL 120.  -Cont home  rosuvastatin  Tobacco Use:1/2 PPD.  -Monitor if nicotine patch is needed  FEN/GI: Clear diet currently, will advance per GI  PPX: SCDs in setting of acute bleed  Disposition: Continued inpatient care, potential d/c this afternoon following clearance from GI   Subjective:  No acute events overnight. No further emesis or BM since admission. Wanting to eat a regular diet today, states he is starving. Denies nausea, dizziness, lightheadedness, or abdominal pain. Does complain of dry "coughing fits" with occasional clear sputum, occurring intermittently for the past year. Ready to go home to have his home albuterol.   Objective: Temp:  [97.6 F (36.4 C)-98.7 F (37.1 C)] 98.4 F (36.9 C) (08/30 1946) Pulse Rate:  [72-130] 81 (08/30 1946) Resp:  [13-28] 16 (08/30 1946) BP: (104-158)/(62-89) 109/69 (08/30 1946) SpO2:  [95 %-100 %] 100 % (08/30 1946) Physical Exam: General: Alert, NAD  HEENT: NCAT, MMM, oropharynx nonerythematous  Cardiac: RRR no m/g/r Lungs: Clear bilaterally with exception of faint expiratory wheeze in R posterior lung fields, no increased WOB on RA   Abdomen: soft, non-tender, non-distended, slightly hypoactive BS Msk: Moves all extremities spontaneously  Ext: Warm, dry, 2+ distal pulses, no edema   Laboratory: Recent Labs  Lab 06/21/18 2141 06/22/18 0243 06/22/18 0655  WBC 5.4 4.4 3.7*  HGB 9.8* 7.9* 8.1*  HCT 29.6* 23.9* 24.7*  PLT 192 165 165   Recent Labs  Lab 06/21/18 0237 06/21/18 0311 06/21/18 2141 06/22/18 0243  NA 134* 132* 140 136  K 3.5 3.5 4.1 4.6  CL 94* 93* 104 104  CO2 25  --  27 28  BUN 31* 38* 15 14  CREATININE 0.96 1.00 1.01  0.87  CALCIUM 9.5  --  9.2 8.8*  PROT 6.7  --   --  5.7*  BILITOT 0.7  --   --  0.9  ALKPHOS 62  --   --  50  ALT 26  --   --  21  AST 31  --   --  29  GLUCOSE 100* 94 130* 121*     Imaging/Diagnostic Tests: No results found.  Patriciaann Clan, DO 06/23/2018, 7:45 AM PGY-1, House Intern pager: [phone], text pages welcome

## 2018-06-23 NOTE — Discharge Summary (Signed)
[facility] Discharge Summary  Patient name: Lucas [name] Medical record number: [id number] Date of birth: 1951-07-13 Age: 67 y.o. Gender: male Date of Admission: 06/21/2018  Date of Discharge: 8/31 Admitting Physician: Blane Ohara McDiarmid, MD  Primary Care Provider: Steve Rattler, DO Consultants: GI  Indication for Hospitalization: hematemesis   Discharge Diagnoses/Problem List:  Mallory-Weiss tear Anemia COPD HTN HLD Elevated troponin Alcohol use Tobacco use Chronic hyponatremia  Disposition: discharge home  Discharge Condition: stable  Discharge Exam:  (from progress note on day of discharge) General: Alert, NAD  HEENT: NCAT, MMM, oropharynx nonerythematous  Cardiac: RRR no m/g/r Lungs: Clear bilaterally with exception of faint expiratory wheeze in R posterior lung fields, no increased WOB on RA   Abdomen: soft, non-tender, non-distended, slightly hypoactive BS Msk: Moves all extremities spontaneously  Ext: Warm, dry, 2+ distal pulses, no edema   Brief Hospital Course:  Patient presented with hematemesis and melena x~10 occurences. Octreotide IV and protonix initiated in ED. No more occurences of bleeding. GI was consulted and did EGD on 8/30 showing Mallory-Weiss tear. Hgb remained stable. Patient had multiple episodes of SOB during admission with 1 troponin elevated to 0.04 and then trended down, EKG normal. Symptoms resolved with breathing treatments -considered likely due to demand ischemia.   Issues for Follow Up:  1. Patient will need close follow up for anemia, CBC 2. Follow up on biopsy taken in EGD- results pending at time of discharge 3. Follow up appointment with Dr. Melanee Left in 2-4 weeks 4. Continue PPI x8weeks, sucralfate x2weeks  5. Discontinued ASA on discharge for bleeding risk. Avoid NSAID use.  6. Patient did not take BP medication during admission with normal BP and so it was discontinued for discharge- will need to  re-evalute BP outpatient  Significant Procedures: EGD- 8/30 showed mallory-weiss tear  Significant Labs and Imaging:  Recent Labs  Lab 06/22/18 0243 06/22/18 0655 06/23/18 0626  WBC 4.4 3.7* 4.0  HGB 7.9* 8.1* 8.0*  HCT 23.9* 24.7* 23.9*  PLT 165 165 177   Recent Labs  Lab 06/21/18 0237 06/21/18 0311 06/21/18 2141 06/22/18 0243 06/23/18 0626  NA 134* 132* 140 136 138  K 3.5 3.5 4.1 4.6 4.0  CL 94* 93* 104 104 108  CO2 25  --  27 28 23   GLUCOSE 100* 94 130* 121* 98  BUN 31* 38* 15 14 5*  CREATININE 0.96 1.00 1.01 0.87 0.86  CALCIUM 9.5  --  9.2 8.8* 8.7*  ALKPHOS 62  --   --  50  --   AST 31  --   --  29  --   ALT 26  --   --  21  --   ALBUMIN 3.7  --   --  3.2*  --     Results/Tests Pending at Time of Discharge: biopsy results from EGD  Discharge Medications:  Allergies as of 06/23/2018      Reactions   Ace Inhibitors Swelling   Ampicillin Nausea And Vomiting   Penicillins Nausea And Vomiting   Codeine Nausea And Vomiting   Lisinopril Swelling   REACTION: angioedema      Medication List    STOP taking these medications   aspirin EC 81 MG tablet   candesartan-hydrochlorothiazide 16-12.5 MG tablet Commonly known as:  ATACAND HCT   ibuprofen 200 MG tablet Commonly known as:  ADVIL,MOTRIN   naproxen 375 MG tablet Commonly known as:  NAPROSYN     TAKE these medications  albuterol 108 (90 Base) MCG/ACT inhaler Commonly known as:  PROVENTIL HFA;VENTOLIN HFA INHALE 2 PUFFS INTO THE LUNGS EVERY 4 HOURS AS NEEDED FOR WHEEZING OR SHORTNESS OF BREATH   cetirizine 10 MG tablet Commonly known as:  ZYRTEC Take 1 tablet (10 mg total) by mouth daily.   montelukast 10 MG tablet Commonly known as:  SINGULAIR Take 1 tablet (10 mg total) by mouth at bedtime.   pantoprazole 40 MG tablet Commonly known as:  PROTONIX Take 1 tablet (40 mg total) by mouth 2 (two) times daily.   rosuvastatin 20 MG tablet Commonly known as:  CRESTOR Take 1 tablet (20 mg total)  by mouth daily.   sucralfate 1 GM/10ML suspension Commonly known as:  CARAFATE Take 10 mLs (1 g total) by mouth 4 (four) times daily for 13 days.   umeclidinium bromide 62.5 MCG/INH Aepb Commonly known as:  INCRUSE ELLIPTA Inhale 1 puff into the lungs daily.       Discharge Instructions: Please refer to Patient Instructions section of EMR for full details.  Patient was counseled important signs and symptoms that should prompt return to medical care, changes in medications, dietary instructions, activity restrictions, and follow up appointments.   Follow-Up Appointments: Follow-up [facility] Follow up on 06/29/2018.   Why:  @ 2:45pm. Please arrive 15 minutes prior to appointment time. Contact information: [place], [place], DO 06/23/2018, 6:31 PM PGY-1, [place]

## 2018-06-23 NOTE — Discharge Instructions (Signed)
You were admitted for hematemesis (blood in vomit). Gastroenterology was consulted who performed a upper endoscopy to look for causes for bleeding and found a mucosal tear in your esophagus that was treated with placing a clip and injection of medication to stop the bleeding. You should continue on the medications started this hospitalization to prevent future bleeding and follow up with the gastroenterologist in the office in 2-4 weeks. Follow up with your primary doctor next week.   Mallory-Weiss Syndrome Mallory-Weiss syndrome refers to bleeding from tears in the lining of the tube that connects your throat to your stomach (esophagus). The tears occur at the entrance to your stomach. Usually the bleeding stops by itself after 24-48 hours. Surgery may be needed. This condition is not usually fatal. What are the causes? The tears that cause bleeding are often caused by severe or lasting vomiting or coughing. What increases the risk?  Abusing or drinking too much alcohol.  Having certain eating disorders, such as bulimia. What are the signs or symptoms?  Vomiting of bright red or black coffee-ground-like material.  Having black, tarry stools.  Fainting or experiencing loss of consciousness. How is this diagnosed? The procedure often used to diagnose Mallory-Weiss syndrome is an esophagogastroduodenoscopy (EGD). During an EGD procedure, a small, flexible, tube-like telescope (endoscope) is put into your mouth, passed through your esophagus into your stomach, and then into your small bowel. An EGD will show your health care provider where the tear is. How is this treated? It is necessary to stop the bleeding as soon as possible. The possible treatments to stop the bleeding include injecting medicine into bleeding areas to block (clot) the blood vessels. If bleeding is significant, it may be necessary to replace the blood lost with a blood transfusion. Follow these instructions at home: Treat  any conditions that may be causing the lasting or recurrent vomiting or coughing. This may include getting help for alcoholism or an eating disorder, if this applies. Contact a health care provider if: You have nausea or vomiting. Get help right away if:  You have persistent dizziness, light-headedness, or fainting.  Your vomiting returns, or you have vomit that is bright red or looks like black coffee grounds.  You have bloody or black, tarry-looking stools.  You have chest pain.  You cannot eat or drink. This information is not intended to replace advice given to you by your health care provider. Make sure you discuss any questions you have with your health care provider. Document Released: 02/27/2006 Document Revised: 03/17/2016 Document Reviewed: 12/04/2013 Elsevier Interactive Patient Education  [employer].

## 2018-06-23 NOTE — Progress Notes (Signed)
[name] to be D/C'd Home per MD order.  Discussed with the patient and all questions fully answered.  VSS, Skin clean, dry and intact without evidence of skin break down, no evidence of skin tears noted. IV catheter discontinued intact. Site without signs and symptoms of complications. Dressing and pressure applied.  An After Visit Summary was printed and given to the patient. Patient received prescription.  D/c education completed with patient/family including follow up instructions, medication list, d/c activities limitations if indicated, with other d/c instructions as indicated by MD - patient able to verbalize understanding, all questions fully answered.   Patient instructed to return to ED, call 911, or call MD for any changes in condition.   Patient escorted via [place], and D/C home via private auto.  Holley Raring 06/23/2018 5:49 PM

## 2018-06-23 NOTE — Progress Notes (Signed)
Pt with mild panic attack with c/o of difficulty breathing and PRN Albuterol given with relief

## 2018-06-24 ENCOUNTER — Inpatient Hospital Stay (HOSPITAL_COMMUNITY)
Admission: EM | Admit: 2018-06-24 | Discharge: 2018-06-28 | DRG: 287 | Disposition: A | Discharge status: Home / Self Care | Payer: Medicare HMO | Attending: Family Medicine | Admitting: Family Medicine

## 2018-06-24 ENCOUNTER — Emergency Department (HOSPITAL_COMMUNITY): Payer: Medicare HMO

## 2018-06-24 ENCOUNTER — Encounter (HOSPITAL_COMMUNITY): Payer: Self-pay

## 2018-06-24 ENCOUNTER — Other Ambulatory Visit: Payer: Self-pay

## 2018-06-24 DIAGNOSIS — J449 Chronic obstructive pulmonary disease, unspecified: Secondary | ICD-10-CM | POA: Diagnosis present

## 2018-06-24 DIAGNOSIS — D62 Acute posthemorrhagic anemia: Secondary | ICD-10-CM | POA: Diagnosis present

## 2018-06-24 DIAGNOSIS — I249 Acute ischemic heart disease, unspecified: Secondary | ICD-10-CM

## 2018-06-24 DIAGNOSIS — R0602 Shortness of breath: Secondary | ICD-10-CM

## 2018-06-24 DIAGNOSIS — Z885 Allergy status to narcotic agent status: Secondary | ICD-10-CM

## 2018-06-24 DIAGNOSIS — I10 Essential (primary) hypertension: Secondary | ICD-10-CM | POA: Diagnosis present

## 2018-06-24 DIAGNOSIS — I700 Atherosclerosis of aorta: Secondary | ICD-10-CM | POA: Diagnosis present

## 2018-06-24 DIAGNOSIS — R079 Chest pain, unspecified: Secondary | ICD-10-CM | POA: Diagnosis present

## 2018-06-24 DIAGNOSIS — E7800 Pure hypercholesterolemia, unspecified: Secondary | ICD-10-CM | POA: Diagnosis not present

## 2018-06-24 DIAGNOSIS — K219 Gastro-esophageal reflux disease without esophagitis: Secondary | ICD-10-CM | POA: Diagnosis present

## 2018-06-24 DIAGNOSIS — Z888 Allergy status to other drugs, medicaments and biological substances status: Secondary | ICD-10-CM

## 2018-06-24 DIAGNOSIS — M818 Other osteoporosis without current pathological fracture: Secondary | ICD-10-CM | POA: Diagnosis present

## 2018-06-24 DIAGNOSIS — Z8719 Personal history of other diseases of the digestive system: Secondary | ICD-10-CM

## 2018-06-24 DIAGNOSIS — R778 Other specified abnormalities of plasma proteins: Secondary | ICD-10-CM | POA: Diagnosis not present

## 2018-06-24 DIAGNOSIS — R053 Chronic cough: Secondary | ICD-10-CM | POA: Diagnosis present

## 2018-06-24 DIAGNOSIS — Z881 Allergy status to other antibiotic agents status: Secondary | ICD-10-CM

## 2018-06-24 DIAGNOSIS — Z880 Allergy status to penicillin: Secondary | ICD-10-CM

## 2018-06-24 DIAGNOSIS — M87059 Idiopathic aseptic necrosis of unspecified femur: Secondary | ICD-10-CM | POA: Diagnosis present

## 2018-06-24 DIAGNOSIS — I25110 Atherosclerotic heart disease of native coronary artery with unstable angina pectoris: Principal | ICD-10-CM | POA: Diagnosis present

## 2018-06-24 DIAGNOSIS — J302 Other seasonal allergic rhinitis: Secondary | ICD-10-CM | POA: Diagnosis present

## 2018-06-24 DIAGNOSIS — F17200 Nicotine dependence, unspecified, uncomplicated: Secondary | ICD-10-CM

## 2018-06-24 DIAGNOSIS — R748 Abnormal levels of other serum enzymes: Secondary | ICD-10-CM | POA: Diagnosis not present

## 2018-06-24 DIAGNOSIS — B9681 Helicobacter pylori [H. pylori] as the cause of diseases classified elsewhere: Secondary | ICD-10-CM | POA: Diagnosis present

## 2018-06-24 DIAGNOSIS — F17210 Nicotine dependence, cigarettes, uncomplicated: Secondary | ICD-10-CM | POA: Diagnosis present

## 2018-06-24 DIAGNOSIS — R072 Precordial pain: Secondary | ICD-10-CM

## 2018-06-24 DIAGNOSIS — R7989 Other specified abnormal findings of blood chemistry: Secondary | ICD-10-CM

## 2018-06-24 DIAGNOSIS — I200 Unstable angina: Secondary | ICD-10-CM

## 2018-06-24 DIAGNOSIS — R05 Cough: Secondary | ICD-10-CM | POA: Diagnosis present

## 2018-06-24 DIAGNOSIS — R931 Abnormal findings on diagnostic imaging of heart and coronary circulation: Secondary | ICD-10-CM

## 2018-06-24 DIAGNOSIS — S82201A Unspecified fracture of shaft of right tibia, initial encounter for closed fracture: Secondary | ICD-10-CM

## 2018-06-24 DIAGNOSIS — K269 Duodenal ulcer, unspecified as acute or chronic, without hemorrhage or perforation: Secondary | ICD-10-CM

## 2018-06-24 DIAGNOSIS — E871 Hypo-osmolality and hyponatremia: Secondary | ICD-10-CM | POA: Diagnosis present

## 2018-06-24 DIAGNOSIS — F1020 Alcohol dependence, uncomplicated: Secondary | ICD-10-CM | POA: Diagnosis present

## 2018-06-24 DIAGNOSIS — K7460 Unspecified cirrhosis of liver: Secondary | ICD-10-CM | POA: Diagnosis present

## 2018-06-24 DIAGNOSIS — Z720 Tobacco use: Secondary | ICD-10-CM | POA: Diagnosis present

## 2018-06-24 DIAGNOSIS — K2970 Gastritis, unspecified, without bleeding: Secondary | ICD-10-CM | POA: Diagnosis present

## 2018-06-24 DIAGNOSIS — G629 Polyneuropathy, unspecified: Secondary | ICD-10-CM | POA: Diagnosis present

## 2018-06-24 DIAGNOSIS — K2990 Gastroduodenitis, unspecified, without bleeding: Secondary | ICD-10-CM

## 2018-06-24 DIAGNOSIS — I272 Pulmonary hypertension, unspecified: Secondary | ICD-10-CM | POA: Diagnosis present

## 2018-06-24 DIAGNOSIS — S42009A Fracture of unspecified part of unspecified clavicle, initial encounter for closed fracture: Secondary | ICD-10-CM | POA: Diagnosis present

## 2018-06-24 DIAGNOSIS — J301 Allergic rhinitis due to pollen: Secondary | ICD-10-CM | POA: Diagnosis present

## 2018-06-24 DIAGNOSIS — F419 Anxiety disorder, unspecified: Secondary | ICD-10-CM | POA: Diagnosis present

## 2018-06-24 DIAGNOSIS — E785 Hyperlipidemia, unspecified: Secondary | ICD-10-CM | POA: Diagnosis present

## 2018-06-24 HISTORY — DX: Gastro-esophageal laceration-hemorrhage syndrome: K22.6

## 2018-06-24 HISTORY — DX: Fracture of unspecified part of unspecified clavicle, initial encounter for closed fracture: S42.009A

## 2018-06-24 HISTORY — DX: Personal history of (healed) traumatic fracture: Z87.81

## 2018-06-24 HISTORY — DX: Idiopathic aseptic necrosis of unspecified femur: M87.059

## 2018-06-24 HISTORY — DX: Multiple fractures of ribs, unspecified side, initial encounter for closed fracture: S22.49XA

## 2018-06-24 HISTORY — DX: Gastrointestinal hemorrhage, unspecified: K92.2

## 2018-06-24 HISTORY — DX: Unspecified fracture of shaft of right tibia, initial encounter for closed fracture: S82.201A

## 2018-06-24 HISTORY — DX: Acute posthemorrhagic anemia: D62

## 2018-06-24 LAB — COMPREHENSIVE METABOLIC PANEL
ALT: 23 U/L (ref 0–44)
AST: 33 U/L (ref 15–41)
Albumin: 3.3 g/dL — ABNORMAL LOW (ref 3.5–5.0)
Alkaline Phosphatase: 67 U/L (ref 38–126)
Anion gap: 7 (ref 5–15)
BUN: 5 mg/dL — ABNORMAL LOW (ref 8–23)
CO2: 26 mmol/L (ref 22–32)
Calcium: 8.9 mg/dL (ref 8.9–10.3)
Chloride: 111 mmol/L (ref 98–111)
Creatinine, Ser: 0.98 mg/dL (ref 0.61–1.24)
Glucose, Bld: 108 mg/dL — ABNORMAL HIGH (ref 70–99)
POTASSIUM: 3.6 mmol/L (ref 3.5–5.1)
Sodium: 144 mmol/L (ref 135–145)
TOTAL PROTEIN: 5.9 g/dL — AB (ref 6.5–8.1)
Total Bilirubin: 0.7 mg/dL (ref 0.3–1.2)

## 2018-06-24 LAB — CBC WITH DIFFERENTIAL/PLATELET
ABS IMMATURE GRANULOCYTES: 0 10*3/uL (ref 0.0–0.1)
BASOS ABS: 0 10*3/uL (ref 0.0–0.1)
BASOS PCT: 0 %
EOS PCT: 2 %
Eosinophils Absolute: 0.1 10*3/uL (ref 0.0–0.7)
HCT: 25.2 % — ABNORMAL LOW (ref 39.0–52.0)
HEMOGLOBIN: 8.3 g/dL — AB (ref 13.0–17.0)
Immature Granulocytes: 0 %
Lymphocytes Relative: 30 %
Lymphs Abs: 1.4 10*3/uL (ref 0.7–4.0)
MCH: 33.1 pg (ref 26.0–34.0)
MCHC: 32.9 g/dL (ref 30.0–36.0)
MCV: 100.4 fL — ABNORMAL HIGH (ref 78.0–100.0)
Monocytes Absolute: 0.6 10*3/uL (ref 0.1–1.0)
Monocytes Relative: 12 %
NEUTROS ABS: 2.5 10*3/uL (ref 1.7–7.7)
Neutrophils Relative %: 56 %
PLATELETS: 195 10*3/uL (ref 150–400)
RBC: 2.51 MIL/uL — AB (ref 4.22–5.81)
RDW: 12.8 % (ref 11.5–15.5)
WBC: 4.6 10*3/uL (ref 4.0–10.5)

## 2018-06-24 LAB — ETHANOL: Alcohol, Ethyl (B): <10 mg/dL (ref <10)

## 2018-06-24 LAB — TROPONIN I
TROPONIN I: 0.07 ng/mL — AB (ref <0.03)
Troponin I: 0.08 ng/mL (ref <0.03)

## 2018-06-24 LAB — I-STAT CHEM 8, ED
BUN: 4 mg/dL — ABNORMAL LOW (ref 8–23)
CREATININE: 1 mg/dL (ref 0.61–1.24)
Calcium, Ion: 1.25 mmol/L (ref 1.15–1.40)
Chloride: 108 mmol/L (ref 98–111)
GLUCOSE: 104 mg/dL — AB (ref 70–99)
HCT: 24 % — ABNORMAL LOW (ref 39.0–52.0)
HEMOGLOBIN: 8.2 g/dL — AB (ref 13.0–17.0)
Potassium: 3.6 mmol/L (ref 3.5–5.1)
Sodium: 143 mmol/L (ref 135–145)
TCO2: 23 mmol/L (ref 22–32)

## 2018-06-24 LAB — PROTIME-INR
INR: 1
PROTHROMBIN TIME: 13.1 s (ref 11.4–15.2)

## 2018-06-24 LAB — I-STAT TROPONIN, ED: TROPONIN I, POC: 0.01 ng/mL (ref 0.00–0.08)

## 2018-06-24 LAB — LIPASE, BLOOD: LIPASE: 40 U/L (ref 11–51)

## 2018-06-24 MED ORDER — SODIUM CHLORIDE 0.9 % IV BOLUS
500.0000 mL | Freq: Once | INTRAVENOUS | Status: AC
  Administered 2018-06-24: 500 mL via INTRAVENOUS

## 2018-06-24 MED ORDER — UMECLIDINIUM BROMIDE 62.5 MCG/INH IN AEPB: 1.0000 | INHALATION_SPRAY | Freq: Every day | RESPIRATORY_TRACT | Status: DC

## 2018-06-24 MED ORDER — ADULT MULTIVITAMIN W/MINERALS CH: 1.0000 | ORAL_TABLET | Freq: Every day | ORAL | Status: DC

## 2018-06-24 MED ORDER — ENOXAPARIN SODIUM 40 MG/0.4ML ~~LOC~~ SOLN: 40.0000 mg | SUBCUTANEOUS | Status: DC

## 2018-06-24 MED ORDER — ASPIRIN EC 81 MG PO TBEC
81.0000 mg | DELAYED_RELEASE_TABLET | Freq: Every day | ORAL | Status: DC
  Administered 2018-06-24: 81 mg via ORAL
  Filled 2018-06-24: qty 1

## 2018-06-24 MED ORDER — MONTELUKAST SODIUM 10 MG PO TABS
10.0000 mg | ORAL_TABLET | Freq: Every day | ORAL | Status: DC
  Administered 2018-06-24 – 2018-06-27 (×4): 10 mg via ORAL
  Filled 2018-06-24 (×4): qty 1

## 2018-06-24 MED ORDER — SUCRALFATE 1 GM/10ML PO SUSP
1.0000 g | Freq: Four times a day (QID) | ORAL | Status: DC
  Administered 2018-06-24 – 2018-06-26 (×9): 1 g via ORAL
  Filled 2018-06-24 (×8): qty 10

## 2018-06-24 MED ORDER — ROSUVASTATIN CALCIUM 20 MG PO TABS
20.0000 mg | ORAL_TABLET | Freq: Every day | ORAL | Status: DC
  Administered 2018-06-24 – 2018-06-28 (×5): 20 mg via ORAL
  Filled 2018-06-24 (×5): qty 1

## 2018-06-24 MED ORDER — LORAZEPAM 1 MG PO TABS
0.0000 mg | ORAL_TABLET | Freq: Two times a day (BID) | ORAL | Status: AC
  Filled 2018-06-24: qty 1

## 2018-06-24 MED ORDER — ALBUTEROL SULFATE (2.5 MG/3ML) 0.083% IN NEBU
2.5000 mg | INHALATION_SOLUTION | RESPIRATORY_TRACT | Status: DC | PRN
  Administered 2018-06-24 – 2018-06-25 (×2): 2.5 mg via RESPIRATORY_TRACT
  Filled 2018-06-24 (×2): qty 3

## 2018-06-24 MED ORDER — VITAMIN B-1 100 MG PO TABS
100.0000 mg | ORAL_TABLET | Freq: Every day | ORAL | Status: DC
  Administered 2018-06-24 – 2018-06-28 (×5): 100 mg via ORAL
  Filled 2018-06-24 (×5): qty 1

## 2018-06-24 MED ORDER — ADULT MULTIVITAMIN W/MINERALS CH
1.0000 | ORAL_TABLET | Freq: Every day | ORAL | Status: DC
  Administered 2018-06-24 – 2018-06-28 (×5): 1 via ORAL
  Filled 2018-06-24 (×5): qty 1

## 2018-06-24 MED ORDER — LORAZEPAM 1 MG PO TABS: 1.0000 mg | ORAL_TABLET | Freq: Four times a day (QID) | ORAL | Status: AC | PRN

## 2018-06-24 MED ORDER — LORAZEPAM 1 MG PO TABS: 0.0000 mg | ORAL_TABLET | Freq: Four times a day (QID) | ORAL | Status: AC

## 2018-06-24 MED ORDER — LORAZEPAM 2 MG/ML IJ SOLN: 1.0000 mg | Freq: Four times a day (QID) | INTRAMUSCULAR | Status: AC | PRN

## 2018-06-24 MED ORDER — PANTOPRAZOLE SODIUM 40 MG PO TBEC
40.0000 mg | DELAYED_RELEASE_TABLET | Freq: Two times a day (BID) | ORAL | Status: DC
  Administered 2018-06-24 – 2018-06-28 (×9): 40 mg via ORAL
  Filled 2018-06-24 (×9): qty 1

## 2018-06-24 MED ORDER — NITROGLYCERIN 0.4 MG SL SUBL
SUBLINGUAL_TABLET | SUBLINGUAL | Status: AC
  Administered 2018-06-24: 0.4 mg
  Administered 2018-06-24: 18:00:00
  Filled 2018-06-24: qty 1

## 2018-06-24 MED ORDER — VITAMIN B-1 100 MG PO TABS: 100.0000 mg | ORAL_TABLET | Freq: Every day | ORAL | Status: DC

## 2018-06-24 MED ORDER — LORATADINE 10 MG PO TABS
10.0000 mg | ORAL_TABLET | Freq: Every day | ORAL | Status: DC
  Administered 2018-06-25 – 2018-06-28 (×4): 10 mg via ORAL
  Filled 2018-06-24 (×4): qty 1

## 2018-06-24 MED ORDER — LORAZEPAM 2 MG/ML IJ SOLN
1.0000 mg | Freq: Once | INTRAMUSCULAR | Status: AC
  Administered 2018-06-24: 1 mg via INTRAVENOUS
  Filled 2018-06-24: qty 1

## 2018-06-24 MED ORDER — SODIUM CHLORIDE 0.9 % IV SOLN
INTRAVENOUS | Status: AC
  Administered 2018-06-24: 12:00:00 via INTRAVENOUS

## 2018-06-24 MED ORDER — THIAMINE HCL 100 MG/ML IJ SOLN: 100.0000 mg | Freq: Every day | INTRAMUSCULAR | Status: DC

## 2018-06-24 MED ORDER — FOLIC ACID 1 MG PO TABS
1.0000 mg | ORAL_TABLET | Freq: Every day | ORAL | Status: DC
  Administered 2018-06-24 – 2018-06-28 (×5): 1 mg via ORAL
  Filled 2018-06-24 (×5): qty 1

## 2018-06-24 NOTE — Progress Notes (Signed)
FPTS Interim Progress Note:   Notified by nursing of acute onset substernal chest pain episode with radiation down his L arm and into his back, associated with increased SOB and diaphoresis around 1800. Hypertensive to 347'Q systolic and tachycardic during this time. Gave Nitro x2 and breathing treatment with resolution in his symptoms. Last approximately 10 minutes. EKG obtained. Evaluated pt at bedside, denies any remaining chest pain or SOB.   O: Blood pressure (!) 147/76, pulse 96, temperature 98.1 F (36.7 C), temperature source Oral, resp. rate 18, height 5\' 11"  (1.803 m), weight 68.7 kg, SpO2 100 %. While in room, BP 259 systolic, pulse 56'L.  Gen: Alert, NAD, lying comfortably in bed, non-toxic appearing   Cardiac: RRR, no m/g/r  Lungs: Clear anteriorly, some minor expiratory wheeze in posterior lung fields with poor air movement, no increased WOB on 2L [place] satting 100%  Neuro: A&Ox3, following commands, answering questions appropriately  Ext: warm, dry, well perfused  A/P: Acute chest pain and dyspnea: Pt now stable and asymptomatic, however concern for ACS. EKG with new potential ST depressions and T wave changes in inferior and lateral leads. Troponin returned at 0.08, previously negative.  -Consult cardiology; appreciate recs -Trend troponin  -Nitro PRN  -Will continue to monitor closely  Patriciaann Clan, DO  PGY-1 Family Medicine

## 2018-06-24 NOTE — Plan of Care (Signed)
  Problem: Pain Managment: Goal: General experience of comfort will improve Outcome: Progressing   

## 2018-06-24 NOTE — ED Triage Notes (Signed)
To ED via [facility] from home, (discharged from hospital yesterday) c/o chest pain, sudden onset-- lasted 10 minutes, received NTG x 2 with relief -- also received ASA 324mg . Pt tremulous on arrival- states he is cold. Was in hospital for GI Bleed,  Hx ETOH use daily-- drank "a little vodka last night"  Denies having any withdrawal symptoms in past .

## 2018-06-24 NOTE — Progress Notes (Signed)
CRITICAL VALUE ALERT  Critical Value:  Trop 0.08  Date & Time Notied:  9/1 17:58  Provider Notified: md paged and awaiting return call  Orders Received/Actions taken:

## 2018-06-24 NOTE — ED Provider Notes (Signed)
[facility] PROGRESSIVE CARE Provider Note   CSN: [id number] Arrival date & time: 06/24/18  1025     History   Chief Complaint Chief Complaint  Patient presents with  . Chest Pain    HPI Lucas [name] is a 67 y.o. male.  Patient brought in by EMS from home for shortness of breath and chest pain.  Patient just discharged yesterday from the family practice service after hospitalization for melanotic stools.  Patient has a history of alcohol use.  States that he did have vodka last night.  No beer today but normally drinks 4 beers a day.  At 830 this morning he had chest pain on both sides of his chest associated with shortness of breath.  EMS gave him aspirin and nitroglycerin x2 with complete resolution of the chest pain.  EMS had rhythm strips that showed flipped T waves laterally.  And some increase in ST segment depression laterally.  Patient arrives chest pain-free but still was feeling short of breath had some faint wheezing.  Patient denies any further blood in his bowel movements are black bowel movements since discharge.     Past Medical History:  Diagnosis Date  . Allergy   . COPD 12/21/2006  . Environmental allergies   . HYPERLIPIDEMIA 12/21/2006  . HYPERTENSION, BENIGN SYSTEMIC 12/21/2006  . TOBACCO DEPENDENCE 12/21/2006    Patient Active Problem List   Diagnosis Date Noted  . Chest pain 06/24/2018  . Mallory-Weiss tear   . Gastritis and gastroduodenitis   . Cirrhosis of liver ([place])   . Hematemesis 06/21/2018  . Acute upper GI bleed 06/21/2018  . Acute blood loss anemia 06/21/2018  . Tubular adenoma of colon 01/24/2017  . Healthcare maintenance 10/13/2016  . HTN (hypertension) 02/13/2013  . Ganglion cyst 01/03/2012  . PERIPHERAL NEUROPATHY 07/15/2010  . Alcohol use disorder, severe, dependence ([place]) 05/14/2009  . DEFORMITY, FOOT NEC, CONGENITAL 08/22/2007  . ALLERGIC RHINITIS, SEASONAL 05/03/2007  . Hyperlipidemia 12/21/2006  . TOBACCO DEPENDENCE 12/21/2006    . COPD, mild ([place]) 12/21/2006    Past Surgical History:  Procedure Laterality Date  . ANKLE SURGERY     left ankle  . APPLICATION OF WOUND VAC  01/06/2012   Procedure: APPLICATION OF WOUND VAC;  Surgeon: Meredith Pel, MD;  Location: [facility];  Service: Orthopedics;  Laterality: Left;  . APPLICATION OF WOUND VAC Right 10/17/2015   Procedure: APPLICATION OF WOUND VAC;  Surgeon: Leandrew Koyanagi, MD;  Location: [place] [facility];  Service: Orthopedics;  Laterality: Right;  . EXTERNAL FIXATION LEG  01/06/2012   Procedure: EXTERNAL FIXATION LEG;  Surgeon: Meredith Pel, MD;  Location: [facility];  Service: Orthopedics;  Laterality: Left;  Smith-nephew external fixator set  . EXTERNAL FIXATION LEG Right 09/16/2015   Procedure: EXTERNAL FIXATION LEG/LARGE;  Surgeon: Mcarthur Rossetti, MD;  Location: [place];  Service: Orthopedics;  Laterality: Right;  . EXTERNAL FIXATION REMOVAL Right 09/22/2015   Procedure: REMOVAL EXTERNAL FIXATION LEG;  Surgeon: Mcarthur Rossetti, MD;  Location: [facility];  Service: Orthopedics;  Laterality: Right;  . FASCIOTOMY  01/10/2012   Procedure: FASCIOTOMY;  Surgeon: Rozanna Box, MD;  Location: [facility];  Service: Orthopedics;  Laterality: Left;  status post fasciotomy with wound vac left calf; adjustment external fixator left tibia under fluoro; retention suture/wound vac placement left lateral calf wound  . FASCIOTOMY  01/06/2012   Procedure: FASCIOTOMY;  Surgeon: Meredith Pel, MD;  Location: [facility];  Service: Orthopedics;  Laterality: Left;  Four compartment fasciotomy  . I&D EXTREMITY  01/12/2012   Procedure: IRRIGATION AND DEBRIDEMENT EXTREMITY;  Surgeon: Rozanna Box, MD;  Location: [facility];  Service: Orthopedics;  Laterality: Left;  LAYERD CLOSURE LEFT LEG WOUND  . I&D EXTREMITY Right 10/17/2015   Procedure: IRRIGATION AND DEBRIDEMENT EXTREMITY;  Surgeon: Leandrew Koyanagi, MD;  Location: [facility];  Service: Orthopedics;  Laterality: Right;  . I&D EXTREMITY Right 10/20/2015    Procedure: REPEAT IRRIGATION AND DEBRIDEMENT EXTREMITY, right lower leg;  Surgeon: Mcarthur Rossetti, MD;  Location: [facility];  Service: Orthopedics;  Laterality: Right;  . KNEE SURGERY     left knee and right knee  . MASS EXCISION Left 09/22/2015   Procedure: EXCISION MASS;  Surgeon: Mcarthur Rossetti, MD;  Location: [facility];  Service: Orthopedics;  Laterality: Left;  . ORIF TIBIA PLATEAU  02/07/2012   Procedure: OPEN REDUCTION INTERNAL FIXATION (ORIF) TIBIAL PLATEAU;  Surgeon: Rozanna Box, MD;  Location: [facility];  Service: Orthopedics;  Laterality: Left;  ORIF LEFT TIBIAL PLATEAU/ REMOVE EXTERNAL FIXATION LEFT LEG  . ORIF TIBIA PLATEAU Right 09/22/2015   Procedure: REMOVAL OF EXTERNAL FIXATOR RIGHT LEG, OPEN REDUCTION INTERNAL FIXATION (ORIF) RIGHT TIBIAL PLATEAU, EXCISION SMALL MASS LEFT LEG;  Surgeon: Mcarthur Rossetti, MD;  Location: [facility];  Service: Orthopedics;  Laterality: Right;  . SKIN SPLIT GRAFT Right 10/23/2015   Procedure: RIGHT GASTROC FLAP SPLIT THICKNESS SKIN GRAFT FROM RIGHT THING TO RIGHT LEG;  Surgeon: Irene Limbo, MD;  Location: [facility];  Service: Plastics;  Laterality: Right;  . WRIST SURGERY     left        Home Medications    Prior to Admission medications   Medication Sig Start Date End Date Taking? Authorizing Provider  albuterol (PROVENTIL HFA;VENTOLIN HFA) 108 (90 Base) MCG/ACT inhaler INHALE 2 PUFFS INTO THE LUNGS EVERY 4 HOURS AS NEEDED FOR WHEEZING OR SHORTNESS OF BREATH 02/07/18  Yes Riccio, Angela C, DO  cetirizine (ZYRTEC) 10 MG tablet Take 1 tablet (10 mg total) by mouth daily. 02/07/18  Yes Riccio, Angela C, DO  montelukast (SINGULAIR) 10 MG tablet Take 1 tablet (10 mg total) by mouth at bedtime. 05/23/18  Yes Riccio, Angela C, DO  rosuvastatin (CRESTOR) 20 MG tablet Take 1 tablet (20 mg total) by mouth daily. 10/27/17  Yes Riccio, Angela C, DO  pantoprazole (PROTONIX) 40 MG tablet Take 1 tablet (40 mg total) by mouth 2 (two) times daily.  06/23/18 08/16/18  Rory Percy, DO  sucralfate (CARAFATE) 1 GM/10ML suspension Take 10 mLs (1 g total) by mouth 4 (four) times daily for 13 days. 06/23/18 07/06/18  Rory Percy, DO  umeclidinium bromide (INCRUSE ELLIPTA) 62.5 MCG/INH AEPB Inhale 1 puff into the lungs daily. 05/23/18   Steve Rattler, DO  loratadine (CLARITIN) 10 MG tablet Take 1 tablet (10 mg total) by mouth daily. 03/04/11 05/23/18  Mariana Arn, MD    Family History Family History  Problem Relation Age of Onset  . Emphysema Father     Social History Social History   Tobacco Use  . Smoking status: Current Every Day Smoker    Packs/day: 0.50    Years: 51.00    Pack years: 25.50    Types: Cigarettes  . Smokeless tobacco: Former User    Types: Snuff, Sarina Ser    Quit date: 01/05/1965  Substance Use Topics  . Alcohol use: Yes    Alcohol/week: 3.0 - 4.0 standard drinks    Types: 3 - 4  Cans of beer per week    Comment: 3-4 cans beer daily  . Drug use: No     Allergies   Ace inhibitors; Ampicillin; Penicillins; Codeine; and Lisinopril   Review of Systems Review of Systems  Constitutional: Positive for fatigue.  HENT: Negative for congestion.   Eyes: Negative for redness.  Respiratory: Positive for shortness of breath.   Cardiovascular: Positive for chest pain.  Gastrointestinal: Negative for abdominal pain, blood in stool, diarrhea and vomiting.  Genitourinary: Negative for dysuria.  Musculoskeletal: Negative for back pain.  Skin: Negative for rash.  Neurological: Negative for headaches.  Hematological: Does not bruise/bleed easily.  Psychiatric/Behavioral: Negative for confusion.     Physical Exam Updated Vital Signs BP (!) 147/76 (BP Location: Right Arm)   Pulse 96   Temp 98.1 F (36.7 C) (Oral)   Resp 18   Ht 1.803 m (5\' 11" )   Wt 68.7 kg   SpO2 100%   BMI 21.12 kg/m   Physical Exam  Constitutional: He is oriented to person, place, and time. He appears well-developed and well-nourished.  He appears distressed.  HENT:  Head: Normocephalic and atraumatic.  Mouth/Throat: Oropharynx is clear and moist.  Eyes: Pupils are equal, round, and reactive to light. EOM are normal.  Neck: Neck supple.  Cardiovascular: Normal rate and regular rhythm.  Pulmonary/Chest: He has wheezes.  Abdominal: Soft. Bowel sounds are normal. There is no tenderness.  Musculoskeletal: Normal range of motion. He exhibits no edema.  Neurological: He is alert and oriented to person, place, and time. No cranial nerve deficit or sensory deficit. He exhibits normal muscle tone. Coordination normal.  Skin: Skin is warm.  Nursing note and vitals reviewed.    ED Treatments / Results  Labs (all labs ordered are listed, but only abnormal results are displayed) Labs Reviewed  COMPREHENSIVE METABOLIC PANEL - Abnormal; Notable for the following components:      Result Value   Glucose, Bld 108 (*)    BUN 5 (*)    Total Protein 5.9 (*)    Albumin 3.3 (*)    All other components within normal limits  CBC WITH DIFFERENTIAL/PLATELET - Abnormal; Notable for the following components:   RBC 2.51 (*)    Hemoglobin 8.3 (*)    HCT 25.2 (*)    MCV 100.4 (*)    All other components within normal limits  I-STAT CHEM 8, ED - Abnormal; Notable for the following components:   BUN 4 (*)    Glucose, Bld 104 (*)    Hemoglobin 8.2 (*)    HCT 24.0 (*)    All other components within normal limits  LIPASE, BLOOD  PROTIME-INR  ETHANOL  TROPONIN I  TROPONIN I  TROPONIN I  I-STAT TROPONIN, ED    EKG EKG Interpretation  Date/Time:  Sunday June 24 2018 10:31:49 EDT Ventricular Rate:  101 PR Interval:    QRS Duration: 95 QT Interval:  369 QTC Calculation: 479 R Axis:   85 Text Interpretation:  Sinus tachycardia Borderline right axis deviation Probable left ventricular hypertrophy Nonspecific T abnormalities, lateral leads Borderline prolonged QT interval No significant change since 06/22/18 Reconfirmed by  Fredia Sorrow [id number]) on 06/24/2018 10:44:46 AM   Radiology Dg Chest Port 1 View  Result Date: 06/24/2018 CLINICAL DATA:  67 year old male with sudden onset of transient chest pain. Recently discharged from the hospital yesterday. EXAM: PORTABLE CHEST 1 VIEW COMPARISON:  Prior chest x-ray 06/21/2017 FINDINGS: Stable borderline cardiomegaly. The mediastinal contours are unchanged. Atherosclerotic  calcifications again noted in the transverse aorta. No evidence of pneumothorax, new pulmonary edema, airspace opacity or pleural effusion. Stable trace right pleural effusion versus pleural thickening in the periphery of the lung. Chronic bronchitic changes and interstitial prominence are similar compared to prior. Remote healed right-sided rib fractures again noted. 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 IMPRESSION: Stable chest x-ray without evidence of acute cardiopulmonary process. Electronically Signed   By: Jacqulynn Cadet M.D.   On: 06/24/2018 11:04    Procedures Procedures (including critical care time)  Medications Ordered in ED Medications  0.9 %  sodium chloride infusion ( Intravenous Transfusing/Transfer 06/24/18 1458)  rosuvastatin (CRESTOR) tablet 20 mg (20 mg Oral Given 06/24/18 1619)  pantoprazole (PROTONIX) EC tablet 40 mg (40 mg Oral Given 06/24/18 1620)  montelukast (SINGULAIR) tablet 10 mg (has no administration in time range)  loratadine (CLARITIN) tablet 10 mg (has no administration in time range)  sucralfate (CARAFATE) 1 GM/10ML suspension 1 g (has no administration in time range)  thiamine (VITAMIN B-1) tablet 100 mg (100 mg Oral Given 06/24/18 1619)  multivitamin with minerals tablet 1 tablet (1 tablet Oral Given 06/24/18 1619)  LORazepam (ATIVAN) tablet 1 mg (has no administration in time range)    Or  LORazepam (ATIVAN) injection 1 mg (has no administration in time range)  folic acid (FOLVITE) tablet 1 mg (1 mg Oral Given 06/24/18 1619)  aspirin EC  tablet 81 mg (81 mg Oral Given 06/24/18 1619)  LORazepam (ATIVAN) tablet 0-4 mg (0 mg Oral Not Given 06/24/18 1620)    Followed by  LORazepam (ATIVAN) tablet 0-4 mg (has no administration in time range)  albuterol (PROVENTIL) (2.5 MG/3ML) 0.083% nebulizer solution 2.5 mg (has no administration in time range)  sodium chloride 0.9 % bolus 500 mL (0 mLs Intravenous Stopped 06/24/18 1142)  LORazepam (ATIVAN) injection 1 mg (1 mg Intravenous Given 06/24/18 1107)     Initial Impression / Assessment and Plan / ED Course  I have reviewed the triage vital signs and the nursing notes.  Pertinent labs & imaging results that were available during my care of the patient were reviewed by me and considered in my medical decision making (see chart for details).     Patient remained chest pain-free however due to the flipped T waves suggestive of ischemic changes before his chest pain went away feel the patient warrants admission for rule out.  Initial troponin negative.  Breathing settled down here and patient did fine.  Patient had some tremors when he arrived he was given some Ativan 1 mg this helped significantly.  Family medicine contacted for the readmission.  Patient's hemoglobin baseline.  Patient wheezing went on to resolve.  EKG showed sinus tachycardia with heart rate of 101.  As mentioned that we had flipped T waves initially upon repeat EKG upon arrival patient's EKG was back to what in comparison to his previous EKGs during the last admission.  Family medicine will admit.  Final Clinical Impressions(s) / ED Diagnoses   Final diagnoses:  Precordial pain  SOB (shortness of breath)    ED Discharge Orders    None       Fredia Sorrow, MD 06/24/18 1659

## 2018-06-24 NOTE — Consult Note (Addendum)
Admit date: 06/24/2018 Referring Physician  Dr. McDiarmid Primary Physician  Lucila Maine Primary Cardiologist  None Reason for Consultation  Chest pain with elevated troponin  HPI: Lucas [name] is a 67 y.o. male who is being seen today for the evaluation of chest pain and elevated troponin at the request of Dr. Wendy Poet.  This is a 67yo male with no prior cardiac hx but h/o HTN, hyperlipidemia and ongoing tobacco abuse who was just discharged from [facility] after admission for hematemesis and found to have a Mallory Weiss tear on EGD 8/30 with hemostatic clip placement and epi injection.  He was discharged yesterday and placed on 8 weeks of PPI and Carafate.  Hbg was 8 on discharge.  Today had sudden onset of CP after an episode of severe coughing and was associated with SOB, diaphoresis and bilateral arm numbness.  He presented to the ER and was given SL NTG with resolution of CP.  EKG showed new T wave inversions in the lateral leads but trop was neg x 1.  Apparently has had frequent coughing fits over the past few months and started on Singulair recently for possible allergic trigger.  Chest CT 11/2017 showed diffuse significant coronary artery calcifications.  Trop minimally elevated today at 0.08 and 0.07.  He had a mildly elevated trop at 0.04 on last admission.  In questioning him further, he says that the CP he had today was very similar to the pain he presented with at time of Mack Guise tear. Currently denies any CP.    PMH:   Past Medical History:  Diagnosis Date  . Allergy   . COPD 12/21/2006  . Environmental allergies   . HYPERLIPIDEMIA 12/21/2006  . HYPERTENSION, BENIGN SYSTEMIC 12/21/2006  . TOBACCO DEPENDENCE 12/21/2006     PSH:   Past Surgical History:  Procedure Laterality Date  . ANKLE SURGERY     left ankle  . APPLICATION OF WOUND VAC  01/06/2012   Procedure: APPLICATION OF WOUND VAC;  Surgeon: Meredith Pel, MD;  Location: [facility];  Service: Orthopedics;  Laterality:  Left;  . APPLICATION OF WOUND VAC Right 10/17/2015   Procedure: APPLICATION OF WOUND VAC;  Surgeon: Leandrew Koyanagi, MD;  Location: [place];  Service: Orthopedics;  Laterality: Right;  . BIOPSY  06/22/2018   Procedure: BIOPSY;  Surgeon: Dorothea Ogle v, DO;  Location: [facility];  Service: Gastroenterology;;  . ESOPHAGOGASTRODUODENOSCOPY (EGD) WITH PROPOFOL N/A 06/22/2018   Procedure: ESOPHAGOGASTRODUODENOSCOPY (EGD) WITH PROPOFOL;  Surgeon: [id number] v, DO;  Location: [place] [facility];  Service: Gastroenterology;  Laterality: N/A;  . EXTERNAL FIXATION LEG  01/06/2012   Procedure: EXTERNAL FIXATION LEG;  Surgeon: Meredith Pel, MD;  Location: [facility];  Service: Orthopedics;  Laterality: Left;  Smith-nephew external fixator set  . EXTERNAL FIXATION LEG Right 09/16/2015   Procedure: EXTERNAL FIXATION LEG/LARGE;  Surgeon: Mcarthur Rossetti, MD;  Location: [place];  Service: Orthopedics;  Laterality: Right;  . EXTERNAL FIXATION REMOVAL Right 09/22/2015   Procedure: REMOVAL EXTERNAL FIXATION LEG;  Surgeon: Mcarthur Rossetti, MD;  Location: [place];  Service: Orthopedics;  Laterality: Right;  . FASCIOTOMY  01/10/2012   Procedure: FASCIOTOMY;  Surgeon: Rozanna Box, MD;  Location: [place];  Service: Orthopedics;  Laterality: Left;  status post fasciotomy with wound vac left calf; adjustment external fixator left tibia under fluoro; retention suture/wound vac placement left lateral calf wound  . FASCIOTOMY  01/06/2012   Procedure: FASCIOTOMY;  Surgeon: Meredith Pel, MD;  Location: [facility];  Service: Orthopedics;  Laterality: Left;  Four compartment fasciotomy  . HOT HEMOSTASIS N/A 06/22/2018   Procedure: HEMOSTASIS;  Surgeon: [id number] v, DO;  Location: [facility];  Service: Gastroenterology;  Laterality: N/A;  Epinepherine and clip placed  . I&D EXTREMITY  01/12/2012   Procedure: IRRIGATION AND DEBRIDEMENT EXTREMITY;  Surgeon: Rozanna Box, MD;  Location: [place];  Service:  Orthopedics;  Laterality: Left;  LAYERD CLOSURE LEFT LEG WOUND  . I&D EXTREMITY Right 10/17/2015   Procedure: IRRIGATION AND DEBRIDEMENT EXTREMITY;  Surgeon: Leandrew Koyanagi, MD;  Location: [place];  Service: Orthopedics;  Laterality: Right;  . I&D EXTREMITY Right 10/20/2015   Procedure: REPEAT IRRIGATION AND DEBRIDEMENT EXTREMITY, right lower leg;  Surgeon: Mcarthur Rossetti, MD;  Location: [place];  Service: Orthopedics;  Laterality: Right;  . KNEE SURGERY     left knee and right knee  . MASS EXCISION Left 09/22/2015   Procedure: EXCISION MASS;  Surgeon: Mcarthur Rossetti, MD;  Location: [place];  Service: Orthopedics;  Laterality: Left;  . ORIF TIBIA PLATEAU  02/07/2012   Procedure: OPEN REDUCTION INTERNAL FIXATION (ORIF) TIBIAL PLATEAU;  Surgeon: Rozanna Box, MD;  Location: [place];  Service: Orthopedics;  Laterality: Left;  ORIF LEFT TIBIAL PLATEAU/ REMOVE EXTERNAL FIXATION LEFT LEG  . ORIF TIBIA PLATEAU Right 09/22/2015   Procedure: REMOVAL OF EXTERNAL FIXATOR RIGHT LEG, OPEN REDUCTION INTERNAL FIXATION (ORIF) RIGHT TIBIAL PLATEAU, EXCISION SMALL MASS LEFT LEG;  Surgeon: Mcarthur Rossetti, MD;  Location: [place];  Service: Orthopedics;  Laterality: Right;  . SKIN SPLIT GRAFT Right 10/23/2015   Procedure: RIGHT GASTROC FLAP SPLIT THICKNESS SKIN GRAFT FROM RIGHT THING TO RIGHT LEG;  Surgeon: Irene Limbo, MD;  Location: [place];  Service: Plastics;  Laterality: Right;  . WRIST SURGERY     left    Allergies:  Ace inhibitors; Ampicillin; Penicillins; Codeine; and Lisinopril Prior to Admit Meds:   Medications Prior to Admission  Medication Sig Dispense Refill Last Dose  . albuterol (PROVENTIL HFA;VENTOLIN HFA) 108 (90 Base) MCG/ACT inhaler INHALE 2 PUFFS INTO THE LUNGS EVERY 4 HOURS AS NEEDED FOR WHEEZING OR SHORTNESS OF BREATH 18 g 6 06/24/2018 at Unknown time  . cetirizine (ZYRTEC) 10 MG tablet Take 1 tablet (10 mg total) by mouth daily. 30 tablet 11 Past Week at Unknown time  .  montelukast (SINGULAIR) 10 MG tablet Take 1 tablet (10 mg total) by mouth at bedtime. 30 tablet 3 Past Week at Unknown time  . rosuvastatin (CRESTOR) 20 MG tablet Take 1 tablet (20 mg total) by mouth daily. 90 tablet 3 Past Week at Unknown time  . pantoprazole (PROTONIX) 40 MG tablet Take 1 tablet (40 mg total) by mouth 2 (two) times daily. 108 tablet 0 not yet  . sucralfate (CARAFATE) 1 GM/10ML suspension Take 10 mLs (1 g total) by mouth 4 (four) times daily for 13 days. 420 mL 0   . umeclidinium bromide (INCRUSE ELLIPTA) 62.5 MCG/INH AEPB Inhale 1 puff into the lungs daily. 32 each 3 not yet   Fam HX:    Family History  Problem Relation Age of Onset  . Emphysema Father    Social HX:    Social History   Socioeconomic History  . Marital status: Married    Spouse name: Not on file  . Number of children: Not on file  . Years of education: Not on file  . Highest education level: Not on file  Occupational History  . Not on file  Social Needs  . Financial resource strain: Not on file  . Food insecurity:    Worry: Not on file    Inability: Not on file  . Transportation needs:    Medical: Not on file    Non-medical: Not on file  Tobacco Use  . Smoking status: Current Every Day Smoker    Packs/day: 0.50    Years: 51.00    Pack years: 25.50    Types: Cigarettes  . Smokeless tobacco: Former Systems developer    Types: Snuff, Sarina Ser    Quit date: 01/05/1965  Substance and Sexual Activity  . Alcohol use: Yes    Alcohol/week: 3.0 - 4.0 standard drinks    Types: 3 - 4 Cans of beer per week    Comment: 3-4 cans beer daily  . Drug use: No  . Sexual activity: Yes    Partners: Female  Lifestyle  . Physical activity:    Days per week: Not on file    Minutes per session: Not on file  . Stress: Not on file  Relationships  . Social connections:    Talks on phone: Not on file    Gets together: Not on file    Attends religious service: Not on file    Active member of club or organization: Not on  file    Attends meetings of clubs or organizations: Not on file    Relationship status: Not on file  . Intimate partner violence:    Fear of current or ex partner: Not on file    Emotionally abused: Not on file    Physically abused: Not on file    Forced sexual activity: Not on file  Other Topics Concern  . Not on file  Social History Narrative  . Not on file     ROS:  All  ROS were addressed and are negative except what is stated in the HPI  Physical Exam: Blood pressure (!) 147/76, pulse 96, temperature 98.1 F (36.7 C), temperature source Oral, resp. rate 18, height 5\' 11"  (1.803 m), weight 68.7 kg, SpO2 100 %.    General: Well developed, well nourished, in no acute distress Head: Eyes PERRLA, No xanthomas.   Normal cephalic and atramatic  Lungs:   Clear bilaterally to auscultation and percussion. Heart:   HRRR S1 S2 Pulses are 2+ & equal.            No carotid bruit. No JVD.  No abdominal bruits. No femoral bruits. Abdomen: Bowel sounds are positive, abdomen soft and non-tender without masses or                  Hernia's noted. Msk:  Back normal, normal gait. Normal strength and tone for age. Extremities:   No clubbing, cyanosis or edema.  DP +1 Neuro: Alert and oriented X 3. Psych:  Good affect, responds appropriately    Labs:   Lab Results  Component Value Date   WBC 4.6 06/24/2018   HGB 8.2 (L) 06/24/2018   HCT 24.0 (L) 06/24/2018   MCV 100.4 (H) 06/24/2018   PLT 195 06/24/2018    Recent Labs  Lab 06/24/18 1034 06/24/18 1056  NA 144 143  K 3.6 3.6  CL 111 108  CO2 26  --   BUN 5* 4*  CREATININE 0.98 1.00  CALCIUM 8.9  --   PROT 5.9*  --   BILITOT 0.7  --   ALKPHOS 67  --   ALT 23  --   AST 33  --  GLUCOSE 108* 104*   No results found for: PTT Lab Results  Component Value Date   INR 1.00 06/24/2018   INR 1.15 06/22/2018   INR 1.03 06/21/2018   Lab Results  Component Value Date   TROPONINI 0.07 (HH) 06/24/2018     Lab Results  Component  Value Date   CHOL 221 (H) 10/13/2016   CHOL 223 (H) 01/28/2014   CHOL 221 (H) 01/03/2012   Lab Results  Component Value Date   HDL 75 10/13/2016   HDL 85 01/28/2014   HDL 51 01/03/2012   Lab Results  Component Value Date   LDLCALC 120 (H) 10/13/2016   LDLCALC 125 (H) 01/28/2014   LDLCALC 132 (H) 01/03/2012   Lab Results  Component Value Date   TRIG 130 10/13/2016   TRIG 65 01/28/2014   TRIG 191 (H) 01/03/2012   Lab Results  Component Value Date   CHOLHDL 2.9 10/13/2016   CHOLHDL 2.6 01/28/2014   CHOLHDL 4.3 01/03/2012   Lab Results  Component Value Date   LDLDIRECT 105 (H) 05/16/2017   LDLDIRECT 150 (H) 02/13/2013      Radiology:  Dg Chest Port 1 View  Result Date: 06/24/2018 CLINICAL DATA:  67 year old male with sudden onset of transient chest pain. Recently discharged from the hospital yesterday. EXAM: PORTABLE CHEST 1 VIEW COMPARISON:  Prior chest x-ray 06/21/2017 FINDINGS: Stable borderline cardiomegaly. The mediastinal contours are unchanged. Atherosclerotic calcifications again noted in the transverse aorta. No evidence of pneumothorax, new pulmonary edema, airspace opacity or pleural effusion. Stable trace right pleural effusion versus pleural thickening in the periphery of the lung. Chronic bronchitic changes and interstitial prominence are similar compared to prior. Remote healed right-sided rib fractures again noted. 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 IMPRESSION: Stable chest x-ray without evidence of acute cardiopulmonary process. Electronically Signed   By: Jacqulynn Cadet M.D.   On: 06/24/2018 11:04     Telemetry    NSR - Personally Reviewed  ECG    NSR with new downsloping ST segments with T wave inversions in the inferolateral leads - Personally Reviewed   ASSESSMENT/PLAN:   1.  Chest pain - could consider GI in origin given his recent Mallory Weiss tear and fact that pain is identical to the pain he presented with a  few days ago in setting of [employer] tear but his pain resolved with SL NTG and he now has new ST changes in the inferolateral leads with known significant coronary artery calcifications on Chest CT in 11/2017.  Minimally elevated trop gives more concern to a cardiac etiology.  He does have anemia with a Hbg of 8 so could also be subendocardial ischemia in the setting of underlying CAD and significant anemia.   -he should be anticoagulated but would like GI guidance since he just had GI bleed with Mack Guise tear s/p clipping and still significantly anemic.   -recommend GI input into anticoagulation including ASA -start IV NTG gtt -start lopressor 12.5mg  BID and continue statin -check FLP in am -check 2D echo to assess LVF in am -continue to cycle trop -cardiac cath vs. Coronary CTA pending results of troponin trend and echo  2.  HTN -BP controlled -has not been on antihypertensive Rx  3.  Hyperlipidemia -continue statin -check FLP in am  Fransico Him, MD  06/24/2018  10:49 PM

## 2018-06-24 NOTE — H&P (Addendum)
[facility] Admission History and Physical Service Pager: [phone]  Patient name: Lucas [name] Medical record number: [id number] Date of birth: 13-Oct-1951 Age: 67 y.o. Gender: male  Primary Care Provider: Steve Rattler, DO Consultants: None  Code Status: Full   Chief Complaint: Chest pain   Assessment and Plan: Lucas [name] is a 67 y.o. male presenting with acute onset chest pain associated with dyspnea, diaphoresis, and paresthesias. PMH is significant for COPD, HTN, daily alcohol use, tobacco use, chronic hyponatremia, and HLD.   Chest pain: Pt reports sudden onset substernal chest pressure after excessive coughing, associated with SOB, diaphoresis, and bilateral arm paresthesias, relieved by nitro provided by ED. Afebrile and hemodynamically stable on arrival. Concern for ACS given typical chest pain relieved by nitro administration and EKG obtained by EMS showing T wave inversions in the lateral leads confirmed by ED provider, however reassured troponin is negative x1 and EKG in the ED without ischemic changes. Could consider PE, especially given recent hospitalization with immobilization without anti-coagulation in the setting of GI bleed, however he is not tachycardic, no signs of DVT, and current symptoms have already resolved. Wells score for PE 1.5, low risk. This chest pain could also been secondary musculoskeletal irritation precipitated by frequent daily coughing fits. Also potential for anxiety component, as the patient notes he is often anxious after coughing fits and has benefited from Ativan in the ED and during previous similar episode during last admission. Unlikely pneumonia, CXR without cardiopulmonary disease.  -Admit to telemetry; attending Dr. McDiarmid -EKG in the am -S/p nitro and aspirin via EMS; cont nitro PRN chest pain  -Trend troponin, neg x1   -Control of COPD "coughing fits" as below   -BMP in the am  -Obtain Mg level  -Vitals per  routine; monitor CM   -Consideration for cardiology consult pending further evaluation; may benefit from stress test   Mallory weiss tear s/p hemostatic clip placement: Recent hospitalization for hematemesis, found to have mallory weiss tear on EGD 8/30 with hemostatic clip placement and epi injection. Discharged on 8/31 with 8 weeks of PPI and Carafate per GI recs, however has not been able to fill these prescriptions. Denies any further episodes of hematemesis, no BM since EGD. Hgb stable at 8.3 on admit, 8.0 on 8/31.  -Cont Protonix 40mg  BID >> after 8 weeks titrate down to maintain control of GERD  -Cont sucralfate suspension 1g PO QID x2 weeks (started 8/31-)  -No aspirin, ibuprofen, or other NSAIDs until at least 9/3 (5 days after procedure), however did receive aspirin via EMS   -F/u surgical pathology from EGD    COPD: Currently denies SOB or coughing, however has had frequent dry coughing fits (sometimes with clear sputum production) for the past few months, including coughing fit this am precipitating CP as above. On exam, note slight expiratory wheeze and rhonchi in posterior lung fields without increased WOB, on 2L [place] however satting 100%. Recently started on Singulair to help with allergic component of triggers, but had not filled that prescription prior to most recent admission. Takes umeclidinium daily and albuterol as needed- but has been taking this daily as well. Could consider addition of LABA given concern for frequent symptoms, however dry coughing could be potentially related to allergies vs acid reflux vs "smoker's" cough. No signs of malignancy on CT chest 11/2017.  -Cont home umeclidinium  -Cont home Singulair -Loratadine 10mg  daily (in place of home cetirizine)  -Albuterol q4 hours PRN  -Wean  oxygen as tolerated   Borderline prolonged QTc: 479 on EKG, new onset. Previously wnl.  -Obtain Mg level -Avoid QT prolonging agents   Hypertension: Stable, normotensive.   Discontinued to his Candesartan-HCTZ on discharge 8/31 for consideration of restarting outpatient because he remained normotensive without this on board for last admission.  -Cont to hold home Candesartan-HCTZ  -Monitor BP   Alcohol use disorder: Endorses drinking 4-5 16oz beers daily (previously reported 3-4 on last admission), with occasional liquor. Last drink last night, 1 shot of vodka and 1 beer per patient. Denies any previous hospitalizations for withdrawal.  -CIWA protocol with ativan -Thiamine, folate, and multivitamin  Macrocytic anemia: Stable.  Hgb 8.3 on admit, 8.0 on 8/31. Recent decrease due to blood loss with acute GI bleed, however has remained stable since. MCV 100.4, likely associated with chronic alcoholism.  -Monitor CBC   Chronic Hyponatremia: Stable, improved.  Na 143 on admission.  -Monitor BMP   Tobacco use: 1 PPD (reported 1/2 PPD previously). Did not require a nicotine patch for recent admission.  -Nicotine patch as needed   Allergies: On zyrtec and singulair at home - Continue singulair and claritin (on formulary)  Hyperlipidemia: Last lipid panel in 2017, LDL 120. Takes rosuvastatin 20mg  at home.  -Cont home rosuvastatin   FEN/GI: Heart Healthy Diet, mIVF 75 ml/hr x8 hours Prophylaxis: SCDs given recent GI bleed   Disposition: Admit for observation for ACS rule out  History of Present Illness:  Lucas [name] is a 67 y.o. male, with a past history of COPD, daily alcohol use, tobacco use, and recent upper GI via mallory weiss tear s/p hemostatic clip placement (8/30), presenting with acute onset chest pain. He states after waking up this morning around 8:30am, he had a "coughing fit" followed diffuse chest tightness, dyspnea, and diaphoresis. Stated the non-pleuritic chest pain felt like "someone pushing on his chest," and radiated to his back with bilateral arm paresthesias. No association with food, he had not had breakfast prior to this. He tried using  his albuterol inhaler without relief. This pain lasted around 30 minutes, relief by nitro provided by EMS. He states this episode was similar to the one he experienced during his recent hospitalization (discharged 8/31), however notes it was much worse. He does note he is often anxious after coughing fits, however was not feeling anxious this morning. He has been having these coughing fits almost daily for at least the past few months.    During his most recent admission, he experienced a similar episode that was also precipitated by a "coughing fit," but resolved with Ativan and breathing treatments. No EKG changes at that time and troponin trended flat.   Of note, he did have the recent mallory weiss tear s/p hemostatic clip, however denies any further hematemesis since discharge. No BM since his EGD on 8/30. Denies any nausea, has been tolerated a full diet since he has been home. However, has not been able fill his prescriptions he was sent home with.   On arrival, he was afebrile and hemodynamically stable. ED physician notes the EKG performed by EMS showed new T-wave inversions in lateral leads, however EKG obtained in the ED no longer showing these, only showing sinus tachycardia with borderline prolonged QT of 479. Troponin negative x1. CXR without acute cardiopulmonary disease. CBC and CMP notable for glucose 108, Hgb 8.3, MCV 100.4, WBC 4.6, Cr 0.98. He received a 590ml NS bolus and 1mg  ativan for anxiety.  No further CP on evaluation.  Review Of Systems: Per HPI with the following additions:   Review of Systems  Constitutional: Positive for diaphoresis. Negative for chills and fever.  Respiratory: Positive for cough and shortness of breath. Negative for hemoptysis and sputum production.   Cardiovascular: Positive for chest pain. Negative for palpitations, orthopnea, leg swelling and PND.  Gastrointestinal: Positive for constipation. Negative for abdominal pain, nausea and vomiting.   Genitourinary: Negative for dysuria.  Skin: Negative for rash.  Neurological: Positive for tingling. Negative for dizziness, tremors, sensory change, weakness and headaches.    Patient Active Problem List   Diagnosis Date Noted  . Mallory-Weiss tear   . Gastritis and gastroduodenitis   . Cirrhosis of liver ([place])   . Hematemesis 06/21/2018  . Acute upper GI bleed 06/21/2018  . Acute blood loss anemia 06/21/2018  . Tubular adenoma of colon 01/24/2017  . Healthcare maintenance 10/13/2016  . HTN (hypertension) 02/13/2013  . Ganglion cyst 01/03/2012  . PERIPHERAL NEUROPATHY 07/15/2010  . Alcohol use disorder, severe, dependence ([place]) 05/14/2009  . DEFORMITY, FOOT NEC, CONGENITAL 08/22/2007  . ALLERGIC RHINITIS, SEASONAL 05/03/2007  . Hyperlipidemia 12/21/2006  . TOBACCO DEPENDENCE 12/21/2006  . COPD, mild ([place]) 12/21/2006    Past Medical History: Past Medical History:  Diagnosis Date  . Allergy   . COPD 12/21/2006  . Environmental allergies   . HYPERLIPIDEMIA 12/21/2006  . HYPERTENSION, BENIGN SYSTEMIC 12/21/2006  . TOBACCO DEPENDENCE 12/21/2006    Past Surgical History: Past Surgical History:  Procedure Laterality Date  . ANKLE SURGERY     left ankle  . APPLICATION OF WOUND VAC  01/06/2012   Procedure: APPLICATION OF WOUND VAC;  Surgeon: Meredith Pel, MD;  Location: [facility];  Service: Orthopedics;  Laterality: Left;  . APPLICATION OF WOUND VAC Right 10/17/2015   Procedure: APPLICATION OF WOUND VAC;  Surgeon: Leandrew Koyanagi, MD;  Location: [place];  Service: Orthopedics;  Laterality: Right;  . EXTERNAL FIXATION LEG  01/06/2012   Procedure: EXTERNAL FIXATION LEG;  Surgeon: Meredith Pel, MD;  Location: [facility];  Service: Orthopedics;  Laterality: Left;  Smith-nephew external fixator set  . EXTERNAL FIXATION LEG Right 09/16/2015   Procedure: EXTERNAL FIXATION LEG/LARGE;  Surgeon: Mcarthur Rossetti, MD;  Location: [place];  Service: Orthopedics;  Laterality: Right;  .  EXTERNAL FIXATION REMOVAL Right 09/22/2015   Procedure: REMOVAL EXTERNAL FIXATION LEG;  Surgeon: Mcarthur Rossetti, MD;  Location: [place];  Service: Orthopedics;  Laterality: Right;  . FASCIOTOMY  01/10/2012   Procedure: FASCIOTOMY;  Surgeon: Rozanna Box, MD;  Location: [place];  Service: Orthopedics;  Laterality: Left;  status post fasciotomy with wound vac left calf; adjustment external fixator left tibia under fluoro; retention suture/wound vac placement left lateral calf wound  . FASCIOTOMY  01/06/2012   Procedure: FASCIOTOMY;  Surgeon: Meredith Pel, MD;  Location: [facility];  Service: Orthopedics;  Laterality: Left;  Four compartment fasciotomy  . I&D EXTREMITY  01/12/2012   Procedure: IRRIGATION AND DEBRIDEMENT EXTREMITY;  Surgeon: Rozanna Box, MD;  Location: [place];  Service: Orthopedics;  Laterality: Left;  LAYERD CLOSURE LEFT LEG WOUND  . I&D EXTREMITY Right 10/17/2015   Procedure: IRRIGATION AND DEBRIDEMENT EXTREMITY;  Surgeon: Leandrew Koyanagi, MD;  Location: [place];  Service: Orthopedics;  Laterality: Right;  . I&D EXTREMITY Right 10/20/2015   Procedure: REPEAT IRRIGATION AND DEBRIDEMENT EXTREMITY, right lower leg;  Surgeon: Mcarthur Rossetti, MD;  Location: [place];  Service: Orthopedics;  Laterality: Right;  . KNEE  SURGERY     left knee and right knee  . MASS EXCISION Left 09/22/2015   Procedure: EXCISION MASS;  Surgeon: Mcarthur Rossetti, MD;  Location: [place];  Service: Orthopedics;  Laterality: Left;  . ORIF TIBIA PLATEAU  02/07/2012   Procedure: OPEN REDUCTION INTERNAL FIXATION (ORIF) TIBIAL PLATEAU;  Surgeon: Rozanna Box, MD;  Location: [place];  Service: Orthopedics;  Laterality: Left;  ORIF LEFT TIBIAL PLATEAU/ REMOVE EXTERNAL FIXATION LEFT LEG  . ORIF TIBIA PLATEAU Right 09/22/2015   Procedure: REMOVAL OF EXTERNAL FIXATOR RIGHT LEG, OPEN REDUCTION INTERNAL FIXATION (ORIF) RIGHT TIBIAL PLATEAU, EXCISION SMALL MASS LEFT LEG;  Surgeon: Mcarthur Rossetti, MD;   Location: [place];  Service: Orthopedics;  Laterality: Right;  . SKIN SPLIT GRAFT Right 10/23/2015   Procedure: RIGHT GASTROC FLAP SPLIT THICKNESS SKIN GRAFT FROM RIGHT THING TO RIGHT LEG;  Surgeon: Irene Limbo, MD;  Location: [place];  Service: Plastics;  Laterality: Right;  . WRIST SURGERY     left    Social History: Social History   Tobacco Use  . Smoking status: Current Every Day Smoker    Packs/day: 0.50    Years: 51.00    Pack years: 25.50    Types: Cigarettes  . Smokeless tobacco: Former User    Types: Snuff, Sarina Ser    Quit date: 01/05/1965  Substance Use Topics  . Alcohol use: Yes    Alcohol/week: 3.0 - 4.0 standard drinks    Types: 3 - 4 Cans of beer per week    Comment: 3-4 cans beer daily  . Drug use: No   Additional social history: Smokes 1 ppd. Denies illicit drug use. Reports drinking a shot vodka and a 12oz can of beer last night; drinks 4-5 cans of 16oz beers per day.  Please also refer to relevant sections of EMR.  Family History: Family History  Problem Relation Age of Onset  . Emphysema Father     Allergies and Medications: Allergies  Allergen Reactions  . Ace Inhibitors Swelling  . Ampicillin Nausea And Vomiting  . Penicillins Nausea And Vomiting  . Codeine Nausea And Vomiting  . Lisinopril Swelling    REACTION: angioedema   No current facility-administered medications on file prior to encounter.    Current Outpatient Medications on File Prior to Encounter  Medication Sig Dispense Refill  . albuterol (PROVENTIL HFA;VENTOLIN HFA) 108 (90 Base) MCG/ACT inhaler INHALE 2 PUFFS INTO THE LUNGS EVERY 4 HOURS AS NEEDED FOR WHEEZING OR SHORTNESS OF BREATH 18 g 6  . cetirizine (ZYRTEC) 10 MG tablet Take 1 tablet (10 mg total) by mouth daily. 30 tablet 11  . montelukast (SINGULAIR) 10 MG tablet Take 1 tablet (10 mg total) by mouth at bedtime. 30 tablet 3  . rosuvastatin (CRESTOR) 20 MG tablet Take 1 tablet (20 mg total) by mouth daily. 90 tablet 3  .  pantoprazole (PROTONIX) 40 MG tablet Take 1 tablet (40 mg total) by mouth 2 (two) times daily. 108 tablet 0  . sucralfate (CARAFATE) 1 GM/10ML suspension Take 10 mLs (1 g total) by mouth 4 (four) times daily for 13 days. 420 mL 0  . umeclidinium bromide (INCRUSE ELLIPTA) 62.5 MCG/INH AEPB Inhale 1 puff into the lungs daily. 90 each 3  . [DISCONTINUED] loratadine (CLARITIN) 10 MG tablet Take 1 tablet (10 mg total) by mouth daily. 30 tablet 6    Objective: BP 130/64   Pulse 87   Temp 97.9 F (36.6 C) (Oral)   Resp 19  Ht 5\' 11"  (1.803 m)   Wt 68.7 kg   SpO2 100%   BMI 21.12 kg/m  Exam: General: Alert, NAD HEENT: NCAT, MM dry, oropharynx nonerythematous  Cardiac: RRR no m/g/r Lungs: Clear anteriorly with distant breath sounds, with slight expiratory wheeze and rhonchi in posterior lung fields, no increased WOB, on 2L [place] satting 100%  Abdomen: soft, slightly tender in periumbilical and epigastric region, non-distended, normoactive BS Msk: Moves all extremities spontaneously  Neuro: Alert and oriented x3. Follows commands. EOMI with resting bilateral horizontal nystagmus, pupils equal and reactive. CN 2-12 intact, Full 5/5 strength BUE and BLE. Sensation intact BUE.  Ext: Warm, dry, 2+ distal pulses, no edema  Skin: No rash or bruising noted  Psych: Appropriate affect  Labs and Imaging: CBC BMET  Recent Labs  Lab 06/24/18 1034 06/24/18 1056  WBC 4.6  --   HGB 8.3* 8.2*  HCT 25.2* 24.0*  PLT 195  --    Recent Labs  Lab 06/24/18 1034 06/24/18 1056  NA 144 143  K 3.6 3.6  CL 111 108  CO2 26  --   BUN 5* 4*  CREATININE 0.98 1.00  GLUCOSE 108* 104*  CALCIUM 8.9  --      Dg Chest Port 1 View  Result Date: 06/24/2018 CLINICAL DATA:  67 year old male with sudden onset of transient chest pain. Recently discharged from the hospital yesterday. EXAM: PORTABLE CHEST 1 VIEW COMPARISON:  Prior chest x-ray 06/21/2017 FINDINGS: Stable borderline cardiomegaly. The mediastinal contours  are unchanged. Atherosclerotic calcifications again noted in the transverse aorta. No evidence of pneumothorax, new pulmonary edema, airspace opacity or pleural effusion. Stable trace right pleural effusion versus pleural thickening in the periphery of the lung. Chronic bronchitic changes and interstitial prominence are similar compared to prior. Remote healed right-sided rib fractures again noted. 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 IMPRESSION: Stable chest x-ray without evidence of acute cardiopulmonary process. Electronically Signed   By: Jacqulynn Cadet M.D.   On: 06/24/2018 11:04    Patriciaann Clan, DO 06/24/2018, 1:28 PM PGY-1, [facility] Intern pager: [phone], text pages welcome  FPTS Upper-Level Resident Addendum   I have independently interviewed and examined the patient. I have discussed the above with the original author and agree with their documentation. My edits for correction/addition/clarification are in purple. Please see also any attending notes.    [place] Jamy Whyte, DO PGY-2, [facility] 06/24/2018 10:24 PM  FPTS Service pager: [phone] (text pages welcome through [facility])

## 2018-06-25 ENCOUNTER — Encounter (HOSPITAL_COMMUNITY): Payer: Self-pay | Admitting: Family Medicine

## 2018-06-25 DIAGNOSIS — F419 Anxiety disorder, unspecified: Secondary | ICD-10-CM | POA: Diagnosis present

## 2018-06-25 DIAGNOSIS — I2510 Atherosclerotic heart disease of native coronary artery without angina pectoris: Secondary | ICD-10-CM | POA: Diagnosis not present

## 2018-06-25 DIAGNOSIS — G629 Polyneuropathy, unspecified: Secondary | ICD-10-CM | POA: Diagnosis present

## 2018-06-25 DIAGNOSIS — R7989 Other specified abnormal findings of blood chemistry: Secondary | ICD-10-CM | POA: Diagnosis not present

## 2018-06-25 DIAGNOSIS — K7460 Unspecified cirrhosis of liver: Secondary | ICD-10-CM | POA: Diagnosis present

## 2018-06-25 DIAGNOSIS — R05 Cough: Secondary | ICD-10-CM | POA: Diagnosis not present

## 2018-06-25 DIAGNOSIS — D62 Acute posthemorrhagic anemia: Secondary | ICD-10-CM

## 2018-06-25 DIAGNOSIS — M818 Other osteoporosis without current pathological fracture: Secondary | ICD-10-CM | POA: Diagnosis present

## 2018-06-25 DIAGNOSIS — M87059 Idiopathic aseptic necrosis of unspecified femur: Secondary | ICD-10-CM | POA: Diagnosis present

## 2018-06-25 DIAGNOSIS — R748 Abnormal levels of other serum enzymes: Secondary | ICD-10-CM | POA: Diagnosis not present

## 2018-06-25 DIAGNOSIS — Z888 Allergy status to other drugs, medicaments and biological substances status: Secondary | ICD-10-CM | POA: Diagnosis not present

## 2018-06-25 DIAGNOSIS — Z8719 Personal history of other diseases of the digestive system: Secondary | ICD-10-CM

## 2018-06-25 DIAGNOSIS — F1020 Alcohol dependence, uncomplicated: Secondary | ICD-10-CM | POA: Diagnosis present

## 2018-06-25 DIAGNOSIS — K219 Gastro-esophageal reflux disease without esophagitis: Secondary | ICD-10-CM | POA: Diagnosis present

## 2018-06-25 DIAGNOSIS — I700 Atherosclerosis of aorta: Secondary | ICD-10-CM | POA: Diagnosis present

## 2018-06-25 DIAGNOSIS — S42009A Fracture of unspecified part of unspecified clavicle, initial encounter for closed fracture: Secondary | ICD-10-CM

## 2018-06-25 DIAGNOSIS — E785 Hyperlipidemia, unspecified: Secondary | ICD-10-CM | POA: Diagnosis present

## 2018-06-25 DIAGNOSIS — B9681 Helicobacter pylori [H. pylori] as the cause of diseases classified elsewhere: Secondary | ICD-10-CM | POA: Diagnosis present

## 2018-06-25 DIAGNOSIS — Z8781 Personal history of (healed) traumatic fracture: Secondary | ICD-10-CM

## 2018-06-25 DIAGNOSIS — I340 Nonrheumatic mitral (valve) insufficiency: Secondary | ICD-10-CM | POA: Diagnosis not present

## 2018-06-25 DIAGNOSIS — R778 Other specified abnormalities of plasma proteins: Secondary | ICD-10-CM | POA: Diagnosis not present

## 2018-06-25 DIAGNOSIS — E7800 Pure hypercholesterolemia, unspecified: Secondary | ICD-10-CM

## 2018-06-25 DIAGNOSIS — J449 Chronic obstructive pulmonary disease, unspecified: Secondary | ICD-10-CM

## 2018-06-25 DIAGNOSIS — I25110 Atherosclerotic heart disease of native coronary artery with unstable angina pectoris: Secondary | ICD-10-CM | POA: Diagnosis present

## 2018-06-25 DIAGNOSIS — K2990 Gastroduodenitis, unspecified, without bleeding: Secondary | ICD-10-CM

## 2018-06-25 DIAGNOSIS — K7030 Alcoholic cirrhosis of liver without ascites: Secondary | ICD-10-CM

## 2018-06-25 DIAGNOSIS — S2249XA Multiple fractures of ribs, unspecified side, initial encounter for closed fracture: Secondary | ICD-10-CM

## 2018-06-25 DIAGNOSIS — R931 Abnormal findings on diagnostic imaging of heart and coronary circulation: Secondary | ICD-10-CM | POA: Diagnosis not present

## 2018-06-25 DIAGNOSIS — Z885 Allergy status to narcotic agent status: Secondary | ICD-10-CM | POA: Diagnosis not present

## 2018-06-25 DIAGNOSIS — R072 Precordial pain: Secondary | ICD-10-CM | POA: Diagnosis present

## 2018-06-25 DIAGNOSIS — R053 Chronic cough: Secondary | ICD-10-CM | POA: Diagnosis present

## 2018-06-25 DIAGNOSIS — I10 Essential (primary) hypertension: Secondary | ICD-10-CM | POA: Diagnosis present

## 2018-06-25 DIAGNOSIS — F17200 Nicotine dependence, unspecified, uncomplicated: Secondary | ICD-10-CM

## 2018-06-25 DIAGNOSIS — J301 Allergic rhinitis due to pollen: Secondary | ICD-10-CM | POA: Diagnosis not present

## 2018-06-25 DIAGNOSIS — J302 Other seasonal allergic rhinitis: Secondary | ICD-10-CM | POA: Diagnosis present

## 2018-06-25 DIAGNOSIS — I272 Pulmonary hypertension, unspecified: Secondary | ICD-10-CM | POA: Diagnosis present

## 2018-06-25 DIAGNOSIS — Z881 Allergy status to other antibiotic agents status: Secondary | ICD-10-CM | POA: Diagnosis not present

## 2018-06-25 DIAGNOSIS — I249 Acute ischemic heart disease, unspecified: Secondary | ICD-10-CM | POA: Diagnosis not present

## 2018-06-25 DIAGNOSIS — Z880 Allergy status to penicillin: Secondary | ICD-10-CM | POA: Diagnosis not present

## 2018-06-25 DIAGNOSIS — K2970 Gastritis, unspecified, without bleeding: Secondary | ICD-10-CM

## 2018-06-25 DIAGNOSIS — R079 Chest pain, unspecified: Secondary | ICD-10-CM

## 2018-06-25 DIAGNOSIS — F17210 Nicotine dependence, cigarettes, uncomplicated: Secondary | ICD-10-CM | POA: Diagnosis present

## 2018-06-25 DIAGNOSIS — E871 Hypo-osmolality and hyponatremia: Secondary | ICD-10-CM | POA: Diagnosis present

## 2018-06-25 HISTORY — DX: Idiopathic aseptic necrosis of unspecified femur: M87.059

## 2018-06-25 HISTORY — DX: Fracture of unspecified part of unspecified clavicle, initial encounter for closed fracture: S42.009A

## 2018-06-25 HISTORY — DX: Personal history of (healed) traumatic fracture: Z87.81

## 2018-06-25 HISTORY — DX: Multiple fractures of ribs, unspecified side, initial encounter for closed fracture: S22.49XA

## 2018-06-25 LAB — CBC
HEMATOCRIT: 23.4 % — AB (ref 39.0–52.0)
HEMOGLOBIN: 7.6 g/dL — AB (ref 13.0–17.0)
MCH: 32.3 pg (ref 26.0–34.0)
MCHC: 32.5 g/dL (ref 30.0–36.0)
MCV: 99.6 fL (ref 78.0–100.0)
PLATELETS: 190 10*3/uL (ref 150–400)
RBC: 2.35 MIL/uL — ABNORMAL LOW (ref 4.22–5.81)
RDW: 13.4 % (ref 11.5–15.5)
WBC: 4.5 10*3/uL (ref 4.0–10.5)

## 2018-06-25 LAB — BASIC METABOLIC PANEL
ANION GAP: 5 (ref 5–15)
BUN: 6 mg/dL — ABNORMAL LOW (ref 8–23)
CHLORIDE: 112 mmol/L — AB (ref 98–111)
CO2: 25 mmol/L (ref 22–32)
CREATININE: 0.88 mg/dL (ref 0.61–1.24)
Calcium: 8.8 mg/dL — ABNORMAL LOW (ref 8.9–10.3)
GFR calc non Af Amer: >60 mL/min (ref >60)
Glucose, Bld: 94 mg/dL (ref 70–99)
POTASSIUM: 3.5 mmol/L (ref 3.5–5.1)
SODIUM: 142 mmol/L (ref 135–145)

## 2018-06-25 LAB — HEMOGLOBIN AND HEMATOCRIT, BLOOD
HEMATOCRIT: 27.9 % — AB (ref 39.0–52.0)
Hemoglobin: 9.3 g/dL — ABNORMAL LOW (ref 13.0–17.0)

## 2018-06-25 LAB — AMMONIA: AMMONIA: 29 umol/L (ref 9–35)

## 2018-06-25 LAB — MAGNESIUM: MAGNESIUM: 2.1 mg/dL (ref 1.7–2.4)

## 2018-06-25 LAB — PREPARE RBC (CROSSMATCH)

## 2018-06-25 LAB — TROPONIN I: Troponin I: 0.06 ng/mL (ref <0.03)

## 2018-06-25 MED ORDER — POLYETHYLENE GLYCOL 3350 17 G PO PACK: 17.0000 g | PACK | Freq: Every day | ORAL | Status: DC | PRN

## 2018-06-25 MED ORDER — BENZONATATE 100 MG PO CAPS
100.0000 mg | ORAL_CAPSULE | Freq: Three times a day (TID) | ORAL | Status: DC
  Administered 2018-06-25 – 2018-06-27 (×7): 100 mg via ORAL
  Filled 2018-06-25 (×7): qty 1

## 2018-06-25 MED ORDER — FLUTICASONE PROPIONATE 50 MCG/ACT NA SUSP
2.0000 | Freq: Every day | NASAL | Status: DC
  Administered 2018-06-25 – 2018-06-28 (×4): 2 via NASAL
  Filled 2018-06-25: qty 16

## 2018-06-25 MED ORDER — SODIUM CHLORIDE 0.9% IV SOLUTION
Freq: Once | INTRAVENOUS | Status: AC
  Administered 2018-06-25: 13:00:00 via INTRAVENOUS

## 2018-06-25 MED ORDER — GUAIFENESIN-DM 100-10 MG/5ML PO SYRP
5.0000 mL | ORAL_SOLUTION | ORAL | Status: DC | PRN
  Administered 2018-06-25: 5 mL via ORAL
  Filled 2018-06-25: qty 5

## 2018-06-25 MED ORDER — UMECLIDINIUM-VILANTEROL 62.5-25 MCG/INH IN AEPB
1.0000 | INHALATION_SPRAY | Freq: Every day | RESPIRATORY_TRACT | Status: DC
  Administered 2018-06-25 – 2018-06-28 (×4): 1 via RESPIRATORY_TRACT
  Filled 2018-06-25: qty 14

## 2018-06-25 MED ORDER — ASPIRIN EC 81 MG PO TBEC
81.0000 mg | DELAYED_RELEASE_TABLET | Freq: Every day | ORAL | Status: DC
  Administered 2018-06-25 – 2018-06-28 (×3): 81 mg via ORAL
  Filled 2018-06-25 (×3): qty 1

## 2018-06-25 MED ORDER — METOPROLOL TARTRATE 12.5 MG HALF TABLET
12.5000 mg | ORAL_TABLET | Freq: Two times a day (BID) | ORAL | Status: DC
  Administered 2018-06-25 (×3): 12.5 mg via ORAL
  Filled 2018-06-25 (×3): qty 1

## 2018-06-25 MED ORDER — SENNOSIDES-DOCUSATE SODIUM 8.6-50 MG PO TABS: 1.0000 | ORAL_TABLET | Freq: Every evening | ORAL | Status: DC | PRN

## 2018-06-25 MED ORDER — NITROGLYCERIN IN D5W 200-5 MCG/ML-% IV SOLN
0.0000 ug/min | INTRAVENOUS | Status: DC
  Administered 2018-06-25: 5 ug/min via INTRAVENOUS
  Filled 2018-06-25: qty 250

## 2018-06-25 NOTE — Progress Notes (Signed)
Family Medicine Teaching Service Daily Progress Note Intern Pager: [phone]  Patient name: Lucas [name] Medical record number: [id number] Date of birth: 11-01-1950 Age: 67 y.o. Gender: male  Primary Care Provider: Steve Rattler, DO Consultants: Cards Code Status: Full  Pt Overview and Major Events to Date:  Admitted 8/31   Assessment and Plan: Lucas [name] is a 67 y.o. male presenting with acute onset chest pain associated with dyspnea, diaphoresis, and paresthesias. PMH is significant for COPD, HTN, daily alcohol use, tobacco use, chronic hyponatremia, and HLD.   Acute chest pain: Pt had additional episode of CP yesterday following admission, with new ST changes in inferolateral leads. Trop trended flat at 0.01>0.08>0.07>0.06. EKG this am NSR without ischemic changes previously seen, without CP this am. Hemodynamically stable. Continue to have high suspicion for cardiac origin, unstable angina at minimium. However, could potentially related to residual GI concerns vs symptomatic anemia with cardiac demand vs continued coughing and dyspnea related to COPD. Spoke with GI yesterday, stated it was acceptable for anti-coagulation.   -Cardiology consulted; appreciate recs as below; lexiscan Myoview 9/3  -Obtain Echo  -D/c Nitro gtt per cards, nitro PRN for CP  -Cont lopressor 12.5 BID (8/31-) and home statin -Start daily aspirin   -Treatment for anemia and COPD as below  -Monitor CBC  -Vitals per routine   Mallory weiss tear s/p hemostatic clip placement: Recent hospitalization for hematemesis, found to have mallory weiss tear on EGD 8/30 with hemostatic clip placement and epi injection. Denies any further episodes of hematemesis, no BM since EGD. Hgb 7.6 this am, from 8.3.  -Cont Protonix 40mg  BID >> after 8 weeks titrate down to maintain control of GERD  -Cont sucralfate suspension 1g PO QID x2 weeks (started 8/31-)   -F/u surgical pathology from EGD  -Transfuse 1 U pRBCs this am, f/u H  &H    COPD: Having increased dyspnea this am and frequent dry coughing. Dry coughing could be potentially related to allergies vs acid reflux vs "smoker's" cough. -Cont LABA +LAMA ([place])  -Cont home Singulair -Loratadine 10mg  daily (in place of home cetirizine)  -Albuterol q4 hours PRN  -Tessalon 100mg  TID for cough; robitussin as needed  -Wean oxygen as tolerated   Borderline prolonged QTc: Resolved.  479 on EKG at admit, 411 this am. Mg wnl.  -Avoid QT prolonging agents   Hypertension: Stable, normotensive overnight, BP 140-150's this am.  Discontinued to his Candesartan-HCTZ on discharge 8/31 for consideration of restarting outpatient because he remained normotensive without this on board for last admission.  -Cont to monitor BP especially with start of metoprolol yesterday  -Cont metoprolol    Alcohol use disorder: Endorses drinking 4-5 16oz beers daily (previously reported 3-4 on last admission), with occasional liquor. Denies any previous hospitalizations for withdrawal.  -CIWA protocol with ativan: 0>6>1 overnight  -Thiamine, folate, and multivitamin  Macrocytic anemia: Stable.  Hgb 7.6 on admit, 8.3 on admit. Recent decrease due to blood loss with acute GI bleed, however has remained stable since. MCV 100.4, likely associated with chronic alcoholism.  -Transfuse 1 pRBCs, monitor H &H  -Monitor CBC   Chronic Hyponatremia: Stable, improved.  Na 142.  -Monitor BMP   Tobacco use: 1 PPD (reported 1/2 PPD previously). Did not require a nicotine patch for recent admission.  -Nicotine patch as needed   Allergies: On zyrtec and singulair at home - Continue singulair and claritin (on formulary)  Hyperlipidemia: Last lipid panel in 2017, LDL 120. Takes rosuvastatin 20mg  at  home.  -Cont home rosuvastatin   FEN/GI: Heart healthy diet  PPX: SCDs  Disposition: Continued inpatient care   Subjective:  No acute events overnight, had an additional CP episode yesterday but  no further episodes overnight.   Objective: Temp:  [97.7 F (36.5 C)-98.1 F (36.7 C)] 97.8 F (36.6 C) (09/02 0317) Pulse Rate:  [65-96] 70 (09/02 0317) Resp:  [11-20] 19 (09/02 0317) BP: (121-147)/(60-84) 144/80 (09/02 0317) SpO2:  [100 %] 100 % (09/02 0317) Weight:  [68.7 kg-71.2 kg] 71.2 kg (09/02 0319) Physical Exam: General: Alert, Mild increased WOB  HEENT: NCAT, MMM, oropharynx nonerythematous  Cardiac: RRR no m/g/r   Lungs: distant breath sounds, mild increased WOB with tachypnea and supraclavicular retractions, able to talk in full sentences  Abdomen: soft, non-tender, non-distended, normoactive BS Msk: Moves all extremities spontaneously  Ext: Warm, dry, 2+ distal pulses, no edema   Laboratory: Recent Labs  Lab 06/23/18 0626 06/24/18 1034 06/24/18 1056 06/25/18 0308  WBC 4.0 4.6  --  4.5  HGB 8.0* 8.3* 8.2* 7.6*  HCT 23.9* 25.2* 24.0* 23.4*  PLT 177 195  --  190   Recent Labs  Lab 06/21/18 0237  06/22/18 0243 06/23/18 0626 06/24/18 1034 06/24/18 1056 06/25/18 0308  NA 134*   < > 136 138 144 143 142  K 3.5   < > 4.6 4.0 3.6 3.6 3.5  CL 94*   < > 104 108 111 108 112*  CO2 25   < > 28 23 26   --  25  BUN 31*   < > 14 5* 5* 4* 6*  CREATININE 0.96   < > 0.87 0.86 0.98 1.00 0.88  CALCIUM 9.5   < > 8.8* 8.7* 8.9  --  8.8*  PROT 6.7  --  5.7*  --  5.9*  --   --   BILITOT 0.7  --  0.9  --  0.7  --   --   ALKPHOS 62  --  50  --  67  --   --   ALT 26  --  21  --  23  --   --   AST 31  --  29  --  33  --   --   GLUCOSE 100*   < > 121* 98 108* 104* 94   < > = values in this interval not displayed.     Imaging/Diagnostic Tests: Dg Chest Port 1 View  Result Date: 06/24/2018 CLINICAL DATA:  67 year old male with sudden onset of transient chest pain. Recently discharged from the hospital yesterday. EXAM: PORTABLE CHEST 1 VIEW COMPARISON:  Prior chest x-ray 06/21/2017 FINDINGS: Stable borderline cardiomegaly. The mediastinal contours are unchanged. Atherosclerotic  calcifications again noted in the transverse aorta. No evidence of pneumothorax, new pulmonary edema, airspace opacity or pleural effusion. Stable trace right pleural effusion versus pleural thickening in the periphery of the lung. Chronic bronchitic changes and interstitial prominence are similar compared to prior. Remote healed right-sided rib fractures again noted. 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 IMPRESSION: Stable chest x-ray without evidence of acute cardiopulmonary process. Electronically Signed   By: Jacqulynn Cadet M.D.   On: 06/24/2018 11:04    Patriciaann Clan, DO 06/25/2018, 6:24 AM PGY-1, [place] Intern pager: [phone], text pages welcome

## 2018-06-25 NOTE — Progress Notes (Addendum)
Late progress note. Spoke with GI on call on 9/01 in evening after cardiology requested input given his recent mallory-weiss tear.  GI recommended anticoagulation as needed per cardiology. Patient has clip in place, and per GI we could manage bleeding as needed.

## 2018-06-25 NOTE — Progress Notes (Signed)
Progress Note  Patient Name: Lucas [name] Date of Encounter: 06/25/2018  Primary Cardiologist: Fransico Him   Subjective   Still with constant pressure in epigastric area and cough with dyspnea   Inpatient Medications    Scheduled Meds: . folic acid  1 mg Oral Daily  . loratadine  10 mg Oral Daily  . LORazepam  0-4 mg Oral Q6H   Followed by  . [START ON 06/26/2018] LORazepam  0-4 mg Oral Q12H  . metoprolol tartrate  12.5 mg Oral BID  . montelukast  10 mg Oral QHS  . multivitamin with minerals  1 tablet Oral Daily  . pantoprazole  40 mg Oral BID  . rosuvastatin  20 mg Oral Daily  . sucralfate  1 g Oral QID  . thiamine  100 mg Oral Daily   Continuous Infusions: . nitroGLYCERIN 5 mcg/min (06/25/18 0040)   PRN Meds: albuterol, guaiFENesin-dextromethorphan, LORazepam **OR** LORazepam   Vital Signs    Vitals:   06/24/18 2357 06/25/18 0035 06/25/18 0317 06/25/18 0319  BP: 137/71 134/75 (!) 144/80   Pulse: 65 89 70   Resp: 15  19   Temp: 97.7 F (36.5 C)  97.8 F (36.6 C)   TempSrc: Oral  Oral   SpO2: 100%  100%   Weight:    71.2 kg  Height:        Intake/Output Summary (Last 24 hours) at 06/25/2018 0836 Last data filed at 06/25/2018 0650 Gross per 24 hour  Intake 1228.8 ml  Output 175 ml  Net 1053.8 ml   Filed Weights   06/24/18 1055 06/24/18 1535 06/25/18 0319  Weight: 68.7 kg 68.7 kg 71.2 kg    Telemetry    NSR 06/25/2018  - Personally Reviewed  ECG    NSR normal  - Personally Reviewed  Physical Exam  Chronically ill black male  GEN: No acute distress.   Neck: No JVD Cardiac: RRR, no murmurs, rubs, or gallops.  Respiratory: rhonchi no active wheezing  GI: Soft, nontender, non-distended  MS: No edema; No deformity. Neuro:  Nonfocal  Psych: Normal affect   Labs    Chemistry Recent Labs  Lab 06/21/18 0237  06/22/18 0243 06/23/18 0626 06/24/18 1034 06/24/18 1056 06/25/18 0308  NA 134*   < > 136 138 144 143 142  K 3.5   < > 4.6 4.0 3.6 3.6  3.5  CL 94*   < > 104 108 111 108 112*  CO2 25   < > 28 23 26   --  25  GLUCOSE 100*   < > 121* 98 108* 104* 94  BUN 31*   < > 14 5* 5* 4* 6*  CREATININE 0.96   < > 0.87 0.86 0.98 1.00 0.88  CALCIUM 9.5   < > 8.8* 8.7* 8.9  --  8.8*  PROT 6.7  --  5.7*  --  5.9*  --   --   ALBUMIN 3.7  --  3.2*  --  3.3*  --   --   AST 31  --  29  --  33  --   --   ALT 26  --  21  --  23  --   --   ALKPHOS 62  --  50  --  67  --   --   BILITOT 0.7  --  0.9  --  0.7  --   --   GFRNONAA >60   < > >60 >60 >60  --  >  60  GFRAA >60   < > >60 >60 >60  --  >60  ANIONGAP 15   < > 4* 7 7  --  5   < > = values in this interval not displayed.     Hematology Recent Labs  Lab 06/23/18 0626 06/24/18 1034 06/24/18 1056 06/25/18 0308  WBC 4.0 4.6  --  4.5  RBC 2.40* 2.51*  --  2.35*  HGB 8.0* 8.3* 8.2* 7.6*  HCT 23.9* 25.2* 24.0* 23.4*  MCV 99.6 100.4*  --  99.6  MCH 33.3 33.1  --  32.3  MCHC 33.5 32.9  --  32.5  RDW 12.8 12.8  --  13.4  PLT 177 195  --  190    Cardiac Enzymes Recent Labs  Lab 06/22/18 1317 06/24/18 1600 06/24/18 2144 06/25/18 0308  TROPONINI <0.03 0.08* 0.07* 0.06*    Recent Labs  Lab 06/24/18 1054  TROPIPOC 0.01     BNPNo results for input(s): BNP, PROBNP in the last 168 hours.   DDimer No results for input(s): DDIMER in the last 168 hours.   Radiology    Dg Chest Port 1 View  Result Date: 06/24/2018 CLINICAL DATA:  67 year old male with sudden onset of transient chest pain. Recently discharged from the hospital yesterday. EXAM: PORTABLE CHEST 1 VIEW COMPARISON:  Prior chest x-ray 06/21/2017 FINDINGS: Stable borderline cardiomegaly. The mediastinal contours are unchanged. Atherosclerotic calcifications again noted in the transverse aorta. No evidence of pneumothorax, new pulmonary edema, airspace opacity or pleural effusion. Stable trace right pleural effusion versus pleural thickening in the periphery of the lung. Chronic bronchitic changes and interstitial prominence are  similar compared to prior. Remote healed right-sided rib fractures again noted. 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 IMPRESSION: Stable chest x-ray without evidence of acute cardiopulmonary process. Electronically Signed   By: Jacqulynn Cadet M.D.   On: 06/24/2018 11:04    Cardiac Studies   TTE pending   Patient Profile     67 y.o. male with HTN, HLD smoker with recent Mallory Weiss tear on EGD d/c 06/23/18 admitted with coughing and chest pain with minimal troponin elevation and normal ECG  Assessment & Plan    Chest Pain:  This appears to be residual from his GI issues and Mallory Weiss tear. D/c IV nitro TTE pending He has already eaten breakfast today and TTE pending will order lexiscan myovue for am  GI:  On pantaprazole and carafate   Cough:  CXR NAD ? Bronchitis/ allergies does seem bronchitic   HLD  Continue statin       For questions or updates, please contact [place] Please consult [email or web address] for contact info under Cardiology/STEMI.      Signed, Jenkins Rouge, MD  06/25/2018, 8:36 AM

## 2018-06-25 NOTE — Progress Notes (Signed)
1 unit of blood given without any adverse reaction.  Idolina Primer, RN

## 2018-06-26 ENCOUNTER — Encounter (HOSPITAL_COMMUNITY): Payer: Self-pay | Admitting: Family Medicine

## 2018-06-26 ENCOUNTER — Inpatient Hospital Stay (HOSPITAL_COMMUNITY): Payer: Medicare HMO

## 2018-06-26 DIAGNOSIS — R079 Chest pain, unspecified: Secondary | ICD-10-CM

## 2018-06-26 DIAGNOSIS — S82201A Unspecified fracture of shaft of right tibia, initial encounter for closed fracture: Secondary | ICD-10-CM

## 2018-06-26 DIAGNOSIS — I340 Nonrheumatic mitral (valve) insufficiency: Secondary | ICD-10-CM

## 2018-06-26 DIAGNOSIS — R931 Abnormal findings on diagnostic imaging of heart and coronary circulation: Secondary | ICD-10-CM

## 2018-06-26 DIAGNOSIS — K269 Duodenal ulcer, unspecified as acute or chronic, without hemorrhage or perforation: Secondary | ICD-10-CM

## 2018-06-26 DIAGNOSIS — B9681 Helicobacter pylori [H. pylori] as the cause of diseases classified elsewhere: Secondary | ICD-10-CM | POA: Diagnosis present

## 2018-06-26 HISTORY — DX: Unspecified fracture of shaft of right tibia, initial encounter for closed fracture: S82.201A

## 2018-06-26 LAB — CBC
HCT: 26 % — ABNORMAL LOW (ref 39.0–52.0)
HEMOGLOBIN: 8.9 g/dL — AB (ref 13.0–17.0)
MCH: 32.6 pg (ref 26.0–34.0)
MCHC: 34.2 g/dL (ref 30.0–36.0)
MCV: 95.2 fL (ref 78.0–100.0)
Platelets: 187 10*3/uL (ref 150–400)
RBC: 2.73 MIL/uL — AB (ref 4.22–5.81)
RDW: 14.2 % (ref 11.5–15.5)
WBC: 6 10*3/uL (ref 4.0–10.5)

## 2018-06-26 LAB — BASIC METABOLIC PANEL
Anion gap: 6 (ref 5–15)
CHLORIDE: 107 mmol/L (ref 98–111)
CO2: 27 mmol/L (ref 22–32)
Calcium: 9 mg/dL (ref 8.9–10.3)
Creatinine, Ser: 0.9 mg/dL (ref 0.61–1.24)
GFR calc Af Amer: >60 mL/min (ref >60)
GFR calc non Af Amer: >60 mL/min (ref >60)
GLUCOSE: 94 mg/dL (ref 70–99)
Potassium: 3.3 mmol/L — ABNORMAL LOW (ref 3.5–5.1)
Sodium: 140 mmol/L (ref 135–145)

## 2018-06-26 LAB — BPAM RBC
Blood Product Expiration Date: 201909282359
ISSUE DATE / TIME: 201909021331
Unit Type and Rh: 5100

## 2018-06-26 LAB — NM MYOCAR MULTI W/SPECT W/WALL MOTION / EF
MPHR: 152 {beats}/min
Peak HR: 122 {beats}/min
Percent HR: 80 %
Rest HR: 58 {beats}/min

## 2018-06-26 LAB — TYPE AND SCREEN
ABO/RH(D): O POS
Antibody Screen: NEGATIVE
Unit division: 0

## 2018-06-26 LAB — ECHOCARDIOGRAM COMPLETE
HEIGHTINCHES: 71 in
WEIGHTICAEL: 2544 [oz_av]

## 2018-06-26 LAB — HEPARIN LEVEL (UNFRACTIONATED): Heparin Unfractionated: <0.1 IU/mL — ABNORMAL LOW (ref 0.30–0.70)

## 2018-06-26 MED ORDER — SODIUM CHLORIDE 0.9 % IV SOLN: 250.0000 mL | INTRAVENOUS | Status: DC | PRN

## 2018-06-26 MED ORDER — DEXTROSE 5 % IV SOLN: 3.0000 mg | Freq: Once | INTRAVENOUS | Status: DC

## 2018-06-26 MED ORDER — REGADENOSON 0.4 MG/5ML IV SOLN
0.4000 mg | Freq: Once | INTRAVENOUS | Status: AC
  Administered 2018-06-26: 0.4 mg via INTRAVENOUS
  Filled 2018-06-26: qty 5

## 2018-06-26 MED ORDER — HEPARIN (PORCINE) IN NACL 100-0.45 UNIT/ML-% IJ SOLN
950.0000 [IU]/h | INTRAMUSCULAR | Status: DC
  Administered 2018-06-26: 750 [IU]/h via INTRAVENOUS
  Filled 2018-06-26: qty 250

## 2018-06-26 MED ORDER — METOPROLOL TARTRATE 25 MG PO TABS
25.0000 mg | ORAL_TABLET | Freq: Two times a day (BID) | ORAL | Status: DC
  Administered 2018-06-26 – 2018-06-28 (×5): 25 mg via ORAL
  Filled 2018-06-26 (×5): qty 1

## 2018-06-26 MED ORDER — SODIUM CHLORIDE 0.9% FLUSH
3.0000 mL | Freq: Two times a day (BID) | INTRAVENOUS | Status: DC
  Administered 2018-06-26: 3 mL via INTRAVENOUS

## 2018-06-26 MED ORDER — SODIUM CHLORIDE 0.9 % WEIGHT BASED INFUSION: 1.0000 mL/kg/h | INTRAVENOUS | Status: DC

## 2018-06-26 MED ORDER — ASPIRIN 81 MG PO CHEW
81.0000 mg | CHEWABLE_TABLET | ORAL | Status: AC
  Administered 2018-06-27: 81 mg via ORAL
  Filled 2018-06-26: qty 1

## 2018-06-26 MED ORDER — BUDESONIDE 0.25 MG/2ML IN SUSP
0.2500 mg | Freq: Two times a day (BID) | RESPIRATORY_TRACT | Status: DC
  Administered 2018-06-26 – 2018-06-28 (×4): 0.25 mg via RESPIRATORY_TRACT
  Filled 2018-06-26 (×4): qty 2

## 2018-06-26 MED ORDER — NITROGLYCERIN 0.4 MG SL SUBL
0.4000 mg | SUBLINGUAL_TABLET | SUBLINGUAL | Status: DC | PRN
  Administered 2018-06-26: 0.4 mg via SUBLINGUAL

## 2018-06-26 MED ORDER — AMINOPHYLLINE 25 MG/ML IV SOLN
INTRAVENOUS | Status: AC
  Filled 2018-06-26: qty 10

## 2018-06-26 MED ORDER — TECHNETIUM TC 99M TETROFOSMIN IV KIT
9.7000 | PACK | Freq: Once | INTRAVENOUS | Status: AC | PRN
  Administered 2018-06-26: 9.7 via INTRAVENOUS

## 2018-06-26 MED ORDER — SODIUM CHLORIDE 0.9 % WEIGHT BASED INFUSION: 3.0000 mL/kg/h | INTRAVENOUS | Status: DC

## 2018-06-26 MED ORDER — SODIUM CHLORIDE 0.9% FLUSH: 3.0000 mL | INTRAVENOUS | Status: DC | PRN

## 2018-06-26 MED ORDER — NITROGLYCERIN 0.4 MG SL SUBL
SUBLINGUAL_TABLET | SUBLINGUAL | Status: AC
  Administered 2018-06-26: 0.4 mg via SUBLINGUAL
  Filled 2018-06-26: qty 1

## 2018-06-26 MED ORDER — REGADENOSON 0.4 MG/5ML IV SOLN
INTRAVENOUS | Status: AC
  Filled 2018-06-26: qty 5

## 2018-06-26 MED ORDER — TECHNETIUM TC 99M TETROFOSMIN IV KIT
29.2000 | PACK | Freq: Once | INTRAVENOUS | Status: AC | PRN
  Administered 2018-06-26: 29.2 via INTRAVENOUS

## 2018-06-26 MED ORDER — POTASSIUM CHLORIDE CRYS ER 20 MEQ PO TBCR
40.0000 meq | EXTENDED_RELEASE_TABLET | Freq: Two times a day (BID) | ORAL | Status: AC
  Administered 2018-06-26 (×2): 40 meq via ORAL
  Filled 2018-06-26 (×2): qty 2

## 2018-06-26 NOTE — Progress Notes (Signed)
Administered Lexiscan to pt. Near end of test, pt complained of chest pain 8 on a scale of 0-10. Per PA, administered 0.4 nitroglycerin SL. Pt now reports that the pain has subsided completely and reports a zero

## 2018-06-26 NOTE — Progress Notes (Signed)
Family Medicine Teaching Service Daily Progress Note Intern Pager: [phone]  Patient name: Lucas [name] Medical record number: [id number] Date of birth: Jan 24, 1951 Age: 67 y.o. Gender: male  Primary Care Provider: Steve Rattler, DO Consultants: Cards Code Status: Full   Assessment and Plan: [name] a 67 y.o.malepresenting with acute onset chest pain associated with dyspnea, diaphoresis, and paresthesias. PMH is significant forCOPD, HTN, daily alcohol use, tobacco use, chronic hyponatremia, and HLD.  Acute chest pain:No CP since 9/1, however does note new dyspnea on exertion after having initial CP episode leading to admission. No SOB at rest. [name] for [facility] this am, reported CP and tingling 5 minutes into procedure, further report pending. He will now be going for a cardic cath tomorrow 9/4. EKG yesterday NSR without ischemic changes, Echo pending.  Continue to have high suspicion for cardiac origin, especially given today's myoview.  -Cardiology following; appreciate recs, Cath 9/4 -Echo pending -nitro PRN  -Cont lopressor 12.5 BID and home statin, aspirin -Tx anemia and COPD as below -Discuss with cards for utilization of heparin, cleared from GI for this    Mallory weiss tear s/p hemostatic clip placement:Recent hospitalization for hematemesis, found to have mallory weiss tear on EGD 8/30 with hemostatic clip placement and epi injection. Denies any further episodes of hematemesis, had a non-bloody BM yesterday. since EGD. S/p 1 pRBCs yesterday, Hgb 8.9 from 7.6 yesterday.  -Cont Protonix 40mg  BID >>after 8 weeks titrate down to maintain control of GERD  -Cont sucralfate suspension 1g PO QID x2 weeks (started 8/31-)  -Surgical pathology from EGD showing gastric H pylori, will start antibiotic therapy following cardiac workup, likely tomorrow     COPD:Dry cough improving, less frequent, feels SOB afterwards. On RA today. Dry coughing could be potentially  related to allergies vs acid reflux vs "smoker's" cough. Has been using albuterol daily at home.  -Cont LABA +LAMA (Anoro), add ICS today   -Cont home Singulair -Loratadine 10mg  daily (in place of home cetirizine)  -Cont flonase   -Albuterol q4 hours PRN  -Tessalon 100mg  TID for cough; robitussin as needed   Borderline prolonged STM:[id number].  479 on EKG at admit, now 411. Mg wnl.  -Avoid QT prolonging agents  Hypertension:Stable, normotensive overnight. Discontinued to his Candesartan-HCTZ on discharge 8/31 for consideration of restarting outpatient because he remained normotensive without this on board for last admission.  -Cont to monitor BP  -Cont metoprolol   Alcoholuse disorder:Endorses drinking 4-5 16oz beers daily (previously reported 3-4 on last admission), with occasional liquor. Denies any previous hospitalizations for withdrawal.  -CIWA protocol with ativan: 0 overnight  -Thiamine, folate, and multivitamin -Outpatient DEXA  -F/u vitamin D   Macrocytic anemia: Stable.  Hgb 8.9, 7.6 on admit. Recent decrease due to blood loss with acute GI bleed.  -S/p transfusion 1 pRBCs 9/2 -Monitor CBC  Chronic Hyponatremia:Stable, improved.  Na 142.  -Monitor BMP  Tobacco use:1 PPD (reported 1/2 PPD previously). Did not require a nicotine patch for recent admission.  -Nicotine patchas needed -Discuss smoking cessation, pt seemed somewhat interested in Chantix   Allergies: On zyrtec and singulair at home - Continue singulair and claritin (on formulary) -Started Flonase 9/2  Hyperlipidemia:Last lipid panel in 2017, LDL 120. Takes rosuvastatin 20mg  at home.  -Cont home rosuvastatin  FEN/GI: Heart healthy diet  PPX: SCDs  Disposition: Continued care, myoview today   Subjective:  No acute events overnight, pt doing well this am. Cough less frequent. Does note dyspnea on exertion with  walking to the bathroom, previously could walk 1 mile with only mild  shortness of breath per pt. No CP since 9/1. Had a non-bloody BM yesterday, denies dark stool.   Objective: Temp:  [97.7 F (36.5 C)-98.3 F (36.8 C)] 98.1 F (36.7 C) (09/02 1951) Pulse Rate:  [63-81] 68 (09/03 0309) Resp:  [16-20] 16 (09/02 2000) BP: (114-156)/(66-80) 136/78 (09/02 1951) SpO2:  [99 %-100 %] 100 % (09/02 2000) Physical Exam: General: Alert, NAD  HEENT: NCAT, MMM Cardiac: RRR no m/g/r Lungs: Generally clear, diminished breath sounds throughout, no increased WOB, on RA  Abdomen: soft, non-tender, non-distended, normoactive BS Msk: Moves all extremities spontaneously  Ext: Warm, dry, 2+ distal pulses, no edema   Laboratory: Recent Labs  Lab 06/24/18 1034  06/25/18 0308 06/25/18 2204 06/26/18 0529  WBC 4.6  --  4.5  --  6.0  HGB 8.3*   < > 7.6* 9.3* 8.9*  HCT 25.2*   < > 23.4* 27.9* 26.0*  PLT 195  --  190  --  187   < > = values in this interval not displayed.   Recent Labs  Lab 06/21/18 0237  06/22/18 0243 06/23/18 0626 06/24/18 1034 06/24/18 1056 06/25/18 0308  NA 134*   < > 136 138 144 143 142  K 3.5   < > 4.6 4.0 3.6 3.6 3.5  CL 94*   < > 104 108 111 108 112*  CO2 25   < > 28 23 26   --  25  BUN 31*   < > 14 5* 5* 4* 6*  CREATININE 0.96   < > 0.87 0.86 0.98 1.00 0.88  CALCIUM 9.5   < > 8.8* 8.7* 8.9  --  8.8*  PROT 6.7  --  5.7*  --  5.9*  --   --   BILITOT 0.7  --  0.9  --  0.7  --   --   ALKPHOS 62  --  50  --  67  --   --   ALT 26  --  21  --  23  --   --   AST 31  --  29  --  33  --   --   GLUCOSE 100*   < > 121* 98 108* 104* 94   < > = values in this interval not displayed.    Imaging/Diagnostic Tests: No results found.  Patriciaann Clan, DO 06/26/2018, 6:25 AM PGY-1, [place] Intern pager: [phone], text pages welcome

## 2018-06-26 NOTE — Progress Notes (Signed)
ANTICOAGULATION CONSULT NOTE - Initial Consult  Pharmacy Consult for Heparin (no bolus) Indication:  severe chest pain in stress test  Allergies  Allergen Reactions  . Ace Inhibitors Swelling  . Ampicillin Nausea And Vomiting  . Penicillins Nausea And Vomiting  . Codeine Nausea And Vomiting  . Lisinopril Swelling    REACTION: angioedema    Patient Measurements: Height: 5\' 11"  (180.3 cm) Weight: 159 lb (72.1 kg) IBW/kg (Calculated) : 75.3 Heparin Dosing Weight: 72.1 kg  Vital Signs: Temp: 98.2 F (36.8 C) (09/03 2039) Temp Source: Oral (09/03 2039) BP: 137/70 (09/03 2039) Pulse Rate: 84 (09/03 2039)  Labs: Recent Labs    06/24/18 1034 06/24/18 1056 06/24/18 1600 06/24/18 2144 06/25/18 0308 06/25/18 2204 06/26/18 0529 06/26/18 1909  HGB 8.3* 8.2*  --   --  7.6* 9.3* 8.9*  --   HCT 25.2* 24.0*  --   --  23.4* 27.9* 26.0*  --   PLT 195  --   --   --  190  --  187  --   LABPROT 13.1  --   --   --   --   --   --   --   INR 1.00  --   --   --   --   --   --   --   HEPARINUNFRC  --   --   --   --   --   --   --  <0.10*  CREATININE 0.98 1.00  --   --  0.88  --  0.90  --   TROPONINI  --   --  0.08* 0.07* 0.06*  --   --   --     Estimated Creatinine Clearance: 80.1 mL/min (by C-G formula based on SCr of 0.9 mg/dL).   Medical History: Past Medical History:  Diagnosis Date  . Acute blood loss anemia 06/21/2018  . Acute upper GI bleed 06/21/2018  . Allergy   . Bilateral AVN of femurs ([place]) 06/25/2018   See 11/2017 Chest CT report  . COPD 12/21/2006  . Environmental allergies   . History of femur fracture 06/25/2018   11/2017 CT AP showed Remote nonunited right femur greater trochanter fracture  . History of Fracture of right tibia 06/26/2018  . History of multiple right thoacic rib fractures 06/25/2018  . History of Right clavicular fracture 06/25/2018  . HYPERLIPIDEMIA 12/21/2006  . HYPERTENSION, BENIGN SYSTEMIC 12/21/2006  . Mallory-Weiss tear   . TOBACCO DEPENDENCE  12/21/2006    Medications:  Medications Prior to Admission  Medication Sig Dispense Refill Last Dose  . albuterol (PROVENTIL HFA;VENTOLIN HFA) 108 (90 Base) MCG/ACT inhaler INHALE 2 PUFFS INTO THE LUNGS EVERY 4 HOURS AS NEEDED FOR WHEEZING OR SHORTNESS OF BREATH 18 g 6 06/24/2018 at Unknown time  . cetirizine (ZYRTEC) 10 MG tablet Take 1 tablet (10 mg total) by mouth daily. 30 tablet 11 Past Week at Unknown time  . montelukast (SINGULAIR) 10 MG tablet Take 1 tablet (10 mg total) by mouth at bedtime. 30 tablet 3 Past Week at Unknown time  . rosuvastatin (CRESTOR) 20 MG tablet Take 1 tablet (20 mg total) by mouth daily. 90 tablet 3 Past Week at Unknown time  . pantoprazole (PROTONIX) 40 MG tablet Take 1 tablet (40 mg total) by mouth 2 (two) times daily. 108 tablet 0 not yet  . sucralfate (CARAFATE) 1 GM/10ML suspension Take 10 mLs (1 g total) by mouth 4 (four) times daily for 13 days. 420 mL  0   . umeclidinium bromide (INCRUSE ELLIPTA) 62.5 MCG/INH AEPB Inhale 1 puff into the lungs daily. 90 each 3 not yet   Scheduled:  . aminophylline      . [START ON 06/27/2018] aspirin  81 mg Oral Pre-Cath  . aspirin EC  81 mg Oral Daily  . benzonatate  100 mg Oral TID  . budesonide  0.25 mg Nebulization BID  . fluticasone  2 spray Each Nare Daily  . folic acid  1 mg Oral Daily  . loratadine  10 mg Oral Daily  . LORazepam  0-4 mg Oral Q12H  . metoprolol tartrate  25 mg Oral BID  . montelukast  10 mg Oral QHS  . multivitamin with minerals  1 tablet Oral Daily  . pantoprazole  40 mg Oral BID  . potassium chloride  40 mEq Oral BID  . rosuvastatin  20 mg Oral Daily  . sodium chloride flush  3 mL Intravenous Q12H  . sucralfate  1 g Oral QID  . thiamine  100 mg Oral Daily  . umeclidinium-vilanterol  1 puff Inhalation Daily    Assessment: 67 y.o male admitted 06/25/18 for chest pain/sudden onset.  During nuclear stress test today, patient had severe chest pain. Pharmacy consulted to start IV heparin (no  bolus).  Patient was not on  anticoagulation PTA.   Hgb 9.8 on admit, now 8.9 PLTC wnl.   Recent hospitalization for hematemesis and melena , discharged 06/23/18. EGD on 8/30 showing Mallory-Weiss tear.  He was discharged on PPI x8weeks, sucralfate x2weeks.  I will target lower therapeutic heparin level goal due to recent GIB, mallory weis tear.  Heparin level is undetectable on 750 units/hr. No interruption with infusion.  Goal of Therapy:  Heparin level 0.3-0.5 units/ml  (due to recent GIB,  mallory weis tear) Monitor platelets by anticoagulation protocol: Yes   Plan:  Increase Heparin drip to 950 units/hr. Heparin level in 6 hours in AM Daily heparin level and CBC while on iv heparin drip.   Thank you for allowing pharmacy to be part of this patients care team.  Maryanna Shape, PharmD, BCPS, BCPPS Clinical Pharmacist  Pager: [phone]   06/26/2018,9:06 PM

## 2018-06-26 NOTE — Progress Notes (Signed)
  Echocardiogram 2D Echocardiogram has been performed.  Jennette Dubin 06/26/2018, 3:47 PM

## 2018-06-26 NOTE — Discharge Summary (Signed)
[facility] Discharge Summary  Patient name: Lucas [name] Medical record number: [id number] Date of birth: 04-Jul-1951 Age: 67 y.o. Gender: male Date of Admission: 06/24/2018  Date of Discharge: 06/28/2018  Admitting Physician: Blane Ohara McDiarmid, MD  Primary Care Provider: Steve Rattler, DO Consultants: Cards  Indication for Hospitalization: Chest Pain  Discharge Diagnoses/Problem List:  CAD- cath 9/4 with 3 vessel disease-medically managing  Mallory weiss tear s/p hemostatic clip placement on 8/30  Pulmonary hypertension 36 mmHg  COPD Hypertension Alcohol use disorder Tobacco use  Chronic hyponatremia Hyperlipidemia  Seasonal allergies   Disposition: To home   Discharge Condition: Stable   Discharge Exam:  General: Alert, NAD HEENT: NCAT, MMM Cardiac: RRR no m/g/r Lungs: Clear bilaterally with slight inspiratory rhonchi, no increased WOB  Abdomen: soft, non-tender, non-distended, normoactive BS Msk: Moves all extremities spontaneously  Ext: Warm, dry, 2+ distal pulses, no edema    Brief Hospital Course:  Lucas [name] is a 67 y.o. male, with a past history of COPD, daily alcohol use, tobacco use, and recent upper GI via mallory weiss tear s/p hemostatic clip placement (8/30), that presented on 9/1 with acute substernal chest pressure relieved with nitro. Cardiology was consulted given inferolateral ST changes on EKG and elevated on troponin to 0.08. Troponin trended flat. Cardiology performed a stress test showing inferolateral ischemia, and subsequently performed a cardiac cath on 9/4 with 3 vessel disease (100% 2nd diagonal, 70% ostial to prox LCX, and 85% mid LCA). Recommended aggressive medical management, however could consider a RCA PCI in the future if continued episodes of chest pain. Felt chest pain could also be related to symptomatic anemia vs esophageal etiology given recent mallory weiss tear, received 1 U pRBCs during his stay. Hgb 8.5 on  9/5. During his stay, he remained hemodynamically stable and was without chest pain since 9/3 at discharge.   Issues for Follow Up:   1. Ensure follow up with cardiology in 1 month and monitor chest pain episodes. Medically managing CAD currently (aspirin, BB, imdur, statin)  2. Discussed tobacco cessation during hospitalization, patient expressed interest in starting chantix outpatient.  3. Patient will need DEXA as outpatient for alcohol-induced secondary osteoporosis.  4. Patient H. Pylori positive (following EGD results from previous admission for GI bleed), started treatment 9/5: Doxycycline 100 mg BID x 14 d, Bismuth 525 mg QID x 14 d, Flagyl 250 mg BID x 14 d, PPI (Protonix 40 mg or equivalent) x 14 d.   5. From recent hospitalization for UGI bleed: Continue BID PPI for 8 weeks total and sucrafate for 2 weeks total. Ensure F/u with GI.  6. Monitor Hgb, 8.5 at discharge. 7. Monitor BP: h/o HTN, stopped his Candesartan-HCTZ last admission due to normotensive. Continued to be normotensive this admit, and now has BB on.       Significant Procedures: Cardiac cath 9/4  Significant Labs and Imaging:  Recent Labs  Lab 06/26/18 0529 06/27/18 0300 06/27/18 2344  WBC 6.0 6.3 5.8  HGB 8.9* 8.8* 8.5*  HCT 26.0* 26.3* 25.2*  PLT 187 199 214   Recent Labs  Lab 06/25/18 0308 06/26/18 0529 06/27/18 0300 06/27/18 2344  NA 142 140 140 139  K 3.5 3.3* 3.8 3.5  CL 112* 107 110 109  CO2 25 27 23 24   GLUCOSE 94 94 89 107*  BUN 6* <5* 6* 5*  CREATININE 0.88 0.90 0.90 0.99  CALCIUM 8.8* 9.0 8.7* 8.6*  MG 2.1  --   --   --  Results/Tests Pending at Time of Discharge: None   Discharge Medications:  Allergies as of 06/28/2018      Reactions   Ace Inhibitors Swelling   Ampicillin Nausea And Vomiting   Penicillins Nausea And Vomiting   Codeine Nausea And Vomiting   Lisinopril Swelling   REACTION: angioedema      Medication List    STOP taking these medications   umeclidinium  bromide 62.5 MCG/INH Aepb Commonly known as:  INCRUSE ELLIPTA     TAKE these medications   albuterol 108 (90 Base) MCG/ACT inhaler Commonly known as:  PROVENTIL HFA;VENTOLIN HFA INHALE 2 PUFFS INTO THE LUNGS EVERY 4 HOURS AS NEEDED FOR WHEEZING OR SHORTNESS OF BREATH   aspirin 81 MG EC tablet Take 1 tablet (81 mg total) by mouth daily.   bismuth subsalicylate 818 MG chewable tablet Commonly known as:  PEPTO BISMOL Chew 2 tablets (524 mg total) by mouth 4 (four) times daily for 14 days.   cetirizine 10 MG tablet Commonly known as:  ZYRTEC Take 1 tablet (10 mg total) by mouth daily.   doxycycline 100 MG tablet Commonly known as:  VIBRA-TABS Take 1 tablet (100 mg total) by mouth 2 (two) times daily for 14 days.   fluticasone 50 MCG/ACT nasal spray Commonly known as:  FLONASE Place 2 sprays into both nostrils daily.   Fluticasone-Umeclidin-Vilant 100-62.5-25 MCG/INH Aepb Inhale 1 puff into the lungs daily for 28 days.   isosorbide mononitrate 30 MG 24 hr tablet Commonly known as:  IMDUR Take 1 tablet (30 mg total) by mouth daily.   metoprolol tartrate 25 MG tablet Commonly known as:  LOPRESSOR Take 1 tablet (25 mg total) by mouth 2 (two) times daily.   metroNIDAZOLE 250 MG tablet Commonly known as:  FLAGYL Take 1 tablet (250 mg total) by mouth 2 (two) times daily for 14 days.   montelukast 10 MG tablet Commonly known as:  SINGULAIR Take 1 tablet (10 mg total) by mouth at bedtime.   pantoprazole 40 MG tablet Commonly known as:  PROTONIX Take 1 tablet (40 mg total) by mouth 2 (two) times daily.   rosuvastatin 20 MG tablet Commonly known as:  CRESTOR Take 1 tablet (20 mg total) by mouth daily.   sucralfate 1 GM/10ML suspension Commonly known as:  CARAFATE Take 10 mLs (1 g total) by mouth 4 (four) times daily for 13 days.   Vitamin D3 400 units tablet Take 2 tablets (800 Units total) by mouth daily.       Discharge Instructions: Please refer to Patient  Instructions section of EMR for full details.  Patient was counseled important signs and symptoms that should prompt return to medical care, changes in medications, dietary instructions, activity restrictions, and follow up appointments.   Follow-Up Appointments: Follow-up Information    [place], Peter M, MD Follow up on 07/11/2018.   Specialty:  Cardiology Why:  at 11:00 AM with his PA  Fabian Sharp.  Contact information: [address] [place] [address] [phone]           Patriciaann Clan, DO 07/01/2018, 2:29 PM PGY-1, [place]

## 2018-06-26 NOTE — Progress Notes (Signed)
Progress Note  Patient Name: Lucas [name] Date of Encounter: 06/26/2018  Primary Cardiologist: No primary care provider on file.   Subjective   Went down for nuclear stress test this am and had significant CP during lexiscan myoview.  Currently pain free.   Inpatient Medications    Scheduled Meds: . aspirin EC  81 mg Oral Daily  . benzonatate  100 mg Oral TID  . fluticasone  2 spray Each Nare Daily  . folic acid  1 mg Oral Daily  . loratadine  10 mg Oral Daily  . LORazepam  0-4 mg Oral Q6H   Followed by  . LORazepam  0-4 mg Oral Q12H  . metoprolol tartrate  12.5 mg Oral BID  . montelukast  10 mg Oral QHS  . multivitamin with minerals  1 tablet Oral Daily  . pantoprazole  40 mg Oral BID  . rosuvastatin  20 mg Oral Daily  . sucralfate  1 g Oral QID  . thiamine  100 mg Oral Daily  . umeclidinium-vilanterol  1 puff Inhalation Daily   Continuous Infusions:  PRN Meds: albuterol, guaiFENesin-dextromethorphan, LORazepam **OR** LORazepam, polyethylene glycol, senna-docusate   Vital Signs    Vitals:   06/26/18 0309 06/26/18 0600 06/26/18 0642 06/26/18 0644  BP:    (!) 170/83  Pulse: 68  74 75  Resp:   (!) 27   Temp:   98.2 F (36.8 C)   TempSrc:   Oral   SpO2:   100%   Weight:  72.1 kg    Height:        Intake/Output Summary (Last 24 hours) at 06/26/2018 0830 Last data filed at 06/26/2018 0648 Gross per 24 hour  Intake 1169.67 ml  Output 1450 ml  Net -280.33 ml   Filed Weights   06/24/18 1535 06/25/18 0319 06/26/18 0600  Weight: 68.7 kg 71.2 kg 72.1 kg    Telemetry    NSR - Personally Reviewed  ECG    NSR with inferolateral ischemia - Personally Reviewed  Physical Exam   GEN: No acute distress.   Neck: No JVD Cardiac: RRR, no murmurs, rubs, or gallops.  Respiratory: Clear to auscultation bilaterally. GI: Soft, nontender, non-distended  MS: No edema; No deformity. Neuro:  Nonfocal  Psych: Normal affect   Labs    Chemistry Recent Labs  Lab  06/21/18 0237  06/22/18 0243  06/24/18 1034 06/24/18 1056 06/25/18 0308 06/26/18 0529  NA 134*   < > 136   < > 144 143 142 140  K 3.5   < > 4.6   < > 3.6 3.6 3.5 3.3*  CL 94*   < > 104   < > 111 108 112* 107  CO2 25   < > 28   < > 26  --  25 27  GLUCOSE 100*   < > 121*   < > 108* 104* 94 94  BUN 31*   < > 14   < > 5* 4* 6* <5*  CREATININE 0.96   < > 0.87   < > 0.98 1.00 0.88 0.90  CALCIUM 9.5   < > 8.8*   < > 8.9  --  8.8* 9.0  PROT 6.7  --  5.7*  --  5.9*  --   --   --   ALBUMIN 3.7  --  3.2*  --  3.3*  --   --   --   AST 31  --  29  --  33  --   --   --  ALT 26  --  21  --  23  --   --   --   ALKPHOS 62  --  50  --  67  --   --   --   BILITOT 0.7  --  0.9  --  0.7  --   --   --   GFRNONAA >60   < > >60   < > >60  --  >60 >60  GFRAA >60   < > >60   < > >60  --  >60 >60  ANIONGAP 15   < > 4*   < > 7  --  5 6   < > = values in this interval not displayed.     Hematology Recent Labs  Lab 06/24/18 1034  06/25/18 0308 06/25/18 2204 06/26/18 0529  WBC 4.6  --  4.5  --  6.0  RBC 2.51*  --  2.35*  --  2.73*  HGB 8.3*   < > 7.6* 9.3* 8.9*  HCT 25.2*   < > 23.4* 27.9* 26.0*  MCV 100.4*  --  99.6  --  95.2  MCH 33.1  --  32.3  --  32.6  MCHC 32.9  --  32.5  --  34.2  RDW 12.8  --  13.4  --  14.2  PLT 195  --  190  --  187   < > = values in this interval not displayed.    Cardiac Enzymes Recent Labs  Lab 06/22/18 1317 06/24/18 1600 06/24/18 2144 06/25/18 0308  TROPONINI <0.03 0.08* 0.07* 0.06*    Recent Labs  Lab 06/24/18 1054  TROPIPOC 0.01     BNPNo results for input(s): BNP, PROBNP in the last 168 hours.   DDimer No results for input(s): DDIMER in the last 168 hours.   Radiology    Dg Chest Port 1 View  Result Date: 06/24/2018 CLINICAL DATA:  67 year old male with sudden onset of transient chest pain. Recently discharged from the hospital yesterday. EXAM: PORTABLE CHEST 1 VIEW COMPARISON: Prior chest x-ray 06/21/2017 FINDINGS: Stable borderline  cardiomegaly. The mediastinal contours are unchanged. Atherosclerotic calcifications again noted in the transverse aorta. No evidence of pneumothorax, new pulmonary edema, airspace opacity or pleural effusion. Stable trace right pleural effusion versus pleural thickening in the periphery of the lung. Chronic bronchitic changes and interstitial prominence are similar compared to prior. Remote healed right-sided rib fractures again noted. 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 IMPRESSION: Stable chest x-ray without evidence of acute cardiopulmonary process. Electronically Signed   By: Jacqulynn Cadet M.D.   On: 06/24/2018 11:04 sudden onset of transient chest pain. Recently discharged from the hospital yesterday. EXAM: PORTABLE CHEST 1 VIEW COMPARISON:  Prior chest x-ray 06/21/2017 FINDINGS: Stable borderline  cardiomegaly. The mediastinal contours are unchanged. Atherosclerotic calcifications again noted in the transverse aorta. No evidence of pneumothorax, new pulmonary edema, airspace opacity or pleural effusion. Stable trace right pleural effusion versus pleural thickening in the periphery of the lung. Chronic bronchitic changes and interstitial prominence are similar compared to prior. Remote healed right-sided rib fractures again noted. 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 IMPRESSION: Stable chest x-ray without evidence of acute cardiopulmonary process. Electronically Signed   By: Jacqulynn Cadet M.D.   On: 06/24/2018 11:04    Cardiac Studies   2D echo pending   Patient Profile     67 y.o. male with HTN, HLD smoker with recent Mallory Weiss tear on EGD d/c 06/23/18 admitted with coughing and chest pain with minimal troponin elevation and normal ECG  Assessment & Plan    1.  Chest pain - likely GI in origin given his recent Mallory Weiss tear and fact that pain is identical to the pain he presented with a few days ago in setting of [employer] tear but his pain resolved with SL NTG and he now has new ST changes in the inferolateral leads with known significant coronary artery calcifications on Chest CT in 11/2017.  Minimally elevated trop with flat trend likely related to demand ischemia in the setting of significant anemia (0.08>0.07>0.06).  -continue and increase lopressor to 25mg   BID (BP elevated) and continue statin -echo pending -had significant CP today during his stress myoview.  Perfusion imaging showed small sized moderate ischemia in the anteroapical and anterior wall with inferior scar EF 54%.   -Discussed results with patient and recommend left heart cath tomorrow.  Note placed by IM service says that GI has cleared patient for any anticoagulation needed. -start IV Heparin gtt with no bolus -NPO after MN -cardiac cath in am -Cardiac catheterization  was discussed with the patient fully. The patient understands that risks include but are not limited to stroke (1 in 1000), death (1 in 79), kidney failure [usually temporary] (1 in 500), bleeding (1 in 200), allergic reaction [possibly serious] (1 in 200).  The patient understands and is willing to proceed.    2.  HTN -BP poorly controlled -increase lopressor to 25mg  BID  3.  Hyperlipidemia -continue statin -check FLP in am       For questions or updates, please contact [facility] Please consult [email or web address] for contact info under Cardiology/STEMI.      Signed, Fransico Him, MD  06/26/2018, 8:30 AM

## 2018-06-26 NOTE — Progress Notes (Addendum)
   Lucas [name] presented for a nuclear stress test today.  No immediate complications.  Stress imaging is pending at this time.  Preliminary EKG findings may be listed in the chart, but the stress test result will not be finalized until perfusion imaging is complete.   Patient reported tingling in b/l UE and LE around 4 minutes post infusion and crushing CP, rated 8/10, at 5 minutes into the [place]. STE was noted in V1, along with depression in leads V4/5/6 and inferior leads. Patient received SL nitro with minimal relief of pain, followed by complete reversal of symptoms with aminophylline.   The patient's EKG and symptoms were discussed with Dr. Percival Spanish (DOD), who recommends heart cath tomorrow 06/27/18. Will also discuss with Dr. Radford Pax.   He is not on heparin d/t recent GI complications.   Arvil Chaco, PA-C 06/26/2018, 10:26 AM

## 2018-06-26 NOTE — Progress Notes (Signed)
ANTICOAGULATION CONSULT NOTE - Initial Consult  Pharmacy Consult for Heparin (no bolus) Indication:  severe chest pain in stress test  Allergies  Allergen Reactions  . Ace Inhibitors Swelling  . Ampicillin Nausea And Vomiting  . Penicillins Nausea And Vomiting  . Codeine Nausea And Vomiting  . Lisinopril Swelling    REACTION: angioedema    Patient Measurements: Height: 5\' 11"  (180.3 cm) Weight: 159 lb (72.1 kg) IBW/kg (Calculated) : 75.3 Heparin Dosing Weight: 72.1 kg  Vital Signs: Temp: 98.2 F (36.8 C) (09/03 0642) Temp Source: Oral (09/03 0642) BP: 164/84 (09/03 1011) Pulse Rate: 75 (09/03 0644)  Labs: Recent Labs    06/24/18 1034 06/24/18 1056 06/24/18 1600 06/24/18 2144 06/25/18 0308 06/25/18 2204 06/26/18 0529  HGB 8.3* 8.2*  --   --  7.6* 9.3* 8.9*  HCT 25.2* 24.0*  --   --  23.4* 27.9* 26.0*  PLT 195  --   --   --  190  --  187  LABPROT 13.1  --   --   --   --   --   --   INR 1.00  --   --   --   --   --   --   CREATININE 0.98 1.00  --   --  0.88  --  0.90  TROPONINI  --   --  0.08* 0.07* 0.06*  --   --     Estimated Creatinine Clearance: 80.1 mL/min (by C-G formula based on SCr of 0.9 mg/dL).   Medical History: Past Medical History:  Diagnosis Date  . Acute blood loss anemia 06/21/2018  . Acute upper GI bleed 06/21/2018  . Allergy   . Bilateral AVN of femurs ([place]) 06/25/2018   See 11/2017 Chest CT report  . COPD 12/21/2006  . Environmental allergies   . History of femur fracture 06/25/2018   11/2017 CT AP showed Remote nonunited right femur greater trochanter fracture  . History of Fracture of right tibia 06/26/2018  . History of multiple right thoacic rib fractures 06/25/2018  . History of Right clavicular fracture 06/25/2018  . HYPERLIPIDEMIA 12/21/2006  . HYPERTENSION, BENIGN SYSTEMIC 12/21/2006  . Mallory-Weiss tear   . TOBACCO DEPENDENCE 12/21/2006    Medications:  Medications Prior to Admission  Medication Sig Dispense Refill Last Dose  .  albuterol (PROVENTIL HFA;VENTOLIN HFA) 108 (90 Base) MCG/ACT inhaler INHALE 2 PUFFS INTO THE LUNGS EVERY 4 HOURS AS NEEDED FOR WHEEZING OR SHORTNESS OF BREATH 18 g 6 06/24/2018 at Unknown time  . cetirizine (ZYRTEC) 10 MG tablet Take 1 tablet (10 mg total) by mouth daily. 30 tablet 11 Past Week at Unknown time  . montelukast (SINGULAIR) 10 MG tablet Take 1 tablet (10 mg total) by mouth at bedtime. 30 tablet 3 Past Week at Unknown time  . rosuvastatin (CRESTOR) 20 MG tablet Take 1 tablet (20 mg total) by mouth daily. 90 tablet 3 Past Week at Unknown time  . pantoprazole (PROTONIX) 40 MG tablet Take 1 tablet (40 mg total) by mouth 2 (two) times daily. 108 tablet 0 not yet  . sucralfate (CARAFATE) 1 GM/10ML suspension Take 10 mLs (1 g total) by mouth 4 (four) times daily for 13 days. 420 mL 0   . umeclidinium bromide (INCRUSE ELLIPTA) 62.5 MCG/INH AEPB Inhale 1 puff into the lungs daily. 90 each 3 not yet   Scheduled:  . aminophylline      . aspirin EC  81 mg Oral Daily  . benzonatate  100  mg Oral TID  . fluticasone  2 spray Each Nare Daily  . folic acid  1 mg Oral Daily  . loratadine  10 mg Oral Daily  . LORazepam  0-4 mg Oral Q6H   Followed by  . LORazepam  0-4 mg Oral Q12H  . metoprolol tartrate  25 mg Oral BID  . montelukast  10 mg Oral QHS  . multivitamin with minerals  1 tablet Oral Daily  . pantoprazole  40 mg Oral BID  . potassium chloride  40 mEq Oral BID  . rosuvastatin  20 mg Oral Daily  . sucralfate  1 g Oral QID  . thiamine  100 mg Oral Daily  . umeclidinium-vilanterol  1 puff Inhalation Daily    Assessment: 67 y.o male admitted 06/25/18 for chest pain/sudden onset.  During nuclear stress test today, patient had severe chest pain. Pharmacy consulted to start IV heparin (no bolus).  Patient was not on  anticoagulation PTA.   Hgb 9.8 on admit, now 8.9 PLTC wnl.   Recent hospitalization for hematemesis and melena , discharged 06/23/18. EGD on 8/30 showing Mallory-Weiss tear.  He was  discharged on PPI x8weeks, sucralfate x2weeks.  I will target lower therapeutic heparin level goal due to recent GIB, mallory weis tear.   Goal of Therapy:  Heparin level 0.3-0.5 units/ml  (due to recent GIB,  mallory weis tear) Monitor platelets by anticoagulation protocol: Yes   Plan:  Start IV Heparin drip 750 units/hr. No bolus per MD order, due to recent GIB,  mallory weis tear).  Heparin level in 6 hours Daily heparin level and CBC while on iv heparin drip.   Thank you for allowing pharmacy to be part of this patients care team.  Nicole Cella, RPh Clinical Pharmacist Pager: [phone] Please check AMION for all [facility] phone numbers After 10:00 PM, call [place] [phone] 06/26/2018,12:09 PM

## 2018-06-27 ENCOUNTER — Encounter (HOSPITAL_COMMUNITY)
Admission: EM | Disposition: A | Discharge status: Home / Self Care | Payer: Self-pay | Source: Home / Self Care | Attending: Family Medicine

## 2018-06-27 ENCOUNTER — Other Ambulatory Visit: Payer: Self-pay

## 2018-06-27 DIAGNOSIS — I2510 Atherosclerotic heart disease of native coronary artery without angina pectoris: Secondary | ICD-10-CM

## 2018-06-27 DIAGNOSIS — A048 Other specified bacterial intestinal infections: Secondary | ICD-10-CM

## 2018-06-27 HISTORY — PX: LEFT HEART CATH AND CORONARY ANGIOGRAPHY: CATH118249

## 2018-06-27 LAB — BASIC METABOLIC PANEL
Anion gap: 7 (ref 5–15)
BUN: 6 mg/dL — ABNORMAL LOW (ref 8–23)
CHLORIDE: 110 mmol/L (ref 98–111)
CO2: 23 mmol/L (ref 22–32)
Calcium: 8.7 mg/dL — ABNORMAL LOW (ref 8.9–10.3)
Creatinine, Ser: 0.9 mg/dL (ref 0.61–1.24)
GFR calc Af Amer: >60 mL/min (ref >60)
GFR calc non Af Amer: >60 mL/min (ref >60)
GLUCOSE: 89 mg/dL (ref 70–99)
POTASSIUM: 3.8 mmol/L (ref 3.5–5.1)
Sodium: 140 mmol/L (ref 135–145)

## 2018-06-27 LAB — CBC WITH DIFFERENTIAL/PLATELET
Abs Immature Granulocytes: 0 10*3/uL (ref 0.0–0.1)
Basophils Absolute: 0 10*3/uL (ref 0.0–0.1)
Basophils Relative: 1 %
EOS PCT: 5 %
Eosinophils Absolute: 0.3 10*3/uL (ref 0.0–0.7)
HEMATOCRIT: 26.3 % — AB (ref 39.0–52.0)
HEMOGLOBIN: 8.8 g/dL — AB (ref 13.0–17.0)
Immature Granulocytes: 0 %
LYMPHS ABS: 1.4 10*3/uL (ref 0.7–4.0)
LYMPHS PCT: 23 %
MCH: 32.6 pg (ref 26.0–34.0)
MCHC: 33.5 g/dL (ref 30.0–36.0)
MCV: 97.4 fL (ref 78.0–100.0)
Monocytes Absolute: 0.8 10*3/uL (ref 0.1–1.0)
Monocytes Relative: 13 %
Neutro Abs: 3.7 10*3/uL (ref 1.7–7.7)
Neutrophils Relative %: 58 %
Platelets: 199 10*3/uL (ref 150–400)
RBC: 2.7 MIL/uL — ABNORMAL LOW (ref 4.22–5.81)
RDW: 13.8 % (ref 11.5–15.5)
WBC: 6.3 10*3/uL (ref 4.0–10.5)

## 2018-06-27 LAB — VITAMIN D 25 HYDROXY (VIT D DEFICIENCY, FRACTURES): VIT D 25 HYDROXY: 26.3 ng/mL — AB (ref 30.0–100.0)

## 2018-06-27 LAB — HEPARIN LEVEL (UNFRACTIONATED): Heparin Unfractionated: 0.48 IU/mL (ref 0.30–0.70)

## 2018-06-27 SURGERY — LEFT HEART CATH AND CORONARY ANGIOGRAPHY
Anesthesia: LOCAL

## 2018-06-27 MED ORDER — LIDOCAINE HCL (PF) 1 % IJ SOLN
INTRAMUSCULAR | Status: DC | PRN
  Administered 2018-06-27: 1 mL
  Administered 2018-06-27: 2 mL

## 2018-06-27 MED ORDER — MIDAZOLAM HCL 2 MG/2ML IJ SOLN
INTRAMUSCULAR | Status: AC
  Filled 2018-06-27: qty 2

## 2018-06-27 MED ORDER — SODIUM CHLORIDE 0.9% FLUSH: 3.0000 mL | INTRAVENOUS | Status: DC | PRN

## 2018-06-27 MED ORDER — FENTANYL CITRATE (PF) 100 MCG/2ML IJ SOLN
INTRAMUSCULAR | Status: AC
  Filled 2018-06-27: qty 2

## 2018-06-27 MED ORDER — HEPARIN (PORCINE) IN NACL 1000-0.9 UT/500ML-% IV SOLN
INTRAVENOUS | Status: AC
  Filled 2018-06-27: qty 1000

## 2018-06-27 MED ORDER — METRONIDAZOLE 500 MG PO TABS
250.0000 mg | ORAL_TABLET | Freq: Two times a day (BID) | ORAL | Status: DC
  Administered 2018-06-28: 250 mg via ORAL
  Filled 2018-06-27: qty 1

## 2018-06-27 MED ORDER — FENTANYL CITRATE (PF) 100 MCG/2ML IJ SOLN
INTRAMUSCULAR | Status: DC | PRN
  Administered 2018-06-27: 25 ug via INTRAVENOUS

## 2018-06-27 MED ORDER — BISMUTH SUBSALICYLATE 262 MG PO CHEW: 524.0000 mg | CHEWABLE_TABLET | Freq: Four times a day (QID) | ORAL | Status: DC

## 2018-06-27 MED ORDER — HEPARIN SODIUM (PORCINE) 1000 UNIT/ML IJ SOLN
INTRAMUSCULAR | Status: AC
  Filled 2018-06-27: qty 1

## 2018-06-27 MED ORDER — METRONIDAZOLE 500 MG PO TABS: 250.0000 mg | ORAL_TABLET | Freq: Two times a day (BID) | ORAL | Status: DC

## 2018-06-27 MED ORDER — SODIUM CHLORIDE 0.9 % IV SOLN
250.0000 mL | INTRAVENOUS | Status: DC | PRN
  Administered 2018-06-28: 250 mL via INTRAVENOUS

## 2018-06-27 MED ORDER — CHOLECALCIFEROL 10 MCG (400 UNIT) PO TABS
800.0000 [IU] | ORAL_TABLET | Freq: Every day | ORAL | Status: DC
  Administered 2018-06-27 – 2018-06-28 (×2): 800 [IU] via ORAL
  Filled 2018-06-27 (×2): qty 2

## 2018-06-27 MED ORDER — MIDAZOLAM HCL 2 MG/2ML IJ SOLN
INTRAMUSCULAR | Status: DC | PRN
  Administered 2018-06-27: 1 mg via INTRAVENOUS

## 2018-06-27 MED ORDER — DOXYCYCLINE HYCLATE 100 MG PO TABS: 100.0000 mg | ORAL_TABLET | Freq: Two times a day (BID) | ORAL | Status: DC

## 2018-06-27 MED ORDER — VERAPAMIL HCL 2.5 MG/ML IV SOLN
INTRAVENOUS | Status: AC
  Filled 2018-06-27: qty 2

## 2018-06-27 MED ORDER — ENOXAPARIN SODIUM 40 MG/0.4ML ~~LOC~~ SOLN
40.0000 mg | SUBCUTANEOUS | Status: DC
  Administered 2018-06-28: 40 mg via SUBCUTANEOUS
  Filled 2018-06-27: qty 0.4

## 2018-06-27 MED ORDER — SODIUM CHLORIDE 0.9 % WEIGHT BASED INFUSION: 1.0000 mL/kg/h | INTRAVENOUS | Status: AC

## 2018-06-27 MED ORDER — LIDOCAINE HCL (PF) 1 % IJ SOLN
INTRAMUSCULAR | Status: AC
  Filled 2018-06-27: qty 30

## 2018-06-27 MED ORDER — HEPARIN (PORCINE) IN NACL 1000-0.9 UT/500ML-% IV SOLN
INTRAVENOUS | Status: DC | PRN
  Administered 2018-06-27 (×2): 500 mL

## 2018-06-27 MED ORDER — VERAPAMIL HCL 2.5 MG/ML IV SOLN
INTRAVENOUS | Status: DC | PRN
  Administered 2018-06-27: 10 mL via INTRA_ARTERIAL

## 2018-06-27 MED ORDER — HEPARIN SODIUM (PORCINE) 1000 UNIT/ML IJ SOLN
INTRAMUSCULAR | Status: DC | PRN
  Administered 2018-06-27: 3500 [IU] via INTRAVENOUS

## 2018-06-27 MED ORDER — IOHEXOL 350 MG/ML SOLN
INTRAVENOUS | Status: DC | PRN
  Administered 2018-06-27: 95 mL

## 2018-06-27 MED ORDER — ISOSORBIDE MONONITRATE ER 30 MG PO TB24
30.0000 mg | ORAL_TABLET | Freq: Every day | ORAL | Status: DC
  Administered 2018-06-27 – 2018-06-28 (×2): 30 mg via ORAL
  Filled 2018-06-27 (×2): qty 1

## 2018-06-27 MED ORDER — BISMUTH SUBSALICYLATE 262 MG PO CHEW
524.0000 mg | CHEWABLE_TABLET | Freq: Four times a day (QID) | ORAL | Status: DC
  Administered 2018-06-28 (×2): 524 mg via ORAL
  Filled 2018-06-27 (×4): qty 2

## 2018-06-27 MED ORDER — SODIUM CHLORIDE 0.9% FLUSH
3.0000 mL | Freq: Two times a day (BID) | INTRAVENOUS | Status: DC
  Administered 2018-06-28 (×2): 3 mL via INTRAVENOUS

## 2018-06-27 MED ORDER — DOXYCYCLINE HYCLATE 100 MG PO TABS
100.0000 mg | ORAL_TABLET | Freq: Two times a day (BID) | ORAL | Status: DC
  Administered 2018-06-28: 100 mg via ORAL
  Filled 2018-06-27: qty 1

## 2018-06-27 SURGICAL SUPPLY — 11 items
CATH IMPULSE 5F ANG/FL3.5 (CATHETERS) ×1 IMPLANT
DEVICE RAD COMP TR BAND LRG (VASCULAR PRODUCTS) ×1 IMPLANT
GLIDESHEATH SLEND SS 6F .021 (SHEATH) ×2 IMPLANT
GUIDEWIRE INQWIRE 1.5J.035X260 (WIRE) ×1 IMPLANT
INQWIRE 1.5J .035X260CM (WIRE) ×2
KIT HEART LEFT (KITS) ×2 IMPLANT
PACK CARDIAC CATHETERIZATION (CUSTOM PROCEDURE TRAY) ×2 IMPLANT
SHEATH PROBE COVER 6X72 (BAG) ×1 IMPLANT
SYR MEDRAD MARK V 150ML (SYRINGE) ×2 IMPLANT
TRANSDUCER W/STOPCOCK (MISCELLANEOUS) ×2 IMPLANT
TUBING CIL FLEX 10 FLL-RA (TUBING) ×2 IMPLANT

## 2018-06-27 NOTE — Interval H&P Note (Signed)
History and Physical Interval Note:  06/27/2018 1:51 PM  Lucas [name]  has presented today for surgery, with the diagnosis of ua  The various methods of treatment have been discussed with the patient and family. After consideration of risks, benefits and other options for treatment, the patient has consented to  Procedure(s): LEFT HEART CATH AND CORONARY ANGIOGRAPHY (N/A) as a surgical intervention .  The patient's history has been reviewed, patient examined, no change in status, stable for surgery.  I have reviewed the patient's chart and labs.  Questions were answered to the patient's satisfaction.   Cath Lab Visit (complete for each Cath Lab visit)  Clinical Evaluation Leading to the Procedure:   ACS: Yes.    Non-ACS:    Anginal Classification: CCS III  Anti-ischemic medical therapy: Minimal Therapy (1 class of medications)  Non-Invasive Test Results: Intermediate-risk stress test findings: cardiac mortality 1-3%/year  Prior CABG: No previous CABG        Collier [facility] 06/27/2018 1:51 PM

## 2018-06-27 NOTE — Progress Notes (Signed)
[facility] for Heparin (no bolus) Indication:  severe chest pain in stress test  Allergies  Allergen Reactions  . Ace Inhibitors Swelling  . Ampicillin Nausea And Vomiting  . Penicillins Nausea And Vomiting  . Codeine Nausea And Vomiting  . Lisinopril Swelling    REACTION: angioedema    Patient Measurements: Height: 5\' 11"  (180.3 cm) Weight: 159 lb (72.1 kg) IBW/kg (Calculated) : 75.3 Heparin Dosing Weight: 72.1 kg  Vital Signs: Temp: 98.1 F (36.7 C) (09/04 0555) Temp Source: Oral (09/04 0555) BP: 140/84 (09/04 0555) Pulse Rate: 72 (09/04 0555)  Labs: Recent Labs    06/24/18 1034  06/24/18 1600 06/24/18 2144 06/25/18 0308 06/25/18 2204 06/26/18 0529 06/26/18 1909 06/27/18 0300  HGB 8.3*   < >  --   --  7.6* 9.3* 8.9*  --  8.8*  HCT 25.2*   < >  --   --  23.4* 27.9* 26.0*  --  26.3*  PLT 195  --   --   --  190  --  187  --  199  LABPROT 13.1  --   --   --   --   --   --   --   --   INR 1.00  --   --   --   --   --   --   --   --   HEPARINUNFRC  --   --   --   --   --   --   --  <0.10* 0.48  CREATININE 0.98   < >  --   --  0.88  --  0.90  --  0.90  TROPONINI  --   --  0.08* 0.07* 0.06*  --   --   --   --    < > = values in this interval not displayed.    Estimated Creatinine Clearance: 80.1 mL/min (by C-G formula based on SCr of 0.9 mg/dL).   Medical History: Past Medical History:  Diagnosis Date  . Acute blood loss anemia 06/21/2018  . Acute upper GI bleed 06/21/2018  . Allergy   . Bilateral AVN of femurs ([place]) 06/25/2018   See 11/2017 Chest CT report  . COPD 12/21/2006  . Environmental allergies   . History of femur fracture 06/25/2018   11/2017 CT AP showed Remote nonunited right femur greater trochanter fracture  . History of Fracture of right tibia 06/26/2018  . History of multiple right thoacic rib fractures 06/25/2018  . History of Right clavicular fracture 06/25/2018  . HYPERLIPIDEMIA 12/21/2006  . HYPERTENSION, BENIGN  SYSTEMIC 12/21/2006  . Mallory-Weiss tear   . TOBACCO DEPENDENCE 12/21/2006    Medications:  Medications Prior to Admission  Medication Sig Dispense Refill Last Dose  . albuterol (PROVENTIL HFA;VENTOLIN HFA) 108 (90 Base) MCG/ACT inhaler INHALE 2 PUFFS INTO THE LUNGS EVERY 4 HOURS AS NEEDED FOR WHEEZING OR SHORTNESS OF BREATH 18 g 6 06/24/2018 at Unknown time  . cetirizine (ZYRTEC) 10 MG tablet Take 1 tablet (10 mg total) by mouth daily. 30 tablet 11 Past Week at Unknown time  . montelukast (SINGULAIR) 10 MG tablet Take 1 tablet (10 mg total) by mouth at bedtime. 30 tablet 3 Past Week at Unknown time  . rosuvastatin (CRESTOR) 20 MG tablet Take 1 tablet (20 mg total) by mouth daily. 90 tablet 3 Past Week at Unknown time  . pantoprazole (PROTONIX) 40 MG tablet Take 1 tablet (40 mg total) by mouth 2 (  two) times daily. 108 tablet 0 not yet  . sucralfate (CARAFATE) 1 GM/10ML suspension Take 10 mLs (1 g total) by mouth 4 (four) times daily for 13 days. 420 mL 0   . umeclidinium bromide (INCRUSE ELLIPTA) 62.5 MCG/INH AEPB Inhale 1 puff into the lungs daily. 90 each 3 not yet   Scheduled:  . aspirin EC  81 mg Oral Daily  . benzonatate  100 mg Oral TID  . budesonide  0.25 mg Nebulization BID  . fluticasone  2 spray Each Nare Daily  . folic acid  1 mg Oral Daily  . loratadine  10 mg Oral Daily  . LORazepam  0-4 mg Oral Q12H  . metoprolol tartrate  25 mg Oral BID  . montelukast  10 mg Oral QHS  . multivitamin with minerals  1 tablet Oral Daily  . pantoprazole  40 mg Oral BID  . rosuvastatin  20 mg Oral Daily  . sodium chloride flush  3 mL Intravenous Q12H  . sucralfate  1 g Oral QID  . thiamine  100 mg Oral Daily  . umeclidinium-vilanterol  1 puff Inhalation Daily    Assessment: 67 y.o male on Heparin for severe chest pain during nuclear stress test. Patient was not on  anticoagulation PTA. Lower therapeutic heparin level goal due to recent GIB (8/31), mallory weis tear (8/30).   HL was 0.48  on 9/4. Plt wnl, and Hbg low but stable at 8.8 (8.9 on 9/3). No active bleeding documented.   Goal of Therapy:  Heparin level 0.3-0.5 units/ml Monitor platelets by anticoagulation protocol: Yes   Plan:  Maintain Heparin drip at 950 units/hr. f/u after cath scheduled at 1430 (9/4) Daily heparin level and CBC while on iv heparin drip.   Thank you for allowing pharmacy to be part of this patients care team.  Kerby Less, PharmD Student 3rd Year Pharmacy Student  06/27/2018,9:28 AM

## 2018-06-27 NOTE — Progress Notes (Signed)
Progress Note  Patient Name: Lucas [name] Date of Encounter: 06/27/2018  Primary Cardiologist: No primary care provider on file.   Subjective   Denies any further chest pain or shortness of breath Inpatient Medications    Scheduled Meds: . aspirin EC  81 mg Oral Daily  . benzonatate  100 mg Oral TID  . budesonide  0.25 mg Nebulization BID  . fluticasone  2 spray Each Nare Daily  . folic acid  1 mg Oral Daily  . loratadine  10 mg Oral Daily  . LORazepam  0-4 mg Oral Q12H  . metoprolol tartrate  25 mg Oral BID  . montelukast  10 mg Oral QHS  . multivitamin with minerals  1 tablet Oral Daily  . pantoprazole  40 mg Oral BID  . rosuvastatin  20 mg Oral Daily  . sodium chloride flush  3 mL Intravenous Q12H  . sucralfate  1 g Oral QID  . thiamine  100 mg Oral Daily  . umeclidinium-vilanterol  1 puff Inhalation Daily   Continuous Infusions: . sodium chloride    . sodium chloride 1 mL/kg/hr (06/27/18 0533)  . heparin 950 Units/hr (06/26/18 2120)   PRN Meds: sodium chloride, albuterol, guaiFENesin-dextromethorphan, LORazepam **OR** LORazepam, nitroGLYCERIN, polyethylene glycol, senna-docusate, sodium chloride flush   Vital Signs    Vitals:   06/26/18 1412 06/26/18 2039 06/26/18 2056 06/27/18 0555  BP: (!) 142/85 137/70  140/84  Pulse: 66 84  72  Resp: 19 20  18   Temp: (!) 97.5 F (36.4 C) 98.2 F (36.8 C)  98.1 F (36.7 C)  TempSrc: Oral Oral  Oral  SpO2: 97% 100% 100% 99%  Weight:      Height:        Intake/Output Summary (Last 24 hours) at 06/27/2018 0856 Last data filed at 06/27/2018 0272 Gross per 24 hour  Intake 1654.52 ml  Output 1805 ml  Net -150.48 ml   Filed Weights   06/24/18 1535 06/25/18 0319 06/26/18 0600  Weight: 68.7 kg 71.2 kg 72.1 kg    Telemetry    Normal sinus rhythm- Personally Reviewed  ECG    No new EKG to review- Personally Reviewed  Physical Exam   GEN: Well nourished, well developed in no acute distress HEENT: Normal NECK: No  JVD; No carotid bruits LYMPHATICS: No lymphadenopathy CARDIAC:RRR, no murmurs, rubs, gallops RESPIRATORY:  Clear to auscultation without rales, wheezing or rhonchi  ABDOMEN: Soft, non-tender, non-distended MUSCULOSKELETAL:  No edema; No deformity  SKIN: Warm and dry NEUROLOGIC:  Alert and oriented x 3 PSYCHIATRIC:  Normal affect    Labs    Chemistry Recent Labs  Lab 06/21/18 0237  06/22/18 0243  06/24/18 1034  06/25/18 0308 06/26/18 0529 06/27/18 0300  NA 134*   < > 136   < > 144   < > 142 140 140  K 3.5   < > 4.6   < > 3.6   < > 3.5 3.3* 3.8  CL 94*   < > 104   < > 111   < > 112* 107 110  CO2 25   < > 28   < > 26  --  25 27 23   GLUCOSE 100*   < > 121*   < > 108*   < > 94 94 89  BUN 31*   < > 14   < > 5*   < > 6* <5* 6*  CREATININE 0.96   < > 0.87   < >  0.98   < > 0.88 0.90 0.90  CALCIUM 9.5   < > 8.8*   < > 8.9  --  8.8* 9.0 8.7*  PROT 6.7  --  5.7*  --  5.9*  --   --   --   --   ALBUMIN 3.7  --  3.2*  --  3.3*  --   --   --   --   AST 31  --  29  --  33  --   --   --   --   ALT 26  --  21  --  23  --   --   --   --   ALKPHOS 62  --  50  --  67  --   --   --   --   BILITOT 0.7  --  0.9  --  0.7  --   --   --   --   GFRNONAA >60   < > >60   < > >60  --  >60 >60 >60  GFRAA >60   < > >60   < > >60  --  >60 >60 >60  ANIONGAP 15   < > 4*   < > 7  --  5 6 7    < > = values in this interval not displayed.     Hematology Recent Labs  Lab 06/25/18 0308 06/25/18 2204 06/26/18 0529 06/27/18 0300  WBC 4.5  --  6.0 6.3  RBC 2.35*  --  2.73* 2.70*  HGB 7.6* 9.3* 8.9* 8.8*  HCT 23.4* 27.9* 26.0* 26.3*  MCV 99.6  --  95.2 97.4  MCH 32.3  --  32.6 32.6  MCHC 32.5  --  34.2 33.5  RDW 13.4  --  14.2 13.8  PLT 190  --  187 199    Cardiac Enzymes Recent Labs  Lab 06/22/18 1317 06/24/18 1600 06/24/18 2144 06/25/18 0308  TROPONINI <0.03 0.08* 0.07* 0.06*    Recent Labs  Lab 06/24/18 1054  TROPIPOC 0.01     BNPNo results for input(s): BNP, PROBNP in the last 168  hours.   DDimer No results for input(s): DDIMER in the last 168 hours.   Radiology    Nm Myocar Multi W/spect W/wall Motion / Ef  Result Date: 06/26/2018 CLINICAL DATA:  Chest pain. Smoker. Hypertension. Hyperlipidemia. COPD. EXAM: MYOCARDIAL IMAGING WITH SPECT (REST AND PHARMACOLOGIC-STRESS) GATED LEFT VENTRICULAR WALL MOTION STUDY LEFT VENTRICULAR EJECTION FRACTION TECHNIQUE: Standard myocardial SPECT imaging was performed after resting intravenous injection of 10 mCi Tc-15m tetrofosmin. Subsequently, intravenous infusion of Lexiscan was performed under the supervision of the Cardiology staff. At peak effect of the drug, 30 mCi Tc-60m tetrofosmin was injected intravenously and standard myocardial SPECT imaging was performed. Quantitative gated imaging was also performed to evaluate left ventricular wall motion, and estimate left ventricular ejection fraction. COMPARISON:  Chest CT from 12/08/2017 FINDINGS: Perfusion: Small size moderate severity region of inducible ischemia in the anterior apical and anterior wall about 15% loss of perfusion activity. There is a larger region of matched moderate severity reduction in activity in the inferior wall favoring a scar. Wall Motion: Normal left ventricular wall motion. Mildly elevatedend-systolic volume of 70 cubic cm. Left Ventricular Ejection Fraction: 79% End diastolic volume 892 ml End systolic volume 70 ml IMPRESSION: 1. Small size moderate severity region of inducible ischemia in the anterior apical and anterior wall. Scarring in the inferior wall. 2. Normal left ventricular wall motion.  3. Left ventricular ejection fraction 54% 4. Non invasive risk stratification*: Intermediate *2012 Appropriate Use Criteria for Coronary Revascularization Focused Update: J Am Coll Cardiol. 1610;96(0):454-098. http://content.airportbarriers.com.aspx?articleid=[email or web address] Electronically Signed   By: Van Clines M.D.   On: 06/26/2018 12:41    Cardiac Studies   2D  echo pending   Patient Profile     67 y.o. male with HTN, HLD smoker with recent Mallory Weiss tear on EGD d/c 06/23/18 admitted with coughing and chest pain with minimal troponin elevation and normal ECG  Assessment & Plan    1.  Chest pain -chest pain similar to the pain he had with his recent Mallory Weiss tear  but his pain resolved with SL NTG and he now has new ST changes in the inferolateral leads with known significant coronary artery calcifications on Chest CT in 11/2017.  Minimally elevated trop with flat trend likely related to demand ischemia in the setting of significant anemia (0.08>0.07>0.06).  -continue and increase lopressor to 25mg  BID (BP elevated) and continue statin -echo showed normal LV function with EF 55 to 60% with mild MR and mild pulmonary hypertension PASP 36 mmHg. -had significant CP today during his stress myoview.  Perfusion imaging showed small sized moderate ischemia in the anteroapical and anterior wall with inferior scar EF 54%.   -He is n.p.o. for cardiac cath today to define coronary anatomy  -note placed by IM service says that GI has cleared patient for any anticoagulation needed. -Continue IV Heparin gtt per pharmacy, aspirin 81 mg daily, Crestor 20 mg daily and Lopressor 25 mg twice daily  2.  HTN -BP improved at 140/84 mmHg -Continue Lopressor to 25mg  BID  3.  Hyperlipidemia -continue statin -check FLP in am  4.  Recent Mallory-Weiss tear with GI bleeding -Status post hemostatic clip placement and epi injection on 06/22/2018. -Continue Carafate and PPI -note placed by IM service says that GI has cleared patient for any anticoagulation needed. -Hemoglobin stable at 8.8       For questions or updates, please contact [facility] Please consult [email or web address] for contact info under Cardiology/STEMI.      Signed, Fransico Him, MD  06/27/2018, 8:56 AM

## 2018-06-27 NOTE — Progress Notes (Signed)
Family Medicine Teaching Service Daily Progress Note Intern Pager: [phone]  Patient name: Lucas [name] Medical record number: [id number] Date of birth: 09/28/1951 Age: 67 y.o. Gender: male  Primary Care Provider: Steve Rattler, DO Consultants: Cards Code Status: Full   Assessment and Plan: [name] a 67 y.o.malepresenting with acute onset chest pain associated with dyspnea, diaphoresis, and paresthesias. PMH is significant forCOPD, HTN, daily alcohol use, tobacco use, chronic hyponatremia, and HLD.  Acute chest pain:No further CP or SOB. On heparin drip and planning for [facility] today 9/4. Myoview with small moderate severity region of inducible ischemia in the anterior apical and anterior wall. 2D Echo EF 55-60% with no wall motion abnormalities and pulmonary hypertension of 36 mmHg.  -Cardiology following; appreciate recs, Cath 9/4 -Nitro PRN -Heparin gtt    -Cont lopressor 12.5 BID and home statin, aspirin -Tx anemia and COPD as below  Jasper tear s/p hemostatic clip placement:EGD 8/30. Denies further bleeding. S/p 1 pRBCs 9/2, Hgb stable at 8.8.  -Cont Protonix 40mg  BID >>after 8 weeks titrate down to maintain control of GERD  -Cont sucralfate suspension 1g PO QID x2 weeks (started 8/31-)  -GI starting quadruple abx therapy tomorrow 9/5  COPD:Dry cough improving. On RA today.   -Cont LABA +LAMA (Anoro) and ICS   -Cont home Singulair -Loratadine 10mg  daily (in place of home cetirizine)  -Cont flonase   -Albuterol q4 hours PRN  -Tessalon 100mg  TID for cough; robitussin as needed   Hypertension:Stable, generally normotensive overnight. Previously on Candesartan-HCTZ at home.  -Cont to monitor BP  -Cont metoprolol   Alcoholuse disorder:Endorses drinking 4-5 16oz beers daily, with occasional liquor. Vitamin D low at 26.3   -CIWA protocol with ativan: 0 overnight  -Thiamine, folate, and multivitamin -Outpatient DEXA   -Start Vitamin D 800mg  daily    Macrocytic anemia: Stable.  Hgb 8.8, 7.6 on admit. S/p 1 U pRBCs 9/2. Recent decrease due to blood loss with acute GI bleed.  -Monitor CBC  Tobacco use:1 PPD (reported 1/2 PPD previously). Did not require a nicotine patch for recent admission.  -Nicotine patchas needed -Pharmacy to discuss smoking cessation further today, pt seems interested in Chantix  Chronic Hyponatremia:Stable, improved.  Na 140.  -Monitor BMP  Borderline prolonged AST:MHDQQIWL.  479 on EKG at admit, now 411. Mg wnl.  -Avoid QT prolonging agents  Allergies: On zyrtec and singulair at home - Continue singulair and claritin (on formulary) -Started Flonase 9/2  Hyperlipidemia:Last lipid panel in 2017, LDL 120. Takes rosuvastatin 20mg  at home.  -Cont home rosuvastatin  FEN/GI: Heart healthy diet, but currently NPO for cath today  PPX: Heparin gtt   Disposition: continued care, HC today   Subjective:  Doing well this am. Denies CP or SOB at rest, but does endorse some shortness of breath while walking to the bathroom. Ready to get his heart cath done today. Had a normal Bm yesterday.   Objective: Temp:  [97.5 F (36.4 C)-98.2 F (36.8 C)] 98.1 F (36.7 C) (09/04 0555) Pulse Rate:  [60-84] 72 (09/04 0555) Resp:  [18-20] 18 (09/04 0555) BP: (135-170)/(70-88) 140/84 (09/04 0555) SpO2:  [97 %-100 %] 99 % (09/04 0555) Physical Exam: General: Alert, NAD, watching tv comfortably  HEENT: NCAT, MMM Cardiac: RRR no m/g/r Lungs: Clear bilaterally with diminished breath sounds, no increased WOB on RA   Abdomen: soft, non-tender, non-distended, normoactive BS Msk: Moves all extremities spontaneously  Ext: Warm, dry, 2+ distal pulses, no edema   Laboratory: Recent Labs  Lab 06/25/18 0308 06/25/18 2204 06/26/18 0529 06/27/18 0300  WBC 4.5  --  6.0 6.3  HGB 7.6* 9.3* 8.9* 8.8*  HCT 23.4* 27.9* 26.0* 26.3*  PLT 190  --  187 199   Recent Labs  Lab 06/21/18 0237  06/22/18 0243   06/24/18 1034  06/25/18 0308 06/26/18 0529 06/27/18 0300  NA 134*   < > 136   < > 144   < > 142 140 140  K 3.5   < > 4.6   < > 3.6   < > 3.5 3.3* 3.8  CL 94*   < > 104   < > 111   < > 112* 107 110  CO2 25   < > 28   < > 26  --  25 27 23   BUN 31*   < > 14   < > 5*   < > 6* <5* 6*  CREATININE 0.96   < > 0.87   < > 0.98   < > 0.88 0.90 0.90  CALCIUM 9.5   < > 8.8*   < > 8.9  --  8.8* 9.0 8.7*  PROT 6.7  --  5.7*  --  5.9*  --   --   --   --   BILITOT 0.7  --  0.9  --  0.7  --   --   --   --   ALKPHOS 62  --  50  --  67  --   --   --   --   ALT 26  --  21  --  23  --   --   --   --   AST 31  --  29  --  33  --   --   --   --   GLUCOSE 100*   < > 121*   < > 108*   < > 94 94 89   < > = values in this interval not displayed.    Imaging/Diagnostic Tests: Nm Myocar Multi W/spect W/wall Motion / Ef  Result Date: 06/26/2018 CLINICAL DATA:  Chest pain. Smoker. Hypertension. Hyperlipidemia. COPD. EXAM: MYOCARDIAL IMAGING WITH SPECT (REST AND PHARMACOLOGIC-STRESS) GATED LEFT VENTRICULAR WALL MOTION STUDY LEFT VENTRICULAR EJECTION FRACTION TECHNIQUE: Standard myocardial SPECT imaging was performed after resting intravenous injection of 10 mCi Tc-38m tetrofosmin. Subsequently, intravenous infusion of Lexiscan was performed under the supervision of the Cardiology staff. At peak effect of the drug, 30 mCi Tc-76m tetrofosmin was injected intravenously and standard myocardial SPECT imaging was performed. Quantitative gated imaging was also performed to evaluate left ventricular wall motion, and estimate left ventricular ejection fraction. COMPARISON:  Chest CT from 12/08/2017 FINDINGS: Perfusion: Small size moderate severity region of inducible ischemia in the anterior apical and anterior wall about 15% loss of perfusion activity. There is a larger region of matched moderate severity reduction in activity in the inferior wall favoring a scar. Wall Motion: Normal left ventricular wall motion. Mildly  elevatedend-systolic volume of 70 cubic cm. Left Ventricular Ejection Fraction: 40% End diastolic volume 981 ml End systolic volume 70 ml IMPRESSION: 1. Small size moderate severity region of inducible ischemia in the anterior apical and anterior wall. Scarring in the inferior wall. 2. Normal left ventricular wall motion. 3. Left ventricular ejection fraction 54% 4. Non invasive risk stratification*: Intermediate *2012 Appropriate Use Criteria for Coronary Revascularization Focused Update: J Am Coll Cardiol. 1914;78(2):956-213. http://content.[email or web address]?articleid=[email or web address] Electronically Signed   By: Van Clines M.D.   On:  06/26/2018 12:41    Patriciaann Clan, DO 06/27/2018, 6:44 AM PGY-1, [place] Intern pager: [phone], text pages welcome

## 2018-06-27 NOTE — Research (Signed)
CADFEM Informed Consent   Subject Name: Lucas [name]  Subject met inclusion and exclusion criteria.  The informed consent form, study requirements and expectations were reviewed with the subject and questions and concerns were addressed prior to the signing of the consent form.  The subject verbalized understanding of the trail requirements.  The subject agreed to participate in the CADFEM trial and signed the informed consent.  The informed consent was obtained prior to performance of any protocol-specific procedures for the subject.  A copy of the signed informed consent was given to the subject and a copy was placed in the subject's medical record.  [name] 06/27/2018, 10:17 AM

## 2018-06-27 NOTE — Progress Notes (Signed)
Discussed cath results with Dr. Martinique.  Cath showed 35% proximal RCA, 85% and 50% sequential lesions in the mid RCA, 70% ostial left circumflex, 65% left main/proximal LAD, occluded D2 and normal LV function.  He has three-vessel coronary disease with heavily calcified arteries.  The LAD is a long 65% stenosis extending from the distal left main to the proximal LAD.  He also has a 70% ostial to proximal left circumflex, occluded diagonal and 85% mid RCA.  He is felt not to be an ideal candidate for PCI.  The LAD and left circumflex are not amenable to PCI and are not critical.  The mid RCA lesion is more severe but require atherectomy and stenting.  Prior to his GI bleed he had not had chest pain for 6 months prior.  His last episode of chest pain which brought him to the hospital was similar to what he had with his Mallory-Weiss tear so it is difficult to determine whether that chest pain was actually ischemia driven.  He did have chest pain during his nuclear stress test with EKG changes consistent with ischemia.  I discussed his comorbidities with Dr. Martinique and given his ongoing alcohol abuse with recent Mallory-Weiss tear and medical noncompliance would recommend aggressive medical therapy for now.  If he continues to have recurrent chest pain then could consider PCI of the RCA.  [facility] will sign off.   Medication Recommendations: Aspirin 81 mg daily Lopressor 25 mg twice daily, Imdur 30 mg daily, Crestor 20 mg daily. Other recommendations (labs, testing, etc): None Follow up as an outpatient: Follow-up with Dr. Martinique in 4 weeks or extender.  If he has recurrent chest pain on axial medical therapy then would consider PCI of the RCA.

## 2018-06-27 NOTE — Plan of Care (Signed)
  Problem: Pain Managment: Goal: General experience of comfort will improve Outcome: Progressing Note:  Denies c/o pain or discomfort.

## 2018-06-27 NOTE — Progress Notes (Addendum)
H Pylori is positive on biopsy.      GI rec is for  Doxycycline 100 mg BID x 14 d Bismuth 525 mg QID x 14 d Flagyl 250 mg BID x 14 d PPI (Protonix 40 mg or equivalent) x 14 d.  Then drop to 1 x daily thereafter.    No reason to hold off on treatment so wrote orders to start tomorrow.  Will need to abstain from all ETOH while taking Flagyl.     Dr Lynne Leader (his GI primary) office will be in touch with pt re follow up and testing stool H Pylori Ag to confirm eradication.    Azucena Freed PA-C.     Attending physician's note   Thank you Sarah. I completely agree with initiating quadruple therapy along with continued high dose acid suppression therapy to facilitate mucosal healing. Agree with plan to follow-up in the clinic and confirm eradication following completion of antimicrobial therapy as outlined.   [address] DO, FACG [phone] office

## 2018-06-27 NOTE — H&P (View-Only) (Signed)
Progress Note  Patient Name: Lucas [name] Date of Encounter: 06/27/2018  Primary Cardiologist: No primary care provider on file.   Subjective   Denies any further chest pain or shortness of breath Inpatient Medications    Scheduled Meds: . aspirin EC  81 mg Oral Daily  . benzonatate  100 mg Oral TID  . budesonide  0.25 mg Nebulization BID  . fluticasone  2 spray Each Nare Daily  . folic acid  1 mg Oral Daily  . loratadine  10 mg Oral Daily  . LORazepam  0-4 mg Oral Q12H  . metoprolol tartrate  25 mg Oral BID  . montelukast  10 mg Oral QHS  . multivitamin with minerals  1 tablet Oral Daily  . pantoprazole  40 mg Oral BID  . rosuvastatin  20 mg Oral Daily  . sodium chloride flush  3 mL Intravenous Q12H  . sucralfate  1 g Oral QID  . thiamine  100 mg Oral Daily  . umeclidinium-vilanterol  1 puff Inhalation Daily   Continuous Infusions: . sodium chloride    . sodium chloride 1 mL/kg/hr (06/27/18 0533)  . heparin 950 Units/hr (06/26/18 2120)   PRN Meds: sodium chloride, albuterol, guaiFENesin-dextromethorphan, LORazepam **OR** LORazepam, nitroGLYCERIN, polyethylene glycol, senna-docusate, sodium chloride flush   Vital Signs    Vitals:   06/26/18 1412 06/26/18 2039 06/26/18 2056 06/27/18 0555  BP: (!) 142/85 137/70  140/84  Pulse: 66 84  72  Resp: 19 20  18   Temp: (!) 97.5 F (36.4 C) 98.2 F (36.8 C)  98.1 F (36.7 C)  TempSrc: Oral Oral  Oral  SpO2: 97% 100% 100% 99%  Weight:      Height:        Intake/Output Summary (Last 24 hours) at 06/27/2018 0856 Last data filed at 06/27/2018 8242 Gross per 24 hour  Intake 1654.52 ml  Output 1805 ml  Net -150.48 ml   Filed Weights   06/24/18 1535 06/25/18 0319 06/26/18 0600  Weight: 68.7 kg 71.2 kg 72.1 kg    Telemetry    Normal sinus rhythm- Personally Reviewed  ECG    No new EKG to review- Personally Reviewed  Physical Exam   GEN: Well nourished, well developed in no acute distress HEENT: Normal NECK: No  JVD; No carotid bruits LYMPHATICS: No lymphadenopathy CARDIAC:RRR, no murmurs, rubs, gallops RESPIRATORY:  Clear to auscultation without rales, wheezing or rhonchi  ABDOMEN: Soft, non-tender, non-distended MUSCULOSKELETAL:  No edema; No deformity  SKIN: Warm and dry NEUROLOGIC:  Alert and oriented x 3 PSYCHIATRIC:  Normal affect    Labs    Chemistry Recent Labs  Lab 06/21/18 0237  06/22/18 0243  06/24/18 1034  06/25/18 0308 06/26/18 0529 06/27/18 0300  NA 134*   < > 136   < > 144   < > 142 140 140  K 3.5   < > 4.6   < > 3.6   < > 3.5 3.3* 3.8  CL 94*   < > 104   < > 111   < > 112* 107 110  CO2 25   < > 28   < > 26  --  25 27 23   GLUCOSE 100*   < > 121*   < > 108*   < > 94 94 89  BUN 31*   < > 14   < > 5*   < > 6* <5* 6*  CREATININE 0.96   < > 0.87   < >  0.98   < > 0.88 0.90 0.90  CALCIUM 9.5   < > 8.8*   < > 8.9  --  8.8* 9.0 8.7*  PROT 6.7  --  5.7*  --  5.9*  --   --   --   --   ALBUMIN 3.7  --  3.2*  --  3.3*  --   --   --   --   AST 31  --  29  --  33  --   --   --   --   ALT 26  --  21  --  23  --   --   --   --   ALKPHOS 62  --  50  --  67  --   --   --   --   BILITOT 0.7  --  0.9  --  0.7  --   --   --   --   GFRNONAA >60   < > >60   < > >60  --  >60 >60 >60  GFRAA >60   < > >60   < > >60  --  >60 >60 >60  ANIONGAP 15   < > 4*   < > 7  --  5 6 7    < > = values in this interval not displayed.     Hematology Recent Labs  Lab 06/25/18 0308 06/25/18 2204 06/26/18 0529 06/27/18 0300  WBC 4.5  --  6.0 6.3  RBC 2.35*  --  2.73* 2.70*  HGB 7.6* 9.3* 8.9* 8.8*  HCT 23.4* 27.9* 26.0* 26.3*  MCV 99.6  --  95.2 97.4  MCH 32.3  --  32.6 32.6  MCHC 32.5  --  34.2 33.5  RDW 13.4  --  14.2 13.8  PLT 190  --  187 199    Cardiac Enzymes Recent Labs  Lab 06/22/18 1317 06/24/18 1600 06/24/18 2144 06/25/18 0308  TROPONINI <0.03 0.08* 0.07* 0.06*    Recent Labs  Lab 06/24/18 1054  TROPIPOC 0.01     BNPNo results for input(s): BNP, PROBNP in the last 168  hours.   DDimer No results for input(s): DDIMER in the last 168 hours.   Radiology    Nm Myocar Multi W/spect W/wall Motion / Ef  Result Date: 06/26/2018 CLINICAL DATA:  Chest pain. Smoker. Hypertension. Hyperlipidemia. COPD. EXAM: MYOCARDIAL IMAGING WITH SPECT (REST AND PHARMACOLOGIC-STRESS) GATED LEFT VENTRICULAR WALL MOTION STUDY LEFT VENTRICULAR EJECTION FRACTION TECHNIQUE: Standard myocardial SPECT imaging was performed after resting intravenous injection of 10 mCi Tc-50m tetrofosmin. Subsequently, intravenous infusion of Lexiscan was performed under the supervision of the Cardiology staff. At peak effect of the drug, 30 mCi Tc-34m tetrofosmin was injected intravenously and standard myocardial SPECT imaging was performed. Quantitative gated imaging was also performed to evaluate left ventricular wall motion, and estimate left ventricular ejection fraction. COMPARISON:  Chest CT from 12/08/2017 FINDINGS: Perfusion: Small size moderate severity region of inducible ischemia in the anterior apical and anterior wall about 15% loss of perfusion activity. There is a larger region of matched moderate severity reduction in activity in the inferior wall favoring a scar. Wall Motion: Normal left ventricular wall motion. Mildly elevatedend-systolic volume of 70 cubic cm. Left Ventricular Ejection Fraction: 79% End diastolic volume 024 ml End systolic volume 70 ml IMPRESSION: 1. Small size moderate severity region of inducible ischemia in the anterior apical and anterior wall. Scarring in the inferior wall. 2. Normal left ventricular wall motion.  3. Left ventricular ejection fraction 54% 4. Non invasive risk stratification*: Intermediate *2012 Appropriate Use Criteria for Coronary Revascularization Focused Update: J Am Coll Cardiol. 8592;92(4):462-863. http://content.[email or web address].aspx?articleid=[email or web address] Electronically Signed   By: Van Clines M.D.   On: 06/26/2018 12:41    Cardiac Studies   2D  echo pending   Patient Profile     67 y.o. male with HTN, HLD smoker with recent Mallory Weiss tear on EGD d/c 06/23/18 admitted with coughing and chest pain with minimal troponin elevation and normal ECG  Assessment & Plan    1.  Chest pain -chest pain similar to the pain he had with his recent Mallory Weiss tear  but his pain resolved with SL NTG and he now has new ST changes in the inferolateral leads with known significant coronary artery calcifications on Chest CT in 11/2017.  Minimally elevated trop with flat trend likely related to demand ischemia in the setting of significant anemia (0.08>0.07>0.06).  -continue and increase lopressor to 25mg  BID (BP elevated) and continue statin -echo showed normal LV function with EF 55 to 60% with mild MR and mild pulmonary hypertension PASP 36 mmHg. -had significant CP today during his stress myoview.  Perfusion imaging showed small sized moderate ischemia in the anteroapical and anterior wall with inferior scar EF 54%.   -He is n.p.o. for cardiac cath today to define coronary anatomy  -note placed by IM service says that GI has cleared patient for any anticoagulation needed. -Continue IV Heparin gtt per pharmacy, aspirin 81 mg daily, Crestor 20 mg daily and Lopressor 25 mg twice daily  2.  HTN -BP improved at 140/84 mmHg -Continue Lopressor to 25mg  BID  3.  Hyperlipidemia -continue statin -check FLP in am  4.  Recent Mallory-Weiss tear with GI bleeding -Status post hemostatic clip placement and epi injection on 06/22/2018. -Continue Carafate and PPI -note placed by IM service says that GI has cleared patient for any anticoagulation needed. -Hemoglobin stable at 8.8       For questions or updates, please contact [place] Please consult [email or web address] for contact info under Cardiology/STEMI.      Signed, Fransico Him, MD  06/27/2018, 8:56 AM

## 2018-06-28 ENCOUNTER — Encounter (HOSPITAL_COMMUNITY): Payer: Self-pay | Admitting: Cardiology

## 2018-06-28 LAB — CBC
HEMATOCRIT: 25.2 % — AB (ref 39.0–52.0)
Hemoglobin: 8.5 g/dL — ABNORMAL LOW (ref 13.0–17.0)
MCH: 32.7 pg (ref 26.0–34.0)
MCHC: 33.7 g/dL (ref 30.0–36.0)
MCV: 96.9 fL (ref 78.0–100.0)
PLATELETS: 214 10*3/uL (ref 150–400)
RBC: 2.6 MIL/uL — AB (ref 4.22–5.81)
RDW: 13.7 % (ref 11.5–15.5)
WBC: 5.8 10*3/uL (ref 4.0–10.5)

## 2018-06-28 LAB — LIPID PANEL
CHOLESTEROL: 96 mg/dL (ref 0–200)
HDL: 48 mg/dL (ref >40)
LDL Cholesterol: 37 mg/dL (ref 0–99)
TRIGLYCERIDES: 56 mg/dL (ref <150)
Total CHOL/HDL Ratio: 2 RATIO
VLDL: 11 mg/dL (ref 0–40)

## 2018-06-28 LAB — BASIC METABOLIC PANEL
Anion gap: 6 (ref 5–15)
BUN: 5 mg/dL — AB (ref 8–23)
CO2: 24 mmol/L (ref 22–32)
Calcium: 8.6 mg/dL — ABNORMAL LOW (ref 8.9–10.3)
Chloride: 109 mmol/L (ref 98–111)
Creatinine, Ser: 0.99 mg/dL (ref 0.61–1.24)
GFR calc Af Amer: >60 mL/min (ref >60)
GFR calc non Af Amer: >60 mL/min (ref >60)
GLUCOSE: 107 mg/dL — AB (ref 70–99)
Potassium: 3.5 mmol/L (ref 3.5–5.1)
Sodium: 139 mmol/L (ref 135–145)

## 2018-06-28 MED ORDER — FLUTICASONE PROPIONATE 50 MCG/ACT NA SUSP: 2.0000 | Freq: Every day | NASAL | 0 refills | Status: DC

## 2018-06-28 MED ORDER — METRONIDAZOLE 250 MG PO TABS: 250.0000 mg | ORAL_TABLET | Freq: Two times a day (BID) | ORAL | 0 refills | Status: AC

## 2018-06-28 MED ORDER — ISOSORBIDE MONONITRATE ER 30 MG PO TB24: 30.0000 mg | ORAL_TABLET | Freq: Every day | ORAL | 0 refills | Status: DC

## 2018-06-28 MED ORDER — METOPROLOL TARTRATE 25 MG PO TABS: 25.0000 mg | ORAL_TABLET | Freq: Two times a day (BID) | ORAL | 0 refills | Status: DC

## 2018-06-28 MED ORDER — DOXYCYCLINE HYCLATE 100 MG PO TABS: 100.0000 mg | ORAL_TABLET | Freq: Two times a day (BID) | ORAL | 0 refills | Status: AC

## 2018-06-28 MED ORDER — ASPIRIN 81 MG PO TBEC: 81.0000 mg | DELAYED_RELEASE_TABLET | Freq: Every day | ORAL | 0 refills | Status: DC

## 2018-06-28 MED ORDER — FLUTICASONE-UMECLIDIN-VILANT 100-62.5-25 MCG/INH IN AEPB: 1.0000 | INHALATION_SPRAY | Freq: Every day | RESPIRATORY_TRACT | 0 refills | Status: AC

## 2018-06-28 MED ORDER — VITAMIN D3 10 MCG (400 UNIT) PO TABS: 800.0000 [IU] | ORAL_TABLET | Freq: Every day | ORAL | 0 refills | Status: DC

## 2018-06-28 MED ORDER — BISMUTH SUBSALICYLATE 262 MG PO CHEW: 524.0000 mg | CHEWABLE_TABLET | Freq: Four times a day (QID) | ORAL | 0 refills | Status: AC

## 2018-06-28 NOTE — Discharge Instructions (Signed)
You were admitted to [facility] for new onset chest pain. You had a stress test and heart cath done showing some disease in your heart vessels, and were started on medications to help with this. You were also found to have H. Pylori (bacteria in the stomach) from the stomach scope you had done last admission, and were started on antibiotics for that as well. You need to avoid from drinking any alcohol while on these antibiotics. Please make sure you follow up with your PCP in the next week and follow up with Cardiology on 07/11/2018. Seek medical care if you have more chest pain episodes, shortness of breath, or blood in your vomit or stool.    Angiogram, Care After This sheet gives you information about how to care for yourself after your procedure. Your doctor may also give you more specific instructions. If you have problems or questions, contact your doctor. Follow these instructions at home: Insertion site care  Follow instructions from your doctor about how to take care of your long, thin tube (catheter) insertion area. Make sure you: ? Wash your hands with soap and water before you change your bandage (dressing). If you cannot use soap and water, use hand sanitizer. ? Change your bandage as told by your doctor. ? Leave stitches (sutures), skin glue, or skin tape (adhesive) strips in place. They may need to stay in place for 2 weeks or longer. If tape strips get loose and curl up, you may trim the loose edges. Do not remove tape strips completely unless your doctor says it is okay.  Do not take baths, swim, or use a hot tub until your doctor says it is okay.  You may shower 24-48 hours after the procedure or as told by your doctor. ? Gently wash the area with plain soap and water. ? Pat the area dry with a clean towel. ? Do not rub the area. This may cause bleeding.  Do not apply powder or lotion to the area. Keep the area clean and dry.  Check your insertion area every day for  signs of infection. Check for: ? More redness, swelling, or pain. ? Fluid or blood. ? Warmth. ? Pus or a bad smell. Activity  Rest as told by your doctor, usually for 1-2 days.  Do not lift anything that is heavier than 10 lbs. (4.5 kg) or as told by your doctor.  Do not drive for 24 hours if you were given a medicine to help you relax (sedative).  Do not drive or use heavy machinery while taking prescription pain medicine. General instructions  Go back to your normal activities as told by your doctor, usually in about a week. Ask your doctor what activities are safe for you.  If the insertion area starts to bleed, lie flat and put pressure on the area. If the bleeding does not stop, get help right away. This is an emergency.  Drink enough fluid to keep your pee (urine) clear or pale yellow.  Take over-the-counter and prescription medicines only as told by your doctor.  Keep all follow-up visits as told by your doctor. This is important. Contact a doctor if:  You have a fever.  You have chills.  You have more redness, swelling, or pain around your insertion area.  You have fluid or blood coming from your insertion area.  The insertion area feels warm to the touch.  You have pus or a bad smell coming from your insertion area.  You  have more bruising around the insertion area.  Blood collects in the tissue around the insertion area (hematoma) that may be painful to the touch. Get help right away if:  You have a lot of pain in the insertion area.  The insertion area swells very fast.  The insertion area is bleeding, and the bleeding does not stop after holding steady pressure on the area.  The area near or just beyond the insertion area becomes pale, cool, tingly, or numb. These symptoms may be an emergency. Do not wait to see if the symptoms will go away. Get medical help right away. Call your local emergency services (911 in the [place] Do not drive yourself to the  hospital. Summary  After the procedure, it is common to have bruising and tenderness at the long, thin tube insertion area.  After the procedure, it is important to rest and drink plenty of fluids.  Do not take baths, swim, or use a hot tub until your doctor says it is okay to do so. You may shower 24-48 hours after the procedure or as told by your doctor.  If the insertion area starts to bleed, lie flat and put pressure on the area. If the bleeding does not stop, get help right away. This is an emergency. This information is not intended to replace advice given to you by your health care provider. Make sure you discuss any questions you have with your health care provider. Document Released: 01/06/2009 Document Revised: 10/04/2016 Document Reviewed: 10/04/2016 Elsevier Interactive Patient Education  2017 [place] Heart-healthy meal planning includes:  Limiting unhealthy fats.  Increasing healthy fats.  Making other small dietary changes.  You may need to talk with your doctor or a diet specialist (dietitian) to create an eating plan that is right for you. What types of fat should I choose?  Choose healthy fats. These include olive oil and canola oil, flaxseeds, walnuts, almonds, and seeds.  Eat more omega-3 fats. These include salmon, mackerel, sardines, tuna, flaxseed oil, and ground flaxseeds. Try to eat fish at least twice each week.  Limit saturated fats. ? Saturated fats are often found in animal products, such as meats, butter, and cream. ? Plant sources of saturated fats include palm oil, palm kernel oil, and coconut oil.  Avoid foods with partially hydrogenated oils in them. These include stick margarine, some tub margarines, cookies, crackers, and other baked goods. These contain trans fats. What general guidelines do I need to follow?  Check food labels carefully. Identify foods with trans fats or high amounts of saturated  fat.  Fill one half of your plate with vegetables and green salads. Eat 4-5 servings of vegetables per day. A serving of vegetables is: ? 1 cup of raw leafy vegetables. ?  cup of raw or cooked cut-up vegetables. ?  cup of vegetable juice.  Fill one fourth of your plate with whole grains. Look for the word "whole" as the first word in the ingredient list.  Fill one fourth of your plate with lean protein foods.  Eat 4-5 servings of fruit per day. A serving of fruit is: ? One medium whole fruit. ?  cup of dried fruit. ?  cup of fresh, frozen, or canned fruit. ?  cup of 100% fruit juice.  Eat more foods that contain soluble fiber. These include apples, broccoli, carrots, beans, peas, and barley. Try to get 20-30 g of fiber per day.  Eat more home-cooked food. Eat  less restaurant, buffet, and fast food.  Limit or avoid alcohol.  Limit foods high in starch and sugar.  Avoid fried foods.  Avoid frying your food. Try baking, boiling, grilling, or broiling it instead. You can also reduce fat by: ? Removing the skin from poultry. ? Removing all visible fats from meats. ? Skimming the fat off of stews, soups, and gravies before serving them. ? Steaming vegetables in water or broth.  Lose weight if you are overweight.  Eat 4-5 servings of nuts, legumes, and seeds per week: ? One serving of dried beans or legumes equals  cup after being cooked. ? One serving of nuts equals 1 ounces. ? One serving of seeds equals  ounce or one tablespoon.  You may need to keep track of how much salt or sodium you eat. This is especially true if you have high blood pressure. Talk with your doctor or dietitian to get more information. What foods can I eat? Grains Breads, including [place], white, pita, wheat, raisin, rye, oatmeal, and [place]. Tortillas that are neither fried nor made with lard or trans fat. Low-fat rolls, including hotdog and hamburger buns and English muffins. Biscuits. Muffins.  Waffles. Pancakes. Light popcorn. Whole-grain cereals. Flatbread. Melba toast. Pretzels. Breadsticks. Rusks. Low-fat snacks. Low-fat crackers, including oyster, saltine, matzo, graham, animal, and rye. Rice and pasta, including brown rice and pastas that are made with whole wheat. Vegetables All vegetables. Fruits All fruits, but limit coconut. Meats and Other Protein Sources Lean, well-trimmed beef, veal, pork, and lamb. Chicken and [place] without skin. All fish and shellfish. Wild duck, rabbit, pheasant, and venison. Egg whites or low-cholesterol egg substitutes. Dried beans, peas, lentils, and tofu. Seeds and most nuts. Dairy Low-fat or nonfat cheeses, including ricotta, string, and mozzarella. Skim or 1% milk that is liquid, powdered, or evaporated. Buttermilk that is made with low-fat milk. Nonfat or low-fat yogurt. Beverages Mineral water. Diet carbonated beverages. Sweets and Desserts Sherbets and fruit ices. Honey, jam, marmalade, jelly, and syrups. Meringues and gelatins. Pure sugar candy, such as hard candy, jelly beans, gumdrops, mints, marshmallows, and small amounts of dark chocolate. [employer] Eat all sweets and desserts in moderation. Fats and Oils Nonhydrogenated (trans-free) margarines. Vegetable oils, including soybean, sesame, sunflower, olive, peanut, safflower, corn, canola, and cottonseed. Salad dressings or mayonnaise made with a vegetable oil. Limit added fats and oils that you use for cooking, baking, salads, and as spreads. Other Cocoa powder. Coffee and tea. All seasonings and condiments. The items listed above may not be a complete list of recommended foods or beverages. Contact your dietitian for more options. What foods are not recommended? Grains Breads that are made with saturated or trans fats, oils, or whole milk. Croissants. Butter rolls. Cheese breads. Sweet rolls. Donuts. Buttered popcorn. Chow mein noodles. High-fat crackers, such as cheese or butter  crackers. Meats and Other Protein Sources Fatty meats, such as hotdogs, short ribs, sausage, spareribs, bacon, rib eye roast or steak, and mutton. High-fat deli meats, such as salami and bologna. Caviar. Domestic duck and goose. Organ meats, such as kidney, liver, sweetbreads, and heart. Dairy Cream, sour cream, cream cheese, and creamed cottage cheese. Whole-milk cheeses, including blue (bleu), Monte[place] Jack, [place], [place], [place], [place], Swiss, cheddar, [place], and [place]. Whole or 2% milk that is liquid, evaporated, or condensed. Whole buttermilk. Cream sauce or high-fat cheese sauce. Yogurt that is made from whole milk. Beverages Regular sodas and juice drinks with added sugar. Sweets and Desserts Frosting. Pudding. Cookies. Cakes  other than angel food cake. Candy that has milk chocolate or white chocolate, hydrogenated fat, butter, coconut, or unknown ingredients. Buttered syrups. Full-fat ice cream or ice cream drinks. Fats and Oils Gravy that has suet, meat fat, or shortening. Cocoa butter, hydrogenated oils, palm oil, coconut oil, palm kernel oil. These can often be found in baked products, candy, fried foods, nondairy creamers, and whipped toppings. Solid fats and shortenings, including bacon fat, salt pork, lard, and butter. Nondairy cream substitutes, such as coffee creamers and sour cream substitutes. Salad dressings that are made of unknown oils, cheese, or sour cream. The items listed above may not be a complete list of foods and beverages to avoid. Contact your dietitian for more information. This information is not intended to replace advice given to you by your health care provider. Make sure you discuss any questions you have with your health care provider. Document Released: 04/10/2012 Document Revised: 03/17/2016 Document Reviewed: 04/03/2014 Elsevier Interactive Patient Education  [employer].

## 2018-06-28 NOTE — Care Management Important Message (Signed)
Important Message  Patient Details  Name: Lucas [name] MRN: [id number] Date of Birth: 31-Mar-1951   Medicare Important Message Given:  Yes    Bianna Haran P Tolleson 06/28/2018, 3:22 PM

## 2018-06-28 NOTE — Progress Notes (Signed)
Family Medicine Teaching Service Daily Progress Note Intern Pager: [phone]  Patient name: Lucas [name] Medical record number: [id number] Date of birth: 22-Apr-1951 Age: 67 y.o. Gender: male  Primary Care Provider: Steve Rattler, DO Consultants: Cards Code Status: Full   Assessment and Plan: Rajendra Spiller Fordis a 66 y.o.malepresenting with acute onset chest pain associated with dyspnea, diaphoresis, and paresthesias. PMH is significant forCOPD, HTN, daily alcohol use, tobacco use, chronic hyponatremia, and HLD.  Acute chest pain: No further CP or SOB. POD#1 heart cath yesterday, found 3 vessel disease with 100% 2nd diagonal, 70% ostial to proximal Lcx, 85% mid LCA. Cardiology recommending aggressive medical management, could consider PCI of RCA in future.  -Cardiology to follow up outpt in 4 weeks  -Aspirin 81mg , lopressor 25 BID, imdur 30mg , crestor 20mg  daily -Nitro PRN   Mallory weiss tear s/p hemostatic clip placement:EGD 8/30. Denies further bleeding. S/p 1 pRBCs 9/2, Hgb stable at 8.5.  -Cont Protonix 40mg  BID >>after 8 weeks titrate down to maintain control of GERD  -Cont sucralfate suspension 1g PO QID x2 weeks (started 8/31-)  -GI starting quadruple abx therapy starting today (bismuth, flagyl, doxycycline, PPI)   COPD:Stable.  -Cont LABA +LAMA ([place]) and ICS   -Cont home Singulair -Loratadine 10mg  daily (in place of home cetirizine)  -Cont flonase   -Albuterol q4 hours PRN  -Robitussin as needed   Hypertension:Stable, normotensive overnight.  Previously on Candesartan-HCTZ at home.  -Cont to monitor BP  -Cont metoprolol   Alcoholuse disorder:Endorses drinking 4-5 16oz beers daily, with occasional liquor.   -CIWA protocol with ativan: 0 overnight  -Thiamine, folate, and multivitamin -Outpatient DEXA   -Vitamin D 800mg  daily   Macrocytic anemia: Stable.  Hgb 8.5, 8.8 yesterday. S/p 1 U pRBCs 9/2. Recent decrease due to blood loss with acute GI bleed.   -Monitor CBC  Tobacco use:1 PPD (reported 1/2 PPD previously). .  -Nicotine patchas needed -Interested in starting Chantix outpatient per pharmacy discussion   Chronic Hyponatremia:Stable, improved.  Na 139.  -Monitor BMP  Borderline prolonged MCN:[id number].  479 on EKG at admit, now 411. Mg wnl.  -Avoid QT prolonging agents  Allergies: On zyrtec and singulair at home - Continue singulair and claritin (on formulary) -Started Flonase 9/2  Hyperlipidemia:Last lipid panel in 2017, LDL 120.  -Cont home rosuvastatin  FEN/GI: Heart healthy diet PPX: Lovenox   Disposition: Likely d/c this afternoon    Subjective:  Doing well, ready to go home. Denies abdominal pain, CP, SOB.   Objective: Temp:  [98.1 F (36.7 C)-98.4 F (36.9 C)] 98.1 F (36.7 C) (09/05 0605) Pulse Rate:  [56-83] 79 (09/05 0605) Resp:  [12-38] 16 (09/05 0605) BP: (107-152)/(59-87) 127/71 (09/05 0605) SpO2:  [93 %-100 %] 97 % (09/05 0605) Weight:  [72.3 kg] 72.3 kg (09/05 0605) Physical Exam: General: Alert, NAD HEENT: NCAT, MMM Cardiac: RRR no m/g/r Lungs: Clear bilaterally with slightly inspiratory rhonchi, no increased WOB  Abdomen: soft, non-tender, non-distended, normoactive BS Msk: Moves all extremities spontaneously  Ext: Warm, dry, 2+ distal pulses, no edema    Laboratory: Recent Labs  Lab 06/26/18 0529 06/27/18 0300 06/27/18 2344  WBC 6.0 6.3 5.8  HGB 8.9* 8.8* 8.5*  HCT 26.0* 26.3* 25.2*  PLT 187 199 214   Recent Labs  Lab 06/22/18 0243  06/24/18 1034  06/26/18 0529 06/27/18 0300 06/27/18 2344  NA 136   < > 144   < > 140 140 139  K 4.6   < > 3.6   < >  3.3* 3.8 3.5  CL 104   < > 111   < > 107 110 109  CO2 28   < > 26   < > 27 23 24   BUN 14   < > 5*   < > <5* 6* 5*  CREATININE 0.87   < > 0.98   < > 0.90 0.90 0.99  CALCIUM 8.8*   < > 8.9   < > 9.0 8.7* 8.6*  PROT 5.7*  --  5.9*  --   --   --   --   BILITOT 0.9  --  0.7  --   --   --   --   ALKPHOS 50  --  67   --   --   --   --   ALT 21  --  23  --   --   --   --   AST 29  --  33  --   --   --   --   GLUCOSE 121*   < > 108*   < > 94 89 107*   < > = values in this interval not displayed.    Imaging/Diagnostic Tests: No results found.  Patriciaann Clan, DO 06/28/2018, 6:36 AM PGY-1, [place] Intern pager: [phone], text pages welcome

## 2018-06-29 ENCOUNTER — Inpatient Hospital Stay: Payer: Medicare HMO | Admitting: Family Medicine

## 2018-07-03 ENCOUNTER — Encounter: Payer: Self-pay | Admitting: Family Medicine

## 2018-07-03 ENCOUNTER — Other Ambulatory Visit: Payer: Self-pay

## 2018-07-03 ENCOUNTER — Ambulatory Visit (INDEPENDENT_AMBULATORY_CARE_PROVIDER_SITE_OTHER): Payer: Medicare HMO | Admitting: Family Medicine

## 2018-07-03 VITALS — BP 112/61 | HR 88 | Temp 97.9°F | Wt 154.0 lb

## 2018-07-03 DIAGNOSIS — Z1159 Encounter for screening for other viral diseases: Secondary | ICD-10-CM

## 2018-07-03 DIAGNOSIS — J42 Unspecified chronic bronchitis: Secondary | ICD-10-CM

## 2018-07-03 DIAGNOSIS — A048 Other specified bacterial intestinal infections: Secondary | ICD-10-CM

## 2018-07-03 DIAGNOSIS — E559 Vitamin D deficiency, unspecified: Secondary | ICD-10-CM

## 2018-07-03 DIAGNOSIS — I208 Other forms of angina pectoris: Secondary | ICD-10-CM | POA: Diagnosis not present

## 2018-07-03 DIAGNOSIS — K226 Gastro-esophageal laceration-hemorrhage syndrome: Secondary | ICD-10-CM

## 2018-07-03 MED ORDER — VITAMIN D3 10 MCG (400 UNIT) PO TABS: 800.0000 [IU] | ORAL_TABLET | Freq: Every day | ORAL | 3 refills | Status: DC

## 2018-07-03 MED ORDER — NITROGLYCERIN 0.4 MG SL SUBL: 0.4000 mg | SUBLINGUAL_TABLET | SUBLINGUAL | 3 refills | Status: DC | PRN

## 2018-07-03 MED ORDER — SUCRALFATE 1 GM/10ML PO SUSP: 1.0000 g | Freq: Four times a day (QID) | ORAL | 0 refills | Status: DC

## 2018-07-03 MED ORDER — MONTELUKAST SODIUM 10 MG PO TABS: 10.0000 mg | ORAL_TABLET | Freq: Every day | ORAL | 3 refills | Status: DC

## 2018-07-03 MED ORDER — PANTOPRAZOLE SODIUM 40 MG PO TBEC: 40.0000 mg | DELAYED_RELEASE_TABLET | Freq: Two times a day (BID) | ORAL | 0 refills | Status: DC

## 2018-07-03 NOTE — Progress Notes (Signed)
Patient Name: Lucas [name] Date of Birth: 07-15-1951 Date of Visit: 07/03/18 PCP: Steve Rattler, DO  Chief Complaint: hospital follow up and issues with medications   Subjective: Lucas [name] is a pleasant 67 y.o. year old with history significant for recent admission for acute anemia related to a duodenal ulcer, coronary artery disease with stable angina, tobacco use, alcohol use, and dyslipidemia presenting today for hospital follow-up.  Lucas [name] was recently admitted to the family medicine service.  He was admitted for chest pain related to coronary artery disease.  Cardiology was consulted.  He underwent both a stress test and a cardiac catheterization.  Cardiac catheterization was notable for significantly calcified multivessel disease.  Given his recent admission for a Mallory-Weiss status post clip placement and H. pylori infection he is not an optimal candidate for PCI given the need for dual antiplatelet therapy.  He was medically optimized and placed on guideline directed medical therapy.  He has initiated treatment for his H. Pylori.  In regards to his coronary disease he reports he has had no episodes of chest pain.  He is walking without issue.  He does have some dyspnea on exertion which is stable and unchanged from prior to admission.  He has all of his cardiac medications with him today including his Imdur his metoprolol and his aspirin.  He reports he is taking his rosuvastatin.  He does not have nitroglycerin at home.   In regards to his H. pylori infection Lucas [name] is tolerating his quadruple therapy.  He has not been able to pick up his proton pump inhibitor and thus has been not taking this twice daily.  Denies hematochezia or change in his stools.  He does have black stool secondary to bismuth use.  Denies symptoms of anemia including palpitations, dyspnea on exertion, or chest pain.  Lucas [name] reports he is no longer using alcohol.  His last drink was prior to entering the  hospital earlier this summer.  He reports no difficulty with quitting alcohol.  He reports he is interested in quitting smoking.  He is not interested in a medication today he is talking to someone about this and is considering it further.    ROS:  ROS As above  I have reviewed the patient's medical, surgical, family, and social history as appropriate.   Vitals:   07/03/18 0958  BP: 112/61  Pulse: 88  Temp: 97.9 F (36.6 C)  SpO2: 97%   Filed Weights   07/03/18 0958  Weight: 154 lb (69.9 kg)   Thin appearing man in no distress HEENT: Sclera anicteric. Dentition is moderate. Appears well hydrated. Neck: Supple Cardiac: Regular rate and rhythm. Normal S1/S2. No murmurs, rubs, or gallops appreciated. Lungs: Clear bilaterally to ascultation.  Abdomen: Normoactive bowel sounds. No tenderness to deep or light palpation. No rebound or guarding.  Extremities: Warm, well perfused without edema.  Skin: Warm, dry Psych: Pleasant and appropriate     Lucas [name] was seen today for hospitalization follow-up.  Diagnoses and all orders for this visit:  Encounter for hepatitis C screening test for low risk patient -     Hepatitis C Antibody  Chronic bronchitis, unspecified chronic bronchitis type ([place]) reviewed the use of his new inhalers -     montelukast (SINGULAIR) 10 MG tablet; Take 1 tablet (10 mg total) by mouth at bedtime.  Vitamin D deficiency suspect osteoporosis.  He has a history of multiple fractures.  DEXA scan has been ordered. Refilled today.  -  Cholecalciferol (VITAMIN D3) 400 units tablet; Take 2 tablets (800 Units total) by mouth daily.  H. pylori infection -     CBC with Differential -     Comprehensive metabolic panel  Chronic stable angina ([place]) he has had no episodes of chest pain since leaving the hospital discussed use of nitroglycerin including calling 911 if he refuses. -     nitroGLYCERIN (NITROSTAT) 0.4 MG SL tablet; Place 1 tablet (0.4 mg total) under the  tongue every 5 (five) minutes as needed for chest pain.  H. pylori infection and history of Mallory-Weiss tear -     sucralfate (CARAFATE) 1 GM/10ML suspension; Take 10 mLs (1 g total) by mouth 4 (four) times daily for 3 days. -     pantoprazole (PROTONIX) 40 MG tablet; Take 1 tablet (40 mg total) by mouth 2 (two) times daily.  Tobacco Cessation  He would like to consider Chantix when he sees Dr. Vanetta Shawl in the coming month.  He is worried about the side effect profile bupropion would also be an option for this patient.  He would like to discuss with his primary family physician.   Lucas Singh, MD  Family Medicine Teaching Service

## 2018-07-03 NOTE — Patient Instructions (Addendum)
Call EMS If you have chest pain not relieved by nitroglycerin.   Go to the lab today   It was wonderful to see you today.  Thank you for choosing [facility].   Please call [phone] with any questions about today's appointment.  Please be sure to schedule follow up at the front  desk before you leave today.   Dorris Singh, MD  Family Medicine

## 2018-07-04 ENCOUNTER — Encounter: Payer: Self-pay | Admitting: Family Medicine

## 2018-07-04 LAB — COMPREHENSIVE METABOLIC PANEL
ALBUMIN: 4.3 g/dL (ref 3.6–4.8)
ALT: 17 IU/L (ref 0–44)
AST: 24 IU/L (ref 0–40)
Albumin/Globulin Ratio: 1.5 (ref 1.2–2.2)
Alkaline Phosphatase: 83 IU/L (ref 39–117)
BUN / CREAT RATIO: 7 — AB (ref 10–24)
BUN: 6 mg/dL — AB (ref 8–27)
Bilirubin Total: 0.3 mg/dL (ref 0.0–1.2)
CO2: 24 mmol/L (ref 20–29)
CREATININE: 0.92 mg/dL (ref 0.76–1.27)
Calcium: 9.8 mg/dL (ref 8.6–10.2)
Chloride: 99 mmol/L (ref 96–106)
GFR calc non Af Amer: 85 mL/min/{1.73_m2} (ref >59)
GFR, EST AFRICAN AMERICAN: 98 mL/min/{1.73_m2} (ref >59)
GLUCOSE: 85 mg/dL (ref 65–99)
Globulin, Total: 2.9 g/dL (ref 1.5–4.5)
Potassium: 4.6 mmol/L (ref 3.5–5.2)
Sodium: 138 mmol/L (ref 134–144)
TOTAL PROTEIN: 7.2 g/dL (ref 6.0–8.5)

## 2018-07-04 LAB — CBC WITH DIFFERENTIAL/PLATELET
BASOS ABS: 0 10*3/uL (ref 0.0–0.2)
Basos: 0 %
EOS (ABSOLUTE): 0.1 10*3/uL (ref 0.0–0.4)
Eos: 2 %
HEMOGLOBIN: 10.7 g/dL — AB (ref 13.0–17.7)
Hematocrit: 31.8 % — ABNORMAL LOW (ref 37.5–51.0)
IMMATURE GRANS (ABS): 0 10*3/uL (ref 0.0–0.1)
Immature Granulocytes: 0 %
LYMPHS: 22 %
Lymphocytes Absolute: 1.1 10*3/uL (ref 0.7–3.1)
MCH: 32.2 pg (ref 26.6–33.0)
MCHC: 33.6 g/dL (ref 31.5–35.7)
MCV: 96 fL (ref 79–97)
MONOCYTES: 11 %
Monocytes Absolute: 0.6 10*3/uL (ref 0.1–0.9)
NEUTROS ABS: 3.2 10*3/uL (ref 1.4–7.0)
Neutrophils: 65 %
Platelets: 381 10*3/uL (ref 150–450)
RBC: 3.32 x10E6/uL — AB (ref 4.14–5.80)
RDW: 14.2 % (ref 12.3–15.4)
WBC: 4.9 10*3/uL (ref 3.4–10.8)

## 2018-07-04 LAB — HEPATITIS C ANTIBODY: Hep C Virus Ab: <0.1 s/co ratio (ref 0.0–0.9)

## 2018-07-09 ENCOUNTER — Ambulatory Visit: Payer: Medicare HMO | Admitting: Family Medicine

## 2018-07-09 NOTE — Progress Notes (Signed)
Cardiology Office Note:    Date:  07/11/2018   ID:  COHAN STIPES, DOB 20-Mar-1951, MRN [id number]  PCP:  Steve Rattler, DO  Cardiologist:  Peter Martinique, MD   Referring MD: Steve Rattler, DO   Chief Complaint  Patient presents with  . Hospitalization Follow-up    History of Present Illness:    Lucas [name] is a 67 y.o. male with no prior cardiac history, but hx of HTN, HLD, and ongoing tobacco abuse. He was recently discharged from hospital for hematemesis and found to have a Mack Guise tere on EGD on 06/22/18.  He presented to [facility] 06/24/18 with chest pain and ultimately underwent heart catheterization. Heart cath revealed 35% proximal RCA, 85% and 50% sequential lesions in mid RCA, 70% ostial left Cx, 65% left main/proximal LAD, and occluded D2. Case discussed with interventionalist Dr. Martinique. LAD and Cx not amenable to PCI and are not critical. The mid RCA lesion was more severe but would require atherectomy and stenting. He had no chest pain prior to his GI bleed and this chest pain was similar to what he had during his GI bleed. Given his ongoing alcohol abuse, Mallory Weiss tear, and medical noncompliance, decision was made to treat with aggressive medical therapy.   He presents today for hospital follow up.  Overall he is doing quite well.  He is having no bleeding problems.  He is maintained on aspirin, Imdur, Lopressor, Crestor.  He has not had to take any nitro since leaving the hospital.  He denies any further chest pain since discharge.  He sometimes becomes short of breath, this may be related to his underlying COPD.  He is having no lower extremity swelling.  He sees his PCP next week and will follow up with additional blood work at that time.  Past Medical History:  Diagnosis Date  . Acute blood loss anemia 06/21/2018  . Acute upper GI bleed 06/21/2018  . Allergy   . Bilateral AVN of femurs ([place]) 06/25/2018   See 11/2017 Chest CT report  . COPD 12/21/2006  . Environmental  allergies   . History of femur fracture 06/25/2018   11/2017 CT AP showed Remote nonunited right femur greater trochanter fracture  . History of Fracture of right tibia 06/26/2018  . History of multiple right thoacic rib fractures 06/25/2018  . History of Right clavicular fracture 06/25/2018  . HYPERLIPIDEMIA 12/21/2006  . HYPERTENSION, BENIGN SYSTEMIC 12/21/2006  . Mallory-Weiss tear   . TOBACCO DEPENDENCE 12/21/2006    Past Surgical History:  Procedure Laterality Date  . ANKLE SURGERY     left ankle  . APPLICATION OF WOUND VAC  01/06/2012   Procedure: APPLICATION OF WOUND VAC;  Surgeon: Meredith Pel, MD;  Location: [facility];  Service: Orthopedics;  Laterality: Left;  . APPLICATION OF WOUND VAC Right 10/17/2015   Procedure: APPLICATION OF WOUND VAC;  Surgeon: Leandrew Koyanagi, MD;  Location: [facility];  Service: Orthopedics;  Laterality: Right;  . BIOPSY  06/22/2018   Procedure: BIOPSY;  Surgeon: Dorothea Ogle v, DO;  Location: [facility];  Service: Gastroenterology;;  . ESOPHAGOGASTRODUODENOSCOPY (EGD) WITH PROPOFOL N/A 06/22/2018   Procedure: ESOPHAGOGASTRODUODENOSCOPY (EGD) WITH PROPOFOL;  Surgeon: [id number] v, DO;  Location: [facility];  Service: Gastroenterology;  Laterality: N/A;  . EXTERNAL FIXATION LEG  01/06/2012   Procedure: EXTERNAL FIXATION LEG;  Surgeon: Meredith Pel, MD;  Location: [facility];  Service: Orthopedics;  Laterality: Left;  Smith-nephew external fixator set  . EXTERNAL FIXATION LEG  Right 09/16/2015   Procedure: EXTERNAL FIXATION LEG/LARGE;  Surgeon: Mcarthur Rossetti, MD;  Location: [facility];  Service: Orthopedics;  Laterality: Right;  . EXTERNAL FIXATION REMOVAL Right 09/22/2015   Procedure: REMOVAL EXTERNAL FIXATION LEG;  Surgeon: Mcarthur Rossetti, MD;  Location: [facility];  Service: Orthopedics;  Laterality: Right;  . FASCIOTOMY  01/10/2012   Procedure: FASCIOTOMY;  Surgeon: Rozanna Box, MD;  Location: [facility];  Service: Orthopedics;  Laterality: Left;   status post fasciotomy with wound vac left calf; adjustment external fixator left tibia under fluoro; retention suture/wound vac placement left lateral calf wound  . FASCIOTOMY  01/06/2012   Procedure: FASCIOTOMY;  Surgeon: Meredith Pel, MD;  [facility]: [facility];  Service: Orthopedics;  Laterality: Left;  Four compartment fasciotomy  . HOT HEMOSTASIS N/A 06/22/2018   Procedure: HEMOSTASIS;  Surgeon: [id number] v, DO;  Location: [facility];  Service: Gastroenterology;  Laterality: N/A;  Epinepherine and clip placed  . I&D EXTREMITY  01/12/2012   Procedure: IRRIGATION AND DEBRIDEMENT EXTREMITY;  Surgeon: Rozanna Box, MD;  Location: [facility];  Service: Orthopedics;  Laterality: Left;  LAYERD CLOSURE LEFT LEG WOUND  . I&D EXTREMITY Right 10/17/2015   Procedure: IRRIGATION AND DEBRIDEMENT EXTREMITY;  Surgeon: Leandrew Koyanagi, MD;  Location: [facility];  Service: Orthopedics;  Laterality: Right;  . I&D EXTREMITY Right 10/20/2015   Procedure: REPEAT IRRIGATION AND DEBRIDEMENT EXTREMITY, right lower leg;  Surgeon: Mcarthur Rossetti, MD;  Location: [facility];  Service: Orthopedics;  Laterality: Right;  . KNEE SURGERY     left knee and right knee  . LEFT HEART CATH AND CORONARY ANGIOGRAPHY N/A 06/27/2018   Procedure: LEFT HEART CATH AND CORONARY ANGIOGRAPHY;  Surgeon: Martinique, Peter M, MD;  Location: [facility];  Service: Cardiovascular;  Laterality: N/A;  . MASS EXCISION Left 09/22/2015   Procedure: EXCISION MASS;  Surgeon: Mcarthur Rossetti, MD;  Location: [facility];  Service: Orthopedics;  Laterality: Left;  . ORIF TIBIA PLATEAU  02/07/2012   Procedure: OPEN REDUCTION INTERNAL FIXATION (ORIF) TIBIAL PLATEAU;  Surgeon: Rozanna Box, MD;  Location: [facility];  Service: Orthopedics;  Laterality: Left;  ORIF LEFT TIBIAL PLATEAU/ REMOVE EXTERNAL FIXATION LEFT LEG  . ORIF TIBIA PLATEAU Right 09/22/2015   Procedure: REMOVAL OF EXTERNAL FIXATOR RIGHT LEG, OPEN REDUCTION INTERNAL FIXATION (ORIF) RIGHT  TIBIAL PLATEAU, EXCISION SMALL MASS LEFT LEG;  Surgeon: Mcarthur Rossetti, MD;  Location: [facility];  Service: Orthopedics;  Laterality: Right;  . SKIN SPLIT GRAFT Right 10/23/2015   Procedure: RIGHT GASTROC FLAP SPLIT THICKNESS SKIN GRAFT FROM RIGHT THING TO RIGHT LEG;  Surgeon: Irene Limbo, MD;  Location: [facility];  Service: Plastics;  Laterality: Right;  . WRIST SURGERY     left    Current Medications: Current Meds  Medication Sig  . albuterol (PROVENTIL HFA;VENTOLIN HFA) 108 (90 Base) MCG/ACT inhaler INHALE 2 PUFFS INTO THE LUNGS EVERY 4 HOURS AS NEEDED FOR WHEEZING OR SHORTNESS OF BREATH  . aspirin 81 MG EC tablet Take 1 tablet (81 mg total) by mouth daily.  Marland Kitchen bismuth subsalicylate (PEPTO BISMOL) 262 MG chewable tablet Chew 2 tablets (524 mg total) by mouth 4 (four) times daily for 14 days.  . cetirizine (ZYRTEC) 10 MG tablet Take 1 tablet (10 mg total) by mouth daily.  . Cholecalciferol (VITAMIN D3) 400 units tablet Take 2 tablets (800 Units total) by mouth daily.  Marland Kitchen doxycycline (VIBRA-TABS) 100 MG tablet Take 1 tablet (100 mg total) by mouth 2 (  two) times daily for 14 days.  . fluticasone (FLONASE) 50 MCG/ACT nasal spray Place 2 sprays into both nostrils daily.  . Fluticasone-Umeclidin-Vilant (TRELEGY ELLIPTA) 100-62.5-25 MCG/INH AEPB Inhale 1 puff into the lungs daily for 28 days.  . isosorbide mononitrate (IMDUR) 30 MG 24 hr tablet Take 1 tablet (30 mg total) by mouth daily.  . metoprolol tartrate (LOPRESSOR) 25 MG tablet Take 1 tablet (25 mg total) by mouth 2 (two) times daily.  . metroNIDAZOLE (FLAGYL) 250 MG tablet Take 1 tablet (250 mg total) by mouth 2 (two) times daily for 14 days.  . montelukast (SINGULAIR) 10 MG tablet Take 1 tablet (10 mg total) by mouth at bedtime.  . nitroGLYCERIN (NITROSTAT) 0.4 MG SL tablet Place 1 tablet (0.4 mg total) under the tongue every 5 (five) minutes as needed for chest pain.  . pantoprazole (PROTONIX) 40 MG tablet Take 1 tablet (40 mg total)  by mouth 2 (two) times daily.  . rosuvastatin (CRESTOR) 20 MG tablet Take 1 tablet (20 mg total) by mouth daily.  . [DISCONTINUED] aspirin EC 81 MG EC tablet Take 1 tablet (81 mg total) by mouth daily.  . [DISCONTINUED] isosorbide mononitrate (IMDUR) 30 MG 24 hr tablet Take 1 tablet (30 mg total) by mouth daily.  . [DISCONTINUED] metoprolol tartrate (LOPRESSOR) 25 MG tablet Take 1 tablet (25 mg total) by mouth 2 (two) times daily.  . [DISCONTINUED] rosuvastatin (CRESTOR) 20 MG tablet Take 1 tablet (20 mg total) by mouth daily.     Allergies:   Ace inhibitors; Ampicillin; Penicillins; Codeine; and Lisinopril   Social History   Socioeconomic History  . Marital status: Married    Spouse name: Not on file  . Number of children: Not on file  . Years of education: Not on file  . Highest education level: Not on file  Occupational History  . Not on file  Social Needs  . Financial resource strain: Not on file  . Food insecurity:    Worry: Not on file    Inability: Not on file  . Transportation needs:    Medical: Not on file    Non-medical: Not on file  Tobacco Use  . Smoking status: Current Every Day Smoker    Packs/day: 0.50    Years: 51.00    Pack years: 25.50    Types: Cigarettes  . Smokeless tobacco: Former Systems developer    Types: Snuff, Sarina Ser    Quit date: 01/05/1965  Substance and Sexual Activity  . Alcohol use: Yes    Alcohol/week: 3.0 - 4.0 standard drinks    Types: 3 - 4 Cans of beer per week    Comment: 3-4 cans beer daily  . Drug use: No  . Sexual activity: Yes    Partners: Female  Lifestyle  . Physical activity:    Days per week: Not on file    Minutes per session: Not on file  . Stress: Not on file  Relationships  . Social connections:    Talks on phone: Not on file    Gets together: Not on file    Attends religious service: Not on file    Active member of club or organization: Not on file    Attends meetings of clubs or organizations: Not on file    Relationship  status: Not on file  Other Topics Concern  . Not on file  Social History Narrative  . Not on file     Family History: The patient's family history includes Emphysema in his  father.  ROS:   Please see the history of present illness.    All other systems reviewed and are negative.  EKGs/Labs/Other Studies Reviewed:    The following studies were reviewed today:  Left heart cath 06/27/18:  Prox RCA lesion is 35% stenosed.  Mid RCA-1 lesion is 85% stenosed.  Mid RCA-2 lesion is 50% stenosed.  Ost Cx to Prox Cx lesion is 70% stenosed.  Dist LM to Prox LAD lesion is 65% stenosed.  Mid LM to Dist LM lesion is 30% stenosed.  Ost 2nd Diag lesion is 100% stenosed.  The left ventricular systolic function is normal.  LV end diastolic pressure is mildly elevated.  The left ventricular ejection fraction is 55-65% by visual estimate.   1. 3 vessel CAD. Heavily calcified coronary arteries    - long 65% ostial to proximal LAD    - 100% second diagonal    - 70% ostial to proximal LCx    - 85% mid RCA 2. Normal LV function 3. Mildly elevated LVEDP  Plan: Mr. Mussa is not an ideal candidate for PCI. The LAD and LCx are not amenable to PCI but are not critical. The mid RCA lesion is more severe but would require atherectomy and stenting. He has normal LV function. It appears that prior to his acute GI bleed he was having rare angina. Given his co-morbidities and Etoh abuse I would recommend aggressive medical therapy for his CAD. We can monitor his anginal symptoms on medical therapy and make sure he will be compliant with lifestyle modifications, medical therapy and follow up. If he has limiting angina on good medical therapy we could consider focal atherectomy and stenting of the mid RCA or complete revascularization with CABG.    Echo 06/26/18: Study Conclusions - Left ventricle: The cavity size was normal. Systolic function was   normal. The estimated ejection fraction was in the  range of 55%   to 60%. Wall motion was normal; there were no regional wall   motion abnormalities. The study is not technically sufficient to   allow evaluation of LV diastolic function. - Aortic valve: Transvalvular velocity was within the normal range.   There was no significant stenosis. There was no regurgitation. - Mitral valve: Calcified annulus. There was mild regurgitation. - Left atrium: The atrium was mildly dilated. - Right ventricle: Systolic function was normal. - Atrial septum: No defect or patent foramen ovale was identified. - Tricuspid valve: There was trivial regurgitation. - Pulmonic valve: There was no significant regurgitation. - Pulmonary arteries: Systolic pressure was mildly increased. PA   peak pressure: 36 mm Hg (S).  Impressions: - Normal LV systolic function without regional wall motion   abnormalities. No significant valvular disease.    EKG:  EKG is not ordered today.     Recent Labs: 06/25/2018: Magnesium 2.1 07/03/2018: ALT 17; BUN 6; Creatinine, Ser 0.92; Hemoglobin 10.7; Platelets 381; Potassium 4.6; Sodium 138  Recent Lipid Panel    Component Value Date/Time   CHOL 96 06/27/2018 2344   TRIG 56 06/27/2018 2344   HDL 48 06/27/2018 2344   CHOLHDL 2.0 06/27/2018 2344   VLDL 11 06/27/2018 2344   LDLCALC 37 06/27/2018 2344   LDLDIRECT 105 (H) 05/16/2017 1528   LDLDIRECT 150 (H) 02/13/2013 1502    Physical Exam:    VS:  BP 136/60   Pulse 80   Resp 10   Ht 5\' 11"  (1.803 m)   Wt 150 lb 9.6 oz (68.3 kg)  BMI 21.00 kg/m     Wt Readings from Last 3 Encounters:  07/11/18 150 lb 9.6 oz (68.3 kg)  07/03/18 154 lb (69.9 kg)  06/28/18 159 lb 6.4 oz (72.3 kg)     GEN:  Well nourished, well developed in no acute distress HEENT: Normal NECK: No JVD; No carotid bruits CARDIAC: RRR, no murmurs, rubs, gallops RESPIRATORY:  Clear to auscultation without rales, wheezing or rhonchi  ABDOMEN: Soft, non-tender, non-distended MUSCULOSKELETAL:  No  edema; No deformity  SKIN: Warm and dry NEUROLOGIC:  Alert and oriented x 3 PSYCHIATRIC:  Normal affect   ASSESSMENT:    1. Coronary artery disease involving native coronary artery of native heart without angina pectoris   2. Pure hypercholesterolemia   3. TOBACCO DEPENDENCE   4. COPD with chronic bronchitis ([place])   5. Alcohol use disorder, severe, dependence ([place])    PLAN:    In order of problems listed above:  Coronary artery disease involving native coronary artery of native heart without angina pectoris Patient denies chest pain and has not used nitro since discharge.  He is overall doing quite well from a cardiac perspective.  He is maintained on aspirin, Imdur, Lopressor, and Crestor.  Essential hypertension Pressure is well controlled today, no medication changes.  Pure hypercholesterolemia 06/27/2018: Cholesterol 96; HDL 48; LDL Cholesterol 37; Triglycerides 56; VLDL 11  Continue Crestor.  TOBACCO DEPENDENCE COPD with chronic bronchitis ([place]) He has discussed starting Chantix with his PCP.  I encouraged him to try this.  Alcohol use disorder, severe, dependence ([facility]) No change.  Follow-up in 6 months.  Medication Adjustments/Labs and Tests Ordered: Current medicines are reviewed at length with the patient today.  Concerns regarding medicines are outlined above.  No orders of the defined types were placed in this encounter.  Meds ordered this encounter  Medications  . aspirin 81 MG EC tablet    Sig: Take 1 tablet (81 mg total) by mouth daily.    Dispense:  30 tablet    Refill:  0  . isosorbide mononitrate (IMDUR) 30 MG 24 hr tablet    Sig: Take 1 tablet (30 mg total) by mouth daily.    Dispense:  30 tablet    Refill:  0  . metoprolol tartrate (LOPRESSOR) 25 MG tablet    Sig: Take 1 tablet (25 mg total) by mouth 2 (two) times daily.    Dispense:  60 tablet    Refill:  0  . rosuvastatin (CRESTOR) 20 MG tablet    Sig: Take 1 tablet (20 mg total) by mouth  daily.    Dispense:  90 tablet    Refill:  3    Signed, Ledora Bottcher, [place]  07/11/2018 12:11 PM    [facility]

## 2018-07-11 ENCOUNTER — Encounter: Payer: Self-pay | Admitting: Physician Assistant

## 2018-07-11 ENCOUNTER — Ambulatory Visit (INDEPENDENT_AMBULATORY_CARE_PROVIDER_SITE_OTHER): Payer: Medicare HMO | Admitting: Physician Assistant

## 2018-07-11 VITALS — BP 136/60 | HR 80 | Resp 10 | Ht 71.0 in | Wt 150.6 lb

## 2018-07-11 DIAGNOSIS — F1020 Alcohol dependence, uncomplicated: Secondary | ICD-10-CM

## 2018-07-11 DIAGNOSIS — E7800 Pure hypercholesterolemia, unspecified: Secondary | ICD-10-CM | POA: Diagnosis not present

## 2018-07-11 DIAGNOSIS — F17200 Nicotine dependence, unspecified, uncomplicated: Secondary | ICD-10-CM | POA: Diagnosis not present

## 2018-07-11 DIAGNOSIS — J449 Chronic obstructive pulmonary disease, unspecified: Secondary | ICD-10-CM | POA: Diagnosis not present

## 2018-07-11 DIAGNOSIS — I2510 Atherosclerotic heart disease of native coronary artery without angina pectoris: Secondary | ICD-10-CM

## 2018-07-11 MED ORDER — ASPIRIN 81 MG PO TBEC: 81.0000 mg | DELAYED_RELEASE_TABLET | Freq: Every day | ORAL | 0 refills | Status: AC

## 2018-07-11 MED ORDER — METOPROLOL TARTRATE 25 MG PO TABS: 25.0000 mg | ORAL_TABLET | Freq: Two times a day (BID) | ORAL | 0 refills | Status: DC

## 2018-07-11 MED ORDER — ISOSORBIDE MONONITRATE ER 30 MG PO TB24: 30.0000 mg | ORAL_TABLET | Freq: Every day | ORAL | 0 refills | Status: DC

## 2018-07-11 MED ORDER — ROSUVASTATIN CALCIUM 20 MG PO TABS: 20.0000 mg | ORAL_TABLET | Freq: Every day | ORAL | 3 refills | Status: DC

## 2018-07-11 NOTE — Patient Instructions (Signed)
Medication Instructions:  Refills sent to pharmacy. Continue medication has prescribed. If you need a refill on your cardiac medications before your next appointment, please call your pharmacy.  Labwork: None Ordered.  Testing/Procedures: None Ordered.  Follow-Up: Your physician wants you to follow-up in: 6 months with Dr.Jordan. You should receive a reminder letter in the mail two months in advance. If you do not receive a letter, please call our office in December to schedule.   Thank you for choosing [facility] at [facility]!!

## 2018-07-19 ENCOUNTER — Other Ambulatory Visit: Payer: Self-pay | Admitting: Family Medicine

## 2018-11-19 ENCOUNTER — Other Ambulatory Visit: Payer: Self-pay | Admitting: Family Medicine

## 2018-11-19 DIAGNOSIS — J449 Chronic obstructive pulmonary disease, unspecified: Secondary | ICD-10-CM

## 2018-12-05 ENCOUNTER — Encounter: Payer: Self-pay | Admitting: Family Medicine

## 2018-12-05 ENCOUNTER — Other Ambulatory Visit: Payer: Self-pay

## 2018-12-05 ENCOUNTER — Ambulatory Visit (INDEPENDENT_AMBULATORY_CARE_PROVIDER_SITE_OTHER): Payer: Medicare Other | Admitting: Family Medicine

## 2018-12-05 VITALS — BP 140/72 | HR 114 | Temp 97.7°F | Wt 161.0 lb

## 2018-12-05 DIAGNOSIS — F17200 Nicotine dependence, unspecified, uncomplicated: Secondary | ICD-10-CM

## 2018-12-05 DIAGNOSIS — J449 Chronic obstructive pulmonary disease, unspecified: Secondary | ICD-10-CM

## 2018-12-05 DIAGNOSIS — I208 Other forms of angina pectoris: Secondary | ICD-10-CM

## 2018-12-05 MED ORDER — METOPROLOL TARTRATE 25 MG PO TABS: 25.0000 mg | ORAL_TABLET | Freq: Two times a day (BID) | ORAL | 2 refills | Status: DC

## 2018-12-05 MED ORDER — ALBUTEROL SULFATE HFA 108 (90 BASE) MCG/ACT IN AERS: 2.0000 | INHALATION_SPRAY | Freq: Four times a day (QID) | RESPIRATORY_TRACT | 6 refills | Status: DC | PRN

## 2018-12-05 MED ORDER — UMECLIDINIUM-VILANTEROL 62.5-25 MCG/INH IN AEPB: 1.0000 | INHALATION_SPRAY | Freq: Every day | RESPIRATORY_TRACT | 3 refills | Status: DC

## 2018-12-05 MED ORDER — NITROGLYCERIN 0.4 MG SL SUBL: 0.4000 mg | SUBLINGUAL_TABLET | SUBLINGUAL | 3 refills | Status: DC | PRN

## 2018-12-05 MED ORDER — ISOSORBIDE MONONITRATE ER 30 MG PO TB24: 30.0000 mg | ORAL_TABLET | Freq: Every day | ORAL | 2 refills | Status: DC

## 2018-12-05 NOTE — Patient Instructions (Signed)
Good to see you today!  You can take the ibuprofen 800 and tylenol 500 mg together twice a day for the knee pain. Don't forget to ice your knee after doing a lot of activity!  Please take the imdur (long acting nitroglycerin) every day Please take the metoprolol (heart medicine) twice a day  Start anoro inhaler The new albuterol inhaler is ready for you to pick up once you run out of the ventolin.  Please follow up with cardiology as scheduled.  Let me know if you would like to see surgeon to discuss knee replacement.  If you have questions or concerns please do not hesitate to call at [phone].  Lucila Maine, DO PGY-3, [facility] 12/05/2018 4:23 PM   Steps to Quit Smoking  Smoking tobacco can be bad for your health. It can also affect almost every organ in your body. Smoking puts you and people around you at risk for many serious long-lasting (chronic) diseases. Quitting smoking is hard, but it is one of the best things that you can do for your health. It is never too late to quit. What are the benefits of quitting smoking? When you quit smoking, you lower your risk for getting serious diseases and conditions. They can include:  Lung cancer or lung disease.  Heart disease.  Stroke.  Heart attack.  Not being able to have children (infertility).  Weak bones (osteoporosis) and broken bones (fractures). If you have coughing, wheezing, and shortness of breath, those symptoms may get better when you quit. You may also get sick less often. If you are pregnant, quitting smoking can help to lower your chances of having a baby of low birth weight. What can I do to help me quit smoking? Talk with your doctor about what can help you quit smoking. Some things you can do (strategies) include:  Quitting smoking totally, instead of slowly cutting back how much you smoke over a period of time.  Going to in-person counseling. You are more likely to quit if you go  to many counseling sessions.  Using resources and support systems, such as: ? Database administrator with a Social worker. ? Phone quitlines. ? Careers information officer. ? Support groups or group counseling. ? Text messaging programs. ? Mobile phone apps or applications.  Taking medicines. Some of these medicines may have nicotine in them. If you are pregnant or breastfeeding, do not take any medicines to quit smoking unless your doctor says it is okay. Talk with your doctor about counseling or other things that can help you. Talk with your doctor about using more than one strategy at the same time, such as taking medicines while you are also going to in-person counseling. This can help make quitting easier. What things can I do to make it easier to quit? Quitting smoking might feel very hard at first, but there is a lot that you can do to make it easier. Take these steps:  Talk to your family and friends. Ask them to support and encourage you.  Call phone quitlines, reach out to support groups, or work with a Social worker.  Ask people who smoke to not smoke around you.  Avoid places that make you want (trigger) to smoke, such as: ? Bars. ? Parties. ? Smoke-break areas at work.  Spend time with people who do not smoke.  Lower the stress in your life. Stress can make you want to smoke. Try these things to help your stress: ? Getting regular exercise. ?  Deep-breathing exercises. ? Yoga. ? Meditating. ? Doing a body scan. To do this, close your eyes, focus on one area of your body at a time from head to toe, and notice which parts of your body are tense. Try to relax the muscles in those areas.  Download or buy apps on your mobile phone or tablet that can help you stick to your quit plan. There are many free apps, such as QuitGuide from the [employer] Office manager for Disease Control and Prevention). You can find more support from smokefree.gov and other websites. This information is not intended to replace  advice given to you by your health care provider. Make sure you discuss any questions you have with your health care provider. Document Released: 08/06/2009 Document Revised: 06/07/2016 Document Reviewed: 02/24/2015 Elsevier Interactive Patient Education  2019 [employer].

## 2018-12-05 NOTE — Progress Notes (Signed)
    Subjective:    Patient ID: Lucas [name], male    DOB: Aug 25, 1951, 68 y.o.   MRN: [id number]   CC: patient here for physical exam, SOB  HPI: patient reports he is overall doing well. He has tried to cut down on drinking and smoking. He has had several falls related to alcohol intoxication and several broken bones. He has cut back to 2-3 beers on weekends. He has cut down to 5 cigarettes a day with goal of ultimately quitting entirely.   He has his medications with him for review. Notably he does not have any imdur or metoprolol with him. He states he has not had these medications in 4-5 months.  He reports shortness of breath with activities like walking long distances or walking up a hill. He will also have chest pain when he does these activities. He has had to take nitroglycerin several times in the past 4-5 months for chest pain related to exertion. He denies current chest pain or shortness of breath. He has not seen cardiology since September. He has appointment to see them coming up.   He has his incruse inhaler at home that he uses daily and his ventolin inhaler that he uses every 2-4 hours for SOB. He prefers ventolin over proair and feels it works better. He got a letter from his [employer] stating they will no longer cover ventolin and he is upset about this.   Smoking status reviewed- current smoker  Review of Systems- no fevers, chills, chest pain, SOB, PND, orthopnea, nausea, vomiting, diarrhea, constipation, rashes, recent illnesses, unintentional weight loss. Positive for DOE, chest pain with exertion   Objective:  BP 140/72   Pulse (!) 114   Temp 97.7 F (36.5 C) (Oral)   Wt 161 lb (73 kg)   SpO2 98%   BMI 22.45 kg/m  Vitals and nursing note reviewed  General: frail elderly gentleman in NAD HEENT: normocephalic, MMM Neck: supple, non-tender, without lymphadenopathy Cardiac: RRR, clear S1 and S2, no murmurs, rubs, or gallops Respiratory: clear to  auscultation bilaterally, no increased work of breathing Abdomen: soft, nontender +BS Extremities: thin extremities, no edema or cyanosis. Nicotine staining on fingers. Neuro: alert and oriented, no focal deficits   Assessment & Plan:    COPD with chronic bronchitis ([place])  Not well controlled on incruse alone. Will switch to anoro. Changing short acting bronchodilator to proair per pharmacy formulary. Follow up 6 months or sooner if needed.  Chronic stable angina [facility])  Patient was hospitalized in Aug 2019 with chest pain he has severe diffuse multivessel disease and was to be managed medically, however he has not taken imdur or betablocker in about 5 months. I have restarted these medications. New rx for SL nitro sent in as well. He has follow up scheduled with cardiology. He is taking statin and daily aspirin. ED precautions reviewed.  TOBACCO DEPENDENCE  Patient endorses interest in quitting, encouraged him and information given about steps to quit.     Return in about 6 months (around 06/05/2019).   Lucila Maine, DO Family Medicine Resident PGY-3

## 2018-12-06 NOTE — Assessment & Plan Note (Signed)
  Not well controlled on incruse alone. Will switch to anoro. Changing short acting bronchodilator to proair per pharmacy formulary. Follow up 6 months or sooner if needed.

## 2018-12-06 NOTE — Assessment & Plan Note (Addendum)
  Patient was hospitalized in Aug 2019 with chest pain he has severe diffuse multivessel disease and was to be managed medically, however he has not taken imdur or betablocker in about 5 months. I have restarted these medications. New rx for SL nitro sent in as well. He has follow up scheduled with cardiology. He is taking statin and daily aspirin. ED precautions reviewed.

## 2018-12-06 NOTE — Assessment & Plan Note (Signed)
  Patient endorses interest in quitting, encouraged him and information given about steps to quit.

## 2019-01-21 ENCOUNTER — Other Ambulatory Visit: Payer: Self-pay

## 2019-01-21 ENCOUNTER — Emergency Department (HOSPITAL_COMMUNITY)
Admission: EM | Admit: 2019-01-21 | Discharge: 2019-01-21 | Disposition: A | Discharge status: Home / Self Care | Payer: Medicare Other | Attending: Emergency Medicine | Admitting: Emergency Medicine

## 2019-01-21 ENCOUNTER — Emergency Department (HOSPITAL_COMMUNITY): Payer: Medicare Other

## 2019-01-21 ENCOUNTER — Encounter (HOSPITAL_COMMUNITY): Payer: Self-pay | Admitting: Emergency Medicine

## 2019-01-21 DIAGNOSIS — R0602 Shortness of breath: Secondary | ICD-10-CM | POA: Diagnosis not present

## 2019-01-21 DIAGNOSIS — R0689 Other abnormalities of breathing: Secondary | ICD-10-CM | POA: Diagnosis not present

## 2019-01-21 DIAGNOSIS — R062 Wheezing: Secondary | ICD-10-CM | POA: Diagnosis not present

## 2019-01-21 DIAGNOSIS — I10 Essential (primary) hypertension: Secondary | ICD-10-CM | POA: Diagnosis not present

## 2019-01-21 DIAGNOSIS — Z79899 Other long term (current) drug therapy: Secondary | ICD-10-CM | POA: Diagnosis not present

## 2019-01-21 DIAGNOSIS — I2510 Atherosclerotic heart disease of native coronary artery without angina pectoris: Secondary | ICD-10-CM | POA: Insufficient documentation

## 2019-01-21 DIAGNOSIS — R7309 Other abnormal glucose: Secondary | ICD-10-CM | POA: Diagnosis not present

## 2019-01-21 DIAGNOSIS — J80 Acute respiratory distress syndrome: Secondary | ICD-10-CM | POA: Diagnosis not present

## 2019-01-21 DIAGNOSIS — R000 Tachycardia, unspecified: Secondary | ICD-10-CM | POA: Diagnosis not present

## 2019-01-21 DIAGNOSIS — J441 Chronic obstructive pulmonary disease with (acute) exacerbation: Secondary | ICD-10-CM | POA: Diagnosis not present

## 2019-01-21 DIAGNOSIS — F17210 Nicotine dependence, cigarettes, uncomplicated: Secondary | ICD-10-CM | POA: Diagnosis not present

## 2019-01-21 LAB — BASIC METABOLIC PANEL
Anion gap: 15 (ref 5–15)
BUN: 7 mg/dL — ABNORMAL LOW (ref 8–23)
CO2: 26 mmol/L (ref 22–32)
Calcium: 9.7 mg/dL (ref 8.9–10.3)
Chloride: 98 mmol/L (ref 98–111)
Creatinine, Ser: 1.1 mg/dL (ref 0.61–1.24)
GFR calc Af Amer: >60 mL/min (ref >60)
GFR calc non Af Amer: >60 mL/min (ref >60)
GLUCOSE: 186 mg/dL — AB (ref 70–99)
Potassium: 4 mmol/L (ref 3.5–5.1)
Sodium: 139 mmol/L (ref 135–145)

## 2019-01-21 LAB — CBC WITH DIFFERENTIAL/PLATELET
Abs Immature Granulocytes: 0.04 10*3/uL (ref 0.00–0.07)
Basophils Absolute: 0 10*3/uL (ref 0.0–0.1)
Basophils Relative: 0 %
Eosinophils Absolute: 0.1 10*3/uL (ref 0.0–0.5)
Eosinophils Relative: 1 %
HCT: 43.3 % (ref 39.0–52.0)
Hemoglobin: 13.7 g/dL (ref 13.0–17.0)
Immature Granulocytes: 1 %
LYMPHS PCT: 26 %
Lymphs Abs: 2.3 10*3/uL (ref 0.7–4.0)
MCH: 30.9 pg (ref 26.0–34.0)
MCHC: 31.6 g/dL (ref 30.0–36.0)
MCV: 97.5 fL (ref 80.0–100.0)
Monocytes Absolute: 0.7 10*3/uL (ref 0.1–1.0)
Monocytes Relative: 9 %
Neutro Abs: 5.5 10*3/uL (ref 1.7–7.7)
Neutrophils Relative %: 63 %
Platelets: 208 10*3/uL (ref 150–400)
RBC: 4.44 MIL/uL (ref 4.22–5.81)
RDW: 13.2 % (ref 11.5–15.5)
WBC: 8.7 10*3/uL (ref 4.0–10.5)
nRBC: 0 % (ref 0.0–0.2)

## 2019-01-21 MED ORDER — IPRATROPIUM-ALBUTEROL 20-100 MCG/ACT IN AERS
2.0000 | INHALATION_SPRAY | Freq: Once | RESPIRATORY_TRACT | Status: AC
  Administered 2019-01-21: 2 via RESPIRATORY_TRACT
  Filled 2019-01-21: qty 4

## 2019-01-21 MED ORDER — ALBUTEROL SULFATE HFA 108 (90 BASE) MCG/ACT IN AERS
2.0000 | INHALATION_SPRAY | Freq: Once | RESPIRATORY_TRACT | Status: AC
  Administered 2019-01-21: 2 via RESPIRATORY_TRACT
  Filled 2019-01-21: qty 6.7

## 2019-01-21 MED ORDER — PREDNISONE 20 MG PO TABS: ORAL_TABLET | ORAL | 0 refills | Status: DC

## 2019-01-21 MED ORDER — IPRATROPIUM-ALBUTEROL 0.5-2.5 (3) MG/3ML IN SOLN
3.0000 mL | Freq: Once | RESPIRATORY_TRACT | Status: AC
  Administered 2019-01-21: 3 mL via RESPIRATORY_TRACT
  Filled 2019-01-21: qty 3

## 2019-01-21 NOTE — ED Provider Notes (Signed)
Patient care transferred to me.  His chest x-ray and labs are benign.  He has maintain oxygen saturations above 92% at rest and then when ambulating.  He felt a little short of breath when walking but he feels well enough to go home and would like to go home.  I discussed that at this time we should assume he might have COVID-19 and he should self quarantine.  He has been given steroid prescription.  He will be given albuterol inhaler here to take home.  Discussed return precautions.   Sherwood Gambler, MD 01/21/19 [id number]

## 2019-01-21 NOTE — ED Triage Notes (Signed)
Sudden onset of SOB, hx of COPD, CHF.  Initial appearance, tripod w/ labored breath.  Given 2 treatments of albuterol (10mg ), 125 solumedrol, 2g mag, Atrovent .5mg .  Breathing is much better, able to talk in complete sentences.  No fever, no other risk factors.

## 2019-01-21 NOTE — Discharge Instructions (Addendum)
If you develop high fever, trouble breathing, coughing up blood, chest pain, or any other new/concerning symptoms then return to the ER for evaluation.  We are asking you to quarantine yourself for the next 7 days and at least having 3 days free of symptoms.  Use the albuterol inhaler 2 puffs every 4 hours for cough or shortness of breath. Start the prednsione tomorrow.      Person Under Monitoring Name: Lucas [name]  Location: [address] [address]   Infection Prevention Recommendations for Individuals Confirmed to have, or Being Evaluated for, 2019 Novel Coronavirus (COVID-19) Infection Who Receive Care at Home  Individuals who are confirmed to have, or are being evaluated for, COVID-19 should follow the prevention steps below until a healthcare provider or local or state health department says they can return to normal activities.  Stay home except to get medical care You should restrict activities outside your home, except for getting medical care. Do not go to work, school, or public areas, and do not use public transportation or taxis.  Call ahead before visiting your doctor Before your medical appointment, call the healthcare provider and tell them that you have, or are being evaluated for, COVID-19 infection. This will help the healthcare providers office take steps to keep other people from getting infected. Ask your healthcare provider to call the local or state health department.  Monitor your symptoms Seek prompt medical attention if your illness is worsening (e.g., difficulty breathing). Before going to your medical appointment, call the healthcare provider and tell them that you have, or are being evaluated for, COVID-19 infection. Ask your healthcare provider to call the local or state health department.  Wear a facemask You should wear a facemask that covers your nose and mouth when you are in the same room with other people and when you  visit a healthcare provider. People who live with or visit you should also wear a facemask while they are in the same room with you.  Separate yourself from other people in your home As much as possible, you should stay in a different room from other people in your home. Also, you should use a separate bathroom, if available.  Avoid sharing household items You should not share dishes, drinking glasses, cups, eating utensils, towels, bedding, or other items with other people in your home. After using these items, you should wash them thoroughly with soap and water.  Cover your coughs and sneezes Cover your mouth and nose with a tissue when you cough or sneeze, or you can cough or sneeze into your sleeve. Throw used tissues in a lined trash can, and immediately wash your hands with soap and water for at least 20 seconds or use an alcohol-based hand rub.  Wash your [facility] your hands often and thoroughly with soap and water for at least 20 seconds. You can use an alcohol-based hand sanitizer if soap and water are not available and if your hands are not visibly dirty. Avoid touching your eyes, nose, and mouth with unwashed hands.   Prevention Steps for Caregivers and Household Members of Individuals Confirmed to have, or Being Evaluated for, COVID-19 Infection Being Cared for in the Home  If you live with, or provide care at home for, a person confirmed to have, or being evaluated for, COVID-19 infection please follow these guidelines to prevent infection:  Follow healthcare providers instructions Make sure that you understand and can help the patient follow any  healthcare provider instructions for all care.  Provide for the patients basic needs You should help the patient with basic needs in the home and provide support for getting groceries, prescriptions, and other personal needs.  Monitor the patients symptoms If they are getting sicker, call his or her medical provider and  tell them that the patient has, or is being evaluated for, COVID-19 infection. This will help the healthcare providers office take steps to keep other people from getting infected. Ask the healthcare provider to call the local or state health department.  Limit the number of people who have contact with the patient If possible, have only one caregiver for the patient. Other household members should stay in another home or place of residence. If this is not possible, they should stay in another room, or be separated from the patient as much as possible. Use a separate bathroom, if available. Restrict visitors who do not have an essential need to be in the home.  Keep older adults, very young children, and other sick people away from the patient Keep older adults, very young children, and those who have compromised immune systems or chronic health conditions away from the patient. This includes people with chronic heart, lung, or kidney conditions, diabetes, and cancer.  Ensure good ventilation Make sure that shared spaces in the home have good air flow, such as from an air conditioner or an opened window, weather permitting.  Wash your hands often Wash your hands often and thoroughly with soap and water for at least 20 seconds. You can use an alcohol based hand sanitizer if soap and water are not available and if your hands are not visibly dirty. Avoid touching your eyes, nose, and mouth with unwashed hands. Use disposable paper towels to dry your hands. If not available, use dedicated cloth towels and replace them when they become wet.  Wear a facemask and gloves Wear a disposable facemask at all times in the room and gloves when you touch or have contact with the patients blood, body fluids, and/or secretions or excretions, such as sweat, saliva, sputum, nasal mucus, vomit, urine, or feces.  Ensure the mask fits over your nose and mouth tightly, and do not touch it during use. Throw out  disposable facemasks and gloves after using them. Do not reuse. Wash your hands immediately after removing your facemask and gloves. If your personal clothing becomes contaminated, carefully remove clothing and launder. Wash your hands after handling contaminated clothing. Place all used disposable facemasks, gloves, and other waste in a lined container before disposing them with other household waste. Remove gloves and wash your hands immediately after handling these items.  Do not share dishes, glasses, or other household items with the patient Avoid sharing household items. You should not share dishes, drinking glasses, cups, eating utensils, towels, bedding, or other items with a patient who is confirmed to have, or being evaluated for, COVID-19 infection. After the person uses these items, you should wash them thoroughly with soap and water.  Wash laundry thoroughly Immediately remove and wash clothes or bedding that have blood, body fluids, and/or secretions or excretions, such as sweat, saliva, sputum, nasal mucus, vomit, urine, or feces, on them. Wear gloves when handling laundry from the patient. Read and follow directions on labels of laundry or clothing items and detergent. In general, wash and dry with the warmest temperatures recommended on the label.  Clean all areas the individual has used often Clean all touchable surfaces, such as counters, tabletops,  doorknobs, bathroom fixtures, toilets, phones, keyboards, tablets, and bedside tables, every day. Also, clean any surfaces that may have blood, body fluids, and/or secretions or excretions on them. Wear gloves when cleaning surfaces the patient has come in contact with. Use a diluted bleach solution (e.g., dilute bleach with 1 part bleach and 10 parts water) or a household disinfectant with a label that says EPA-registered for coronaviruses. To make a bleach solution at home, add 1 tablespoon of bleach to 1 quart (4 cups) of water.  For a larger supply, add  cup of bleach to 1 gallon (16 cups) of water. Read labels of cleaning products and follow recommendations provided on product labels. Labels contain instructions for safe and effective use of the cleaning product including precautions you should take when applying the product, such as wearing gloves or eye protection and making sure you have good ventilation during use of the product. Remove gloves and wash hands immediately after cleaning.  Monitor yourself for signs and symptoms of illness Caregivers and household members are considered close contacts, should monitor their health, and will be asked to limit movement outside of the home to the extent possible. Follow the monitoring steps for close contacts listed on the symptom monitoring form.   ? If you have additional questions, contact your local health department or call the epidemiologist on call at [phone] (available 24/7). ? This guidance is subject to change. For the most up-to-date guidance from [facility], please refer to their website: [email or web address]

## 2019-01-21 NOTE — ED Notes (Signed)
Patient verbalizes understanding of discharge instructions. Opportunity for questioning and answers were provided. Armband removed by staff, pt discharged from ED.  

## 2019-01-21 NOTE — ED Provider Notes (Signed)
[facility] EMERGENCY DEPARTMENT Provider Note   CSN: [id number] Arrival date & time:       History   Chief Complaint Chief Complaint  Patient presents with  . Shortness of Breath    HPI Lucas [name] is a 68 y.o. male.  The history is provided by the patient.  He has history of hypertension, hyperlipidemia, COPD and comes in because of difficulty breathing which started about 5 AM.  He has had a mild cough which is productive of a small amount of sputum which she swallows.  He denies fever or chills but has had some sweats.  He denies chest pain, heaviness, tightness, pressure.  There has been no nausea or vomiting.  He denies arthralgias or myalgias.  He denies any recent travel and denies any sick contacts and denies contact with anyone else was had recent travel.  Past Medical History:  Diagnosis Date  . Acute blood loss anemia 06/21/2018  . Acute upper GI bleed 06/21/2018  . Allergy   . Bilateral AVN of femurs ([place]) 06/25/2018   See 11/2017 Chest CT report  . COPD 12/21/2006  . Environmental allergies   . History of femur fracture 06/25/2018   11/2017 CT AP showed Remote nonunited right femur greater trochanter fracture  . History of Fracture of right tibia 06/26/2018  . History of multiple right thoacic rib fractures 06/25/2018  . History of Right clavicular fracture 06/25/2018  . HYPERLIPIDEMIA 12/21/2006  . HYPERTENSION, BENIGN SYSTEMIC 12/21/2006  . Mallory-Weiss tear   . TOBACCO DEPENDENCE 12/21/2006    Patient Active Problem List   Diagnosis Date Noted  . CAD (coronary artery disease) 07/11/2018  . H. pylori infection 07/03/2018  . Chronic stable angina ([place]) 07/03/2018  . Duodenal ulcer due to Helicobacter pylori   . History of Right clavicular fracture 06/25/2018  . Bilateral AVN of femurs ([place]) 06/25/2018  . Chronic cough 06/25/2018  . Mallory-Weiss tear   . Gastritis and gastroduodenitis   . Cirrhosis of liver ([place])   . Tubular adenoma of colon  01/24/2017  . Ganglion cyst 01/03/2012  . PERIPHERAL NEUROPATHY 07/15/2010  . Alcohol use disorder, severe, dependence ([place]) 05/14/2009  . DEFORMITY, FOOT NEC, CONGENITAL 08/22/2007  . ALLERGIC RHINITIS, SEASONAL 05/03/2007  . Hyperlipidemia 12/21/2006  . TOBACCO DEPENDENCE 12/21/2006  . COPD with chronic bronchitis ([place]) 12/21/2006    Past Surgical History:  Procedure Laterality Date  . ANKLE SURGERY     left ankle  . APPLICATION OF WOUND VAC  01/06/2012   Procedure: APPLICATION OF WOUND VAC;  Surgeon: Meredith Pel, MD;  Location: [facility];  Service: Orthopedics;  Laterality: Left;  . APPLICATION OF WOUND VAC Right 10/17/2015   Procedure: APPLICATION OF WOUND VAC;  Surgeon: Leandrew Koyanagi, MD;  Location: [facility];  Service: Orthopedics;  Laterality: Right;  . BIOPSY  06/22/2018   Procedure: BIOPSY;  Surgeon: Dorothea Ogle v, DO;  Location: [facility];  Service: Gastroenterology;;  . ESOPHAGOGASTRODUODENOSCOPY (EGD) WITH PROPOFOL N/A 06/22/2018   Procedure: ESOPHAGOGASTRODUODENOSCOPY (EGD) WITH PROPOFOL;  Surgeon: [id number] v, DO;  Location: [facility];  Service: Gastroenterology;  Laterality: N/A;  . EXTERNAL FIXATION LEG  01/06/2012   Procedure: EXTERNAL FIXATION LEG;  Surgeon: Meredith Pel, MD;  Location: [facility];  Service: Orthopedics;  Laterality: Left;  Smith-nephew external fixator set  . EXTERNAL FIXATION LEG Right 09/16/2015   Procedure: EXTERNAL FIXATION LEG/LARGE;  Surgeon: Mcarthur Rossetti, MD;  Location: [facility];  Service: Orthopedics;  Laterality: Right;  .  EXTERNAL FIXATION REMOVAL Right 09/22/2015   Procedure: REMOVAL EXTERNAL FIXATION LEG;  Surgeon: Mcarthur Rossetti, MD;  Location: [facility];  Service: Orthopedics;  Laterality: Right;  . FASCIOTOMY  01/10/2012   Procedure: FASCIOTOMY;  Surgeon: Rozanna Box, MD;  Location: [facility];  Service: Orthopedics;  Laterality: Left;  status post fasciotomy with wound vac left calf; adjustment external fixator  left tibia under fluoro; retention suture/wound vac placement left lateral calf wound  . FASCIOTOMY  01/06/2012   Procedure: FASCIOTOMY;  Surgeon: Meredith Pel, MD;  Location: [facility];  Service: Orthopedics;  Laterality: Left;  Four compartment fasciotomy  . HOT HEMOSTASIS N/A 06/22/2018   Procedure: HEMOSTASIS;  Surgeon: [id number] v, DO;  Location: [facility];  Service: Gastroenterology;  Laterality: N/A;  Epinepherine and clip placed  . I&D EXTREMITY  01/12/2012   Procedure: IRRIGATION AND DEBRIDEMENT EXTREMITY;  Surgeon: Rozanna Box, MD;  Location: [facility];  Service: Orthopedics;  Laterality: Left;  LAYERD CLOSURE LEFT LEG WOUND  . I&D EXTREMITY Right 10/17/2015   Procedure: IRRIGATION AND DEBRIDEMENT EXTREMITY;  Surgeon: Leandrew Koyanagi, MD;  Location: [place] [facility];  Service: Orthopedics;  Laterality: Right;  . I&D EXTREMITY Right 10/20/2015   Procedure: REPEAT IRRIGATION AND DEBRIDEMENT EXTREMITY, right lower leg;  Surgeon: Mcarthur Rossetti, MD;  Location: [facility];  Service: Orthopedics;  Laterality: Right;  . KNEE SURGERY     left knee and right knee  . LEFT HEART CATH AND CORONARY ANGIOGRAPHY N/A 06/27/2018   Procedure: LEFT HEART CATH AND CORONARY ANGIOGRAPHY;  Surgeon: Martinique, Peter M, MD;  Location: [facility];  Service: Cardiovascular;  Laterality: N/A;  . MASS EXCISION Left 09/22/2015   Procedure: EXCISION MASS;  Surgeon: Mcarthur Rossetti, MD;  Location: [facility];  Service: Orthopedics;  Laterality: Left;  . ORIF TIBIA PLATEAU  02/07/2012   Procedure: OPEN REDUCTION INTERNAL FIXATION (ORIF) TIBIAL PLATEAU;  Surgeon: Rozanna Box, MD;  Location: [facility];  Service: Orthopedics;  Laterality: Left;  ORIF LEFT TIBIAL PLATEAU/ REMOVE EXTERNAL FIXATION LEFT LEG  . ORIF TIBIA PLATEAU Right 09/22/2015   Procedure: REMOVAL OF EXTERNAL FIXATOR RIGHT LEG, OPEN REDUCTION INTERNAL FIXATION (ORIF) RIGHT TIBIAL PLATEAU, EXCISION SMALL MASS LEFT LEG;  Surgeon: Mcarthur Rossetti,  MD;  Location: [facility];  Service: Orthopedics;  Laterality: Right;  . SKIN SPLIT GRAFT Right 10/23/2015   Procedure: RIGHT GASTROC FLAP SPLIT THICKNESS SKIN GRAFT FROM RIGHT THING TO RIGHT LEG;  Surgeon: Irene Limbo, MD;  Location: [place] [facility];  Service: Plastics;  Laterality: Right;  . WRIST SURGERY     left        Home Medications    Prior to Admission medications   Medication Sig Start Date End Date Taking? Authorizing Provider  albuterol (PROAIR HFA) 108 (90 Base) MCG/ACT inhaler Inhale 2 puffs into the lungs every 6 (six) hours as needed for wheezing or shortness of breath. 12/05/18   Steve Rattler, DO  cetirizine (ZYRTEC) 10 MG tablet Take 1 tablet (10 mg total) by mouth daily. 02/07/18   Steve Rattler, DO  Cholecalciferol (VITAMIN D3) 400 units tablet Take 2 tablets (800 Units total) by mouth daily. 07/03/18   Martyn Malay, MD  fluticasone [facility]) 50 MCG/ACT nasal spray SPRAY 2 SPRAYS INTO EACH NOSTRIL EVERY DAY 07/19/18   Lucila Maine C, DO  isosorbide mononitrate (IMDUR) 30 MG 24 hr tablet Take 1 tablet (30 mg total) by mouth daily. 12/05/18   Steve Rattler, DO  metoprolol tartrate (LOPRESSOR) 25 MG tablet Take 1 tablet (25 mg total) by mouth 2 (two) times daily for 30 days. 12/05/18 01/04/19  Steve Rattler, DO  montelukast (SINGULAIR) 10 MG tablet Take 1 tablet (10 mg total) by mouth at bedtime. 07/03/18   Martyn Malay, MD  nitroGLYCERIN (NITROSTAT) 0.4 MG SL tablet Place 1 tablet (0.4 mg total) under the tongue every 5 (five) minutes as needed for chest pain. 12/05/18   Steve Rattler, DO  pantoprazole (PROTONIX) 40 MG tablet Take 1 tablet (40 mg total) by mouth 2 (two) times daily. 07/03/18 08/26/18  Martyn Malay, MD  rosuvastatin (CRESTOR) 20 MG tablet Take 1 tablet (20 mg total) by mouth daily. 07/11/18   Duke, Tami Lin, PA  sucralfate (CARAFATE) 1 GM/10ML suspension Take 10 mLs (1 g total) by mouth 4 (four) times daily for 3 days. 07/03/18 07/06/18  Martyn Malay, MD  umeclidinium-vilanterol (ANORO ELLIPTA) 62.5-25 MCG/INH AEPB Inhale 1 puff into the lungs daily. 12/05/18   Steve Rattler, DO  loratadine (CLARITIN) 10 MG tablet Take 1 tablet (10 mg total) by mouth daily. 03/04/11 05/23/18  Mariana Arn, MD    Family History Family History  Problem Relation Age of Onset  . Emphysema Father     Social History Social History   Tobacco Use  . Smoking status: Current Every Day Smoker    Packs/day: 0.50    Years: 51.00    Pack years: 25.50    Types: Cigarettes  . Smokeless tobacco: Former User    Types: Snuff, Sarina Ser    Quit date: 01/05/1965  Substance Use Topics  . Alcohol use: Yes    Alcohol/week: 3.0 - 4.0 standard drinks    Types: 3 - 4 Cans of beer per week    Comment: 3-4 cans beer daily  . Drug use: No     Allergies   Ace inhibitors; Ampicillin; Penicillins; Codeine; and Lisinopril   Review of Systems Review of Systems  All other systems reviewed and are negative.    Physical Exam Updated Vital Signs BP (!) 146/81 (BP Location: Right Arm)   Pulse (!) 102   Temp 97.8 F (36.6 C) (Oral)   Resp (!) 26   Ht 5\' 11"  (1.803 m)   Wt 72.6 kg   SpO2 99%   BMI 22.32 kg/m   Physical Exam Vitals signs and nursing note reviewed.    68 year old male, appears dyspneic at rest, but is in no acute distress. Vital signs are significant for elevated blood pressure, respiratory rate, heart rate. Oxygen saturation is 99%, which is normal. Head is normocephalic and atraumatic. PERRLA, EOMI. Oropharynx is clear. Neck is nontender and supple without adenopathy or JVD. Back is nontender and there is no CVA tenderness. Lungs have diffuse expiratory wheezes without rales or rhonchi. Chest is nontender. Heart has regular rate and rhythm without murmur. Abdomen is soft, flat, nontender without masses or hepatosplenomegaly and peristalsis is normoactive. Extremities have no cyanosis or edema, full range of motion is present. Skin is warm  and dry without rash. Neurologic: Mental status is normal, cranial nerves are intact, there are no motor or sensory deficits.  ED Treatments / Results  Labs (all labs ordered are listed, but only abnormal results are displayed) Labs Reviewed  BASIC METABOLIC PANEL - Abnormal; Notable for the following components:      Result Value   Glucose, Bld 186 (*)    BUN 7 (*)  All other components within normal limits  CBC WITH DIFFERENTIAL/PLATELET    EKG EKG Interpretation  Date/Time:  Monday January 21 2019 06:21:51 EDT Ventricular Rate:  100 PR Interval:    QRS Duration: 94 QT Interval:  357 QTC Calculation: 461 R Axis:   72 Text Interpretation:  Sinus tachycardia Consider left atrial enlargement Left ventricular hypertrophy Nonspecific T abnormalities, lateral leads When compared with ECG of 06/26/2018, ST abnormalits has improved Confirmed by Delora Fuel ([id number]) on 01/21/2019 6:28:22 AM   Radiology Dg Chest Port 1 View  Result Date: 01/21/2019 CLINICAL DATA:  Sudden onset shortness of breath EXAM: PORTABLE CHEST 1 VIEW COMPARISON:  06/24/2018 FINDINGS: Normal heart size and mediastinal contours. Costophrenic sulcus blunting on the right that is attributed to scarring. Multiple remote right rib fractures. There is no edema, consolidation, effusion, or pneumothorax. IMPRESSION: Stable from prior.  No acute finding Electronically Signed   By: Monte Fantasia M.D.   On: 01/21/2019 06:58    Procedures Procedures   Medications Ordered in ED Medications  Ipratropium-Albuterol (COMBIVENT) respimat 2 puff (2 puffs Inhalation Given 01/21/19 0743)  ipratropium-albuterol (DUONEB) 0.5-2.5 (3) MG/3ML nebulizer solution 3 mL (3 mLs Nebulization Given 01/21/19 0744)     Initial Impression / Assessment and Plan / ED Course  I have reviewed the triage vital signs and the nursing notes.  Pertinent labs & imaging results that were available during my care of the patient were reviewed by me and  considered in my medical decision making (see chart for details).  Lucas [name] was evaluated in Emergency Department on 01/21/2019 for the symptoms described in the history of present illness. He was evaluated in the context of the global COVID-19 pandemic, which necessitated consideration that the patient might be at risk for infection with the SARS-CoV-2 virus that causes COVID-19. Institutional protocols and algorithms that pertain to the evaluation of patients at risk for COVID-19 are in a state of rapid change based on information released by regulatory bodies including the CDC and federal and state organizations. These policies and algorithms were followed during the patient's care in the ED.   COPD exacerbation.  Will check chest x-ray to look for evidence of pneumonia.  He arrived by ambulance.  He received methylprednisolone, magnesium, nebulized albuterol which did give some temporary relief but he has gotten worse since arriving in the ED.  Old records are reviewed, and he does have a prior ED visit for COPD exacerbation, no hospitalizations for COPD.  He will be given additional albuterol with ipratropium.  Will check chest x-ray and screening labs.  ECG shows no acute changes, and improvement of ST depression that has been present on prior ECG.  Chest x-ray shows small right pleural effusion, no change from prior x-ray.  On reexam, he was noted to be breathing somewhat easier, but still tachypneic.  Labs are unremarkable, including normal WBC.  He is noted to have an elevated glucose and this will need to be followed as an outpatient.  Oxygen saturation had dropped to 89% on room air and he is started on supplemental nasal oxygen.  Nebulizer treatment with albuterol and ipratropium has been ordered.  Case is signed out to Dr. Regenia Skeeter.  Final Clinical Impressions(s) / ED Diagnoses   Final diagnoses:  COPD exacerbation ([place])  Elevated glucose    ED Discharge Orders    None        Delora Fuel, MD [birth year] [phone]

## 2019-01-21 NOTE — ED Notes (Signed)
Pt ambulated up and down the hallway with ease. Pt's pulse oximetry stayed above 94% throughout the walk. Pt stated he did feel SOB during and after the walk.

## 2019-01-25 ENCOUNTER — Telehealth (INDEPENDENT_AMBULATORY_CARE_PROVIDER_SITE_OTHER): Payer: Medicare Other | Admitting: Family Medicine

## 2019-01-25 ENCOUNTER — Other Ambulatory Visit: Payer: Self-pay

## 2019-01-25 DIAGNOSIS — I208 Other forms of angina pectoris: Secondary | ICD-10-CM

## 2019-01-25 DIAGNOSIS — J449 Chronic obstructive pulmonary disease, unspecified: Secondary | ICD-10-CM

## 2019-01-25 MED ORDER — NITROGLYCERIN 0.4 MG SL SUBL: 0.4000 mg | SUBLINGUAL_TABLET | SUBLINGUAL | 3 refills | Status: DC | PRN

## 2019-01-25 NOTE — Assessment & Plan Note (Signed)
Patient had exacerbation but did not require hospitalization. Unclear as to what caused this attack but since he is responding to prednisone I would not make any changes to his regimen at this time. Could consider getting CBC w/Diff and if Eosinophil count is low transition from [facility] to [facility] for control. This would also lower his risk for developing a PNA. - Cont prednisone - Cont Albuterol prn - Cont Anoro Ellipta

## 2019-01-25 NOTE — Progress Notes (Signed)
[facility] Telemedicine Visit  Patient consented to have visit conducted via telephone.  Encounter participants: Patient: Lucas [name]  Provider: Nuala Alpha  Others (if applicable): none  Chief Complaint: ED visit Follow Up  HPI: Patient states he went to the ED this past Monday because he had a "COPD attack". He states nothing out of the ordinary happened he just started coughing and he couldn't stop. After awhile it became very difficult for him to catch his breath and he couldn't take any deep breaths. He then called EMS where he was taken to the ED, started on oxygen and given duonebs and he got much better. A CXR showed no PNA and he was sent home on Prednisone and a controller inhaler that he can't recall the name of.   He has his home albuterol and Anoro Ellipta inhaler medications and knows to take Anoro Ellipta once a day every day and to take Albuterol as needed. He is finishing his prednisone tomorrow. He no longer has coughing, SOB, difficulty breathing, or chest tightness.   ROS: Denies Fever, chills, chest pain, abdominal pain, nausea, vomiting. Pertinent positives are listed in the HPI.  Pertinent PMHx: COPD, Seasonal Allergies, HLD, GERD  Exam:  Respiratory: speaking comfortably in full sentences * rest of the exam unable to be performed due to appointment occurring over the telephone.  Assessment/Plan:  COPD with chronic bronchitis ([place]) Patient had exacerbation but did not require hospitalization. Unclear as to what caused this attack but since he is responding to prednisone I would not make any changes to his regimen at this time. Could consider getting CBC w/Diff and if Eosinophil count is low transition from [facility] to [facility] for control. This would also lower his risk for developing a PNA. - Cont prednisone - Cont Albuterol prn - Cont Anoro Ellipta    Time spent on phone with patient: > 15 minutes  Harolyn Rutherford, DO [facility], PGY-2

## 2019-02-26 ENCOUNTER — Other Ambulatory Visit: Payer: Self-pay | Admitting: Family Medicine

## 2019-02-27 ENCOUNTER — Other Ambulatory Visit: Payer: Self-pay | Admitting: Family Medicine

## 2019-03-07 ENCOUNTER — Other Ambulatory Visit: Payer: Self-pay | Admitting: Family Medicine

## 2019-03-07 DIAGNOSIS — J42 Unspecified chronic bronchitis: Secondary | ICD-10-CM

## 2019-05-15 ENCOUNTER — Encounter: Payer: Self-pay | Admitting: Family Medicine

## 2019-05-28 ENCOUNTER — Other Ambulatory Visit: Payer: Self-pay

## 2019-05-29 MED ORDER — METOPROLOL TARTRATE 25 MG PO TABS: 25.0000 mg | ORAL_TABLET | Freq: Two times a day (BID) | ORAL | 0 refills | Status: DC

## 2019-07-26 ENCOUNTER — Ambulatory Visit: Payer: Medicare Other

## 2019-07-29 ENCOUNTER — Other Ambulatory Visit: Payer: Self-pay

## 2019-07-29 DIAGNOSIS — Z20822 Contact with and (suspected) exposure to covid-19: Secondary | ICD-10-CM

## 2019-07-30 LAB — NOVEL CORONAVIRUS, NAA: SARS-CoV-2, NAA: DETECTED — AB

## 2019-07-31 ENCOUNTER — Telehealth: Payer: Self-pay | Admitting: Family Medicine

## 2019-07-31 NOTE — Telephone Encounter (Signed)
Called and spoke with patient regarding his COVID 19 test results.  Lucas [name] reported that he felt fine and his wife is doing well too.  Patient understands that he is to quartine for 14 days.  Discussed symptoms and signs to seek emergency care, in the setting of his COPD.

## 2019-08-28 ENCOUNTER — Other Ambulatory Visit: Payer: Self-pay | Admitting: *Deleted

## 2019-08-28 MED ORDER — UMECLIDINIUM-VILANTEROL 62.5-25 MCG/INH IN AEPB: 1.0000 | INHALATION_SPRAY | Freq: Every day | RESPIRATORY_TRACT | 3 refills | Status: DC

## 2019-10-15 ENCOUNTER — Other Ambulatory Visit: Payer: Self-pay | Admitting: Family Medicine

## 2019-10-15 DIAGNOSIS — I208 Other forms of angina pectoris: Secondary | ICD-10-CM

## 2019-10-21 MED ORDER — NITROGLYCERIN 0.4 MG SL SUBL: 0.4000 mg | SUBLINGUAL_TABLET | SUBLINGUAL | 3 refills | Status: DC | PRN

## 2019-10-21 NOTE — Telephone Encounter (Signed)
2nd request for refills.  Shavanna Furnari,CMA

## 2019-11-20 ENCOUNTER — Other Ambulatory Visit: Payer: Self-pay | Admitting: *Deleted

## 2019-11-20 DIAGNOSIS — J42 Unspecified chronic bronchitis: Secondary | ICD-10-CM

## 2019-11-21 ENCOUNTER — Ambulatory Visit: Payer: Medicare Other | Admitting: Family Medicine

## 2019-11-22 ENCOUNTER — Other Ambulatory Visit: Payer: Self-pay | Admitting: Family Medicine

## 2019-11-22 MED ORDER — MONTELUKAST SODIUM 10 MG PO TABS: ORAL_TABLET | ORAL | 1 refills | Status: DC

## 2019-11-27 ENCOUNTER — Other Ambulatory Visit: Payer: Self-pay | Admitting: *Deleted

## 2019-11-27 DIAGNOSIS — J42 Unspecified chronic bronchitis: Secondary | ICD-10-CM

## 2019-11-27 MED ORDER — MONTELUKAST SODIUM 10 MG PO TABS: ORAL_TABLET | ORAL | 1 refills | Status: DC

## 2019-12-04 ENCOUNTER — Ambulatory Visit: Payer: Medicare Other | Admitting: Family Medicine

## 2019-12-12 ENCOUNTER — Inpatient Hospital Stay (HOSPITAL_COMMUNITY): Payer: Medicare Other

## 2019-12-12 ENCOUNTER — Emergency Department (HOSPITAL_COMMUNITY): Payer: Medicare Other

## 2019-12-12 ENCOUNTER — Encounter (HOSPITAL_COMMUNITY)
Admission: EM | Disposition: A | Discharge status: Home Health | Payer: Self-pay | Source: Home / Self Care | Attending: Thoracic Surgery (Cardiothoracic Vascular Surgery)

## 2019-12-12 ENCOUNTER — Other Ambulatory Visit: Payer: Self-pay | Admitting: *Deleted

## 2019-12-12 ENCOUNTER — Inpatient Hospital Stay (HOSPITAL_COMMUNITY)
Admission: EM | Admit: 2019-12-12 | Discharge: 2019-12-23 | DRG: 234 | Disposition: A | Discharge status: Home Health | Payer: Medicare Other | Attending: Thoracic Surgery (Cardiothoracic Vascular Surgery) | Admitting: Thoracic Surgery (Cardiothoracic Vascular Surgery)

## 2019-12-12 ENCOUNTER — Ambulatory Visit: Payer: Medicare Other

## 2019-12-12 ENCOUNTER — Other Ambulatory Visit: Payer: Self-pay

## 2019-12-12 ENCOUNTER — Encounter (HOSPITAL_COMMUNITY): Payer: Self-pay

## 2019-12-12 DIAGNOSIS — Z01810 Encounter for preprocedural cardiovascular examination: Secondary | ICD-10-CM | POA: Diagnosis not present

## 2019-12-12 DIAGNOSIS — I082 Rheumatic disorders of both aortic and tricuspid valves: Secondary | ICD-10-CM | POA: Diagnosis not present

## 2019-12-12 DIAGNOSIS — D62 Acute posthemorrhagic anemia: Secondary | ICD-10-CM | POA: Diagnosis not present

## 2019-12-12 DIAGNOSIS — Z79899 Other long term (current) drug therapy: Secondary | ICD-10-CM | POA: Diagnosis not present

## 2019-12-12 DIAGNOSIS — I10 Essential (primary) hypertension: Secondary | ICD-10-CM | POA: Diagnosis present

## 2019-12-12 DIAGNOSIS — R0602 Shortness of breath: Secondary | ICD-10-CM | POA: Diagnosis not present

## 2019-12-12 DIAGNOSIS — E785 Hyperlipidemia, unspecified: Secondary | ICD-10-CM | POA: Diagnosis not present

## 2019-12-12 DIAGNOSIS — I2510 Atherosclerotic heart disease of native coronary artery without angina pectoris: Secondary | ICD-10-CM

## 2019-12-12 DIAGNOSIS — E8770 Fluid overload, unspecified: Secondary | ICD-10-CM | POA: Diagnosis not present

## 2019-12-12 DIAGNOSIS — I25110 Atherosclerotic heart disease of native coronary artery with unstable angina pectoris: Secondary | ICD-10-CM | POA: Diagnosis not present

## 2019-12-12 DIAGNOSIS — Z951 Presence of aortocoronary bypass graft: Secondary | ICD-10-CM

## 2019-12-12 DIAGNOSIS — Z7952 Long term (current) use of systemic steroids: Secondary | ICD-10-CM

## 2019-12-12 DIAGNOSIS — R918 Other nonspecific abnormal finding of lung field: Secondary | ICD-10-CM

## 2019-12-12 DIAGNOSIS — I200 Unstable angina: Secondary | ICD-10-CM | POA: Diagnosis not present

## 2019-12-12 DIAGNOSIS — R069 Unspecified abnormalities of breathing: Secondary | ICD-10-CM | POA: Diagnosis not present

## 2019-12-12 DIAGNOSIS — J449 Chronic obstructive pulmonary disease, unspecified: Secondary | ICD-10-CM

## 2019-12-12 DIAGNOSIS — R7303 Prediabetes: Secondary | ICD-10-CM | POA: Diagnosis not present

## 2019-12-12 DIAGNOSIS — R222 Localized swelling, mass and lump, trunk: Secondary | ICD-10-CM | POA: Diagnosis not present

## 2019-12-12 DIAGNOSIS — F1010 Alcohol abuse, uncomplicated: Secondary | ICD-10-CM

## 2019-12-12 DIAGNOSIS — F17210 Nicotine dependence, cigarettes, uncomplicated: Secondary | ICD-10-CM | POA: Diagnosis present

## 2019-12-12 DIAGNOSIS — Z8616 Personal history of COVID-19: Secondary | ICD-10-CM | POA: Diagnosis not present

## 2019-12-12 DIAGNOSIS — I700 Atherosclerosis of aorta: Secondary | ICD-10-CM | POA: Diagnosis not present

## 2019-12-12 DIAGNOSIS — K7460 Unspecified cirrhosis of liver: Secondary | ICD-10-CM | POA: Diagnosis present

## 2019-12-12 DIAGNOSIS — R911 Solitary pulmonary nodule: Secondary | ICD-10-CM

## 2019-12-12 DIAGNOSIS — D72819 Decreased white blood cell count, unspecified: Secondary | ICD-10-CM | POA: Diagnosis present

## 2019-12-12 DIAGNOSIS — K5900 Constipation, unspecified: Secondary | ICD-10-CM | POA: Diagnosis not present

## 2019-12-12 DIAGNOSIS — J90 Pleural effusion, not elsewhere classified: Secondary | ICD-10-CM | POA: Diagnosis not present

## 2019-12-12 DIAGNOSIS — R0902 Hypoxemia: Secondary | ICD-10-CM | POA: Diagnosis not present

## 2019-12-12 DIAGNOSIS — R079 Chest pain, unspecified: Secondary | ICD-10-CM | POA: Diagnosis not present

## 2019-12-12 DIAGNOSIS — J939 Pneumothorax, unspecified: Secondary | ICD-10-CM | POA: Diagnosis not present

## 2019-12-12 DIAGNOSIS — M5489 Other dorsalgia: Secondary | ICD-10-CM | POA: Diagnosis not present

## 2019-12-12 DIAGNOSIS — C3411 Malignant neoplasm of upper lobe, right bronchus or lung: Secondary | ICD-10-CM | POA: Diagnosis present

## 2019-12-12 DIAGNOSIS — I214 Non-ST elevation (NSTEMI) myocardial infarction: Principal | ICD-10-CM

## 2019-12-12 DIAGNOSIS — Z720 Tobacco use: Secondary | ICD-10-CM | POA: Diagnosis not present

## 2019-12-12 DIAGNOSIS — Z4682 Encounter for fitting and adjustment of non-vascular catheter: Secondary | ICD-10-CM | POA: Diagnosis not present

## 2019-12-12 DIAGNOSIS — R0789 Other chest pain: Secondary | ICD-10-CM | POA: Diagnosis not present

## 2019-12-12 DIAGNOSIS — I340 Nonrheumatic mitral (valve) insufficiency: Secondary | ICD-10-CM | POA: Diagnosis not present

## 2019-12-12 DIAGNOSIS — Z20822 Contact with and (suspected) exposure to covid-19: Secondary | ICD-10-CM | POA: Diagnosis not present

## 2019-12-12 DIAGNOSIS — I472 Ventricular tachycardia: Secondary | ICD-10-CM | POA: Diagnosis present

## 2019-12-12 DIAGNOSIS — D696 Thrombocytopenia, unspecified: Secondary | ICD-10-CM | POA: Diagnosis not present

## 2019-12-12 DIAGNOSIS — Z743 Need for continuous supervision: Secondary | ICD-10-CM | POA: Diagnosis not present

## 2019-12-12 HISTORY — PX: LEFT HEART CATH AND CORONARY ANGIOGRAPHY: CATH118249

## 2019-12-12 LAB — I-STAT CHEM 8, ED
BUN: 10 mg/dL (ref 8–23)
Calcium, Ion: 1.11 mmol/L — ABNORMAL LOW (ref 1.15–1.40)
Chloride: 108 mmol/L (ref 98–111)
Creatinine, Ser: 1.2 mg/dL (ref 0.61–1.24)
Glucose, Bld: 90 mg/dL (ref 70–99)
HCT: 43 % (ref 39.0–52.0)
Hemoglobin: 14.6 g/dL (ref 13.0–17.0)
Potassium: 5.1 mmol/L (ref 3.5–5.1)
Sodium: 142 mmol/L (ref 135–145)
TCO2: 27 mmol/L (ref 22–32)

## 2019-12-12 LAB — CBC
HCT: 43.2 % (ref 39.0–52.0)
Hemoglobin: 14.1 g/dL (ref 13.0–17.0)
MCH: 31.9 pg (ref 26.0–34.0)
MCHC: 32.6 g/dL (ref 30.0–36.0)
MCV: 97.7 fL (ref 80.0–100.0)
Platelets: 163 10*3/uL (ref 150–400)
RBC: 4.42 MIL/uL (ref 4.22–5.81)
RDW: 13.9 % (ref 11.5–15.5)
WBC: 3.1 10*3/uL — ABNORMAL LOW (ref 4.0–10.5)
nRBC: 0 % (ref 0.0–0.2)

## 2019-12-12 LAB — COMPREHENSIVE METABOLIC PANEL
ALT: 40 U/L (ref 0–44)
AST: 53 U/L — ABNORMAL HIGH (ref 15–41)
Albumin: 4 g/dL (ref 3.5–5.0)
Alkaline Phosphatase: 63 U/L (ref 38–126)
Anion gap: 15 (ref 5–15)
BUN: 9 mg/dL (ref 8–23)
CO2: 21 mmol/L — ABNORMAL LOW (ref 22–32)
Calcium: 9.4 mg/dL (ref 8.9–10.3)
Chloride: 106 mmol/L (ref 98–111)
Creatinine, Ser: 1 mg/dL (ref 0.61–1.24)
GFR calc Af Amer: >60 mL/min (ref >60)
GFR calc non Af Amer: >60 mL/min (ref >60)
Glucose, Bld: 97 mg/dL (ref 70–99)
Potassium: 4.4 mmol/L (ref 3.5–5.1)
Sodium: 142 mmol/L (ref 135–145)
Total Bilirubin: 0.7 mg/dL (ref 0.3–1.2)
Total Protein: 7.5 g/dL (ref 6.5–8.1)

## 2019-12-12 LAB — MAGNESIUM: Magnesium: 2.2 mg/dL (ref 1.7–2.4)

## 2019-12-12 LAB — RESPIRATORY PANEL BY RT PCR (FLU A&B, COVID)
Influenza A by PCR: NEGATIVE
Influenza B by PCR: NEGATIVE
SARS Coronavirus 2 by RT PCR: NEGATIVE

## 2019-12-12 LAB — TROPONIN I (HIGH SENSITIVITY)
Troponin I (High Sensitivity): 32 ng/L — ABNORMAL HIGH (ref <18)
Troponin I (High Sensitivity): 55 ng/L — ABNORMAL HIGH (ref <18)

## 2019-12-12 LAB — PHOSPHORUS: Phosphorus: 4.8 mg/dL — ABNORMAL HIGH (ref 2.5–4.6)

## 2019-12-12 SURGERY — LEFT HEART CATH AND CORONARY ANGIOGRAPHY
Anesthesia: LOCAL

## 2019-12-12 MED ORDER — SODIUM CHLORIDE 0.9% FLUSH
3.0000 mL | Freq: Two times a day (BID) | INTRAVENOUS | Status: DC
  Administered 2019-12-12 – 2019-12-15 (×6): 3 mL via INTRAVENOUS

## 2019-12-12 MED ORDER — FENTANYL CITRATE (PF) 100 MCG/2ML IJ SOLN
INTRAMUSCULAR | Status: DC | PRN
  Administered 2019-12-12: 25 ug via INTRAVENOUS

## 2019-12-12 MED ORDER — VERAPAMIL HCL 2.5 MG/ML IV SOLN
INTRAVENOUS | Status: DC | PRN
  Administered 2019-12-12: 10 mL via INTRA_ARTERIAL

## 2019-12-12 MED ORDER — THIAMINE HCL 100 MG PO TABS
100.0000 mg | ORAL_TABLET | Freq: Every day | ORAL | Status: DC
  Administered 2019-12-12 – 2019-12-23 (×11): 100 mg via ORAL
  Filled 2019-12-12 (×11): qty 1

## 2019-12-12 MED ORDER — ZOLPIDEM TARTRATE 5 MG PO TABS: 5.0000 mg | ORAL_TABLET | Freq: Every evening | ORAL | Status: DC | PRN

## 2019-12-12 MED ORDER — ASPIRIN 81 MG PO CHEW
CHEWABLE_TABLET | ORAL | Status: AC
  Filled 2019-12-12: qty 1

## 2019-12-12 MED ORDER — FENTANYL CITRATE (PF) 100 MCG/2ML IJ SOLN
INTRAMUSCULAR | Status: AC
  Filled 2019-12-12: qty 2

## 2019-12-12 MED ORDER — HEPARIN (PORCINE) 25000 UT/250ML-% IV SOLN
800.0000 [IU]/h | INTRAVENOUS | Status: DC
  Administered 2019-12-12: 800 [IU]/h via INTRAVENOUS
  Filled 2019-12-12: qty 250

## 2019-12-12 MED ORDER — MONTELUKAST SODIUM 10 MG PO TABS
10.0000 mg | ORAL_TABLET | Freq: Every day | ORAL | Status: DC
  Administered 2019-12-12 – 2019-12-22 (×11): 10 mg via ORAL
  Filled 2019-12-12 (×12): qty 1

## 2019-12-12 MED ORDER — HEPARIN (PORCINE) IN NACL 1000-0.9 UT/500ML-% IV SOLN
INTRAVENOUS | Status: AC
  Filled 2019-12-12: qty 1000

## 2019-12-12 MED ORDER — HEPARIN SODIUM (PORCINE) 1000 UNIT/ML IJ SOLN
INTRAMUSCULAR | Status: AC
  Filled 2019-12-12: qty 1

## 2019-12-12 MED ORDER — VERAPAMIL HCL 2.5 MG/ML IV SOLN
INTRAVENOUS | Status: AC
  Filled 2019-12-12: qty 2

## 2019-12-12 MED ORDER — ROSUVASTATIN CALCIUM 20 MG PO TABS
20.0000 mg | ORAL_TABLET | Freq: Every day | ORAL | Status: DC
  Administered 2019-12-12 – 2019-12-23 (×11): 20 mg via ORAL
  Filled 2019-12-12 (×11): qty 1

## 2019-12-12 MED ORDER — FLUTICASONE PROPIONATE 50 MCG/ACT NA SUSP
2.0000 | Freq: Every day | NASAL | Status: DC
  Administered 2019-12-13 – 2019-12-22 (×6): 2 via NASAL
  Filled 2019-12-12: qty 16

## 2019-12-12 MED ORDER — NITROGLYCERIN 0.4 MG SL SUBL: 0.4000 mg | SUBLINGUAL_TABLET | SUBLINGUAL | Status: DC | PRN

## 2019-12-12 MED ORDER — ONDANSETRON HCL 4 MG/2ML IJ SOLN: 4.0000 mg | Freq: Four times a day (QID) | INTRAMUSCULAR | Status: DC | PRN

## 2019-12-12 MED ORDER — HEPARIN (PORCINE) 25000 UT/250ML-% IV SOLN
700.0000 [IU]/h | INTRAVENOUS | Status: DC
  Administered 2019-12-12: 800 [IU]/h via INTRAVENOUS
  Administered 2019-12-13: 900 [IU]/h via INTRAVENOUS
  Administered 2019-12-15: 700 [IU]/h via INTRAVENOUS
  Filled 2019-12-12 (×3): qty 250

## 2019-12-12 MED ORDER — METOPROLOL TARTRATE 25 MG PO TABS
25.0000 mg | ORAL_TABLET | Freq: Two times a day (BID) | ORAL | Status: DC
  Administered 2019-12-12 – 2019-12-15 (×7): 25 mg via ORAL
  Filled 2019-12-12 (×7): qty 1

## 2019-12-12 MED ORDER — ACETAMINOPHEN 325 MG PO TABS: 650.0000 mg | ORAL_TABLET | ORAL | Status: DC | PRN

## 2019-12-12 MED ORDER — ADULT MULTIVITAMIN W/MINERALS CH
1.0000 | ORAL_TABLET | Freq: Every day | ORAL | Status: DC
  Administered 2019-12-12 – 2019-12-22 (×10): 1 via ORAL
  Filled 2019-12-12 (×11): qty 1

## 2019-12-12 MED ORDER — IOHEXOL 350 MG/ML SOLN
INTRAVENOUS | Status: DC | PRN
  Administered 2019-12-12: 75 mL

## 2019-12-12 MED ORDER — THIAMINE HCL 100 MG/ML IJ SOLN
100.0000 mg | Freq: Every day | INTRAMUSCULAR | Status: DC
  Filled 2019-12-12 (×2): qty 2

## 2019-12-12 MED ORDER — SODIUM CHLORIDE 0.9 % IV SOLN: INTRAVENOUS | Status: AC

## 2019-12-12 MED ORDER — PANTOPRAZOLE SODIUM 40 MG PO TBEC
40.0000 mg | DELAYED_RELEASE_TABLET | Freq: Two times a day (BID) | ORAL | Status: DC
  Administered 2019-12-12 – 2019-12-15 (×7): 40 mg via ORAL
  Filled 2019-12-12 (×7): qty 1

## 2019-12-12 MED ORDER — ALBUTEROL SULFATE HFA 108 (90 BASE) MCG/ACT IN AERS: 2.0000 | INHALATION_SPRAY | Freq: Four times a day (QID) | RESPIRATORY_TRACT | Status: DC | PRN

## 2019-12-12 MED ORDER — ALBUTEROL SULFATE (2.5 MG/3ML) 0.083% IN NEBU
2.5000 mg | INHALATION_SOLUTION | Freq: Four times a day (QID) | RESPIRATORY_TRACT | Status: DC | PRN
  Administered 2019-12-15 (×2): 2.5 mg via RESPIRATORY_TRACT
  Filled 2019-12-12 (×2): qty 3

## 2019-12-12 MED ORDER — FOLIC ACID 1 MG PO TABS
1.0000 mg | ORAL_TABLET | Freq: Every day | ORAL | Status: DC
  Administered 2019-12-12 – 2019-12-21 (×9): 1 mg via ORAL
  Filled 2019-12-12 (×9): qty 1

## 2019-12-12 MED ORDER — MORPHINE SULFATE (PF) 4 MG/ML IV SOLN: 4.0000 mg | Freq: Once | INTRAVENOUS | Status: DC

## 2019-12-12 MED ORDER — VITAMIN D 25 MCG (1000 UNIT) PO TABS
1000.0000 [IU] | ORAL_TABLET | Freq: Every day | ORAL | Status: DC
  Administered 2019-12-12 – 2019-12-22 (×10): 1000 [IU] via ORAL
  Filled 2019-12-12 (×11): qty 1

## 2019-12-12 MED ORDER — LIDOCAINE HCL (PF) 1 % IJ SOLN
INTRAMUSCULAR | Status: DC | PRN
  Administered 2019-12-12: 2 mL

## 2019-12-12 MED ORDER — LORAZEPAM 2 MG/ML IJ SOLN: 1.0000 mg | INTRAMUSCULAR | Status: AC | PRN

## 2019-12-12 MED ORDER — SODIUM CHLORIDE 0.9 % IV SOLN: 250.0000 mL | INTRAVENOUS | Status: DC | PRN

## 2019-12-12 MED ORDER — ALPRAZOLAM 0.25 MG PO TABS
0.2500 mg | ORAL_TABLET | Freq: Two times a day (BID) | ORAL | Status: DC | PRN
  Administered 2019-12-15: 0.25 mg via ORAL
  Filled 2019-12-12: qty 1

## 2019-12-12 MED ORDER — SODIUM CHLORIDE 0.9 % WEIGHT BASED INFUSION: 3.0000 mL/kg/h | INTRAVENOUS | Status: DC

## 2019-12-12 MED ORDER — MIDAZOLAM HCL 2 MG/2ML IJ SOLN
INTRAMUSCULAR | Status: DC | PRN
  Administered 2019-12-12: 1 mg via INTRAVENOUS

## 2019-12-12 MED ORDER — MIDAZOLAM HCL 2 MG/2ML IJ SOLN
INTRAMUSCULAR | Status: AC
  Filled 2019-12-12: qty 2

## 2019-12-12 MED ORDER — HEPARIN (PORCINE) IN NACL 1000-0.9 UT/500ML-% IV SOLN
INTRAVENOUS | Status: DC | PRN
  Administered 2019-12-12 (×2): 500 mL

## 2019-12-12 MED ORDER — UMECLIDINIUM-VILANTEROL 62.5-25 MCG/INH IN AEPB
1.0000 | INHALATION_SPRAY | Freq: Every day | RESPIRATORY_TRACT | Status: DC
  Administered 2019-12-13 – 2019-12-23 (×8): 1 via RESPIRATORY_TRACT
  Filled 2019-12-12 (×2): qty 14

## 2019-12-12 MED ORDER — ASPIRIN 81 MG PO CHEW
81.0000 mg | CHEWABLE_TABLET | Freq: Every day | ORAL | Status: DC
  Administered 2019-12-12 – 2019-12-15 (×4): 81 mg via ORAL
  Filled 2019-12-12 (×3): qty 1

## 2019-12-12 MED ORDER — IOHEXOL 350 MG/ML SOLN
100.0000 mL | Freq: Once | INTRAVENOUS | Status: AC | PRN
  Administered 2019-12-12: 100 mL via INTRAVENOUS

## 2019-12-12 MED ORDER — LORAZEPAM 1 MG PO TABS: 1.0000 mg | ORAL_TABLET | ORAL | Status: AC | PRN

## 2019-12-12 MED ORDER — SODIUM CHLORIDE 0.9% FLUSH: 3.0000 mL | INTRAVENOUS | Status: DC | PRN

## 2019-12-12 MED ORDER — LORATADINE 10 MG PO TABS
10.0000 mg | ORAL_TABLET | Freq: Every day | ORAL | Status: DC
  Administered 2019-12-13 – 2019-12-23 (×10): 10 mg via ORAL
  Filled 2019-12-12 (×11): qty 1

## 2019-12-12 MED ORDER — SODIUM CHLORIDE 0.9 % WEIGHT BASED INFUSION: 1.0000 mL/kg/h | INTRAVENOUS | Status: DC

## 2019-12-12 MED ORDER — LIDOCAINE HCL (PF) 1 % IJ SOLN
INTRAMUSCULAR | Status: AC
  Filled 2019-12-12: qty 30

## 2019-12-12 MED ORDER — HEPARIN SODIUM (PORCINE) 1000 UNIT/ML IJ SOLN
INTRAMUSCULAR | Status: DC | PRN
  Administered 2019-12-12: 3500 [IU] via INTRAVENOUS

## 2019-12-12 MED ORDER — MORPHINE SULFATE (PF) 4 MG/ML IV SOLN
4.0000 mg | Freq: Once | INTRAVENOUS | Status: AC
  Administered 2019-12-12: 4 mg via INTRAVENOUS

## 2019-12-12 MED ORDER — NITROGLYCERIN IN D5W 200-5 MCG/ML-% IV SOLN
0.0000 ug/min | INTRAVENOUS | Status: DC
  Administered 2019-12-12 – 2019-12-16 (×2): 5 ug/min via INTRAVENOUS
  Filled 2019-12-12: qty 250

## 2019-12-12 MED ORDER — HEPARIN BOLUS VIA INFUSION
4000.0000 [IU] | Freq: Once | INTRAVENOUS | Status: AC
  Administered 2019-12-12: 4000 [IU] via INTRAVENOUS
  Filled 2019-12-12: qty 4000

## 2019-12-12 SURGICAL SUPPLY — 9 items
CATH OPTITORQUE TIG 4.0 5F (CATHETERS) ×1 IMPLANT
DEVICE RAD COMP TR BAND LRG (VASCULAR PRODUCTS) ×1 IMPLANT
GLIDESHEATH SLEND SS 6F .021 (SHEATH) ×1 IMPLANT
GUIDEWIRE INQWIRE 1.5J.035X260 (WIRE) ×1 IMPLANT
INQWIRE 1.5J .035X260CM (WIRE) ×2
KIT HEART LEFT (KITS) ×2 IMPLANT
PACK CARDIAC CATHETERIZATION (CUSTOM PROCEDURE TRAY) ×2 IMPLANT
TRANSDUCER W/STOPCOCK (MISCELLANEOUS) ×2 IMPLANT
TUBING CIL FLEX 10 FLL-RA (TUBING) ×2 IMPLANT

## 2019-12-12 NOTE — ED Notes (Signed)
Pt taken to and from CT without incidence

## 2019-12-12 NOTE — ED Provider Notes (Signed)
[facility] EMERGENCY DEPARTMENT Provider Note   CSN: [id number] Arrival date & time: 12/12/19  0435     History Chief Complaint  Patient presents with  . Chest Pain  . Shortness of Breath    Lucas [name] is a 69 y.o. male with an extensive past medical history including acute blood loss anemia, upper GI bleed, COPD, hyperlipidemia, hypertension, tobacco dependence, bilateral AVN of the femurs, gastritis and gastroduodenitis, cirrhosis of the liver.  Patient underwent a left heart cath on 06/27/2018 that showed three-vessel CAD with heavily calcified coronary arteries, 65% ostial lesion to the proximal LAD, 100% second diagonal occlusion, 70% ostial to the proximal left circumflex and 85% to the mid RCA.  Patient is treated medically.  He has a history of chronic stable angina.  Patient states that he has had intermittent chest pain which he describes as burning and pressure-like.  Around 11 PM when he went to bed it became worse and has progressively worse and is now radiating between his shoulder blades.  Nothing seems to make it worse.  It did improve after 3 sublingual nitroglycerin.  He denies hemoptysis, shortness of breath, leg swelling.  HPI  HPI: A 69 year old patient with a history of hypertension presents for evaluation of chest pain. Initial onset of pain was less than one hour ago. The patient's chest pain is described as heaviness/pressure/tightness, is not worse with exertion and is relieved by nitroglycerin. The patient's chest pain is middle- or left-sided, is not well-localized, is not sharp and does not radiate to the arms/jaw/neck. The patient does not complain of nausea and denies diaphoresis. The patient has smoked in the past 90 days. The patient has no history of stroke, has no history of peripheral artery disease, denies any history of treated diabetes, has no relevant family history of coronary artery disease (first degree relative at less than age 54),  has no history of hypercholesterolemia and does not have an elevated BMI (>=30).   Past Medical History:  Diagnosis Date  . Acute blood loss anemia 06/21/2018  . Acute upper GI bleed 06/21/2018  . Allergy   . Bilateral AVN of femurs ([place]) 06/25/2018   See 11/2017 Chest CT report  . COPD 12/21/2006  . Environmental allergies   . History of femur fracture 06/25/2018   11/2017 CT AP showed Remote nonunited right femur greater trochanter fracture  . History of Fracture of right tibia 06/26/2018  . History of multiple right thoacic rib fractures 06/25/2018  . History of Right clavicular fracture 06/25/2018  . HYPERLIPIDEMIA 12/21/2006  . HYPERTENSION, BENIGN SYSTEMIC 12/21/2006  . Mallory-Weiss tear   . TOBACCO DEPENDENCE 12/21/2006    Patient Active Problem List   Diagnosis Date Noted  . CAD (coronary artery disease) 07/11/2018  . H. pylori infection 07/03/2018  . Chronic stable angina ([place]) 07/03/2018  . Duodenal ulcer due to Helicobacter pylori   . History of Right clavicular fracture 06/25/2018  . Bilateral AVN of femurs ([place]) 06/25/2018  . Chronic cough 06/25/2018  . Mallory-Weiss tear   . Gastritis and gastroduodenitis   . Cirrhosis of liver ([place])   . Tubular adenoma of colon 01/24/2017  . Ganglion cyst 01/03/2012  . PERIPHERAL NEUROPATHY 07/15/2010  . Alcohol use disorder, severe, dependence ([place]) 05/14/2009  . DEFORMITY, FOOT NEC, CONGENITAL 08/22/2007  . ALLERGIC RHINITIS, SEASONAL 05/03/2007  . Hyperlipidemia 12/21/2006  . Tobacco abuse 12/21/2006  . COPD with chronic bronchitis ([place]) 12/21/2006    Past Surgical History:  Procedure  Laterality Date  . ANKLE SURGERY     left ankle  . APPLICATION OF WOUND VAC  01/06/2012   Procedure: APPLICATION OF WOUND VAC;  Surgeon: Meredith Pel, MD;  Location: [facility];  Service: Orthopedics;  Laterality: Left;  . APPLICATION OF WOUND VAC Right 10/17/2015   Procedure: APPLICATION OF WOUND VAC;  Surgeon: Leandrew Koyanagi, MD;  Location: [facility];   Service: Orthopedics;  Laterality: Right;  . BIOPSY  06/22/2018   Procedure: BIOPSY;  Surgeon: Dorothea Ogle v, DO;  Location: [facility];  Service: Gastroenterology;;  . ESOPHAGOGASTRODUODENOSCOPY (EGD) WITH PROPOFOL N/A 06/22/2018   Procedure: ESOPHAGOGASTRODUODENOSCOPY (EGD) WITH PROPOFOL;  Surgeon: [id number] v, DO;  Location: [facility];  Service: Gastroenterology;  Laterality: N/A;  . EXTERNAL FIXATION LEG  01/06/2012   Procedure: EXTERNAL FIXATION LEG;  Surgeon: Meredith Pel, MD;  Location: [facility];  Service: Orthopedics;  Laterality: Left;  Smith-nephew external fixator set  . EXTERNAL FIXATION LEG Right 09/16/2015   Procedure: EXTERNAL FIXATION LEG/LARGE;  Surgeon: Mcarthur Rossetti, MD;  Location: [facility];  Service: Orthopedics;  Laterality: Right;  . EXTERNAL FIXATION REMOVAL Right 09/22/2015   Procedure: REMOVAL EXTERNAL FIXATION LEG;  Surgeon: Mcarthur Rossetti, MD;  Location: [facility];  Service: Orthopedics;  Laterality: Right;  . FASCIOTOMY  01/10/2012   Procedure: FASCIOTOMY;  Surgeon: Rozanna Box, MD;  Location: [facility];  Service: Orthopedics;  Laterality: Left;  status post fasciotomy with wound vac left calf; adjustment external fixator left tibia under fluoro; retention suture/wound vac placement left lateral calf wound  . FASCIOTOMY  01/06/2012   Procedure: FASCIOTOMY;  Surgeon: Meredith Pel, MD;  Location: [facility];  Service: Orthopedics;  Laterality: Left;  Four compartment fasciotomy  . HOT HEMOSTASIS N/A 06/22/2018   Procedure: HEMOSTASIS;  Surgeon: [id number] v, DO;  Location: [facility];  Service: Gastroenterology;  Laterality: N/A;  Epinepherine and clip placed  . I & D EXTREMITY  01/12/2012   Procedure: IRRIGATION AND DEBRIDEMENT EXTREMITY;  Surgeon: Rozanna Box, MD;  Location: [facility];  Service: Orthopedics;  Laterality: Left;  LAYERD CLOSURE LEFT LEG WOUND  . I & D EXTREMITY Right 10/17/2015   Procedure: IRRIGATION AND DEBRIDEMENT  EXTREMITY;  Surgeon: Leandrew Koyanagi, MD;  Location: [facility];  Service: Orthopedics;  Laterality: Right;  . I & D EXTREMITY Right 10/20/2015   Procedure: REPEAT IRRIGATION AND DEBRIDEMENT EXTREMITY, right lower leg;  Surgeon: Mcarthur Rossetti, MD;  Location: [facility];  Service: Orthopedics;  Laterality: Right;  . KNEE SURGERY     left knee and right knee  . LEFT HEART CATH AND CORONARY ANGIOGRAPHY N/A 06/27/2018   Procedure: LEFT HEART CATH AND CORONARY ANGIOGRAPHY;  Surgeon: Martinique, Peter M, MD;  Location: [facility];  Service: Cardiovascular;  Laterality: N/A;  . MASS EXCISION Left 09/22/2015   Procedure: EXCISION MASS;  Surgeon: Mcarthur Rossetti, MD;  Location: [facility];  Service: Orthopedics;  Laterality: Left;  . ORIF TIBIA PLATEAU  02/07/2012   Procedure: OPEN REDUCTION INTERNAL FIXATION (ORIF) TIBIAL PLATEAU;  Surgeon: Rozanna Box, MD;  Location: [facility];  Service: Orthopedics;  Laterality: Left;  ORIF LEFT TIBIAL PLATEAU/ REMOVE EXTERNAL FIXATION LEFT LEG  . ORIF TIBIA PLATEAU Right 09/22/2015   Procedure: REMOVAL OF EXTERNAL FIXATOR RIGHT LEG, OPEN REDUCTION INTERNAL FIXATION (ORIF) RIGHT TIBIAL PLATEAU, EXCISION SMALL MASS LEFT LEG;  Surgeon: Mcarthur Rossetti, MD;  Location: [facility];  Service: Orthopedics;  Laterality: Right;  . SKIN SPLIT GRAFT Right 10/23/2015  Procedure: RIGHT GASTROC FLAP SPLIT THICKNESS SKIN GRAFT FROM RIGHT THING TO RIGHT LEG;  Surgeon: Irene Limbo, MD;  Location: [facility];  Service: Plastics;  Laterality: Right;  . WRIST SURGERY     left       Family History  Problem Relation Age of Onset  . Emphysema Father     Social History   Tobacco Use  . Smoking status: Current Every Day Smoker    Packs/day: 0.50    Years: 51.00    Pack years: 25.50    Types: Cigarettes  . Smokeless tobacco: Former User    Types: Snuff, Sarina Ser    Quit date: 01/05/1965  Substance Use Topics  . Alcohol use: Yes    Alcohol/week: 3.0 - 4.0 standard drinks     Types: 3 - 4 Cans of beer per week    Comment: 3-4 cans beer daily  . Drug use: No    Home Medications Prior to Admission medications   Medication Sig Start Date End Date Taking? Authorizing Provider  albuterol (PROAIR HFA) 108 (90 Base) MCG/ACT inhaler Inhale 2 puffs into the lungs every 6 (six) hours as needed for wheezing or shortness of breath. 12/05/18   Steve Rattler, DO  cetirizine (ZYRTEC) 10 MG tablet TAKE 1 TABLET BY MOUTH EVERY DAY 11/22/19   [name], MD  Cholecalciferol (VITAMIN D3) 400 units tablet Take 2 tablets (800 Units total) by mouth daily. 07/03/18   Martyn Malay, MD  fluticasone [facility]) 50 MCG/ACT nasal spray SPRAY 2 SPRAYS INTO EACH NOSTRIL EVERY DAY 07/19/18   Lucila Maine C, DO  isosorbide mononitrate (IMDUR) 30 MG 24 hr tablet TAKE 1 TABLET BY MOUTH EVERY DAY 02/27/19   Lucila Maine C, DO  metoprolol tartrate (LOPRESSOR) 25 MG tablet TAKE 1 TABLET BY MOUTH TWICE A DAY 10/21/19   [name], MD  montelukast (SINGULAIR) 10 MG tablet TAKE 1 TABLET BY MOUTH EVERYDAY AT BEDTIME 11/27/19   [name], MD  nitroGLYCERIN (NITROSTAT) 0.4 MG SL tablet Place 1 tablet (0.4 mg total) under the tongue every 5 (five) minutes as needed for chest pain. 10/21/19   [name], MD  pantoprazole (PROTONIX) 40 MG tablet Take 1 tablet (40 mg total) by mouth 2 (two) times daily. 07/03/18 08/26/18  Martyn Malay, MD  predniSONE (DELTASONE) 20 MG tablet 2 tabs po daily x 4 days 01/21/19   Sherwood Gambler, MD  rosuvastatin (CRESTOR) 20 MG tablet Take 1 tablet (20 mg total) by mouth daily. 07/11/18   [facility], Tami Lin, PA  sucralfate (CARAFATE) 1 GM/10ML suspension Take 10 mLs (1 g total) by mouth 4 (four) times daily for 3 days. 07/03/18 07/06/18  Martyn Malay, MD  umeclidinium-vilanterol (ANORO ELLIPTA) 62.5-25 MCG/INH AEPB Inhale 1 puff into the lungs daily. 08/28/19   [name], MD  loratadine (CLARITIN) 10 MG tablet Take 1 tablet (10 mg total) by mouth daily.  03/04/11 05/23/18  Mariana Arn, MD  metoprolol tartrate (LOPRESSOR) 25 MG tablet Take 1 tablet (25 mg total) by mouth 2 (two) times daily. 05/29/19   [name], MD    Allergies    Ace inhibitors, Ampicillin, Penicillins, Codeine, and Lisinopril  Review of Systems   Review of Systems Ten systems reviewed and are negative for acute change, except as noted in the HPI.   Physical Exam Updated Vital Signs BP 121/70 (BP Location: Right Arm)   Pulse 69   Temp 97.7 F (36.5 C) (Oral)   Resp (!) 21  Ht 5\' 11"  (1.803 m)   Wt 68 kg   SpO2 98%   BMI 20.92 kg/m   Physical Exam Vitals and nursing note reviewed.  Constitutional:      General: He is not in acute distress.    Appearance: He is well-developed. He is ill-appearing. He is not diaphoretic.  HENT:     Head: Normocephalic and atraumatic.  Eyes:     General: No scleral icterus.    Conjunctiva/sclera: Conjunctivae normal.  Cardiovascular:     Rate and Rhythm: Normal rate and regular rhythm.     Heart sounds: Normal heart sounds.  Pulmonary:     Effort: Pulmonary effort is normal. No respiratory distress.     Breath sounds: Normal breath sounds.  Abdominal:     Palpations: Abdomen is soft.     Tenderness: There is no abdominal tenderness.  Musculoskeletal:     Cervical back: Normal range of motion and neck supple.  Skin:    General: Skin is warm and dry.  Neurological:     Mental Status: He is alert.  Psychiatric:        Behavior: Behavior normal.     ED Results / Procedures / Treatments   Labs (all labs ordered are listed, but only abnormal results are displayed) Labs Reviewed  CBC - Abnormal; Notable for the following components:      Result Value   WBC 3.1 (*)    All other components within normal limits  COMPREHENSIVE METABOLIC PANEL - Abnormal; Notable for the following components:   CO2 21 (*)    AST 53 (*)    All other components within normal limits  I-STAT CHEM 8, ED - Abnormal; Notable for the  following components:   Calcium, Ion 1.11 (*)    All other components within normal limits  TROPONIN I (HIGH SENSITIVITY) - Abnormal; Notable for the following components:   Troponin I (High Sensitivity) 32 (*)    All other components within normal limits  TROPONIN I (HIGH SENSITIVITY)    EKG None   ECG interpretation   Date: 12/12/2019  Rate: 60  Rhythm: normal sinus rhythm  QRS Axis: normal  Intervals: normal  ST/T Wave abnormalities: normal  Conduction Disutrbances: none  Narrative Interpretation:   Old EKG Reviewed: No significant changes noted    Radiology DG Chest Port 1 View  Result Date: 12/12/2019 CLINICAL DATA:  Chest pain and shortness of breath. EXAM: PORTABLE CHEST 1 VIEW COMPARISON:  Radiograph 12/25/2018. CT 12/08/2017 FINDINGS: Stable hyperinflation. Chronic scarring at the right lung base with multiple remote right rib fractures. Normal heart size with unchanged mediastinal contours. No acute airspace disease. No pulmonary edema or pneumothorax. IMPRESSION: 1. No acute chest findings. 2. Chronic scarring at the right lung base with chronic hyperinflation. Electronically Signed   By: Keith Rake M.D.   On: 12/12/2019 05:51    Procedures Procedures (including critical care time)  Medications Ordered in ED Medications  iohexol (OMNIPAQUE) 350 MG/ML injection 100 mL (has no administration in time range)    ED Course  I have reviewed the triage vital signs and the nursing notes.  Pertinent labs & imaging results that were available during my care of the patient were reviewed by me and considered in my medical decision making (see chart for details).    MDM Rules/Calculators/A&P HEAR Score: 40                   69 year old male here with chest pain radiating  to his back patient has a history of stable angina, known extensive CAD treated medically he has persistent chest pain which has improved with nitroglycerin.  Troponin is elevated.  White blood cell  count is mildly low.  Patient CMP shows a slightly low bicarb and elevated AST.  He does have a history of cirrhosis.  Repeat troponin is pending.  Personally viewed the patient's 1 view chest x-ray which shows hyperinflation, no other acute abnormalities on my interpretation.  EKG is unremarkable and unchanged from previous chasing tracings without evidence of ischemia.  CT angiogram of the chest abdomen pelvis to rule out dissection is currently pending I have given signout to [place].  Patient is otherwise stable, comfortable at this time and hemodynamically stable.  Final Clinical Impression(s) / ED Diagnoses Final diagnoses:  None    Rx / DC Orders ED Discharge Orders    None       Margarita Mail, PA-C 12/12/19 [id number]    Fatima Blank, MD 12/19/19 [phone]
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
## 2019-12-12 NOTE — Progress Notes (Signed)
[place] for heparin Indication: chest pain/ACS  Allergies  Allergen Reactions  . Ace Inhibitors Swelling  . Ampicillin Nausea And Vomiting  . Penicillins Nausea And Vomiting    Did it involve swelling of the face/tongue/throat, SOB, or low BP? N Did it involve sudden or severe rash/hives, skin peeling, or any reaction on the inside of your mouth or nose? N Did you need to seek medical attention at a hospital or doctor's office? N When did it last happen?Teenager If all above answers are "NO", may proceed with cephalosporin use.  . Codeine Nausea And Vomiting  . Lisinopril Swelling    REACTION: angioedema    Patient Measurements: Height: 5\' 11"  (180.3 cm) Weight: 150 lb (68 kg) IBW/kg (Calculated) : 75.3 Heparin Dosing Weight: 68 kg  Vital Signs: Temp: 97.7 F (36.5 C) (02/18 0439) Temp Source: Oral (02/18 0439) BP: 138/78 (02/18 1400) Pulse Rate: 67 (02/18 1400)  Labs: Recent Labs    12/12/19 0517 12/12/19 0534 12/12/19 0723  HGB 14.1 14.6  --   HCT 43.2 43.0  --   PLT 163  --   --   CREATININE 1.00 1.20  --   TROPONINIHS 32*  --  55*    Estimated Creatinine Clearance: 55.9 mL/min (by C-G formula based on SCr of 1.2 mg/dL).   Medical History: Past Medical History:  Diagnosis Date  . Acute blood loss anemia 06/21/2018  . Acute upper GI bleed 06/21/2018  . Allergy   . Bilateral AVN of femurs ([place]) 06/25/2018   See 11/2017 Chest CT report  . COPD 12/21/2006  . Environmental allergies   . History of femur fracture 06/25/2018   11/2017 CT AP showed Remote nonunited right femur greater trochanter fracture  . History of Fracture of right tibia 06/26/2018  . History of multiple right thoacic rib fractures 06/25/2018  . History of Right clavicular fracture 06/25/2018  . HYPERLIPIDEMIA 12/21/2006  . HYPERTENSION, BENIGN SYSTEMIC 12/21/2006  . Mallory-Weiss tear   . TOBACCO DEPENDENCE 12/21/2006    Medications:  Scheduled:  .  aspirin  81 mg Oral Daily  . [MAR Hold] cholecalciferol  1,000 Units Oral Daily  . [MAR Hold] fluticasone  2 spray Each Nare Daily  . [MAR Hold] loratadine  10 mg Oral Daily  . [MAR Hold] metoprolol tartrate  25 mg Oral BID  . [MAR Hold] montelukast  10 mg Oral QHS  . [MAR Hold]  morphine injection  4 mg Intravenous Once  . [MAR Hold] pantoprazole  40 mg Oral BID  . [MAR Hold] rosuvastatin  20 mg Oral Daily  . [MAR Hold] umeclidinium-vilanterol  1 puff Inhalation Daily    Assessment: 42 yom presenting with chest pain radiating to back - no AC PTA.  CT neg for dissection.   Hgb 14.6, plt 163. Trop 32. No s/sx of bleeding noted- notes indicate hx of upper GIB.   S/p cath this afternoon showing multivessel CAD. Orders to restart heparin tonight. Consult pending possible surgery.    Goal of Therapy:  Heparin level 0.3-0.7 units/ml Monitor platelets by anticoagulation protocol: Yes   Plan:  Restart heparin at 800 units/hr tonight Heparin level in am then daily w/CBC  Erin Hearing PharmD., BCPS Clinical Pharmacist 12/12/2019 3:05 PM

## 2019-12-12 NOTE — ED Notes (Signed)
EKG completed and given to PA

## 2019-12-12 NOTE — ED Provider Notes (Signed)
Medical screening examination/treatment/procedure(s) were conducted as a shared visit with non-physician practitioner(s) and myself.  I personally evaluated the patient during the encounter.  EKG Interpretation  Date/Time:  Thursday December 12 2019 05:05:22 EST Ventricular Rate:  66 PR Interval:    QRS Duration: 79 QT Interval:  401 QTC Calculation: 421 R Axis:   82 Text Interpretation: Age not entered, assumed to be  69 years old for purpose of ECG interpretation Sinus rhythm Consider left atrial enlargement Consider left ventricular hypertrophy improve rate No acute changes Confirmed by Addison Lank [phone]) on 12/12/2019 7:46:02 AM  68yM with CP. Known CAD and felt not to be great candidate for intervention previously. Initial troponin mildly elevated. Pain in chest and into back but CTa negative for dissection. Unfortunately incidentally likely bronchogenic Repeat EKG with perhaps mildly worsened depression. Ongoing symptoms. Will start heparin. Admit. 9     Lucas Manifold, MD 12/12/19 [phone]

## 2019-12-12 NOTE — ED Provider Notes (Signed)
At shift change, care assumed from [facility], [place]. See her note for full H&P.   Per her note, " Lucas [name] is a 69 y.o. male with an extensive past medical history including acute blood loss anemia, upper GI bleed, COPD, hyperlipidemia, hypertension, tobacco dependence, bilateral AVN of the femurs, gastritis and gastroduodenitis, cirrhosis of the liver.  Patient underwent a left heart cath on 06/27/2018 that showed three-vessel CAD with heavily calcified coronary arteries, 65% ostial lesion to the proximal LAD, 100% second diagonal occlusion, 70% ostial to the proximal left circumflex and 85% to the mid RCA.  Patient is treated medically.  He has a history of chronic stable angina.  Patient states that he has had intermittent chest pain which he describes as burning and pressure-like.  Around 11 PM when he went to bed it became worse and has progressively worse and is now radiating between his shoulder blades.  Nothing seems to make it worse.  It did improve after 3 sublingual nitroglycerin.  He denies hemoptysis, shortness of breath, leg swelling.  HPI  HPI: A 69 year old patient with a history of hypertension presents for evaluation of chest pain. Initial onset of pain was less than one hour ago. The patient's chest pain is described as heaviness/pressure/tightness, is not worse with exertion and is relieved by nitroglycerin. The patient's chest pain is middle- or left-sided, is not well-localized, is not sharp and does not radiate to the arms/jaw/neck. The patient does not complain of nausea and denies diaphoresis. The patient has smoked in the past 90 days. The patient has no history of stroke, has no history of peripheral artery disease, denies any history of treated diabetes, has no relevant family history of coronary artery disease (first degree relative at less than age 64), has no history of hypercholesterolemia and does not have an elevated BMI (>=30).  "  Physical Exam  BP 131/78   Pulse  69   Temp 97.7 F (36.5 C) (Oral)   Resp 18   Ht 5\' 11"  (1.803 m)   Wt 68 kg   SpO2 99%   BMI 20.92 kg/m   Physical Exam Constitutional:      General: He is not in acute distress.    Appearance: He is well-developed.  Eyes:     Conjunctiva/sclera: Conjunctivae normal.  Cardiovascular:     Rate and Rhythm: Normal rate and regular rhythm.  Pulmonary:     Effort: Pulmonary effort is normal.     Breath sounds: Normal breath sounds.  Skin:    General: Skin is warm and dry.  Neurological:     Mental Status: He is alert and oriented to person, place, and time.       ED Course/Procedures     Procedures  Results for orders placed or performed during the hospital encounter of 12/12/19  Respiratory Panel by RT PCR (Flu A&B, Covid) - Nasopharyngeal Swab   Specimen: Nasopharyngeal Swab  Result Value Ref Range   SARS Coronavirus 2 by RT PCR NEGATIVE NEGATIVE   Influenza A by PCR NEGATIVE NEGATIVE   Influenza B by PCR NEGATIVE NEGATIVE  CBC  Result Value Ref Range   WBC 3.1 (L) 4.0 - 10.5 K/uL   RBC 4.42 4.22 - 5.81 MIL/uL   Hemoglobin 14.1 13.0 - 17.0 g/dL   HCT 43.2 39.0 - 52.0 %   MCV 97.7 80.0 - 100.0 fL   MCH 31.9 26.0 - 34.0 pg   MCHC 32.6 30.0 - 36.0 g/dL   RDW  13.9 11.5 - 15.5 %   Platelets 163 150 - 400 K/uL   nRBC 0.0 0.0 - 0.2 %  Comprehensive metabolic panel  Result Value Ref Range   Sodium 142 135 - 145 mmol/L   Potassium 4.4 3.5 - 5.1 mmol/L   Chloride 106 98 - 111 mmol/L   CO2 21 (L) 22 - 32 mmol/L   Glucose, Bld 97 70 - 99 mg/dL   BUN 9 8 - 23 mg/dL   Creatinine, Ser 1.00 0.61 - 1.24 mg/dL   Calcium 9.4 8.9 - 10.3 mg/dL   Total Protein 7.5 6.5 - 8.1 g/dL   Albumin 4.0 3.5 - 5.0 g/dL   AST 53 (H) 15 - 41 U/L   ALT 40 0 - 44 U/L   Alkaline Phosphatase 63 38 - 126 U/L   Total Bilirubin 0.7 0.3 - 1.2 mg/dL   GFR calc non Af Amer >60 >60 mL/min   GFR calc Af Amer >60 >60 mL/min   Anion gap 15 5 - 15  I-stat chem 8, ED (not at [facility] or [facility])  Result  Value Ref Range   Sodium 142 135 - 145 mmol/L   Potassium 5.1 3.5 - 5.1 mmol/L   Chloride 108 98 - 111 mmol/L   BUN 10 8 - 23 mg/dL   Creatinine, Ser 1.20 0.61 - 1.24 mg/dL   Glucose, Bld 90 70 - 99 mg/dL   Calcium, Ion 1.11 (L) 1.15 - 1.40 mmol/L   TCO2 27 22 - 32 mmol/L   Hemoglobin 14.6 13.0 - 17.0 g/dL   HCT 43.0 39.0 - 52.0 %  Troponin I (High Sensitivity)  Result Value Ref Range   Troponin I (High Sensitivity) 32 (H) <18 ng/L  Troponin I (High Sensitivity)  Result Value Ref Range   Troponin I (High Sensitivity) 55 (H) <18 ng/L   DG Chest Port 1 View  Result Date: 12/12/2019 CLINICAL DATA:  Chest pain and shortness of breath. EXAM: PORTABLE CHEST 1 VIEW COMPARISON:  Radiograph 12/25/2018. CT 12/08/2017 FINDINGS: Stable hyperinflation. Chronic scarring at the right lung base with multiple remote right rib fractures. Normal heart size with unchanged mediastinal contours. No acute airspace disease. No pulmonary edema or pneumothorax. IMPRESSION: 1. No acute chest findings. 2. Chronic scarring at the right lung base with chronic hyperinflation. Electronically Signed   By: Keith Rake M.D.   On: 12/12/2019 05:51   CT Angio Chest/Abd/Pel for Dissection W and/or Wo Contrast  Result Date: 12/12/2019 CLINICAL DATA:  Chest pain with acute AA or syndrome suspected. EXAM: CT ANGIOGRAPHY CHEST, ABDOMEN AND PELVIS TECHNIQUE: Multidetector CT imaging through the chest, abdomen and pelvis was performed using the standard protocol during bolus administration of intravenous contrast. Multiplanar reconstructed images and MIPs were obtained and reviewed to evaluate the vascular anatomy. CONTRAST:  157mL OMNIPAQUE IOHEXOL 350 MG/ML SOLN COMPARISON:  CT of the chest abdomen pelvis 12/08/2017 FINDINGS: CTA CHEST FINDINGS Cardiovascular: Normal heart size. No pericardial effusion. Extensive coronary calcification. Milder aortic atherosclerotic calcification. There is no aortic aneurysm or dissection. No  intramural hematoma on noncontrast phase. Good opacification of the pulmonary arteries which is negative for filling defect. Mediastinum/Nodes: No adenopathy or mass. Lungs/Pleura: Suspicious spiculated nodule in the right upper lobe measuring 11 mm, essentially new from prior. There is posttraumatic subpleural scarring in the right lung. Emphysema and airway thickening. Musculoskeletal: Remote right rib fractures with healed deformity. No acute or aggressive finding. Review of the MIP images confirms the above findings. CTA ABDOMEN AND PELVIS  FINDINGS VASCULAR Aorta: Diffuse atherosclerotic plaque of the aorta. Negative for aneurysm or dissection. Celiac: Atherosclerotic calcification. SMA: Diffuse atherosclerotic plaque along the proximal SMA. No branch occlusion, aneurysm, or dissection. Renals: Mild atherosclerotic plaque. No aneurysm or dissection. No flow limiting stenosis IMA: Atheromatous but patent Inflow: Diffuse atherosclerotic plaque without high-grade narrowing of the common or external iliac. No dissection or aneurysm. Veins: Negative in the arterial phase Review of the MIP images confirms the above findings. NON-VASCULAR Hepatobiliary: No focal liver abnormality.No evidence of biliary obstruction or stone. Pancreas: Unremarkable. Spleen: Unremarkable. Adrenals/Urinary Tract: Negative adrenals. No hydronephrosis or ureteral stone. Symmetric perinephric stranding which is nonspecific and chronic appearing. Unremarkable bladder. Stomach/Bowel:  No obstruction. No evidence of bowel inflammation Lymphatic: No mass or adenopathy. Reproductive:Negative Other: No ascites or pneumoperitoneum. Musculoskeletal: No acute abnormalities. 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 Generalized osteopenia. Remote greater trochanter proximal femur fracture on the right Review of the MIP images confirms the above findings. IMPRESSION: 1. 11 mm spiculated nodule in the right upper lobe, new  from 2019 and most likely bronchogenic carcinoma. Recommend multi disciplinary thoracic oncology referral. 2. No acute finding including evidence of acute aortic syndrome. 3. Aortic Atherosclerosis (ICD10-I70.0). Extensive coronary atherosclerosis. 4.  Emphysema (ICD10-J43.9). Electronically Signed   By: Monte Fantasia M.D.   On: 12/12/2019 07:27    EKG Interpretation  Date/Time:  Thursday December 12 2019 05:05:22 EST Ventricular Rate:  66 PR Interval:    QRS Duration: 79 QT Interval:  401 QTC Calculation: 421 R Axis:   82 Text Interpretation: Age not entered, assumed to be  70 years old for purpose of ECG interpretation Sinus rhythm Consider left atrial enlargement Consider left ventricular hypertrophy improve rate No acute changes Confirmed by Addison Lank [phone]) on 12/12/2019 7:46:02 AM      See Repeat EKG in Dr. Eugenio Hoes note.   8:00 AM Cardiac monitoring reveals HSR HR 81 (Rate & rhythm), as reviewed and interpreted by me. Cardiac monitoring was ordered due to chest pain and to monitor patient for dysrhythmia.   CRITICAL CARE Performed by: Rodney Booze   Total critical care time: 32 minutes  Critical care time was exclusive of separately billable procedures and treating other patients.  Critical care was necessary to treat or prevent imminent or life-threatening deterioration.  Critical care was time spent personally by me on the following activities: development of treatment plan with patient and/or surrogate as well as nursing, discussions with consultants, evaluation of patient's response to treatment, examination of patient, obtaining history from patient or surrogate, ordering and performing treatments and interventions, ordering and review of laboratory studies, ordering and review of radiographic studies, pulse oximetry and re-evaluation of patient's condition.   MDM   Briefly, pt with multiple medical problems and three-vessel CAD with heavily calcified coronary  arteries managed medically presenting with chest pain.   Reviewed labs  CBC with mild leukopenia, otherwise reassuring CMP with mild elevation to AST, otherwise nonacute Initial trop marginally elevated at 32, delta trop is increased to 55  Initial EKG with Age not entered, assumed to be  69 years old for purpose of ECG interpretation Sinus rhythm Consider left atrial enlargement Consider left ventricular hypertrophy improve rate No acute changes   Repeat EKG personally reviewed and appears to have worsening twave depressions.  - discussed w/ Dr. Wilson Singer who reviewed EKG and recommends starting IV heparin  CT dissection protocol with 11 mm spiculated nodule in the right upper lobe, new  from 2019 and most likely bronchogenic carcinoma. No acute finding including evidence of acute aortic syndrome. Aortic Atherosclerosis. Extensive coronary atherosclerosis. Emphysema  Discussed findings of CT chest/abdomen/pelvis with the patient including the spiculated nodule that is concerning for new carcinoma.  Patient voices understanding he will need to follow-up about this for further work-up.  Given patients complex h/o CAD and chest pain with elevated troponin, dynamic EKG changes, he will require admission for trending of his troponins and possible cath.   8:05 AM CONSULT with cardiology who recommends starting heparin. Cards will see the patient in the ED and likely admit.   Pt admitted to cardiology service with plan for cath.   Lucas [name] 12/12/19 1135    Lucas Manifold, MD 12/12/19 1148

## 2019-12-12 NOTE — ED Notes (Signed)
Care endorsed to Santiago Glad, [place]

## 2019-12-12 NOTE — Progress Notes (Signed)
  Echocardiogram 2D Echocardiogram has been performed.  Lucas [name] 12/12/2019, 5:35 PM

## 2019-12-12 NOTE — Consult Note (Addendum)
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 subpleural scarring in the right lung. Emphysema and airway thickening. Musculoskeletal: Remote right rib fractures with healed deformity. No acute or aggressive finding. Review of the MIP images confirms the above findings. CTA ABDOMEN AND PELVIS FINDINGS VASCULAR Aorta: Diffuse atherosclerotic plaque of the aorta. Negative for aneurysm or dissection. Celiac: Atherosclerotic calcification. SMA: Diffuse atherosclerotic plaque along the proximal SMA. No branch occlusion, aneurysm, or dissection. Renals: Mild atherosclerotic plaque. No aneurysm or dissection. No flow limiting stenosis IMA: Atheromatous but patent Inflow: Diffuse atherosclerotic plaque without high-grade narrowing of the common or external iliac. No dissection or aneurysm. Veins: Negative in the arterial phase Review of the MIP images confirms the above findings. NON-VASCULAR Hepatobiliary: No focal liver abnormality.No evidence of biliary obstruction or stone. Pancreas: Unremarkable. Spleen: Unremarkable. Adrenals/Urinary Tract: Negative adrenals. No hydronephrosis or ureteral stone.  Symmetric perinephric stranding which is nonspecific and chronic appearing. Unremarkable bladder. Stomach/Bowel:  No obstruction. No evidence of bowel inflammation Lymphatic: No mass or adenopathy. Reproductive:Negative Other: No ascites or pneumoperitoneum. Musculoskeletal: No acute abnormalities. 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 Generalized osteopenia. Remote greater trochanter proximal femur fracture on the right Review of the MIP images confirms the above findings. IMPRESSION: 1. 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 Recommend multi disciplinary thoracic oncology referral. 2. No acute finding including evidence of acute aortic syndrome. 3. Aortic Atherosclerosis (ICD10-I70.0). Extensive coronary atherosclerosis. 4.  Emphysema (ICD10-J43.9). Electronically Signed   By: Monte Fantasia M.D.   On: 12/12/2019 07:27   Assessment and Plan:  This is a 69 year old male with  1. Right upper lobe lung mass: The patient was noted to have a right upper lobe lung mass on CT scan. CT of the abdomen and pelvis were performed in addition to the CT of the chest which did not show any evidence of metastatic disease.  The patient is under consideration for possible cardiothoracic surgery.  If possible to remove this lung mass during this procedure, would recommend going ahead and doing so.  If this is not possible, then we can work this mass up further as an outpatient.  2. Unstable angina: Status post cardiac catheterization on 12/12/2019. Further management per cardiology.  3. Tobacco abuse: Smoking cessation was encouraged.  4. Alcohol abuse: He was encouraged to reduce his alcohol intake.  Thank you for this referral.   Lucas Bussing, DNP, AGPCNP-BC, AOCNP  Patient seen and examined and agree with note above.  69 year old man with history of fall coronary artery disease in the COPD as well as tobacco use  admitted for unstable angina.  CT angiogram of the chest abdomen and pelvis showed an 11 mm spiculated nodules in the right upper lobe which is new compared to imaging studies in 2019.  He underwent catheterization and feels reasonably fair after his procedure.  He denies any chest pain or shortness of breath at this time.  He denies any cough or hemoptysis.  On exam, he was awake alert appeared without any distress.  Heart was regular rate without any murmurs lungs are clear with a soft abdomen.  CT scan was personally reviewed which included an extensive evaluation of the abdomen and pelvis without any clear-cut  metastatic disease.  Impression and recommendation  69 year old gentleman with history of coronary artery disease and COPD with an incidental 11 mm spiculated nodule in the right upper lobe which could be suspicious for lung neoplasm.  Management options were reviewed at this time.  Given his high risk of this being a malignant tumor, primary surgical therapy would be reasonable if he is felt would be a candidate by thoracic surgery.  If he is not a lung surgery candidate because of cardiac or pulmonary reasons, outpatient evaluation with percutaneous biopsy and possible radiation therapy approach if this is malignant would be an alternative approach.  If cardiac bypass surgery is recommended, I see no reason not to proceed with such surgery based on his pulmonary nodule.  As I mentioned, this can be addressed at a later date if lung surgery cannot be performed at this time.  Please call with any questions regarding this pleasant gentleman.  60  minutes was spent with the patient face-to-face today.  More than 50% of time was spent on reviewing imaging studies, discussing treatment options and coordinating plan of care.

## 2019-12-12 NOTE — Progress Notes (Signed)
ANTICOAGULATION CONSULT NOTE - Initial Consult  Pharmacy Consult for heparin Indication: chest pain/ACS  Allergies  Allergen Reactions  . Ace Inhibitors Swelling  . Ampicillin Nausea And Vomiting  . Penicillins Nausea And Vomiting    Did it involve swelling of the face/tongue/throat, SOB, or low BP? N Did it involve sudden or severe rash/hives, skin peeling, or any reaction on the inside of your mouth or nose? N Did you need to seek medical attention at a hospital or doctor's office? N When did it last happen?Teenager If all above answers are "NO", may proceed with cephalosporin use.  . Codeine Nausea And Vomiting  . Lisinopril Swelling    REACTION: angioedema    Patient Measurements: Height: 5\' 11"  (180.3 cm) Weight: 150 lb (68 kg) IBW/kg (Calculated) : 75.3 Heparin Dosing Weight: 68 kg  Vital Signs: Temp: 97.7 F (36.5 C) (02/18 0439) Temp Source: Oral (02/18 0439) BP: 134/77 (02/18 0800) Pulse Rate: 79 (02/18 0800)  Labs: Recent Labs    12/12/19 0517 12/12/19 0534  HGB 14.1 14.6  HCT 43.2 43.0  PLT 163  --   CREATININE 1.00 1.20  TROPONINIHS 32*  --     Estimated Creatinine Clearance: 55.9 mL/min (by C-G formula based on SCr of 1.2 mg/dL).   Medical History: Past Medical History:  Diagnosis Date  . Acute blood loss anemia 06/21/2018  . Acute upper GI bleed 06/21/2018  . Allergy   . Bilateral AVN of femurs ([place]) 06/25/2018   See 11/2017 Chest CT report  . COPD 12/21/2006  . Environmental allergies   . History of femur fracture 06/25/2018   11/2017 CT AP showed Remote nonunited right femur greater trochanter fracture  . History of Fracture of right tibia 06/26/2018  . History of multiple right thoacic rib fractures 06/25/2018  . History of Right clavicular fracture 06/25/2018  . HYPERLIPIDEMIA 12/21/2006  . HYPERTENSION, BENIGN SYSTEMIC 12/21/2006  . Mallory-Weiss tear   . TOBACCO DEPENDENCE 12/21/2006    Medications:  Scheduled:  .  morphine injection  4  mg Intravenous Once    Assessment: 30 yom presenting with chest pain radiating to back - no AC PTA.  CT neg for dissection.   Hgb 14.6, plt 163. Trop 32. No s/sx of bleeding noted- notes indicate hx of upper GIB.    Goal of Therapy:  Heparin level 0.3-0.7 units/ml Monitor platelets by anticoagulation protocol: Yes   Plan:  Give 4000 units bolus x 1 Start heparin infusion at 800 units/hr Check anti-Xa level in 6 hours and daily while on heparin Continue to monitor H&H and platelets  Antonietta Jewel, PharmD, [facility] Clinical Pharmacist  Phone: [phone]  Please check AMION for all [facility] phone numbers After 10:00 PM, call [place] [phone] 12/12/2019,8:17 AM

## 2019-12-12 NOTE — ED Notes (Signed)
PIV initiated, 18G to R forearm. IV flushes with 10cc NS without s/s of infiltration. Secured with tape and tegaderm. Positive blood return noted. Labs drawn and labeled with 2 pt identifiers.

## 2019-12-12 NOTE — Interval H&P Note (Signed)
Cath Lab Visit (complete for each Cath Lab visit)  Clinical Evaluation Leading to the Procedure:   ACS: Yes.   NSTEMI  Non-ACS:  n/a   History and Physical Interval Note:  12/12/2019 11:58 AM  Lucas [name]  has presented today for surgery, with the diagnosis of unstable angina.  The various methods of treatment have been discussed with the patient and family. After consideration of risks, benefits and other options for treatment, the patient has consented to  Procedure(s): LEFT HEART CATH AND CORONARY ANGIOGRAPHY (N/A) as a surgical intervention.  The patient's history has been reviewed, patient examined, no change in status, stable for surgery.  I have reviewed the patient's chart and labs.  Questions were answered to the patient's satisfaction.     Kathlyn [place]

## 2019-12-12 NOTE — Progress Notes (Signed)
TR BAND REMOVAL  LOCATION:    Radial rt radial  DEFLATED PER PROTOCOL:   yes  TIME BAND OFF / DRESSING APPLIED:    1500/gauze and tegaderm  SITE UPON ARRIVAL:    Level 0  SITE AFTER BAND REMOVAL:    Level 0  CIRCULATION SENSATION AND MOVEMENT:    Within Normal Limits : yes, rt hand and fingers warm and pink, sensation present. Rt arm remains elevated on pillow; instructions reviewed w/patient  COMMENTS:

## 2019-12-12 NOTE — ED Notes (Signed)
cxr at bedside

## 2019-12-12 NOTE — ED Triage Notes (Signed)
Pt brought in by EMS for c/o burning midsternal CP, SOB, and Back pain. Pt has hx of COPD and took 1.2 mg of SL nitro and 2 puffs of home albuterol inhaler prior to EMS arrival.  Per EMS, pt wheezing and tachypneic into 30s upon arrival, satting at 90% on RA. EMS had pt take two more puffs of inhaler with noted improvement of wheezing, RR, and sats to 98% RA. Pt A&Ox4, speaking in full sentences. Breathing easy, non-labored. VSS on continuous monitors. Reports CP decreased from a 10/10 to 3/10 after receiving aspirin en route. Hx of cardiac catheterization last year. Smokes a pack of cigarettes a day

## 2019-12-12 NOTE — ED Notes (Signed)
Cardiology at bedside.

## 2019-12-12 NOTE — ED Provider Notes (Addendum)
Briefly, the patient is a 69 y.o. male with extensive past medical history including alcohol abuse.  Upper GI bleeds, COPD, hypertension, vasculopath who presents to the emergency department with chest pain radiating to the back.   Vitals:   12/12/19 0600 12/12/19 0630  BP: 105/63 135/73  Pulse: 64 88  Resp: 19 20  Temp:    SpO2: 96% 97%    CONSTITUTIONAL: Chronically ill-appearing, NAD NEURO:  Alert and oriented x 3, no focal deficits EYES:  pupils equal and reactive ENT/NECK:  trachea midline, no JVD CARDIO: Regular rate, regular rhythm, well-perfused PULM: Mildly labored breathing GI/GU:  Abdomin non-distended MSK/SPINE:  No gross deformities, no edema SKIN:  no rash, atraumatic PSYCH:  Appropriate speech and behavior  EKG without acute ischemic changes. Initial troponin mildly elevated. CTA obtained to rule out dissection and PE. With the labs reassuring.  Signed out to oncoming team to follow up CTA. Will need admission for chest pain and trop trending if CTA negative.   CRITICAL CARE Performed by: Grayce Sessions Bernardette Waldron Total critical care time:30 minutes Critical care time was exclusive of separately billable procedures and treating other patients. Critical care was necessary to treat or prevent imminent or life-threatening deterioration. Critical care was time spent personally by me on the following activities: development of treatment plan with patient and/or surrogate as well as nursing, discussions with consultants, evaluation of patient's response to treatment, examination of patient, obtaining history from patient or surrogate, ordering and performing treatments and interventions, ordering and review of laboratory studies, ordering and review of radiographic studies, pulse oximetry and re-evaluation of patient's condition.      Fatima Blank, MD 12/12/19 [id number]    Fatima Blank, MD 12/19/19 [phone]

## 2019-12-13 ENCOUNTER — Inpatient Hospital Stay (HOSPITAL_COMMUNITY): Payer: Medicare Other

## 2019-12-13 ENCOUNTER — Other Ambulatory Visit (HOSPITAL_COMMUNITY): Payer: Medicare Other

## 2019-12-13 DIAGNOSIS — I2510 Atherosclerotic heart disease of native coronary artery without angina pectoris: Secondary | ICD-10-CM

## 2019-12-13 DIAGNOSIS — R222 Localized swelling, mass and lump, trunk: Secondary | ICD-10-CM

## 2019-12-13 DIAGNOSIS — I10 Essential (primary) hypertension: Secondary | ICD-10-CM

## 2019-12-13 LAB — URINALYSIS, ROUTINE W REFLEX MICROSCOPIC
Bilirubin Urine: NEGATIVE
Glucose, UA: NEGATIVE mg/dL
Hgb urine dipstick: NEGATIVE
Ketones, ur: NEGATIVE mg/dL
Leukocytes,Ua: NEGATIVE
Nitrite: NEGATIVE
Protein, ur: NEGATIVE mg/dL
Specific Gravity, Urine: 1.021 (ref 1.005–1.030)
pH: 6 (ref 5.0–8.0)

## 2019-12-13 LAB — PROTIME-INR
INR: 1.1 (ref 0.8–1.2)
Prothrombin Time: 13.9 seconds (ref 11.4–15.2)

## 2019-12-13 LAB — PULMONARY FUNCTION TEST
FEF 25-75 Pre: 0.42 L/sec
FEF2575-%Pred-Pre: 23 %
FEV1-%Pred-Pre: 65 %
FEV1-Pre: 1.32 L
FEV1FVC-%Pred-Pre: 62 %
FEV6-%Pred-Pre: 96 %
FEV6-Pre: 2.48 L
FEV6FVC-%Pred-Pre: 95 %
FVC-%Pred-Pre: 101 %
FVC-Pre: 2.76 L
Pre FEV1/FVC ratio: 48 %
Pre FEV6/FVC Ratio: 90 %

## 2019-12-13 LAB — HEMOGLOBIN A1C
Hgb A1c MFr Bld: 6.1 % — ABNORMAL HIGH (ref 4.8–5.6)
Mean Plasma Glucose: 128.37 mg/dL

## 2019-12-13 LAB — CBC
HCT: 35.2 % — ABNORMAL LOW (ref 39.0–52.0)
Hemoglobin: 12 g/dL — ABNORMAL LOW (ref 13.0–17.0)
MCH: 31.5 pg (ref 26.0–34.0)
MCHC: 34.1 g/dL (ref 30.0–36.0)
MCV: 92.4 fL (ref 80.0–100.0)
Platelets: 149 10*3/uL — ABNORMAL LOW (ref 150–400)
RBC: 3.81 MIL/uL — ABNORMAL LOW (ref 4.22–5.81)
RDW: 13.5 % (ref 11.5–15.5)
WBC: 3.5 10*3/uL — ABNORMAL LOW (ref 4.0–10.5)
nRBC: 0 % (ref 0.0–0.2)

## 2019-12-13 LAB — HEPARIN LEVEL (UNFRACTIONATED)
Heparin Unfractionated: 0.21 IU/mL — ABNORMAL LOW (ref 0.30–0.70)
Heparin Unfractionated: 0.37 IU/mL (ref 0.30–0.70)

## 2019-12-13 LAB — BASIC METABOLIC PANEL
Anion gap: 10 (ref 5–15)
BUN: 8 mg/dL (ref 8–23)
CO2: 23 mmol/L (ref 22–32)
Calcium: 9.3 mg/dL (ref 8.9–10.3)
Chloride: 104 mmol/L (ref 98–111)
Creatinine, Ser: 0.86 mg/dL (ref 0.61–1.24)
GFR calc Af Amer: >60 mL/min (ref >60)
GFR calc non Af Amer: >60 mL/min (ref >60)
Glucose, Bld: 120 mg/dL — ABNORMAL HIGH (ref 70–99)
Potassium: 3.8 mmol/L (ref 3.5–5.1)
Sodium: 137 mmol/L (ref 135–145)

## 2019-12-13 LAB — APTT: aPTT: 88 seconds — ABNORMAL HIGH (ref 24–36)

## 2019-12-13 LAB — SURGICAL PCR SCREEN
MRSA, PCR: NEGATIVE
Staphylococcus aureus: NEGATIVE

## 2019-12-13 NOTE — Consult Note (Signed)
[address]       [place] [address]             [phone]        [facility] Medical Record #[id number] Date of Birth: Oct 25, 1950  Referring: No ref. provider found Primary Care: [name], MD Primary Cardiologist:Peter Martinique, MD  Chief Complaint:    Chief Complaint  Patient presents with  . Chest Pain  . Shortness of Breath    History of Present Illness:     69 yo male with multiple medical problems is admitted due to chest pain.  He has a hx of CAD, and was lost to follow-up last year after being admitted with a Mallory-Weiss tear.  He underwent a LHC which shows severe 3V CAD.  Additionally, on cross-sectional imaging, he was noted to have a 50mm RUL pulmonary nodule concerning for primary lung cancer given his significant smoking hx.    In regards to his symptoms, he has been having exertional chest pain and shortness of breath with ambulation for over 84yr.    He has had multiple surgeries to both lower extremities, and the left wrist in the past as well.    Past Medical History:  Diagnosis Date  . Acute blood loss anemia 06/21/2018  . Acute upper GI bleed 06/21/2018  . Allergy   . Bilateral AVN of femurs ([place]) 06/25/2018   See 11/2017 Chest CT report  . COPD 12/21/2006  . Environmental allergies   . History of femur fracture 06/25/2018   11/2017 CT AP showed Remote nonunited right femur greater trochanter fracture  . History of Fracture of right tibia 06/26/2018  . History of multiple right thoacic rib fractures 06/25/2018  . History of Right clavicular fracture 06/25/2018  . HYPERLIPIDEMIA 12/21/2006  . HYPERTENSION, BENIGN SYSTEMIC 12/21/2006  . Mallory-Weiss tear   . TOBACCO DEPENDENCE 12/21/2006    Past Surgical History:  Procedure Laterality Date  . ANKLE SURGERY     left ankle  . APPLICATION OF WOUND VAC  01/06/2012   Procedure: APPLICATION OF WOUND VAC;  Surgeon: Meredith Pel, MD;  Location: [facility];  Service:  Orthopedics;  Laterality: Left;  . APPLICATION OF WOUND VAC Right 10/17/2015   Procedure: APPLICATION OF WOUND VAC;  Surgeon: Leandrew Koyanagi, MD;  Location: [facility];  Service: Orthopedics;  Laterality: Right;  . BIOPSY  06/22/2018   Procedure: BIOPSY;  Surgeon: Dorothea Ogle v, DO;  Location: [facility];  Service: Gastroenterology;;  . ESOPHAGOGASTRODUODENOSCOPY (EGD) WITH PROPOFOL N/A 06/22/2018   Procedure: ESOPHAGOGASTRODUODENOSCOPY (EGD) WITH PROPOFOL;  Surgeon: [id number] v, DO;  Location: [facility];  Service: Gastroenterology;  Laterality: N/A;  . EXTERNAL FIXATION LEG  01/06/2012   Procedure: EXTERNAL FIXATION LEG;  Surgeon: Meredith Pel, MD;  [facility]: [facility];  Service: Orthopedics;  Laterality: Left;  Smith-nephew external fixator set  . EXTERNAL FIXATION LEG Right 09/16/2015   Procedure: EXTERNAL FIXATION LEG/LARGE;  Surgeon: Mcarthur Rossetti, MD;  Location: [facility];  Service: Orthopedics;  Laterality: Right;  . EXTERNAL FIXATION REMOVAL Right 09/22/2015   Procedure: REMOVAL EXTERNAL FIXATION LEG;  Surgeon: Mcarthur Rossetti, MD;  Location: [facility];  Service: Orthopedics;  Laterality: Right;  . FASCIOTOMY  01/10/2012   Procedure: FASCIOTOMY;  Surgeon: Rozanna Box, MD;  Location: [facility];  Service: Orthopedics;  Laterality: Left;  status post fasciotomy with wound vac left calf; adjustment external fixator left tibia under fluoro; retention suture/wound vac placement left lateral  calf wound  . FASCIOTOMY  01/06/2012   Procedure: FASCIOTOMY;  Surgeon: Meredith Pel, MD;  [facility]: [facility];  Service: Orthopedics;  Laterality: Left;  Four compartment fasciotomy  . HOT HEMOSTASIS N/A 06/22/2018   Procedure: HEMOSTASIS;  Surgeon: [id number] v, DO;  Location: [facility];  Service: Gastroenterology;  Laterality: N/A;  Epinepherine and clip placed  . I & D EXTREMITY  01/12/2012   Procedure: IRRIGATION AND DEBRIDEMENT EXTREMITY;  Surgeon: Rozanna Box, MD;   Location: [facility];  Service: Orthopedics;  Laterality: Left;  LAYERD CLOSURE LEFT LEG WOUND  . I & D EXTREMITY Right 10/17/2015   Procedure: IRRIGATION AND DEBRIDEMENT EXTREMITY;  Surgeon: Leandrew Koyanagi, MD;  Location: [facility];  Service: Orthopedics;  Laterality: Right;  . I & D EXTREMITY Right 10/20/2015   Procedure: REPEAT IRRIGATION AND DEBRIDEMENT EXTREMITY, right lower leg;  Surgeon: Mcarthur Rossetti, MD;  Location: [facility];  Service: Orthopedics;  Laterality: Right;  . KNEE SURGERY     left knee and right knee  . LEFT HEART CATH AND CORONARY ANGIOGRAPHY N/A 06/27/2018   Procedure: LEFT HEART CATH AND CORONARY ANGIOGRAPHY;  Surgeon: Martinique, Peter M, MD;  Location: [facility];  Service: Cardiovascular;  Laterality: N/A;  . LEFT HEART CATH AND CORONARY ANGIOGRAPHY N/A 12/12/2019   Procedure: LEFT HEART CATH AND CORONARY ANGIOGRAPHY;  Surgeon: Wellington Hampshire, MD;  Location: [facility];  Service: Cardiovascular;  Laterality: N/A;  . MASS EXCISION Left 09/22/2015   Procedure: EXCISION MASS;  Surgeon: Mcarthur Rossetti, MD;  Location: [facility];  Service: Orthopedics;  Laterality: Left;  . ORIF TIBIA PLATEAU  02/07/2012   Procedure: OPEN REDUCTION INTERNAL FIXATION (ORIF) TIBIAL PLATEAU;  Surgeon: Rozanna Box, MD;  Location: [facility];  Service: Orthopedics;  Laterality: Left;  ORIF LEFT TIBIAL PLATEAU/ REMOVE EXTERNAL FIXATION LEFT LEG  . ORIF TIBIA PLATEAU Right 09/22/2015   Procedure: REMOVAL OF EXTERNAL FIXATOR RIGHT LEG, OPEN REDUCTION INTERNAL FIXATION (ORIF) RIGHT TIBIAL PLATEAU, EXCISION SMALL MASS LEFT LEG;  Surgeon: Mcarthur Rossetti, MD;  Location: [facility];  Service: Orthopedics;  Laterality: Right;  . SKIN SPLIT GRAFT Right 10/23/2015   Procedure: RIGHT GASTROC FLAP SPLIT THICKNESS SKIN GRAFT FROM RIGHT THING TO RIGHT LEG;  Surgeon: Irene Limbo, MD;  Location: [facility];  Service: Plastics;  Laterality: Right;  . WRIST SURGERY     left    Social History    Tobacco Use  Smoking Status Current Every Day Smoker  . Packs/day: 0.50  . Years: 51.00  . Pack years: 25.50  . Types: Cigarettes  Smokeless Tobacco Former Systems developer  . Types: Snuff, Chew  . Quit date: 01/05/1965    Social History   Substance and Sexual Activity  Alcohol Use Yes  . Alcohol/week: 49.0 standard drinks  . Types: 49 Cans of beer per week   Comment: drinks about a 6-pack/day     Allergies  Allergen Reactions  . Ace Inhibitors Swelling  . Ampicillin Nausea And Vomiting  . Penicillins Nausea And Vomiting    Did it involve swelling of the face/tongue/throat, SOB, or low BP? N Did it involve sudden or severe rash/hives, skin peeling, or any reaction on the inside of your mouth or nose? N Did you need to seek medical attention at a hospital or doctor's office? N When did it last happen?Teenager If all above answers are "NO", may proceed with cephalosporin use.  . Codeine Nausea And Vomiting  . Lisinopril Swelling  REACTION: angioedema      Current Facility-Administered Medications  Medication Dose Route Frequency Provider Last Rate Last Admin  . 0.9 %  sodium chloride infusion  250 mL Intravenous PRN Wellington Hampshire, MD      . acetaminophen (TYLENOL) tablet 650 mg  650 mg Oral Q4H PRN Kathlyn Sacramento A, MD      . albuterol (PROVENTIL) (2.5 MG/3ML) 0.083% nebulizer solution 2.5 mg  2.5 mg Nebulization Q6H PRN Wellington Hampshire, MD      . ALPRAZolam Duanne Moron) tablet 0.25 mg  0.25 mg Oral BID PRN Barrett, Rhonda G, PA-C      . aspirin chewable tablet 81 mg  81 mg Oral Daily Kathlyn Sacramento A, MD   81 mg at 12/13/19 1139  . cholecalciferol (VITAMIN D3) tablet 1,000 Units  1,000 Units Oral Daily Wellington Hampshire, MD   1,000 Units at 12/13/19 1138  . fluticasone (FLONASE) 50 MCG/ACT nasal spray 2 spray  2 spray Each Nare Daily Wellington Hampshire, MD   2 spray at 12/13/19 1140  . folic acid (FOLVITE) tablet 1 mg  1 mg Oral Daily Barrett, Rhonda G, PA-C   1 mg at  12/13/19 1140  . heparin ADULT infusion 100 units/mL (25000 units/256mL sodium chloride 0.45%)  900 Units/hr Intravenous Continuous Jettie Booze, MD 9 mL/hr at 12/13/19 0404 900 Units/hr at 12/13/19 0404  . loratadine (CLARITIN) tablet 10 mg  10 mg Oral Daily Kathlyn Sacramento A, MD   10 mg at 12/13/19 1138  . LORazepam (ATIVAN) tablet 1-4 mg  1-4 mg Oral Q1H PRN Barrett, Evelene Croon, PA-C       Or  . LORazepam (ATIVAN) injection 1-4 mg  1-4 mg Intravenous Q1H PRN Barrett, Rhonda G, PA-C      . metoprolol tartrate (LOPRESSOR) tablet 25 mg  25 mg Oral BID Kathlyn Sacramento A, MD   25 mg at 12/13/19 1139  . montelukast (SINGULAIR) tablet 10 mg  10 mg Oral QHS Wellington Hampshire, MD   10 mg at 12/12/19 2103  . morphine 4 MG/ML injection 4 mg  4 mg Intravenous Once Wellington Hampshire, MD      . multivitamin with minerals tablet 1 tablet  1 tablet Oral Daily Barrett, Evelene Croon, PA-C   1 tablet at 12/13/19 1137  . nitroGLYCERIN (NITROSTAT) SL tablet 0.4 mg  0.4 mg Sublingual Q5 min PRN Kathlyn Sacramento A, MD      . nitroGLYCERIN 50 mg in dextrose 5 % 250 mL (0.2 mg/mL) infusion  0-200 mcg/min Intravenous Titrated Kathlyn Sacramento A, MD 1.5 mL/hr at 12/12/19 2110 5 mcg/min at 12/12/19 2110  . ondansetron (ZOFRAN) injection 4 mg  4 mg Intravenous Q6H PRN Kathlyn Sacramento A, MD      . pantoprazole (PROTONIX) EC tablet 40 mg  40 mg Oral BID Kathlyn Sacramento A, MD   40 mg at 12/13/19 1138  . rosuvastatin (CRESTOR) tablet 20 mg  20 mg Oral Daily Kathlyn Sacramento A, MD   20 mg at 12/13/19 1141  . sodium chloride flush (NS) 0.9 % injection 3 mL  3 mL Intravenous Q12H Kathlyn Sacramento A, MD   3 mL at 12/13/19 1141  . sodium chloride flush (NS) 0.9 % injection 3 mL  3 mL Intravenous PRN Kathlyn Sacramento A, MD      . thiamine tablet 100 mg  100 mg Oral Daily Barrett, Rhonda G, PA-C   100 mg at 12/13/19 1137   Or  .  thiamine (B-1) injection 100 mg  100 mg Intravenous Daily Barrett, Rhonda G, PA-C      .  umeclidinium-vilanterol (ANORO ELLIPTA) 62.5-25 MCG/INH 1 puff  1 puff Inhalation Daily Kathlyn Sacramento A, MD   1 puff at 12/13/19 0835  . zolpidem (AMBIEN) tablet 5 mg  5 mg Oral QHS PRN Barrett, Evelene Croon, PA-C        Medications Prior to Admission  Medication Sig Dispense Refill Last Dose  . albuterol (PROAIR HFA) 108 (90 Base) MCG/ACT inhaler Inhale 2 puffs into the lungs every 6 (six) hours as needed for wheezing or shortness of breath. 16 g 6 12/11/2019 at Unknown time  . cetirizine (ZYRTEC) 10 MG tablet TAKE 1 TABLET BY MOUTH EVERY DAY (Patient taking differently: Take 10 mg by mouth daily. ) 90 tablet 3 Past Week at Unknown time  . Cholecalciferol (VITAMIN D3) 400 units tablet Take 2 tablets (800 Units total) by mouth daily. 180 tablet 3 Past Month at Unknown time  . fluticasone (FLONASE) 50 MCG/ACT nasal spray SPRAY 2 SPRAYS INTO EACH NOSTRIL EVERY DAY (Patient taking differently: Place 2 sprays into both nostrils daily. ) 16 g 2 Past Week at Unknown time  . isosorbide mononitrate (IMDUR) 30 MG 24 hr tablet TAKE 1 TABLET BY MOUTH EVERY DAY (Patient taking differently: Take 30 mg by mouth daily. ) 90 tablet 3 Past Week at Unknown time  . metoprolol tartrate (LOPRESSOR) 25 MG tablet TAKE 1 TABLET BY MOUTH TWICE A DAY (Patient taking differently: Take 25 mg by mouth 2 (two) times daily. ) 180 tablet 0 12/11/2019 at 1800  . montelukast (SINGULAIR) 10 MG tablet TAKE 1 TABLET BY MOUTH EVERYDAY AT BEDTIME (Patient taking differently: Take 10 mg by mouth at bedtime. ) 90 tablet 1 12/11/2019 at 1800  . nitroGLYCERIN (NITROSTAT) 0.4 MG SL tablet Place 1 tablet (0.4 mg total) under the tongue every 5 (five) minutes as needed for chest pain. 50 tablet 3 12/12/2019 at Unknown time  . rosuvastatin (CRESTOR) 20 MG tablet Take 1 tablet (20 mg total) by mouth daily. 90 tablet 3 Past Week at Unknown time  . umeclidinium-vilanterol (ANORO ELLIPTA) 62.5-25 MCG/INH AEPB Inhale 1 puff into the lungs daily. 60 each 3  12/11/2019 at Unknown time  . pantoprazole (PROTONIX) 40 MG tablet Take 1 tablet (40 mg total) by mouth 2 (two) times daily. 108 tablet 0   . predniSONE (DELTASONE) 20 MG tablet 2 tabs po daily x 4 days 8 tablet 0   . sucralfate (CARAFATE) 1 GM/10ML suspension Take 10 mLs (1 g total) by mouth 4 (four) times daily for 3 days. 420 mL 0     Family History  Problem Relation Age of Onset  . Emphysema Father      Review of Systems:   Review of Systems  Constitutional: Positive for malaise/fatigue. Negative for weight loss.  HENT: Negative.   Respiratory: Positive for cough and shortness of breath.   Cardiovascular: Positive for chest pain. Negative for orthopnea and leg swelling.  Gastrointestinal: Negative.   Musculoskeletal: Positive for joint pain.  Neurological: Negative.       Physical Exam: BP 127/81   Pulse 71   Temp 98.2 F (36.8 C) (Oral)   Resp 18   Ht 5\' 11"  (1.803 m)   Wt 62.9 kg   SpO2 100%   BMI 19.34 kg/m  Physical Exam  Constitutional: He is oriented to person, place, and time and well-developed, well-nourished, and in no distress. No distress.  HENT:  Poor dentition   Eyes: EOM are normal. No scleral icterus.  Neck: No tracheal deviation present.  Cardiovascular: Normal rate and regular rhythm.  Good radius and ulnar pulse in the left wrist  Pulmonary/Chest: Effort normal. No respiratory distress.  Abdominal: He exhibits no distension.  Musculoskeletal:     Comments: Multiple incisions and skin graft to bilateral incisions Left wrist incision  Neurological: He is alert and oriented to person, place, and time.  Skin: Skin is warm and dry. He is not diaphoretic.         I have independently reviewed the above radiologic studies and discussed with the patient   Recent Lab Findings: Lab Results  Component Value Date   WBC 3.5 (L) 12/13/2019   HGB 12.0 (L) 12/13/2019   HCT 35.2 (L) 12/13/2019   PLT 149 (L) 12/13/2019   GLUCOSE 120 (H) 12/13/2019     CHOL 96 06/27/2018   TRIG 56 06/27/2018   HDL 48 06/27/2018   LDLDIRECT 105 (H) 05/16/2017   LDLCALC 37 06/27/2018   ALT 40 12/12/2019   AST 53 (H) 12/12/2019   NA 137 12/13/2019   K 3.8 12/13/2019   CL 104 12/13/2019   CREATININE 0.86 12/13/2019   BUN 8 12/13/2019   CO2 23 12/13/2019   TSH 1.285 11/03/2006   INR 1.00 06/24/2018      Assessment / Plan:   69 yo male with severe 3V CAD, 1.1cm RUL pulmonary nodule, ongoing smoking use, and questionable conduit.  In regards to his coronary disease, his vessels are heavily calcified, but suitable for bypass.  The problem will likely be that he does not have enough conduit for complete revascularization if his vein conduits are not adequate.  He will require at least 4 bypass, and thus will plan for billateral IMA, and open right radial harvest.    In regards to the pulmonary nodule, combined CABG and lung resection is ill-advised.  He has severe bullous disease, and without a diagnosis, or adequate staging, surgical resection on a heparinized patient would put him at increased risk.  I will arrange for him to undergo a CT guided biopsy during this admission for diagnosis.  Given that he does not plan to stop smoking, he likely will be a better candidate for SBRT once he recovers from surgery.  He is tentatively scheduled for CABG on 2/22 pending his echo, and [place] studies.  Farrell Ours 12/13/2019 1:38 PM

## 2019-12-13 NOTE — Progress Notes (Signed)
[facility] for heparin Indication: chest pain/ACS  Allergies  Allergen Reactions  . Ace Inhibitors Swelling  . Ampicillin Nausea And Vomiting  . Penicillins Nausea And Vomiting    Did it involve swelling of the face/tongue/throat, SOB, or low BP? N Did it involve sudden or severe rash/hives, skin peeling, or any reaction on the inside of your mouth or nose? N Did you need to seek medical attention at a hospital or doctor's office? N When did it last happen?Teenager If all above answers are "NO", may proceed with cephalosporin use.  . Codeine Nausea And Vomiting  . Lisinopril Swelling    REACTION: angioedema    Patient Measurements: Height: 5\' 11"  (180.3 cm) Weight: 138 lb 10.7 oz (62.9 kg) IBW/kg (Calculated) : 75.3 Heparin Dosing Weight: 68 kg  Vital Signs: Temp: 98.2 F (36.8 C) (02/19 0406) Temp Source: Oral (02/19 0406) BP: 127/81 (02/19 1137) Pulse Rate: 71 (02/19 1137)  Labs: Recent Labs    12/12/19 0517 12/12/19 0517 12/12/19 0534 12/12/19 0723 12/13/19 0331 12/13/19 1101  HGB 14.1   < > 14.6  --  12.0*  --   HCT 43.2  --  43.0  --  35.2*  --   PLT 163  --   --   --  149*  --   HEPARINUNFRC  --   --   --   --  0.21* 0.37  CREATININE 1.00  --  1.20  --   --   --   TROPONINIHS 32*  --   --  55*  --   --    < > = values in this interval not displayed.    Estimated Creatinine Clearance: 51.7 mL/min (by C-G formula based on SCr of 1.2 mg/dL).   Medical History: Past Medical History:  Diagnosis Date  . Acute blood loss anemia 06/21/2018  . Acute upper GI bleed 06/21/2018  . Allergy   . Bilateral AVN of femurs ([place] [facility]) 06/25/2018   See 11/2017 Chest CT report  . COPD 12/21/2006  . Environmental allergies   . History of femur fracture 06/25/2018   11/2017 CT AP showed Remote nonunited right femur greater trochanter fracture  . History of Fracture of right tibia 06/26/2018  . History of multiple right thoacic rib fractures  06/25/2018  . History of Right clavicular fracture 06/25/2018  . HYPERLIPIDEMIA 12/21/2006  . HYPERTENSION, BENIGN SYSTEMIC 12/21/2006  . Mallory-Weiss tear   . TOBACCO DEPENDENCE 12/21/2006    Medications:  Scheduled:  . aspirin  81 mg Oral Daily  . cholecalciferol  1,000 Units Oral Daily  . fluticasone  2 spray Each Nare Daily  . folic acid  1 mg Oral Daily  . loratadine  10 mg Oral Daily  . metoprolol tartrate  25 mg Oral BID  . montelukast  10 mg Oral QHS  .  morphine injection  4 mg Intravenous Once  . multivitamin with minerals  1 tablet Oral Daily  . pantoprazole  40 mg Oral BID  . rosuvastatin  20 mg Oral Daily  . sodium chloride flush  3 mL Intravenous Q12H  . thiamine  100 mg Oral Daily   Or  . thiamine  100 mg Intravenous Daily  . umeclidinium-vilanterol  1 puff Inhalation Daily    Assessment: 52 yom presenting with chest pain radiating to back - no AC PTA.  CT neg for dissection.   S/p cath this afternoon showing multivessel CAD. Orders to restart heparin tonight. Consult pending  possible surgery. Heparin level this afternoon is therapeutic.   Goal of Therapy:  Heparin level 0.3-0.7 units/ml Monitor platelets by anticoagulation protocol: Yes   Plan:  -Continue heparin 900 units/h -Daily heparin level and CBC   Arrie Senate, PharmD, BCPS Clinical Pharmacist [phone] Please check AMION for all [facility] numbers 12/13/2019

## 2019-12-13 NOTE — Progress Notes (Signed)
1400-1430 Discussed with pt the importance of walking and IS after surgery. Gave IS and pt only able to get to 750 ml. Discussed sternal precautions and staying in the tube. Gave handout. Pt stated his wife will be available to stay with him after discharge. Gave [facility] booklet and wrote down how to view pre op video. Since pt has LM disease, did not ambulate. Will follow up after surgery. Graylon Good RN BSN 12/13/2019 2:27 PM

## 2019-12-13 NOTE — Progress Notes (Signed)
ANTICOAGULATION CONSULT NOTE  Pharmacy Consult for heparin Indication: chest pain/ACS  Assessment: 64 yom presenting with chest pain radiating to back - no AC PTA.  CT neg for dissection.   Hgb 14.6, plt 163. Trop 32. No s/sx of bleeding noted- notes indicate hx of upper GIB.   S/p cath this afternoon showing multivessel CAD. Orders to restart heparin tonight. Consult pending possible surgery.  Initial heparin level 0.21 units/ml   Goal of Therapy:  Heparin level 0.3-0.7 units/ml Monitor platelets by anticoagulation protocol: Yes   Plan:  Increase heparin to 900 units/hr Heparin level in ~ 6 hours  Thanks for allowing pharmacy to be a part of this patient's care.  Excell Seltzer, PharmD Clinical Pharmacist 12/13/2019 4:02 AM

## 2019-12-13 NOTE — Progress Notes (Signed)
  Request seen for image guided biopsy of lung nodule.  Chart reviewed.  Oncology note reads: Management options were reviewed at this time.  Given his high risk of this being a malignant tumor, primary surgical therapy would be reasonable if he is felt would be a candidate by thoracic surgery.  If he is not a lung surgery candidate because of cardiac or pulmonary reasons, outpatient evaluation with percutaneous biopsy and possible radiation therapy approach if this is malignant would be an alternative approach.  If cardiac bypass surgery is recommended, I see no reason not to proceed with such surgery based on his pulmonary nodule.  As I mentioned, this can be addressed at a later date if lung surgery cannot be performed at this time.  Images reviewed by Dr. Daryll Brod.  Recommend work up after CABG.  He needs outpatient PET.   If positive and he is a surgical candidate, recommend resection.    If he is not a surgical candidate, he can be considered for radiation since its a new nodule.    He may not need a bx.  He is moderate to high risk of complication with biosy with empysema and small size at 67mm.  Direct any questions to IR MD at [phone].  Larraine Argo S Reakwon Barren PA-C 12/13/2019 1:31 PM

## 2019-12-13 NOTE — Progress Notes (Addendum)
Progress Note  Patient Name: Lucas [name] Date of Encounter: 12/13/2019  Primary Cardiologist: Peter Martinique, MD   Subjective   No acute overnight events. No recurrent chest pain. Patient notes some shortness of breath this morning when he went to the restroom. Also had dry cough this morning after waking up which is chronic for him. No palpitations, lightheadedness, or dizziness.   Inpatient Medications    Scheduled Meds: . aspirin  81 mg Oral Daily  . cholecalciferol  1,000 Units Oral Daily  . fluticasone  2 spray Each Nare Daily  . folic acid  1 mg Oral Daily  . loratadine  10 mg Oral Daily  . metoprolol tartrate  25 mg Oral BID  . montelukast  10 mg Oral QHS  .  morphine injection  4 mg Intravenous Once  . multivitamin with minerals  1 tablet Oral Daily  . pantoprazole  40 mg Oral BID  . rosuvastatin  20 mg Oral Daily  . sodium chloride flush  3 mL Intravenous Q12H  . thiamine  100 mg Oral Daily   Or  . thiamine  100 mg Intravenous Daily  . umeclidinium-vilanterol  1 puff Inhalation Daily   Continuous Infusions: . sodium chloride    . heparin 900 Units/hr (12/13/19 0404)  . nitroGLYCERIN 5 mcg/min (12/12/19 2110)   PRN Meds: sodium chloride, acetaminophen, albuterol, ALPRAZolam, LORazepam **OR** LORazepam, nitroGLYCERIN, ondansetron (ZOFRAN) IV, sodium chloride flush, zolpidem   Vital Signs    Vitals:   12/13/19 0000 12/13/19 0200 12/13/19 0406 12/13/19 0548  BP: 118/67 129/82 134/72   Pulse: 77 68 71   Resp: 20 (!) 24 16   Temp: 98.3 F (36.8 C)  98.2 F (36.8 C)   TempSrc: Oral  Oral   SpO2: 97% 98% 97%   Weight:    62.9 kg  Height:        Intake/Output Summary (Last 24 hours) at 12/13/2019 5638 Last data filed at 12/12/2019 2350 Gross per 24 hour  Intake 266.29 ml  Output 750 ml  Net -483.71 ml   Last 3 Weights 12/13/2019 12/12/2019 01/21/2019  Weight (lbs) 138 lb 10.7 oz 150 lb 160 lb  Weight (kg) 62.9 kg 68.04 kg 72.576 kg      Telemetry     Normal sinus rhythm with rates mainly in the 60's to 90's but occasionally in the low 100's. Also had short 6 beat run of non-sustained VT. - Personally Reviewed  ECG    No new ECG tracing. - Personally Reviewed  Physical Exam   GEN: No acute distress.   Eyes: Bilateral nystagmus noted. Neck: Supple. Cardiac: RRR. No murmurs, rubs, or gallops. Right radial cath site soft with no signs of hematoma. Radial pulses 2+ and equal bilaterally. Respiratory: Clear to auscultation bilaterally. No wheezes, rhonchi, or rales, appreciated. GI: Soft, non-distended, and non-tender. Bowel sounds present. MS: No lower extremity edema. No deformity. Skin: Warm and dry. Neuro:  No focal deficits. Psych: Normal affect. Responds appropriately.   Labs    High Sensitivity Troponin:   Recent Labs  Lab 12/12/19 0517 12/12/19 0723  TROPONINIHS 32* 55*      Chemistry Recent Labs  Lab 12/12/19 0517 12/12/19 0534  NA 142 142  K 4.4 5.1  CL 106 108  CO2 21*  --   GLUCOSE 97 90  BUN 9 10  CREATININE 1.00 1.20  CALCIUM 9.4  --   PROT 7.5  --   ALBUMIN 4.0  --  AST 53*  --   ALT 40  --   ALKPHOS 63  --   BILITOT 0.7  --   GFRNONAA >60  --   GFRAA >60  --   ANIONGAP 15  --      Hematology Recent Labs  Lab 12/12/19 0517 12/12/19 0534 12/13/19 0331  WBC 3.1*  --  3.5*  RBC 4.42  --  3.81*  HGB 14.1 14.6 12.0*  HCT 43.2 43.0 35.2*  MCV 97.7  --  92.4  MCH 31.9  --  31.5  MCHC 32.6  --  34.1  RDW 13.9  --  13.5  PLT 163  --  149*    BNPNo results for input(s): BNP, PROBNP in the last 168 hours.   DDimer No results for input(s): DDIMER in the last 168 hours.   Radiology    CARDIAC CATHETERIZATION  Addendum Date: 12/12/2019    Dist LM to Prox LAD lesion is 80% stenosed.  Mid LM to Dist LM lesion is 50% stenosed.  Ost 2nd Diag lesion is 100% stenosed.  Ost Cx to Prox Cx lesion is 85% stenosed.  Prox RCA lesion is 60% stenosed.  Mid RCA-1 lesion is 99% stenosed.  Mid  RCA-2 lesion is 50% stenosed.  The left ventricular systolic function is normal.  LV end diastolic pressure is mildly elevated.  The left ventricular ejection fraction is 50-55% by visual estimate.  Ost LM lesion is 40% stenosed.  Prox Cx to Mid Cx lesion is 95% stenosed.  Ost 1st Diag lesion is 60% stenosed.  1.  Severe heavily calcified three-vessel coronary artery disease. 2.  Normal LV systolic function and mildly elevated left ventricular end-diastolic pressure. Recommendations: The coronary arteries are not suitable for PCI due to heavy diffuse calcifications.  Recommend evaluation for CABG especially that the patient has right lung mass which will likely require resection at the same time.  The patient is also a poor candidate for dual antiplatelet therapy.  There are good distal targets for CABG. Also recommend oncology consultation to ensure he does not have metastatic disease before committing to CABG.   Result Date: 12/12/2019  Dist LM to Prox LAD lesion is 80% stenosed.  Mid LM to Dist LM lesion is 50% stenosed.  Ost 2nd Diag lesion is 100% stenosed.  Ost Cx to Prox Cx lesion is 85% stenosed.  Prox RCA lesion is 60% stenosed.  Mid RCA-1 lesion is 99% stenosed.  Mid RCA-2 lesion is 50% stenosed.  The left ventricular systolic function is normal.  LV end diastolic pressure is mildly elevated.  The left ventricular ejection fraction is 50-55% by visual estimate.  Ost LM lesion is 40% stenosed.  Prox Cx to Mid Cx lesion is 95% stenosed.  Ost 1st Diag lesion is 60% stenosed.  1.  Severe heavily calcified three-vessel coronary artery disease. 2.  Normal LV systolic function and mildly elevated left ventricular end-diastolic pressure. Recommendations: The coronary arteries are not suitable for PCI due to heavy diffuse calcifications.  Recommend evaluation for CABG especially that the patient has right lung mass which will likely require resection at the same time.  The patient is also a  poor candidate for dual antiplatelet therapy.  There are good distal targets for CABG.   DG Chest Port 1 View  Result Date: 12/12/2019 CLINICAL DATA:  Chest pain and shortness of breath. EXAM: PORTABLE CHEST 1 VIEW COMPARISON:  Radiograph 12/25/2018. CT 12/08/2017 FINDINGS: Stable hyperinflation. Chronic scarring at the right lung base  with multiple remote right rib fractures. Normal heart size with unchanged mediastinal contours. No acute airspace disease. No pulmonary edema or pneumothorax. IMPRESSION: 1. No acute chest findings. 2. Chronic scarring at the right lung base with chronic hyperinflation. Electronically Signed   By: Keith Rake M.D.   On: 12/12/2019 05:51   CT Angio Chest/Abd/Pel for Dissection W and/or Wo Contrast  Result Date: 12/12/2019 CLINICAL DATA:  Chest pain with acute AA or syndrome suspected. EXAM: CT ANGIOGRAPHY CHEST, ABDOMEN AND PELVIS TECHNIQUE: Multidetector CT imaging through the chest, abdomen and pelvis was performed using the standard protocol during bolus administration of intravenous contrast. Multiplanar reconstructed images and MIPs were obtained and reviewed to evaluate the vascular anatomy. CONTRAST:  166mL OMNIPAQUE IOHEXOL 350 MG/ML SOLN COMPARISON:  CT of the chest abdomen pelvis 12/08/2017 FINDINGS: CTA CHEST FINDINGS Cardiovascular: Normal heart size. No pericardial effusion. Extensive coronary calcification. Milder aortic atherosclerotic calcification. There is no aortic aneurysm or dissection. No intramural hematoma on noncontrast phase. Good opacification of the pulmonary arteries which is negative for filling defect. Mediastinum/Nodes: No adenopathy or mass. Lungs/Pleura: Suspicious spiculated nodule in the right upper lobe measuring 11 mm, essentially new from prior. There is posttraumatic subpleural scarring in the right lung. Emphysema and airway thickening. Musculoskeletal: Remote right rib fractures with healed deformity. No acute or aggressive  finding. Review of the MIP images confirms the above findings. CTA ABDOMEN AND PELVIS FINDINGS VASCULAR Aorta: Diffuse atherosclerotic plaque of the aorta. Negative for aneurysm or dissection. Celiac: Atherosclerotic calcification. SMA: Diffuse atherosclerotic plaque along the proximal SMA. No branch occlusion, aneurysm, or dissection. Renals: Mild atherosclerotic plaque. No aneurysm or dissection. No flow limiting stenosis IMA: Atheromatous but patent Inflow: Diffuse atherosclerotic plaque without high-grade narrowing of the common or external iliac. No dissection or aneurysm. Veins: Negative in the arterial phase Review of the MIP images confirms the above findings. NON-VASCULAR Hepatobiliary: No focal liver abnormality.No evidence of biliary obstruction or stone. Pancreas: Unremarkable. Spleen: Unremarkable. Adrenals/Urinary Tract: Negative adrenals. No hydronephrosis or ureteral stone. Symmetric perinephric stranding which is nonspecific and chronic appearing. Unremarkable bladder. Stomach/Bowel:  No obstruction. No evidence of bowel inflammation Lymphatic: No mass or adenopathy. Reproductive:Negative Other: No ascites or pneumoperitoneum. Musculoskeletal: No acute abnormalities. 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 Generalized osteopenia. Remote greater trochanter proximal femur fracture on the right Review of the MIP images confirms the above findings. IMPRESSION: 1. 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 Recommend multi disciplinary thoracic oncology referral. 2. No acute finding including evidence of acute aortic syndrome. 3. Aortic Atherosclerosis (ICD10-I70.0). Extensive coronary atherosclerosis. 4.  Emphysema (ICD10-J43.9). Electronically Signed   By: Monte Fantasia M.D.   On: 12/12/2019 07:27    Cardiac Studies   Cardial Catheterization 12/12/2019:  Dist LM to Prox LAD lesion is 80% stenosed.  Mid  LM to Dist LM lesion is 50% stenosed.  Ost 2nd Diag lesion is 100% stenosed.  Ost Cx to Prox Cx lesion is 85% stenosed.  Prox RCA lesion is 60% stenosed.  Mid RCA-1 lesion is 99% stenosed.  Mid RCA-2 lesion is 50% stenosed.  The left ventricular systolic function is normal.  LV end diastolic pressure is mildly elevated.  The left ventricular ejection fraction is 50-55% by visual estimate.  Ost LM lesion is 40% stenosed.  Prox Cx to Mid Cx lesion is 95% stenosed.  Ost 1st Diag lesion is 60% stenosed.   1.  Severe  heavily calcified three-vessel coronary artery disease. 2.  Normal LV systolic function and mildly elevated left ventricular end-diastolic pressure.  Recommendations: The coronary arteries are not suitable for PCI due to heavy diffuse calcifications.  Recommend evaluation for CABG especially that the patient has right lung mass which will likely require resection at the same time.  The patient is also a poor candidate for dual antiplatelet therapy.  There are good distal targets for CABG. Also recommend oncology consultation to ensure he does not have metastatic disease before committing to CABG.  Patient Profile   Mr. Coy is a 69 y.o. male with a history of multiple vessel CAD noted on cardiac cath in 06/2018 being treated medically, COPD with chronic bronchitis, COVID-19 infection in 07/2019,  hypertension, hyperlipidemia, tobacco abuse, alcohol abuse, hematemesis with Mack Guise tear in 05/2018 who was admitted for chest pain on 12/12/2019. Cardiac catheterization showed severe heavily calcified 3 vessel CAD. CT surgery was consulted for possible CABG.  Assessment & Plan    Unstable Angina with Severe 3-Vessel CAD - High-sensitivity troponin minimally elevated at 32 >> 52. - Cardiac catheterization yesterday showed severe heavily calcified 3 vessel CAD with no good PCI options. CT surgery was consulted for possible CABG but have not seen patient yet. Oncology was  also consulted given pulmonary nodule concerning for malignancy noted on CT scan on admission. Per Oncology, "If cardiac bypass surgery is recommend, I see no reason not to proceed with such surgery based on his pulmonary nodule." - Echo pending. - No recurrent angina. - Continue IV Heparin and IV Nitro. - Continue Aspirin 81mg  daily, Lopressor 25mg  twice daily, and Crestor 20mg  daily.  Non-Sustained VT - Brief 6 beat run of non-sustained VT noted on telemetry. - Patient denies any palpitations. - Magnesium 2.2 yesterday.  - Today's BMET pending. - Continue Lopressor as above. Could increase to 37.5mg  twice daily if needed.   Hypertension - BP currently well controlled. - Continue Lopressor as above.  Hyperlipidemia - Lipid panel pending. - Continue home Crestor 20mg  daily.  Tobacco Abuse - Will continue to encourage complete cessation. - Nicotine patch during admission.  Alcohol Abuse - On CIWA protocol.  Presumed Lung Cancer - CT scan on admission showed 80mm spiculated nodule in the right upper lobe most likely bronchogenic carcinoma. New from 2019. - Oncology was consulted. Per Oncology note, "Given his high risk of this being a malignant tumor, primary surgical therapy would be reasonable if he is felt to be a candidate for thoracic surgery. If he is not a lung surgery candidate because of cardiac or pulmonary reasons, outpatient evaluation can be completed."  COPD - Continue inhalers as needed.   For questions or updates, please contact [place] Please consult [email or web address] for contact info under        Signed, Darreld Mclean, PA-C  12/13/2019, 6:42 AM    I have examined the patient and reviewed assessment and plan and discussed with patient.  Agree with above as stated.  Await eval from CT surgery for CABG.  Oncology feeling that surgical management of likely lung CA can be considered as well.  COntinue IV heparin, along with aggressive secondary  prevention.   I encouraged the patient to have his wife on the phone when the surgeon comes in.  He is anxious about the idea of surgery but I explained to him that this was the best option for his longterm cardiac health.    He needs to stop smoking as well.  Larae Grooms

## 2019-12-13 NOTE — Plan of Care (Signed)
  Problem: Cardiovascular: Goal: Ability to achieve and maintain adequate cardiovascular perfusion will improve Outcome: Progressing

## 2019-12-13 NOTE — Progress Notes (Addendum)
Attempted ABG x 2.  1st attempt- pt pulled arm back before hub of needle was fully inserted.  2nd attempt, pt was very tense and couldn't relax and hollering that "it hurts".  Pt said that he wants to wait to have area numbed for ABG.  I was unable to obtain ABG.

## 2019-12-14 ENCOUNTER — Other Ambulatory Visit (HOSPITAL_COMMUNITY): Payer: Medicare Other

## 2019-12-14 LAB — CBC
HCT: 36.7 % — ABNORMAL LOW (ref 39.0–52.0)
Hemoglobin: 12.5 g/dL — ABNORMAL LOW (ref 13.0–17.0)
MCH: 31.6 pg (ref 26.0–34.0)
MCHC: 34.1 g/dL (ref 30.0–36.0)
MCV: 92.9 fL (ref 80.0–100.0)
Platelets: 155 10*3/uL (ref 150–400)
RBC: 3.95 MIL/uL — ABNORMAL LOW (ref 4.22–5.81)
RDW: 13.2 % (ref 11.5–15.5)
WBC: 3.5 10*3/uL — ABNORMAL LOW (ref 4.0–10.5)
nRBC: 0 % (ref 0.0–0.2)

## 2019-12-14 LAB — BLOOD GAS, ARTERIAL
Acid-Base Excess: 1 mmol/L (ref 0.0–2.0)
Bicarbonate: 24.6 mmol/L (ref 20.0–28.0)
FIO2: 21
O2 Saturation: 97.5 %
Patient temperature: 37
pCO2 arterial: 36.4 mmHg (ref 32.0–48.0)
pH, Arterial: 7.445 (ref 7.350–7.450)
pO2, Arterial: 94.4 mmHg (ref 83.0–108.0)

## 2019-12-14 LAB — HEPARIN LEVEL (UNFRACTIONATED)
Heparin Unfractionated: 0.94 IU/mL — ABNORMAL HIGH (ref 0.30–0.70)
Heparin Unfractionated: 0.83 IU/mL — ABNORMAL HIGH (ref 0.30–0.70)
Heparin Unfractionated: 0.51 IU/mL (ref 0.30–0.70)

## 2019-12-14 LAB — ECHOCARDIOGRAM COMPLETE
Height: 71 in
Weight: 2400 oz

## 2019-12-14 MED ORDER — POTASSIUM CHLORIDE CRYS ER 20 MEQ PO TBCR
20.0000 meq | EXTENDED_RELEASE_TABLET | Freq: Once | ORAL | Status: AC
  Administered 2019-12-14: 20 meq via ORAL
  Filled 2019-12-14: qty 1

## 2019-12-14 MED ORDER — LIDOCAINE-PRILOCAINE 2.5-2.5 % EX CREA
TOPICAL_CREAM | Freq: Once | CUTANEOUS | Status: AC
  Filled 2019-12-14 (×2): qty 5
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
## 2019-12-14 NOTE — Progress Notes (Signed)
ANTICOAGULATION CONSULT NOTE  Pharmacy Consult for heparin Indication: chest pain/ACS   Assessment: 3 yom presenting with chest pain radiating to back - no AC PTA.  CT neg for dissection.   Heparin level this morning elevated.  No issues noted with infusion.  No bleeding   Goal of Therapy:  Heparin level 0.3-0.7 units/ml Monitor platelets by anticoagulation protocol: Yes   Plan:  -Decrease heparin 800 units/h Check heparin level in 6-8 hours -Daily heparin level and CBC Thanks for allowing pharmacy to be a part of this patient's care.  Excell Seltzer, PharmD Clinical Pharmacist 12/14/2019

## 2019-12-14 NOTE — Progress Notes (Signed)
Responded to Spiritual Consult regarding Mr. Lucas [name] mother having died this morning.  Upon entering the room found Mr. Lucas [name] sitting up watching TV.  Initiated a relationship of care and concern, acknowledging I had heard his mother had died and wondering how he is doing.  Mr. Lucas [name] said his mother was 70 years old and suffered from dementia in a nursing home.  And while it may sound funny, he was relieved she had gone on to her [employer].  He had been worrying about her and caring for her.  And now that he is in hospital he is relieved he doesn't have to worry about her anymore and can focus of getting better. Validated his feelings and affirmed his spiritual strengths.  Mr. Lucas [name] is not worried about his bypass surgery on Monday as doctor has assured him he will do fine.    Chaplain stands by to be of assistance as needed.  De Burrs Chaplain Resident

## 2019-12-14 NOTE — Progress Notes (Signed)
ANTICOAGULATION CONSULT NOTE - Follow Up Consult  Pharmacy Consult for Heparin Indication: chest pain/ACS  Allergies  Allergen Reactions  . Ace Inhibitors Swelling  . Ampicillin Nausea And Vomiting  . Penicillins Nausea And Vomiting    Did it involve swelling of the face/tongue/throat, SOB, or low BP? N Did it involve sudden or severe rash/hives, skin peeling, or any reaction on the inside of your mouth or nose? N Did you need to seek medical attention at a hospital or doctor's office? N When did it last happen?Teenager If all above answers are "NO", may proceed with cephalosporin use.  . Codeine Nausea And Vomiting  . Lisinopril Swelling    REACTION: angioedema    Patient Measurements: Height: 5\' 11"  (180.3 cm) Weight: 140 lb (63.5 kg) IBW/kg (Calculated) : 75.3 Heparin Dosing Weight:  63.5 kg  Vital Signs: Temp: 97.9 F (36.6 C) (02/20 1200) Temp Source: Oral (02/20 1200) BP: 136/93 (02/20 1200) Pulse Rate: 77 (02/20 0956)  Labs: Recent Labs    12/12/19 0517 12/12/19 0517 12/12/19 0534 12/12/19 0534 12/12/19 0723 12/13/19 0331 12/13/19 0331 12/13/19 1101 12/13/19 1800 12/14/19 0349 12/14/19 1118  HGB 14.1   < > 14.6   < >  --  12.0*  --   --   --  12.5*  --   HCT 43.2   < > 43.0  --   --  35.2*  --   --   --  36.7*  --   PLT 163  --   --   --   --  149*  --   --   --  155  --   APTT  --   --   --   --   --   --   --   --  88*  --   --   LABPROT  --   --   --   --   --   --   --   --  13.9  --   --   INR  --   --   --   --   --   --   --   --  1.1  --   --   HEPARINUNFRC  --   --   --   --   --  0.21*   < > 0.37  --  0.94* 0.83*  CREATININE 1.00  --  1.20  --   --   --   --  0.86  --   --   --   TROPONINIHS 32*  --   --   --  55*  --   --   --   --   --   --    < > = values in this interval not displayed.    Estimated Creatinine Clearance: 72.8 mL/min (by C-G formula based on SCr of 0.86 mg/dL).  Assessment: Anticoag: Hep gtt for ACS - no AC PTA.  HL 0.94>0.83. Hgb 12.5 stable. Plts 155 stable.  - S/p cath 2/18 - heavily calcified 3v CAD, cvts consult for CABG  Goal of Therapy:  Heparin level 0.3-0.7 units/ml Monitor platelets by anticoagulation protocol: Yes   Plan:  Decrease IV heparin to 700 units/hr Recheck level in 8 hrs. Daily HL and CBC CABG on 2/22    Khairi Garman S. Alford Highland, PharmD, BCPS Clinical Staff Pharmacist [email or web address] Alford Highland, [employer] 12/14/2019,12:53 PM

## 2019-12-14 NOTE — Progress Notes (Signed)
ANTICOAGULATION CONSULT NOTE - Follow Up Consult  Pharmacy Consult for Heparin Indication: chest pain/ACS  Allergies  Allergen Reactions  . Ace Inhibitors Swelling  . Ampicillin Nausea And Vomiting  . Penicillins Nausea And Vomiting    Did it involve swelling of the face/tongue/throat, SOB, or low BP? N Did it involve sudden or severe rash/hives, skin peeling, or any reaction on the inside of your mouth or nose? N Did you need to seek medical attention at a hospital or doctor's office? N When did it last happen?Teenager If all above answers are "NO", may proceed with cephalosporin use.  . Codeine Nausea And Vomiting  . Lisinopril Swelling    REACTION: angioedema    Patient Measurements: Height: 5\' 11"  (180.3 cm) Weight: 140 lb (63.5 kg) IBW/kg (Calculated) : 75.3 Heparin Dosing Weight:  63.5 kg  Vital Signs: Temp: 98 F (36.7 C) (02/20 2028) Temp Source: Oral (02/20 2028) BP: 151/46 (02/20 2028) Pulse Rate: 82 (02/20 2028)  Labs: Recent Labs    12/12/19 0517 12/12/19 0517 12/12/19 0534 12/12/19 0534 12/12/19 0723 12/13/19 0331 12/13/19 0331 12/13/19 1101 12/13/19 1101 12/13/19 1800 12/14/19 0349 12/14/19 1118 12/14/19 2041  HGB 14.1   < > 14.6   < >  --  12.0*  --   --   --   --  12.5*  --   --   HCT 43.2   < > 43.0  --   --  35.2*  --   --   --   --  36.7*  --   --   PLT 163  --   --   --   --  149*  --   --   --   --  155  --   --   APTT  --   --   --   --   --   --   --   --   --  48*  --   --   --   LABPROT  --   --   --   --   --   --   --   --   --  13.9  --   --   --   INR  --   --   --   --   --   --   --   --   --  1.1  --   --   --   HEPARINUNFRC  --   --   --   --   --  0.21*   < > 0.37   < >  --  0.94* 0.83* 0.51  CREATININE 1.00  --  1.20  --   --   --   --  0.86  --   --   --   --   --   TROPONINIHS 32*  --   --   --  55*  --   --   --   --   --   --   --   --    < > = values in this interval not displayed.    Estimated Creatinine  Clearance: 72.8 mL/min (by C-G formula based on SCr of 0.86 mg/dL).  Assessment: Anticoag: Hep gtt for ACS - no AC PTA. HL 0.94>0.83. Hgb 12.5 stable. Plts 155 stable.  - S/p cath 2/18 - heavily calcified 3v CAD, cvts consult for CABG  PM f/u: Heparin level 0.51 at goal.  No overt bleeding  or complications noted.  Goal of Therapy:  Heparin level 0.3-0.7 units/ml Monitor platelets by anticoagulation protocol: Yes   Plan:  Continue IV heparin at current rate. Daily HL and CBC CABG on 2/22   [place] Crane, Roylene Reason, [facility] Clinical Pharmacist Phone [phone]  12/14/2019 10:02 PM

## 2019-12-14 NOTE — Progress Notes (Signed)
RT came to patient room to follow up on preop ABG ordered on 2/19.  Patient stated to RT that RT cannot stick ABG without numbing medication.  RTs not allowed to provide numbing medication.  Unable to obtain ABG sample at this time.

## 2019-12-15 ENCOUNTER — Inpatient Hospital Stay (HOSPITAL_COMMUNITY): Payer: Medicare Other

## 2019-12-15 DIAGNOSIS — Z01810 Encounter for preprocedural cardiovascular examination: Secondary | ICD-10-CM

## 2019-12-15 LAB — HEPARIN LEVEL (UNFRACTIONATED): Heparin Unfractionated: 0.64 IU/mL (ref 0.30–0.70)

## 2019-12-15 LAB — COMPREHENSIVE METABOLIC PANEL
ALT: 24 U/L (ref 0–44)
AST: 32 U/L (ref 15–41)
Albumin: 3.2 g/dL — ABNORMAL LOW (ref 3.5–5.0)
Alkaline Phosphatase: 58 U/L (ref 38–126)
Anion gap: 10 (ref 5–15)
BUN: 8 mg/dL (ref 8–23)
CO2: 25 mmol/L (ref 22–32)
Calcium: 9.7 mg/dL (ref 8.9–10.3)
Chloride: 103 mmol/L (ref 98–111)
Creatinine, Ser: 0.97 mg/dL (ref 0.61–1.24)
GFR calc Af Amer: >60 mL/min (ref >60)
GFR calc non Af Amer: >60 mL/min (ref >60)
Glucose, Bld: 96 mg/dL (ref 70–99)
Potassium: 3.8 mmol/L (ref 3.5–5.1)
Sodium: 138 mmol/L (ref 135–145)
Total Bilirubin: 0.9 mg/dL (ref 0.3–1.2)
Total Protein: 6.4 g/dL — ABNORMAL LOW (ref 6.5–8.1)

## 2019-12-15 LAB — CBC
HCT: 35.7 % — ABNORMAL LOW (ref 39.0–52.0)
Hemoglobin: 12.1 g/dL — ABNORMAL LOW (ref 13.0–17.0)
MCH: 31.7 pg (ref 26.0–34.0)
MCHC: 33.9 g/dL (ref 30.0–36.0)
MCV: 93.5 fL (ref 80.0–100.0)
Platelets: 160 10*3/uL (ref 150–400)
RBC: 3.82 MIL/uL — ABNORMAL LOW (ref 4.22–5.81)
RDW: 13.1 % (ref 11.5–15.5)
WBC: 3.7 10*3/uL — ABNORMAL LOW (ref 4.0–10.5)
nRBC: 0 % (ref 0.0–0.2)

## 2019-12-15 LAB — LIPID PANEL
Cholesterol: 168 mg/dL (ref 0–200)
HDL: 75 mg/dL (ref >40)
LDL Cholesterol: 87 mg/dL (ref 0–99)
Total CHOL/HDL Ratio: 2.2 RATIO
Triglycerides: 28 mg/dL (ref <150)
VLDL: 6 mg/dL (ref 0–40)

## 2019-12-15 LAB — PREPARE RBC (CROSSMATCH)

## 2019-12-15 LAB — HIV ANTIBODY (ROUTINE TESTING W REFLEX): HIV Screen 4th Generation wRfx: NONREACTIVE — AB

## 2019-12-15 MED ORDER — BISACODYL 5 MG PO TBEC
5.0000 mg | DELAYED_RELEASE_TABLET | Freq: Once | ORAL | Status: AC
  Filled 2019-12-15: qty 1

## 2019-12-15 MED ORDER — NITROGLYCERIN 0.4 MG SL SUBL: 0.4000 mg | SUBLINGUAL_TABLET | SUBLINGUAL | Status: DC | PRN

## 2019-12-15 MED ORDER — VANCOMYCIN HCL 1250 MG/250ML IV SOLN
1250.0000 mg | INTRAVENOUS | Status: AC
  Administered 2019-12-16: 1250 mg via INTRAVENOUS
  Filled 2019-12-15: qty 250

## 2019-12-15 MED ORDER — TRANEXAMIC ACID (OHS) BOLUS VIA INFUSION
15.0000 mg/kg | INTRAVENOUS | Status: AC
  Administered 2019-12-16: 951 mg via INTRAVENOUS
  Filled 2019-12-15: qty 951

## 2019-12-15 MED ORDER — NITROGLYCERIN IN D5W 200-5 MCG/ML-% IV SOLN
2.0000 ug/min | INTRAVENOUS | Status: DC
  Filled 2019-12-15: qty 250

## 2019-12-15 MED ORDER — CHLORHEXIDINE GLUCONATE 0.12 % MT SOLN: 15.0000 mL | Freq: Once | OROMUCOSAL | Status: DC

## 2019-12-15 MED ORDER — CHLORHEXIDINE GLUCONATE CLOTH 2 % EX PADS: 6.0000 | MEDICATED_PAD | Freq: Once | CUTANEOUS | Status: DC

## 2019-12-15 MED ORDER — PLASMA-LYTE 148 IV SOLN
INTRAVENOUS | Status: DC
  Filled 2019-12-15: qty 2.5

## 2019-12-15 MED ORDER — CHLORHEXIDINE GLUCONATE CLOTH 2 % EX PADS
6.0000 | MEDICATED_PAD | Freq: Once | CUTANEOUS | Status: AC
  Administered 2019-12-16: 6 via TOPICAL

## 2019-12-15 MED ORDER — SODIUM CHLORIDE 0.9 % IV SOLN
750.0000 mg | INTRAVENOUS | Status: AC
  Administered 2019-12-16: 750 mg via INTRAVENOUS
  Filled 2019-12-15: qty 750

## 2019-12-15 MED ORDER — MILRINONE LACTATE IN DEXTROSE 20-5 MG/100ML-% IV SOLN
0.3000 ug/kg/min | INTRAVENOUS | Status: DC
  Filled 2019-12-15: qty 100

## 2019-12-15 MED ORDER — TEMAZEPAM 15 MG PO CAPS
15.0000 mg | ORAL_CAPSULE | Freq: Once | ORAL | Status: DC | PRN
  Filled 2019-12-15: qty 1

## 2019-12-15 MED ORDER — METOPROLOL TARTRATE 12.5 MG HALF TABLET: 12.5000 mg | ORAL_TABLET | Freq: Once | ORAL | Status: DC

## 2019-12-15 MED ORDER — SODIUM CHLORIDE 0.9 % IV SOLN
INTRAVENOUS | Status: DC
  Filled 2019-12-15: qty 30

## 2019-12-15 MED ORDER — TEMAZEPAM 15 MG PO CAPS: 15.0000 mg | ORAL_CAPSULE | Freq: Once | ORAL | Status: DC | PRN

## 2019-12-15 MED ORDER — TRANEXAMIC ACID 1000 MG/10ML IV SOLN
1.5000 mg/kg/h | INTRAVENOUS | Status: AC
  Administered 2019-12-16: 1.5 mg/kg/h via INTRAVENOUS
  Filled 2019-12-15: qty 25

## 2019-12-15 MED ORDER — METOPROLOL TARTRATE 12.5 MG HALF TABLET
12.5000 mg | ORAL_TABLET | Freq: Once | ORAL | Status: AC
  Administered 2019-12-16: 12.5 mg via ORAL
  Filled 2019-12-15: qty 1

## 2019-12-15 MED ORDER — PHENYLEPHRINE HCL-NACL 20-0.9 MG/250ML-% IV SOLN
30.0000 ug/min | INTRAVENOUS | Status: AC
  Administered 2019-12-16: 20 ug/min via INTRAVENOUS
  Filled 2019-12-15: qty 250

## 2019-12-15 MED ORDER — SODIUM CHLORIDE 0.9 % IV SOLN
1.5000 g | INTRAVENOUS | Status: AC
  Administered 2019-12-16: 1.5 g via INTRAVENOUS
  Filled 2019-12-15: qty 1.5

## 2019-12-15 MED ORDER — TRANEXAMIC ACID (OHS) PUMP PRIME SOLUTION
2.0000 mg/kg | INTRAVENOUS | Status: DC
  Filled 2019-12-15: qty 1.27

## 2019-12-15 MED ORDER — CHLORHEXIDINE GLUCONATE 0.12 % MT SOLN
15.0000 mL | Freq: Once | OROMUCOSAL | Status: AC
  Administered 2019-12-16: 15 mL via OROMUCOSAL
  Filled 2019-12-15: qty 15

## 2019-12-15 MED ORDER — MAGNESIUM SULFATE 50 % IJ SOLN
40.0000 meq | INTRAMUSCULAR | Status: DC
  Filled 2019-12-15: qty 9.85

## 2019-12-15 MED ORDER — CHLORHEXIDINE GLUCONATE CLOTH 2 % EX PADS
6.0000 | MEDICATED_PAD | Freq: Once | CUTANEOUS | Status: AC
  Administered 2019-12-15: 6 via TOPICAL

## 2019-12-15 MED ORDER — EPINEPHRINE HCL 5 MG/250ML IV SOLN IN NS
0.0000 ug/min | INTRAVENOUS | Status: DC
  Filled 2019-12-15: qty 250

## 2019-12-15 MED ORDER — BISACODYL 5 MG PO TBEC: 5.0000 mg | DELAYED_RELEASE_TABLET | Freq: Once | ORAL | Status: DC

## 2019-12-15 MED ORDER — SODIUM CHLORIDE 0.9 % IV SOLN: 250.0000 mL | INTRAVENOUS | Status: DC | PRN

## 2019-12-15 MED ORDER — NOREPINEPHRINE 4 MG/250ML-% IV SOLN
0.0000 ug/min | INTRAVENOUS | Status: DC
  Filled 2019-12-15: qty 250

## 2019-12-15 MED ORDER — DEXMEDETOMIDINE HCL IN NACL 400 MCG/100ML IV SOLN
0.1000 ug/kg/h | INTRAVENOUS | Status: AC
  Administered 2019-12-16: .5 ug/kg/h via INTRAVENOUS
  Filled 2019-12-15: qty 100

## 2019-12-15 MED ORDER — SODIUM CHLORIDE 0.9% FLUSH: 3.0000 mL | INTRAVENOUS | Status: DC | PRN

## 2019-12-15 MED ORDER — POTASSIUM CHLORIDE 2 MEQ/ML IV SOLN
80.0000 meq | INTRAVENOUS | Status: DC
  Filled 2019-12-15: qty 40

## 2019-12-15 MED ORDER — SODIUM CHLORIDE 0.9% FLUSH
3.0000 mL | Freq: Two times a day (BID) | INTRAVENOUS | Status: DC
  Administered 2019-12-15: 3 mL via INTRAVENOUS

## 2019-12-15 MED ORDER — INSULIN REGULAR(HUMAN) IN NACL 100-0.9 UT/100ML-% IV SOLN
INTRAVENOUS | Status: DC
  Filled 2019-12-15: qty 100

## 2019-12-15 NOTE — Progress Notes (Signed)
Pre-CABG Dopplers and bilateral lower extremity vein mapping completed. Refer to "CV Proc" under chart review to view preliminary results.  12/15/2019 12:29 PM Kelby Aline., MHA, RVT, RDCS, RDMS
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
## 2019-12-15 NOTE — Progress Notes (Signed)
Received a call from blood bank.  They have 2 units of blood ready for him.  Idolina Primer, RN

## 2019-12-15 NOTE — Progress Notes (Signed)
ANTICOAGULATION CONSULT NOTE - Follow Up Consult  Pharmacy Consult for Heparin Indication: chest pain/ACS  Allergies  Allergen Reactions  . Ace Inhibitors Swelling  . Ampicillin Nausea And Vomiting  . Penicillins Nausea And Vomiting    Did it involve swelling of the face/tongue/throat, SOB, or low BP? N Did it involve sudden or severe rash/hives, skin peeling, or any reaction on the inside of your mouth or nose? N Did you need to seek medical attention at a hospital or doctor's office? N When did it last happen?Teenager If all above answers are "NO", may proceed with cephalosporin use.  . Codeine Nausea And Vomiting  . Lisinopril Swelling    REACTION: angioedema    Patient Measurements: Height: 5\' 11"  (180.3 cm) Weight: 139 lb 11.2 oz (63.4 kg) IBW/kg (Calculated) : 75.3 Heparin Dosing Weight:  63.5 kg  Vital Signs: Temp: 98 F (36.7 C) (02/21 0319) Temp Source: Oral (02/21 0319) BP: 145/79 (02/21 0319) Pulse Rate: 77 (02/21 0319)  Labs: Recent Labs     0000 12/13/19 0331 12/13/19 0331 12/13/19 1101 12/13/19 1101 12/13/19 1800 12/14/19 0349 12/14/19 0349 12/14/19 1118 12/14/19 2041 12/15/19 0315  HGB   < > 12.0*  --   --   --   --  12.5*  --   --   --  12.1*  HCT  --  35.2*  --   --   --   --  36.7*  --   --   --  35.7*  PLT  --  149*  --   --   --   --  155  --   --   --  160  APTT  --   --   --   --   --  88*  --   --   --   --   --   LABPROT  --   --   --   --   --  13.9  --   --   --   --   --   INR  --   --   --   --   --  1.1  --   --   --   --   --   HEPARINUNFRC  --  0.21*   < > 0.37   < >  --  0.94*   < > 0.83* 0.51 0.64  CREATININE  --   --   --  0.86  --   --   --   --   --   --  0.97   < > = values in this interval not displayed.    Estimated Creatinine Clearance: 64.5 mL/min (by C-G formula based on SCr of 0.97 mg/dL).  Assessment: Anticoag: Hep gtt for ACS - no AC PTA. HL 0.64 in goal. Hgb 12.1 stable. Plts 160 stable.  - S/p cath 2/18  - heavily calcified 3v CAD, cvts consult for CABG   Goal of Therapy:  Heparin level 0.3-0.7 units/ml Monitor platelets by anticoagulation protocol: Yes   Plan:  Con't IV heparin at 700 units/hr Daily HL and CBC CABG on 2/22     Arbor Cohen S. Alford Highland, PharmD, BCPS Clinical Staff Pharmacist [email or web address] Alford Highland, Matisha Termine Stillinger 12/15/2019,7:46 AM

## 2019-12-16 ENCOUNTER — Inpatient Hospital Stay (HOSPITAL_COMMUNITY): Payer: Medicare Other

## 2019-12-16 ENCOUNTER — Inpatient Hospital Stay (HOSPITAL_COMMUNITY): Payer: Medicare Other | Admitting: Anesthesiology

## 2019-12-16 ENCOUNTER — Encounter (HOSPITAL_COMMUNITY): Payer: Self-pay | Admitting: Interventional Cardiology

## 2019-12-16 ENCOUNTER — Inpatient Hospital Stay (HOSPITAL_COMMUNITY)
Admission: EM | Disposition: A | Discharge status: Home Health | Payer: Self-pay | Source: Home / Self Care | Attending: Thoracic Surgery (Cardiothoracic Vascular Surgery)

## 2019-12-16 ENCOUNTER — Other Ambulatory Visit (HOSPITAL_COMMUNITY): Payer: Medicare Other

## 2019-12-16 ENCOUNTER — Ambulatory Visit: Payer: Medicare Other | Attending: Internal Medicine

## 2019-12-16 DIAGNOSIS — I25110 Atherosclerotic heart disease of native coronary artery with unstable angina pectoris: Secondary | ICD-10-CM

## 2019-12-16 DIAGNOSIS — I2510 Atherosclerotic heart disease of native coronary artery without angina pectoris: Secondary | ICD-10-CM | POA: Diagnosis present

## 2019-12-16 DIAGNOSIS — Z951 Presence of aortocoronary bypass graft: Secondary | ICD-10-CM

## 2019-12-16 HISTORY — PX: TEE WITHOUT CARDIOVERSION: SHX5443

## 2019-12-16 HISTORY — PX: CORONARY ARTERY BYPASS GRAFT: SHX141

## 2019-12-16 LAB — POCT I-STAT 7, (LYTES, BLD GAS, ICA,H+H)
Acid-Base Excess: 2 mmol/L (ref 0.0–2.0)
Acid-base deficit: 1 mmol/L (ref 0.0–2.0)
Acid-base deficit: 2 mmol/L (ref 0.0–2.0)
Acid-base deficit: 4 mmol/L — ABNORMAL HIGH (ref 0.0–2.0)
Acid-base deficit: 2 mmol/L (ref 0.0–2.0)
Bicarbonate: 26.5 mmol/L (ref 20.0–28.0)
Bicarbonate: 24.3 mmol/L (ref 20.0–28.0)
Bicarbonate: 23.4 mmol/L (ref 20.0–28.0)
Bicarbonate: 22.7 mmol/L (ref 20.0–28.0)
Bicarbonate: 24.3 mmol/L (ref 20.0–28.0)
Calcium, Ion: 1.12 mmol/L — ABNORMAL LOW (ref 1.15–1.40)
Calcium, Ion: 1.31 mmol/L (ref 1.15–1.40)
Calcium, Ion: 1.3 mmol/L (ref 1.15–1.40)
Calcium, Ion: 1.29 mmol/L (ref 1.15–1.40)
Calcium, Ion: 1.32 mmol/L (ref 1.15–1.40)
HCT: 26 % — ABNORMAL LOW (ref 39.0–52.0)
HCT: 26 % — ABNORMAL LOW (ref 39.0–52.0)
HCT: 30 % — ABNORMAL LOW (ref 39.0–52.0)
HCT: 30 % — ABNORMAL LOW (ref 39.0–52.0)
HCT: 29 % — ABNORMAL LOW (ref 39.0–52.0)
Hemoglobin: 8.8 g/dL — ABNORMAL LOW (ref 13.0–17.0)
Hemoglobin: 8.8 g/dL — ABNORMAL LOW (ref 13.0–17.0)
Hemoglobin: 10.2 g/dL — ABNORMAL LOW (ref 13.0–17.0)
Hemoglobin: 10.2 g/dL — ABNORMAL LOW (ref 13.0–17.0)
Hemoglobin: 9.9 g/dL — ABNORMAL LOW (ref 13.0–17.0)
O2 Saturation: 100 %
O2 Saturation: 100 %
O2 Saturation: 99 %
O2 Saturation: 97 %
O2 Saturation: 98 %
Patient temperature: 36
Patient temperature: 38.5
Patient temperature: 37.8
Patient temperature: 37.5
Potassium: 3.8 mmol/L (ref 3.5–5.1)
Potassium: 3.6 mmol/L (ref 3.5–5.1)
Potassium: 4.8 mmol/L (ref 3.5–5.1)
Potassium: 4.4 mmol/L (ref 3.5–5.1)
Potassium: 4.6 mmol/L (ref 3.5–5.1)
Sodium: 139 mmol/L (ref 135–145)
Sodium: 141 mmol/L (ref 135–145)
Sodium: 142 mmol/L (ref 135–145)
Sodium: 143 mmol/L (ref 135–145)
Sodium: 142 mmol/L (ref 135–145)
TCO2: 28 mmol/L (ref 22–32)
TCO2: 26 mmol/L (ref 22–32)
TCO2: 25 mmol/L (ref 22–32)
TCO2: 24 mmol/L (ref 22–32)
TCO2: 26 mmol/L (ref 22–32)
pCO2 arterial: 39.7 mmHg (ref 32.0–48.0)
pCO2 arterial: 39.9 mmHg (ref 32.0–48.0)
pCO2 arterial: 45.3 mmHg (ref 32.0–48.0)
pCO2 arterial: 47.3 mmHg (ref 32.0–48.0)
pCO2 arterial: 48.1 mmHg — ABNORMAL HIGH (ref 32.0–48.0)
pH, Arterial: 7.433 (ref 7.350–7.450)
pH, Arterial: 7.389 (ref 7.350–7.450)
pH, Arterial: 7.328 — ABNORMAL LOW (ref 7.350–7.450)
pH, Arterial: 7.293 — ABNORMAL LOW (ref 7.350–7.450)
pH, Arterial: 7.314 — ABNORMAL LOW (ref 7.350–7.450)
pO2, Arterial: 361 mmHg — ABNORMAL HIGH (ref 83.0–108.0)
pO2, Arterial: 307 mmHg — ABNORMAL HIGH (ref 83.0–108.0)
pO2, Arterial: 131 mmHg — ABNORMAL HIGH (ref 83.0–108.0)
pO2, Arterial: 106 mmHg (ref 83.0–108.0)
pO2, Arterial: 112 mmHg — ABNORMAL HIGH (ref 83.0–108.0)

## 2019-12-16 LAB — POCT I-STAT, CHEM 8
BUN: 8 mg/dL (ref 8–23)
BUN: 8 mg/dL (ref 8–23)
BUN: 8 mg/dL (ref 8–23)
BUN: 7 mg/dL — ABNORMAL LOW (ref 8–23)
Calcium, Ion: 1.3 mmol/L (ref 1.15–1.40)
Calcium, Ion: 1.35 mmol/L (ref 1.15–1.40)
Calcium, Ion: 1.17 mmol/L (ref 1.15–1.40)
Calcium, Ion: 1.42 mmol/L — ABNORMAL HIGH (ref 1.15–1.40)
Chloride: 102 mmol/L (ref 98–111)
Chloride: 104 mmol/L (ref 98–111)
Chloride: 102 mmol/L (ref 98–111)
Chloride: 103 mmol/L (ref 98–111)
Creatinine, Ser: 0.5 mg/dL — ABNORMAL LOW (ref 0.61–1.24)
Creatinine, Ser: 0.6 mg/dL — ABNORMAL LOW (ref 0.61–1.24)
Creatinine, Ser: 0.6 mg/dL — ABNORMAL LOW (ref 0.61–1.24)
Creatinine, Ser: 0.5 mg/dL — ABNORMAL LOW (ref 0.61–1.24)
Glucose, Bld: 89 mg/dL (ref 70–99)
Glucose, Bld: 96 mg/dL (ref 70–99)
Glucose, Bld: 110 mg/dL — ABNORMAL HIGH (ref 70–99)
Glucose, Bld: 99 mg/dL (ref 70–99)
HCT: 31 % — ABNORMAL LOW (ref 39.0–52.0)
HCT: 32 % — ABNORMAL LOW (ref 39.0–52.0)
HCT: 26 % — ABNORMAL LOW (ref 39.0–52.0)
HCT: 27 % — ABNORMAL LOW (ref 39.0–52.0)
Hemoglobin: 10.5 g/dL — ABNORMAL LOW (ref 13.0–17.0)
Hemoglobin: 10.9 g/dL — ABNORMAL LOW (ref 13.0–17.0)
Hemoglobin: 8.8 g/dL — ABNORMAL LOW (ref 13.0–17.0)
Hemoglobin: 9.2 g/dL — ABNORMAL LOW (ref 13.0–17.0)
Potassium: 3.6 mmol/L (ref 3.5–5.1)
Potassium: 3.8 mmol/L (ref 3.5–5.1)
Potassium: 4.6 mmol/L (ref 3.5–5.1)
Potassium: 4.1 mmol/L (ref 3.5–5.1)
Sodium: 139 mmol/L (ref 135–145)
Sodium: 139 mmol/L (ref 135–145)
Sodium: 136 mmol/L (ref 135–145)
Sodium: 137 mmol/L (ref 135–145)
TCO2: 29 mmol/L (ref 22–32)
TCO2: 30 mmol/L (ref 22–32)
TCO2: 27 mmol/L (ref 22–32)
TCO2: 29 mmol/L (ref 22–32)

## 2019-12-16 LAB — BASIC METABOLIC PANEL
Anion gap: 9 (ref 5–15)
Anion gap: 7 (ref 5–15)
BUN: 9 mg/dL (ref 8–23)
BUN: 8 mg/dL (ref 8–23)
CO2: 26 mmol/L (ref 22–32)
CO2: 23 mmol/L (ref 22–32)
Calcium: 9.4 mg/dL (ref 8.9–10.3)
Calcium: 8.9 mg/dL (ref 8.9–10.3)
Chloride: 101 mmol/L (ref 98–111)
Chloride: 111 mmol/L (ref 98–111)
Creatinine, Ser: 0.91 mg/dL (ref 0.61–1.24)
Creatinine, Ser: 0.89 mg/dL (ref 0.61–1.24)
GFR calc Af Amer: >60 mL/min (ref >60)
GFR calc Af Amer: >60 mL/min (ref >60)
GFR calc non Af Amer: >60 mL/min (ref >60)
GFR calc non Af Amer: >60 mL/min (ref >60)
Glucose, Bld: 109 mg/dL — ABNORMAL HIGH (ref 70–99)
Glucose, Bld: 119 mg/dL — ABNORMAL HIGH (ref 70–99)
Potassium: 3.3 mmol/L — ABNORMAL LOW (ref 3.5–5.1)
Potassium: 4.7 mmol/L (ref 3.5–5.1)
Sodium: 136 mmol/L (ref 135–145)
Sodium: 141 mmol/L (ref 135–145)

## 2019-12-16 LAB — GLUCOSE, CAPILLARY
Glucose-Capillary: 109 mg/dL — ABNORMAL HIGH (ref 70–99)
Glucose-Capillary: 116 mg/dL — ABNORMAL HIGH (ref 70–99)
Glucose-Capillary: 117 mg/dL — ABNORMAL HIGH (ref 70–99)
Glucose-Capillary: 88 mg/dL (ref 70–99)
Glucose-Capillary: 90 mg/dL (ref 70–99)

## 2019-12-16 LAB — CBC
HCT: 34.5 % — ABNORMAL LOW (ref 39.0–52.0)
HCT: 29.2 % — ABNORMAL LOW (ref 39.0–52.0)
HCT: 30.7 % — ABNORMAL LOW (ref 39.0–52.0)
Hemoglobin: 11.5 g/dL — ABNORMAL LOW (ref 13.0–17.0)
Hemoglobin: 9.6 g/dL — ABNORMAL LOW (ref 13.0–17.0)
Hemoglobin: 10.1 g/dL — ABNORMAL LOW (ref 13.0–17.0)
MCH: 31.6 pg (ref 26.0–34.0)
MCH: 31.4 pg (ref 26.0–34.0)
MCH: 32 pg (ref 26.0–34.0)
MCHC: 33.3 g/dL (ref 30.0–36.0)
MCHC: 32.9 g/dL (ref 30.0–36.0)
MCHC: 32.9 g/dL (ref 30.0–36.0)
MCV: 94.8 fL (ref 80.0–100.0)
MCV: 95.4 fL (ref 80.0–100.0)
MCV: 97.2 fL (ref 80.0–100.0)
Platelets: 165 10*3/uL (ref 150–400)
Platelets: 95 10*3/uL — ABNORMAL LOW (ref 150–400)
Platelets: 128 10*3/uL — ABNORMAL LOW (ref 150–400)
RBC: 3.64 MIL/uL — ABNORMAL LOW (ref 4.22–5.81)
RBC: 3.06 MIL/uL — ABNORMAL LOW (ref 4.22–5.81)
RBC: 3.16 MIL/uL — ABNORMAL LOW (ref 4.22–5.81)
RDW: 13.2 % (ref 11.5–15.5)
RDW: 13.2 % (ref 11.5–15.5)
RDW: 13.5 % (ref 11.5–15.5)
WBC: 4.2 10*3/uL (ref 4.0–10.5)
WBC: 4.2 10*3/uL (ref 4.0–10.5)
WBC: 6.8 10*3/uL (ref 4.0–10.5)
nRBC: 0 % (ref 0.0–0.2)
nRBC: 0 % (ref 0.0–0.2)
nRBC: 0 % (ref 0.0–0.2)

## 2019-12-16 LAB — MAGNESIUM: Magnesium: 2.7 mg/dL — ABNORMAL HIGH (ref 1.7–2.4)

## 2019-12-16 LAB — PLATELET COUNT: Platelets: 106 10*3/uL — ABNORMAL LOW (ref 150–400)

## 2019-12-16 LAB — ECHO INTRAOPERATIVE TEE
Height: 71 in
Weight: 2238.4 oz

## 2019-12-16 LAB — PROTIME-INR
INR: 1.3 — ABNORMAL HIGH (ref 0.8–1.2)
Prothrombin Time: 15.8 seconds — ABNORMAL HIGH (ref 11.4–15.2)

## 2019-12-16 LAB — HEPARIN LEVEL (UNFRACTIONATED): Heparin Unfractionated: 0.48 [IU]/mL (ref 0.30–0.70)

## 2019-12-16 LAB — APTT: aPTT: 35 seconds (ref 24–36)

## 2019-12-16 SURGERY — CORONARY ARTERY BYPASS GRAFTING (CABG)
Anesthesia: General | Site: Chest

## 2019-12-16 MED ORDER — PHENYLEPHRINE 40 MCG/ML (10ML) SYRINGE FOR IV PUSH (FOR BLOOD PRESSURE SUPPORT)
PREFILLED_SYRINGE | INTRAVENOUS | Status: AC
  Filled 2019-12-16: qty 40

## 2019-12-16 MED ORDER — SODIUM CHLORIDE 0.9 % IV SOLN: 250.0000 mL | INTRAVENOUS | Status: DC

## 2019-12-16 MED ORDER — SODIUM CHLORIDE (PF) 0.9 % IJ SOLN
INTRAMUSCULAR | Status: AC
  Filled 2019-12-16: qty 40

## 2019-12-16 MED ORDER — 0.9 % SODIUM CHLORIDE (POUR BTL) OPTIME
TOPICAL | Status: DC | PRN
  Administered 2019-12-16: 1000 mL

## 2019-12-16 MED ORDER — LACTATED RINGERS IV SOLN: INTRAVENOUS | Status: DC

## 2019-12-16 MED ORDER — NICARDIPINE HCL IN NACL 20-0.86 MG/200ML-% IV SOLN
0.0000 mg/h | INTRAVENOUS | Status: DC
  Filled 2019-12-16: qty 200

## 2019-12-16 MED ORDER — ONDANSETRON HCL 4 MG/2ML IJ SOLN
4.0000 mg | Freq: Four times a day (QID) | INTRAMUSCULAR | Status: DC | PRN
  Administered 2019-12-17 – 2019-12-18 (×2): 4 mg via INTRAVENOUS
  Filled 2019-12-16 (×2): qty 2

## 2019-12-16 MED ORDER — METOPROLOL TARTRATE 25 MG/10 ML ORAL SUSPENSION: 12.5000 mg | Freq: Two times a day (BID) | ORAL | Status: DC

## 2019-12-16 MED ORDER — ACETAMINOPHEN 500 MG PO TABS
1000.0000 mg | ORAL_TABLET | Freq: Four times a day (QID) | ORAL | Status: AC
  Administered 2019-12-16 – 2019-12-21 (×19): 1000 mg via ORAL
  Filled 2019-12-16 (×19): qty 2

## 2019-12-16 MED ORDER — PHENYLEPHRINE HCL-NACL 20-0.9 MG/250ML-% IV SOLN
0.0000 ug/min | INTRAVENOUS | Status: DC
  Administered 2019-12-17: 60 ug/min via INTRAVENOUS
  Filled 2019-12-16 (×3): qty 250

## 2019-12-16 MED ORDER — MIDAZOLAM HCL 2 MG/2ML IJ SOLN: 2.0000 mg | INTRAMUSCULAR | Status: DC | PRN

## 2019-12-16 MED ORDER — PANTOPRAZOLE SODIUM 40 MG PO TBEC: 40.0000 mg | DELAYED_RELEASE_TABLET | Freq: Every day | ORAL | Status: DC

## 2019-12-16 MED ORDER — EPHEDRINE 5 MG/ML INJ
INTRAVENOUS | Status: AC
  Filled 2019-12-16: qty 10

## 2019-12-16 MED ORDER — SODIUM CHLORIDE 0.9 % IV SOLN: INTRAVENOUS | Status: DC | PRN

## 2019-12-16 MED ORDER — PANTOPRAZOLE SODIUM 40 MG PO TBEC
40.0000 mg | DELAYED_RELEASE_TABLET | Freq: Two times a day (BID) | ORAL | Status: DC
  Administered 2019-12-17 – 2019-12-23 (×13): 40 mg via ORAL
  Filled 2019-12-16 (×13): qty 1

## 2019-12-16 MED ORDER — LACTATED RINGERS IV SOLN: INTRAVENOUS | Status: DC | PRN

## 2019-12-16 MED ORDER — MORPHINE SULFATE (PF) 2 MG/ML IV SOLN
1.0000 mg | INTRAVENOUS | Status: DC | PRN
  Administered 2019-12-16 – 2019-12-22 (×4): 2 mg via INTRAVENOUS
  Filled 2019-12-16: qty 2
  Filled 2019-12-16 (×3): qty 1

## 2019-12-16 MED ORDER — ALBUMIN HUMAN 5 % IV SOLN
250.0000 mL | INTRAVENOUS | Status: AC | PRN
  Administered 2019-12-16 – 2019-12-17 (×4): 12.5 g via INTRAVENOUS
  Filled 2019-12-16 (×3): qty 250

## 2019-12-16 MED ORDER — ASPIRIN EC 325 MG PO TBEC
325.0000 mg | DELAYED_RELEASE_TABLET | Freq: Every day | ORAL | Status: DC
  Administered 2019-12-17 – 2019-12-23 (×7): 325 mg via ORAL
  Filled 2019-12-16 (×7): qty 1

## 2019-12-16 MED ORDER — BISACODYL 5 MG PO TBEC
10.0000 mg | DELAYED_RELEASE_TABLET | Freq: Every day | ORAL | Status: DC
  Administered 2019-12-17 – 2019-12-21 (×3): 10 mg via ORAL
  Filled 2019-12-16 (×4): qty 2

## 2019-12-16 MED ORDER — SODIUM CHLORIDE 0.9% FLUSH: 3.0000 mL | INTRAVENOUS | Status: DC | PRN

## 2019-12-16 MED ORDER — ALBUMIN HUMAN 5 % IV SOLN: INTRAVENOUS | Status: DC | PRN

## 2019-12-16 MED ORDER — PROPOFOL 10 MG/ML IV BOLUS
INTRAVENOUS | Status: DC | PRN
  Administered 2019-12-16: 70 mg via INTRAVENOUS

## 2019-12-16 MED ORDER — PROTAMINE SULFATE 10 MG/ML IV SOLN
INTRAVENOUS | Status: AC
  Filled 2019-12-16: qty 50

## 2019-12-16 MED ORDER — PROTAMINE SULFATE 10 MG/ML IV SOLN
INTRAVENOUS | Status: AC
  Filled 2019-12-16: qty 5

## 2019-12-16 MED ORDER — ARTIFICIAL TEARS OPHTHALMIC OINT
TOPICAL_OINTMENT | OPHTHALMIC | Status: AC
  Filled 2019-12-16: qty 3.5

## 2019-12-16 MED ORDER — INSULIN REGULAR(HUMAN) IN NACL 100-0.9 UT/100ML-% IV SOLN: INTRAVENOUS | Status: DC

## 2019-12-16 MED ORDER — ACETAMINOPHEN 650 MG RE SUPP
650.0000 mg | Freq: Once | RECTAL | Status: AC
  Administered 2019-12-16: 650 mg via RECTAL

## 2019-12-16 MED ORDER — FAMOTIDINE IN NACL 20-0.9 MG/50ML-% IV SOLN: 20.0000 mg | Freq: Two times a day (BID) | INTRAVENOUS | Status: AC

## 2019-12-16 MED ORDER — FENTANYL CITRATE (PF) 250 MCG/5ML IJ SOLN
INTRAMUSCULAR | Status: AC
  Filled 2019-12-16: qty 25

## 2019-12-16 MED ORDER — SODIUM CHLORIDE 0.9% FLUSH
3.0000 mL | Freq: Two times a day (BID) | INTRAVENOUS | Status: DC
  Administered 2019-12-17 – 2019-12-21 (×7): 3 mL via INTRAVENOUS

## 2019-12-16 MED ORDER — HEPARIN SODIUM (PORCINE) 1000 UNIT/ML IJ SOLN
INTRAMUSCULAR | Status: AC
  Filled 2019-12-16: qty 2

## 2019-12-16 MED ORDER — DOCUSATE SODIUM 100 MG PO CAPS
200.0000 mg | ORAL_CAPSULE | Freq: Every day | ORAL | Status: DC
  Administered 2019-12-17 – 2019-12-22 (×6): 200 mg via ORAL
  Filled 2019-12-16 (×7): qty 2

## 2019-12-16 MED ORDER — LEVALBUTEROL HCL 0.63 MG/3ML IN NEBU
0.6300 mg | INHALATION_SOLUTION | Freq: Three times a day (TID) | RESPIRATORY_TRACT | Status: DC | PRN
  Administered 2019-12-18 – 2019-12-19 (×2): 0.63 mg via RESPIRATORY_TRACT
  Filled 2019-12-16 (×2): qty 3

## 2019-12-16 MED ORDER — TRAMADOL HCL 50 MG PO TABS
50.0000 mg | ORAL_TABLET | ORAL | Status: DC | PRN
  Administered 2019-12-16 – 2019-12-23 (×8): 50 mg via ORAL
  Filled 2019-12-16 (×8): qty 1

## 2019-12-16 MED ORDER — ASPIRIN 81 MG PO CHEW
324.0000 mg | CHEWABLE_TABLET | Freq: Every day | ORAL | Status: DC
  Filled 2019-12-16: qty 4

## 2019-12-16 MED ORDER — MIDAZOLAM HCL (PF) 10 MG/2ML IJ SOLN
INTRAMUSCULAR | Status: AC
  Filled 2019-12-16: qty 2

## 2019-12-16 MED ORDER — MAGNESIUM SULFATE 4 GM/100ML IV SOLN
4.0000 g | Freq: Once | INTRAVENOUS | Status: AC
  Administered 2019-12-16: 4 g via INTRAVENOUS
  Filled 2019-12-16: qty 100

## 2019-12-16 MED ORDER — POTASSIUM CHLORIDE 10 MEQ/50ML IV SOLN
10.0000 meq | INTRAVENOUS | Status: AC
  Administered 2019-12-16 (×3): 10 meq via INTRAVENOUS

## 2019-12-16 MED ORDER — METOPROLOL TARTRATE 5 MG/5ML IV SOLN: 2.5000 mg | INTRAVENOUS | Status: DC | PRN

## 2019-12-16 MED ORDER — ROCURONIUM BROMIDE 10 MG/ML (PF) SYRINGE
PREFILLED_SYRINGE | INTRAVENOUS | Status: AC
  Filled 2019-12-16: qty 40

## 2019-12-16 MED ORDER — MIDAZOLAM HCL 5 MG/5ML IJ SOLN
INTRAMUSCULAR | Status: DC | PRN
  Administered 2019-12-16: 3 mg via INTRAVENOUS
  Administered 2019-12-16: 1 mg via INTRAVENOUS
  Administered 2019-12-16: 3 mg via INTRAVENOUS
  Administered 2019-12-16: 2 mg via INTRAVENOUS
  Administered 2019-12-16: 1 mg via INTRAVENOUS

## 2019-12-16 MED ORDER — HYDROCORTISONE NA SUCCINATE PF 250 MG IJ SOLR
INTRAMUSCULAR | Status: AC
  Filled 2019-12-16: qty 250

## 2019-12-16 MED ORDER — ROCURONIUM BROMIDE 10 MG/ML (PF) SYRINGE
PREFILLED_SYRINGE | INTRAVENOUS | Status: DC | PRN
  Administered 2019-12-16: 60 mg via INTRAVENOUS
  Administered 2019-12-16: 40 mg via INTRAVENOUS
  Administered 2019-12-16: 20 mg via INTRAVENOUS
  Administered 2019-12-16: 30 mg via INTRAVENOUS
  Administered 2019-12-16 (×2): 50 mg via INTRAVENOUS

## 2019-12-16 MED ORDER — LEVOFLOXACIN IN D5W 750 MG/150ML IV SOLN
750.0000 mg | INTRAVENOUS | Status: AC
  Administered 2019-12-17: 750 mg via INTRAVENOUS
  Filled 2019-12-16: qty 150

## 2019-12-16 MED ORDER — DEXTROSE 50 % IV SOLN: 0.0000 mL | INTRAVENOUS | Status: DC | PRN

## 2019-12-16 MED ORDER — INSULIN ASPART 100 UNIT/ML ~~LOC~~ SOLN
0.0000 [IU] | SUBCUTANEOUS | Status: DC
  Administered 2019-12-17: 2 [IU] via SUBCUTANEOUS

## 2019-12-16 MED ORDER — PROTAMINE SULFATE 10 MG/ML IV SOLN
INTRAVENOUS | Status: DC | PRN
  Administered 2019-12-16: 170 mg via INTRAVENOUS

## 2019-12-16 MED ORDER — CHLORHEXIDINE GLUCONATE 0.12 % MT SOLN
15.0000 mL | OROMUCOSAL | Status: AC
  Administered 2019-12-16: 15 mL via OROMUCOSAL

## 2019-12-16 MED ORDER — ORAL CARE MOUTH RINSE
15.0000 mL | OROMUCOSAL | Status: DC
  Administered 2019-12-16 (×3): 15 mL via OROMUCOSAL

## 2019-12-16 MED ORDER — BISACODYL 10 MG RE SUPP
10.0000 mg | Freq: Every day | RECTAL | Status: DC
  Filled 2019-12-16: qty 1

## 2019-12-16 MED ORDER — SODIUM CHLORIDE 0.9 % IV SOLN: INTRAVENOUS | Status: DC

## 2019-12-16 MED ORDER — ACETAMINOPHEN 160 MG/5ML PO SOLN: 1000.0000 mg | Freq: Four times a day (QID) | ORAL | Status: AC

## 2019-12-16 MED ORDER — HEPARIN SODIUM (PORCINE) 1000 UNIT/ML IJ SOLN
INTRAMUSCULAR | Status: DC | PRN
  Administered 2019-12-16: 19000 [IU] via INTRAVENOUS

## 2019-12-16 MED ORDER — DOBUTAMINE IN D5W 4-5 MG/ML-% IV SOLN: 0.0000 ug/kg/min | INTRAVENOUS | Status: DC

## 2019-12-16 MED ORDER — DEXMEDETOMIDINE HCL IN NACL 400 MCG/100ML IV SOLN: 0.0000 ug/kg/h | INTRAVENOUS | Status: DC

## 2019-12-16 MED ORDER — SUCCINYLCHOLINE CHLORIDE 200 MG/10ML IV SOSY
PREFILLED_SYRINGE | INTRAVENOUS | Status: AC
  Filled 2019-12-16: qty 10

## 2019-12-16 MED ORDER — NOREPINEPHRINE 4 MG/250ML-% IV SOLN: 0.0000 ug/min | INTRAVENOUS | Status: DC

## 2019-12-16 MED ORDER — CHLORHEXIDINE GLUCONATE 0.12% ORAL RINSE (MEDLINE KIT)
15.0000 mL | Freq: Two times a day (BID) | OROMUCOSAL | Status: DC
  Administered 2019-12-16: 15 mL via OROMUCOSAL

## 2019-12-16 MED ORDER — PROPOFOL 10 MG/ML IV BOLUS
INTRAVENOUS | Status: AC
  Filled 2019-12-16: qty 40

## 2019-12-16 MED ORDER — FENTANYL CITRATE (PF) 250 MCG/5ML IJ SOLN
INTRAMUSCULAR | Status: DC | PRN
  Administered 2019-12-16: 100 ug via INTRAVENOUS
  Administered 2019-12-16: 50 ug via INTRAVENOUS
  Administered 2019-12-16: 100 ug via INTRAVENOUS
  Administered 2019-12-16: 75 ug via INTRAVENOUS
  Administered 2019-12-16: 125 ug via INTRAVENOUS
  Administered 2019-12-16: 200 ug via INTRAVENOUS
  Administered 2019-12-16 (×2): 100 ug via INTRAVENOUS
  Administered 2019-12-16 (×2): 50 ug via INTRAVENOUS
  Administered 2019-12-16 (×2): 100 ug via INTRAVENOUS
  Administered 2019-12-16: 50 ug via INTRAVENOUS

## 2019-12-16 MED ORDER — ACETAMINOPHEN 160 MG/5ML PO SOLN: 650.0000 mg | Freq: Once | ORAL | Status: AC

## 2019-12-16 MED ORDER — PLASMA-LYTE 148 IV SOLN
INTRAVENOUS | Status: DC | PRN
  Administered 2019-12-16: 500 mL via INTRAVASCULAR

## 2019-12-16 MED ORDER — CHLORHEXIDINE GLUCONATE CLOTH 2 % EX PADS
6.0000 | MEDICATED_PAD | Freq: Every day | CUTANEOUS | Status: DC
  Administered 2019-12-16 – 2019-12-22 (×4): 6 via TOPICAL

## 2019-12-16 MED ORDER — SODIUM CHLORIDE (PF) 0.9 % IJ SOLN
OROMUCOSAL | Status: DC | PRN
  Administered 2019-12-16 (×2): 3 mL via TOPICAL

## 2019-12-16 MED ORDER — VANCOMYCIN HCL IN DEXTROSE 1-5 GM/200ML-% IV SOLN
1000.0000 mg | Freq: Once | INTRAVENOUS | Status: AC
  Administered 2019-12-16: 1000 mg via INTRAVENOUS
  Filled 2019-12-16: qty 200

## 2019-12-16 MED ORDER — METOPROLOL TARTRATE 12.5 MG HALF TABLET
12.5000 mg | ORAL_TABLET | Freq: Two times a day (BID) | ORAL | Status: DC
  Administered 2019-12-17: 12.5 mg via ORAL
  Filled 2019-12-16: qty 1

## 2019-12-16 MED ORDER — LACTATED RINGERS IV SOLN: 500.0000 mL | Freq: Once | INTRAVENOUS | Status: DC | PRN

## 2019-12-16 MED ORDER — SODIUM CHLORIDE 0.45 % IV SOLN: INTRAVENOUS | Status: DC | PRN

## 2019-12-16 SURGICAL SUPPLY — 87 items
ADAPTER MULTI PERFUSION 15 (ADAPTER) ×3 IMPLANT
ADH SKN CLS APL DERMABOND .7 (GAUZE/BANDAGES/DRESSINGS) ×4
BAG DECANTER FOR FLEXI CONT (MISCELLANEOUS) ×3 IMPLANT
BASKET HEART (ORDER IN 25'S) (MISCELLANEOUS) ×1
BASKET HEART (ORDER IN 25S) (MISCELLANEOUS) ×2 IMPLANT
BLADE CLIPPER SURG (BLADE) ×3 IMPLANT
BLADE STERNUM SYSTEM 6 (BLADE) ×3 IMPLANT
BLADE SURG 11 STRL SS (BLADE) ×1 IMPLANT
BNDG CMPR MED 10X6 ELC LF (GAUZE/BANDAGES/DRESSINGS) ×2
BNDG ELASTIC 4X5.8 VLCR STR LF (GAUZE/BANDAGES/DRESSINGS) ×3 IMPLANT
BNDG ELASTIC 6X10 VLCR STRL LF (GAUZE/BANDAGES/DRESSINGS) ×1 IMPLANT
BNDG ELASTIC 6X5.8 VLCR STR LF (GAUZE/BANDAGES/DRESSINGS) ×3 IMPLANT
BNDG GAUZE ELAST 4 BULKY (GAUZE/BANDAGES/DRESSINGS) ×3 IMPLANT
CABLE SURGICAL S-101-97-12 (CABLE) ×3 IMPLANT
CANISTER SUCT 3000ML PPV (MISCELLANEOUS) ×3 IMPLANT
CANNULA AORTIC ROOT 9FR (CANNULA) ×3 IMPLANT
CANNULA EZ GLIDE 8.0 24FR (CANNULA) ×3 IMPLANT
CANNULA MC2 2 STG 29/37 NON-V (CANNULA) ×2 IMPLANT
CANNULA MC2 TWO STAGE (CANNULA) ×1
CANNULA VESSEL 3MM BLUNT TIP (CANNULA) ×3 IMPLANT
CATH ROBINSON RED A/P 18FR (CATHETERS) ×6 IMPLANT
CLIP RETRACTION 3.0MM CORONARY (MISCELLANEOUS) ×3 IMPLANT
CONNECTOR BLAKE 2:1 CARIO BLK (MISCELLANEOUS) ×3 IMPLANT
COVER MAYO STAND STRL (DRAPES) ×1 IMPLANT
DERMABOND ADVANCED (GAUZE/BANDAGES/DRESSINGS) ×2
DERMABOND ADVANCED .7 DNX12 (GAUZE/BANDAGES/DRESSINGS) IMPLANT
DRAIN CHANNEL 19F RND (DRAIN) ×6 IMPLANT
DRAIN CONNECTOR BLAKE 1:1 (MISCELLANEOUS) ×3 IMPLANT
DRAPE CARDIOVASCULAR INCISE (DRAPES) ×3
DRAPE EXTREMITY T 121X128X90 (DISPOSABLE) ×3 IMPLANT
DRAPE SLUSH/WARMER DISC (DRAPES) ×3 IMPLANT
DRAPE SRG 135X102X78XABS (DRAPES) ×2 IMPLANT
DRSG AQUACEL AG ADV 3.5X14 (GAUZE/BANDAGES/DRESSINGS) ×2 IMPLANT
DRSG COVADERM 4X14 (GAUZE/BANDAGES/DRESSINGS) ×3 IMPLANT
ELECT BLADE 4.0 EZ CLEAN MEGAD (MISCELLANEOUS) ×3
ELECT REM PT RETURN 9FT ADLT (ELECTROSURGICAL) ×6
ELECTRODE BLDE 4.0 EZ CLN MEGD (MISCELLANEOUS) IMPLANT
ELECTRODE REM PT RTRN 9FT ADLT (ELECTROSURGICAL) ×4 IMPLANT
FELT TEFLON 1X6 (MISCELLANEOUS) ×3 IMPLANT
GAUZE SPONGE 4X4 12PLY STRL (GAUZE/BANDAGES/DRESSINGS) ×6 IMPLANT
GAUZE SPONGE 4X4 12PLY STRL LF (GAUZE/BANDAGES/DRESSINGS) ×2 IMPLANT
GLOVE BIO SURGEON STRL SZ7 (GLOVE) ×6 IMPLANT
GLOVE BIOGEL M STRL SZ7.5 (GLOVE) ×6 IMPLANT
GOWN STRL REUS W/ TWL LRG LVL3 (GOWN DISPOSABLE) ×8 IMPLANT
GOWN STRL REUS W/ TWL XL LVL3 (GOWN DISPOSABLE) ×4 IMPLANT
GOWN STRL REUS W/TWL LRG LVL3 (GOWN DISPOSABLE) ×21
GOWN STRL REUS W/TWL XL LVL3 (GOWN DISPOSABLE) ×6
HEMOSTAT POWDER SURGIFOAM 1G (HEMOSTASIS) ×8 IMPLANT
KIT BASIN OR (CUSTOM PROCEDURE TRAY) ×3 IMPLANT
KIT SUCTION CATH 14FR (SUCTIONS) ×3 IMPLANT
KIT TURNOVER KIT B (KITS) ×3 IMPLANT
KIT VASOVIEW HEMOPRO 2 VH 4000 (KITS) ×3 IMPLANT
LEAD PACING MYOCARDI (MISCELLANEOUS) ×3 IMPLANT
MARKER GRAFT CORONARY BYPASS (MISCELLANEOUS) ×9 IMPLANT
NS IRRIG 1000ML POUR BTL (IV SOLUTION) ×15 IMPLANT
PACK E OPEN HEART (SUTURE) ×3 IMPLANT
PACK OPEN HEART (CUSTOM PROCEDURE TRAY) ×3 IMPLANT
PAD ARMBOARD 7.5X6 YLW CONV (MISCELLANEOUS) ×6 IMPLANT
PAD ELECT DEFIB RADIOL ZOLL (MISCELLANEOUS) ×3 IMPLANT
PENCIL BUTTON HOLSTER BLD 10FT (ELECTRODE) ×3 IMPLANT
POSITIONER HEAD DONUT 9IN (MISCELLANEOUS) ×3 IMPLANT
PUNCH AORTIC ROTATE 4.0MM (MISCELLANEOUS) ×3 IMPLANT
RELOAD ENDO GIA 45X2.5 (ENDOMECHANICALS) ×2 IMPLANT
SET CARDIOPLEGIA MPS 5001102 (MISCELLANEOUS) ×1 IMPLANT
SUT BONE WAX W31G (SUTURE) ×3 IMPLANT
SUT ETHIBOND X763 2 0 SH 1 (SUTURE) ×6 IMPLANT
SUT MNCRL AB 3-0 PS2 18 (SUTURE) ×6 IMPLANT
SUT MNCRL AB 4-0 PS2 18 (SUTURE) ×1 IMPLANT
SUT PDS AB 1 CTX 36 (SUTURE) ×6 IMPLANT
SUT PROLENE 4 0 SH DA (SUTURE) ×3 IMPLANT
SUT PROLENE 5 0 C 1 36 (SUTURE) ×11 IMPLANT
SUT PROLENE 7 0 BV 1 (SUTURE) ×1 IMPLANT
SUT PROLENE 7 0 BV1 MDA (SUTURE) ×3 IMPLANT
SUT STEEL 6MS V (SUTURE) ×6 IMPLANT
SUT VIC AB 2-0 CT1 27 (SUTURE) ×3
SUT VIC AB 2-0 CT1 TAPERPNT 27 (SUTURE) ×1 IMPLANT
SYSTEM SAHARA CHEST DRAIN ATS (WOUND CARE) ×3 IMPLANT
TAPE CLOTH SURG 4X10 WHT LF (GAUZE/BANDAGES/DRESSINGS) ×2 IMPLANT
TAPE PAPER 3X10 WHT MICROPORE (GAUZE/BANDAGES/DRESSINGS) ×3 IMPLANT
TOWEL GREEN STERILE (TOWEL DISPOSABLE) ×3 IMPLANT
TOWEL GREEN STERILE FF (TOWEL DISPOSABLE) ×3 IMPLANT
TRAY FOLEY SLVR 16FR TEMP STAT (SET/KITS/TRAYS/PACK) ×3 IMPLANT
TUBE SUCT INTRACARD DLP 20F (MISCELLANEOUS) ×3 IMPLANT
TUBE SUCTION CARDIAC 10FR (CANNULA) ×3 IMPLANT
TUBING LAP HI FLOW INSUFFLATIO (TUBING) ×3 IMPLANT
UNDERPAD 30X30 (UNDERPADS AND DIAPERS) ×3 IMPLANT
WATER STERILE IRR 1000ML POUR (IV SOLUTION) ×6 IMPLANT

## 2019-12-16 NOTE — Op Note (Signed)
[address]       ,[place] [address]             [phone]                                          12/16/2019 Patient:  Lucas [name] Pre-Op Dx:  3V CAD   NSTEMI   HTN   HLP   Right upper lobe pulmonary nodule      Post-op Dx:  same Procedure: CABG X 4.  LIMA LAD, RSVG PDA, D1, OM2   Endoscopic greater saphenous vein harvest on the right Intra-operative Transesophageal Echocardiogram  Surgeon and Role:      * Kharis Lapenna, Lucile Crater, MD - Primary  Assistant: Josie Saunders, PA-C  Anesthesia  general EBL:  548ml Blood Administration: none Xclamp Time:  55 min Pump Time:  124min  Drains: 36 F blake drain: L, mediastinal  Wires: none Counts: correct   Indications: 69 yo male with multiple medical problems is admitted due to chest pain.  He has a hx of CAD, and was lost to follow-up last year after being admitted with a Mallory-Weiss tear.  He underwent a LHC which shows severe 3V CAD.  Additionally, on cross-sectional imaging, he was noted to have a 46mm RUL pulmonary nodule concerning for primary lung cancer given his significant smoking hx.  Findings: Significant adhesion in the left pleural space.  Upon entry, bullous emphysema was not, and the patient also had a pronounced air leak.  Good conduit, calcified vessels.  Good flows on vein grafts.    Operative Technique: All invasive lines were placed in pre-op holding.  After the risks, benefits and alternatives were thoroughly discussed, the patient was brought to the operative theatre.  Anesthesia was induced, and the patient was prepped and draped in normal sterile fashion.  An appropriate surgical pause was performed, and pre-operative antibiotics were dosed accordingly.  We began with simultaneous incisions were made along the right leg for harvesting of the greater saphenous vein and the chest for the sternotomy.  In regards to the sternotomy, this was carried down with bovie cautery, and the  sternum was divided with a reciprocating saw.  Meticulous hemostasis was obtained.  The left internal thoracic artery was exposed and harvested in in pedicled fashion.  The patient was systemically heparinized, and the artery was divided distally, and placed in a papaverine sponge.    The sternal elevator was removed, and a retractor was placed.  The pericardium was divided in the midline and fashioned into a cradle with pericardial stitches.   After we confirmed an appropriate ACT, the ascending aorta was cannulated in standard fashion.  The right atrial appendage was used for venous cannulation site.  Cardiopulmonary bypass was initiated, and the heart retractor was placed. The cross clamp was applied, and a dose of anterograde cardioplegia was given with good arrest of the heart.  We moved to the posterior wall of the heart, and found a good target on the PDA.  An arteriotomy was made, and the vein graft was anastomosed to it in an end to side fashion.  Next we exposed the lateral wall, and found a good target on the OM.  An end to side anastomosis with the vein graft was then created.  Next, we exposed the anterior wall of the heart and identified a  good target on the first diagonal.   An arteriotomy was created.  The vein was anastomosed in an end to side fashion.  Finally, we exposed a good target on the LAD, and fashioned an end to side anastomosis between it and the LITA.  We began to re-warm, and a re-animation dose of cardioplegia was given.  The heart was de-aired, and the cross clamp was removed.  Meticulous hemostasis was obtained.    A partial occludding clamp was then placed on the ascending aorta, and we created an end to side anastomosis between it and the proximal vein grafts.  The proximal sites were marked with rings.  Hemostasis was obtained, and we separated from cardiopulmonary bypass without event.the heparin was reversed with protamine.  Chest tubes and wires were placed, and the sternum  was re-approximated with with sternal wires.  The soft tissue and skin were re-approximated wth absorbable suture.    The patient tolerated the procedure without any immediate complications, and was transferred to the ICU in guarded condition.  Lucas [name]

## 2019-12-16 NOTE — Anesthesia Procedure Notes (Signed)
Procedure Name: Intubation Date/Time: 12/16/2019 7:49 AM Performed by: Wilburn Cornelia, CRNA Pre-anesthesia Checklist: Patient identified, Emergency Drugs available, Suction available and Patient being monitored Patient Re-evaluated:Patient Re-evaluated prior to induction Oxygen Delivery Method: Circle system utilized Preoxygenation: Pre-oxygenation with 100% oxygen Induction Type: IV induction Ventilation: Mask ventilation without difficulty Laryngoscope Size: Mac and 4 Grade View: Grade I Tube type: Oral Tube size: 8.0 mm Number of attempts: 1 Airway Equipment and Method: Stylet and Oral airway Placement Confirmation: ETT inserted through vocal cords under direct vision,  positive ETCO2 and breath sounds checked- equal and bilateral Secured at: 23 cm Tube secured with: Tape Dental Injury: Teeth and Oropharynx as per pre-operative assessment  Comments: Inserted by Paulina Fusi, SRNA

## 2019-12-16 NOTE — Brief Op Note (Addendum)
12/12/2019 - 12/16/2019  11:02 AM  PATIENT:  Lucas [name]  69 y.o. male  PRE-OPERATIVE DIAGNOSIS:  1. S/p NSTEMI 2. Coronary artery disease  POST-OPERATIVE DIAGNOSIS:  1. S/p NSTEMI 2. Coronary artery disease  PROCEDURE:    TRANSESOPHAGEAL ECHOCARDIOGRAM (TEE), MEDIAN STERNOTOMY for CORONARY ARTERY BYPASS GRAFTING (CABG) x 4 (LIMA to LAD, SVG to DIAGONAL 1, SVG to OM2, SVG to PDA) with left internal  mammary harvest and EVH from right thigh greater saphenous vein and open harvest below the knee  SURGEON:  Surgeon(s) and Role:    Lightfoot, Lucile Crater, MD - Primary  PHYSICIAN ASSISTANT: Lars Pinks PA-C  ASSISTANTS: Dineen Kid RNFA   ANESTHESIA:   general  EBL:  Per anesthesia and perfusion record  DRAINS: Chest tubes placed in the mediastinal and pleural spaces   COUNTS CORRECT:  YES  DICTATION: .Dragon Dictation  PLAN OF CARE: Admit to inpatient   PATIENT DISPOSITION:  ICU - intubated and hemodynamically stable.   Delay start of Pharmacological VTE agent (>24hrs) due to surgical blood loss or risk of bleeding: yes  BASELINE WEIGHT: 63 kg

## 2019-12-16 NOTE — Discharge Instructions (Signed)
Discharge Instructions:  1. You may shower, please wash incisions daily with soap and water and keep dry.  If you wish to cover wounds with dressing you may do so but please keep clean and change daily.  No tub baths or swimming until incisions have completely healed.  If your incisions become red or develop any drainage please call our office at [phone]  2. No Driving until cleared by [place] office and you are no longer using narcotic pain medications  3. Monitor your weight daily.. Please use the same scale and weigh at same time... If you gain 5-10 lbs in 48 hours with associated lower extremity swelling, please contact our office at [phone]  4. Fever of 101.5 for at least 24 hours with no source, please contact our office at [phone]  5. Activity- up as tolerated, please walk at least 3 times per day.  Avoid strenuous activity, no lifting, pushing, or pulling with your arms over 8-10 lbs for a minimum of 6 weeks  6. If any questions or concerns arise, please do not hesitate to contact our office at [phone]

## 2019-12-16 NOTE — Anesthesia Procedure Notes (Signed)
Arterial Line Insertion Start/End2/22/2021 7:15 AM, 12/16/2019 7:20 AM Performed by: Effie Berkshire, MD, anesthesiologist  Patient location: Pre-op. Preanesthetic checklist: patient identified, IV checked, site marked, risks and benefits discussed, surgical consent, monitors and equipment checked, pre-op evaluation, timeout performed and anesthesia consent Lidocaine 1% used for infiltration Right, radial was placed Catheter size: 20 Fr Hand hygiene performed  and maximum sterile barriers used   Attempts: 1 Procedure performed without using ultrasound guided technique. Following insertion, dressing applied. Post procedure assessment: normal and unchanged  Patient tolerated the procedure well with no immediate complications.

## 2019-12-16 NOTE — Transfer of Care (Signed)
Immediate Anesthesia Transfer of Care Note  Patient: Lucas [name]  Procedure(s) Performed: CORONARY ARTERY BYPASS GRAFTING (CABG) TIMES 4 USING LEFT INTERNAL MAMMARY ARTERY AND ENDOSCOPICALLY HARVESTED RIGHT SAPHENOUS VEIN (N/A Chest) TRANSESOPHAGEAL ECHOCARDIOGRAM (TEE) (N/A )  Patient Location: SICU  Anesthesia Type:General  Level of Consciousness: Patient remains intubated per anesthesia plan  Airway & Oxygen Therapy: Patient remains intubated per anesthesia plan and Patient placed on Ventilator (see vital sign flow sheet for setting)  Post-op Assessment: Report given to RN and Post -op Vital signs reviewed and stable  Post vital signs: Reviewed and stable  Last Vitals:  Vitals Value Taken Time  BP 108/73 12/16/19 1242  Temp    Pulse 72 12/16/19 1245  Resp 18 12/16/19 1245  SpO2 100 % 12/16/19 1245  Vitals shown include unvalidated device data.  Last Pain:  Vitals:   12/16/19 0519  TempSrc: Axillary  PainSc:      HR 71, BP 103/64, RR 12, sats 100% on ventilator    Complications: No apparent anesthesia complications

## 2019-12-16 NOTE — Procedures (Signed)
Extubation Procedure Note  Patient Details:   Name: Lucas [name] DOB: 09-17-1951 MRN: [id number]   Airway Documentation:    Vent end date: 12/16/19 Vent end time: 1809   Evaluation  O2 sats: stable throughout Complications: No apparent complications Patient did tolerate procedure well. Bilateral Breath Sounds: Clear, Diminished   Yes  Placed on 4l/min [place] Incentive spirometer performed NIF-20 FVC-754ml  Revonda Standard 12/16/2019, 6:09 PM

## 2019-12-16 NOTE — Progress Notes (Signed)
     [address]       [place] [address]             [phone]       No events  Vitals:   12/15/19 2057 12/16/19 0519  BP: 128/81 129/81  Pulse: 92 80  Resp: 20 17  Temp: 97.9 F (36.6 C) 98.2 F (36.8 C)  SpO2: 99% 100%   Alert NAD Sinus EWOB  69 yo male with 3V CAD, and pulmonary nodule OR today for CABG.  Will look in legs first for conduit.  If none available, will harvest left radial artery, and RIMA.  Justinn Welter Bary Leriche

## 2019-12-16 NOTE — Anesthesia Preprocedure Evaluation (Addendum)
Anesthesia Evaluation  Patient identified by MRN, date of birth, ID band Patient awake    Reviewed: Allergy & Precautions, NPO status , Patient's Chart, lab work & pertinent test results  Airway Mallampati: I  TM Distance: >3 FB Neck ROM: Full    Dental  (+) Missing, Poor Dentition, Loose, Dental Advisory Given   Pulmonary COPD,  COPD inhaler, Current Smoker,    Pulmonary exam normal        Cardiovascular hypertension, Pt. on home beta blockers + CAD and + Past MI   Rhythm:Regular Rate:Normal     Neuro/Psych PSYCHIATRIC DISORDERS  Neuromuscular disease    GI/Hepatic Neg liver ROS, PUD,   Endo/Other  negative endocrine ROS  Renal/GU negative Renal ROS     Musculoskeletal negative musculoskeletal ROS (+)   Abdominal Normal abdominal exam  (+)   Peds  Hematology negative hematology ROS (+)   Anesthesia Other Findings   Reproductive/Obstetrics negative OB ROS                           Echo:  1. Left ventricular ejection fraction, by estimation, is 50 to 55%. The  left ventricle has low normal function. The left ventricle demonstrates  regional wall motion abnormalities (see scoring diagram/findings for  description). Left ventricular diastolic  parameters are indeterminate.  2. Right ventricular systolic function is mildly reduced. The right  ventricular size is normal. There is normal pulmonary artery systolic  pressure. The estimated right ventricular systolic pressure is 38.1 mmHg.  3. The mitral valve is grossly normal. Mild mitral valve regurgitation.  4. The aortic valve is tricuspid. There is moderate, nodular  calcification of the left coronary cusp as well as moderate annular  calcification. Aortic valve regurgitation is not visualized.  5. The inferior vena cava is normal in size with greater than 50%  respiratory variability, suggesting right atrial pressure of 3 mmHg.    Anesthesia Physical Anesthesia Plan  ASA: IV  Anesthesia Plan: General   Post-op Pain Management:    Induction: Intravenous  PONV Risk Score and Plan: 2 and Ondansetron  Airway Management Planned: Oral ETT  Additional Equipment: None  Intra-op Plan:   Post-operative Plan: Post-operative intubation/ventilation  Informed Consent: I have reviewed the patients History and Physical, chart, labs and discussed the procedure including the risks, benefits and alternatives for the proposed anesthesia with the patient or authorized representative who has indicated his/her understanding and acceptance.     Dental advisory given  Plan Discussed with: CRNA  Anesthesia Plan Comments:        Anesthesia Quick Evaluation

## 2019-12-16 NOTE — Anesthesia Procedure Notes (Signed)
Central Venous Catheter Insertion Performed by: Roderic Palau, MD, anesthesiologist Start/End2/22/2021 6:35 AM, 12/16/2019 6:45 AM Patient location: Pre-op. Preanesthetic checklist: patient identified, IV checked, site marked, risks and benefits discussed, surgical consent, monitors and equipment checked, pre-op evaluation, timeout performed and anesthesia consent Position: Trendelenburg Lidocaine 1% used for infiltration and patient sedated Hand hygiene performed , maximum sterile barriers used  and Seldinger technique used Catheter size: 8.5 Fr Total catheter length 10. Central line was placed.Sheath introducer Procedure performed using ultrasound guided technique. Ultrasound Notes:anatomy identified, needle tip was noted to be adjacent to the nerve/plexus identified, no ultrasound evidence of intravascular and/or intraneural injection and image(s) printed for medical record Attempts: 1 Following insertion, line sutured, dressing applied and Biopatch. Post procedure assessment: blood return through all ports, free fluid flow and no air  Patient tolerated the procedure well with no immediate complications.

## 2019-12-16 NOTE — Discharge Summary (Signed)
Physician Discharge Summary       [address]       [place] [address]             [phone]    Patient ID: Lucas [name] MRN: [id number] DOB/AGE: [birth year] 69 y.o.  Admit date: 12/12/2019 Discharge date: 12/23/2019  Admission Diagnoses: 1. Unstable angina ([place]) 2. NSTEMI (non-ST elevated myocardial infarction) ([place]) 3. Coronary artery disease  Discharge Diagnoses:  1. S/P CABG x 4 2. RUL pulmonary nodule 3. Expected ABL anemia 4. History of hypertension 5. History of hyperlipidemia 6. History of tobacco abuse 7. History of Mallory Weiss tear 8. History of bilateral AVN of femurs ([place]) 9. History of COPD 10. History of upper GI bleed 11. History of Fracture of right tibia 12. History of multiple right thoacic rib fractures 13. History of Right clavicular fracture  Procedure (s):  CABG X 4 ( LIMA LAD, RSVG PDA, D1, OM2) with endoscopic greater saphenous vein harvest on the right Intra-operative Transesophageal Echocardiogram by Dr. Kipp Brood on 12/16/2019.  History of Presenting Illness: This is a 69 yo male with multiple medical problems who was admitted due to chest pain.  He has a hx of CAD, and was lost to follow-up last year after being admitted with a Mallory-Weiss tear.  He underwent a LHC which shows severe 3V CAD.  Additionally, on cross-sectional imaging, he was noted to have a 67mm RUL pulmonary nodule concerning for primary lung cancer given his significant smoking hx.    In regards to his symptoms, he has been having exertional chest pain and shortness of breath with ambulation for over 36yr.    He has had multiple surgeries to both lower extremities, and the left wrist in the past as well. The problem will likely be that he does not have enough conduit for complete revascularization if his vein conduits are not adequate.  He will require at least 4 bypass, and thus will plan for billateral IMA, and open right radial harvest.    In  regards to the pulmonary nodule, combined CABG and lung resection is ill-advised.  He has severe bullous disease, and without a diagnosis, or adequate staging, surgical resection on a heparinized patient would put him at increased risk.  I will arrange for him to undergo a CT guided biopsy during this admission for diagnosis.  Given that he does not plan to stop smoking, he likely will be a better candidate for SBRT once he recovers from surgery. Potential risks, benefits, and complications of the surgery were discussed with the patient and he agreed to proceed with surgery. Pre operative carotid duplex US showed no significant internal carotid artery stenosis bilaterally. Patient underwent CABG x 4 on 12/15/2018.  Brief Hospital Course:  The patient was extubated the evening of surgery without difficulty. He remained afebrile and hemodynamically stable. He had a 3 beat burst of NSVT over night;otherwise, remained in sinus rhythm. Lucas [name], a line,  and foley were removed early in the post operative course. Chest tubes remained for a few days then were removed. Lopressor was started and titrated accordingly. He was volume over loaded and diuresed. He had ABL anemia. He did not require a post op transfusion. Last H and H was 8.3 and 24.7 on 02/25. He had mild thrombocytopenia and last platelet was up to 149,000. He was weaned off the insulin drip.  The patient's glucose remained well controlled. The patient's HGA1C pre op was 6.1. He likely has pre diabetes  and will need further surveillance of his HGA1C by his medical doctor after discharge. The patient was felt surgically stable for transfer from the ICU to PCTU for further convalescence on 02/24 . He was more tachycardic so Lopressor was increased to 37.5 mg bid on 02/25.  He was hypertensive and started on Cozaar for this.  He continues to progress with cardiac rehab. He was ambulating on 2 liters of oxygen post op. We will attempt to wean him to room air.   He has been tolerating a diet and has had a bowel movement.  The patient is felt surgically stable for discharge today.   Latest Vital Signs: Blood pressure 104/66, pulse 89, temperature 98.3 F (36.8 C), temperature source Oral, resp. rate (!) 21, height 5\' 11"  (1.803 m), weight 64.6 kg, SpO2 100 %.  Physical Exam:  Gen: no apparent distress Heart: RRR Lung: mild wheezes bilaterally Ext: no edema Incisions- C/D/I, sternum stable, click remains present with coughing  Discharge Condition: Stable and discharged to home.  Recent laboratory studies:  Lab Results  Component Value Date   WBC 6.6 12/22/2019   HGB 7.9 (L) 12/22/2019   HCT 23.6 (L) 12/22/2019   MCV 96.7 12/22/2019   PLT 322 12/22/2019   Lab Results  Component Value Date   NA 138 12/22/2019   K 3.8 12/22/2019   CL 102 12/22/2019   CO2 28 12/22/2019   CREATININE 0.82 12/22/2019   GLUCOSE 98 12/22/2019      Diagnostic Studies: DG Chest 2 View  Result Date: 12/19/2019 CLINICAL DATA:  Status post CABG. EXAM: CHEST - 2 VIEW COMPARISON:  Chest x-ray from yesterday. FINDINGS: Interval removal of the left chest tube and mediastinal drain. Normal heart size status post CABG. Improving mild pulmonary vascular congestion. Unchanged small left pleural effusion. Decreasing trace left apical pneumothorax. Chronic scarring at the right lung base again noted. No acute osseous abnormality. IMPRESSION: 1. Improving mild pulmonary vascular congestion with unchanged small left pleural effusion. 2. Decreasing trace left apical pneumothorax. Electronically Signed   By: Titus Dubin M.D.   On: 12/19/2019 09:23   CARDIAC CATHETERIZATION  Addendum Date: 12/12/2019    Dist LM to Prox LAD lesion is 80% stenosed.  Mid LM to Dist LM lesion is 50% stenosed.  Ost 2nd Diag lesion is 100% stenosed.  Ost Cx to Prox Cx lesion is 85% stenosed.  Prox RCA lesion is 60% stenosed.  Mid RCA-1 lesion is 99% stenosed.  Mid RCA-2 lesion is 50%  stenosed.  The left ventricular systolic function is normal.  LV end diastolic pressure is mildly elevated.  The left ventricular ejection fraction is 50-55% by visual estimate.  Ost LM lesion is 40% stenosed.  Prox Cx to Mid Cx lesion is 95% stenosed.  Ost 1st Diag lesion is 60% stenosed.  1.  Severe heavily calcified three-vessel coronary artery disease. 2.  Normal LV systolic function and mildly elevated left ventricular end-diastolic pressure. Recommendations: The coronary arteries are not suitable for PCI due to heavy diffuse calcifications.  Recommend evaluation for CABG especially that the patient has right lung mass which will likely require resection at the same time.  The patient is also a poor candidate for dual antiplatelet therapy.  There are good distal targets for CABG. Also recommend oncology consultation to ensure he does not have metastatic disease before committing to CABG.   Result Date: 12/12/2019  Dist LM to Prox LAD lesion is 80% stenosed.  Mid LM to Dist LM lesion  is 50% stenosed.  Ost 2nd Diag lesion is 100% stenosed.  Ost Cx to Prox Cx lesion is 85% stenosed.  Prox RCA lesion is 60% stenosed.  Mid RCA-1 lesion is 99% stenosed.  Mid RCA-2 lesion is 50% stenosed.  The left ventricular systolic function is normal.  LV end diastolic pressure is mildly elevated.  The left ventricular ejection fraction is 50-55% by visual estimate.  Ost LM lesion is 40% stenosed.  Prox Cx to Mid Cx lesion is 95% stenosed.  Ost 1st Diag lesion is 60% stenosed.  1.  Severe heavily calcified three-vessel coronary artery disease. 2.  Normal LV systolic function and mildly elevated left ventricular end-diastolic pressure. Recommendations: The coronary arteries are not suitable for PCI due to heavy diffuse calcifications.  Recommend evaluation for CABG especially that the patient has right lung mass which will likely require resection at the same time.  The patient is also a poor candidate for dual  antiplatelet therapy.  There are good distal targets for CABG.   DG Chest Port 1 View  Result Date: 12/18/2019 CLINICAL DATA:  Shortness of breath. EXAM: PORTABLE CHEST 1 VIEW COMPARISON:  December 17, 2019. FINDINGS: Stable cardiomegaly. Status post coronary bypass graft. No pneumothorax is noted. Old rib fractures are noted. Mild bibasilar atelectasis is noted with small pleural effusions. IMPRESSION: Mild bibasilar atelectasis with small pleural effusions. Electronically Signed   By: Marijo Conception M.D.   On: 12/18/2019 09:19   DG Chest Port 1 View  Result Date: 12/17/2019 CLINICAL DATA:  Chest soreness. Chest tubes in place. Status post CABG EXAM: PORTABLE CHEST 1 VIEW COMPARISON:  12/16/2019 FINDINGS: There is a right IJ catheter with tip at the cavoatrial junction. The endotracheal tube has been removed. Enteric tube is also been removed. Mediastinal drain and left chest tube are stable in position. Small less than 10% left apical pneumothorax identified. Increase in left pleural effusion compared with previous exam. Stable right effusion. Mild increase interstitial markings concerning for pulmonary edema. Extensive rib deformities on the right unchanged. Stable chronic right clavicle fracture. IMPRESSION: 1. Interval increase in volume of left pleural effusion. 2. Suspect mild CHF. 3. Small left apical pneumothorax. 4. Removal of endotracheal and enteric tubes. Electronically Signed   By: Kerby Moors M.D.   On: 12/17/2019 09:17   DG Chest Port 1 View  Result Date: 12/16/2019 CLINICAL DATA:  Status post CABG. EXAM: PORTABLE CHEST 1 VIEW COMPARISON:  Chest x-ray dated December 12, 2019. FINDINGS: Endotracheal tube in position with the tip 4.1 cm above the level of the carina. Enteric tube tip in the upper esophagus. Right internal jugular catheter with the tip in the proximal SVC. Mediastinal and left chest tubes are in good position. Interval CABG. Normal heart size. Normal pulmonary  vascularity. Chronic scarring at the right costophrenic angle. No pleural effusion. Trace left apical pneumothorax. No acute osseous abnormality. Multiple right-sided rib fractures again noted. Old right clavicle fracture. IMPRESSION: 1. Enteric tube tip in the upper esophagus.  Recommend advancement. 2. Other lines and tubes are appropriately positioned. 3. Interval CABG.  Trace left apical pneumothorax. Electronically Signed   By: Titus Dubin M.D.   On: 12/16/2019 13:29   DG Chest Port 1 View  Result Date: 12/12/2019 CLINICAL DATA:  Chest pain and shortness of breath. EXAM: PORTABLE CHEST 1 VIEW COMPARISON:  Radiograph 12/25/2018. CT 12/08/2017 FINDINGS: Stable hyperinflation. Chronic scarring at the right lung base with multiple remote right rib fractures. Normal heart size with unchanged  mediastinal contours. No acute airspace disease. No pulmonary edema or pneumothorax. IMPRESSION: 1. No acute chest findings. 2. Chronic scarring at the right lung base with chronic hyperinflation. Electronically Signed   By: Keith Rake M.D.   On: 12/12/2019 05:51   VAS [place] LOWER EXTREMITY SAPHENOUS VEIN MAPPING  Result Date: 12/15/2019 LOWER EXTREMITY VEIN MAPPING Indications:  Pre-op Risk Factors: Coronary artery disease.  Comparison Study: No prior study Performing Technologist: Maudry Mayhew MHA, RDMS, RVT, RDCS  Examination Guidelines: A complete evaluation includes B-mode imaging, spectral Doppler, color Doppler, and power Doppler as needed of all accessible portions of each vessel. Bilateral testing is considered an integral part of a complete examination. Limited examinations for reoccurring indications may be performed as noted. +---------------+-----------+----------------------+---------------+-----------+   RT Diameter  RT Findings         GSV            LT Diameter  LT Findings      (cm)                                            (cm)                   +---------------+-----------+----------------------+---------------+-----------+      0.38       branching     Saphenofemoral         0.47                                                   Junction                                  +---------------+-----------+----------------------+---------------+-----------+      0.33                     Proximal thigh         0.29                  +---------------+-----------+----------------------+---------------+-----------+      0.21       branching       Mid thigh            0.23       branching  +---------------+-----------+----------------------+---------------+-----------+      0.20       branching      Distal thigh          0.16                  +---------------+-----------+----------------------+---------------+-----------+      0.19                          Knee              0.15                  +---------------+-----------+----------------------+---------------+-----------+      0.21       branching       Prox calf            0.19                  +---------------+-----------+----------------------+---------------+-----------+  0.18                        Mid calf            0.18                  +---------------+-----------+----------------------+---------------+-----------+      0.25       branching      Distal calf           0.18       branching  +---------------+-----------+----------------------+---------------+-----------+      0.16                         Ankle              0.17                  +---------------+-----------+----------------------+---------------+-----------+ Diagnosing physician: Deitra Mayo MD Electronically signed by Deitra Mayo MD on 12/15/2019 at 2:13:52 PM.    Final    ECHOCARDIOGRAM COMPLETE  Result Date: 12/14/2019    ECHOCARDIOGRAM REPORT   Patient Name:   Lucas [name] Date of Exam: 12/12/2019 Medical Rec #:  [id number]    Height:       71.0 in Accession #:     [id number]   Weight:       150.0 lb Date of Birth:  1951-06-03    BSA:          1.87 m Patient Age:    62 years     BP:           134/72 mmHg Patient Gender: M            HR:           88 bpm. Exam Location:  Inpatient Procedure: 2D Echo, Cardiac Doppler and Color Doppler Indications:    Chest pain  History:        Patient has prior history of Echocardiogram examinations, most                 recent 06/26/2018. Angina and CAD, COPD; Risk                 Factors:Dyslipidemia.  Sonographer:    Johny Chess Referring Phys: 90 [place]  1. Left ventricular ejection fraction, by estimation, is 50 to 55%. The left ventricle has low normal function. The left ventricle demonstrates regional wall motion abnormalities (see scoring diagram/findings for description). Left ventricular diastolic  parameters are indeterminate.  2. Right ventricular systolic function is mildly reduced. The right ventricular size is normal. There is normal pulmonary artery systolic pressure. The estimated right ventricular systolic pressure is 50.9 mmHg.  3. The mitral valve is grossly normal. Mild mitral valve regurgitation.  4. The aortic valve is tricuspid. There is moderate, nodular calcification of the left coronary cusp as well as moderate annular calcification. Aortic valve regurgitation is not visualized.  5. The inferior vena cava is normal in size with greater than 50% respiratory variability, suggesting right atrial pressure of 3 mmHg. FINDINGS  Left Ventricle: Left ventricular ejection fraction, by estimation, is 50 to 55%. The left ventricle has low normal function. The left ventricle demonstrates regional wall motion abnormalities. The left ventricular internal cavity size was normal in size. There is borderline left ventricular hypertrophy. Left ventricular diastolic parameters are indeterminate.  LV Wall Scoring: The basal inferolateral segment and basal inferior segment are hypokinetic.  The entire  anterior wall, antero-lateral wall, mid and distal lateral wall, entire septum, entire apex, and mid and distal inferior wall are normal. Right Ventricle: The right ventricular size is normal. No increase in right ventricular wall thickness. Right ventricular systolic function is mildly reduced. There is normal pulmonary artery systolic pressure. The tricuspid regurgitant velocity is 2.54 m/s, and with an assumed right atrial pressure of 3 mmHg, the estimated right ventricular systolic pressure is 95.0 mmHg. Left Atrium: Left atrial size was normal in size. Right Atrium: Right atrial size was normal in size. Pericardium: There is no evidence of pericardial effusion. Mitral Valve: The mitral valve is grossly normal. Mild mitral annular calcification. Mild mitral valve regurgitation. Tricuspid Valve: The tricuspid valve is grossly normal. Tricuspid valve regurgitation is trivial. Aortic Valve: The aortic valve is tricuspid. Aortic valve regurgitation is not visualized. Moderate aortic valve annular calcification. Pulmonic Valve: The pulmonic valve was grossly normal. Pulmonic valve regurgitation is not visualized. Aorta: The aortic root is normal in size and structure. Venous: The inferior vena cava is normal in size with greater than 50% respiratory variability, suggesting right atrial pressure of 3 mmHg. IAS/Shunts: No atrial level shunt detected by color flow Doppler.  LEFT VENTRICLE PLAX 2D LVIDd:         4.80 cm     Diastology LVIDs:         3.80 cm     LV e' lateral:   14.70 cm/s LV PW:         1.00 cm     LV E/e' lateral: 5.3 LV IVS:        0.90 cm     LV e' medial:    5.87 cm/s LVOT diam:     1.80 cm     LV E/e' medial:  13.2 LV SV:         55.47 ml LV SV Index:   24.76 LVOT Area:     2.54 cm  LV Volumes (MOD) LV vol d, MOD A2C: 97.7 ml LV vol s, MOD A2C: 52.0 ml LV SV MOD A2C:     45.7 ml RIGHT VENTRICLE RV S prime:     12.20 cm/s TAPSE (M-mode): 1.9 cm LEFT ATRIUM             Index       RIGHT ATRIUM            Index LA diam:        3.90 cm 2.09 cm/m  RA Area:     12.50 cm LA Vol (A2C):   47.1 ml 25.24 ml/m RA Volume:   28.40 ml  15.22 ml/m LA Vol (A4C):   47.6 ml 25.51 ml/m LA Biplane Vol: 47.8 ml 25.62 ml/m  AORTIC VALVE LVOT Vmax:   103.00 cm/s LVOT Vmean:  68.900 cm/s LVOT VTI:    0.218 m  AORTA Ao Root diam: 3.00 cm MITRAL VALVE               TRICUSPID VALVE MV Area (PHT): 4.31 cm    TR Peak grad:   25.8 mmHg MV Decel Time: 176 msec    TR Vmax:        254.00 cm/s MV E velocity: 77.60 cm/s MV A velocity: 71.10 cm/s  SHUNTS MV E/A ratio:  1.09        Systemic VTI:  0.22 m  Systemic Diam: 1.80 cm Rozann Lesches MD Electronically signed by Rozann Lesches MD Signature Date/Time: 12/14/2019/12:28:35 PM    Final    ECHO INTRAOPERATIVE TEE  Result Date: 12/16/2019  *INTRAOPERATIVE TRANSESOPHAGEAL REPORT *  Patient Name:   Lucas [name]   Date of Exam: 12/16/2019 Medical Rec #:  [id number]      Height:       71.0 in Accession #:    [id number]     Weight:       139.9 lb Date of Birth:  1951/02/23      BSA:          1.81 m Patient Age:    36 years       BP:           129/81 mmHg Patient Gender: M              HR:           48 bpm. Exam Location:  Anesthesiology Transesophogeal exam was perform intraoperatively during surgical procedure. Patient was closely monitored under general anesthesia during the entirety of examination. Indications:     Coronary artery disease Sonographer:     Vickie Epley RDCS Performing Phys: Suella Broad MD Diagnosing Phys: Suella Broad MD Complications: No known complications during this procedure. POST-OP IMPRESSIONS - Left Ventricle: The left ventricle is unchanged from pre-bypass. - Right Ventricle: The right ventricle appears unchanged from pre-bypass. - Aorta: The aorta appears unchanged from pre-bypass. - Left Atrium: The left atrium appears unchanged from pre-bypass. - Left Atrial Appendage: The left atrial appendage appears unchanged from pre-bypass. -  Aortic Valve: The aortic valve appears unchanged from pre-bypass. - Mitral Valve: The mitral valve appears unchanged from pre-bypass. - Tricuspid Valve: The tricuspid valve appears unchanged from pre-bypass. - Interventricular Septum: The interventricular septum appears unchanged from pre-bypass. - Pericardium: The pericardium appears unchanged from pre-bypass. - Comments: - LVEF preserved. CO > 5 L/min - No significant RWMA. - No valvular changes. - No aortic dissection noted after cannula removal. PRE-OP FINDINGS  Left Ventricle: The left ventricle has normal systolic function, with an ejection fraction of 55-60%. The cavity size was normal. There is no increase in left ventricular wall thickness. No evidence of left ventricular regional wall motion abnormalities. Right Ventricle: The right ventricle has mildly reduced systolic function. The cavity was normal. There is no increase in right ventricular wall thickness. Right ventricular systolic pressure is normal. Left Atrium: Left atrial size was normal in size. The left atrial appendage is well visualized and there is no evidence of thrombus present. Left atrial appendage velocity is normal at greater than 40 cm/s. Right Atrium: Right atrial size was normal in size. Interatrial Septum: No atrial level shunt detected by color flow Doppler. Pericardium: There is no evidence of pericardial effusion. There is no pleural effusion. Mitral Valve: The mitral valve is normal in structure. Mild thickening of the mitral valve leaflet. No calcification of the mitral valve leaflet. Mitral valve regurgitation is mild by color flow Doppler. The MR jet is centrally-directed. There is no evidence of mitral valve vegetation. Tricuspid Valve: The tricuspid valve was normal in structure. Tricuspid valve regurgitation is trivial by color flow Doppler. The jet is directed centrally. The tricuspid valve is mildly thickened. There is no evidence of tricuspid valve vegetation. Aortic  Valve: The aortic valve is tricuspid There is mild thickening of the aortic valve and There is mild calcification of the aortic valve Aortic valve regurgitation was not visualized by  color flow Doppler. There is no evidence of aortic valve stenosis. There is no evidence of a vegetation on the aortic valve. Pulmonic Valve: The pulmonic valve was normal in structure, with normal. No evidence of pumonic stenosis. Pulmonic valve regurgitation is not visualized by color flow Doppler. Aorta: The aortic root and ascending aorta are normal in size and structure. There is evidence of protruding immobile plaque in the aortic arch; Grade II, measuring 2-53mm in size. Pulmonary Artery: The pulmonary artery is of normal size. Venous: The inferior vena cava was not well visualized.  Suella Broad MD Electronically signed by Suella Broad MD Signature Date/Time: 12/16/2019/1:10:12 PM    Final    CT Angio Chest/Abd/Pel for Dissection W and/or Wo Contrast  Result Date: 12/12/2019 CLINICAL DATA:  Chest pain with acute AA or syndrome suspected. EXAM: CT ANGIOGRAPHY CHEST, ABDOMEN AND PELVIS TECHNIQUE: Multidetector CT imaging through the chest, abdomen and pelvis was performed using the standard protocol during bolus administration of intravenous contrast. Multiplanar reconstructed images and MIPs were obtained and reviewed to evaluate the vascular anatomy. CONTRAST:  119mL OMNIPAQUE IOHEXOL 350 MG/ML SOLN COMPARISON:  CT of the chest abdomen pelvis 12/08/2017 FINDINGS: CTA CHEST FINDINGS Cardiovascular: Normal heart size. No pericardial effusion. Extensive coronary calcification. Milder aortic atherosclerotic calcification. There is no aortic aneurysm or dissection. No intramural hematoma on noncontrast phase. Good opacification of the pulmonary arteries which is negative for filling defect. Mediastinum/Nodes: No adenopathy or mass. Lungs/Pleura: Suspicious spiculated nodule in the right upper lobe measuring 11 mm, essentially new  from prior. There is posttraumatic subpleural scarring in the right lung. Emphysema and airway thickening. Musculoskeletal: Remote right rib fractures with healed deformity. No acute or aggressive finding. Review of the MIP images confirms the above findings. CTA ABDOMEN AND PELVIS FINDINGS VASCULAR Aorta: Diffuse atherosclerotic plaque of the aorta. Negative for aneurysm or dissection. Celiac: Atherosclerotic calcification. SMA: Diffuse atherosclerotic plaque along the proximal SMA. No branch occlusion, aneurysm, or dissection. Renals: Mild atherosclerotic plaque. No aneurysm or dissection. No flow limiting stenosis IMA: Atheromatous but patent Inflow: Diffuse atherosclerotic plaque without high-grade narrowing of the common or external iliac. No dissection or aneurysm. Veins: Negative in the arterial phase Review of the MIP images confirms the above findings. NON-VASCULAR Hepatobiliary: No focal liver abnormality.No evidence of biliary obstruction or stone. Pancreas: Unremarkable. Spleen: Unremarkable. Adrenals/Urinary Tract: Negative adrenals. No hydronephrosis or ureteral stone. Symmetric perinephric stranding which is nonspecific and chronic appearing. Unremarkable bladder. Stomach/Bowel:  No obstruction. No evidence of bowel inflammation Lymphatic: No mass or adenopathy. Reproductive:Negative Other: No ascites or pneumoperitoneum. Musculoskeletal: No acute abnormalities. 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 Generalized osteopenia. Remote greater trochanter proximal femur fracture on the right Review of the MIP images confirms the above findings. IMPRESSION: 1. 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 Recommend multi disciplinary thoracic oncology referral. 2. No acute finding including evidence of acute aortic syndrome. 3. Aortic Atherosclerosis (ICD10-I70.0). Extensive coronary atherosclerosis. 4.  Emphysema  (ICD10-J43.9). Electronically Signed   By: Monte Fantasia M.D.   On: 12/12/2019 07:27   VAS US DOPPLER PRE CABG  Result Date: 12/15/2019 PREOPERATIVE VASCULAR EVALUATION  Indications:      Pre-CABG. Limitations:      Mechanical interference Comparison Study: No prior study Performing Technologist: Maudry Mayhew MHA, RVT, RDCS, RDMS  Examination Guidelines: A complete evaluation includes B-mode imaging, spectral Doppler, color Doppler, and power Doppler as needed of all accessible  portions of each vessel. Bilateral testing is considered an integral part of a complete examination. Limited examinations for reoccurring indications may be performed as noted.  Right Carotid Findings: +----------+-------+-------+--------+---------------------------------+--------+           PSV    EDV    StenosisDescribe                         Comments           cm/s   cm/s                                                     +----------+-------+-------+--------+---------------------------------+--------+ CCA Prox  82     9                                                        +----------+-------+-------+--------+---------------------------------+--------+ CCA Distal69     7              smooth and heterogenous                   +----------+-------+-------+--------+---------------------------------+--------+ ICA Prox  26     7              heterogenous, irregular and                                               calcific                                  +----------+-------+-------+--------+---------------------------------+--------+ ICA Distal42     12                                                       +----------+-------+-------+--------+---------------------------------+--------+ ECA       56     5              heterogenous and smooth                   +----------+-------+-------+--------+---------------------------------+--------+ Portions of this table do not appear  on this page. +----------+--------+-------+----------------+------------+           PSV cm/sEDV cmsDescribe        Arm Pressure +----------+--------+-------+----------------+------------+ Subclavian91             Multiphasic, [id number]          +----------+--------+-------+----------------+------------+ +---------+--------+--+--------+--+---------+ VertebralPSV cm/s52EDV cm/s11Antegrade +---------+--------+--+--------+--+---------+ Left Carotid Findings: +----------+-------+-------+--------+---------------------------------+--------+           PSV    EDV    StenosisDescribe                         Comments           cm/s   cm/s                                                     +----------+-------+-------+--------+---------------------------------+--------+  CCA Prox  78     7                                                        +----------+-------+-------+--------+---------------------------------+--------+ CCA Distal70     11             smooth and heterogenous                   +----------+-------+-------+--------+---------------------------------+--------+ ICA Prox  37     9              heterogenous, irregular and                                               calcific                                  +----------+-------+-------+--------+---------------------------------+--------+ ICA Distal51     15                                                       +----------+-------+-------+--------+---------------------------------+--------+ ECA       85     6              smooth and heterogenous                   +----------+-------+-------+--------+---------------------------------+--------+ +----------+--------+--------+----------------+------------+ SubclavianPSV cm/sEDV cm/sDescribe        Arm Pressure +----------+--------+--------+----------------+------------+           125             Multiphasic, [id number]           +----------+--------+--------+----------------+------------+ +---------+--------+--+--------+--+---------+ VertebralPSV cm/s72EDV cm/s15Antegrade +---------+--------+--+--------+--+---------+  ABI Findings: +--------+------------------+-----+----------+--------+ Right   Rt Pressure (mmHg)IndexWaveform  Comment  +--------+------------------+-----+----------+--------+ [id number]                    triphasic          +--------+------------------+-----+----------+--------+ PTA     118               0.87 monophasic         +--------+------------------+-----+----------+--------+ DP      102               0.76 monophasic         +--------+------------------+-----+----------+--------+ +--------+------------------+-----+----------+-------+ Left    Lt Pressure (mmHg)IndexWaveform  Comment +--------+------------------+-----+----------+-------+ [id number]                    triphasic         +--------+------------------+-----+----------+-------+ PTA     130               0.96 monophasic        +--------+------------------+-----+----------+-------+ DP      110               0.81 monophasic        +--------+------------------+-----+----------+-------+ +-------+---------------+----------------+ ABI/TBIToday's ABI/TBIPrevious ABI/TBI +-------+---------------+----------------+ Right  0.87                            +-------+---------------+----------------+  Left   0.96                            +-------+---------------+----------------+  Right Doppler Findings: +--------+--------+-----+---------+--------+ Site    PressureIndexDoppler  Comments +--------+--------+-----+---------+--------+ [id number]          triphasic         +--------+--------+-----+---------+--------+ Radial               triphasic         +--------+--------+-----+---------+--------+ Ulnar                triphasic         +--------+--------+-----+---------+--------+  Left  Doppler Findings: +--------+--------+-----+---------+--------+ Site    PressureIndexDoppler  Comments +--------+--------+-----+---------+--------+ [id number]          triphasic         +--------+--------+-----+---------+--------+ Radial               triphasic         +--------+--------+-----+---------+--------+ Ulnar                triphasic         +--------+--------+-----+---------+--------+  Summary: Right Carotid: Velocities in the right ICA are consistent with a 1-39% stenosis. Left Carotid: Velocities in the left ICA are consistent with a 1-39% stenosis. Vertebrals:  Bilateral vertebral arteries demonstrate antegrade flow. Subclavians: Normal flow hemodynamics were seen in bilateral subclavian              arteries. Right ABI: Resting right ankle-brachial index indicates mild right lower extremity arterial disease. ABIs may be falsely elevated, given abnormal waveforms. Left ABI: Resting left ankle-brachial index is within normal range. No evidence of significant left lower extremity arterial disease. ABIs may be falsely elevated, given abnormal waveforms. Right Upper Extremity: Doppler waveforms remain within normal limits with right radial compression. Doppler waveforms decrease <50% with right ulnar compression. Left Upper Extremity: Doppler waveforms remain within normal limits with left radial compression. Doppler waveforms remain within normal limits with left ulnar compression.  Electronically signed by Deitra Mayo MD on 12/15/2019 at 2:14:26 PM.    Final        Discharge Instructions    Amb Referral to Cardiac Rehabilitation   Complete by: As directed    Diagnosis: CABG   CABG X ___: 4   After initial evaluation and assessments completed: Virtual Based Care may be provided alone or in conjunction with Phase 2 Cardiac Rehab based on patient barriers.: Yes      Discharge Medications: Allergies as of 12/23/2019      Reactions   Ace Inhibitors Swelling    Ampicillin Nausea And Vomiting   Penicillins Nausea And Vomiting   Did it involve swelling of the face/tongue/throat, SOB, or low BP? N Did it involve sudden or severe rash/hives, skin peeling, or any reaction on the inside of your mouth or nose? N Did you need to seek medical attention at a hospital or doctor's office? N When did it last happen?Teenager If all above answers are "NO", may proceed with cephalosporin use.   Codeine Nausea And Vomiting   Lisinopril Swelling   REACTION: angioedema      Medication List    STOP taking these medications   isosorbide mononitrate 30 MG 24 hr tablet Commonly known as: IMDUR   predniSONE 20 MG tablet Commonly known as: DELTASONE     TAKE these medications   albuterol 108 (90 Base) MCG/ACT inhaler Commonly known as: ProAir  HFA Inhale 2 puffs into the lungs every 6 (six) hours as needed for wheezing or shortness of breath.   aspirin 325 MG EC tablet Take 1 tablet (325 mg total) by mouth daily.   cetirizine 10 MG tablet Commonly known as: ZYRTEC TAKE 1 TABLET BY MOUTH EVERY DAY   fluticasone 50 MCG/ACT nasal spray Commonly known as: FLONASE SPRAY 2 SPRAYS INTO EACH NOSTRIL EVERY DAY What changed: See the new instructions.   guaiFENesin 600 MG 12 hr tablet Commonly known as: MUCINEX Take 1 tablet (600 mg total) by mouth 2 (two) times daily as needed.   losartan 25 MG tablet Commonly known as: COZAAR Take 0.5 tablets (12.5 mg total) by mouth daily.   metoprolol tartrate 25 MG tablet Commonly known as: LOPRESSOR Take 1.5 tablets (37.5 mg total) by mouth 2 (two) times daily. What changed: how much to take   montelukast 10 MG tablet Commonly known as: SINGULAIR TAKE 1 TABLET BY MOUTH EVERYDAY AT BEDTIME What changed:   how much to take  how to take this  when to take this  additional instructions   multivitamin-iron-minerals-folic acid chewable tablet Chew 1 tablet by mouth daily.   nitroGLYCERIN 0.4 MG SL  tablet Commonly known as: NITROSTAT Place 1 tablet (0.4 mg total) under the tongue every 5 (five) minutes as needed for chest pain.   pantoprazole 40 MG tablet Commonly known as: PROTONIX Take 1 tablet (40 mg total) by mouth 2 (two) times daily.   rosuvastatin 20 MG tablet Commonly known as: Crestor Take 1 tablet (20 mg total) by mouth daily.   sucralfate 1 GM/10ML suspension Commonly known as: CARAFATE Take 10 mLs (1 g total) by mouth 4 (four) times daily for 3 days.   traMADol 50 MG tablet Commonly known as: ULTRAM Take 1 tablet (50 mg total) by mouth every 4 (four) hours as needed for moderate pain.   umeclidinium-vilanterol 62.5-25 MCG/INH Aepb Commonly known as: ANORO ELLIPTA Inhale 1 puff into the lungs daily.   Vitamin D3 10 MCG (400 UNIT) tablet Take 2 tablets (800 Units total) by mouth daily.            Durable Medical Equipment  (From admission, onward)         Start     Ordered   12/23/19 0727  For home use only DME oxygen  Once    Question Answer Comment  Length of Need 6 Months   Mode or (Route) Nasal cannula   Liters per Minute 3   Frequency Continuous (stationary and portable oxygen unit needed)   Oxygen delivery system Gas      12/23/19 0727   12/22/19 0839  For home use only DME oxygen  Once    Question Answer Comment  Length of Need 6 Months   Mode or (Route) Nasal cannula   Liters per Minute 2   Frequency Continuous (stationary and portable oxygen unit needed)   Oxygen delivery system Gas      12/22/19 0839         The patient has been discharged on:   1.Beta Blocker:  Yes [  x ]                              No   [   ]  If No, reason:  2.Ace Inhibitor/ARB: Yes [ X  ]                                     No  [    ]                                     If No, reason:  3.Statin:   Yes [ x  ]                  No  [   ]                  If No, reason:  4.Ecasa:  Yes  [  x ]                  No   [    ]                  If No, reason:  Follow Up Appointments: Follow-up Information    Lajuana Matte, MD. Go on 12/27/2019.   Specialty: Cardiothoracic Surgery Why: Appointment time is at 11:00 am Contact information: [place] [address] [phone]        [name], DO. Call.   Specialty: Family Medicine Why: for a follow up appointment regarding further surveillance of HGA1C 6.1 (pre diabetes) Contact information: [address] [place] [address] [phone]        Almyra Deforest, [place]. Go on 01/02/2020.   Specialties: Cardiology, Radiology Why: Appointment time is at 2:15 pm Contact information: [address] [place] [address] [phone]           Signed: Ellamae Sia 12/23/2019, 7:32 AM

## 2019-12-16 NOTE — Progress Notes (Signed)
Patient ID: Lucas [name], male   DOB: Apr 08, 1951, 69 y.o.   MRN: [id number]   TCTS Evening Rounds:   Hemodynamically stable on neo 60 mcg CI = 3   Waking up. Still on vent.  Urine output good  CT output low  CBC    Component Value Date/Time   WBC 4.2 12/16/2019 1240   RBC 3.06 (L) 12/16/2019 1240   HGB 9.6 (L) 12/16/2019 1240   HGB 10.7 (L) 07/03/2018 1045   HCT 29.2 (L) 12/16/2019 1240   HCT 31.8 (L) 07/03/2018 1045   PLT 95 (L) 12/16/2019 1240   PLT 381 07/03/2018 1045   MCV 95.4 12/16/2019 1240   MCV 96 07/03/2018 1045   MCH 31.4 12/16/2019 1240   MCHC 32.9 12/16/2019 1240   RDW 13.2 12/16/2019 1240   RDW 14.2 07/03/2018 1045   LYMPHSABS 2.3 01/21/2019 0631   LYMPHSABS 1.1 07/03/2018 1045   MONOABS 0.7 01/21/2019 0631   EOSABS 0.1 01/21/2019 0631   EOSABS 0.1 07/03/2018 1045   BASOSABS 0.0 01/21/2019 0631   BASOSABS 0.0 07/03/2018 1045     BMET    Component Value Date/Time   NA 137 12/16/2019 1147   NA 138 07/03/2018 1045   K 4.1 12/16/2019 1147   CL 103 12/16/2019 1147   CO2 26 12/16/2019 0259   GLUCOSE 99 12/16/2019 1147   BUN 7 (L) 12/16/2019 1147   BUN 6 (L) 07/03/2018 1045   CREATININE 0.50 (L) 12/16/2019 1147   CREATININE 0.90 01/28/2014 1438   CALCIUM 9.4 12/16/2019 0259   GFRNONAA >60 12/16/2019 0259   GFRAA >60 12/16/2019 0259     A/P:  Stable postop course. Continue current plans

## 2019-12-16 NOTE — OR Nursing (Signed)
Family updated:  Spoke with Lucas [name] @ 09:12 and 12:27  SICU calls:  1st call 1147, second call 1207, on the way call 1231

## 2019-12-16 NOTE — Progress Notes (Signed)
  Echocardiogram Echocardiogram Transesophageal has been performed.  Michiel Cowboy 12/16/2019, 8:43 AM

## 2019-12-17 ENCOUNTER — Encounter: Payer: Self-pay | Admitting: *Deleted

## 2019-12-17 ENCOUNTER — Inpatient Hospital Stay (HOSPITAL_COMMUNITY): Payer: Medicare Other

## 2019-12-17 DIAGNOSIS — Z951 Presence of aortocoronary bypass graft: Secondary | ICD-10-CM

## 2019-12-17 LAB — HEMOGLOBIN AND HEMATOCRIT, BLOOD
HCT: 26.3 % — ABNORMAL LOW (ref 39.0–52.0)
Hemoglobin: 9.1 g/dL — ABNORMAL LOW (ref 13.0–17.0)

## 2019-12-17 LAB — GLUCOSE, CAPILLARY
Glucose-Capillary: 120 mg/dL — ABNORMAL HIGH (ref 70–99)
Glucose-Capillary: 118 mg/dL — ABNORMAL HIGH (ref 70–99)
Glucose-Capillary: 106 mg/dL — ABNORMAL HIGH (ref 70–99)
Glucose-Capillary: 99 mg/dL (ref 70–99)

## 2019-12-17 LAB — BASIC METABOLIC PANEL
Anion gap: 6 (ref 5–15)
Anion gap: 9 (ref 5–15)
BUN: 7 mg/dL — ABNORMAL LOW (ref 8–23)
BUN: 8 mg/dL (ref 8–23)
CO2: 22 mmol/L (ref 22–32)
CO2: 23 mmol/L (ref 22–32)
Calcium: 8.5 mg/dL — ABNORMAL LOW (ref 8.9–10.3)
Calcium: 9.1 mg/dL (ref 8.9–10.3)
Chloride: 110 mmol/L (ref 98–111)
Chloride: 102 mmol/L (ref 98–111)
Creatinine, Ser: 0.83 mg/dL (ref 0.61–1.24)
Creatinine, Ser: 0.99 mg/dL (ref 0.61–1.24)
GFR calc Af Amer: >60 mL/min (ref >60)
GFR calc Af Amer: >60 mL/min (ref >60)
GFR calc non Af Amer: >60 mL/min (ref >60)
GFR calc non Af Amer: >60 mL/min (ref >60)
Glucose, Bld: 126 mg/dL — ABNORMAL HIGH (ref 70–99)
Glucose, Bld: 144 mg/dL — ABNORMAL HIGH (ref 70–99)
Potassium: 4.2 mmol/L (ref 3.5–5.1)
Potassium: 4.3 mmol/L (ref 3.5–5.1)
Sodium: 138 mmol/L (ref 135–145)
Sodium: 134 mmol/L — ABNORMAL LOW (ref 135–145)

## 2019-12-17 LAB — BPAM RBC
Blood Product Expiration Date: 202103222359
Blood Product Expiration Date: 202103222359
Unit Type and Rh: 5100
Unit Type and Rh: 5100

## 2019-12-17 LAB — CBC
HCT: 26.8 % — ABNORMAL LOW (ref 39.0–52.0)
HCT: 27.4 % — ABNORMAL LOW (ref 39.0–52.0)
Hemoglobin: 8.6 g/dL — ABNORMAL LOW (ref 13.0–17.0)
Hemoglobin: 8.8 g/dL — ABNORMAL LOW (ref 13.0–17.0)
MCH: 31.6 pg (ref 26.0–34.0)
MCH: 31.7 pg (ref 26.0–34.0)
MCHC: 32.1 g/dL (ref 30.0–36.0)
MCHC: 32.1 g/dL (ref 30.0–36.0)
MCV: 98.5 fL (ref 80.0–100.0)
MCV: 98.6 fL (ref 80.0–100.0)
Platelets: 123 10*3/uL — ABNORMAL LOW (ref 150–400)
Platelets: 122 10*3/uL — ABNORMAL LOW (ref 150–400)
RBC: 2.72 MIL/uL — ABNORMAL LOW (ref 4.22–5.81)
RBC: 2.78 MIL/uL — ABNORMAL LOW (ref 4.22–5.81)
RDW: 13.6 % (ref 11.5–15.5)
RDW: 13.7 % (ref 11.5–15.5)
WBC: 8.6 10*3/uL (ref 4.0–10.5)
WBC: 10.5 10*3/uL (ref 4.0–10.5)
nRBC: 0 % (ref 0.0–0.2)
nRBC: 0 % (ref 0.0–0.2)

## 2019-12-17 LAB — TYPE AND SCREEN
ABO/RH(D): O POS
Antibody Screen: NEGATIVE
Unit division: 0
Unit division: 0

## 2019-12-17 LAB — MAGNESIUM
Magnesium: 2.4 mg/dL (ref 1.7–2.4)
Magnesium: 2.1 mg/dL (ref 1.7–2.4)

## 2019-12-17 MED ORDER — METOPROLOL TARTRATE 25 MG PO TABS
25.0000 mg | ORAL_TABLET | Freq: Two times a day (BID) | ORAL | Status: DC
  Administered 2019-12-17 – 2019-12-20 (×6): 25 mg via ORAL
  Filled 2019-12-17 (×6): qty 1

## 2019-12-17 MED ORDER — INSULIN ASPART 100 UNIT/ML ~~LOC~~ SOLN: 0.0000 [IU] | SUBCUTANEOUS | Status: DC

## 2019-12-17 MED ORDER — ORAL CARE MOUTH RINSE
15.0000 mL | Freq: Two times a day (BID) | OROMUCOSAL | Status: DC
  Administered 2019-12-17 – 2019-12-22 (×12): 15 mL via OROMUCOSAL

## 2019-12-17 MED ORDER — ENOXAPARIN SODIUM 30 MG/0.3ML ~~LOC~~ SOLN
30.0000 mg | Freq: Every day | SUBCUTANEOUS | Status: DC
  Administered 2019-12-17 – 2019-12-22 (×6): 30 mg via SUBCUTANEOUS
  Filled 2019-12-17 (×6): qty 0.3

## 2019-12-17 MED FILL — Heparin Sodium (Porcine) Inj 1000 Unit/ML: INTRAMUSCULAR | Qty: 30 | Status: AC

## 2019-12-17 MED FILL — Magnesium Sulfate Inj 50%: INTRAMUSCULAR | Qty: 10 | Status: AC

## 2019-12-17 MED FILL — Potassium Chloride Inj 2 mEq/ML: INTRAVENOUS | Qty: 40 | Status: AC

## 2019-12-17 NOTE — Anesthesia Postprocedure Evaluation (Signed)
Anesthesia Post Note  Patient: Lucas [name]  Procedure(s) Performed: CORONARY ARTERY BYPASS GRAFTING (CABG) TIMES 4 USING LEFT INTERNAL MAMMARY ARTERY AND ENDOSCOPICALLY HARVESTED RIGHT SAPHENOUS VEIN (N/A Chest) TRANSESOPHAGEAL ECHOCARDIOGRAM (TEE) (N/A )     Patient location during evaluation: PACU Anesthesia Type: General Level of consciousness: awake and alert Pain management: pain level controlled Vital Signs Assessment: post-procedure vital signs reviewed and stable Respiratory status: spontaneous breathing, nonlabored ventilation, respiratory function stable and patient connected to nasal cannula oxygen Cardiovascular status: blood pressure returned to baseline and stable Postop Assessment: no apparent nausea or vomiting Anesthetic complications: no    Last Vitals:  Vitals:   12/17/19 0700 12/17/19 0715  BP: 134/72   Pulse: 85 81  Resp: (!) 24 (!) 21  Temp:    SpO2: 100% 100%    Last Pain:  Vitals:   12/17/19 0400  TempSrc: Core  PainSc:                  Effie Berkshire

## 2019-12-17 NOTE — Progress Notes (Signed)
      [address]       [place] [address]             [phone]                 1 Day Post-Op Procedure(s) (LRB): CORONARY ARTERY BYPASS GRAFTING (CABG) TIMES 4 USING LEFT INTERNAL MAMMARY ARTERY AND ENDOSCOPICALLY HARVESTED RIGHT SAPHENOUS VEIN (N/A) TRANSESOPHAGEAL ECHOCARDIOGRAM (TEE) (N/A)   Events: No events overnight.  Extubated. _______________________________________________________________ Vitals: BP 115/83   Pulse 83   Temp 97.8 F (36.6 C) (Oral)   Resp (!) 22   Ht 5\' 11"  (1.803 m)   Wt 67 kg   SpO2 100%   BMI 20.60 kg/m   - Neuro: alert NAD  - Cardiovascular: sinus  Drips: none.   CVP:  [0 mmHg-27 mmHg] 6 mmHg CO:  [2.1 L/min] 2.1 L/min  - Pulm: EWOB, wet cough  ABG    Component Value Date/Time   PHART 7.314 (L) 12/16/2019 2233   PCO2ART 48.1 (H) 12/16/2019 2233   PO2ART 112.0 (H) 12/16/2019 2233   HCO3 24.3 12/16/2019 2233   TCO2 26 12/16/2019 2233   ACIDBASEDEF 2.0 12/16/2019 2233   O2SAT 98.0 12/16/2019 2233    - Abd: soft NT/ND - Extremity: trace edema  .Intake/Output      02/22 0701 - 02/23 0700 02/23 0701 - 02/24 0700   P.O.     I.V. (mL/kg) 3237.7 (48.3) 56.7 (0.8)   Blood 335    IV Piggyback 1181.7    Total Intake(mL/kg) 4754.4 (71) 56.7 (0.8)   Urine (mL/kg/hr) 3945 (2.5) 115 (0.5)   Blood 900    Chest Tube 760 0   Total Output 5605 115   Net -850.6 -58.3           _______________________________________________________________ Labs: CBC Latest Ref Rng & Units 12/17/2019 12/16/2019 12/16/2019  WBC 4.0 - 10.5 K/uL 8.6 - 6.8  Hemoglobin 13.0 - 17.0 g/dL 8.6(L) 9.9(L) 10.1(L)  Hematocrit 39.0 - 52.0 % 26.8(L) 29.0(L) 30.7(L)  Platelets 150 - 400 K/uL 123(L) - 128(L)   CMP Latest Ref Rng & Units 12/17/2019 12/16/2019 12/16/2019  Glucose 70 - 99 mg/dL 126(H) - 119(H)  BUN 8 - 23 mg/dL 7(L) - 8  Creatinine 0.61 - 1.24 mg/dL 0.83 - 0.89  Sodium 135 - 145 mmol/L 138 142 141  Potassium 3.5 - 5.1 mmol/L 4.2 4.6  4.7  Chloride 98 - 111 mmol/L 110 - 111  CO2 22 - 32 mmol/L 22 - 23  Calcium 8.9 - 10.3 mg/dL 8.5(L) - 8.9  Total Protein 6.5 - 8.1 g/dL - - -  Total Bilirubin 0.3 - 1.2 mg/dL - - -  Alkaline Phos 38 - 126 U/L - - -  AST 15 - 41 U/L - - -  ALT 0 - 44 U/L - - -    CXR: Pneumothorax on the left.    _______________________________________________________________  Assessment and Plan: POD 1 s/p CABG, doing well  Neuro: pain controlled CV: on a/s/bb.  Off pressors.  Will remove a line, and CVL. Pulm: aggressive pulm toilet.  Will consider biopsy of pulmonary nodule this admission Renal: will diurese today GI: advancing diet Heme: stable ID: [email or web address] Endo: SSI Dispo: floor tomorrow.  Melodie Bouillon, MD 12/17/2019 10:15 AM

## 2019-12-17 NOTE — Progress Notes (Signed)
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                                  +----------+-------+-------+--------+---------------------------------+--------+ ICA Distal42     12                                                       +----------+-------+-------+--------+---------------------------------+--------+ ECA       56     5              heterogenous and smooth                   +----------+-------+-------+--------+---------------------------------+--------+ Portions of this table do not appear on this page. +----------+--------+-------+----------------+------------+           PSV cm/sEDV cmsDescribe        Arm  Pressure +----------+--------+-------+----------------+------------+ [id number]             Multiphasic, [id number]          +----------+--------+-------+----------------+------------+ +---------+--------+--+--------+--+---------+ VertebralPSV cm/s52EDV cm/s11Antegrade +---------+--------+--+--------+--+---------+ Left Carotid Findings: +----------+-------+-------+--------+---------------------------------+--------+           PSV    EDV    StenosisDescribe                         Comments           cm/s   cm/s                                                     +----------+-------+-------+--------+---------------------------------+--------+ CCA Prox  78     7                                                        +----------+-------+-------+--------+---------------------------------+--------+ CCA Distal70     11             smooth and heterogenous                   +----------+-------+-------+--------+---------------------------------+--------+ ICA Prox  37     9              heterogenous, irregular and                                               calcific                                  +----------+-------+-------+--------+---------------------------------+--------+ ICA Distal51     15                                                       +----------+-------+-------+--------+---------------------------------+--------+ ECA  85     6              smooth and heterogenous                   +----------+-------+-------+--------+---------------------------------+--------+ +----------+--------+--------+----------------+------------+ SubclavianPSV cm/sEDV cm/sDescribe        Arm Pressure +----------+--------+--------+----------------+------------+           125             Multiphasic, [id number]          +----------+--------+--------+----------------+------------+ +---------+--------+--+--------+--+---------+ VertebralPSV cm/s72EDV  cm/s15Antegrade +---------+--------+--+--------+--+---------+  ABI Findings: +--------+------------------+-----+----------+--------+ Right   Rt Pressure (mmHg)IndexWaveform  Comment  +--------+------------------+-----+----------+--------+ [id number]                    triphasic          +--------+------------------+-----+----------+--------+ PTA     118               0.87 monophasic         +--------+------------------+-----+----------+--------+ DP      102               0.76 monophasic         +--------+------------------+-----+----------+--------+ +--------+------------------+-----+----------+-------+ Left    Lt Pressure (mmHg)IndexWaveform  Comment +--------+------------------+-----+----------+-------+ [id number]                    triphasic         +--------+------------------+-----+----------+-------+ PTA     130               0.96 monophasic        +--------+------------------+-----+----------+-------+ DP      110               0.81 monophasic        +--------+------------------+-----+----------+-------+ +-------+---------------+----------------+ ABI/TBIToday's ABI/TBIPrevious ABI/TBI +-------+---------------+----------------+ Right  0.87                            +-------+---------------+----------------+ Left   0.96                            +-------+---------------+----------------+  Right Doppler Findings: +--------+--------+-----+---------+--------+ Site    PressureIndexDoppler  Comments +--------+--------+-----+---------+--------+ [id number]          triphasic         +--------+--------+-----+---------+--------+ Radial               triphasic         +--------+--------+-----+---------+--------+ Ulnar                triphasic         +--------+--------+-----+---------+--------+  Left Doppler Findings: +--------+--------+-----+---------+--------+ Site    PressureIndexDoppler  Comments  +--------+--------+-----+---------+--------+ [id number]          triphasic         +--------+--------+-----+---------+--------+ Radial               triphasic         +--------+--------+-----+---------+--------+ Ulnar                triphasic         +--------+--------+-----+---------+--------+  Summary: Right Carotid: Velocities in the right ICA are consistent with a 1-39% stenosis. Left Carotid: Velocities in the left ICA are consistent with a 1-39% stenosis. Vertebrals:  Bilateral vertebral arteries demonstrate antegrade flow. Subclavians: Normal flow hemodynamics were seen in bilateral subclavian  arteries. Right ABI: Resting right ankle-brachial index indicates mild right lower extremity arterial disease. ABIs may be falsely elevated, given abnormal waveforms. Left ABI: Resting left ankle-brachial index is within normal range. No evidence of significant left lower extremity arterial disease. ABIs may be falsely elevated, given abnormal waveforms. Right Upper Extremity: Doppler waveforms remain within normal limits with right radial compression. Doppler waveforms decrease <50% with right ulnar compression. Left Upper Extremity: Doppler waveforms remain within normal limits with left radial compression. Doppler waveforms remain within normal limits with left ulnar compression.  Electronically signed by Deitra Mayo MD on 12/15/2019 at 2:14:26 PM.    Final     Cardiac Studies   N/A  Assessment   1. Principal Problem: 2.   Unstable angina ([place]) 3. Active Problems: 4.   NSTEMI (non-ST elevated myocardial infarction) ([place]) 5.   S/P CABG x 4 6.   Coronary artery disease 7.   Plan   1. Recovering well post-CABG, off pressors. Some short NSVT overnight. C/o pleuritic chest pain and rhonchorous cough. BP good. H/H declined today to 8.6/27 (From 10/30) - monitor per surgery. Chest tube output 760 cc yesterday. On metoprolol and crestor. No further recommendations at  this time.  Time Spent Directly with Patient:  I have spent a total of 25 minutes with the patient reviewing hospital notes, telemetry, EKGs, labs and examining the patient as well as establishing an assessment and plan that was discussed personally with the patient.  > 50% of time was spent in direct patient care.  Length of Stay:  LOS: 5 days   Pixie Casino, MD, [facility], [place] Director of the Advanced Lipid Disorders &  Cardiovascular Risk Reduction Clinic Diplomate of the American Board of Clinical Lipidology Attending Cardiologist  Direct Dial: [phone]  Fax: [phone]  Website:  [email or web address] 12/17/2019, 8:05 AM

## 2019-12-17 NOTE — Progress Notes (Signed)
      [address]       [place] [address]             [phone]      POD # 1 CABG  BP (!) 101/59   Pulse 80   Temp 97.6 F (36.4 C) (Oral)   Resp 20   Ht 5\' 11"  (1.803 m)   Wt 67 kg   SpO2 100%   BMI 20.60 kg/m   Intake/Output Summary (Last 24 hours) at 12/17/2019 1759 Last data filed at 12/17/2019 1200 Gross per 24 hour  Intake 1349.73 ml  Output 1990 ml  Net -640.27 ml    CBG well controlled PM labs pending  Remo Lipps C. Roxan Hockey, MD [facility] [phone]

## 2019-12-18 ENCOUNTER — Inpatient Hospital Stay (HOSPITAL_COMMUNITY): Payer: Medicare Other

## 2019-12-18 ENCOUNTER — Other Ambulatory Visit: Payer: Self-pay

## 2019-12-18 LAB — CBC
HCT: 25.2 % — ABNORMAL LOW (ref 39.0–52.0)
Hemoglobin: 8.4 g/dL — ABNORMAL LOW (ref 13.0–17.0)
MCH: 31.9 pg (ref 26.0–34.0)
MCHC: 33.3 g/dL (ref 30.0–36.0)
MCV: 95.8 fL (ref 80.0–100.0)
Platelets: 114 10*3/uL — ABNORMAL LOW (ref 150–400)
RBC: 2.63 MIL/uL — ABNORMAL LOW (ref 4.22–5.81)
RDW: 13.4 % (ref 11.5–15.5)
WBC: 8.9 10*3/uL (ref 4.0–10.5)
nRBC: 0 % (ref 0.0–0.2)

## 2019-12-18 LAB — BASIC METABOLIC PANEL
Anion gap: 9 (ref 5–15)
BUN: 7 mg/dL — ABNORMAL LOW (ref 8–23)
CO2: 24 mmol/L (ref 22–32)
Calcium: 9.4 mg/dL (ref 8.9–10.3)
Chloride: 100 mmol/L (ref 98–111)
Creatinine, Ser: 0.76 mg/dL (ref 0.61–1.24)
GFR calc Af Amer: >60 mL/min (ref >60)
GFR calc non Af Amer: >60 mL/min (ref >60)
Glucose, Bld: 119 mg/dL — ABNORMAL HIGH (ref 70–99)
Potassium: 3.9 mmol/L (ref 3.5–5.1)
Sodium: 133 mmol/L — ABNORMAL LOW (ref 135–145)

## 2019-12-18 LAB — GLUCOSE, CAPILLARY
Glucose-Capillary: 100 mg/dL — ABNORMAL HIGH (ref 70–99)
Glucose-Capillary: 111 mg/dL — ABNORMAL HIGH (ref 70–99)

## 2019-12-18 MED ORDER — SODIUM CHLORIDE 0.9% FLUSH
3.0000 mL | Freq: Two times a day (BID) | INTRAVENOUS | Status: DC
  Administered 2019-12-18 – 2019-12-22 (×8): 3 mL via INTRAVENOUS

## 2019-12-18 MED ORDER — SODIUM CHLORIDE 0.9 % IV SOLN: 250.0000 mL | INTRAVENOUS | Status: DC | PRN

## 2019-12-18 MED ORDER — FUROSEMIDE 10 MG/ML IJ SOLN
40.0000 mg | Freq: Once | INTRAMUSCULAR | Status: AC
  Administered 2019-12-18: 40 mg via INTRAVENOUS
  Filled 2019-12-18: qty 4

## 2019-12-18 MED ORDER — FUROSEMIDE 40 MG PO TABS
40.0000 mg | ORAL_TABLET | Freq: Every day | ORAL | Status: DC
  Administered 2019-12-18: 40 mg via ORAL
  Filled 2019-12-18: qty 1

## 2019-12-18 MED ORDER — LACTULOSE 10 GM/15ML PO SOLN
20.0000 g | Freq: Once | ORAL | Status: AC
  Administered 2019-12-18: 20 g via ORAL
  Filled 2019-12-18: qty 30

## 2019-12-18 MED ORDER — ~~LOC~~ CARDIAC SURGERY, PATIENT & FAMILY EDUCATION: Freq: Once | Status: AC

## 2019-12-18 MED ORDER — POTASSIUM CHLORIDE CRYS ER 20 MEQ PO TBCR
40.0000 meq | EXTENDED_RELEASE_TABLET | Freq: Every day | ORAL | Status: DC
  Administered 2019-12-18 – 2019-12-21 (×4): 40 meq via ORAL
  Filled 2019-12-18 (×4): qty 2

## 2019-12-18 MED ORDER — SODIUM CHLORIDE 0.9% FLUSH: 3.0000 mL | INTRAVENOUS | Status: DC | PRN
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
## 2019-12-18 NOTE — Progress Notes (Signed)
Pt received from [place]. VSS. CHG complete. Pt oriented to room and unit. Call light in reach.  Clyde Canterbury, RN

## 2019-12-18 NOTE — Plan of Care (Signed)
Pt resting comfortably in bed. Hemodynamically stable with HR trending up, at times tachycardic at rest. Few complaints of pain. Pt has gotten better about applying pulmonary toilet techniques and is subsequently able to expel more sputum. Pt stated to this RN that he drinks approx. 6 beers per night and denies drinking hard liquor. Pt was resistant to CBG checks and was educated about need for tight glycemic control--agreeable after explanation. Some complaints about constipation. Pt educated about gradual addition of stool softeners and need to ambulate in AM to support peristalsis. Pt pleasant and agreeable and is progressing very well. Will continue to monitor.  Problem: Education: Goal: Ability to demonstrate management of disease process will improve Outcome: Progressing Goal: Ability to verbalize understanding of medication therapies will improve Outcome: Progressing   Problem: Activity: Goal: Capacity to carry out activities will improve Outcome: Progressing   Problem: Cardiac: Goal: Ability to achieve and maintain adequate cardiopulmonary perfusion will improve Outcome: Progressing   Problem: Education: Goal: Knowledge of disease or condition will improve Outcome: Progressing Goal: Understanding of discharge needs will improve Outcome: Progressing   Problem: Physical Regulation: Goal: Complications related to the disease process, condition or treatment will be avoided or minimized Outcome: Progressing   Problem: Safety: Goal: Ability to remain free from injury will improve Outcome: Progressing   Problem: Education: Goal: Understanding of CV disease, CV risk reduction, and recovery process will improve Outcome: Progressing   Problem: Cardiovascular: Goal: Ability to achieve and maintain adequate cardiovascular perfusion will improve Outcome: Progressing Goal: Vascular access site(s) Level 0-1 will be maintained Outcome: Progressing

## 2019-12-18 NOTE — Progress Notes (Addendum)
TCTS DAILY ICU PROGRESS NOTE                   Lucas [name]            [place] [address]          [id number]   2 Days Post-Op Procedure(s) (LRB): CORONARY ARTERY BYPASS GRAFTING (CABG) TIMES 4 USING LEFT INTERNAL MAMMARY ARTERY AND ENDOSCOPICALLY HARVESTED RIGHT SAPHENOUS VEIN (N/A) TRANSESOPHAGEAL ECHOCARDIOGRAM (TEE) (N/A)  Total Length of Stay:  LOS: 6 days   Subjective: Patient sitting in chair. He already walked this am. He has complaints of constipation. He denies nausea, abdominal pain.  Objective: Vital signs in last 24 hours: Temp:  [97.6 F (36.4 C)-98.8 F (37.1 C)] 98.8 F (37.1 C) (02/24 0400) Pulse Rate:  [75-113] 95 (02/24 0600) Cardiac Rhythm: Normal sinus rhythm (02/24 0400) Resp:  [14-27] 22 (02/24 0600) BP: (82-135)/(59-83) 135/66 (02/24 0600) SpO2:  [94 %-100 %] 97 % (02/24 0600) Arterial Line BP: (140)/(48) 140/48 (02/23 0800) Weight:  [67.2 kg] 67.2 kg (02/24 0500)  Filed Weights   12/16/19 0522 12/17/19 0500 12/18/19 0500  Weight: 63.5 kg 67 kg 67.2 kg    Weight change: 0.223 kg   Hemodynamic parameters for last 24 hours: CVP:  [6 mmHg] 6 mmHg  Intake/Output from previous day: 02/23 0701 - 02/24 0700 In: 474.7 [P.O.:240; I.V.:96.6; IV Piggyback:138.1] Out: 1130 [Urine:870; Chest Tube:260]  Intake/Output this shift: No intake/output data recorded.  Current Meds: Scheduled Meds: . acetaminophen  1,000 mg Oral Q6H   Or  . acetaminophen (TYLENOL) oral liquid 160 mg/5 mL  1,000 mg Per Tube Q6H  . aspirin EC  325 mg Oral Daily   Or  . aspirin  324 mg Per Tube Daily  . bisacodyl  10 mg Oral Daily   Or  . bisacodyl  10 mg Rectal Daily  . Chlorhexidine Gluconate Cloth  6 each Topical Daily  . cholecalciferol  1,000 Units Oral Daily  . docusate sodium  200 mg Oral Daily  . enoxaparin (LOVENOX) injection  30 mg Subcutaneous QHS  . fluticasone  2 spray Each Nare Daily  . folic acid  1 mg Oral Daily  . [START ON 12/19/2019]  furosemide  40 mg Intravenous Once  . furosemide  40 mg Oral Daily  . insulin aspart  0-24 Units Subcutaneous Q4H  . insulin aspart  0-24 Units Subcutaneous Q4H  . loratadine  10 mg Oral Daily  . mouth rinse  15 mL Mouth Rinse BID  . metoprolol tartrate  25 mg Oral BID  . montelukast  10 mg Oral QHS  . multivitamin with minerals  1 tablet Oral Daily  . pantoprazole  40 mg Oral BID  . potassium chloride  40 mEq Oral Daily  . rosuvastatin  20 mg Oral Daily  . sodium chloride flush  3 mL Intravenous Q12H  . thiamine  100 mg Oral Daily   Or  . thiamine  100 mg Intravenous Daily  . umeclidinium-vilanterol  1 puff Inhalation Daily   Continuous Infusions: . sodium chloride    . sodium chloride    . sodium chloride    . dexmedetomidine (PRECEDEX) IV infusion Stopped (12/16/19 1540)  . insulin Stopped (12/16/19 1525)  . lactated ringers    . lactated ringers    . lactated ringers 20 mL/hr at 12/17/19 1100   PRN Meds:.sodium chloride, ALPRAZolam, dextrose, lactated ringers, levalbuterol, metoprolol tartrate, midazolam, morphine injection, ondansetron (ZOFRAN) IV, sodium chloride  flush, traMADol  General appearance: alert, cooperative and no distress Neurologic: intact Heart: RRR Lungs: Diminished bibasilar breath sounds Abdomen: Soft, disteneded, bowel sounds present, non tender Extremities: Mild right LE edema Wound: Aquacel intact;right lower extremith wound is mostly clean and dry  Lab Results: CBC: Recent Labs    12/17/19 1805 12/18/19 0236  WBC 10.5 8.9  HGB 8.8* 8.4*  HCT 27.4* 25.2*  PLT 122* 114*   BMET:  Recent Labs    12/17/19 1805 12/18/19 0236  NA 134* 133*  K 4.3 3.9  CL 102 100  CO2 23 24  GLUCOSE 144* 119*  BUN 8 7*  CREATININE 0.99 0.76  CALCIUM 9.1 9.4    CMET: Lab Results  Component Value Date   WBC 8.9 12/18/2019   HGB 8.4 (L) 12/18/2019   HCT 25.2 (L) 12/18/2019   PLT 114 (L) 12/18/2019   GLUCOSE 119 (H) 12/18/2019   CHOL 168 12/15/2019    TRIG 28 12/15/2019   HDL 75 12/15/2019   LDLDIRECT 105 (H) 05/16/2017   LDLCALC 87 12/15/2019   ALT 24 12/15/2019   AST 32 12/15/2019   NA 133 (L) 12/18/2019   K 3.9 12/18/2019   CL 100 12/18/2019   CREATININE 0.76 12/18/2019   BUN 7 (L) 12/18/2019   CO2 24 12/18/2019   TSH 1.285 11/03/2006   INR 1.3 (H) 12/16/2019   HGBA1C 6.1 (H) 12/13/2019      PT/INR:  Recent Labs    12/16/19 1240  LABPROT 15.8*  INR 1.3*   Radiology: No results found.   Assessment/Plan: S/P Procedure(s) (LRB): CORONARY ARTERY BYPASS GRAFTING (CABG) TIMES 4 USING LEFT INTERNAL MAMMARY ARTERY AND ENDOSCOPICALLY HARVESTED RIGHT SAPHENOUS VEIN (N/A) TRANSESOPHAGEAL ECHOCARDIOGRAM (TEE) (N/A) 1. CV- SR. On Lopressor 25 mg bid.  2. Pulmonary-on 2 liters of oxygen via [place]. Wean as able. Chest tubes with 260 cc last 24 hours. Chest tubes to JP bulbs. CXR this am appears to show small left pleural effusion, trace left apical pneumothorax, bibasilar atelectasis. Per Dr. Kipp Brood, will remove chest tubes. Encourage incentive spirometer. 3. Volume overload-to be given IV Lasix today 4. CBGs 99/111/100. Pre op HGA1C 6.1. He likely has pre diabetes. Will stop accu checks and SS on transfer 5. Expected ABL anemia-H and H this am 8.4 and 25.2 6. Mild thrombocytopenia-platelets this am 114,000 7. Supplement potassium 8. Lactulose for constipation 9. Will transfer to [facility]  PA-C 12/18/2019 7:30 AM   Agree with above Doing well Will remove chest tube and transfer to the floor.  Camylle Whicker Bary Leriche

## 2019-12-19 ENCOUNTER — Inpatient Hospital Stay (HOSPITAL_COMMUNITY): Payer: Medicare Other

## 2019-12-19 LAB — CBC
HCT: 24.7 % — ABNORMAL LOW (ref 39.0–52.0)
Hemoglobin: 8.3 g/dL — ABNORMAL LOW (ref 13.0–17.0)
MCH: 31.8 pg (ref 26.0–34.0)
MCHC: 33.6 g/dL (ref 30.0–36.0)
MCV: 94.6 fL (ref 80.0–100.0)
Platelets: 149 10*3/uL — ABNORMAL LOW (ref 150–400)
RBC: 2.61 MIL/uL — ABNORMAL LOW (ref 4.22–5.81)
RDW: 13 % (ref 11.5–15.5)
WBC: 8 10*3/uL (ref 4.0–10.5)
nRBC: 0 % (ref 0.0–0.2)

## 2019-12-19 LAB — BASIC METABOLIC PANEL
Anion gap: 10 (ref 5–15)
BUN: 6 mg/dL — ABNORMAL LOW (ref 8–23)
CO2: 30 mmol/L (ref 22–32)
Calcium: 9.5 mg/dL (ref 8.9–10.3)
Chloride: 94 mmol/L — ABNORMAL LOW (ref 98–111)
Creatinine, Ser: 0.8 mg/dL (ref 0.61–1.24)
GFR calc Af Amer: >60 mL/min (ref >60)
GFR calc non Af Amer: >60 mL/min (ref >60)
Glucose, Bld: 107 mg/dL — ABNORMAL HIGH (ref 70–99)
Potassium: 4 mmol/L (ref 3.5–5.1)
Sodium: 134 mmol/L — ABNORMAL LOW (ref 135–145)

## 2019-12-19 MED ORDER — GUAIFENESIN ER 600 MG PO TB12
600.0000 mg | ORAL_TABLET | Freq: Two times a day (BID) | ORAL | Status: DC
  Administered 2019-12-19 – 2019-12-23 (×9): 600 mg via ORAL
  Filled 2019-12-19 (×9): qty 1

## 2019-12-19 MED ORDER — FUROSEMIDE 10 MG/ML IJ SOLN
40.0000 mg | Freq: Once | INTRAMUSCULAR | Status: AC
  Administered 2019-12-19: 40 mg via INTRAVENOUS
  Filled 2019-12-19: qty 4

## 2019-12-19 MED ORDER — FUROSEMIDE 40 MG PO TABS
40.0000 mg | ORAL_TABLET | Freq: Every day | ORAL | Status: DC
  Administered 2019-12-20 – 2019-12-21 (×2): 40 mg via ORAL
  Filled 2019-12-19 (×2): qty 1

## 2019-12-19 NOTE — Care Management Important Message (Signed)
Important Message  Patient Details  Name: Lucas [name] MRN: [id number] Date of Birth: [birth year]   Medicare Important Message Given:  Yes     Shelda Altes 12/19/2019, 11:49 AM
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
## 2019-12-19 NOTE — Progress Notes (Signed)
CARDIAC REHAB PHASE I   PRE:  Rate/Rhythm: 94 SR  BP:  Sitting: 130/71      SaO2: 94 2L  MODE:  Ambulation: 70 ft   POST:  Rate/Rhythm: 130 ST  BP:  Sitting: 136/73    SaO2: 94 2L   Pt helped out of bathroom. Pt sat on side of bed to catch his breath. Pts sats 92 on RA, pt placed on 2L for comfort. Pt then agreeable to ambulate after about 37m. Pt ambulated 75ft in hallway and stated he needed to sit. NT helped with wheelchair. Pt sat down, sats checked, sats 94 on 2L. Pt wheeled back to room and helped back to bed. Pt given IS, encouraged continued use. Will continue to follow.  [place], RN BSN 12/19/2019 1:34 PM

## 2019-12-19 NOTE — Progress Notes (Signed)
CARDIAC REHAB PHASE I   Offered to walk with pt. Pt requesting help to BR. Pt helped to BR without, oxygen tubing not long enough to reach (sats 100 on 2L prior). Pt unsuccessful in BR, but c/o of increased SOB. Sats checked, pt 90 on RA. Pt helped back to bed. Pt assumed a tripod position, and began purse lipped breathing. Pt placed back on 2L [place], sats rose to 93. Pt states he is too SOB to walk at this time. States he SOB similar to what he experienced prior to admission. Pt helped to lie in bed. Call bell and bedside table within reach. RN notified. Will f/u later today as time allows to try ambulation again.  [phone] Rufina Falco, RN BSN 12/19/2019 10:11 AM

## 2019-12-19 NOTE — Progress Notes (Addendum)
      [address]       [place] [address]             [phone]        3 Days Post-Op Procedure(s) (LRB): CORONARY ARTERY BYPASS GRAFTING (CABG) TIMES 4 USING LEFT INTERNAL MAMMARY ARTERY AND ENDOSCOPICALLY HARVESTED RIGHT SAPHENOUS VEIN (N/A) TRANSESOPHAGEAL ECHOCARDIOGRAM (TEE) (N/A)  Subjective: Patient states he had a "hard time breathing earlier but is better now". He had a bowel movement yesterday. He is asking for suction for sputum he coughs up.  Objective: Vital signs in last 24 hours: Temp:  [98 F (36.7 C)-99.3 F (37.4 C)] 99.3 F (37.4 C) (02/25 0606) Pulse Rate:  [77-112] 100 (02/25 0606) Cardiac Rhythm: Sinus tachycardia (02/24 1900) Resp:  [14-28] 19 (02/25 0606) BP: (111-194)/(66-92) 126/72 (02/25 0606) SpO2:  [87 %-100 %] 98 % (02/25 0606) FiO2 (%):  [28 %] 28 % (02/24 0843) Weight:  [65.1 kg] 65.1 kg (02/25 0445)  Pre op weight 63.5 kg Current Weight  12/19/19 65.1 kg      Intake/Output from previous day: 02/24 0701 - 02/25 0700 In: 840 [P.O.:840] Out: 2330 [Urine:2270; Chest Tube:60]   Physical Exam:  Cardiovascular: Slightly tachycardic this am Pulmonary: Rhonchi this am Abdomen: Soft, non tender, bowel sounds present. Extremities: Trace bilateral lower extremity edema. Wounds: Aquacel removed and sternal wound is clean and dry.  RLE wounds clean and dry as well;no erythema or signs of infection.  Lab Results: CBC: Recent Labs    12/18/19 0236 12/19/19 0230  WBC 8.9 8.0  HGB 8.4* 8.3*  HCT 25.2* 24.7*  PLT 114* 149*   BMET:  Recent Labs    12/18/19 0236 12/19/19 0230  NA 133* 134*  K 3.9 4.0  CL 100 94*  CO2 24 30  GLUCOSE 119* 107*  BUN 7* 6*  CREATININE 0.76 0.80  CALCIUM 9.4 9.5    PT/INR:  Lab Results  Component Value Date   INR 1.3 (H) 12/16/2019   INR 1.1 12/13/2019   INR 1.00 06/24/2018   ABG:  INR: Will add last result for INR, ABG once components are confirmed Will add last 4 CBG  results once components are confirmed  Assessment/Plan: 1. CV- Slightly tachycardic. On Lopressor 25 mg bid but will increase to 37.5 mg bid for better HR control. Hopefully, will not go into a fib. 2. Pulmonary-on  liters of oxygen via [place]. Wean as able. CXR this am appears to show small b/l pleural effusions and bibasilar atelectasis. Mucinex for cough and suction PRN for sputum production. Encourage incentive spirometer. 3. Volume overload-will give IV lasix again today then transition to oral in am 5. Expected ABL anemia-H and H this am 8.3 and 24.7 6. Mild thrombocytopenia-platelets this am increased to 149,000 7. Deconditioned-cardiac rehab  Sharalyn Ink [facility] 12/19/2019,7:26 AM   Agree with above Deconditioned, needs continued rehab Continue pulm toilet Will obtain CT guided biopsy as an outpatient.  Josanne Boerema Bary Leriche

## 2019-12-20 DIAGNOSIS — J90 Pleural effusion, not elsewhere classified: Secondary | ICD-10-CM

## 2019-12-20 MED ORDER — LOSARTAN POTASSIUM 25 MG PO TABS
25.0000 mg | ORAL_TABLET | Freq: Every day | ORAL | Status: DC
  Administered 2019-12-20 – 2019-12-21 (×2): 25 mg via ORAL
  Filled 2019-12-20 (×2): qty 1

## 2019-12-20 MED ORDER — LEVALBUTEROL HCL 0.63 MG/3ML IN NEBU
0.6300 mg | INHALATION_SOLUTION | Freq: Three times a day (TID) | RESPIRATORY_TRACT | Status: DC
  Administered 2019-12-20 – 2019-12-22 (×5): 0.63 mg via RESPIRATORY_TRACT
  Filled 2019-12-20 (×5): qty 3

## 2019-12-20 MED ORDER — METOPROLOL TARTRATE 25 MG PO TABS
37.5000 mg | ORAL_TABLET | Freq: Two times a day (BID) | ORAL | Status: DC
  Administered 2019-12-20 – 2019-12-23 (×6): 37.5 mg via ORAL
  Filled 2019-12-20 (×6): qty 1

## 2019-12-20 MED ORDER — LEVALBUTEROL HCL 0.63 MG/3ML IN NEBU
0.6300 mg | INHALATION_SOLUTION | Freq: Three times a day (TID) | RESPIRATORY_TRACT | Status: DC
  Administered 2019-12-20: 0.63 mg via RESPIRATORY_TRACT
  Filled 2019-12-20: qty 3

## 2019-12-20 MED FILL — Heparin Sodium (Porcine) Inj 1000 Unit/ML: INTRAMUSCULAR | Qty: 20 | Status: AC

## 2019-12-20 MED FILL — Electrolyte-R (PH 7.4) Solution: INTRAVENOUS | Qty: 4000 | Status: AC

## 2019-12-20 MED FILL — Calcium Chloride Inj 10%: INTRAVENOUS | Qty: 10 | Status: AC

## 2019-12-20 MED FILL — Mannitol IV Soln 20%: INTRAVENOUS | Qty: 500 | Status: AC

## 2019-12-20 MED FILL — Sodium Bicarbonate IV Soln 8.4%: INTRAVENOUS | Qty: 50 | Status: AC

## 2019-12-20 MED FILL — Sodium Chloride IV Soln 0.9%: INTRAVENOUS | Qty: 2000 | Status: AC
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
## 2019-12-20 NOTE — Progress Notes (Signed)
CARDIAC REHAB PHASE I   PRE:  Rate/Rhythm: 105 ST    BP: sitting 105/64    SaO2: 100 2L  MODE:  Ambulation: 190 ft   POST:  Rate/Rhythm: 130 ST    BP: sitting 134/87     SaO2: 89-90 RA with sitting  Pt able to stand independently. Slightly unsteady in room, standby assist. DOE but SaO2 95 RA walking without RW. Rested after 95 ft, leaning over handrail, exhausted. Pt wanted to walk farther but had him turn around to be more aware of his fatigue. Rushed back to room, DOE. SAO2 89-90 RA sitting, up to 94 RA. Pt asked for O2 "you don't know how this breathing feels". Left in bed on 2L. Encouraged IS and another walk. [place], ACSM 12/20/2019 2:31 PM

## 2019-12-20 NOTE — Progress Notes (Addendum)
      [address]       [place] [address]             [phone]      4 Days Post-Op Procedure(s) (LRB): CORONARY ARTERY BYPASS GRAFTING (CABG) TIMES 4 USING LEFT INTERNAL MAMMARY ARTERY AND ENDOSCOPICALLY HARVESTED RIGHT SAPHENOUS VEIN (N/A) TRANSESOPHAGEAL ECHOCARDIOGRAM (TEE) (N/A)   Subjective:  Patient continues to have some shortness of breath.  He is now complaining of a "clicking" in his chest especially when he coughs.  He is ambulating with assistance.  Objective: Vital signs in last 24 hours: Temp:  [98.2 F (36.8 C)-99.6 F (37.6 C)] 98.2 F (36.8 C) (02/26 0813) Pulse Rate:  [88-127] 127 (02/26 0813) Cardiac Rhythm: Sinus tachycardia (02/25 1900) Resp:  [18-22] 22 (02/26 0813) BP: (99-149)/(63-78) 149/78 (02/26 0813) SpO2:  [96 %-100 %] 97 % (02/26 0813) Weight:  [62.8 kg] 62.8 kg (02/26 0500)  Intake/Output from previous day: 02/25 0701 - 02/26 0700 In: 360 [P.O.:360] Out: 925 [Urine:925]  General appearance: alert, cooperative and no distress Heart: regular rate and rhythm and tachy, + sternal clicking, sternum does appear to be moving Lungs: rhonchi bilaterally, improved after coughing  Abdomen: soft, non-tender; bowel sounds normal; no masses,  no organomegaly Extremities: edema trace Wound: clean and dry  Lab Results: Recent Labs    12/18/19 0236 12/19/19 0230  WBC 8.9 8.0  HGB 8.4* 8.3*  HCT 25.2* 24.7*  PLT 114* 149*   BMET:  Recent Labs    12/18/19 0236 12/19/19 0230  NA 133* 134*  K 3.9 4.0  CL 100 94*  CO2 24 30  GLUCOSE 119* 107*  BUN 7* 6*  CREATININE 0.76 0.80  CALCIUM 9.4 9.5    PT/INR: No results for input(s): LABPROT, INR in the last 72 hours. ABG    Component Value Date/Time   PHART 7.314 (L) 12/16/2019 2233   HCO3 24.3 12/16/2019 2233   TCO2 26 12/16/2019 2233   ACIDBASEDEF 2.0 12/16/2019 2233   O2SAT 98.0 12/16/2019 2233   CBG (last 3)  Recent Labs    12/17/19 2033 12/18/19 0003  12/18/19 0403  GLUCAP 99 111* 100*    Assessment/Plan: S/P Procedure(s) (LRB): CORONARY ARTERY BYPASS GRAFTING (CABG) TIMES 4 USING LEFT INTERNAL MAMMARY ARTERY AND ENDOSCOPICALLY HARVESTED RIGHT SAPHENOUS VEIN (N/A) TRANSESOPHAGEAL ECHOCARDIOGRAM (TEE) (N/A)  1. CV- Sinus Tachycardia, BP elevated- will increase Lopressor to 37. 5 mg BID, will start low dose Cozaar at 25 mg daily 2. Pulm- difficulty expectorating sputum, will schedule nebs, add Mucinex, sternal clicking, may be related to broken sternal wire, they were intact on yesterday's CXR.Marland Kitchen will discuss possible repeat CXR with Dr. Kipp Brood 3. Renal-creatinine stable, weight is trending down, continue Lasix, potassium 4. Expected post operative blood loss anemia, stable 5. Deconditioning- per nursing patient could barely get up and go to the bathroom, will get PT consult, however patient states wife will help take care of him at home, but may benefit from short rehab stay 6. Dispo- patient stable, titrate BB, add Cozaar for better BP control, aggressive pulm toilet, will discuss sternal clicking with Dr. Kipp Brood    LOS: 8 days    Ellwood Handler, PA-C  12/20/2019   Agree with above Doing well Needs rehab.  Home health PT recommended dispo planning.  Lucas [name]
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
## 2019-12-21 MED ORDER — LOSARTAN POTASSIUM 25 MG PO TABS
12.5000 mg | ORAL_TABLET | Freq: Every day | ORAL | Status: DC
  Administered 2019-12-22 – 2019-12-23 (×2): 12.5 mg via ORAL
  Filled 2019-12-21 (×2): qty 1

## 2019-12-21 NOTE — Progress Notes (Addendum)
CARDIAC REHAB PHASE I   PRE:  Rate/Rhythm: 107 ST   Arrived at room, pt in bathroom, waited for pt to finish. Pt very agitated once returning to bed. Pt upset about O2 placement and tv. Pt pushed call bell for assistance. Pt still upset after being helped, refused to walk. Asked pt about IS use, that seemed to agitate pt more. Asked pt was this a good time for education, pt stated we could start reviewing education. Reviewed IS use, smoking cessation, restrictions, heart healthy diet, cardiac rehab, and exercise guidelines. Reviewed sternal precuations with pt. Demonstrated how to correctly stand up from chair. Stressed the importance of walking. Strongly encouraged pt to ambulate later on and continued [place] of IS. Pt seemed less agitated towards end of education. Pt verbalized understanding. Will send referral to [facility]. Call bell within reach.   [phone]   Carma Lair MS, ACSM CEP  2:11 PM 12/21/2019

## 2019-12-21 NOTE — Plan of Care (Signed)
  Problem: Education: Goal: Ability to demonstrate management of disease process will improve Outcome: Progressing Goal: Ability to verbalize understanding of medication therapies will improve Outcome: Progressing Goal: Individualized Educational Video(s) Outcome: Progressing   Problem: Activity: Goal: Capacity to carry out activities will improve Outcome: Progressing   Problem: Cardiac: Goal: Ability to achieve and maintain adequate cardiopulmonary perfusion will improve Outcome: Progressing   Problem: Education: Goal: Knowledge of disease or condition will improve Outcome: Progressing Goal: Understanding of discharge needs will improve Outcome: Progressing   Problem: Health Behavior/Discharge Planning: Goal: Ability to identify changes in lifestyle to reduce recurrence of condition will improve Outcome: Progressing Goal: Identification of resources available to assist in meeting health care needs will improve Outcome: Progressing   Problem: Physical Regulation: Goal: Complications related to the disease process, condition or treatment will be avoided or minimized Outcome: Progressing   Problem: Safety: Goal: Ability to remain free from injury will improve Outcome: Progressing   Problem: Education: Goal: Understanding of CV disease, CV risk reduction, and recovery process will improve Outcome: Progressing Goal: Individualized Educational Video(s) Outcome: Progressing   Problem: Activity: Goal: Ability to return to baseline activity level will improve Outcome: Progressing   Problem: Cardiovascular: Goal: Ability to achieve and maintain adequate cardiovascular perfusion will improve Outcome: Progressing Goal: Vascular access site(s) Level 0-1 will be maintained Outcome: Progressing   Problem: Health Behavior/Discharge Planning: Goal: Ability to safely manage health-related needs after discharge will improve Outcome: Progressing   Problem: Education: Goal:  Knowledge of General Education information will improve Description: Including pain rating scale, medication(s)/side effects and non-pharmacologic comfort measures Outcome: Progressing   Problem: Health Behavior/Discharge Planning: Goal: Ability to manage health-related needs will improve Outcome: Progressing   Problem: Clinical Measurements: Goal: Ability to maintain clinical measurements within normal limits will improve Outcome: Progressing Goal: Will remain free from infection Outcome: Progressing Goal: Diagnostic test results will improve Outcome: Progressing Goal: Respiratory complications will improve Outcome: Progressing Goal: Cardiovascular complication will be avoided Outcome: Progressing   Problem: Nutrition: Goal: Adequate nutrition will be maintained Outcome: Progressing   Problem: Coping: Goal: Level of anxiety will decrease Outcome: Progressing

## 2019-12-21 NOTE — Progress Notes (Addendum)
[address]       [place] [address]             [phone]      5 Days Post-Op Procedure(s) (LRB): CORONARY ARTERY BYPASS GRAFTING (CABG) TIMES 4 USING LEFT INTERNAL MAMMARY ARTERY AND ENDOSCOPICALLY HARVESTED RIGHT SAPHENOUS VEIN (N/A) TRANSESOPHAGEAL ECHOCARDIOGRAM (TEE) (N/A) Subjective: conts to slowly get stronger but not ambulating very far  Objective: Vital signs in last 24 hours: Temp:  [98.3 F (36.8 C)-98.7 F (37.1 C)] 98.4 F (36.9 C) (02/27 0807) Pulse Rate:  [84-102] 89 (02/27 0807) Cardiac Rhythm: Normal sinus rhythm (02/27 0700) Resp:  [20-23] 20 (02/27 0807) BP: (95-118)/(61-78) 115/78 (02/27 0807) SpO2:  [99 %-100 %] 100 % (02/27 0807) FiO2 (%):  [1 %] 1 % (02/27 0423) Weight:  [63.2 kg] 63.2 kg (02/27 0427)  Hemodynamic parameters for last 24 hours:    Intake/Output from previous day: 02/26 0701 - 02/27 0700 In: 360 [P.O.:360] Out: 1025 [Urine:1025] Intake/Output this shift: No intake/output data recorded.  General appearance: alert, cooperative and no distress Heart: regular rate and rhythm Lungs: mildly dim in bases Abdomen: benign Extremities: no edema Wound: incis healing well  Lab Results: Recent Labs    12/19/19 0230  WBC 8.0  HGB 8.3*  HCT 24.7*  PLT 149*   BMET:  Recent Labs    12/19/19 0230  NA 134*  K 4.0  CL 94*  CO2 30  GLUCOSE 107*  BUN 6*  CREATININE 0.80  CALCIUM 9.5    PT/INR: No results for input(s): LABPROT, INR in the last 72 hours. ABG    Component Value Date/Time   PHART 7.314 (L) 12/16/2019 2233   HCO3 24.3 12/16/2019 2233   TCO2 26 12/16/2019 2233   ACIDBASEDEF 2.0 12/16/2019 2233   O2SAT 98.0 12/16/2019 2233   CBG (last 3)  No results for input(s): GLUCAP in the last 72 hours.  Meds Scheduled Meds: . acetaminophen  1,000 mg Oral Q6H   Or  . acetaminophen (TYLENOL) oral liquid 160 mg/5 mL  1,000 mg Per Tube Q6H  . aspirin EC  325 mg Oral Daily   Or  . aspirin  324  mg Per Tube Daily  . bisacodyl  10 mg Oral Daily   Or  . bisacodyl  10 mg Rectal Daily  . Chlorhexidine Gluconate Cloth  6 each Topical Daily  . cholecalciferol  1,000 Units Oral Daily  . docusate sodium  200 mg Oral Daily  . enoxaparin (LOVENOX) injection  30 mg Subcutaneous QHS  . fluticasone  2 spray Each Nare Daily  . folic acid  1 mg Oral Daily  . furosemide  40 mg Oral Daily  . guaiFENesin  600 mg Oral BID  . levalbuterol  0.63 mg Nebulization TID  . loratadine  10 mg Oral Daily  . losartan  25 mg Oral Daily  . mouth rinse  15 mL Mouth Rinse BID  . metoprolol tartrate  37.5 mg Oral BID  . montelukast  10 mg Oral QHS  . multivitamin with minerals  1 tablet Oral Daily  . pantoprazole  40 mg Oral BID  . potassium chloride  40 mEq Oral Daily  . rosuvastatin  20 mg Oral Daily  . sodium chloride flush  3 mL Intravenous Q12H  . sodium chloride flush  3 mL Intravenous Q12H  . thiamine  100 mg Oral Daily   Or  . thiamine  100 mg Intravenous Daily  .  umeclidinium-vilanterol  1 puff Inhalation Daily   Continuous Infusions: . sodium chloride    . sodium chloride    . sodium chloride    . sodium chloride    . lactated ringers    . lactated ringers 20 mL/hr at 12/17/19 1100   PRN Meds:.sodium chloride, sodium chloride, ALPRAZolam, metoprolol tartrate, midazolam, morphine injection, ondansetron (ZOFRAN) IV, sodium chloride flush, sodium chloride flush, traMADol  Xrays No results found.  Assessment/Plan: S/P Procedure(s) (LRB): CORONARY ARTERY BYPASS GRAFTING (CABG) TIMES 4 USING LEFT INTERNAL MAMMARY ARTERY AND ENDOSCOPICALLY HARVESTED RIGHT SAPHENOUS VEIN (N/A) TRANSESOPHAGEAL ECHOCARDIOGRAM (TEE) (N/A) 1 conts with slow but steady overall progress 2 hemodyn stable, BP a little low at times. I think we can stop lasix as weight is down to preop. Reduce losartan dose 3 recheck labs in am 4 on 1 liter O2, cont to wean, he is using IS well 5 fairly weak with slow improvement,  cont PT/CR1 6 poss home 24-48 hours dep on progress 7 sugars adeq control, some insulin resistance with Hg A1c 6.1- cont to manage with diet as able  LOS: 9 days    John Giovanni [facility] 12/21/2019 Pager [phone]   Chart reviewed, patient examined, agree with above. He is still on 1 L oxygen. Preop PFT's showed moderate obstructive defect but room air ABG was ok. Try to wean oxygen and if sats < 90 he will need home oxygen. He was smoking 1 ppd prior to discharge and says he was short of breath with ambulation in the house, although that likely was his coronary disease too.

## 2019-12-22 LAB — CBC
HCT: 23.6 % — ABNORMAL LOW (ref 39.0–52.0)
Hemoglobin: 7.9 g/dL — ABNORMAL LOW (ref 13.0–17.0)
MCH: 32.4 pg (ref 26.0–34.0)
MCHC: 33.5 g/dL (ref 30.0–36.0)
MCV: 96.7 fL (ref 80.0–100.0)
Platelets: 322 10*3/uL (ref 150–400)
RBC: 2.44 MIL/uL — ABNORMAL LOW (ref 4.22–5.81)
RDW: 13.3 % (ref 11.5–15.5)
WBC: 6.6 10*3/uL (ref 4.0–10.5)
nRBC: 0 % (ref 0.0–0.2)

## 2019-12-22 LAB — BASIC METABOLIC PANEL
Anion gap: 8 (ref 5–15)
BUN: 9 mg/dL (ref 8–23)
CO2: 28 mmol/L (ref 22–32)
Calcium: 9.4 mg/dL (ref 8.9–10.3)
Chloride: 102 mmol/L (ref 98–111)
Creatinine, Ser: 0.82 mg/dL (ref 0.61–1.24)
GFR calc Af Amer: >60 mL/min (ref >60)
GFR calc non Af Amer: >60 mL/min (ref >60)
Glucose, Bld: 98 mg/dL (ref 70–99)
Potassium: 3.8 mmol/L (ref 3.5–5.1)
Sodium: 138 mmol/L (ref 135–145)

## 2019-12-22 MED ORDER — FE FUMARATE-B12-VIT C-FA-IFC PO CAPS
1.0000 | ORAL_CAPSULE | Freq: Two times a day (BID) | ORAL | Status: DC
  Administered 2019-12-22 – 2019-12-23 (×3): 1 via ORAL
  Filled 2019-12-22 (×3): qty 1

## 2019-12-22 MED ORDER — LEVALBUTEROL HCL 0.63 MG/3ML IN NEBU
0.6300 mg | INHALATION_SOLUTION | Freq: Two times a day (BID) | RESPIRATORY_TRACT | Status: DC
  Administered 2019-12-22 – 2019-12-23 (×2): 0.63 mg via RESPIRATORY_TRACT
  Filled 2019-12-22 (×2): qty 3

## 2019-12-22 NOTE — Progress Notes (Signed)
SATURATION QUALIFICATIONS: (This note is used to comply with regulatory documentation for home oxygen)  Patient Saturations on Room Air at Rest = 96 %  Patient Saturations on Room Air while Ambulating = 63 %  Patient Saturations on 3 Liters of oxygen while Ambulating = 96%  Please briefly explain why patient needs home oxygen:   Mr. Barnier had a room air sat of 96 % prior to walking.  He walked approximately 25 feet on room air and his O2 Sat dropped to 63 %.  Placed on 2 l O2 via [facility]; O2 Sat increased to 96%.  Mr. Schoch continued to walk approximately 100 feet more and his O2  Sat decreased to 75% on 2L of oxygen.  Had to increase the oxygen to 3 Liters to return back to room.

## 2019-12-22 NOTE — Plan of Care (Signed)
  Problem: Education: Goal: Ability to demonstrate management of disease process will improve Outcome: Progressing Goal: Ability to verbalize understanding of medication therapies will improve Outcome: Progressing Goal: Individualized Educational Video(s) Outcome: Progressing   Problem: Activity: Goal: Capacity to carry out activities will improve Outcome: Progressing   Problem: Cardiac: Goal: Ability to achieve and maintain adequate cardiopulmonary perfusion will improve Outcome: Progressing   Problem: Education: Goal: Knowledge of disease or condition will improve Outcome: Progressing Goal: Understanding of discharge needs will improve Outcome: Progressing   Problem: Health Behavior/Discharge Planning: Goal: Ability to identify changes in lifestyle to reduce recurrence of condition will improve Outcome: Progressing Goal: Identification of resources available to assist in meeting health care needs will improve Outcome: Progressing   Problem: Physical Regulation: Goal: Complications related to the disease process, condition or treatment will be avoided or minimized Outcome: Progressing   Problem: Safety: Goal: Ability to remain free from injury will improve Outcome: Progressing   Problem: Education: Goal: Understanding of CV disease, CV risk reduction, and recovery process will improve Outcome: Progressing Goal: Individualized Educational Video(s) Outcome: Progressing   Problem: Health Behavior/Discharge Planning: Goal: Ability to safely manage health-related needs after discharge will improve Outcome: Progressing   Problem: Education: Goal: Knowledge of General Education information will improve Description: Including pain rating scale, medication(s)/side effects and non-pharmacologic comfort measures Outcome: Progressing   Problem: Health Behavior/Discharge Planning: Goal: Ability to manage health-related needs will improve Outcome: Progressing   Problem:  Clinical Measurements: Goal: Ability to maintain clinical measurements within normal limits will improve Outcome: Progressing Goal: Will remain free from infection Outcome: Progressing Goal: Diagnostic test results will improve Outcome: Progressing Goal: Respiratory complications will improve Outcome: Progressing Goal: Cardiovascular complication will be avoided Outcome: Progressing   Problem: Clinical Measurements: Goal: Ability to maintain clinical measurements within normal limits will improve Outcome: Progressing Goal: Will remain free from infection Outcome: Progressing Goal: Diagnostic test results will improve Outcome: Progressing Goal: Respiratory complications will improve Outcome: Progressing Goal: Cardiovascular complication will be avoided Outcome: Progressing   Problem: Activity: Goal: Risk for activity intolerance will decrease Outcome: Progressing   Problem: Nutrition: Goal: Adequate nutrition will be maintained Outcome: Progressing

## 2019-12-22 NOTE — Progress Notes (Addendum)
[address]       [place] [address]             [phone]      6 Days Post-Op Procedure(s) (LRB): CORONARY ARTERY BYPASS GRAFTING (CABG) TIMES 4 USING LEFT INTERNAL MAMMARY ARTERY AND ENDOSCOPICALLY HARVESTED RIGHT SAPHENOUS VEIN (N/A) TRANSESOPHAGEAL ECHOCARDIOGRAM (TEE) (N/A) Subjective: Feels a little better, ambulation is improving  Objective: Vital signs in last 24 hours: Temp:  [98.3 F (36.8 C)-98.9 F (37.2 C)] 98.5 F (36.9 C) (02/28 0749) Pulse Rate:  [88-104] 100 (02/28 0749) Cardiac Rhythm: Normal sinus rhythm (02/28 0700) Resp:  [19-29] 20 (02/28 0749) BP: (103-122)/(63-79) 112/66 (02/28 0749) SpO2:  [97 %-100 %] 100 % (02/28 0817) FiO2 (%):  [2 %] 2 % (02/28 0327) Weight:  [65.4 kg] 65.4 kg (02/28 0439)  Hemodynamic parameters for last 24 hours:    Intake/Output from previous day: 02/27 0701 - 02/28 0700 In: 840 [P.O.:840] Out: 2165 [Urine:2165] Intake/Output this shift: No intake/output data recorded.  General appearance: alert, appears stated age and no distress Heart: regular rate and rhythm Lungs: mildly dim in lungs Abdomen: benign Extremities: no edema Wound: incis healinng well  Lab Results: Recent Labs    12/22/19 0251  WBC 6.6  HGB 7.9*  HCT 23.6*  PLT 322   BMET:  Recent Labs    12/22/19 0251  NA 138  K 3.8  CL 102  CO2 28  GLUCOSE 98  BUN 9  CREATININE 0.82  CALCIUM 9.4    PT/INR: No results for input(s): LABPROT, INR in the last 72 hours. ABG    Component Value Date/Time   PHART 7.314 (L) 12/16/2019 2233   HCO3 24.3 12/16/2019 2233   TCO2 26 12/16/2019 2233   ACIDBASEDEF 2.0 12/16/2019 2233   O2SAT 98.0 12/16/2019 2233   CBG (last 3)  No results for input(s): GLUCAP in the last 72 hours.  Meds Scheduled Meds: . aspirin EC  325 mg Oral Daily   Or  . aspirin  324 mg Per Tube Daily  . bisacodyl  10 mg Oral Daily   Or  . bisacodyl  10 mg Rectal Daily  . Chlorhexidine Gluconate  Cloth  6 each Topical Daily  . cholecalciferol  1,000 Units Oral Daily  . docusate sodium  200 mg Oral Daily  . enoxaparin (LOVENOX) injection  30 mg Subcutaneous QHS  . fluticasone  2 spray Each Nare Daily  . folic acid  1 mg Oral Daily  . guaiFENesin  600 mg Oral BID  . levalbuterol  0.63 mg Nebulization TID  . loratadine  10 mg Oral Daily  . losartan  12.5 mg Oral Daily  . mouth rinse  15 mL Mouth Rinse BID  . metoprolol tartrate  37.5 mg Oral BID  . montelukast  10 mg Oral QHS  . multivitamin with minerals  1 tablet Oral Daily  . pantoprazole  40 mg Oral BID  . rosuvastatin  20 mg Oral Daily  . sodium chloride flush  3 mL Intravenous Q12H  . sodium chloride flush  3 mL Intravenous Q12H  . thiamine  100 mg Oral Daily   Or  . thiamine  100 mg Intravenous Daily  . umeclidinium-vilanterol  1 puff Inhalation Daily   Continuous Infusions: . sodium chloride    . sodium chloride    . sodium chloride    . sodium chloride    . lactated ringers    . lactated ringers  20 mL/hr at 12/17/19 1100   PRN Meds:.sodium chloride, sodium chloride, ALPRAZolam, metoprolol tartrate, midazolam, morphine injection, ondansetron (ZOFRAN) IV, sodium chloride flush, sodium chloride flush, traMADol  Xrays No results found.  Assessment/Plan: S/P Procedure(s) (LRB): CORONARY ARTERY BYPASS GRAFTING (CABG) TIMES 4 USING LEFT INTERNAL MAMMARY ARTERY AND ENDOSCOPICALLY HARVESTED RIGHT SAPHENOUS VEIN (N/A) TRANSESOPHAGEAL ECHOCARDIOGRAM (TEE) (N/A)  1 steady slow progress in physical recovery 2 BP improved 3 sinus rhythm,some occas sinus tachy- cont current losartan and metoprolol dose for now 4 O2 sats ok on 2 liters, will do walk test to see if qualifies for home O2 5 H/H fairly low, would like to avoid transfusion - will add Trinsicon 6 normal renal fxn, does not appear volume overloaded 7 BS well controlled 8 plan for home in am+/- O2     LOS: 10 days    Lucas Giovanni PA-C Pager [phone]  [phone] 12/22/2019   Chart reviewed, patient examined, agree with above. He looks fairly good overall and feels well. Sat on RA at rest 96% and desat to 63% with ambulation, improved to 96% with 3L while ambulating. Plan home in the am on oxygen.

## 2019-12-23 MED ORDER — LOSARTAN POTASSIUM 25 MG PO TABS: 12.5000 mg | ORAL_TABLET | Freq: Every day | ORAL | 3 refills | Status: DC

## 2019-12-23 MED ORDER — TRAMADOL HCL 50 MG PO TABS: 50.0000 mg | ORAL_TABLET | ORAL | 0 refills | Status: DC | PRN

## 2019-12-23 MED ORDER — GUAIFENESIN ER 600 MG PO TB12: 600.0000 mg | ORAL_TABLET | Freq: Two times a day (BID) | ORAL | Status: DC | PRN

## 2019-12-23 MED ORDER — CENTRUM PO CHEW: 1.0000 | CHEWABLE_TABLET | Freq: Every day | ORAL | 3 refills | Status: DC

## 2019-12-23 MED ORDER — ASPIRIN 325 MG PO TBEC: 325.0000 mg | DELAYED_RELEASE_TABLET | Freq: Every day | ORAL | 0 refills | Status: DC

## 2019-12-23 MED ORDER — METOPROLOL TARTRATE 25 MG PO TABS: 37.5000 mg | ORAL_TABLET | Freq: Two times a day (BID) | ORAL | 3 refills | Status: DC
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
## 2019-12-23 NOTE — Progress Notes (Signed)
      [address]       [place] [address]             [phone]      7 Days Post-Op Procedure(s) (LRB): CORONARY ARTERY BYPASS GRAFTING (CABG) TIMES 4 USING LEFT INTERNAL MAMMARY ARTERY AND ENDOSCOPICALLY HARVESTED RIGHT SAPHENOUS VEIN (N/A) TRANSESOPHAGEAL ECHOCARDIOGRAM (TEE) (N/A)   Subjective:  No new complaints.  Ready to go home.  + ambulation  + BM Objective: Vital signs in last 24 hours: Temp:  [98.1 F (36.7 C)-98.8 F (37.1 C)] 98.3 F (36.8 C) (03/01 0409) Pulse Rate:  [85-100] 89 (03/01 0409) Cardiac Rhythm: Normal sinus rhythm (02/28 2100) Resp:  [15-23] 21 (03/01 0409) BP: (104-124)/(63-76) 104/66 (03/01 0409) SpO2:  [97 %-100 %] 100 % (03/01 0409) FiO2 (%):  [2 %] 2 % (03/01 0409) Weight:  [64.6 kg] 64.6 kg (03/01 0409)  Intake/Output from previous day: 02/28 0701 - 03/01 0700 In: 840 [P.O.:840] Out: 930 [Urine:930]  General appearance: alert, cooperative and no distress Heart: regular rate and rhythm, sternum stable, + click with cough Lungs: wheezes mild bilaterally Abdomen: soft, non-tender; bowel sounds normal; no masses,  no organomegaly Extremities: edema none present Wound: clean and dry  Lab Results: Recent Labs    12/22/19 0251  WBC 6.6  HGB 7.9*  HCT 23.6*  PLT 322   BMET:  Recent Labs    12/22/19 0251  NA 138  K 3.8  CL 102  CO2 28  GLUCOSE 98  BUN 9  CREATININE 0.82  CALCIUM 9.4    PT/INR: No results for input(s): LABPROT, INR in the last 72 hours. ABG    Component Value Date/Time   PHART 7.314 (L) 12/16/2019 2233   HCO3 24.3 12/16/2019 2233   TCO2 26 12/16/2019 2233   ACIDBASEDEF 2.0 12/16/2019 2233   O2SAT 98.0 12/16/2019 2233   CBG (last 3)  No results for input(s): GLUCAP in the last 72 hours.  Assessment/Plan: S/P Procedure(s) (LRB): CORONARY ARTERY BYPASS GRAFTING (CABG) TIMES 4 USING LEFT INTERNAL MAMMARY ARTERY AND ENDOSCOPICALLY HARVESTED RIGHT SAPHENOUS VEIN (N/A) TRANSESOPHAGEAL  ECHOCARDIOGRAM (TEE) (N/A)  1. CV- NSR, BP controlled- continue Lopressor, Cozaar 2. Pulm- + hypoxia, underlying COPD.Marland Kitchen will arrange home oxygen use, continue IS, home inhaler regimen 3. Renal- creatinine has been stable, lytes okay 4. Expected post operative blood loss anemia, stable, continue iron 5. Deconditioning- mild, PT recs home health, placed orders for RN, PT,  6. dispo- patient stable, will d/c home today with home oxygen, and home PT/RN care   LOS: 11 days    Ellwood Handler, PA-C  12/23/2019

## 2019-12-23 NOTE — Progress Notes (Signed)
Discharged to home with family office visits in place teaching done  
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
## 2019-12-23 NOTE — Care Management Important Message (Signed)
Important Message  Patient Details  Name: TYSON PARKISON MRN: [id number] Date of Birth: 01/20/1951   Medicare Important Message Given:  Yes     Shelda Altes 12/23/2019, 10:33 AM
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
## 2019-12-24 DIAGNOSIS — J449 Chronic obstructive pulmonary disease, unspecified: Secondary | ICD-10-CM | POA: Diagnosis not present

## 2019-12-24 DIAGNOSIS — J90 Pleural effusion, not elsewhere classified: Secondary | ICD-10-CM | POA: Diagnosis not present

## 2019-12-24 DIAGNOSIS — I2510 Atherosclerotic heart disease of native coronary artery without angina pectoris: Secondary | ICD-10-CM | POA: Diagnosis not present

## 2019-12-24 DIAGNOSIS — Z48812 Encounter for surgical aftercare following surgery on the circulatory system: Secondary | ICD-10-CM | POA: Diagnosis not present

## 2019-12-25 ENCOUNTER — Encounter (HOSPITAL_COMMUNITY): Payer: Self-pay | Admitting: *Deleted

## 2019-12-25 DIAGNOSIS — J449 Chronic obstructive pulmonary disease, unspecified: Secondary | ICD-10-CM | POA: Diagnosis not present

## 2019-12-25 DIAGNOSIS — Z8616 Personal history of COVID-19: Secondary | ICD-10-CM | POA: Diagnosis not present

## 2019-12-25 DIAGNOSIS — I10 Essential (primary) hypertension: Secondary | ICD-10-CM | POA: Diagnosis not present

## 2019-12-25 DIAGNOSIS — Z7982 Long term (current) use of aspirin: Secondary | ICD-10-CM | POA: Diagnosis not present

## 2019-12-25 DIAGNOSIS — Z720 Tobacco use: Secondary | ICD-10-CM | POA: Diagnosis not present

## 2019-12-25 DIAGNOSIS — E785 Hyperlipidemia, unspecified: Secondary | ICD-10-CM | POA: Diagnosis not present

## 2019-12-25 DIAGNOSIS — Z951 Presence of aortocoronary bypass graft: Secondary | ICD-10-CM | POA: Diagnosis not present

## 2019-12-25 DIAGNOSIS — Z48812 Encounter for surgical aftercare following surgery on the circulatory system: Secondary | ICD-10-CM | POA: Diagnosis not present

## 2019-12-25 DIAGNOSIS — I214 Non-ST elevation (NSTEMI) myocardial infarction: Secondary | ICD-10-CM | POA: Diagnosis not present

## 2019-12-25 DIAGNOSIS — Z9981 Dependence on supplemental oxygen: Secondary | ICD-10-CM | POA: Diagnosis not present

## 2019-12-25 DIAGNOSIS — I25110 Atherosclerotic heart disease of native coronary artery with unstable angina pectoris: Secondary | ICD-10-CM | POA: Diagnosis not present

## 2019-12-25 DIAGNOSIS — R918 Other nonspecific abnormal finding of lung field: Secondary | ICD-10-CM | POA: Diagnosis not present

## 2019-12-25 NOTE — Progress Notes (Signed)
Referral received and verified for MD signature.  Follow up appt is 12/27/19 with CV surgical and 01/02/20 with Cardiology. Insurance benefits and eligibility to be determined. Pt will be contacted and scheduled upon the satisfactory completion of both follow up appt with Cardiology and CV surgical. Maurice Small RN, BSN Cardiac and Pulmonary Rehab Nurse Navigator

## 2019-12-26 NOTE — Progress Notes (Signed)
This encounter was created in error - please disregard.

## 2019-12-27 ENCOUNTER — Other Ambulatory Visit: Payer: Self-pay

## 2019-12-27 ENCOUNTER — Encounter: Payer: Self-pay | Admitting: Thoracic Surgery (Cardiothoracic Vascular Surgery)

## 2019-12-27 ENCOUNTER — Ambulatory Visit (INDEPENDENT_AMBULATORY_CARE_PROVIDER_SITE_OTHER): Payer: Self-pay | Admitting: Thoracic Surgery (Cardiothoracic Vascular Surgery)

## 2019-12-27 ENCOUNTER — Other Ambulatory Visit: Payer: Self-pay | Admitting: Physician Assistant

## 2019-12-27 VITALS — BP 153/84 | HR 98 | Temp 97.8°F | Resp 24 | Ht 71.0 in | Wt 141.0 lb

## 2019-12-27 DIAGNOSIS — I2510 Atherosclerotic heart disease of native coronary artery without angina pectoris: Secondary | ICD-10-CM

## 2019-12-27 DIAGNOSIS — Z7982 Long term (current) use of aspirin: Secondary | ICD-10-CM | POA: Diagnosis not present

## 2019-12-27 DIAGNOSIS — J449 Chronic obstructive pulmonary disease, unspecified: Secondary | ICD-10-CM | POA: Diagnosis not present

## 2019-12-27 DIAGNOSIS — R6889 Other general symptoms and signs: Secondary | ICD-10-CM | POA: Diagnosis not present

## 2019-12-27 DIAGNOSIS — E785 Hyperlipidemia, unspecified: Secondary | ICD-10-CM | POA: Diagnosis not present

## 2019-12-27 DIAGNOSIS — I25110 Atherosclerotic heart disease of native coronary artery with unstable angina pectoris: Secondary | ICD-10-CM | POA: Diagnosis not present

## 2019-12-27 DIAGNOSIS — Z951 Presence of aortocoronary bypass graft: Secondary | ICD-10-CM

## 2019-12-27 DIAGNOSIS — I214 Non-ST elevation (NSTEMI) myocardial infarction: Secondary | ICD-10-CM | POA: Diagnosis not present

## 2019-12-27 DIAGNOSIS — Z48812 Encounter for surgical aftercare following surgery on the circulatory system: Secondary | ICD-10-CM | POA: Diagnosis not present

## 2019-12-27 DIAGNOSIS — Z9981 Dependence on supplemental oxygen: Secondary | ICD-10-CM | POA: Diagnosis not present

## 2019-12-27 DIAGNOSIS — Z720 Tobacco use: Secondary | ICD-10-CM | POA: Diagnosis not present

## 2019-12-27 DIAGNOSIS — Z8616 Personal history of COVID-19: Secondary | ICD-10-CM | POA: Diagnosis not present

## 2019-12-27 DIAGNOSIS — R918 Other nonspecific abnormal finding of lung field: Secondary | ICD-10-CM | POA: Diagnosis not present

## 2019-12-27 DIAGNOSIS — R911 Solitary pulmonary nodule: Secondary | ICD-10-CM

## 2019-12-27 DIAGNOSIS — I10 Essential (primary) hypertension: Secondary | ICD-10-CM | POA: Diagnosis not present

## 2019-12-27 NOTE — Progress Notes (Signed)
      [address]       [place] [address]             [phone]        Joriel E Saraceno [place] Medical Record #[id number] Date of Birth: Jun 04, 1951  Referring: Martinique, Peter M, MD Primary Care: [name], DO Primary Cardiologist:Peter Martinique, MD  Reason for visit:   follow-up  History of Present Illness:     Mr. Gorniak presents for his first follow-up appointment.  Remains on 3 L of oxygen, and has already started smoking.  He also complains of some shortness of breath.  Physical Exam: BP (!) 153/84 (BP Location: Right Arm, Patient Position: Sitting, Cuff Size: Normal)   Pulse 98   Temp 97.8 F (36.6 C) (Oral)   Resp (!) 24   Ht 5\' 11"  (1.803 m)   Wt 141 lb (64 kg)   SpO2 100% Comment: O2 at 3 LPM via nasal cannula  BMI 19.67 kg/m   Alert NAD Incision clean.  Sternum stable Abdomen soft, ND No peripheral edema       Assessment / Plan:   69 year old male status post CABG doing well.  Regards to his pulmonary nodule he was scheduled for a CT-guided biopsy once he is on lower doses of oxygen.  We will see him back in 1 month with a chest x-ray.   Lajuana Matte 12/27/2019 1:40 PM

## 2019-12-28 DIAGNOSIS — I214 Non-ST elevation (NSTEMI) myocardial infarction: Secondary | ICD-10-CM | POA: Diagnosis not present

## 2019-12-28 DIAGNOSIS — Z951 Presence of aortocoronary bypass graft: Secondary | ICD-10-CM | POA: Diagnosis not present

## 2019-12-28 DIAGNOSIS — Z720 Tobacco use: Secondary | ICD-10-CM | POA: Diagnosis not present

## 2019-12-28 DIAGNOSIS — Z48812 Encounter for surgical aftercare following surgery on the circulatory system: Secondary | ICD-10-CM | POA: Diagnosis not present

## 2019-12-28 DIAGNOSIS — J449 Chronic obstructive pulmonary disease, unspecified: Secondary | ICD-10-CM | POA: Diagnosis not present

## 2019-12-28 DIAGNOSIS — R918 Other nonspecific abnormal finding of lung field: Secondary | ICD-10-CM | POA: Diagnosis not present

## 2019-12-28 DIAGNOSIS — Z7982 Long term (current) use of aspirin: Secondary | ICD-10-CM | POA: Diagnosis not present

## 2019-12-28 DIAGNOSIS — Z9981 Dependence on supplemental oxygen: Secondary | ICD-10-CM | POA: Diagnosis not present

## 2019-12-28 DIAGNOSIS — Z8616 Personal history of COVID-19: Secondary | ICD-10-CM | POA: Diagnosis not present

## 2019-12-28 DIAGNOSIS — E785 Hyperlipidemia, unspecified: Secondary | ICD-10-CM | POA: Diagnosis not present

## 2019-12-28 DIAGNOSIS — I10 Essential (primary) hypertension: Secondary | ICD-10-CM | POA: Diagnosis not present

## 2019-12-28 DIAGNOSIS — I25110 Atherosclerotic heart disease of native coronary artery with unstable angina pectoris: Secondary | ICD-10-CM | POA: Diagnosis not present

## 2019-12-30 ENCOUNTER — Telehealth (HOSPITAL_COMMUNITY): Payer: Self-pay

## 2019-12-30 ENCOUNTER — Other Ambulatory Visit: Payer: Self-pay | Admitting: Family Medicine

## 2019-12-30 ENCOUNTER — Ambulatory Visit: Payer: Medicare Other

## 2019-12-30 NOTE — Chronic Care Management (AMB) (Signed)
  Chronic Care Management   Note  12/30/2019 Name: Lucas [name] MRN: [id number] DOB: August 05, 1951  EMMI- Red on EMMI Alert:  Name: Lucas [name] DOB: 09-18-2051; Age 69 Yesterday's Red Flags: 1     GENERAL DISCHARGE FOR FOLLOW-UP MC-4E CV-SURG PCU-GD    Encounter ID: [id number] Patient ID: [id number]    Series Access Code: [id number] Enrolled Date: 12/24/19    Call Language: English Start Date: 12/25/19     Details Sat 12/28/19 Wed 12/25/19     [phone] Day # 4 Day # 1     PATIENT GENERAL DISCHARGE FOR FOLLOW-UP: COPING GENERAL DISCHARGE FOR FOLLOW-UP: DISCHARGE, FOLLOW-UP, MEDS, WOUND CARE     Time of Last Call Attempt 1:03 PM 10:02 AM     Who reached Patient Patient     Know who to call about changes in condition?   No     Unfilled prescriptions?   Yes     Sad/hopeless/anxious/empty? Yes       Emmi Program: PATIENT SATISFACTION: AFTER DISCHARGE EXPECTATIONS   Issued     Outreach attempt #1  to patient. The patient was able to verify HIPAA.  The RN Care Manager  introduced myself and  explained reason for my call. The patient stated that he did receive the automated calls.  He stated that he understood who to call if he had any changes in his condition.  He stated that he just had an appointment with Dr Lowella Dell on this past Friday. He states that he was using 3 liters of oxygen and breathing ok denies any other symptoms at the time. The patient and I discussed and reviewed his medications.  Two of his medications he did not have his pro air inhaler and his Crestor 20 mg.  I called his pharmacy [facility] on [address] and asked them to send in refill request.  I spoke with Shiny.  I also asked the patient was he feeling sad , hopeless or have any anxiety and his response was no.  I explained to the patient that he may receive more automated calls and if he gives a abnormal response that I will get a Email.  I will be calling him back to check on him and see how he is doing.  He said that was fine  and he appreciated me calling.  I gave the patient my number in case he may have any questions or concerns.     Plan: follow up with the patient in 5-7 days to see if he received his medications    Lucas Arms RN, BSN, [place] Phone: [phone] Office: [phone] Fax: [phone]       Follow up plan: The care management team will reach out to the patient again over the next 5-7 days.  The patient has been provided with contact information for the care management team and has been advised to call with any health related questions or concerns.   Lucas Arms RN, BSN, [facility] Care Management Coordinator [place] Phone: [phone] Fax: [phone]

## 2019-12-30 NOTE — Telephone Encounter (Signed)
Pt insurance is active and benefits verified through [facility] Medicare Co-pay 0, DED 0/0 met, out of pocket $4,500/$250 met, co-insurance 0%. no pre-authorization required. Passport, 12/30/2019@10 :47am, REF# [id number]  Will contact patient to see if he is interested in the Cardiac Rehab Program. If interested, patient will need to complete follow up appt. Once completed, patient will be contacted for scheduling upon review by the RN Navigator.

## 2019-12-30 NOTE — Patient Instructions (Signed)
Visit Information

## 2019-12-31 DIAGNOSIS — E785 Hyperlipidemia, unspecified: Secondary | ICD-10-CM | POA: Diagnosis not present

## 2019-12-31 DIAGNOSIS — I10 Essential (primary) hypertension: Secondary | ICD-10-CM | POA: Diagnosis not present

## 2019-12-31 DIAGNOSIS — Z9981 Dependence on supplemental oxygen: Secondary | ICD-10-CM | POA: Diagnosis not present

## 2019-12-31 DIAGNOSIS — J449 Chronic obstructive pulmonary disease, unspecified: Secondary | ICD-10-CM | POA: Diagnosis not present

## 2019-12-31 DIAGNOSIS — Z8616 Personal history of COVID-19: Secondary | ICD-10-CM | POA: Diagnosis not present

## 2019-12-31 DIAGNOSIS — Z720 Tobacco use: Secondary | ICD-10-CM | POA: Diagnosis not present

## 2019-12-31 DIAGNOSIS — Z951 Presence of aortocoronary bypass graft: Secondary | ICD-10-CM | POA: Diagnosis not present

## 2019-12-31 DIAGNOSIS — R918 Other nonspecific abnormal finding of lung field: Secondary | ICD-10-CM | POA: Diagnosis not present

## 2019-12-31 DIAGNOSIS — I214 Non-ST elevation (NSTEMI) myocardial infarction: Secondary | ICD-10-CM | POA: Diagnosis not present

## 2019-12-31 DIAGNOSIS — Z48812 Encounter for surgical aftercare following surgery on the circulatory system: Secondary | ICD-10-CM | POA: Diagnosis not present

## 2019-12-31 DIAGNOSIS — I25110 Atherosclerotic heart disease of native coronary artery with unstable angina pectoris: Secondary | ICD-10-CM | POA: Diagnosis not present

## 2019-12-31 DIAGNOSIS — Z7982 Long term (current) use of aspirin: Secondary | ICD-10-CM | POA: Diagnosis not present

## 2020-01-01 ENCOUNTER — Ambulatory Visit: Payer: Self-pay

## 2020-01-01 NOTE — Chronic Care Management (AMB) (Signed)
  Chronic Care Management   Note  01/01/2020 Name: Lucas [name] MRN: [id number] DOB: 17-Nov-1950   RN Case Manager received called from the patient confused because he states that his wife called about his appointment tomorrow and was told that she would not be able to come with him.  I explain they may not have understood that he was in a wheelchair and had oxygen and needed her for assistance. I told him that he is allowed to have some one to help him.  She may not be allowed to go into the room with him but would be able to help him in the building. He stated that he would call back and give them more information.   Follow up plan: Will call the patient at the next scheduled follow up   [place], BSN, [facility] Care Management Coordinator [place] Phone: [phone] Fax: [phone]

## 2020-01-02 ENCOUNTER — Other Ambulatory Visit: Payer: Self-pay

## 2020-01-02 ENCOUNTER — Ambulatory Visit: Payer: Medicare Other | Admitting: Physician Assistant

## 2020-01-02 ENCOUNTER — Encounter: Payer: Self-pay | Admitting: Physician Assistant

## 2020-01-02 VITALS — BP 100/40 | HR 97 | Ht 71.0 in | Wt 137.0 lb

## 2020-01-02 DIAGNOSIS — R6889 Other general symptoms and signs: Secondary | ICD-10-CM | POA: Diagnosis not present

## 2020-01-02 DIAGNOSIS — I25810 Atherosclerosis of coronary artery bypass graft(s) without angina pectoris: Secondary | ICD-10-CM | POA: Diagnosis not present

## 2020-01-02 DIAGNOSIS — Z951 Presence of aortocoronary bypass graft: Secondary | ICD-10-CM | POA: Diagnosis not present

## 2020-01-02 DIAGNOSIS — Z9981 Dependence on supplemental oxygen: Secondary | ICD-10-CM | POA: Diagnosis not present

## 2020-01-02 DIAGNOSIS — Z48812 Encounter for surgical aftercare following surgery on the circulatory system: Secondary | ICD-10-CM | POA: Diagnosis not present

## 2020-01-02 DIAGNOSIS — I25110 Atherosclerotic heart disease of native coronary artery with unstable angina pectoris: Secondary | ICD-10-CM | POA: Diagnosis not present

## 2020-01-02 DIAGNOSIS — J449 Chronic obstructive pulmonary disease, unspecified: Secondary | ICD-10-CM

## 2020-01-02 DIAGNOSIS — Z720 Tobacco use: Secondary | ICD-10-CM

## 2020-01-02 DIAGNOSIS — E785 Hyperlipidemia, unspecified: Secondary | ICD-10-CM | POA: Diagnosis not present

## 2020-01-02 DIAGNOSIS — I214 Non-ST elevation (NSTEMI) myocardial infarction: Secondary | ICD-10-CM | POA: Diagnosis not present

## 2020-01-02 DIAGNOSIS — Z7982 Long term (current) use of aspirin: Secondary | ICD-10-CM | POA: Diagnosis not present

## 2020-01-02 DIAGNOSIS — Z8616 Personal history of COVID-19: Secondary | ICD-10-CM | POA: Diagnosis not present

## 2020-01-02 DIAGNOSIS — R918 Other nonspecific abnormal finding of lung field: Secondary | ICD-10-CM | POA: Diagnosis not present

## 2020-01-02 DIAGNOSIS — R911 Solitary pulmonary nodule: Secondary | ICD-10-CM

## 2020-01-02 DIAGNOSIS — I10 Essential (primary) hypertension: Secondary | ICD-10-CM | POA: Diagnosis not present

## 2020-01-02 DIAGNOSIS — F1020 Alcohol dependence, uncomplicated: Secondary | ICD-10-CM

## 2020-01-02 DIAGNOSIS — R7303 Prediabetes: Secondary | ICD-10-CM

## 2020-01-02 MED ORDER — ASPIRIN EC 325 MG PO TBEC: 325.0000 mg | DELAYED_RELEASE_TABLET | Freq: Every day | ORAL | 3 refills | Status: DC

## 2020-01-02 NOTE — Patient Instructions (Signed)
Medication Instructions:   RESTART Aspirin 325 mg daily *If you need a refill on your cardiac medications before your next appointment, please call your pharmacy*  Lab Work: NONE ordered at this time of appointment   If you have labs (blood work) drawn today and your tests are completely normal, you will receive your results only by: Marland Kitchen MyChart Message (if you have MyChart) OR . A paper copy in the mail If you have any lab test that is abnormal or we need to change your treatment, we will call you to review the results.  Testing/Procedures: NONE ordered at this time of appointment   Follow-Up: At [facility], you and your health needs are our priority.  As part of our continuing mission to provide you with exceptional heart care, we have created designated Provider Care Teams.  These Care Teams include your primary Cardiologist (physician) and Advanced Practice Providers (APPs -  Physician Assistants and Nurse Practitioners) who all work together to provide you with the care you need, when you need it.  We recommend signing up for the patient portal called "MyChart".  Sign up information is provided on this After Visit Summary.  MyChart is used to connect with patients for Virtual Visits (Telemedicine).  Patients are able to view lab/test results, encounter notes, upcoming appointments, etc.  Non-urgent messages can be sent to your provider as well.   To learn more about what you can do with MyChart, go to [email or web address].    Your next appointment:   1 month(s) with Lucas Deforest, PA-C 3 month(s) with Lucas Martinique, MD  The format for your next appointment:   In Person  Provider:   Peter Martinique, MD or Lucas Deforest, PA-C  Other Instructions  BRING all of your medication bottles with you to your next appointment

## 2020-01-02 NOTE — Progress Notes (Signed)
Cardiology Office Note:    Date:  01/04/2020   ID:  Lucas [name], DOB 06-30-1951, MRN [id number]  PCP:  [name], DO  Cardiologist:  Peter Martinique, MD  Electrophysiologist:  None   Referring MD: [name], DO   Chief Complaint  Patient presents with  . Hospitalization Follow-up    post CABG    History of Present Illness:    Lucas [name] is a 69 y.o. male with a hx of HTN, HLD, EtOH abuse, tobacco abuse, COPD and CAD.  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  He later presented to [facility] on 10/24/2017 with chest pain.  Cardiac catheterization at the time revealed 35% proximal RCA, 85% and 50% sequential lesion in mid RCA, 70% ostial left circumflex, 65% left main/proximal LAD, occluded D2.  The case was discussed with Dr. Martinique, the LAD and left circumflex lesion was not amenable to PCI and were not critical.  The mid RCA lesion was more severe however would require atherectomy and stenting.  Given ongoing alcohol abuse, Mallory-Weiss tear and medication noncompliance, it was decided to manage medically.  Patient had COVID-19 infection in October 2020.    More recently, patient was admitted to the hospital on 12/12/2019 with chest pain.  He was ruled in for NSTEMI by enzymes.  Cardiac catheterization showed severe calcified three-vessel disease with 80% distal left main to proximal LAD, 100% occluded ostial D2, 85% ostial left circumflex lesion, 60% proximal RCA, 99% mid RCA, 95% proximal to mid left circumflex lesion, 60% ostial D1, EF 50 to 55%.  Echocardiogram obtained on the same day showed EF 50 to 55%, mild MR.  Patient eventually underwent CABG x4 by Dr. Kipp Brood on 12/16/2019 with LIMA-LAD, SVG-PDA, SVG-D1, and SVG-OM2.  Hemoglobin A1c prior to the surgery was 6.1 which placed the patient in the prediabetes range.  Postprocedure, Lopressor was increased to 37.5 mg twice daily.  Noted during the  hospitalization, patient had CT angiogram of the chest that demonstrated new 11 mm spiculated nodule in the right upper lobe.  Given the history of smoking, this likely represent primary bronchogenic carcinoma.  Patient presents today for cardiology office visit.  Since discharge, he has already been seen by Dr. Kipp Brood.  He is currently still on oxygen.  Dr. Kipp Brood plans to reassess his pulmonary function in 1 month before arranging the CT-guided biopsy of the lung nodule.  Otherwise he denies any exertional chest discomfort.  He does have chest soreness from the sternotomy scar.  Overall he is doing quite well from cardiology perspective.  Instead of the 325 mg aspirin, he is on the 81 mg aspirin.  I encouraged him to take the high-dose aspirin for minimum of 16-month, and Dr. Martinique can decide when to lower the dosage of aspirin back down to 81 mg daily.  His blood pressure is borderline today however he is quite confused about the dosage of metoprolol and losartan he is taking at home.  I asked him to verify the medication dosage against his medication list.  I want to see him back in 1 month for reassessment at which time he should bring all of his medication bottles with him.  He can follow-up with Dr. Martinique in 3 months.  Otherwise he does not have any lower extremity edema, orthopnea or PND.  He is walking at home however still fairly deconditioned at this time due to baseline pulmonary issue.  He says he has completely quit smoking the day he discharged from the hospital.  Past Medical History:  Diagnosis Date  . Acute blood loss anemia 06/21/2018  . Acute upper GI bleed 06/21/2018  . Allergy   . Bilateral AVN of femurs ([place]) 06/25/2018   See 11/2017 Chest CT report  . COPD 12/21/2006  . Environmental allergies   . History of femur fracture 06/25/2018   11/2017 CT AP showed Remote nonunited right femur greater trochanter fracture  . History of Fracture of right tibia 06/26/2018  . History of  multiple right thoacic rib fractures 06/25/2018  . History of Right clavicular fracture 06/25/2018  . HYPERLIPIDEMIA 12/21/2006  . HYPERTENSION, BENIGN SYSTEMIC 12/21/2006  . Mallory-Weiss tear   . TOBACCO DEPENDENCE 12/21/2006    Past Surgical History:  Procedure Laterality Date  . ANKLE SURGERY     left ankle  . APPLICATION OF WOUND VAC  01/06/2012   Procedure: APPLICATION OF WOUND VAC;  Surgeon: Meredith Pel, MD;  Location: [facility];  Service: Orthopedics;  Laterality: Left;  . APPLICATION OF WOUND VAC Right 10/17/2015   Procedure: APPLICATION OF WOUND VAC;  Surgeon: Leandrew Koyanagi, MD;  Location: [place];  Service: Orthopedics;  Laterality: Right;  . BIOPSY  06/22/2018   Procedure: BIOPSY;  Surgeon: [id number] v, DO;  Location: [facility];  Service: Gastroenterology;;  . CORONARY ARTERY BYPASS GRAFT N/A 12/16/2019   Procedure: CORONARY ARTERY BYPASS GRAFTING (CABG) TIMES 4 USING LEFT INTERNAL MAMMARY ARTERY AND ENDOSCOPICALLY HARVESTED RIGHT SAPHENOUS VEIN;  Surgeon: Lajuana Matte, MD;  Location: [place];  Service: Open Heart Surgery;  Laterality: N/A;  . ESOPHAGOGASTRODUODENOSCOPY (EGD) WITH PROPOFOL N/A 06/22/2018   Procedure: ESOPHAGOGASTRODUODENOSCOPY (EGD) WITH PROPOFOL;  Surgeon: [id number] v, DO;  Location: [place] [facility];  Service: Gastroenterology;  Laterality: N/A;  . EXTERNAL FIXATION LEG  01/06/2012   Procedure: EXTERNAL FIXATION LEG;  Surgeon: Meredith Pel, MD;  [facility]: [facility];  Service: Orthopedics;  Laterality: Left;  Smith-nephew external fixator set  . EXTERNAL FIXATION LEG Right 09/16/2015   Procedure: EXTERNAL FIXATION LEG/LARGE;  Surgeon: Mcarthur Rossetti, MD;  Location: [place];  Service: Orthopedics;  Laterality: Right;  . EXTERNAL FIXATION REMOVAL Right 09/22/2015   Procedure: REMOVAL EXTERNAL FIXATION LEG;  Surgeon: Mcarthur Rossetti, MD;  Location: [place];  Service: Orthopedics;  Laterality: Right;  . FASCIOTOMY  01/10/2012   Procedure:  FASCIOTOMY;  Surgeon: Rozanna Box, MD;  Location: [place];  Service: Orthopedics;  Laterality: Left;  status post fasciotomy with wound vac left calf; adjustment external fixator left tibia under fluoro; retention suture/wound vac placement left lateral calf wound  . FASCIOTOMY  01/06/2012   Procedure: FASCIOTOMY;  Surgeon: Meredith Pel, MD;  [facility];  Service: Orthopedics;  Laterality: Left;  Four compartment fasciotomy  . HOT HEMOSTASIS N/A 06/22/2018   Procedure: HEMOSTASIS;  Surgeon: [id number] v, DO;  [facility]: [facility];  Service: Gastroenterology;  Laterality: N/A;  Epinepherine and clip placed  . I & D EXTREMITY  01/12/2012   Procedure: IRRIGATION AND DEBRIDEMENT EXTREMITY;  Surgeon: Rozanna Box, MD;  Location: [place];  Service: Orthopedics;  Laterality: Left;  LAYERD CLOSURE LEFT LEG WOUND  . I & D EXTREMITY Right 10/17/2015   Procedure: IRRIGATION AND DEBRIDEMENT EXTREMITY;  Surgeon: Leandrew Koyanagi, MD;  Location: [place];  Service: Orthopedics;  Laterality: Right;  . I & D EXTREMITY Right 10/20/2015   Procedure: REPEAT IRRIGATION AND DEBRIDEMENT EXTREMITY, right lower leg;  Surgeon: Harrell Gave  Kerry Fort, MD;  Location: [place];  Service: Orthopedics;  Laterality: Right;  . KNEE SURGERY     left knee and right knee  . LEFT HEART CATH AND CORONARY ANGIOGRAPHY N/A 06/27/2018   Procedure: LEFT HEART CATH AND CORONARY ANGIOGRAPHY;  Surgeon: Martinique, Peter M, MD;  Location: [place] [facility];  Service: Cardiovascular;  Laterality: N/A;  . LEFT HEART CATH AND CORONARY ANGIOGRAPHY N/A 12/12/2019   Procedure: LEFT HEART CATH AND CORONARY ANGIOGRAPHY;  Surgeon: Wellington Hampshire, MD;  Location: [place] [facility];  Service: Cardiovascular;  Laterality: N/A;  . MASS EXCISION Left 09/22/2015   Procedure: EXCISION MASS;  Surgeon: Mcarthur Rossetti, MD;  Location: [place];  Service: Orthopedics;  Laterality: Left;  . ORIF TIBIA PLATEAU  02/07/2012   Procedure: OPEN REDUCTION  INTERNAL FIXATION (ORIF) TIBIAL PLATEAU;  Surgeon: Rozanna Box, MD;  Location: [place];  Service: Orthopedics;  Laterality: Left;  ORIF LEFT TIBIAL PLATEAU/ REMOVE EXTERNAL FIXATION LEFT LEG  . ORIF TIBIA PLATEAU Right 09/22/2015   Procedure: REMOVAL OF EXTERNAL FIXATOR RIGHT LEG, OPEN REDUCTION INTERNAL FIXATION (ORIF) RIGHT TIBIAL PLATEAU, EXCISION SMALL MASS LEFT LEG;  Surgeon: Mcarthur Rossetti, MD;  Location: [place];  Service: Orthopedics;  Laterality: Right;  . SKIN SPLIT GRAFT Right 10/23/2015   Procedure: RIGHT GASTROC FLAP SPLIT THICKNESS SKIN GRAFT FROM RIGHT THING TO RIGHT LEG;  Surgeon: Irene Limbo, MD;  Location: [place];  Service: Plastics;  Laterality: Right;  . TEE WITHOUT CARDIOVERSION N/A 12/16/2019   Procedure: TRANSESOPHAGEAL ECHOCARDIOGRAM (TEE);  Surgeon: Lajuana Matte, MD;  Location: [place];  Service: Open Heart Surgery;  Laterality: N/A;  . WRIST SURGERY     left    Current Medications: Current Meds  Medication Sig  . cetirizine (ZYRTEC) 10 MG tablet TAKE 1 TABLET BY MOUTH EVERY DAY  . fluticasone (FLONASE) 50 MCG/ACT nasal spray SPRAY 2 SPRAYS INTO EACH NOSTRIL EVERY DAY  . guaiFENesin (MUCINEX) 600 MG 12 hr tablet Take 1 tablet (600 mg total) by mouth 2 (two) times daily as needed.  Marland Kitchen losartan (COZAAR) 25 MG tablet Take 0.5 tablets (12.5 mg total) by mouth daily.  . metoprolol tartrate (LOPRESSOR) 25 MG tablet TAKE 1.5 TABLETS (37.5 MG TOTAL) BY MOUTH 2 (TWO) TIMES DAILY.  . montelukast (SINGULAIR) 10 MG tablet TAKE 1 TABLET BY MOUTH EVERYDAY AT BEDTIME  . multivitamin-iron-minerals-folic acid (CENTRUM) chewable tablet Chew 1 tablet by mouth daily.  . nitroGLYCERIN (NITROSTAT) 0.4 MG SL tablet Place 1 tablet (0.4 mg total) under the tongue every 5 (five) minutes as needed for chest pain.  . pantoprazole (PROTONIX) 40 MG tablet Take 1 tablet (40 mg total) by mouth 2 (two) times daily.  Marland Kitchen PROAIR HFA 108 (90 Base) MCG/ACT inhaler INHALE 2 PUFFS BY MOUTH  EVERY 4 HOURS AS NEEDED FOR WHEEZE OR FOR SHORTNESS OF BREATH  . rosuvastatin (CRESTOR) 20 MG tablet TAKE 1 TABLET BY MOUTH EVERY DAY  . traMADol (ULTRAM) 50 MG tablet Take 1 tablet (50 mg total) by mouth every 4 (four) hours as needed for moderate pain.  Marland Kitchen umeclidinium-vilanterol (ANORO ELLIPTA) 62.5-25 MCG/INH AEPB Inhale 1 puff into the lungs daily.  . [DISCONTINUED] aspirin EC 81 MG tablet Take 81 mg by mouth daily.     Allergies:   Ace inhibitors, Ampicillin, Penicillins, Codeine, and Lisinopril   Social History   Socioeconomic History  . Marital status: Married    Spouse name: Not on file  . Number of children:  Not on file  . Years of education: Not on file  . Highest education level: Not on file  Occupational History  . Not on file  Tobacco Use  . Smoking status: Current Every Day Smoker    Packs/day: 0.50    Years: 51.00    Pack years: 25.50    Types: Cigarettes  . Smokeless tobacco: Former Systems developer    Types: Snuff, Sarina Ser    Quit date: 01/05/1965  Substance and Sexual Activity  . Alcohol use: Yes    Alcohol/week: 49.0 standard drinks    Types: 49 Cans of beer per week    Comment: drinks about a 6-pack/day  . Drug use: No  . Sexual activity: Yes    Partners: Female  Other Topics Concern  . Not on file  Social History Narrative  . Not on file   Social Determinants of Health   Financial Resource Strain:   . Difficulty of Paying Living Expenses:   Food Insecurity:   . Worried About Charity fundraiser in the Last Year:   . Arboriculturist in the Last Year:   Transportation Needs:   . Film/video editor (Medical):   Marland Kitchen Lack of Transportation (Non-Medical):   Physical Activity:   . Days of Exercise per Week:   . Minutes of Exercise per Session:   Stress:   . Feeling of Stress :   Social Connections:   . Frequency of Communication with Friends and Family:   . Frequency of Social Gatherings with Friends and Family:   . Attends Religious Services:   . Active  Member of Clubs or Organizations:   . Attends Archivist Meetings:   Marland Kitchen Marital Status:      Family History: The patient's family history includes Emphysema in his father.  ROS:   Please see the history of present illness.     All other systems reviewed and are negative.  EKGs/Labs/Other Studies Reviewed:    The following studies were reviewed today:  Cath 12/12/2019  Dist LM to Prox LAD lesion is 80% stenosed.  Mid LM to Dist LM lesion is 50% stenosed.  Ost 2nd Diag lesion is 100% stenosed.  Ost Cx to Prox Cx lesion is 85% stenosed.  Prox RCA lesion is 60% stenosed.  Mid RCA-1 lesion is 99% stenosed.  Mid RCA-2 lesion is 50% stenosed.  The left ventricular systolic function is normal.  LV end diastolic pressure is mildly elevated.  The left ventricular ejection fraction is 50-55% by visual estimate.  Ost LM lesion is 40% stenosed.  Prox Cx to Mid Cx lesion is 95% stenosed.  Ost 1st Diag lesion is 60% stenosed.   1.  Severe heavily calcified three-vessel coronary artery disease. 2.  Normal LV systolic function and mildly elevated left ventricular end-diastolic pressure.  Recommendations: The coronary arteries are not suitable for PCI due to heavy diffuse calcifications.  Recommend evaluation for CABG especially that the patient has right lung mass which will likely require resection at the same time.  The patient is also a poor candidate for dual antiplatelet therapy.  There are good distal targets for CABG. Also recommend oncology consultation to ensure he does not have metastatic disease before committing to CABG.   Echo 12/12/2019 IMPRESSIONS  1. Left ventricular ejection fraction, by estimation, is 50 to 55%. The  left ventricle has low normal function. The left ventricle demonstrates  regional wall motion abnormalities (see scoring diagram/findings for  description). Left ventricular diastolic  parameters are indeterminate.  2. Right  ventricular systolic function is mildly reduced. The right  ventricular size is normal. There is normal pulmonary artery systolic  pressure. The estimated right ventricular systolic pressure is 29.5 mmHg.  3. The mitral valve is grossly normal. Mild mitral valve regurgitation.  4. The aortic valve is tricuspid. There is moderate, nodular  calcification of the left coronary cusp as well as moderate annular  calcification. Aortic valve regurgitation is not visualized.  5. The inferior vena cava is normal in size with greater than 50%  respiratory variability, suggesting right atrial pressure of 3 mmHg.     EKG:  EKG is ordered today.  The ekg ordered today demonstrates normal sinus rhythm, heart rate is 97, no significant ST-T wave changes.  Recent Labs: 12/15/2019: ALT 24 12/17/2019: Magnesium 2.1 12/22/2019: BUN 9; Creatinine, Ser 0.82; Hemoglobin 7.9; Platelets 322; Potassium 3.8; Sodium 138  Recent Lipid Panel    Component Value Date/Time   CHOL 168 12/15/2019 0315   TRIG 28 12/15/2019 0315   HDL 75 12/15/2019 0315   CHOLHDL 2.2 12/15/2019 0315   VLDL 6 12/15/2019 0315   LDLCALC 87 12/15/2019 0315   LDLDIRECT 105 (H) 05/16/2017 1528   LDLDIRECT 150 (H) 02/13/2013 1502    Physical Exam:    VS:  BP (!) 100/40   Pulse 97   Ht 5\' 11"  (1.803 m)   Wt 137 lb (62.1 kg)   BMI 19.11 kg/m     Wt Readings from Last 3 Encounters:  01/02/20 137 lb (62.1 kg)  12/27/19 141 lb (64 kg)  12/23/19 142 lb 6.7 oz (64.6 kg)     GEN:  Well nourished, well developed in no acute distress HEENT: Normal NECK: No JVD; No carotid bruits LYMPHATICS: No lymphadenopathy CARDIAC: RRR, no murmurs, rubs, gallops.  Sternotomy scar well-healed RESPIRATORY:  Clear to auscultation without rales, wheezing or rhonchi  ABDOMEN: Soft, non-tender, non-distended MUSCULOSKELETAL:  No edema; No deformity  SKIN: Warm and dry NEUROLOGIC:  Alert and oriented x 3 PSYCHIATRIC:  Normal affect   ASSESSMENT:     1. Coronary artery disease involving coronary bypass graft of native heart without angina pectoris   2. Essential hypertension   3. Hyperlipidemia LDL goal <70   4. Chronic obstructive pulmonary disease, unspecified COPD type ([place])   5. Alcohol use disorder, severe, dependence ([place])   6. Prediabetes   7. Nodule of upper lobe of right lung   8. Tobacco abuse    PLAN:    In order of problems listed above:  1. CAD s/p CABG: Sternotomy scar is well-healed without any bleeding, drainage or sign of infection.  Continue on high-dose aspirin  2. Hypertension: Blood pressure borderline today.  Patient is asymptomatic.  Continue on current therapy  3. Hyperlipidemia: On Crestor  4. COPD: No exacerbation  5. Alcohol abuse: Continue to drink excessively  6. Prediabetes: Recent hemoglobin A1c was 6.1.  I advised the patient to limit carbohydrate in diet  7. Right upper lobe lung nodule: Followed by Dr. Kipp Brood.  Plan to repeat a chest x-ray in 1 month  8. Tobacco abuse: Fortunately he has quit smoking the day he was discharged after bypass surgery.   Medication Adjustments/Labs and Tests Ordered: Current medicines are reviewed at length with the patient today.  Concerns regarding medicines are outlined above.  Orders Placed This Encounter  Procedures  . EKG 12-Lead   Meds ordered this encounter  Medications  . aspirin EC 325 MG tablet  Sig: Take 1 tablet (325 mg total) by mouth daily.    Dispense:  90 tablet    Refill:  3    Patient Instructions  Medication Instructions:   RESTART Aspirin 325 mg daily *If you need a refill on your cardiac medications before your next appointment, please call your pharmacy*  Lab Work: NONE ordered at this time of appointment   If you have labs (blood work) drawn today and your tests are completely normal, you will receive your results only by: Marland Kitchen MyChart Message (if you have MyChart) OR . A paper copy in the mail If you have any lab  test that is abnormal or we need to change your treatment, we will call you to review the results.  Testing/Procedures: NONE ordered at this time of appointment   Follow-Up: At [facility], you and your health needs are our priority.  As part of our continuing mission to provide you with exceptional heart care, we have created designated Provider Care Teams.  These Care Teams include your primary Cardiologist (physician) and Advanced Practice Providers (APPs -  Physician Assistants and Nurse Practitioners) who all work together to provide you with the care you need, when you need it.  We recommend signing up for the patient portal called "MyChart".  Sign up information is provided on this After Visit Summary.  MyChart is used to connect with patients for Virtual Visits (Telemedicine).  Patients are able to view lab/test results, encounter notes, upcoming appointments, etc.  Non-urgent messages can be sent to your provider as well.   To learn more about what you can do with MyChart, go to [email or web address].    Your next appointment:   1 month(s) with Almyra Deforest, PA-C 3 month(s) with Peter Martinique, MD  The format for your next appointment:   In Person  Provider:   Peter Martinique, MD or Almyra Deforest, PA-C  Other Instructions  BRING all of your medication bottles with you to your next appointment     Signed, Almyra Deforest, [place]  01/04/2020 11:35 PM    [place]

## 2020-01-03 ENCOUNTER — Ambulatory Visit: Payer: Medicare Other

## 2020-01-03 ENCOUNTER — Telehealth: Payer: Self-pay

## 2020-01-03 NOTE — Telephone Encounter (Signed)
Received phone call from Judson Roch at [facility] regarding patient. Message stated that patient has been having abdominal pain and pain with deep breaths.   Called patient to follow up. Patient reports that pain started yesterday and was between 4-5/10. No N, V, or diarrhea. No fever. Patient says that he is not currently having any pain. Offered to make patient virtual appointment, patient declined.   ED precautions given. Advised patient to follow up with office on Monday if still experiencing symptoms.   To PCP  Talbot Grumbling, RN

## 2020-01-04 ENCOUNTER — Encounter: Payer: Self-pay | Admitting: Physician Assistant

## 2020-01-09 ENCOUNTER — Ambulatory Visit: Payer: Medicare Other | Attending: Family

## 2020-01-09 DIAGNOSIS — Z23 Encounter for immunization: Secondary | ICD-10-CM

## 2020-01-09 DIAGNOSIS — Z8616 Personal history of COVID-19: Secondary | ICD-10-CM | POA: Diagnosis not present

## 2020-01-09 DIAGNOSIS — R918 Other nonspecific abnormal finding of lung field: Secondary | ICD-10-CM | POA: Diagnosis not present

## 2020-01-09 DIAGNOSIS — Z7982 Long term (current) use of aspirin: Secondary | ICD-10-CM | POA: Diagnosis not present

## 2020-01-09 DIAGNOSIS — Z720 Tobacco use: Secondary | ICD-10-CM | POA: Diagnosis not present

## 2020-01-09 DIAGNOSIS — Z951 Presence of aortocoronary bypass graft: Secondary | ICD-10-CM | POA: Diagnosis not present

## 2020-01-09 DIAGNOSIS — Z9981 Dependence on supplemental oxygen: Secondary | ICD-10-CM | POA: Diagnosis not present

## 2020-01-09 DIAGNOSIS — I214 Non-ST elevation (NSTEMI) myocardial infarction: Secondary | ICD-10-CM | POA: Diagnosis not present

## 2020-01-09 DIAGNOSIS — I10 Essential (primary) hypertension: Secondary | ICD-10-CM | POA: Diagnosis not present

## 2020-01-09 DIAGNOSIS — J449 Chronic obstructive pulmonary disease, unspecified: Secondary | ICD-10-CM | POA: Diagnosis not present

## 2020-01-09 DIAGNOSIS — Z48812 Encounter for surgical aftercare following surgery on the circulatory system: Secondary | ICD-10-CM | POA: Diagnosis not present

## 2020-01-09 DIAGNOSIS — E785 Hyperlipidemia, unspecified: Secondary | ICD-10-CM | POA: Diagnosis not present

## 2020-01-09 DIAGNOSIS — I25110 Atherosclerotic heart disease of native coronary artery with unstable angina pectoris: Secondary | ICD-10-CM | POA: Diagnosis not present

## 2020-01-09 NOTE — Patient Instructions (Signed)
Visit Information  Goals Addressed            This Visit's Progress   . I dont have my medication. (pt-stated)       CARE PLAN ENTRY (see longtitudinal plan of care for additional care plan information)  .  Inability to get medication from pharmacy  Current Barriers:  . Difficulty obtaining medications  Nurse Case Manager Clinical Goal(s):  Marland Kitchen Over the next 14 days, patient will verbalize understanding of plan for obtaining medications  Interventions:  . Evaluation of current treatment plan  and patient's adherence to plan as established by provider. . Provided education to patient re: COPD  . Reviewed medications with patient and discussed medications he did not have  . Discussed plans with patient for ongoing care management follow up and provided patient with direct contact information for care management team . Reviewed scheduled/upcoming provider appointments, and transportation  . The patient and I discussed and reviewed his medications.  Two of his medications he did not have his pro air inhaler and his Crestor 20 mg.  I called his pharmacy [facility] on [address] and asked them to send in refill request.  I spoke with Shiny.   01/03/20 . Called the patient back to followup to see if he had his medication and he had received them . He stated that he still gets shob with exertion, has little energy continues on 3 litter of O2 . He states that she ahas a little appetite and sleeps "ok"  Patient Self Care Activities:  . Patient verbalizes understanding of plan Self administers medications as prescribed . Attends all scheduled provider appointments . Calls provider office for new concerns or questions Please see past updates related to this goal by clicking on the "Past Updates" button in the selected goal         Mr. Kracht was given information about Care Management services today including:  1. Care Management services include personalized support from  designated clinical staff supervised by his physician, including individualized plan of care and coordination with other care providers 2. 24/7 contact phone numbers for assistance for urgent and routine care needs. 3. The patient may stop CCM services at any time (effective at the end of the month) by phone call to the office staff.  Patient agreed to services and verbal consent obtained.   The patient verbalized understanding of instructions provided today and declined a print copy of patient instruction materials.   The care management team will reach out to the patient again over the next 14 days.  The patient has been provided with contact information for the care management team and has been advised to call with any health related questions or concerns.   Lazaro Arms RN, BSN, [facility] Care Management Coordinator [place] Phone: [phone] Fax: [phone]

## 2020-01-09 NOTE — Chronic Care Management (AMB) (Signed)
  Care Management   Follow Up Note   01/09/2020 Name: Lucas [name] MRN: [id number] DOB: 03/20/1951  Referred by: [name], DO Reason for referral : Care Coordination (Cre Management RNCM Medications)   Lucas [name] is a 69 y.o. year old male who is a primary care patient of [name], DO. The care management team was consulted for assistance with care management and care coordination needs.    Review of patient status, including review of consultants reports, relevant laboratory and other test results, and collaboration with appropriate care team members and the patient's provider was performed as part of comprehensive patient evaluation and provision of chronic care management services.    SDOH (Social Determinants of Health) assessments performed: No See Care Plan activities for detailed interventions related to [facility])     Advanced Directives: See Care Plan and Vynca application for related entries.   Goals Addressed            This Visit's Progress   . I dont have my medication. (pt-stated)       CARE PLAN ENTRY (see longtitudinal plan of care for additional care plan information)  .  Inability to get medication from pharmacy  Current Barriers:  . Difficulty obtaining medications  Nurse Case Manager Clinical Goal(s):  Marland Kitchen Over the next 14 days, patient will verbalize understanding of plan for obtaining medications  Interventions:  . Evaluation of current treatment plan  and patient's adherence to plan as established by provider. . Provided education to patient re: COPD  . Reviewed medications with patient and discussed medications he did not have  . Discussed plans with patient for ongoing care management follow up and provided patient with direct contact information for care management team . Reviewed scheduled/upcoming provider appointments, and transportation  . The patient and I discussed and reviewed his medications.  Two of his medications he did not have  his pro air inhaler and his Crestor 20 mg.  I called [facility] pharmacy [facility] on [address] and asked them to send in refill request.  I spoke with Shiny.   01/03/20 . Called the patient back to followup to see if he had his medication and he had received them . He stated that he still gets shob with exertion, has little energy continues on 3 litter of O2 . He states that she ahas a little appetite and sleeps "ok"  Patient Self Care Activities:  . Patient verbalizes understanding of plan Self administers medications as prescribed . Attends all scheduled provider appointments . Calls provider office for new concerns or questions Please see past updates related to this goal by clicking on the "Past Updates" button in the selected goal          The care management team will reach out to the patient again over the next 14 days.  The patient has been provided with contact information for the care management team and has been advised to call with any health related questions or concerns.   Lazaro Arms RN, BSN, [facility] Care Management Coordinator [place] Phone: [phone] Fax: [phone]

## 2020-01-14 NOTE — Progress Notes (Signed)
   Covid-19 Vaccination Clinic  Name:  Lucas [name]    MRN: [id number] DOB: Oct 27, 1950  01/14/2020  Lucas [name] was observed post Covid-19 immunization for 15 minutes without incident. He was provided with Vaccine Information Sheet and instruction to access the V-Safe system.   Lucas [name] was instructed to call 911 with any severe reactions post vaccine: Marland Kitchen Difficulty breathing  . Swelling of face and throat  . A fast heartbeat  . A bad rash all over body  . Dizziness and weakness   Immunizations Administered    Name Date Dose VIS Date Route   Moderna COVID-19 Vaccine 01/09/2020  5:15 PM 0.5 mL 09/24/2019 Intramuscular   Manufacturer: Moderna   Lot: [id number]   [place]: [phone]

## 2020-01-22 ENCOUNTER — Ambulatory Visit: Payer: Medicare Other

## 2020-01-22 ENCOUNTER — Other Ambulatory Visit: Payer: Self-pay

## 2020-01-22 NOTE — Patient Instructions (Addendum)
Visit Information  Goals Addressed            This Visit's Progress   . COMPLETED: I dont have my medication. (pt-stated)       CARE PLAN ENTRY (see longtitudinal plan of care for additional care plan information)  .  Inability to get medication from pharmacy  Current Barriers:  . Difficulty obtaining medications  Nurse Case Manager Clinical Goal(s):  Marland Kitchen Over the next 14 days, patient will verbalize understanding of plan for obtaining medications  Interventions:  . Evaluation of current treatment plan  and patient's adherence to plan as established by provider. . Provided education to patient re: COPD  . Reviewed medications with patient and discussed medications he did not have  . Discussed plans with patient for ongoing care management follow up and provided patient with direct contact information for care management team . Reviewed scheduled/upcoming provider appointments, and transportation  . The patient and I discussed and reviewed his medications.  Two of his medications he did not have his pro air inhaler and his Crestor 20 mg.  [facility] on [address] and asked them to send in refill request.  I spoke with Shiny.   01/03/20 . Called the patient back to followup to see if he had his medication and he had received them . He stated that he still gets shob with exertion, has little energy continues on 3 litter of O2 . He states that she ahas a little appetite and sleeps "ok" . 01/22/20 . Spoke with the patient and he is doing fine . He states that he has been calling the office and been unable to get throught to get an appointment.  He needs to get a physical.  He states that his PCP has changed and not sure who his new PCp will be.  RN Case Manager pu the patient on hold and messaged the front and scheduled the patient an appointment with Dr Susa Simmonds on 02/07/20 at 145 pm told him to be 15 min eal=rly for check in.  He appreciated the  appointment. . The patient states that his breathing is doing well this morning,  he is on 3 liters,  he does not have to use his oxygen all the time but most of the time he does.  He states that he gets short winded with exertion. If he walks from his living room to the bathroom he gets winded. Marland Kitchen He states that his appetiti is still not that good,  he just does not want to eat at time.  He is staing hydrated but does not drink water.  He drink coffee, tea, sodas, .  Advised the patient to drink water.  Suggested that he can add flovers to his water or fruit to infuse it and make it tase better.   He state that he eats a lot of ice.   Marland Kitchen He states that he is contipated but he takes a laxative .  He takes all of hi medications as prescribed. . Reviewed the COPD action plan with the patient  . Reviewed scheduled appointments: 01/31/20 Dr Kipp Brood, 02/04/20 Dr Eulas Post,  02/07/20 Dr Susa Simmonds. . Call care management team with any question or concerns . Patient is able to obtain all of his medications   Patient Self Care Activities:  . Patient verbalizes understanding of plan Self administers medications as prescribed . Attends all scheduled provider appointments . Calls provider office for new concerns or questions Please see  past updates related to this goal by clicking on the "Past Updates" button in the selected goal        Lucas [name] Thank you for allowing me to serve you.  The patient verbalized understanding of instructions provided today and declined a print copy of patient instruction materials.   The care management team will reach out to the patient again over the next 90 days.  The patient has been provided with contact information for the care management team and has been advised to call with any health related questions or concerns.   Lazaro Arms RN, BSN, [facility] Care Management Coordinator [place] Phone: [phone] Fax: [phone]   COPD Action Plan A COPD  action plan is a description of what to do when you have a flare (exacerbation) of chronic obstructive pulmonary disease (COPD). Your action plan is a color-coded plan that lists the symptoms that indicate whether or not your condition is under control and what actions to take.  If you have symptoms in the green zone, it means you are doing well that day.  If you have symptoms in the yellow zone, it means you are having a bad day or an exacerbation.  If you have symptoms in the red zone, you need urgent medical care. Follow the plan you and your health care provider developed. Review your plan with your health care provider at each visit. Red zone Symptoms in this zone mean that you should get medical help right away. They include:  Feeling very short of breath, even when you are resting.  Not being able to do any activities because of poor breathing.  Not being able to sleep because of poor breathing.  Fever or shaking chills.  Feeling confused or very sleepy.  Chest pain.  Coughing up blood. If you have any of these symptoms, call emergency services (911 in the [place] or go to the nearest emergency room. Yellow zone Symptoms in this zone mean that your condition may be getting worse. They include:  Feeling more short of breath than usual.  Having less energy for daily activities than usual.  Phlegm or mucus that is thicker than usual.  Needing to use your rescue inhaler or nebulizer more often than usual.  More ankle swelling than usual.  Coughing more than usual.  Feeling like you have a chest cold.  Trouble sleeping due to COPD symptoms.  Decreased appetite.  COPD medicines not helping as much as usual. If you experience any "yellow" symptoms:  Keep taking your daily medicines as directed.  Use your quick-relief inhaler as told by your health care provider.  If you were prescribed steroid medicine to take by mouth (oral medicine), start taking it as told by  your health care provider.  If you were prescribed an antibiotic, start taking it as told by your health care provider. Do not stop taking the antibiotic even if you start to feel better.  Use oxygen as told by your health care provider.  Get more rest.  Do your pursed-lip breathing exercises.  Do not smoke. Avoid any irritants in the air. If your signs and symptoms do not improve after taking these steps, call your health care provider right away. Green zone Symptoms in this zone mean that you are doing well. They include:  Being able to do your usual activities and exercise.  Having the usual amount of coughing, including the same amount of phlegm or mucus.  Being able to sleep well.  Having a good appetite. Follow these instructions at home:  Continue taking your daily medicines as told by your health care provider.  Make sure you receive all the immunizations that your health care provider recommends, especially the pneumococcal and influenza vaccines.  Wash your hands often with soap and water. Have family members wash their hands too. Regular hand washing can help prevent infections.  Follow your usual exercise and diet plan.  Avoid irritants in the air, such as smoke.  Do not use any products that contain nicotine or tobacco, such as cigarettes and e-cigarettes. If you need help quitting, ask your health care provider. Where to find more information: You can find more information about COPD from:  American Lung Association, My COPD Action Plan: SlotDealers.si.pdf  COPD Foundation: www.copdfoundation.[place]: http://cline.com/ This information is not intended to replace advice given to you by your health care provider. Make sure you discuss any questions you have with your health care provider. Document Revised: 02/14/2018 Document Reviewed: 02/22/2017 Elsevier  Patient Education  [place].

## 2020-01-22 NOTE — Chronic Care Management (AMB) (Signed)
Care Management   Follow Up Note   01/22/2020 Name: Lucas [name] MRN: [id number] DOB: 1951/10/16  Referred by: Lucas [name] Reason for referral : Care Coordination (Care Management 2 week F/U)   Lucas [name] is a 69 y.o. year old male who is a primary care patient of Lucas [name]. The care management team was consulted for assistance with care management and care coordination needs.    Review of patient status, including review of consultants reports, relevant laboratory and other test results, and collaboration with appropriate care team members and the patient's provider was performed as part of comprehensive patient evaluation and provision of chronic care management services.    SDOH (Social Determinants of Health) assessments performed: No See Care Plan activities for detailed interventions related to [facility] [place])     Advanced Directives: See Care Plan and Vynca application for related entries.   Goals Addressed            This Visit's Progress   . COMPLETED: I dont have my medication. (pt-stated)       CARE PLAN ENTRY (see longtitudinal plan of care for additional care plan information)  .  Inability to get medication from pharmacy  Current Barriers:  . Difficulty obtaining medications  Nurse Case Manager Clinical Goal(s):  Lucas [name] Over the next 14 days, patient will verbalize understanding of plan for obtaining medications  Interventions:  . Evaluation of current treatment plan  and patient's adherence to plan as established by provider. . Provided education to patient re: COPD  . Reviewed medications with patient and discussed medications he did not have  . Discussed plans with patient for ongoing care management follow up and provided patient with direct contact information for care management team . Reviewed scheduled/upcoming provider appointments, and transportation  . The patient and I discussed and reviewed his medications.  Two of his medications he did  not have his pro air inhaler and his Crestor 20 mg.  [facility] on [name] and asked them to send in refill request.  I spoke with Lucas [name].   01/03/20 . Called the patient back to followup to see if he had his medication and he had received them . He stated that he still gets shob with exertion, has little energy continues on 3 litter of O2 . He states that she ahas a little appetite and sleeps "ok" . 01/22/20 . Spoke with the patient and he is doing fine . He states that he has been calling the office and been unable to get throught to get an appointment.  He needs to get a physical.  He states that his PCP has changed and not sure who his new PCp will be.  RN Case Manager pu the patient on hold and messaged the front and scheduled the patient an appointment with Dr Lucas [name] on 02/07/20 at 145 pm told him to be 15 min eal=rly for check in.  He appreciated the appointment. . The patient states that his breathing is doing well this morning,  he is on 3 liters,  he does not have to use his oxygen all the time but most of the time he does.  He states that he gets short winded with exertion. If he walks from his living room to the bathroom he gets winded. Lucas [name] He states that his appetiti is still not that good,  he just does not want to eat at time.  He is staing hydrated but does  not drink water.  He drink coffee, tea, sodas, .  Advised the patient to drink water.  Suggested that he can add flovers to his water or fruit to infuse it and make it tase better.   He state that he eats a lot of ice.   Lucas [name] He states that he is contipated but he takes a laxative .  He takes all of hi medications as prescribed. . Reviewed the COPD action plan with the patient  . Reviewed scheduled appointments: 01/31/20 Dr Lucas [name], 02/04/20 Dr Lucas [name],  02/07/20 Dr Lucas [name]. . Call care management team with any question or concerns . Patient is able to obtain all of his medications   Patient Self Care  Activities:  . Patient verbalizes understanding of plan Self administers medications as prescribed . Attends all scheduled provider appointments . Calls provider office for new concerns or questions Please see past updates related to this goal by clicking on the "Past Updates" button in the selected goal         Lucas [name] Thank you for allowing me to serve you. The care management team will reach out to the patient again over the next 90 days.  The patient has been provided with contact information for the care management team and has been advised to call with any health related questions or concerns.   Lucas Arms RN, BSN, Lucas [name] Care Management Coordinator [place] Phone: [phone] Fax: [phone]

## 2020-01-24 DIAGNOSIS — J449 Chronic obstructive pulmonary disease, unspecified: Secondary | ICD-10-CM | POA: Diagnosis not present

## 2020-01-24 DIAGNOSIS — I2510 Atherosclerotic heart disease of native coronary artery without angina pectoris: Secondary | ICD-10-CM | POA: Diagnosis not present

## 2020-01-24 DIAGNOSIS — J90 Pleural effusion, not elsewhere classified: Secondary | ICD-10-CM | POA: Diagnosis not present

## 2020-01-30 ENCOUNTER — Other Ambulatory Visit: Payer: Self-pay | Admitting: Thoracic Surgery (Cardiothoracic Vascular Surgery)

## 2020-01-30 DIAGNOSIS — Z951 Presence of aortocoronary bypass graft: Secondary | ICD-10-CM

## 2020-01-31 ENCOUNTER — Encounter: Payer: Self-pay | Admitting: Thoracic Surgery (Cardiothoracic Vascular Surgery)

## 2020-01-31 ENCOUNTER — Other Ambulatory Visit: Payer: Self-pay

## 2020-01-31 ENCOUNTER — Ambulatory Visit
Admission: RE | Admit: 2020-01-31 | Discharge: 2020-01-31 | Disposition: A | Discharge status: Home / Self Care | Payer: Medicare Other | Source: Ambulatory Visit | Attending: Thoracic Surgery (Cardiothoracic Vascular Surgery) | Admitting: Thoracic Surgery (Cardiothoracic Vascular Surgery)

## 2020-01-31 ENCOUNTER — Ambulatory Visit (INDEPENDENT_AMBULATORY_CARE_PROVIDER_SITE_OTHER): Payer: Self-pay | Admitting: Thoracic Surgery (Cardiothoracic Vascular Surgery)

## 2020-01-31 VITALS — BP 130/74 | HR 113 | Temp 97.9°F | Resp 24 | Ht 71.0 in | Wt 146.0 lb

## 2020-01-31 DIAGNOSIS — Z951 Presence of aortocoronary bypass graft: Secondary | ICD-10-CM

## 2020-01-31 DIAGNOSIS — J439 Emphysema, unspecified: Secondary | ICD-10-CM | POA: Diagnosis not present

## 2020-01-31 DIAGNOSIS — R911 Solitary pulmonary nodule: Secondary | ICD-10-CM | POA: Diagnosis not present

## 2020-01-31 DIAGNOSIS — I2510 Atherosclerotic heart disease of native coronary artery without angina pectoris: Secondary | ICD-10-CM

## 2020-01-31 DIAGNOSIS — R6889 Other general symptoms and signs: Secondary | ICD-10-CM | POA: Diagnosis not present

## 2020-01-31 NOTE — Progress Notes (Signed)
      [address]       [place] [address]             [phone]        Jul[facility] E [facility] Medical Record #[id number] Date of Birth: 01/16/1951  Referring: Martinique, Peter M, MD Primary Care: [name], DO Primary Cardiologist:Peter Martinique, MD  Reason for visit:   follow-up  History of Present Illness:     Mr. Balling comes in for his second follow-up appointment.  He underwent CABG times done well.  He also has a right upper lobe nodule, and given his smoking history and is very concerning for primary lung cancer.  He continues to smoke about a pack a day.  From a respiratory standpoint he is not on any oxygen today but his sats are 90%.  He does admit to some exertional dyspnea occasionally with ambulation.  Physical Exam: BP 130/74 (BP Location: Right Arm, Patient Position: Sitting, Cuff Size: Normal)   Pulse (!) 113 Comment: regular  Temp 97.9 F (36.6 C) (Temporal)   Resp (!) 24   Ht 5\' 11"  (1.803 m)   Wt 146 lb (66.2 kg)   SpO2 90% Comment: RA  BMI 20.36 kg/m   Alert NAD Incision well-healed.  Sternum stable Abdomen soft, ND no peripheral edema   Diagnostic Studies & Laboratory data: CXR: Clear     Assessment / Plan:   69 year old male status post CABG with right upper lobe pulmonary nodule.  I will refer him to Dr. Valeta Harms for evaluation of navigational bronchoscopy with biopsy.  Given his smoking he likely would not be a surgical candidate unless he stops.   Lajuana Matte 01/31/2020 11:26 AM

## 2020-02-04 ENCOUNTER — Other Ambulatory Visit: Payer: Self-pay

## 2020-02-04 ENCOUNTER — Encounter: Payer: Self-pay | Admitting: Physician Assistant

## 2020-02-04 ENCOUNTER — Ambulatory Visit (INDEPENDENT_AMBULATORY_CARE_PROVIDER_SITE_OTHER): Payer: Medicare Other | Admitting: Physician Assistant

## 2020-02-04 VITALS — BP 134/76 | HR 86 | Temp 97.0°F | Ht 71.0 in | Wt 131.2 lb

## 2020-02-04 DIAGNOSIS — Z720 Tobacco use: Secondary | ICD-10-CM | POA: Diagnosis not present

## 2020-02-04 DIAGNOSIS — I25810 Atherosclerosis of coronary artery bypass graft(s) without angina pectoris: Secondary | ICD-10-CM

## 2020-02-04 DIAGNOSIS — I10 Essential (primary) hypertension: Secondary | ICD-10-CM | POA: Diagnosis not present

## 2020-02-04 DIAGNOSIS — R7303 Prediabetes: Secondary | ICD-10-CM

## 2020-02-04 DIAGNOSIS — J449 Chronic obstructive pulmonary disease, unspecified: Secondary | ICD-10-CM | POA: Diagnosis not present

## 2020-02-04 DIAGNOSIS — E785 Hyperlipidemia, unspecified: Secondary | ICD-10-CM

## 2020-02-04 DIAGNOSIS — R6889 Other general symptoms and signs: Secondary | ICD-10-CM | POA: Diagnosis not present

## 2020-02-04 DIAGNOSIS — R911 Solitary pulmonary nodule: Secondary | ICD-10-CM

## 2020-02-04 MED ORDER — ASPIRIN EC 325 MG PO TBEC: 325.0000 mg | DELAYED_RELEASE_TABLET | Freq: Every day | ORAL | 3 refills | Status: DC

## 2020-02-04 NOTE — Patient Instructions (Signed)
Medication Instructions:   RESUME taking Aspirin 325 mg daily, can take 4 tablets of 81 mg daily  TAKE 1.5 (37.tablets of Metoprolol Tartrate 2 times a day  *If you need a refill on your cardiac medications before your next appointment, please call your pharmacy*  Lab Work: NONE ordered at this time of appointment   If you have labs (blood work) drawn today and your tests are completely normal, you will receive your results only by: Marland Kitchen MyChart Message (if you have MyChart) OR . A paper copy in the mail If you have any lab test that is abnormal or we need to change your treatment, we will call you to review the results.  Testing/Procedures: NONE ordered at this time of appointment   Follow-Up: At [facility], you and your health needs are our priority.  As part of our continuing mission to provide you with exceptional heart care, we have created designated Provider Care Teams.  These Care Teams include your primary Cardiologist (physician) and Advanced Practice Providers (APPs -  Physician Assistants and Nurse Practitioners) who all work together to provide you with the care you need, when you need it.  We recommend signing up for the patient portal called "MyChart".  Sign up information is provided on this After Visit Summary.  MyChart is used to connect with patients for Virtual Visits (Telemedicine).  Patients are able to view lab/test results, encounter notes, upcoming appointments, etc.  Non-urgent messages can be sent to your provider as well.   To learn more about what you can do with MyChart, go to [email or web address].    Your next appointment:   FOLLOW UP AS SCHEDULED   The format for your next appointment:   In Person  Provider:   Peter Martinique, MD  Other Instructions

## 2020-02-04 NOTE — Progress Notes (Signed)
Cardiology Office Note:    Date:  02/06/2020   ID:  Lucas [name], DOB April 07, 1951, MRN [id number]  PCP:  [name], DO  Cardiologist:  Peter Martinique, MD  Electrophysiologist:  None   Referring MD: [name], DO   Chief Complaint  Patient presents with  . Follow-up    seen for Dr. Martinique    History of Present Illness:    Lucas [name] is a 69 y.o. male with a hx of HTN, HLD, EtOH abuse, tobacco abuse, COPD and CAD.  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  He later presented to [facility] on 10/24/2017 with chest pain.  Cardiac catheterization at the time revealed 35% proximal RCA, 85% and 50% sequential lesion in mid RCA, 70% ostial left circumflex, 65% left main/proximal LAD, occluded D2.  The case was discussed with Dr. Martinique, the LAD and left circumflex lesion was not amenable to PCI and were not critical.  The mid RCA lesion was more severe however would require atherectomy and stenting.  Given ongoing alcohol abuse, Mallory-Weiss tear and medication noncompliance, it was decided to manage medically.  Patient had COVID-19 infection in October 2020.    More recently, patient was admitted to the hospital on 12/12/2019 with chest pain. He was ruled in for NSTEMI by enzymes.  Cardiac catheterization showed severe calcified three-vessel disease with 80% distal left main to proximal LAD, 100% occluded ostial D2, 85% ostial left circumflex lesion, 60% proximal RCA, 99% mid RCA, 95% proximal to mid left circumflex lesion, 60% ostial D1, EF 50 to 55%.  Echocardiogram obtained on the same day showed EF 50 to 55%, mild MR.  Patient eventually underwent CABG x4 by Dr. Kipp Brood on 12/16/2019 with LIMA-LAD, SVG-PDA, SVG-D1, and SVG-OM2.  Hemoglobin A1c prior to the surgery was 6.1 which placed the patient in the prediabetes range.  Postprocedure, Lopressor was increased to 37.5 mg twice daily.  Noted during the hospitalization,  patient had CT angiogram of the chest that demonstrated new 11 mm spiculated nodule in the right upper lobe.  Given the history of smoking, this likely represent primary bronchogenic carcinoma. Dr. Kipp Brood plans to reassess his pulmonary function in 1 month before arranging the CT-guided biopsy of the lung nodule.    I last saw the patient on 01/02/2020, he was still on oxygen.  Instead of the 325 mg aspirin, he was actually just taking 81 mg aspirin.  His blood pressure was borderline low at the time.  I asked him to bring all of his medication bottles with him to today's follow-up. Patient presents today for follow-up along with his wife. His wife states instead of 37.5 mg twice a day of metoprolol, he has only been taking 25 mg twice a day. I urged him to increase his the metoprolol back up to 37.5 twice a day. I think he also forgot my previous instruction to increase the aspirin to 325 mg daily, I specified it again today. Unfortunately after successfully quit smoking for 3 weeks, he has went back to smoking. I strongly urged him to consider complete tobacco cessation. Otherwise he denies any chest pain and he has no lower extremity edema, orthopnea or PND. Overall he is doing well on the current cardiac therapy. He may follow-up with Dr. Martinique in June as previously scheduled.   Past Medical History:  Diagnosis Date  . Acute blood loss anemia 06/21/2018  . Acute upper GI bleed  06/21/2018  . Allergy   . Bilateral AVN of femurs ([place]) 06/25/2018   See 11/2017 Chest CT report  . COPD 12/21/2006  . Environmental allergies   . History of femur fracture 06/25/2018   11/2017 CT AP showed Remote nonunited right femur greater trochanter fracture  . History of Fracture of right tibia 06/26/2018  . History of multiple right thoacic rib fractures 06/25/2018  . History of Right clavicular fracture 06/25/2018  . HYPERLIPIDEMIA 12/21/2006  . HYPERTENSION, BENIGN SYSTEMIC 12/21/2006  . Mallory-Weiss tear   . TOBACCO  DEPENDENCE 12/21/2006    Past Surgical History:  Procedure Laterality Date  . ANKLE SURGERY     left ankle  . APPLICATION OF WOUND VAC  01/06/2012   Procedure: APPLICATION OF WOUND VAC;  Surgeon: Meredith Pel, MD;  Location: [facility];  Service: Orthopedics;  Laterality: Left;  . APPLICATION OF WOUND VAC Right 10/17/2015   Procedure: APPLICATION OF WOUND VAC;  Surgeon: Leandrew Koyanagi, MD;  Location: [place];  Service: Orthopedics;  Laterality: Right;  . BIOPSY  06/22/2018   Procedure: BIOPSY;  Surgeon: [id number] v, DO;  Location: [place] [facility];  Service: Gastroenterology;;  . CORONARY ARTERY BYPASS GRAFT N/A 12/16/2019   Procedure: CORONARY ARTERY BYPASS GRAFTING (CABG) TIMES 4 USING LEFT INTERNAL MAMMARY ARTERY AND ENDOSCOPICALLY HARVESTED RIGHT SAPHENOUS VEIN;  Surgeon: Lajuana Matte, MD;  Location: [place];  Service: Open Heart Surgery;  Laterality: N/A;  . ESOPHAGOGASTRODUODENOSCOPY (EGD) WITH PROPOFOL N/A 06/22/2018   Procedure: ESOPHAGOGASTRODUODENOSCOPY (EGD) WITH PROPOFOL;  Surgeon: [id number] v, DO;  Location: [place] [facility];  Service: Gastroenterology;  Laterality: N/A;  . EXTERNAL FIXATION LEG  01/06/2012   Procedure: EXTERNAL FIXATION LEG;  Surgeon: Meredith Pel, MD;  Location: [facility];  Service: Orthopedics;  Laterality: Left;  Smith-nephew external fixator set  . EXTERNAL FIXATION LEG Right 09/16/2015   Procedure: EXTERNAL FIXATION LEG/LARGE;  Surgeon: Mcarthur Rossetti, MD;  Location: [place];  Service: Orthopedics;  Laterality: Right;  . EXTERNAL FIXATION REMOVAL Right 09/22/2015   Procedure: REMOVAL EXTERNAL FIXATION LEG;  Surgeon: Mcarthur Rossetti, MD;  Location: [place];  Service: Orthopedics;  Laterality: Right;  . FASCIOTOMY  01/10/2012   Procedure: FASCIOTOMY;  Surgeon: Rozanna Box, MD;  Location: [place];  Service: Orthopedics;  Laterality: Left;  status post fasciotomy with wound vac left calf; adjustment external fixator left tibia under fluoro;  retention suture/wound vac placement left lateral calf wound  . FASCIOTOMY  01/06/2012   Procedure: FASCIOTOMY;  Surgeon: Meredith Pel, MD;  [facility]: [facility];  Service: Orthopedics;  Laterality: Left;  Four compartment fasciotomy  . HOT HEMOSTASIS N/A 06/22/2018   Procedure: HEMOSTASIS;  Surgeon: [id number] v, DO;  Location: [facility];  Service: Gastroenterology;  Laterality: N/A;  Epinepherine and clip placed  . I & D EXTREMITY  01/12/2012   Procedure: IRRIGATION AND DEBRIDEMENT EXTREMITY;  Surgeon: Rozanna Box, MD;  Location: [place];  Service: Orthopedics;  Laterality: Left;  LAYERD CLOSURE LEFT LEG WOUND  . I & D EXTREMITY Right 10/17/2015   Procedure: IRRIGATION AND DEBRIDEMENT EXTREMITY;  Surgeon: Leandrew Koyanagi, MD;  Location: [place];  Service: Orthopedics;  Laterality: Right;  . I & D EXTREMITY Right 10/20/2015   Procedure: REPEAT IRRIGATION AND DEBRIDEMENT EXTREMITY, right lower leg;  Surgeon: Mcarthur Rossetti, MD;  Location: [place];  Service: Orthopedics;  Laterality: Right;  . KNEE SURGERY     left knee and right knee  . LEFT HEART CATH AND CORONARY ANGIOGRAPHY  N/A 06/27/2018   Procedure: LEFT HEART CATH AND CORONARY ANGIOGRAPHY;  Surgeon: Martinique, Peter M, MD;  Location: [place] [facility];  Service: Cardiovascular;  Laterality: N/A;  . LEFT HEART CATH AND CORONARY ANGIOGRAPHY N/A 12/12/2019   Procedure: LEFT HEART CATH AND CORONARY ANGIOGRAPHY;  Surgeon: Wellington Hampshire, MD;  Location: [place] [facility];  Service: Cardiovascular;  Laterality: N/A;  . MASS EXCISION Left 09/22/2015   Procedure: EXCISION MASS;  Surgeon: Mcarthur Rossetti, MD;  Location: [place];  Service: Orthopedics;  Laterality: Left;  . ORIF TIBIA PLATEAU  02/07/2012   Procedure: OPEN REDUCTION INTERNAL FIXATION (ORIF) TIBIAL PLATEAU;  Surgeon: Rozanna Box, MD;  Location: [place];  Service: Orthopedics;  Laterality: Left;  ORIF LEFT TIBIAL PLATEAU/ REMOVE EXTERNAL FIXATION LEFT LEG  . ORIF TIBIA  PLATEAU Right 09/22/2015   Procedure: REMOVAL OF EXTERNAL FIXATOR RIGHT LEG, OPEN REDUCTION INTERNAL FIXATION (ORIF) RIGHT TIBIAL PLATEAU, EXCISION SMALL MASS LEFT LEG;  Surgeon: Mcarthur Rossetti, MD;  Location: [place];  Service: Orthopedics;  Laterality: Right;  . SKIN SPLIT GRAFT Right 10/23/2015   Procedure: RIGHT GASTROC FLAP SPLIT THICKNESS SKIN GRAFT FROM RIGHT THING TO RIGHT LEG;  Surgeon: Irene Limbo, MD;  Location: [place];  Service: Plastics;  Laterality: Right;  . TEE WITHOUT CARDIOVERSION N/A 12/16/2019   Procedure: TRANSESOPHAGEAL ECHOCARDIOGRAM (TEE);  Surgeon: Lajuana Matte, MD;  Location: [place];  Service: Open Heart Surgery;  Laterality: N/A;  . WRIST SURGERY     left    Current Medications: Current Meds  Medication Sig  . fluticasone (FLONASE) 50 MCG/ACT nasal spray SPRAY 2 SPRAYS INTO EACH NOSTRIL EVERY DAY  . losartan (COZAAR) 25 MG tablet Take 0.5 tablets (12.5 mg total) by mouth daily.  . metoprolol tartrate (LOPRESSOR) 25 MG tablet TAKE 1.5 TABLETS (37.5 MG TOTAL) BY MOUTH 2 (TWO) TIMES DAILY.  . montelukast (SINGULAIR) 10 MG tablet TAKE 1 TABLET BY MOUTH EVERYDAY AT BEDTIME  . multivitamin-iron-minerals-folic acid (CENTRUM) chewable tablet Chew 1 tablet by mouth daily.  . nitroGLYCERIN (NITROSTAT) 0.4 MG SL tablet Place 1 tablet (0.4 mg total) under the tongue every 5 (five) minutes as needed for chest pain.  Marland Kitchen PROAIR HFA 108 (90 Base) MCG/ACT inhaler INHALE 2 PUFFS BY MOUTH EVERY 4 HOURS AS NEEDED FOR WHEEZE OR FOR SHORTNESS OF BREATH  . rosuvastatin (CRESTOR) 20 MG tablet TAKE 1 TABLET BY MOUTH EVERY DAY  . traMADol (ULTRAM) 50 MG tablet Take 1 tablet (50 mg total) by mouth every 4 (four) hours as needed for moderate pain.  Marland Kitchen umeclidinium-vilanterol (ANORO ELLIPTA) 62.5-25 MCG/INH AEPB Inhale 1 puff into the lungs daily.  . [DISCONTINUED] aspirin EC 81 MG tablet Take 81 mg by mouth daily.     Allergies:   Ace inhibitors, Ampicillin, Penicillins,  Codeine, and Lisinopril   Social History   Socioeconomic History  . Marital status: Married    Spouse name: Not on file  . Number of children: Not on file  . Years of education: Not on file  . Highest education level: Not on file  Occupational History  . Not on file  Tobacco Use  . Smoking status: Current Every Day Smoker    Packs/day: 0.50    Years: 51.00    Pack years: 25.50    Types: Cigarettes  . Smokeless tobacco: Former Systems developer    Types: Snuff, Sarina Ser    Quit date: 01/05/1965  Substance and Sexual Activity  . Alcohol use: Yes  Alcohol/week: 49.0 standard drinks    Types: 49 Cans of beer per week    Comment: drinks about a 6-pack/day  . Drug use: No  . Sexual activity: Yes    Partners: Female  Other Topics Concern  . Not on file  Social History Narrative  . Not on file   Social Determinants of Health   Financial Resource Strain:   . Difficulty of Paying Living Expenses:   Food Insecurity:   . Worried About Charity fundraiser in the Last Year:   . Arboriculturist in the Last Year:   Transportation Needs:   . Film/video editor (Medical):   Marland Kitchen Lack of Transportation (Non-Medical):   Physical Activity:   . Days of Exercise per Week:   . Minutes of Exercise per Session:   Stress:   . Feeling of Stress :   Social Connections:   . Frequency of Communication with Friends and Family:   . Frequency of Social Gatherings with Friends and Family:   . Attends Religious Services:   . Active Member of Clubs or Organizations:   . Attends Archivist Meetings:   Marland Kitchen Marital Status:      Family History: The patient's family history includes Emphysema in his father.  ROS:   Please see the history of present illness.     All other systems reviewed and are negative.  EKGs/Labs/Other Studies Reviewed:    The following studies were reviewed today:  Cath 12/12/2019  Dist LM to Prox LAD lesion is 80% stenosed.  Mid LM to Dist LM lesion is 50%  stenosed.  Ost 2nd Diag lesion is 100% stenosed.  Ost Cx to Prox Cx lesion is 85% stenosed.  Prox RCA lesion is 60% stenosed.  Mid RCA-1 lesion is 99% stenosed.  Mid RCA-2 lesion is 50% stenosed.  The left ventricular systolic function is normal.  LV end diastolic pressure is mildly elevated.  The left ventricular ejection fraction is 50-55% by visual estimate.  Ost LM lesion is 40% stenosed.  Prox Cx to Mid Cx lesion is 95% stenosed.  Ost 1st Diag lesion is 60% stenosed.  1. Severe heavily calcified three-vessel coronary artery disease. 2. Normal LV systolic function and mildly elevated left ventricular end-diastolic pressure.  Recommendations: The coronary arteries are not suitable for PCI due to heavy diffuse calcifications. Recommend evaluation for CABG especially that the patient has right lung mass which will likely require resection at the same time. The patient is also a poor candidate for dual antiplatelet therapy. There are good distal targets for CABG. Also recommend oncology consultation to ensure he does not have metastatic disease before committing to CABG.    Echo 12/12/2019 1. Left ventricular ejection fraction, by estimation, is 50 to 55%. The  left ventricle has low normal function. The left ventricle demonstrates  regional wall motion abnormalities (see scoring diagram/findings for  description). Left ventricular diastolic  parameters are indeterminate.  2. Right ventricular systolic function is mildly reduced. The right  ventricular size is normal. There is normal pulmonary artery systolic  pressure. The estimated right ventricular systolic pressure is 71.6 mmHg.  3. The mitral valve is grossly normal. Mild mitral valve regurgitation.  4. The aortic valve is tricuspid. There is moderate, nodular  calcification of the left coronary cusp as well as moderate annular  calcification. Aortic valve regurgitation is not visualized.  5. The  inferior vena cava is normal in size with greater than 50%  respiratory variability,  suggesting right atrial pressure of 3 mmHg.     EKG:  EKG is not ordered today.    Recent Labs: 12/15/2019: ALT 24 12/17/2019: Magnesium 2.1 12/22/2019: BUN 9; Creatinine, Ser 0.82; Hemoglobin 7.9; Platelets 322; Potassium 3.8; Sodium 138  Recent Lipid Panel    Component Value Date/Time   CHOL 168 12/15/2019 0315   TRIG 28 12/15/2019 0315   HDL 75 12/15/2019 0315   CHOLHDL 2.2 12/15/2019 0315   VLDL 6 12/15/2019 0315   LDLCALC 87 12/15/2019 0315   LDLDIRECT 105 (H) 05/16/2017 1528   LDLDIRECT 150 (H) 02/13/2013 1502    Physical Exam:    VS:  BP 134/76   Pulse 86   Temp (!) 97 F (36.1 C)   Ht 5\' 11"  (1.803 m)   Wt 131 lb 3.2 oz (59.5 kg)   SpO2 96%   BMI 18.30 kg/m     Wt Readings from Last 3 Encounters:  02/04/20 131 lb 3.2 oz (59.5 kg)  01/31/20 146 lb (66.2 kg)  01/02/20 137 lb (62.1 kg)     GEN:  Well nourished, well developed in no acute distress HEENT: Normal NECK: No JVD; No carotid bruits LYMPHATICS: No lymphadenopathy CARDIAC: RRR, no murmurs, rubs, gallops RESPIRATORY:  Clear to auscultation without rales, wheezing or rhonchi  ABDOMEN: Soft, non-tender, non-distended MUSCULOSKELETAL:  No edema; No deformity  SKIN: Warm and dry NEUROLOGIC:  Alert and oriented x 3 PSYCHIATRIC:  Normal affect   ASSESSMENT:    1. Coronary artery disease involving coronary bypass graft of native heart without angina pectoris   2. Essential hypertension   3. Hyperlipidemia LDL goal <70   4. Tobacco abuse   5. Chronic obstructive pulmonary disease, unspecified COPD type ([place])   6. Prediabetes   7. Lung nodule    PLAN:    In order of problems listed above:  1. CAD s/p CABG: He is still taking 325 mg daily of aspirin, he is actually only taking 81 mg aspirin.  I recommended he continue to 325 mg aspirin instead.  2. Hypertension: Blood pressure stable on current  therapy  3. Hyperlipidemia: Continue Crestor  4. COPD: No acute exacerbation  5. Tobacco abuse: Although he quit smoking temporarily after bypass surgery, he has since picked up tobacco again.  I urged him to consider tobacco cessation given its correlation with coronary artery disease  6. Prediabetes: Managed by primary care provider.  7. Lung nodule: Followed by Dr. Kipp Brood of cardiothoracic surgery.   Medication Adjustments/Labs and Tests Ordered: Current medicines are reviewed at length with the patient today.  Concerns regarding medicines are outlined above.  No orders of the defined types were placed in this encounter.  Meds ordered this encounter  Medications  . aspirin EC 325 MG tablet    Sig: Take 1 tablet (325 mg total) by mouth daily.    Dispense:  90 tablet    Refill:  3    Patient Instructions  Medication Instructions:   RESUME taking Aspirin 325 mg daily, can take 4 tablets of 81 mg daily  TAKE 1.5 (37.tablets of Metoprolol Tartrate 2 times a day  *If you need a refill on your cardiac medications before your next appointment, please call your pharmacy*  Lab Work: NONE ordered at this time of appointment   If you have labs (blood work) drawn today and your tests are completely normal, you will receive your results only by: Marland Kitchen MyChart Message (if you have MyChart) OR . A paper copy  in the mail If you have any lab test that is abnormal or we need to change your treatment, we will call you to review the results.  Testing/Procedures: NONE ordered at this time of appointment   Follow-Up: At [facility], you and your health needs are our priority.  As part of our continuing mission to provide you with exceptional heart care, we have created designated Provider Care Teams.  These Care Teams include your primary Cardiologist (physician) and Advanced Practice Providers (APPs -  Physician Assistants and Nurse Practitioners) who all work together to provide you  with the care you need, when you need it.  We recommend signing up for the patient portal called "MyChart".  Sign up information is provided on this After Visit Summary.  MyChart is used to connect with patients for Virtual Visits (Telemedicine).  Patients are able to view lab/test results, encounter notes, upcoming appointments, etc.  Non-urgent messages can be sent to your provider as well.   To learn more about what you can do with MyChart, go to [email or web address].    Your next appointment:   FOLLOW UP AS SCHEDULED   The format for your next appointment:   In Person  Provider:   Peter Martinique, MD  Other Instructions      Signed, Almyra Deforest, PA  02/06/2020 11:18 PM    [place]

## 2020-02-06 ENCOUNTER — Encounter: Payer: Self-pay | Admitting: Physician Assistant

## 2020-02-07 ENCOUNTER — Ambulatory Visit (INDEPENDENT_AMBULATORY_CARE_PROVIDER_SITE_OTHER): Payer: Medicare Other | Admitting: Family Medicine

## 2020-02-07 ENCOUNTER — Other Ambulatory Visit: Payer: Self-pay

## 2020-02-07 ENCOUNTER — Encounter: Payer: Self-pay | Admitting: Family Medicine

## 2020-02-07 VITALS — BP 138/80 | HR 107 | Ht 71.0 in | Wt 133.0 lb

## 2020-02-07 DIAGNOSIS — Z1211 Encounter for screening for malignant neoplasm of colon: Secondary | ICD-10-CM | POA: Diagnosis not present

## 2020-02-07 DIAGNOSIS — I25118 Atherosclerotic heart disease of native coronary artery with other forms of angina pectoris: Secondary | ICD-10-CM

## 2020-02-07 DIAGNOSIS — Z951 Presence of aortocoronary bypass graft: Secondary | ICD-10-CM | POA: Diagnosis not present

## 2020-02-07 DIAGNOSIS — E7800 Pure hypercholesterolemia, unspecified: Secondary | ICD-10-CM | POA: Diagnosis not present

## 2020-02-07 DIAGNOSIS — J449 Chronic obstructive pulmonary disease, unspecified: Secondary | ICD-10-CM

## 2020-02-07 DIAGNOSIS — R6889 Other general symptoms and signs: Secondary | ICD-10-CM | POA: Diagnosis not present

## 2020-02-07 DIAGNOSIS — R911 Solitary pulmonary nodule: Secondary | ICD-10-CM

## 2020-02-07 NOTE — Progress Notes (Signed)
    SUBJECTIVE:   CHIEF COMPLAINT / HPI:   CC: check up   Patient here for follow up.     HLD / CAD  Patient takes Crestor daily.  Denies missing any doses.  Denies recent CP but reports SOB at baseline.   COPD  Had an exacerbation earlier this month.  Has shortness of breath at baseline.  Requests refill for Albuterol. Uses his Anoro daily. Smokes 1ppd down from 3 ppd.   Drinks a 6 pack per day.  Is not interested in decreasing at this time.   PERTINENT  PMH / PSH: CAD s/p CABG, HTN, HLD COPD, tobacco and alcohol abuse, pulmonary nodule, allergic rhinitis,   OBJECTIVE:   BP 138/80   Pulse (!) 107   Ht 5\' 11"  (1.803 m)   Wt 133 lb (60.3 kg)   SpO2 97%   BMI 18.55 kg/m    GEN: pleasant elderly male, in no acute distress  CV: regular rate and rhythm, no murmurs appreciated, chest walll s/p CABG RESP: no increased work of breathing, clear to ascultation bilaterally with no crackles, wheezes, or rhonchi ABD: Bowel sounds present. Soft, Nontender, Nondistended.  MSK: no appreciable LE edema, or cyanosis noted SKIN: warm, dry NEURO: alert and oriented, moves all extremities appropriately PSYCH: Normal affect and thought content   ASSESSMENT/PLAN:   COPD with chronic bronchitis (HCC) Stable. Continue current regimen.  Refilled Albuterol.  - Contine Albuterol prn  - Continue Anoro Ellipta   Hyperlipidemia Stable.  Increase Crestor 40 mg daily.  Recent (Feb 2021) LDL 87 with goal <70.  - repeat LDL in 6 months   S/P CABG x 4 Following with cardiothoracic surgeon.  Anemia evident on recent CBC.  Will repeat at next follow up visit.  Will obtain Fe studies if amenia persists s/p CABG.  - Crestor increased to 40 mg daily  - Recheck CBC at next visit   Pulmonary nodule Stable right upper lobe pulmonary nodule. Following with [facility]. Per notes wants to do biopsy. Defer care to [facility].  Patient smoking 1ppd from 3 ppd.  Smoking cessation discussed.     Pneumonococcal to be  given at next visit as patient got first vaccine before the age of 76. Has his second dose of the covid vaccine scheduled.   Referral for colonoscopy sent.  Patient requests colonoscopy after June when he "is done with appointments."  [name], [place]

## 2020-02-07 NOTE — Patient Instructions (Signed)
It was great seeing you today!   I'd like to see you back 3 months but if you need to be seen earlier than that for any new issues we're happy to fit you in, just give [place] a call!  - Remember to take 2 pills of your cholesterol medications (for a total of 40 mg) at bedtime  - Stop by the pharmacy to pick up your prescriptions   - You should get a call about scheduling your colonoscopy after June (as requested).  - Let's plan to get your last dose of pneumnococcal vaccine and blood work at your next visit.   - Continue to work on decreasing your cigarette use daily!     If you have questions or concerns please do not hesitate to call at [phone].  Dr. Rushie [facility]

## 2020-02-08 MED ORDER — ROSUVASTATIN CALCIUM 40 MG PO TABS: 40.0000 mg | ORAL_TABLET | Freq: Every day | ORAL | 3 refills | Status: DC

## 2020-02-08 MED ORDER — ALBUTEROL SULFATE HFA 108 (90 BASE) MCG/ACT IN AERS: INHALATION_SPRAY | RESPIRATORY_TRACT | 6 refills | Status: DC

## 2020-02-08 NOTE — Assessment & Plan Note (Signed)
Stable. Continue current regimen.  Refilled Albuterol.  - Contine Albuterol prn  - Continue Anoro Ellipta

## 2020-02-08 NOTE — Assessment & Plan Note (Addendum)
Stable.  Increase Crestor 40 mg daily.  Recent (Feb 2021) LDL 87 with goal <70.  - repeat LDL in 6 months

## 2020-02-10 DIAGNOSIS — R911 Solitary pulmonary nodule: Secondary | ICD-10-CM | POA: Insufficient documentation

## 2020-02-10 NOTE — Assessment & Plan Note (Addendum)
Following with cardiothoracic surgeon.  Anemia evident on recent CBC.  Will repeat at next follow up visit.  Will obtain Fe studies if amenia persists s/p CABG.  - Crestor increased to 40 mg daily  - Recheck CBC at next visit

## 2020-02-10 NOTE — Assessment & Plan Note (Addendum)
Stable right upper lobe pulmonary nodule. Following with [facility]. Per notes wants to do biopsy. Defer care to [facility].  Patient smoking 1ppd from 3 ppd.  Smoking cessation discussed.

## 2020-02-11 ENCOUNTER — Ambulatory Visit: Payer: Medicare Other | Attending: Family

## 2020-02-11 DIAGNOSIS — Z23 Encounter for immunization: Secondary | ICD-10-CM

## 2020-02-12 ENCOUNTER — Other Ambulatory Visit: Payer: Self-pay

## 2020-02-12 ENCOUNTER — Ambulatory Visit: Payer: Medicare Other | Admitting: Pulmonary Disease

## 2020-02-12 ENCOUNTER — Encounter: Payer: Self-pay | Admitting: Pulmonary Disease

## 2020-02-12 VITALS — BP 114/64 | HR 119 | Ht 71.0 in | Wt 133.0 lb

## 2020-02-12 DIAGNOSIS — J9611 Chronic respiratory failure with hypoxia: Secondary | ICD-10-CM | POA: Diagnosis not present

## 2020-02-12 DIAGNOSIS — R6889 Other general symptoms and signs: Secondary | ICD-10-CM | POA: Diagnosis not present

## 2020-02-12 DIAGNOSIS — J432 Centrilobular emphysema: Secondary | ICD-10-CM | POA: Diagnosis not present

## 2020-02-12 DIAGNOSIS — R911 Solitary pulmonary nodule: Secondary | ICD-10-CM | POA: Diagnosis not present

## 2020-02-12 NOTE — Progress Notes (Signed)
Synopsis: Referred in April 2021 for right upper lobe lung nodule by [name], DO  Subjective:   PATIENT ID: Lucas [name]: male DOB: [birth year], MRN: [id number]  Chief Complaint  Patient presents with  . Consult    Pt being referred by Dr. Kipp Brood for possible bronch.  Pt does have complaints of SOB that he states can happen all the time but will be worse if anxiety kicks in. Pt also has a cough in the mornings with clear phlegm.    This is a 69 year old gentleman history of COPD, chronic hypoxemic respiratory failure on 3 L nasal cannula, history of hypertension.  Patient had a CT scan of the chest and February 2021 which revealed a 11 mm right upper lobe lung nodule concerning for a primary bronchogenic carcinoma.  Patient was referred to cardiothoracic surgery for evaluation patient was seen by Dr. Kipp Brood on 01/31/2020.Marland Kitchen  Patient underwent CABG by Dr. Kipp Brood several weeks prior.  He is now doing well post CABG and was referred for evaluation of right upper lobe nodule potential for navigational bronchoscopy, biopsy.  Unfortunately he still smoking.  And Dr. Kipp Brood is deemed him not a surgical candidate at this time unless he is able to quit smoking.  Inevitably may be better for consideration of SBRT.  OV 02/12/2020: Patient present the office today.  Denies hemoptysis.  Does have some weight loss.  He has recovered good from his previous surgery.  He still feels weak at times.  Denies any significant lower extremity edema or dyspnea on exertion at his baseline.  He does use oxygen for 3 L nasal cannula.  He has present today with his daughter in the office.   Past Medical History:  Diagnosis Date  . Acute blood loss anemia 06/21/2018  . Acute upper GI bleed 06/21/2018  . Allergy   . Bilateral AVN of femurs ([place]) 06/25/2018   See 11/2017 Chest CT report  . COPD 12/21/2006  . Environmental allergies   . History of femur fracture 06/25/2018   11/2017 CT AP showed Remote  nonunited right femur greater trochanter fracture  . History of Fracture of right tibia 06/26/2018  . History of multiple right thoacic rib fractures 06/25/2018  . History of Right clavicular fracture 06/25/2018  . HYPERLIPIDEMIA 12/21/2006  . HYPERTENSION, BENIGN SYSTEMIC 12/21/2006  . Mallory-Weiss tear   . TOBACCO DEPENDENCE 12/21/2006     Family History  Problem Relation Age of Onset  . Emphysema Father      Past Surgical History:  Procedure Laterality Date  . ANKLE SURGERY     left ankle  . APPLICATION OF WOUND VAC  01/06/2012   Procedure: APPLICATION OF WOUND VAC;  Surgeon: Meredith Pel, MD;  Location: [facility];  Service: Orthopedics;  Laterality: Left;  . APPLICATION OF WOUND VAC Right 10/17/2015   Procedure: APPLICATION OF WOUND VAC;  Surgeon: Leandrew Koyanagi, MD;  Location: [facility];  Service: Orthopedics;  Laterality: Right;  . BIOPSY  06/22/2018   Procedure: BIOPSY;  Surgeon: [id number] v, DO;  Location: [facility];  Service: Gastroenterology;;  . CORONARY ARTERY BYPASS GRAFT N/A 12/16/2019   Procedure: CORONARY ARTERY BYPASS GRAFTING (CABG) TIMES 4 USING LEFT INTERNAL MAMMARY ARTERY AND ENDOSCOPICALLY HARVESTED RIGHT SAPHENOUS VEIN;  Surgeon: Lajuana Matte, MD;  Location: [facility];  Service: Open Heart Surgery;  Laterality: N/A;  . ESOPHAGOGASTRODUODENOSCOPY (EGD) WITH PROPOFOL N/A 06/22/2018   Procedure: ESOPHAGOGASTRODUODENOSCOPY (EGD) WITH PROPOFOL;  Surgeon: [id number] v, DO;  Location: [facility];  Service: Gastroenterology;  Laterality: N/A;  . EXTERNAL FIXATION LEG  01/06/2012   Procedure: EXTERNAL FIXATION LEG;  Surgeon: Meredith Pel, MD;  Location: [facility];  Service: Orthopedics;  Laterality: Left;  Smith-nephew external fixator set  . EXTERNAL FIXATION LEG Right 09/16/2015   Procedure: EXTERNAL FIXATION LEG/LARGE;  Surgeon: Mcarthur Rossetti, MD;  Location: [facility];  Service: Orthopedics;  Laterality: Right;  . EXTERNAL FIXATION REMOVAL Right  09/22/2015   Procedure: REMOVAL EXTERNAL FIXATION LEG;  Surgeon: Mcarthur Rossetti, MD;  Location: [facility];  Service: Orthopedics;  Laterality: Right;  . FASCIOTOMY  01/10/2012   Procedure: FASCIOTOMY;  Surgeon: Rozanna Box, MD;  Location: [facility];  Service: Orthopedics;  Laterality: Left;  status post fasciotomy with wound vac left calf; adjustment external fixator left tibia under fluoro; retention suture/wound vac placement left lateral calf wound  . FASCIOTOMY  01/06/2012   Procedure: FASCIOTOMY;  Surgeon: Meredith Pel, MD;  [facility];  Service: Orthopedics;  Laterality: Left;  Four compartment fasciotomy  . HOT HEMOSTASIS N/A 06/22/2018   Procedure: HEMOSTASIS;  Surgeon: [id number] v, DO;  Location: [facility];  Service: Gastroenterology;  Laterality: N/A;  Epinepherine and clip placed  . I & D EXTREMITY  01/12/2012   Procedure: IRRIGATION AND DEBRIDEMENT EXTREMITY;  Surgeon: Rozanna Box, MD;  Location: [facility];  Service: Orthopedics;  Laterality: Left;  LAYERD CLOSURE LEFT LEG WOUND  . I & D EXTREMITY Right 10/17/2015   Procedure: IRRIGATION AND DEBRIDEMENT EXTREMITY;  Surgeon: Leandrew Koyanagi, MD;  Location: [facility];  Service: Orthopedics;  Laterality: Right;  . I & D EXTREMITY Right 10/20/2015   Procedure: REPEAT IRRIGATION AND DEBRIDEMENT EXTREMITY, right lower leg;  Surgeon: Mcarthur Rossetti, MD;  Location: [facility];  Service: Orthopedics;  Laterality: Right;  . KNEE SURGERY     left knee and right knee  . LEFT HEART CATH AND CORONARY ANGIOGRAPHY N/A 06/27/2018   Procedure: LEFT HEART CATH AND CORONARY ANGIOGRAPHY;  Surgeon: Martinique, Peter M, MD;  Location: [facility];  Service: Cardiovascular;  Laterality: N/A;  . LEFT HEART CATH AND CORONARY ANGIOGRAPHY N/A 12/12/2019   Procedure: LEFT HEART CATH AND CORONARY ANGIOGRAPHY;  Surgeon: Wellington Hampshire, MD;  Location: [facility];  Service: Cardiovascular;  Laterality: N/A;  . MASS EXCISION Left  09/22/2015   Procedure: EXCISION MASS;  Surgeon: Mcarthur Rossetti, MD;  Location: [facility];  Service: Orthopedics;  Laterality: Left;  . ORIF TIBIA PLATEAU  02/07/2012   Procedure: OPEN REDUCTION INTERNAL FIXATION (ORIF) TIBIAL PLATEAU;  Surgeon: Rozanna Box, MD;  Location: [facility];  Service: Orthopedics;  Laterality: Left;  ORIF LEFT TIBIAL PLATEAU/ REMOVE EXTERNAL FIXATION LEFT LEG  . ORIF TIBIA PLATEAU Right 09/22/2015   Procedure: REMOVAL OF EXTERNAL FIXATOR RIGHT LEG, OPEN REDUCTION INTERNAL FIXATION (ORIF) RIGHT TIBIAL PLATEAU, EXCISION SMALL MASS LEFT LEG;  Surgeon: Mcarthur Rossetti, MD;  Location: [facility];  Service: Orthopedics;  Laterality: Right;  . SKIN SPLIT GRAFT Right 10/23/2015   Procedure: RIGHT GASTROC FLAP SPLIT THICKNESS SKIN GRAFT FROM RIGHT THING TO RIGHT LEG;  Surgeon: Irene Limbo, MD;  Location: [facility];  Service: Plastics;  Laterality: Right;  . TEE WITHOUT CARDIOVERSION N/A 12/16/2019   Procedure: TRANSESOPHAGEAL ECHOCARDIOGRAM (TEE);  Surgeon: Lajuana Matte, MD;  Location: [facility];  Service: Open Heart Surgery;  Laterality: N/A;  . WRIST SURGERY     left    Social History   Socioeconomic History  .  Marital status: Married    Spouse name: Not on file  . Number of children: Not on file  . Years of education: Not on file  . Highest education level: Not on file  Occupational History  . Not on file  Tobacco Use  . Smoking status: Current Every Day Smoker    Packs/day: 3.00    Years: 51.00    Pack years: 153.00    Types: Cigarettes  . Smokeless tobacco: Former Systems developer    Types: Snuff, Chew    Quit date: 01/05/1965  . Tobacco comment: currently smoking 1ppd as of 02/12/20  Substance and Sexual Activity  . Alcohol use: Yes    Alcohol/week: 49.0 standard drinks    Types: 49 Cans of beer per week    Comment: drinks about a 6-pack/day  . Drug use: No  . Sexual activity: Yes    Partners: Female  Other Topics Concern  . Not on file  Social History  Narrative  . Not on file   Social Determinants of Health   Financial Resource Strain:   . Difficulty of Paying Living Expenses:   Food Insecurity:   . Worried About Charity fundraiser in the Last Year:   . Arboriculturist in the Last Year:   Transportation Needs:   . Film/video editor (Medical):   Marland Kitchen Lack of Transportation (Non-Medical):   Physical Activity:   . Days of Exercise per Week:   . Minutes of Exercise per Session:   Stress:   . Feeling of Stress :   Social Connections:   . Frequency of Communication with Friends and Family:   . Frequency of Social Gatherings with Friends and Family:   . Attends Religious Services:   . Active Member of Clubs or Organizations:   . Attends Archivist Meetings:   Marland Kitchen Marital Status:   Intimate Partner Violence:   . Fear of Current or Ex-Partner:   . Emotionally Abused:   Marland Kitchen Physically Abused:   . Sexually Abused:      Allergies  Allergen Reactions  . Ace Inhibitors Swelling  . Ampicillin Nausea And Vomiting  . Penicillins Nausea And Vomiting    Did it involve swelling of the face/tongue/throat, SOB, or low BP? N Did it involve sudden or severe rash/hives, skin peeling, or any reaction on the inside of your mouth or nose? N Did you need to seek medical attention at a hospital or doctor's office? N When did it last happen?Teenager If all above answers are "NO", may proceed with cephalosporin use.  . Codeine Nausea And Vomiting  . Lisinopril Swelling    REACTION: angioedema     Outpatient Medications Prior to Visit  Medication Sig Dispense Refill  . albuterol (PROAIR HFA) 108 (90 Base) MCG/ACT inhaler INHALE 2 PUFFS BY MOUTH EVERY 4 HOURS AS NEEDED FOR WHEEZE OR FOR SHORTNESS OF BREATH 6.7 g 6  . aspirin EC 81 MG tablet Take 81 mg by mouth daily.    . fluticasone (FLONASE) 50 MCG/ACT nasal spray SPRAY 2 SPRAYS INTO EACH NOSTRIL EVERY DAY 16 g 2  . guaiFENesin (MUCINEX) 600 MG 12 hr tablet Take 1 tablet (600 mg  total) by mouth 2 (two) times daily as needed.    Marland Kitchen losartan (COZAAR) 25 MG tablet Take 0.5 tablets (12.5 mg total) by mouth daily. 30 tablet 3  . metoprolol tartrate (LOPRESSOR) 25 MG tablet TAKE 1.5 TABLETS (37.5 MG TOTAL) BY MOUTH 2 (TWO) TIMES DAILY. 270 tablet  1  . montelukast (SINGULAIR) 10 MG tablet TAKE 1 TABLET BY MOUTH EVERYDAY AT BEDTIME 90 tablet 1  . nitroGLYCERIN (NITROSTAT) 0.4 MG SL tablet Place 1 tablet (0.4 mg total) under the tongue every 5 (five) minutes as needed for chest pain. 50 tablet 3  . rosuvastatin (CRESTOR) 40 MG tablet Take 1 tablet (40 mg total) by mouth daily. 90 tablet 3  . umeclidinium-vilanterol (ANORO ELLIPTA) 62.5-25 MCG/INH AEPB Inhale 1 puff into the lungs daily. 60 each 3  . pantoprazole (PROTONIX) 40 MG tablet Take 1 tablet (40 mg total) by mouth 2 (two) times daily. (Patient not taking: Reported on 02/04/2020) 108 tablet 0  . aspirin EC 325 MG tablet Take 1 tablet (325 mg total) by mouth daily. 90 tablet 3  . multivitamin-iron-minerals-folic acid (CENTRUM) chewable tablet Chew 1 tablet by mouth daily. 30 tablet 3  . traMADol (ULTRAM) 50 MG tablet Take 1 tablet (50 mg total) by mouth every 4 (four) hours as needed for moderate pain. 30 tablet 0   No facility-administered medications prior to visit.    Review of Systems  Constitutional: Negative for chills, fever, malaise/fatigue and weight loss.  HENT: Negative for hearing loss, sore throat and tinnitus.   Eyes: Negative for blurred vision and double vision.  Respiratory: Positive for shortness of breath. Negative for cough, hemoptysis, sputum production, wheezing and stridor.   Cardiovascular: Negative for chest pain, palpitations, orthopnea, leg swelling and PND.  Gastrointestinal: Negative for abdominal pain, constipation, diarrhea, heartburn, nausea and vomiting.  Genitourinary: Negative for dysuria, hematuria and urgency.  Musculoskeletal: Negative for joint pain and myalgias.  Skin: Negative for  itching and rash.  Neurological: Negative for dizziness, tingling, weakness and headaches.  Endo/Heme/Allergies: Negative for environmental allergies. Does not bruise/bleed easily.  Psychiatric/Behavioral: Negative for depression. The patient is not nervous/anxious and does not have insomnia.   All other systems reviewed and are negative.    Objective:  Physical Exam Vitals reviewed.  Constitutional:      General: He is not in acute distress.    Appearance: He is well-developed.  HENT:     Head: Normocephalic and atraumatic.     Mouth/Throat:     Pharynx: No oropharyngeal exudate.  Eyes:     Conjunctiva/sclera: Conjunctivae normal.     Pupils: Pupils are equal, round, and reactive to light.  Neck:     Vascular: No JVD.     Trachea: No tracheal deviation.     Comments: Loss of supraclavicular fat Cardiovascular:     Rate and Rhythm: Normal rate and regular rhythm.     Heart sounds: S1 normal and S2 normal.     Comments: Distant heart tones Pulmonary:     Effort: No tachypnea or accessory muscle usage.     Breath sounds: No stridor. Decreased breath sounds (throughout all lung fields) present. No wheezing, rhonchi or rales.  Abdominal:     General: Bowel sounds are normal. There is no distension.     Palpations: Abdomen is soft.     Tenderness: There is no abdominal tenderness.  Musculoskeletal:        General: Deformity (muscle wasting ) present.  Skin:    General: Skin is warm and dry.     Capillary Refill: Capillary refill takes less than 2 seconds.     Findings: No rash.  Neurological:     Mental Status: He is alert and oriented to person, place, and time.  Psychiatric:        Behavior:  Behavior normal.      Vitals:   02/12/20 1429  BP: 114/64  Pulse: (!) 119  SpO2: 100%  Weight: 133 lb (60.3 kg)  Height: 5\' 11"  (1.803 m)   100% on 3 L/min BMI Readings from Last 3 Encounters:  02/12/20 18.55 kg/m  02/07/20 18.55 kg/m  02/04/20 18.30 kg/m   Wt  Readings from Last 3 Encounters:  02/12/20 133 lb (60.3 kg)  02/07/20 133 lb (60.3 kg)  02/04/20 131 lb 3.2 oz (59.5 kg)     CBC    Component Value Date/Time   WBC 6.6 12/22/2019 0251   RBC 2.44 (L) 12/22/2019 0251   HGB 7.9 (L) 12/22/2019 0251   HGB 10.7 (L) 07/03/2018 1045   HCT 23.6 (L) 12/22/2019 0251   HCT 31.8 (L) 07/03/2018 1045   PLT 322 12/22/2019 0251   PLT 381 07/03/2018 1045   MCV 96.7 12/22/2019 0251   MCV 96 07/03/2018 1045   MCH 32.4 12/22/2019 0251   MCHC 33.5 12/22/2019 0251   RDW 13.3 12/22/2019 0251   RDW 14.2 07/03/2018 1045   LYMPHSABS 2.3 01/21/2019 0631   LYMPHSABS 1.1 07/03/2018 1045   MONOABS 0.7 01/21/2019 0631   EOSABS 0.1 01/21/2019 0631   EOSABS 0.1 07/03/2018 1045   BASOSABS 0.0 01/21/2019 0631   BASOSABS 0.0 07/03/2018 1045     Chest Imaging: February 2021 CT chest: Right upper lobe 11 mm spiculated lung nodule concerning for primary bronchogenic carcinoma. The patient's images have been independently reviewed by me.    Pulmonary Functions Testing Results: PFT Results Latest Ref Rng & Units 12/13/2019  FVC-Pre L 2.76  FVC-Predicted Pre % 101  Pre FEV1/FVC % % 48  FEV1-Pre L 1.32  FEV1-Predicted Pre % 65    FeNO: none   Pathology: none   Echocardiogram: none  Heart Catheterization: none     Assessment & Plan:     ICD-10-CM   1. Nodule of upper lobe of right lung  R91.1 CT Super D Chest Wo Contrast    NM PET Image Initial (PI) Skull Base To Thigh    Ambulatory referral to Pulmonology  2. Centrilobular emphysema ([place])  J43.2   3. Chronic hypoxemic respiratory failure ([facility])  J96.11     Assessment:   This is a new diagnosis of a right upper lobe spiculated lung nodule, 11 mm concerning for primary bronchogenic carcinoma.  This is likely a malignancy. Tissue biopsy has not been obtained.  Of note he does have chronic hypoxemic respiratory failure on 3 L nasal cannula.  He also has significant centrilobular emphysema on CT  imaging consistent with COPD diagnosis.  Plan Following Extensive Data Review & Interpretation:  . I reviewed prior external note(s) from April 21 Dr. Kipp Brood cardiothoracic surgery consultation . I reviewed the result(s) of office-based spirometry . I have ordered super D noncontrasted CT of the chest, nuclear medicine pet imaging, referral for video bronchoscopy with electromagnetic navigational bronchoscopy and fiducial placement.  Independent interpretation of tests . Review of patient's February 2021 CT chest images revealed right upper lobe spiculated 11 mm lung nodule with associated centrilobular emphysema concerning for primary bronchogenic carcinoma. The patient's images have been independently reviewed by me.    Discussion of management with Dr. Kipp Brood from cardiothoracic surgery.  If patient is a ongoing smoker and unwilling to quit Dr. Kipp Brood has deemed patient not to be a surgical candidate.  Therefore, we will plan for video bronchoscopy with electromagnetic navigation as well as fiducial placement  for possible SBRT candidacy.  Today in the office we discussed the risk, benefits and alternatives of proceeding with navigational bronchoscopy to include bleeding, pneumothorax as well as potential for hospitalization following procedure.  Patient is agreeable to these risks and is willing to proceed. They would like to wait until the patient's daughter is able to be here in town to help and bring him to this.  Next available time for this will be Mar 03, 2020.  In the meantime we will obtain the imaging ordered above and plan for the procedure at this time.     Current Outpatient Medications:  .  albuterol (PROAIR HFA) 108 (90 Base) MCG/ACT inhaler, INHALE 2 PUFFS BY MOUTH EVERY 4 HOURS AS NEEDED FOR WHEEZE OR FOR SHORTNESS OF BREATH, Disp: 6.7 g, Rfl: 6 .  aspirin EC 81 MG tablet, Take 81 mg by mouth daily., Disp: , Rfl:  .  fluticasone (FLONASE) 50 MCG/ACT nasal spray,  SPRAY 2 SPRAYS INTO EACH NOSTRIL EVERY DAY, Disp: 16 g, Rfl: 2 .  guaiFENesin (MUCINEX) 600 MG 12 hr tablet, Take 1 tablet (600 mg total) by mouth 2 (two) times daily as needed., Disp: , Rfl:  .  losartan (COZAAR) 25 MG tablet, Take 0.5 tablets (12.5 mg total) by mouth daily., Disp: 30 tablet, Rfl: 3 .  metoprolol tartrate (LOPRESSOR) 25 MG tablet, TAKE 1.5 TABLETS (37.5 MG TOTAL) BY MOUTH 2 (TWO) TIMES DAILY., Disp: 270 tablet, Rfl: 1 .  montelukast (SINGULAIR) 10 MG tablet, TAKE 1 TABLET BY MOUTH EVERYDAY AT BEDTIME, Disp: 90 tablet, Rfl: 1 .  nitroGLYCERIN (NITROSTAT) 0.4 MG SL tablet, Place 1 tablet (0.4 mg total) under the tongue every 5 (five) minutes as needed for chest pain., Disp: 50 tablet, Rfl: 3 .  rosuvastatin (CRESTOR) 40 MG tablet, Take 1 tablet (40 mg total) by mouth daily., Disp: 90 tablet, Rfl: 3 .  umeclidinium-vilanterol (ANORO ELLIPTA) 62.5-25 MCG/INH AEPB, Inhale 1 puff into the lungs daily., Disp: 60 each, Rfl: 3 .  pantoprazole (PROTONIX) 40 MG tablet, Take 1 tablet (40 mg total) by mouth 2 (two) times daily. (Patient not taking: Reported on 02/04/2020), Disp: 108 tablet, Rfl: 0   Garner Nash, DO [facility] 02/12/2020 2:36 PM

## 2020-02-12 NOTE — Patient Instructions (Signed)
Thank you for visiting Dr. Valeta Harms at [facility]. Today we recommend the following:  Orders Placed This Encounter  Procedures  . CT Super D Chest Wo Contrast  . NM PET Image Initial (PI) Skull Base To Thigh  . Ambulatory referral to Pulmonology   Return in about 6 weeks (around 03/25/2020) for with APP. As I will be out for a few weeks around this time.     Please do your part to reduce the spread of COVID-19.

## 2020-02-18 ENCOUNTER — Inpatient Hospital Stay: Admission: RE | Admit: 2020-02-18 | Payer: Medicare Other | Source: Ambulatory Visit

## 2020-02-21 ENCOUNTER — Ambulatory Visit (HOSPITAL_COMMUNITY): Admission: RE | Admit: 2020-02-21 | Payer: Medicare Other | Source: Ambulatory Visit

## 2020-02-23 DIAGNOSIS — J90 Pleural effusion, not elsewhere classified: Secondary | ICD-10-CM | POA: Diagnosis not present

## 2020-02-23 DIAGNOSIS — J449 Chronic obstructive pulmonary disease, unspecified: Secondary | ICD-10-CM | POA: Diagnosis not present

## 2020-02-23 DIAGNOSIS — I2510 Atherosclerotic heart disease of native coronary artery without angina pectoris: Secondary | ICD-10-CM | POA: Diagnosis not present

## 2020-02-29 ENCOUNTER — Inpatient Hospital Stay (HOSPITAL_COMMUNITY): Admission: RE | Admit: 2020-02-29 | Payer: Medicare Other | Source: Ambulatory Visit

## 2020-03-01 ENCOUNTER — Other Ambulatory Visit: Payer: Self-pay | Admitting: Family Medicine

## 2020-03-01 DIAGNOSIS — I208 Other forms of angina pectoris: Secondary | ICD-10-CM

## 2020-03-02 ENCOUNTER — Telehealth: Payer: Self-pay | Admitting: Pulmonary Disease

## 2020-03-02 ENCOUNTER — Encounter (HOSPITAL_COMMUNITY): Payer: Self-pay | Admitting: Vascular Surgery

## 2020-03-02 ENCOUNTER — Inpatient Hospital Stay: Admission: RE | Admit: 2020-03-02 | Payer: Medicare Other | Source: Ambulatory Visit

## 2020-03-02 NOTE — Telephone Encounter (Signed)
Thanks for letting me know  Garner Nash, DO [facility] 03/02/2020 3:51 PM

## 2020-03-02 NOTE — Progress Notes (Addendum)
I called Lucas [name], patient is not at home, wife is not sure where he wen t or when he will be home.  Lucas [name] said that patient is not having surgery, "he didn't get the test he was suppose to have and some Xrays or something." "He doesn't have transportation to get to test."  I called Lucas [name] cell phone, I get a busy signal.I called Dr. Juline Patch office , spoke with Maudie Mercury and told her that patient is cancelling procedure and need to be cancelled with OR  or ENDO schedulers.

## 2020-03-02 NOTE — Anesthesia Preprocedure Evaluation (Deleted)
Anesthesia Evaluation Anesthesia Physical Anesthesia Plan  ASA:   Anesthesia Plan:    Post-op Pain Management:    Induction:   PONV Risk Score and Plan:   Airway Management Planned:   Additional Equipment:   Intra-op Plan:   Post-operative Plan:   Informed Consent:   Plan Discussed with:   Anesthesia Plan Comments: (PAT note written 03/02/2020 by Myra Gianotti, PA-C. )        Anesthesia Quick Evaluation

## 2020-03-02 NOTE — Progress Notes (Addendum)
Anesthesia Chart Review: SAME DAY WORK-UP   Case: [id number] Date/Time: 03/03/20 0730   Procedure: VIDEO BRONCHOSCOPY WITH ENDOBRONCHIAL NAVIGATION (Right )   Anesthesia type: General   Pre-op diagnosis: NODULE OF UPPER LOBE RIGHT LUNG   Location: [place] [facility] [place] [facility]   Surgeons: Garner Nash, DO      DISCUSSION: Patient is a 69 year old male scheduled for the above procedure.  Currently case is scheduled for 03/03/2020; however, initial communication from patient's wife indicates that surgery may be rescheduled due to need for more imaging and a COVID-19 test for which he is having difficulty arranging transportation.  History includes smoking, CAD (NSTEMI, s/p CABG 12/16/19: LIMA-LAD, SVG-PDA, SVG-D1, SVG-OM2), HLD, HTN, COPD, hematemesis/Mallory Mariel Kansky Tear (s/p hemostatic clip placement 06/22/18), chronic hypoxemic respiratory failure (3L O2/[place]), RUL lung nodule (11 mm 11/2019). Alcohol intake is recorded as a ~ 6 pack/day. Reportedly smoking 1PPD, down from 3PPD.   VS: He is for Vitals on arrival. On 02/12/20: BP: 114/64  Pulse: (!) 119  SpO2: 100%  Weight: 133 lb (60.3 kg)  Height: 5\' 11"  (1.803 m)  Heart rhythm was documented as "regular rhythm"  - HR 107, "regular rate and rhythm". HR 86 at 02/04/20 cardiology visit.    PROVIDERS: [name], DO is PCP ([place]) Melodie Bouillon, MD is CT surgeon Martinique, Peter, MD is cardiologist. Last evaluation 02/04/20 with Almyra Deforest, [place].   LABS: Currently, last lab results include: Lab Results  Component Value Date   WBC 6.6 12/22/2019   HGB 7.9 (L) 12/22/2019   HCT 23.6 (L) 12/22/2019   PLT 322 12/22/2019   GLUCOSE 98 12/22/2019   ALT 24 12/15/2019   AST 32 12/15/2019   NA 138 12/22/2019   K 3.8 12/22/2019   CL 102 12/22/2019   CREATININE 0.82 12/22/2019   BUN 9 12/22/2019   CO2 28 12/22/2019   INR 1.3 (H) 12/16/2019   HGBA1C 6.1 (H) 12/13/2019     IMAGES: Chest CT scheduled for  03/02/20.   EKG: 01/02/20: Normal sinus rhythm Minimal voltage criteria for LVH, may be normal variant Nonspecific T wave abnormality   CV: Post-CABG intraoperative TEE 12/16/19: POST-OP IMPRESSIONS  - Left Ventricle: The left ventricle is unchanged from pre-bypass.  - Right Ventricle: The right ventricle appears unchanged from pre-bypass.  - Aorta: The aorta appears unchanged from pre-bypass.  - Left Atrium: The left atrium appears unchanged from pre-bypass.  - Left Atrial Appendage: The left atrial appendage appears unchanged from  pre-bypass.  - Aortic Valve: The aortic valve appears unchanged from pre-bypass.  - Mitral Valve: The mitral valve appears unchanged from pre-bypass.  - Tricuspid Valve: The tricuspid valve appears unchanged from pre-bypass.  - Interventricular Septum: The interventricular septum appears unchanged  from  pre-bypass.  - Pericardium: The pericardium appears unchanged from pre-bypass.  - Comments: - LVEF preserved. CO > 5 L/min  - No significant RWMA.  - No valvular changes.  - No aortic dissection noted after cannula removal.    Carotid US 12/15/19: Summary:  Right Carotid: Velocities in the right ICA are consistent with a 1-39%  stenosis.  Left Carotid: Velocities in the left ICA are consistent with a 1-39%  stenosis.  Vertebrals: Bilateral vertebral arteries demonstrate antegrade flow.  Subclavians: Normal flow hemodynamics were seen in bilateral subclavian        arteries.    Echo 12/12/19: IMPRESSIONS  1. Left ventricular ejection fraction, by estimation, is 50 to 55%. The  left ventricle has low normal function. The left ventricle demonstrates  regional wall motion abnormalities (see scoring diagram/findings for  description). Left ventricular diastolic  parameters are indeterminate.  2. Right ventricular systolic function is mildly reduced. The right  ventricular size is normal. There is normal pulmonary artery systolic   pressure. The estimated right ventricular systolic pressure is 78.6 mmHg.  3. The mitral valve is grossly normal. Mild mitral valve regurgitation.  4. The aortic valve is tricuspid. There is moderate, nodular  calcification of the left coronary cusp as well as moderate annular  calcification. Aortic valve regurgitation is not visualized.  5. The inferior vena cava is normal in size with greater than 50%  respiratory variability, suggesting right atrial pressure of 3 mmHg.    Cardiac cath 12/12/19:  Dist LM to Prox LAD lesion is 80% stenosed.  Mid LM to Dist LM lesion is 50% stenosed.  Ost 2nd Diag lesion is 100% stenosed.  Ost Cx to Prox Cx lesion is 85% stenosed.  Prox RCA lesion is 60% stenosed.  Mid RCA-1 lesion is 99% stenosed.  Mid RCA-2 lesion is 50% stenosed.  The left ventricular systolic function is normal.  LV end diastolic pressure is mildly elevated.  The left ventricular ejection fraction is 50-55% by visual estimate.  Ost LM lesion is 40% stenosed.  Prox Cx to Mid Cx lesion is 95% stenosed.  Ost 1st Diag lesion is 60% stenosed. 1.  Severe heavily calcified three-vessel coronary artery disease. 2.  Normal LV systolic function and mildly elevated left ventricular end-diastolic pressure. Recommendations: The coronary arteries are not suitable for PCI due to heavy diffuse calcifications.  Recommend evaluation for CABG especially that the patient has right lung mass which will likely require resection at the same time.  The patient is also a poor candidate for dual antiplatelet therapy.  There are good distal targets for CABG. - S/p CABG 12/16/19   Past Medical History:  Diagnosis Date  . Acute blood loss anemia 06/21/2018  . Acute upper GI bleed 06/21/2018  . Allergy   . Bilateral AVN of femurs ([place]) 06/25/2018   See 11/2017 Chest CT report  . COPD 12/21/2006  . Environmental allergies   . History of femur fracture 06/25/2018   11/2017 CT AP showed Remote  nonunited right femur greater trochanter fracture  . History of Fracture of right tibia 06/26/2018  . History of multiple right thoacic rib fractures 06/25/2018  . History of Right clavicular fracture 06/25/2018  . HYPERLIPIDEMIA 12/21/2006  . HYPERTENSION, BENIGN SYSTEMIC 12/21/2006  . Mallory-Weiss tear   . TOBACCO DEPENDENCE 12/21/2006    Past Surgical History:  Procedure Laterality Date  . ANKLE SURGERY     left ankle  . APPLICATION OF WOUND VAC  01/06/2012   Procedure: APPLICATION OF WOUND VAC;  Surgeon: Meredith Pel, MD;  Location: [facility];  Service: Orthopedics;  Laterality: Left;  . APPLICATION OF WOUND VAC Right 10/17/2015   Procedure: APPLICATION OF WOUND VAC;  Surgeon: Leandrew Koyanagi, MD;  Location: [place];  Service: Orthopedics;  Laterality: Right;  . BIOPSY  06/22/2018   Procedure: BIOPSY;  Surgeon: [id number] v, DO;  Location: [facility];  Service: Gastroenterology;;  . CORONARY ARTERY BYPASS GRAFT N/A 12/16/2019   Procedure: CORONARY ARTERY BYPASS GRAFTING (CABG) TIMES 4 USING LEFT INTERNAL MAMMARY ARTERY AND ENDOSCOPICALLY HARVESTED RIGHT SAPHENOUS VEIN;  Surgeon: Lajuana Matte, MD;  Location: [place];  Service: Open Heart Surgery;  Laterality: N/A;  . ESOPHAGOGASTRODUODENOSCOPY (EGD) WITH  PROPOFOL N/A 06/22/2018   Procedure: ESOPHAGOGASTRODUODENOSCOPY (EGD) WITH PROPOFOL;  Surgeon: [id number] v, DO;  Location: [facility];  Service: Gastroenterology;  Laterality: N/A;  . EXTERNAL FIXATION LEG  01/06/2012   Procedure: EXTERNAL FIXATION LEG;  Surgeon: Meredith Pel, MD;  Location: [facility];  Service: Orthopedics;  Laterality: Left;  Smith-nephew external fixator set  . EXTERNAL FIXATION LEG Right 09/16/2015   Procedure: EXTERNAL FIXATION LEG/LARGE;  Surgeon: Mcarthur Rossetti, MD;  Location: [place];  Service: Orthopedics;  Laterality: Right;  . EXTERNAL FIXATION REMOVAL Right 09/22/2015   Procedure: REMOVAL EXTERNAL FIXATION LEG;  Surgeon: Mcarthur Rossetti, MD;  Location: [place];  Service: Orthopedics;  Laterality: Right;  . FASCIOTOMY  01/10/2012   Procedure: FASCIOTOMY;  Surgeon: Rozanna Box, MD;  Location: [place];  Service: Orthopedics;  Laterality: Left;  status post fasciotomy with wound vac left calf; adjustment external fixator left tibia under fluoro; retention suture/wound vac placement left lateral calf wound  . FASCIOTOMY  01/06/2012   Procedure: FASCIOTOMY;  Surgeon: Meredith Pel, MD;  [facility]: [facility];  Service: Orthopedics;  Laterality: Left;  Four compartment fasciotomy  . HOT HEMOSTASIS N/A 06/22/2018   Procedure: HEMOSTASIS;  Surgeon: [id number] v, DO;  Location: [facility];  Service: Gastroenterology;  Laterality: N/A;  Epinepherine and clip placed  . I & D EXTREMITY  01/12/2012   Procedure: IRRIGATION AND DEBRIDEMENT EXTREMITY;  Surgeon: Rozanna Box, MD;  Location: [place];  Service: Orthopedics;  Laterality: Left;  LAYERD CLOSURE LEFT LEG WOUND  . I & D EXTREMITY Right 10/17/2015   Procedure: IRRIGATION AND DEBRIDEMENT EXTREMITY;  Surgeon: Leandrew Koyanagi, MD;  Location: [place];  Service: Orthopedics;  Laterality: Right;  . I & D EXTREMITY Right 10/20/2015   Procedure: REPEAT IRRIGATION AND DEBRIDEMENT EXTREMITY, right lower leg;  Surgeon: Mcarthur Rossetti, MD;  Location: [place];  Service: Orthopedics;  Laterality: Right;  . KNEE SURGERY     left knee and right knee  . LEFT HEART CATH AND CORONARY ANGIOGRAPHY N/A 06/27/2018   Procedure: LEFT HEART CATH AND CORONARY ANGIOGRAPHY;  Surgeon: Martinique, Peter M, MD;  Location: [facility];  Service: Cardiovascular;  Laterality: N/A;  . LEFT HEART CATH AND CORONARY ANGIOGRAPHY N/A 12/12/2019   Procedure: LEFT HEART CATH AND CORONARY ANGIOGRAPHY;  Surgeon: Wellington Hampshire, MD;  Location: [facility];  Service: Cardiovascular;  Laterality: N/A;  . MASS EXCISION Left 09/22/2015   Procedure: EXCISION MASS;  Surgeon: Mcarthur Rossetti, MD;  Location:  [place];  Service: Orthopedics;  Laterality: Left;  . ORIF TIBIA PLATEAU  02/07/2012   Procedure: OPEN REDUCTION INTERNAL FIXATION (ORIF) TIBIAL PLATEAU;  Surgeon: Rozanna Box, MD;  Location: [place];  Service: Orthopedics;  Laterality: Left;  ORIF LEFT TIBIAL PLATEAU/ REMOVE EXTERNAL FIXATION LEFT LEG  . ORIF TIBIA PLATEAU Right 09/22/2015   Procedure: REMOVAL OF EXTERNAL FIXATOR RIGHT LEG, OPEN REDUCTION INTERNAL FIXATION (ORIF) RIGHT TIBIAL PLATEAU, EXCISION SMALL MASS LEFT LEG;  Surgeon: Mcarthur Rossetti, MD;  Location: [place];  Service: Orthopedics;  Laterality: Right;  . SKIN SPLIT GRAFT Right 10/23/2015   Procedure: RIGHT GASTROC FLAP SPLIT THICKNESS SKIN GRAFT FROM RIGHT THING TO RIGHT LEG;  Surgeon: Irene Limbo, MD;  Location: [place];  Service: Plastics;  Laterality: Right;  . TEE WITHOUT CARDIOVERSION N/A 12/16/2019   Procedure: TRANSESOPHAGEAL ECHOCARDIOGRAM (TEE);  Surgeon: Lajuana Matte, MD;  Location: [place];  Service: Open Heart Surgery;  Laterality: N/A;  .  WRIST SURGERY     left    MEDICATIONS: No current facility-administered medications for this encounter.   Marland Kitchen albuterol (PROAIR HFA) 108 (90 Base) MCG/ACT inhaler  . aspirin EC 81 MG tablet  . fluticasone (FLONASE) 50 MCG/ACT nasal spray  . guaiFENesin (MUCINEX) 600 MG 12 hr tablet  . losartan (COZAAR) 25 MG tablet  . metoprolol tartrate (LOPRESSOR) 25 MG tablet  . montelukast (SINGULAIR) 10 MG tablet  . nitroGLYCERIN (NITROSTAT) 0.4 MG SL tablet  . pantoprazole (PROTONIX) 40 MG tablet  . rosuvastatin (CRESTOR) 40 MG tablet  . umeclidinium-vilanterol (ANORO ELLIPTA) 62.5-25 MCG/INH AEPB    Myra Gianotti, PA-C Surgical Short Stay/Anesthesiology [facility] Phone [phone] [facility] Phone [phone] 03/02/2020 2:11 PM

## 2020-03-02 NOTE — Telephone Encounter (Signed)
We might want to get this rescheduled Will route to [facility] for follow up

## 2020-03-03 ENCOUNTER — Ambulatory Visit (HOSPITAL_COMMUNITY): Admission: RE | Admit: 2020-03-03 | Payer: Medicare Other | Source: Home / Self Care | Admitting: Pulmonary Disease

## 2020-03-03 SURGERY — VIDEO BRONCHOSCOPY WITH ENDOBRONCHIAL NAVIGATION
Anesthesia: General | Laterality: Right

## 2020-03-04 ENCOUNTER — Encounter: Payer: Self-pay | Admitting: Family Medicine

## 2020-03-04 DIAGNOSIS — H35013 Changes in retinal vascular appearance, bilateral: Secondary | ICD-10-CM | POA: Diagnosis not present

## 2020-03-04 DIAGNOSIS — H25013 Cortical age-related cataract, bilateral: Secondary | ICD-10-CM | POA: Diagnosis not present

## 2020-03-04 DIAGNOSIS — H35033 Hypertensive retinopathy, bilateral: Secondary | ICD-10-CM | POA: Diagnosis not present

## 2020-03-04 DIAGNOSIS — H2513 Age-related nuclear cataract, bilateral: Secondary | ICD-10-CM | POA: Diagnosis not present

## 2020-03-05 ENCOUNTER — Inpatient Hospital Stay: Admission: RE | Admit: 2020-03-05 | Payer: Medicare Other | Source: Ambulatory Visit

## 2020-03-06 ENCOUNTER — Other Ambulatory Visit: Payer: Self-pay | Admitting: Family Medicine

## 2020-03-17 ENCOUNTER — Encounter (HOSPITAL_COMMUNITY): Payer: Self-pay | Admitting: Emergency Medicine

## 2020-03-17 ENCOUNTER — Emergency Department (HOSPITAL_COMMUNITY)
Admission: EM | Admit: 2020-03-17 | Discharge: 2020-03-17 | Disposition: A | Discharge status: Home / Self Care | Payer: Medicare Other | Attending: Emergency Medicine | Admitting: Emergency Medicine

## 2020-03-17 DIAGNOSIS — Z951 Presence of aortocoronary bypass graft: Secondary | ICD-10-CM | POA: Insufficient documentation

## 2020-03-17 DIAGNOSIS — R0600 Dyspnea, unspecified: Secondary | ICD-10-CM | POA: Insufficient documentation

## 2020-03-17 DIAGNOSIS — J449 Chronic obstructive pulmonary disease, unspecified: Secondary | ICD-10-CM | POA: Diagnosis not present

## 2020-03-17 DIAGNOSIS — I252 Old myocardial infarction: Secondary | ICD-10-CM | POA: Insufficient documentation

## 2020-03-17 DIAGNOSIS — R000 Tachycardia, unspecified: Secondary | ICD-10-CM | POA: Diagnosis not present

## 2020-03-17 DIAGNOSIS — Z79899 Other long term (current) drug therapy: Secondary | ICD-10-CM | POA: Diagnosis not present

## 2020-03-17 DIAGNOSIS — R0689 Other abnormalities of breathing: Secondary | ICD-10-CM | POA: Diagnosis not present

## 2020-03-17 DIAGNOSIS — I2510 Atherosclerotic heart disease of native coronary artery without angina pectoris: Secondary | ICD-10-CM | POA: Insufficient documentation

## 2020-03-17 DIAGNOSIS — F17210 Nicotine dependence, cigarettes, uncomplicated: Secondary | ICD-10-CM | POA: Diagnosis not present

## 2020-03-17 DIAGNOSIS — Z743 Need for continuous supervision: Secondary | ICD-10-CM | POA: Diagnosis not present

## 2020-03-17 DIAGNOSIS — Z9889 Other specified postprocedural states: Secondary | ICD-10-CM | POA: Insufficient documentation

## 2020-03-17 DIAGNOSIS — R040 Epistaxis: Secondary | ICD-10-CM | POA: Insufficient documentation

## 2020-03-17 DIAGNOSIS — Z7982 Long term (current) use of aspirin: Secondary | ICD-10-CM | POA: Diagnosis not present

## 2020-03-17 DIAGNOSIS — R58 Hemorrhage, not elsewhere classified: Secondary | ICD-10-CM | POA: Diagnosis not present

## 2020-03-17 DIAGNOSIS — R0602 Shortness of breath: Secondary | ICD-10-CM | POA: Diagnosis not present

## 2020-03-17 LAB — CBC WITH DIFFERENTIAL/PLATELET
Abs Immature Granulocytes: 0.01 10*3/uL (ref 0.00–0.07)
Basophils Absolute: 0 10*3/uL (ref 0.0–0.1)
Basophils Relative: 0 %
Eosinophils Absolute: 0 10*3/uL (ref 0.0–0.5)
Eosinophils Relative: 1 %
HCT: 38.6 % — ABNORMAL LOW (ref 39.0–52.0)
Hemoglobin: 12.6 g/dL — ABNORMAL LOW (ref 13.0–17.0)
Immature Granulocytes: 0 %
Lymphocytes Relative: 23 %
Lymphs Abs: 0.6 10*3/uL — ABNORMAL LOW (ref 0.7–4.0)
MCH: 29.7 pg (ref 26.0–34.0)
MCHC: 32.6 g/dL (ref 30.0–36.0)
MCV: 91 fL (ref 80.0–100.0)
Monocytes Absolute: 0.3 10*3/uL (ref 0.1–1.0)
Monocytes Relative: 9 %
Neutro Abs: 1.9 10*3/uL (ref 1.7–7.7)
Neutrophils Relative %: 67 %
Platelets: 171 10*3/uL (ref 150–400)
RBC: 4.24 MIL/uL (ref 4.22–5.81)
RDW: 15.7 % — ABNORMAL HIGH (ref 11.5–15.5)
WBC: 2.8 10*3/uL — ABNORMAL LOW (ref 4.0–10.5)
nRBC: 0 % (ref 0.0–0.2)

## 2020-03-17 LAB — BASIC METABOLIC PANEL
Anion gap: 16 — ABNORMAL HIGH (ref 5–15)
BUN: 5 mg/dL — ABNORMAL LOW (ref 8–23)
CO2: 26 mmol/L (ref 22–32)
Calcium: 9.5 mg/dL (ref 8.9–10.3)
Chloride: 95 mmol/L — ABNORMAL LOW (ref 98–111)
Creatinine, Ser: 0.7 mg/dL (ref 0.61–1.24)
GFR calc Af Amer: >60 mL/min (ref >60)
GFR calc non Af Amer: >60 mL/min (ref >60)
Glucose, Bld: 83 mg/dL (ref 70–99)
Potassium: 4 mmol/L (ref 3.5–5.1)
Sodium: 137 mmol/L (ref 135–145)

## 2020-03-17 LAB — PROTIME-INR
INR: 1 (ref 0.8–1.2)
Prothrombin Time: 13 seconds (ref 11.4–15.2)

## 2020-03-17 MED ORDER — OXYMETAZOLINE HCL 0.05 % NA SOLN
2.0000 | Freq: Once | NASAL | Status: AC
  Administered 2020-03-17: 2 via NASAL
  Filled 2020-03-17: qty 30

## 2020-03-17 NOTE — ED Notes (Signed)
Nose still oozing from left side of nostril pressure held  MD notified

## 2020-03-17 NOTE — Discharge Instructions (Addendum)
Call the ENT clinic to arrange an appointment in the next 3 days.  The contact information for [facility] has been provided in this discharge summary for you to call and make these arrangements.  You should use a humidifier in your home.  Also use Ocean nasal spray and apply Vaseline to both nostrils several times daily.  If bleeding resumes, hold persistent direct pressure by pinching her nostrils closed for 15 minutes.  If this does not improve your bleeding, return to the ER to be reevaluated.

## 2020-03-17 NOTE — ED Provider Notes (Signed)
[facility] EMERGENCY DEPARTMENT Provider Note   CSN: [id number] Arrival date & time: 03/17/20  7824     History No chief complaint on file.   Lucas [name] is a 69 y.o. male.  Patient is a 69 year old male with past medical history of coronary artery disease status post CABG several weeks ago, hypertension, hyperlipidemia, tobacco use, and upper GI bleed.  He presents today for evaluation of nosebleed and difficulty breathing.  Patient has been on oxygen since discharge from his CABG surgery.  For the past several mornings, he is woken up with bleeding from his nose.  This morning it became considerably worse and began running down the back of his throat.  He is having difficulty breathing and 911 was called.  The nosebleed has since resolved and the patient's breathing has improved.  He denies any chest pain.  He denies any fevers or chills.  He denies any injury or trauma to his nose.  The history is provided by the patient.       Past Medical History:  Diagnosis Date  . Acute blood loss anemia 06/21/2018  . Acute upper GI bleed 06/21/2018  . Allergy   . Bilateral AVN of femurs ([place]) 06/25/2018   See 11/2017 Chest CT report  . COPD 12/21/2006  . Environmental allergies   . History of femur fracture 06/25/2018   11/2017 CT AP showed Remote nonunited right femur greater trochanter fracture  . History of Fracture of right tibia 06/26/2018  . History of multiple right thoacic rib fractures 06/25/2018  . History of Right clavicular fracture 06/25/2018  . HYPERLIPIDEMIA 12/21/2006  . HYPERTENSION, BENIGN SYSTEMIC 12/21/2006  . Mallory-Weiss tear   . TOBACCO DEPENDENCE 12/21/2006    Patient Active Problem List   Diagnosis Date Noted  . Pulmonary nodule 02/10/2020  . Pleural effusion   . S/P CABG x 4 12/16/2019  . Coronary artery disease 12/16/2019  . Unstable angina ([place]) 12/12/2019  . NSTEMI (non-ST elevated myocardial infarction) ([place]) 12/12/2019  . CAD (coronary  artery disease) 07/11/2018  . H. pylori infection 07/03/2018  . Chronic stable angina ([place]) 07/03/2018  . Duodenal ulcer due to Helicobacter pylori   . History of Right clavicular fracture 06/25/2018  . Bilateral AVN of femurs ([place]) 06/25/2018  . Chronic cough 06/25/2018  . Mallory-Weiss tear   . Gastritis and gastroduodenitis   . Cirrhosis of liver ([place])   . Tubular adenoma of colon 01/24/2017  . Ganglion cyst 01/03/2012  . PERIPHERAL NEUROPATHY 07/15/2010  . Alcohol use disorder, severe, dependence ([place]) 05/14/2009  . DEFORMITY, FOOT NEC, CONGENITAL 08/22/2007  . ALLERGIC RHINITIS, SEASONAL 05/03/2007  . Hyperlipidemia 12/21/2006  . Tobacco abuse 12/21/2006  . COPD with chronic bronchitis ([place]) 12/21/2006    Past Surgical History:  Procedure Laterality Date  . ANKLE SURGERY     left ankle  . APPLICATION OF WOUND VAC  01/06/2012   Procedure: APPLICATION OF WOUND VAC;  Surgeon: Meredith Pel, MD;  Location: [facility];  Service: Orthopedics;  Laterality: Left;  . APPLICATION OF WOUND VAC Right 10/17/2015   Procedure: APPLICATION OF WOUND VAC;  Surgeon: Leandrew Koyanagi, MD;  Location: [place];  Service: Orthopedics;  Laterality: Right;  . BIOPSY  06/22/2018   Procedure: BIOPSY;  Surgeon: [id number] v, DO;  Location: [place] [facility];  Service: Gastroenterology;;  . CORONARY ARTERY BYPASS GRAFT N/A 12/16/2019   Procedure: CORONARY ARTERY BYPASS GRAFTING (CABG) TIMES 4 USING LEFT INTERNAL MAMMARY ARTERY AND ENDOSCOPICALLY HARVESTED RIGHT SAPHENOUS  VEIN;  Surgeon: Lajuana Matte, MD;  Location: [place];  Service: Open Heart Surgery;  Laterality: N/A;  . ESOPHAGOGASTRODUODENOSCOPY (EGD) WITH PROPOFOL N/A 06/22/2018   Procedure: ESOPHAGOGASTRODUODENOSCOPY (EGD) WITH PROPOFOL;  Surgeon: [id number] v, DO;  Location: [place] [facility];  Service: Gastroenterology;  Laterality: N/A;  . EXTERNAL FIXATION LEG  01/06/2012   Procedure: EXTERNAL FIXATION LEG;  Surgeon: Meredith Pel, MD;   Location: [facility];  Service: Orthopedics;  Laterality: Left;  Smith-nephew external fixator set  . EXTERNAL FIXATION LEG Right 09/16/2015   Procedure: EXTERNAL FIXATION LEG/LARGE;  Surgeon: Mcarthur Rossetti, MD;  Location: [place];  Service: Orthopedics;  Laterality: Right;  . EXTERNAL FIXATION REMOVAL Right 09/22/2015   Procedure: REMOVAL EXTERNAL FIXATION LEG;  Surgeon: Mcarthur Rossetti, MD;  Location: [place];  Service: Orthopedics;  Laterality: Right;  . FASCIOTOMY  01/10/2012   Procedure: FASCIOTOMY;  Surgeon: Rozanna Box, MD;  Location: [place];  Service: Orthopedics;  Laterality: Left;  status post fasciotomy with wound vac left calf; adjustment external fixator left tibia under fluoro; retention suture/wound vac placement left lateral calf wound  . FASCIOTOMY  01/06/2012   Procedure: FASCIOTOMY;  Surgeon: Meredith Pel, MD;  Location: [facility];  Service: Orthopedics;  Laterality: Left;  Four compartment fasciotomy  . HOT HEMOSTASIS N/A 06/22/2018   Procedure: HEMOSTASIS;  Surgeon: [id number] v, DO;  Location: [facility];  Service: Gastroenterology;  Laterality: N/A;  Epinepherine and clip placed  . I & D EXTREMITY  01/12/2012   Procedure: IRRIGATION AND DEBRIDEMENT EXTREMITY;  Surgeon: Rozanna Box, MD;  Location: [place];  Service: Orthopedics;  Laterality: Left;  LAYERD CLOSURE LEFT LEG WOUND  . I & D EXTREMITY Right 10/17/2015   Procedure: IRRIGATION AND DEBRIDEMENT EXTREMITY;  Surgeon: Leandrew Koyanagi, MD;  Location: [place];  Service: Orthopedics;  Laterality: Right;  . I & D EXTREMITY Right 10/20/2015   Procedure: REPEAT IRRIGATION AND DEBRIDEMENT EXTREMITY, right lower leg;  Surgeon: Mcarthur Rossetti, MD;  Location: [place];  Service: Orthopedics;  Laterality: Right;  . KNEE SURGERY     left knee and right knee  . LEFT HEART CATH AND CORONARY ANGIOGRAPHY N/A 06/27/2018   Procedure: LEFT HEART CATH AND CORONARY ANGIOGRAPHY;  Surgeon: Martinique, Peter M, MD;  Location: [place] [facility];  Service: Cardiovascular;  Laterality: N/A;  . LEFT HEART CATH AND CORONARY ANGIOGRAPHY N/A 12/12/2019   Procedure: LEFT HEART CATH AND CORONARY ANGIOGRAPHY;  Surgeon: Wellington Hampshire, MD;  Location: [place] [facility];  Service: Cardiovascular;  Laterality: N/A;  . MASS EXCISION Left 09/22/2015   Procedure: EXCISION MASS;  Surgeon: Mcarthur Rossetti, MD;  Location: [place];  Service: Orthopedics;  Laterality: Left;  . ORIF TIBIA PLATEAU  02/07/2012   Procedure: OPEN REDUCTION INTERNAL FIXATION (ORIF) TIBIAL PLATEAU;  Surgeon: Rozanna Box, MD;  Location: [place];  Service: Orthopedics;  Laterality: Left;  ORIF LEFT TIBIAL PLATEAU/ REMOVE EXTERNAL FIXATION LEFT LEG  . ORIF TIBIA PLATEAU Right 09/22/2015   Procedure: REMOVAL OF EXTERNAL FIXATOR RIGHT LEG, OPEN REDUCTION INTERNAL FIXATION (ORIF) RIGHT TIBIAL PLATEAU, EXCISION SMALL MASS LEFT LEG;  Surgeon: Mcarthur Rossetti, MD;  Location: [place];  Service: Orthopedics;  Laterality: Right;  . SKIN SPLIT GRAFT Right 10/23/2015   Procedure: RIGHT GASTROC FLAP SPLIT THICKNESS SKIN GRAFT FROM RIGHT THING TO RIGHT LEG;  Surgeon: Irene Limbo, MD;  Location: [place];  Service: Plastics;  Laterality: Right;  . TEE WITHOUT CARDIOVERSION N/A 12/16/2019  Procedure: TRANSESOPHAGEAL ECHOCARDIOGRAM (TEE);  Surgeon: Lajuana Matte, MD;  Location: [place];  Service: Open Heart Surgery;  Laterality: N/A;  . WRIST SURGERY     left       Family History  Problem Relation Age of Onset  . Emphysema Father     Social History   Tobacco Use  . Smoking status: Current Every Day Smoker    Packs/day: 3.00    Years: 51.00    Pack years: 153.00    Types: Cigarettes  . Smokeless tobacco: Former Systems developer    Types: Snuff, Chew    Quit date: 01/05/1965  . Tobacco comment: currently smoking 1ppd as of 02/12/20  Substance Use Topics  . Alcohol use: Yes    Alcohol/week: 49.0 standard drinks    Types: 49 Cans of beer per week    Comment:  drinks about a 6-pack/day  . Drug use: No    Home Medications Prior to Admission medications   Medication Sig Start Date End Date Taking? Authorizing Provider  albuterol (PROAIR HFA) 108 (90 Base) MCG/ACT inhaler INHALE 2 PUFFS BY MOUTH EVERY 4 HOURS AS NEEDED FOR WHEEZE OR FOR SHORTNESS OF BREATH 02/08/20   Brimage, Ronnette Juniper, DO  ANORO ELLIPTA 62.5-25 MCG/INH AEPB INHALE 1 PUFF INTO THE LUNGS DAILY 03/06/20   [name], DO  aspirin EC 81 MG tablet Take 81 mg by mouth daily.    [provider]  fluticasone (FLONASE) 50 MCG/ACT nasal spray SPRAY 2 SPRAYS INTO EACH NOSTRIL EVERY DAY 07/19/18   Steve Rattler, DO  guaiFENesin (MUCINEX) 600 MG 12 hr tablet Take 1 tablet (600 mg total) by mouth 2 (two) times daily as needed. 12/23/19   Barrett, Erin R, PA-C  losartan (COZAAR) 25 MG tablet Take 0.5 tablets (12.5 mg total) by mouth daily. 12/23/19   Barrett, Erin R, PA-C  metoprolol tartrate (LOPRESSOR) 25 MG tablet TAKE 1.5 TABLETS (37.5 MG TOTAL) BY MOUTH 2 (TWO) TIMES DAILY. 12/27/19   Lightfoot, Lucile Crater, MD  montelukast (SINGULAIR) 10 MG tablet TAKE 1 TABLET BY MOUTH EVERYDAY AT BEDTIME 11/27/19   Brimage, Vondra, DO  nitroGLYCERIN (NITROSTAT) 0.4 MG SL tablet PLACE 1 TABLET (0.4 MG TOTAL) UNDER THE TONGUE EVERY 5 (FIVE) MINUTES AS NEEDED FOR CHEST PAIN. 03/02/20   Brimage, Ronnette Juniper, DO  pantoprazole (PROTONIX) 40 MG tablet Take 1 tablet (40 mg total) by mouth 2 (two) times daily. Patient not taking: Reported on 02/04/2020 07/03/18 01/02/20  Martyn Malay, MD  rosuvastatin (CRESTOR) 40 MG tablet Take 1 tablet (40 mg total) by mouth daily. 02/08/20   Brimage, Ronnette Juniper, DO  loratadine (CLARITIN) 10 MG tablet Take 1 tablet (10 mg total) by mouth daily. 03/04/11 05/23/18  Mariana Arn, MD  metoprolol tartrate (LOPRESSOR) 25 MG tablet Take 1 tablet (25 mg total) by mouth 2 (two) times daily. 05/29/19   [name], DO    Allergies    Ace inhibitors, Ampicillin, Penicillins, Codeine, and  Lisinopril  Review of Systems   Review of Systems  All other systems reviewed and are negative.   Physical Exam Updated Vital Signs There were no vitals taken for this visit.  Physical Exam Vitals and nursing note reviewed.  Constitutional:      General: He is not in acute distress.    Appearance: He is well-developed. He is not diaphoretic.  HENT:     Head: Normocephalic and atraumatic.     Nose:     Comments: There is some clotted blood in the  left nares and dried blood in the right.  There is no active bleeding.  Posterior oropharynx is clear. Cardiovascular:     Rate and Rhythm: Normal rate and regular rhythm.     Heart sounds: No murmur. No friction rub.  Pulmonary:     Effort: Pulmonary effort is normal. No respiratory distress.     Breath sounds: Normal breath sounds. No wheezing or rales.  Abdominal:     General: Bowel sounds are normal. There is no distension.     Palpations: Abdomen is soft.     Tenderness: There is no abdominal tenderness.  Musculoskeletal:        General: Normal range of motion.     Cervical back: Normal range of motion and neck supple.  Skin:    General: Skin is warm and dry.  Neurological:     Mental Status: He is alert and oriented to person, place, and time.     Coordination: Coordination normal.     ED Results / Procedures / Treatments   Labs (all labs ordered are listed, but only abnormal results are displayed) Labs Reviewed  BASIC METABOLIC PANEL  CBC WITH DIFFERENTIAL/PLATELET  PROTIME-INR    EKG None  Radiology No results found.  Procedures Procedures (including critical care time)  Medications Ordered in ED Medications  oxymetazoline (AFRIN) 0.05 % nasal spray 2 spray (has no administration in time range)    ED Course  I have reviewed the triage vital signs and the nursing notes.  Pertinent labs & imaging results that were available during my care of the patient were reviewed by me and considered in my medical  decision making (see chart for details).    MDM Rules/Calculators/A&P   Patient is a 69 year old male with history of coronary artery disease status post CABG several weeks ago.  Presents today for evaluation of nosebleed.  This began in the absence of any injury or trauma.  Patient has been on oxygen at home since his surgery and I suspect the cause of the bleeding is dry mucous membranes.  Patient was given Afrin nasal spray and observed.  He did have recurrent bleeding, then a Rhino Rocket was placed.  He has been observed for several hours and has had an intermittent slight trickle of blood, however no persistent or heavy bleeding.  His laboratory studies show a hemoglobin of 12.6 with platelet count of 171.  His INR is normal.  At this point, I see no indication for admission.  I will discharge the patient with a nasal packing in place and have him follow-up with ENT in the next few days.  He is to use a humidifier at home as well as Ocean nasal spray and Vaseline.  Final Clinical Impression(s) / ED Diagnoses Final diagnoses:  None    Rx / DC Orders ED Discharge Orders    None       Veryl Speak, MD 03/17/20 1331

## 2020-03-17 NOTE — ED Triage Notes (Signed)
Pt here from home with c/o resp  distress along with a nose bleed , pt arrived to ED on A NRB sats 100 % , nose bleed stopped , pt is on otc n/c 3 liters and still smokes

## 2020-03-25 ENCOUNTER — Ambulatory Visit: Payer: Medicare Other | Admitting: Primary Care

## 2020-03-25 DIAGNOSIS — I2510 Atherosclerotic heart disease of native coronary artery without angina pectoris: Secondary | ICD-10-CM | POA: Diagnosis not present

## 2020-03-25 DIAGNOSIS — J90 Pleural effusion, not elsewhere classified: Secondary | ICD-10-CM | POA: Diagnosis not present

## 2020-03-25 DIAGNOSIS — J449 Chronic obstructive pulmonary disease, unspecified: Secondary | ICD-10-CM | POA: Diagnosis not present

## 2020-04-01 ENCOUNTER — Other Ambulatory Visit: Payer: Self-pay

## 2020-04-01 ENCOUNTER — Emergency Department (HOSPITAL_COMMUNITY)
Admission: EM | Admit: 2020-04-01 | Discharge: 2020-04-02 | Disposition: A | Discharge status: Home / Self Care | Payer: Medicare Other | Attending: Emergency Medicine | Admitting: Emergency Medicine

## 2020-04-01 ENCOUNTER — Encounter (HOSPITAL_COMMUNITY): Payer: Self-pay | Admitting: Emergency Medicine

## 2020-04-01 DIAGNOSIS — Z951 Presence of aortocoronary bypass graft: Secondary | ICD-10-CM | POA: Diagnosis not present

## 2020-04-01 DIAGNOSIS — R040 Epistaxis: Secondary | ICD-10-CM | POA: Diagnosis not present

## 2020-04-01 DIAGNOSIS — I10 Essential (primary) hypertension: Secondary | ICD-10-CM | POA: Diagnosis not present

## 2020-04-01 DIAGNOSIS — J449 Chronic obstructive pulmonary disease, unspecified: Secondary | ICD-10-CM | POA: Insufficient documentation

## 2020-04-01 DIAGNOSIS — F17210 Nicotine dependence, cigarettes, uncomplicated: Secondary | ICD-10-CM | POA: Diagnosis not present

## 2020-04-01 DIAGNOSIS — Z79899 Other long term (current) drug therapy: Secondary | ICD-10-CM | POA: Insufficient documentation

## 2020-04-01 DIAGNOSIS — R58 Hemorrhage, not elsewhere classified: Secondary | ICD-10-CM | POA: Diagnosis not present

## 2020-04-01 DIAGNOSIS — I2510 Atherosclerotic heart disease of native coronary artery without angina pectoris: Secondary | ICD-10-CM | POA: Diagnosis not present

## 2020-04-01 DIAGNOSIS — I252 Old myocardial infarction: Secondary | ICD-10-CM | POA: Insufficient documentation

## 2020-04-01 LAB — CBC WITH DIFFERENTIAL/PLATELET
Abs Immature Granulocytes: 0 10*3/uL (ref 0.00–0.07)
Basophils Absolute: 0 10*3/uL (ref 0.0–0.1)
Basophils Relative: 0 %
Eosinophils Absolute: 0 10*3/uL (ref 0.0–0.5)
Eosinophils Relative: 0 %
HCT: 37.7 % — ABNORMAL LOW (ref 39.0–52.0)
Hemoglobin: 12.2 g/dL — ABNORMAL LOW (ref 13.0–17.0)
Immature Granulocytes: 0 %
Lymphocytes Relative: 21 %
Lymphs Abs: 0.6 10*3/uL — ABNORMAL LOW (ref 0.7–4.0)
MCH: 29.6 pg (ref 26.0–34.0)
MCHC: 32.4 g/dL (ref 30.0–36.0)
MCV: 91.5 fL (ref 80.0–100.0)
Monocytes Absolute: 0.4 10*3/uL (ref 0.1–1.0)
Monocytes Relative: 12 %
Neutro Abs: 1.9 10*3/uL (ref 1.7–7.7)
Neutrophils Relative %: 67 %
Platelets: 145 10*3/uL — ABNORMAL LOW (ref 150–400)
RBC: 4.12 MIL/uL — ABNORMAL LOW (ref 4.22–5.81)
RDW: 16.8 % — ABNORMAL HIGH (ref 11.5–15.5)
WBC: 2.9 10*3/uL — ABNORMAL LOW (ref 4.0–10.5)
nRBC: 0 % (ref 0.0–0.2)

## 2020-04-01 LAB — BASIC METABOLIC PANEL
Anion gap: 17 — ABNORMAL HIGH (ref 5–15)
BUN: 5 mg/dL — ABNORMAL LOW (ref 8–23)
CO2: 23 mmol/L (ref 22–32)
Calcium: 9.3 mg/dL (ref 8.9–10.3)
Chloride: 98 mmol/L (ref 98–111)
Creatinine, Ser: 0.62 mg/dL (ref 0.61–1.24)
GFR calc Af Amer: >60 mL/min (ref >60)
GFR calc non Af Amer: >60 mL/min (ref >60)
Glucose, Bld: 85 mg/dL (ref 70–99)
Potassium: 4.1 mmol/L (ref 3.5–5.1)
Sodium: 138 mmol/L (ref 135–145)

## 2020-04-01 MED ORDER — ONDANSETRON 4 MG PO TBDP
4.0000 mg | ORAL_TABLET | Freq: Once | ORAL | Status: AC
  Administered 2020-04-01: 4 mg via ORAL
  Filled 2020-04-01: qty 1

## 2020-04-01 NOTE — ED Notes (Signed)
The pt is waiting on ptar

## 2020-04-01 NOTE — ED Notes (Signed)
Called ptar for transport  

## 2020-04-01 NOTE — Social Work (Signed)
CSW confirmed with Charlynn Grimes that home oxygen was set up for delivery.  CSW informed Pt at bedside.

## 2020-04-01 NOTE — ED Notes (Signed)
Pt up to the br to get dressed  He became very sob  He needs 02 to get home  ptar called to take him home

## 2020-04-01 NOTE — ED Provider Notes (Signed)
[facility] EMERGENCY DEPARTMENT Provider Note   CSN: [id number] Arrival date & time: 04/01/20  1305     History Chief Complaint  Patient presents with   Epistaxis    Lucas [name] is a 69 y.o. male.  The history is provided by the patient. No language interpreter was used.  Epistaxis Location:  L nare Severity:  Moderate Duration:  1 day Timing:  Constant Progression:  Worsening Chronicity:  Recurrent Context: home oxygen   Relieved by:  Nothing Worsened by:  Nothing Ineffective treatments:  None tried Associated symptoms: blood in oropharynx and congestion    Pt was seen here on 6/1 for a nosebleed.  Pt reports bleeding stopped and packing came out.     Past Medical History:  Diagnosis Date   Acute blood loss anemia 06/21/2018   Acute upper GI bleed 06/21/2018   Allergy    Bilateral AVN of femurs ([place]) 06/25/2018   See 11/2017 Chest CT report   COPD 12/21/2006   Environmental allergies    History of femur fracture 06/25/2018   11/2017 CT AP showed Remote nonunited right femur greater trochanter fracture   History of Fracture of right tibia 06/26/2018   History of multiple right thoacic rib fractures 06/25/2018   History of Right clavicular fracture 06/25/2018   HYPERLIPIDEMIA 12/21/2006   HYPERTENSION, BENIGN SYSTEMIC 12/21/2006   Mallory-Weiss tear    TOBACCO DEPENDENCE 12/21/2006    Patient Active Problem List   Diagnosis Date Noted   Pulmonary nodule 02/10/2020   Pleural effusion    S/P CABG x 4 12/16/2019   Coronary artery disease 12/16/2019   Unstable angina ([place]) 12/12/2019   NSTEMI (non-ST elevated myocardial infarction) ([place]) 12/12/2019   CAD (coronary artery disease) 07/11/2018   H. pylori infection 07/03/2018   Chronic stable angina ([place]) 07/03/2018   Duodenal ulcer due to Helicobacter pylori    History of Right clavicular fracture 06/25/2018   Bilateral AVN of femurs ([place]) 06/25/2018   Chronic cough 06/25/2018    Mallory-Weiss tear    Gastritis and gastroduodenitis    Cirrhosis of liver ([place])    Tubular adenoma of colon 01/24/2017   Ganglion cyst 01/03/2012   PERIPHERAL NEUROPATHY 07/15/2010   Alcohol use disorder, severe, dependence ([place]) 05/14/2009   DEFORMITY, FOOT NEC, CONGENITAL 08/22/2007   ALLERGIC RHINITIS, SEASONAL 05/03/2007   Hyperlipidemia 12/21/2006   Tobacco abuse 12/21/2006   COPD with chronic bronchitis ([place]) 12/21/2006    Past Surgical History:  Procedure Laterality Date   ANKLE SURGERY     left ankle   APPLICATION OF WOUND VAC  01/06/2012   Procedure: APPLICATION OF WOUND VAC;  Surgeon: Meredith Pel, MD;  Location: [facility];  Service: Orthopedics;  Laterality: Left;   APPLICATION OF WOUND VAC Right 10/17/2015   Procedure: APPLICATION OF WOUND VAC;  Surgeon: Leandrew Koyanagi, MD;  Location: [place];  Service: Orthopedics;  Laterality: Right;   BIOPSY  06/22/2018   Procedure: BIOPSY;  Surgeon: [id number] v, DO;  Location: [place] [facility];  Service: Gastroenterology;;   CORONARY ARTERY BYPASS GRAFT N/A 12/16/2019   Procedure: CORONARY ARTERY BYPASS GRAFTING (CABG) TIMES 4 USING LEFT INTERNAL MAMMARY ARTERY AND ENDOSCOPICALLY HARVESTED RIGHT SAPHENOUS VEIN;  Surgeon: Lajuana Matte, MD;  Location: [place];  Service: Open Heart Surgery;  Laterality: N/A;   ESOPHAGOGASTRODUODENOSCOPY (EGD) WITH PROPOFOL N/A 06/22/2018   Procedure: ESOPHAGOGASTRODUODENOSCOPY (EGD) WITH PROPOFOL;  Surgeon: [id number] v, DO;  Location: [place] [facility];  Service: Gastroenterology;  Laterality: N/A;   EXTERNAL  FIXATION LEG  01/06/2012   Procedure: EXTERNAL FIXATION LEG;  Surgeon: Meredith Pel, MD;  Location: [facility];  Service: Orthopedics;  Laterality: Left;  Smith-nephew external fixator set   EXTERNAL FIXATION LEG Right 09/16/2015   Procedure: EXTERNAL FIXATION LEG/LARGE;  Surgeon: Mcarthur Rossetti, MD;  Location: [place];  Service: Orthopedics;  Laterality: Right;     EXTERNAL FIXATION REMOVAL Right 09/22/2015   Procedure: REMOVAL EXTERNAL FIXATION LEG;  Surgeon: Mcarthur Rossetti, MD;  Location: [place];  Service: Orthopedics;  Laterality: Right;   FASCIOTOMY  01/10/2012   Procedure: FASCIOTOMY;  Surgeon: Rozanna Box, MD;  Location: [place];  Service: Orthopedics;  Laterality: Left;  status post fasciotomy with wound vac left calf; adjustment external fixator left tibia under fluoro; retention suture/wound vac placement left lateral calf wound   FASCIOTOMY  01/06/2012   Procedure: FASCIOTOMY;  Surgeon: Meredith Pel, MD;  [facility];  Service: Orthopedics;  Laterality: Left;  Four compartment fasciotomy   HOT HEMOSTASIS N/A 06/22/2018   Procedure: HEMOSTASIS;  Surgeon: [id number] v, DO;  Location: [place];  Service: Gastroenterology;  Laterality: N/A;  Epinepherine and clip placed   I & D EXTREMITY  01/12/2012   Procedure: IRRIGATION AND DEBRIDEMENT EXTREMITY;  Surgeon: Rozanna Box, MD;  Location: [place];  Service: Orthopedics;  Laterality: Left;  LAYERD CLOSURE LEFT LEG WOUND   I & D EXTREMITY Right 10/17/2015   Procedure: IRRIGATION AND DEBRIDEMENT EXTREMITY;  Surgeon: Leandrew Koyanagi, MD;  Location: [place];  Service: Orthopedics;  Laterality: Right;   I & D EXTREMITY Right 10/20/2015   Procedure: REPEAT IRRIGATION AND DEBRIDEMENT EXTREMITY, right lower leg;  Surgeon: Mcarthur Rossetti, MD;  Location: [place];  Service: Orthopedics;  Laterality: Right;   KNEE SURGERY     left knee and right knee   LEFT HEART CATH AND CORONARY ANGIOGRAPHY N/A 06/27/2018   Procedure: LEFT HEART CATH AND CORONARY ANGIOGRAPHY;  Surgeon: Martinique, Peter M, MD;  Location: [place] [facility];  Service: Cardiovascular;  Laterality: N/A;   LEFT HEART CATH AND CORONARY ANGIOGRAPHY N/A 12/12/2019   Procedure: LEFT HEART CATH AND CORONARY ANGIOGRAPHY;  Surgeon: Wellington Hampshire, MD;  Location: [place] [facility];  Service: Cardiovascular;  Laterality:  N/A;   MASS EXCISION Left 09/22/2015   Procedure: EXCISION MASS;  Surgeon: Mcarthur Rossetti, MD;  Location: [place];  Service: Orthopedics;  Laterality: Left;   ORIF TIBIA PLATEAU  02/07/2012   Procedure: OPEN REDUCTION INTERNAL FIXATION (ORIF) TIBIAL PLATEAU;  Surgeon: Rozanna Box, MD;  Location: [place];  Service: Orthopedics;  Laterality: Left;  ORIF LEFT TIBIAL PLATEAU/ REMOVE EXTERNAL FIXATION LEFT LEG   ORIF TIBIA PLATEAU Right 09/22/2015   Procedure: REMOVAL OF EXTERNAL FIXATOR RIGHT LEG, OPEN REDUCTION INTERNAL FIXATION (ORIF) RIGHT TIBIAL PLATEAU, EXCISION SMALL MASS LEFT LEG;  Surgeon: Mcarthur Rossetti, MD;  Location: [place];  Service: Orthopedics;  Laterality: Right;   SKIN SPLIT GRAFT Right 10/23/2015   Procedure: RIGHT GASTROC FLAP SPLIT THICKNESS SKIN GRAFT FROM RIGHT THING TO RIGHT LEG;  Surgeon: Irene Limbo, MD;  Location: [place];  Service: Plastics;  Laterality: Right;   TEE WITHOUT CARDIOVERSION N/A 12/16/2019   Procedure: TRANSESOPHAGEAL ECHOCARDIOGRAM (TEE);  Surgeon: Lajuana Matte, MD;  Location: [place];  Service: Open Heart Surgery;  Laterality: N/A;   WRIST SURGERY     left       Family History  Problem Relation Age of Onset   Emphysema  Father     Social History   Tobacco Use   Smoking status: Current Every Day Smoker    Packs/day: 3.00    Years: 51.00    Pack years: 153.00    Types: Cigarettes   Smokeless tobacco: Former Systems developer    Types: Snuff, Chew    Quit date: 01/05/1965   Tobacco comment: currently smoking 1ppd as of 02/12/20  Substance Use Topics   Alcohol use: Yes    Alcohol/week: 49.0 standard drinks    Types: 49 Cans of beer per week    Comment: drinks about a 6-pack/day   Drug use: No    Home Medications Prior to Admission medications   Medication Sig Start Date End Date Taking? Authorizing Provider  albuterol (PROAIR HFA) 108 (90 Base) MCG/ACT inhaler INHALE 2 PUFFS BY MOUTH EVERY 4 HOURS AS NEEDED FOR WHEEZE OR  FOR SHORTNESS OF BREATH 02/08/20  Yes Brimage, Vondra, DO  ANORO ELLIPTA 62.5-25 MCG/INH AEPB INHALE 1 PUFF INTO THE LUNGS DAILY Patient taking differently: Inhale 1 puff into the lungs daily.  03/06/20  Yes Brimage, Vondra, DO  aspirin EC 81 MG tablet Take 81 mg by mouth daily.   Yes [provider]  fluticasone (FLONASE) 50 MCG/ACT nasal spray SPRAY 2 SPRAYS INTO EACH NOSTRIL EVERY DAY Patient taking differently: Place 1 spray into both nostrils daily as needed for allergies.  07/19/18  Yes Riccio, Angela C, DO  losartan (COZAAR) 25 MG tablet Take 0.5 tablets (12.5 mg total) by mouth daily. 12/23/19  Yes Barrett, Erin R, PA-C  metoprolol tartrate (LOPRESSOR) 25 MG tablet TAKE 1.5 TABLETS (37.5 MG TOTAL) BY MOUTH 2 (TWO) TIMES DAILY. 12/27/19  Yes Lightfoot, Lucile Crater, MD  nitroGLYCERIN (NITROSTAT) 0.4 MG SL tablet PLACE 1 TABLET (0.4 MG TOTAL) UNDER THE TONGUE EVERY 5 (FIVE) MINUTES AS NEEDED FOR CHEST PAIN. 03/02/20  Yes Brimage, Vondra, DO  oxymetazoline (AFRIN) 0.05 % nasal spray Place 1 spray into both nostrils 2 (two) times daily as needed for congestion.   Yes [provider]  rosuvastatin (CRESTOR) 20 MG tablet Take 20 mg by mouth at bedtime. 03/26/20  Yes [provider]  guaiFENesin (MUCINEX) 600 MG 12 hr tablet Take 1 tablet (600 mg total) by mouth 2 (two) times daily as needed. Patient not taking: Reported on 04/01/2020 12/23/19   Barrett, Erin R, PA-C  montelukast (SINGULAIR) 10 MG tablet TAKE 1 TABLET BY MOUTH EVERYDAY AT BEDTIME Patient not taking: Reported on 04/01/2020 11/27/19   [name], DO  pantoprazole (PROTONIX) 40 MG tablet Take 1 tablet (40 mg total) by mouth 2 (two) times daily. Patient not taking: Reported on 02/04/2020 07/03/18 01/02/20  Martyn Malay, MD  rosuvastatin (CRESTOR) 40 MG tablet Take 1 tablet (40 mg total) by mouth daily. Patient not taking: Reported on 04/01/2020 02/08/20   [name], DO  loratadine (CLARITIN) 10 MG tablet Take 1 tablet  (10 mg total) by mouth daily. 03/04/11 05/23/18  Mariana Arn, MD  metoprolol tartrate (LOPRESSOR) 25 MG tablet Take 1 tablet (25 mg total) by mouth 2 (two) times daily. 05/29/19   [name], DO    Allergies    Ace inhibitors, Ampicillin, Penicillins, Codeine, and Lisinopril  Review of Systems   Review of Systems  HENT: Positive for congestion and nosebleeds.   All other systems reviewed and are negative.   Physical Exam Updated Vital Signs BP (!) 145/89 (BP Location: Right Arm)    Pulse 94    Temp 98.7 F (  37.1 C)    Resp 20    Ht 5\' 11"  (1.803 m)    Wt 60 kg    SpO2 94%    BMI 18.45 kg/m   Physical Exam Vitals and nursing note reviewed.  Constitutional:      Appearance: He is well-developed.  HENT:     Head: Normocephalic.     Nose: Nose normal.     Mouth/Throat:     Mouth: Mucous membranes are moist.  Eyes:     Pupils: Pupils are equal, round, and reactive to light.  Cardiovascular:     Rate and Rhythm: Normal rate.  Pulmonary:     Effort: Pulmonary effort is normal.  Abdominal:     General: There is no distension.  Musculoskeletal:        General: Normal range of motion.     Cervical back: Normal range of motion.  Neurological:     General: No focal deficit present.     Mental Status: He is alert and oriented to person, place, and time.  Psychiatric:        Mood and Affect: Mood normal.     ED Results / Procedures / Treatments   Labs (all labs ordered are listed, but only abnormal results are displayed) Labs Reviewed  CBC WITH DIFFERENTIAL/PLATELET - Abnormal; Notable for the following components:      Result Value   WBC 2.9 (*)    RBC 4.12 (*)    Hemoglobin 12.2 (*)    HCT 37.7 (*)    RDW 16.8 (*)    Platelets 145 (*)    Lymphs Abs 0.6 (*)    All other components within normal limits  BASIC METABOLIC PANEL    EKG None  Radiology No results found.  Procedures Procedures (including critical care time)  Medications Ordered in  ED Medications  ondansetron (ZOFRAN-ODT) disintegrating tablet 4 mg (has no administration in time range)    ED Course  I have reviewed the triage vital signs and the nursing notes.  Pertinent labs & imaging results that were available during my care of the patient were reviewed by me and considered in my medical decision making (see chart for details).  Clinical Course as of Apr 02 1503  Wed Apr 01, 2020  1357 69 yo male presenting with left sided epistaxis, was seen in Ed several days ago and had packing placed then, which fell out a few days back.  He began bleeding again today.  Feels lightheaded.  Not on A/C.  Nares packed by PA provider Maaliyah Adolph prior to my assessment.  No active bleeding around packing on my exam.  We'll check hgb, monitor for a bit, and then I anticipate discharge with ENT follow up.  I explained he needs to call their office tomorrow morning (he had had a prior appointment set up with them, but cancelled to come to the ED last time).   [MT]    Clinical Course User Index [MT] Trifan, Carola Rhine, MD   MDM Rules/Calculators/A&P                      MDM:  Hemoglobin is 12.  Pt observed extended period of time.  No further bleeding.  Social work consulted to get humidified home 02  Final Clinical Impression(s) / ED Diagnoses Final diagnoses:  Epistaxis  Left-sided epistaxis    Rx / DC Orders ED Discharge Orders    None       Caryl Ada  K, PA-C 04/01/20 1516    Wyvonnia Dusky, MD 04/01/20 [phone]

## 2020-04-01 NOTE — ED Notes (Signed)
The pt is going to get a taxi  But he needs to see social work about home 02

## 2020-04-01 NOTE — ED Notes (Signed)
The pts nose is not bleeding

## 2020-04-01 NOTE — ED Notes (Signed)
Food given  While he waits for  ptar

## 2020-04-01 NOTE — ED Notes (Signed)
The pt is still waiting for ptar to transport home  ptar backed up   Pt given that information

## 2020-04-01 NOTE — ED Notes (Signed)
The pt was told that we needed a urine  He refuses to use a urinal and he reports that he does not feel like going to the br to try

## 2020-04-01 NOTE — ED Notes (Signed)
The pt is waiting for social work to get him home o2  Not ready to leave before that is done

## 2020-04-01 NOTE — ED Triage Notes (Signed)
To ED via [facility] from home with c/o nosebleed Left nares-- seen here for same a few days ago. Was supposed to follow up with ENT, but nose started bleeding again. Had packing from last visit-- "fell out" after about 6 days.  Now bleeding out right nares

## 2020-04-02 DIAGNOSIS — Z7401 Bed confinement status: Secondary | ICD-10-CM | POA: Diagnosis not present

## 2020-04-02 DIAGNOSIS — M2550 Pain in unspecified joint: Secondary | ICD-10-CM | POA: Diagnosis not present

## 2020-04-02 DIAGNOSIS — R0902 Hypoxemia: Secondary | ICD-10-CM | POA: Diagnosis not present

## 2020-04-02 DIAGNOSIS — Z743 Need for continuous supervision: Secondary | ICD-10-CM | POA: Diagnosis not present

## 2020-04-06 ENCOUNTER — Ambulatory Visit: Payer: Medicare Other | Admitting: Cardiology

## 2020-04-06 DIAGNOSIS — R040 Epistaxis: Secondary | ICD-10-CM | POA: Diagnosis not present

## 2020-04-24 DIAGNOSIS — J90 Pleural effusion, not elsewhere classified: Secondary | ICD-10-CM | POA: Diagnosis not present

## 2020-04-24 DIAGNOSIS — J449 Chronic obstructive pulmonary disease, unspecified: Secondary | ICD-10-CM | POA: Diagnosis not present

## 2020-04-24 DIAGNOSIS — I2510 Atherosclerotic heart disease of native coronary artery without angina pectoris: Secondary | ICD-10-CM | POA: Diagnosis not present

## 2020-04-27 ENCOUNTER — Encounter (HOSPITAL_COMMUNITY): Payer: Self-pay

## 2020-04-27 ENCOUNTER — Emergency Department (HOSPITAL_COMMUNITY)
Admission: EM | Admit: 2020-04-27 | Discharge: 2020-04-27 | Disposition: A | Discharge status: Home / Self Care | Payer: Medicare Other | Attending: Emergency Medicine | Admitting: Emergency Medicine

## 2020-04-27 DIAGNOSIS — Z951 Presence of aortocoronary bypass graft: Secondary | ICD-10-CM | POA: Diagnosis not present

## 2020-04-27 DIAGNOSIS — R58 Hemorrhage, not elsewhere classified: Secondary | ICD-10-CM | POA: Diagnosis not present

## 2020-04-27 DIAGNOSIS — R040 Epistaxis: Secondary | ICD-10-CM | POA: Insufficient documentation

## 2020-04-27 DIAGNOSIS — F17210 Nicotine dependence, cigarettes, uncomplicated: Secondary | ICD-10-CM | POA: Diagnosis not present

## 2020-04-27 DIAGNOSIS — J449 Chronic obstructive pulmonary disease, unspecified: Secondary | ICD-10-CM | POA: Diagnosis not present

## 2020-04-27 DIAGNOSIS — Z743 Need for continuous supervision: Secondary | ICD-10-CM | POA: Diagnosis not present

## 2020-04-27 DIAGNOSIS — I252 Old myocardial infarction: Secondary | ICD-10-CM | POA: Insufficient documentation

## 2020-04-27 DIAGNOSIS — Z79899 Other long term (current) drug therapy: Secondary | ICD-10-CM | POA: Insufficient documentation

## 2020-04-27 MED ORDER — OXYMETAZOLINE HCL 0.05 % NA SOLN
1.0000 | Freq: Once | NASAL | Status: DC
  Filled 2020-04-27: qty 30

## 2020-04-27 NOTE — ED Notes (Signed)
Patient verbalizes understanding of discharge instructions. Opportunity for questioning and answers were provided. Armband removed by staff, pt discharged from ED.  

## 2020-04-27 NOTE — ED Triage Notes (Signed)
Pt arrives to ED w/ c/o nose bleed. Pt seen here recently for the same. Bleeding controlled by pressure w/ EMS.

## 2020-04-27 NOTE — ED Provider Notes (Signed)
[facility] EMERGENCY DEPARTMENT Provider Note   CSN: [id number] Arrival date & time: 04/27/20  1339     History Chief Complaint  Patient presents with  . Epistaxis    Lucas [name] is a 69 y.o. male.  69 year old male brought in by EMS for nosebleed around 11:30 AM, called EMS after 30 minutes of bleeding, bleeding controlled by EMS (packed with gauze). Reports onset on left side, then progressed to both sides. Patient was seen in the ER twice recently for same, thought to be related to his oxygen use and humidified oxygen was added. Patient did see ENT who advised him to return to the office if bleeding continued. Patient reports daily nose bleeds, unable to say if this was different today. No anticoagulant use. Patient has removed the gauze packing from EMS, no active bleeding at this time.         Past Medical History:  Diagnosis Date  . Acute blood loss anemia 06/21/2018  . Acute upper GI bleed 06/21/2018  . Allergy   . Bilateral AVN of femurs ([place]) 06/25/2018   See 11/2017 Chest CT report  . COPD 12/21/2006  . Environmental allergies   . History of femur fracture 06/25/2018   11/2017 CT AP showed Remote nonunited right femur greater trochanter fracture  . History of Fracture of right tibia 06/26/2018  . History of multiple right thoacic rib fractures 06/25/2018  . History of Right clavicular fracture 06/25/2018  . HYPERLIPIDEMIA 12/21/2006  . HYPERTENSION, BENIGN SYSTEMIC 12/21/2006  . Mallory-Weiss tear   . TOBACCO DEPENDENCE 12/21/2006    Patient Active Problem List   Diagnosis Date Noted  . Pulmonary nodule 02/10/2020  . Pleural effusion   . S/P CABG x 4 12/16/2019  . Coronary artery disease 12/16/2019  . Unstable angina ([place]) 12/12/2019  . NSTEMI (non-ST elevated myocardial infarction) ([place]) 12/12/2019  . CAD (coronary artery disease) 07/11/2018  . H. pylori infection 07/03/2018  . Chronic stable angina ([place]) 07/03/2018  . Duodenal ulcer due to  Helicobacter pylori   . History of Right clavicular fracture 06/25/2018  . Bilateral AVN of femurs ([place]) 06/25/2018  . Chronic cough 06/25/2018  . Mallory-Weiss tear   . Gastritis and gastroduodenitis   . Cirrhosis of liver ([place])   . Tubular adenoma of colon 01/24/2017  . Ganglion cyst 01/03/2012  . PERIPHERAL NEUROPATHY 07/15/2010  . Alcohol use disorder, severe, dependence ([place]) 05/14/2009  . DEFORMITY, FOOT NEC, CONGENITAL 08/22/2007  . ALLERGIC RHINITIS, SEASONAL 05/03/2007  . Hyperlipidemia 12/21/2006  . Tobacco abuse 12/21/2006  . COPD with chronic bronchitis ([place]) 12/21/2006    Past Surgical History:  Procedure Laterality Date  . ANKLE SURGERY     left ankle  . APPLICATION OF WOUND VAC  01/06/2012   Procedure: APPLICATION OF WOUND VAC;  Surgeon: Meredith Pel, MD;  Location: [facility];  Service: Orthopedics;  Laterality: Left;  . APPLICATION OF WOUND VAC Right 10/17/2015   Procedure: APPLICATION OF WOUND VAC;  Surgeon: Leandrew Koyanagi, MD;  Location: [facility];  Service: Orthopedics;  Laterality: Right;  . BIOPSY  06/22/2018   Procedure: BIOPSY;  Surgeon: [id number] v, DO;  Location: [facility];  Service: Gastroenterology;;  . CORONARY ARTERY BYPASS GRAFT N/A 12/16/2019   Procedure: CORONARY ARTERY BYPASS GRAFTING (CABG) TIMES 4 USING LEFT INTERNAL MAMMARY ARTERY AND ENDOSCOPICALLY HARVESTED RIGHT SAPHENOUS VEIN;  Surgeon: Lajuana Matte, MD;  Location: [facility];  Service: Open Heart Surgery;  Laterality: N/A;  . ESOPHAGOGASTRODUODENOSCOPY (  EGD) WITH PROPOFOL N/A 06/22/2018   Procedure: ESOPHAGOGASTRODUODENOSCOPY (EGD) WITH PROPOFOL;  Surgeon: [id number] v, DO;  Location: [facility];  Service: Gastroenterology;  Laterality: N/A;  . EXTERNAL FIXATION LEG  01/06/2012   Procedure: EXTERNAL FIXATION LEG;  Surgeon: Meredith Pel, MD;  Location: [facility];  Service: Orthopedics;  Laterality: Left;  Smith-nephew external fixator set  . EXTERNAL FIXATION LEG Right  09/16/2015   Procedure: EXTERNAL FIXATION LEG/LARGE;  Surgeon: Mcarthur Rossetti, MD;  Location: [facility];  Service: Orthopedics;  Laterality: Right;  . EXTERNAL FIXATION REMOVAL Right 09/22/2015   Procedure: REMOVAL EXTERNAL FIXATION LEG;  Surgeon: Mcarthur Rossetti, MD;  Location: [facility];  Service: Orthopedics;  Laterality: Right;  . FASCIOTOMY  01/10/2012   Procedure: FASCIOTOMY;  Surgeon: Rozanna Box, MD;  Location: [facility];  Service: Orthopedics;  Laterality: Left;  status post fasciotomy with wound vac left calf; adjustment external fixator left tibia under fluoro; retention suture/wound vac placement left lateral calf wound  . FASCIOTOMY  01/06/2012   Procedure: FASCIOTOMY;  Surgeon: Meredith Pel, MD;  Location: [facility];  Service: Orthopedics;  Laterality: Left;  Four compartment fasciotomy  . HOT HEMOSTASIS N/A 06/22/2018   Procedure: HEMOSTASIS;  Surgeon: [id number] v, DO;  Location: [facility];  Service: Gastroenterology;  Laterality: N/A;  Epinepherine and clip placed  . I & D EXTREMITY  01/12/2012   Procedure: IRRIGATION AND DEBRIDEMENT EXTREMITY;  Surgeon: Rozanna Box, MD;  Location: [facility];  Service: Orthopedics;  Laterality: Left;  LAYERD CLOSURE LEFT LEG WOUND  . I & D EXTREMITY Right 10/17/2015   Procedure: IRRIGATION AND DEBRIDEMENT EXTREMITY;  Surgeon: Leandrew Koyanagi, MD;  Location: [facility];  Service: Orthopedics;  Laterality: Right;  . I & D EXTREMITY Right 10/20/2015   Procedure: REPEAT IRRIGATION AND DEBRIDEMENT EXTREMITY, right lower leg;  Surgeon: Mcarthur Rossetti, MD;  Location: [facility];  Service: Orthopedics;  Laterality: Right;  . KNEE SURGERY     left knee and right knee  . LEFT HEART CATH AND CORONARY ANGIOGRAPHY N/A 06/27/2018   Procedure: LEFT HEART CATH AND CORONARY ANGIOGRAPHY;  Surgeon: Martinique, Peter M, MD;  Location: [facility];  Service: Cardiovascular;  Laterality: N/A;  . LEFT HEART CATH AND CORONARY ANGIOGRAPHY N/A 12/12/2019    Procedure: LEFT HEART CATH AND CORONARY ANGIOGRAPHY;  Surgeon: Wellington Hampshire, MD;  Location: [facility];  Service: Cardiovascular;  Laterality: N/A;  . MASS EXCISION Left 09/22/2015   Procedure: EXCISION MASS;  Surgeon: Mcarthur Rossetti, MD;  Location: [facility];  Service: Orthopedics;  Laterality: Left;  . ORIF TIBIA PLATEAU  02/07/2012   Procedure: OPEN REDUCTION INTERNAL FIXATION (ORIF) TIBIAL PLATEAU;  Surgeon: Rozanna Box, MD;  Location: [facility];  Service: Orthopedics;  Laterality: Left;  ORIF LEFT TIBIAL PLATEAU/ REMOVE EXTERNAL FIXATION LEFT LEG  . ORIF TIBIA PLATEAU Right 09/22/2015   Procedure: REMOVAL OF EXTERNAL FIXATOR RIGHT LEG, OPEN REDUCTION INTERNAL FIXATION (ORIF) RIGHT TIBIAL PLATEAU, EXCISION SMALL MASS LEFT LEG;  Surgeon: Mcarthur Rossetti, MD;  Location: [facility];  Service: Orthopedics;  Laterality: Right;  . SKIN SPLIT GRAFT Right 10/23/2015   Procedure: RIGHT GASTROC FLAP SPLIT THICKNESS SKIN GRAFT FROM RIGHT THING TO RIGHT LEG;  Surgeon: Irene Limbo, MD;  Location: [facility];  Service: Plastics;  Laterality: Right;  . TEE WITHOUT CARDIOVERSION N/A 12/16/2019   Procedure: TRANSESOPHAGEAL ECHOCARDIOGRAM (TEE);  Surgeon: Lajuana Matte, MD;  Location: [facility];  Service: Open Heart Surgery;  Laterality: N/A;  . WRIST SURGERY     left       Family History  Problem Relation Age of Onset  . Emphysema Father     Social History   Tobacco Use  . Smoking status: Current Every Day Smoker    Packs/day: 3.00    Years: 51.00    Pack years: 153.00    Types: Cigarettes  . Smokeless tobacco: Former Systems developer    Types: Snuff, Chew    Quit date: 01/05/1965  . Tobacco comment: currently smoking 1ppd as of 02/12/20  Substance Use Topics  . Alcohol use: Yes    Alcohol/week: 49.0 standard drinks    Types: 49 Cans of beer per week    Comment: drinks about a 6-pack/day  . Drug use: No    Home Medications Prior to Admission medications   Medication Sig Start  Date End Date Taking? Authorizing Provider  albuterol (PROAIR HFA) 108 (90 Base) MCG/ACT inhaler INHALE 2 PUFFS BY MOUTH EVERY 4 HOURS AS NEEDED FOR WHEEZE OR FOR SHORTNESS OF BREATH Patient taking differently: Inhale 2 puffs into the lungs every 4 (four) hours as needed for wheezing or shortness of breath.  02/08/20  Yes Brimage, Vondra, DO  ANORO ELLIPTA 62.5-25 MCG/INH AEPB INHALE 1 PUFF INTO THE LUNGS DAILY Patient taking differently: Inhale 1 puff into the lungs daily.  03/06/20  Yes Brimage, Vondra, DO  aspirin EC 81 MG tablet Take 81 mg by mouth daily.    [provider]  fluticasone (FLONASE) 50 MCG/ACT nasal spray SPRAY 2 SPRAYS INTO EACH NOSTRIL EVERY DAY Patient taking differently: Place 1 spray into both nostrils daily as needed for allergies.  07/19/18   Steve Rattler, DO  guaiFENesin (MUCINEX) 600 MG 12 hr tablet Take 1 tablet (600 mg total) by mouth 2 (two) times daily as needed. Patient not taking: Reported on 04/01/2020 12/23/19   Barrett, Lodema Hong, PA-C  losartan (COZAAR) 25 MG tablet Take 0.5 tablets (12.5 mg total) by mouth daily. 12/23/19   Barrett, Erin R, PA-C  metoprolol tartrate (LOPRESSOR) 25 MG tablet TAKE 1.5 TABLETS (37.5 MG TOTAL) BY MOUTH 2 (TWO) TIMES DAILY. 12/27/19   Lightfoot, Lucile Crater, MD  montelukast (SINGULAIR) 10 MG tablet TAKE 1 TABLET BY MOUTH EVERYDAY AT BEDTIME Patient not taking: Reported on 04/01/2020 11/27/19   [name], DO  nitroGLYCERIN (NITROSTAT) 0.4 MG SL tablet PLACE 1 TABLET (0.4 MG TOTAL) UNDER THE TONGUE EVERY 5 (FIVE) MINUTES AS NEEDED FOR CHEST PAIN. 03/02/20   [name], DO  oxymetazoline (AFRIN) 0.05 % nasal spray Place 1 spray into both nostrils 2 (two) times daily as needed for congestion.    [provider]  pantoprazole (PROTONIX) 40 MG tablet Take 1 tablet (40 mg total) by mouth 2 (two) times daily. Patient not taking: Reported on 02/04/2020 07/03/18 01/02/20  Martyn Malay, MD  rosuvastatin (CRESTOR) 20 MG tablet Take  20 mg by mouth at bedtime. 03/26/20   [provider]  rosuvastatin (CRESTOR) 40 MG tablet Take 1 tablet (40 mg total) by mouth daily. Patient not taking: Reported on 04/01/2020 02/08/20   [name], DO  loratadine (CLARITIN) 10 MG tablet Take 1 tablet (10 mg total) by mouth daily. 03/04/11 05/23/18  Mariana Arn, MD  metoprolol tartrate (LOPRESSOR) 25 MG tablet Take 1 tablet (25 mg total) by mouth 2 (two) times daily. 05/29/19   [name], DO    Allergies    Ace inhibitors, Ampicillin, Penicillins, Codeine, and Lisinopril  Review of Systems   Review of Systems  Constitutional: Negative for fever.  HENT: Positive for nosebleeds.   Skin: Negative for wound.  Neurological: Negative for weakness and headaches.  Hematological: Does not bruise/bleed easily.    Physical Exam Updated Vital Signs BP (!) 152/111 Comment: Pt states "Doctor  took me off blood pressure medication."  Pulse 99 Comment: Pt states "Doctor  took me off blood pressure medication."  Temp 98.2 F (36.8 C) (Oral)   Resp 20   Wt 63 kg   SpO2 99% Comment: Pt states "Doctor  took me off blood pressure medication."  BMI 19.39 kg/m   Physical Exam Vitals and nursing note reviewed.  Constitutional:      General: He is not in acute distress.    Appearance: He is well-developed. He is not diaphoretic.  HENT:     Head: Normocephalic and atraumatic.     Comments: Blood tinged mucous in nose, no active bleeding.     Mouth/Throat:     Mouth: Mucous membranes are moist.  Pulmonary:     Effort: Pulmonary effort is normal.  Skin:    General: Skin is warm and dry.  Neurological:     Mental Status: He is alert and oriented to person, place, and time.  Psychiatric:        Behavior: Behavior normal.     ED Results / Procedures / Treatments   Labs (all labs ordered are listed, but only abnormal results are displayed) Labs Reviewed - No data to display  EKG None  Radiology No results  found.  Procedures Procedures (including critical care time)  Medications Ordered in ED Medications  oxymetazoline (AFRIN) 0.05 % nasal spray 1 spray (0 sprays Each Nare Hold 04/27/20 1709)    ED Course  I have reviewed the triage vital signs and the nursing notes.  Pertinent labs & imaging results that were available during my care of the patient were reviewed by me and considered in my medical decision making (see chart for details).  Clinical Course as of Apr 27 1730  Mon Apr 27, 6517  7422 69 year old male brought in by EMS for nosebleed.  Patient arrived in the ER with no further bleeding, has been observed in the department for almost 4 hours, bleeding remains controlled.  On exam there is no active bleeding or identifiable source.  Discussed management with patient, he states that his blood nosebleed today could not be controlled with putting an ice pack on his head and when he continued to bleed for 30 minutes to an hour he called 911 and came to the hospital.  Patient does use Afrin at times for his nosebleeds.  Recommend patient follow-up with his ENT, he plans to call tomorrow to schedule an appointment, return to ER if bleeding resumes and is not controlled with his Afrin and pressure.   [LM]    Clinical Course User Index [LM] Roque Lias   MDM Rules/Calculators/A&P                          Final Clinical Impression(s) / ED Diagnoses Final diagnoses:  Epistaxis    Rx / DC Orders ED Discharge Orders    None       Tacy Learn, PA-C 04/27/20 1731    Lennice Sites, DO 04/27/20 2100

## 2020-04-27 NOTE — ED Notes (Signed)
Lucas [name] (Wife#[phone])called about status of her husband.

## 2020-04-27 NOTE — Discharge Instructions (Addendum)
If your nosebleed starts again, you should spray or Afrin in your nose and then pinch your nose for 15 minutes. If bleeding continues, call an ambulance or come back to the ER. Follow-up with your ENT, call tomorrow to schedule an appointment.

## 2020-04-28 ENCOUNTER — Emergency Department (HOSPITAL_COMMUNITY)
Admission: EM | Admit: 2020-04-28 | Discharge: 2020-04-28 | Disposition: A | Discharge status: Home / Self Care | Payer: Medicare Other | Attending: Emergency Medicine | Admitting: Emergency Medicine

## 2020-04-28 ENCOUNTER — Encounter (HOSPITAL_COMMUNITY): Payer: Self-pay | Admitting: Emergency Medicine

## 2020-04-28 DIAGNOSIS — J449 Chronic obstructive pulmonary disease, unspecified: Secondary | ICD-10-CM | POA: Insufficient documentation

## 2020-04-28 DIAGNOSIS — F17210 Nicotine dependence, cigarettes, uncomplicated: Secondary | ICD-10-CM | POA: Diagnosis not present

## 2020-04-28 DIAGNOSIS — I252 Old myocardial infarction: Secondary | ICD-10-CM | POA: Insufficient documentation

## 2020-04-28 DIAGNOSIS — R58 Hemorrhage, not elsewhere classified: Secondary | ICD-10-CM | POA: Diagnosis not present

## 2020-04-28 DIAGNOSIS — I10 Essential (primary) hypertension: Secondary | ICD-10-CM | POA: Diagnosis not present

## 2020-04-28 DIAGNOSIS — Z7982 Long term (current) use of aspirin: Secondary | ICD-10-CM | POA: Insufficient documentation

## 2020-04-28 DIAGNOSIS — Z79899 Other long term (current) drug therapy: Secondary | ICD-10-CM | POA: Diagnosis not present

## 2020-04-28 DIAGNOSIS — R040 Epistaxis: Secondary | ICD-10-CM | POA: Diagnosis not present

## 2020-04-28 LAB — CBC
HCT: 38.1 % — ABNORMAL LOW (ref 39.0–52.0)
Hemoglobin: 12.2 g/dL — ABNORMAL LOW (ref 13.0–17.0)
MCH: 29.4 pg (ref 26.0–34.0)
MCHC: 32 g/dL (ref 30.0–36.0)
MCV: 91.8 fL (ref 80.0–100.0)
Platelets: 192 10*3/uL (ref 150–400)
RBC: 4.15 MIL/uL — ABNORMAL LOW (ref 4.22–5.81)
RDW: 16.1 % — ABNORMAL HIGH (ref 11.5–15.5)
WBC: 3.4 10*3/uL — ABNORMAL LOW (ref 4.0–10.5)
nRBC: 0 % (ref 0.0–0.2)

## 2020-04-28 MED ORDER — OXYMETAZOLINE HCL 0.05 % NA SOLN
1.0000 | Freq: Once | NASAL | Status: DC
  Filled 2020-04-28: qty 30

## 2020-04-28 MED ORDER — CEPHALEXIN 500 MG PO CAPS
500.0000 mg | ORAL_CAPSULE | Freq: Two times a day (BID) | ORAL | 0 refills | Status: DC
Start: 2020-04-28 — End: 2020-05-06

## 2020-04-28 MED ORDER — LIDOCAINE-EPINEPHRINE-TETRACAINE (LET) TOPICAL GEL
3.0000 mL | Freq: Once | TOPICAL | Status: AC
  Administered 2020-04-28: 3 mL via TOPICAL
  Filled 2020-04-28: qty 3

## 2020-04-28 NOTE — Discharge Instructions (Addendum)
He was seen today for nosebleed.  Your lab work was reassuring.  You were packed with a nasal tampon.  Please leave it in place.  You have a follow-up on Monday with the ENT.  Please return to the emergency department if you have any new or worsening symptoms

## 2020-04-28 NOTE — ED Provider Notes (Signed)
[facility]ERGENCY DEPARTMENT Provider Note   CSN: [id number] Arrival date & time: 04/28/20  1057     History Chief Complaint  Patient presents with  . Epistaxis    Lucas [name] is a 69 y.o. male.  Patient is a 69 year old gentleman with past medical history of anemia, COPD, coronary artery disease, cirrhosis of the liver, multiple recurrent nosebleeds presenting to the emergency department for epistasis.  Patient reports that it began this morning out of the left nare.  Has been seen multiple times in the past for the same.  Was actually seen yesterday and Afrin was used and the bleeding had initially stopped.  He has seen ENT before and he reports that he tried to call them this morning but was unable to get through.  Denies any lightheadedness, dizziness.  He does take 81 mg of aspirin a day but no other anticoagulation        Past Medical History:  Diagnosis Date  . Acute blood loss anemia 06/21/2018  . Acute upper GI bleed 06/21/2018  . Allergy   . Bilateral AVN of femurs ([place]) 06/25/2018   See 11/2017 Chest CT report  . COPD 12/21/2006  . Environmental allergies   . History of femur fracture 06/25/2018   11/2017 CT AP showed Remote nonunited right femur greater trochanter fracture  . History of Fracture of right tibia 06/26/2018  . History of multiple right thoacic rib fractures 06/25/2018  . History of Right clavicular fracture 06/25/2018  . HYPERLIPIDEMIA 12/21/2006  . HYPERTENSION, BENIGN SYSTEMIC 12/21/2006  . Mallory-Weiss tear   . TOBACCO DEPENDENCE 12/21/2006    Patient Active Problem List   Diagnosis Date Noted  . Pulmonary nodule 02/10/2020  . Pleural effusion   . S/P CABG x 4 12/16/2019  . Coronary artery disease 12/16/2019  . Unstable angina ([place]) 12/12/2019  . NSTEMI (non-ST elevated myocardial infarction) ([place]) 12/12/2019  . CAD (coronary artery disease) 07/11/2018  . H. pylori infection 07/03/2018  . Chronic stable angina ([place]) 07/03/2018    . Duodenal ulcer due to Helicobacter pylori   . History of Right clavicular fracture 06/25/2018  . Bilateral AVN of femurs ([place]) 06/25/2018  . Chronic cough 06/25/2018  . Mallory-Weiss tear   . Gastritis and gastroduodenitis   . Cirrhosis of liver ([place])   . Tubular adenoma of colon 01/24/2017  . Ganglion cyst 01/03/2012  . PERIPHERAL NEUROPATHY 07/15/2010  . Alcohol use disorder, severe, dependence ([place]) 05/14/2009  . DEFORMITY, FOOT NEC, CONGENITAL 08/22/2007  . ALLERGIC RHINITIS, SEASONAL 05/03/2007  . Hyperlipidemia 12/21/2006  . Tobacco abuse 12/21/2006  . COPD with chronic bronchitis ([place]) 12/21/2006    Past Surgical History:  Procedure Laterality Date  . ANKLE SURGERY     left ankle  . APPLICATION OF WOUND VAC  01/06/2012   Procedure: APPLICATION OF WOUND VAC;  Surgeon: Meredith Pel, MD;  Location: [facility];  Service: Orthopedics;  Laterality: Left;  . APPLICATION OF WOUND VAC Right 10/17/2015   Procedure: APPLICATION OF WOUND VAC;  Surgeon: Leandrew Koyanagi, MD;  Location: [facility];  Service: Orthopedics;  Laterality: Right;  . BIOPSY  06/22/2018   Procedure: BIOPSY;  Surgeon: [id number] v, DO;  Location: [facility];  Service: Gastroenterology;;  . CORONARY ARTERY BYPASS GRAFT N/A 12/16/2019   Procedure: CORONARY ARTERY BYPASS GRAFTING (CABG) TIMES 4 USING LEFT INTERNAL MAMMARY ARTERY AND ENDOSCOPICALLY HARVESTED RIGHT SAPHENOUS VEIN;  Surgeon: Lajuana Matte, MD;  Location: [facility];  Service: Open Heart Surgery;  Laterality: N/A;  . ESOPHAGOGASTRODUODENOSCOPY (EGD) WITH PROPOFOL N/A 06/22/2018   Procedure: ESOPHAGOGASTRODUODENOSCOPY (EGD) WITH PROPOFOL;  Surgeon: [id number] v, DO;  Location: [facility];  Service: Gastroenterology;  Laterality: N/A;  . EXTERNAL FIXATION LEG  01/06/2012   Procedure: EXTERNAL FIXATION LEG;  Surgeon: Meredith Pel, MD;  Location: [facility];  Service: Orthopedics;  Laterality: Left;  Smith-nephew external fixator set  . EXTERNAL  FIXATION LEG Right 09/16/2015   Procedure: EXTERNAL FIXATION LEG/LARGE;  Surgeon: Mcarthur Rossetti, MD;  Location: [facility];  Service: Orthopedics;  Laterality: Right;  . EXTERNAL FIXATION REMOVAL Right 09/22/2015   Procedure: REMOVAL EXTERNAL FIXATION LEG;  Surgeon: Mcarthur Rossetti, MD;  Location: [facility];  Service: Orthopedics;  Laterality: Right;  . FASCIOTOMY  01/10/2012   Procedure: FASCIOTOMY;  Surgeon: Rozanna Box, MD;  Location: [facility];  Service: Orthopedics;  Laterality: Left;  status post fasciotomy with wound vac left calf; adjustment external fixator left tibia under fluoro; retention suture/wound vac placement left lateral calf wound  . FASCIOTOMY  01/06/2012   Procedure: FASCIOTOMY;  Surgeon: Meredith Pel, MD;  Location: [facility];  Service: Orthopedics;  Laterality: Left;  Four compartment fasciotomy  . HOT HEMOSTASIS N/A 06/22/2018   Procedure: HEMOSTASIS;  Surgeon: [id number] v, DO;  Location: [facility];  Service: Gastroenterology;  Laterality: N/A;  Epinepherine and clip placed  . I & D EXTREMITY  01/12/2012   Procedure: IRRIGATION AND DEBRIDEMENT EXTREMITY;  Surgeon: Rozanna Box, MD;  Location: [facility];  Service: Orthopedics;  Laterality: Left;  LAYERD CLOSURE LEFT LEG WOUND  . I & D EXTREMITY Right 10/17/2015   Procedure: IRRIGATION AND DEBRIDEMENT EXTREMITY;  Surgeon: Leandrew Koyanagi, MD;  Location: [facility];  Service: Orthopedics;  Laterality: Right;  . I & D EXTREMITY Right 10/20/2015   Procedure: REPEAT IRRIGATION AND DEBRIDEMENT EXTREMITY, right lower leg;  Surgeon: Mcarthur Rossetti, MD;  Location: [facility];  Service: Orthopedics;  Laterality: Right;  . KNEE SURGERY     left knee and right knee  . LEFT HEART CATH AND CORONARY ANGIOGRAPHY N/A 06/27/2018   Procedure: LEFT HEART CATH AND CORONARY ANGIOGRAPHY;  Surgeon: Martinique, Peter M, MD;  Location: [facility];  Service: Cardiovascular;  Laterality: N/A;  . LEFT HEART CATH AND CORONARY ANGIOGRAPHY  N/A 12/12/2019   Procedure: LEFT HEART CATH AND CORONARY ANGIOGRAPHY;  Surgeon: Wellington Hampshire, MD;  Location: [facility];  Service: Cardiovascular;  Laterality: N/A;  . MASS EXCISION Left 09/22/2015   Procedure: EXCISION MASS;  Surgeon: Mcarthur Rossetti, MD;  Location: [facility];  Service: Orthopedics;  Laterality: Left;  . ORIF TIBIA PLATEAU  02/07/2012   Procedure: OPEN REDUCTION INTERNAL FIXATION (ORIF) TIBIAL PLATEAU;  Surgeon: Rozanna Box, MD;  Location: [facility];  Service: Orthopedics;  Laterality: Left;  ORIF LEFT TIBIAL PLATEAU/ REMOVE EXTERNAL FIXATION LEFT LEG  . ORIF TIBIA PLATEAU Right 09/22/2015   Procedure: REMOVAL OF EXTERNAL FIXATOR RIGHT LEG, OPEN REDUCTION INTERNAL FIXATION (ORIF) RIGHT TIBIAL PLATEAU, EXCISION SMALL MASS LEFT LEG;  Surgeon: Mcarthur Rossetti, MD;  Location: [facility];  Service: Orthopedics;  Laterality: Right;  . SKIN SPLIT GRAFT Right 10/23/2015   Procedure: RIGHT GASTROC FLAP SPLIT THICKNESS SKIN GRAFT FROM RIGHT THING TO RIGHT LEG;  Surgeon: Irene Limbo, MD;  Location: [facility];  Service: Plastics;  Laterality: Right;  . TEE WITHOUT CARDIOVERSION N/A 12/16/2019   Procedure: TRANSESOPHAGEAL ECHOCARDIOGRAM (TEE);  Surgeon: Lajuana Matte, MD;  Location: [facility];  Service: Open Heart Surgery;  Laterality: N/A;  . WRIST SURGERY     left       Family History  Problem Relation Age of Onset  . Emphysema Father     Social History   Tobacco Use  . Smoking status: Current Every Day Smoker    Packs/day: 3.00    Years: 51.00    Pack years: 153.00    Types: Cigarettes  . Smokeless tobacco: Former Systems developer    Types: Snuff, Chew    Quit date: 01/05/1965  . Tobacco comment: currently smoking 1ppd as of 02/12/20  Substance Use Topics  . Alcohol use: Yes    Alcohol/week: 49.0 standard drinks    Types: 49 Cans of beer per week    Comment: drinks about a 6-pack/day  . Drug use: No    Home Medications Prior to Admission medications     Medication Sig Start Date End Date Taking? Authorizing Provider  albuterol (PROAIR HFA) 108 (90 Base) MCG/ACT inhaler INHALE 2 PUFFS BY MOUTH EVERY 4 HOURS AS NEEDED FOR WHEEZE OR FOR SHORTNESS OF BREATH Patient taking differently: Inhale 2 puffs into the lungs every 4 (four) hours as needed for wheezing or shortness of breath.  02/08/20   Brimage, Vondra, DO  ANORO ELLIPTA 62.5-25 MCG/INH AEPB INHALE 1 PUFF INTO THE LUNGS DAILY Patient taking differently: Inhale 1 puff into the lungs daily.  03/06/20   [name], DO  aspirin EC 81 MG tablet Take 81 mg by mouth daily.    [provider]  cephALEXin (KEFLEX) 500 MG capsule Take 1 capsule (500 mg total) by mouth 2 (two) times daily for 7 days. 04/28/20 05/05/20  Madilyn Hook A, PA-C  fluticasone (FLONASE) 50 MCG/ACT nasal spray SPRAY 2 SPRAYS INTO EACH NOSTRIL EVERY DAY Patient taking differently: Place 1 spray into both nostrils daily as needed for allergies.  07/19/18   Steve Rattler, DO  guaiFENesin (MUCINEX) 600 MG 12 hr tablet Take 1 tablet (600 mg total) by mouth 2 (two) times daily as needed. Patient not taking: Reported on 04/01/2020 12/23/19   Barrett, Lodema Hong, PA-C  losartan (COZAAR) 25 MG tablet Take 0.5 tablets (12.5 mg total) by mouth daily. 12/23/19   Barrett, Erin R, PA-C  metoprolol tartrate (LOPRESSOR) 25 MG tablet TAKE 1.5 TABLETS (37.5 MG TOTAL) BY MOUTH 2 (TWO) TIMES DAILY. 12/27/19   Lightfoot, Lucile Crater, MD  montelukast (SINGULAIR) 10 MG tablet TAKE 1 TABLET BY MOUTH EVERYDAY AT BEDTIME Patient not taking: Reported on 04/01/2020 11/27/19   [name], DO  nitroGLYCERIN (NITROSTAT) 0.4 MG SL tablet PLACE 1 TABLET (0.4 MG TOTAL) UNDER THE TONGUE EVERY 5 (FIVE) MINUTES AS NEEDED FOR CHEST PAIN. 03/02/20   [name], DO  oxymetazoline (AFRIN) 0.05 % nasal spray Place 1 spray into both nostrils 2 (two) times daily as needed for congestion.    [provider]  pantoprazole (PROTONIX) 40 MG tablet Take 1 tablet (40  mg total) by mouth 2 (two) times daily. Patient not taking: Reported on 02/04/2020 07/03/18 01/02/20  Martyn Malay, MD  rosuvastatin (CRESTOR) 20 MG tablet Take 20 mg by mouth at bedtime. 03/26/20   [provider]  rosuvastatin (CRESTOR) 40 MG tablet Take 1 tablet (40 mg total) by mouth daily. Patient not taking: Reported on 04/01/2020 02/08/20   [name], DO  loratadine (CLARITIN) 10 MG tablet Take 1 tablet (10 mg total) by mouth daily. 03/04/11 05/23/18  Mariana Arn, MD  metoprolol tartrate (LOPRESSOR) 25  MG tablet Take 1 tablet (25 mg total) by mouth 2 (two) times daily. 05/29/19   [name], DO    Allergies    Ace inhibitors, Ampicillin, Penicillins, Codeine, and Lisinopril  Review of Systems   Review of Systems  Constitutional: Negative for fatigue.  HENT: Positive for nosebleeds.   Respiratory: Negative for cough and shortness of breath.   Neurological: Negative for dizziness and light-headedness.  All other systems reviewed and are negative.   Physical Exam Updated Vital Signs BP (!) 155/86   Pulse (!) 112   Temp 98.5 F (36.9 C) (Oral)   Resp 19   Ht 5\' 11"  (1.803 m)   Wt 63 kg   SpO2 96%   BMI 19.39 kg/m   Physical Exam Vitals and nursing note reviewed.  Constitutional:      General: He is not in acute distress.    Appearance: Normal appearance. He is not ill-appearing, toxic-appearing or diaphoretic.  HENT:     Head: Normocephalic.     Nose:     Left Nostril: Epistaxis present.     Comments: Anterior epistasis out of the left nare at [facility] plexus    Mouth/Throat:     Mouth: Mucous membranes are moist.  Eyes:     Conjunctiva/sclera: Conjunctivae normal.  Cardiovascular:     Rate and Rhythm: Normal rate and regular rhythm.  Pulmonary:     Effort: Pulmonary effort is normal.  Skin:    General: Skin is dry.  Neurological:     Mental Status: He is alert.  Psychiatric:        Mood and Affect: Mood normal.     ED Results /  Procedures / Treatments   Labs (all labs ordered are listed, but only abnormal results are displayed) Labs Reviewed  CBC - Abnormal; Notable for the following components:      Result Value   WBC 3.4 (*)    RBC 4.15 (*)    Hemoglobin 12.2 (*)    HCT 38.1 (*)    RDW 16.1 (*)    All other components within normal limits    EKG None  Radiology No results found.  Procedures .Epistaxis Management  Date/Time: 04/28/2020 12:59 PM Performed by: Alveria Apley, PA-C Authorized by: Alveria Apley, PA-C   Consent:    Consent obtained:  Verbal   Consent given by:  Patient   Risks discussed:  Bleeding, infection and nasal injury   Alternatives discussed:  No treatment Anesthesia (see MAR for exact dosages):    Anesthesia method:  Topical application   Topical anesthetic:  LET Procedure details:    Treatment site:  L anterior   Treatment method:  Silver nitrate, nasal tampon and anterior pack   Treatment complexity:  Extensive   Treatment episode: recurring   Post-procedure details:    Assessment:  Bleeding stopped   Patient tolerance of procedure:  Tolerated well, no immediate complications   (including critical care time)  Medications Ordered in ED Medications  oxymetazoline (AFRIN) 0.05 % nasal spray 1 spray (has no administration in time range)  lidocaine-EPINEPHrine-tetracaine (LET) topical gel (3 mLs Topical Given 04/28/20 1135)    ED Course  I have reviewed the triage vital signs and the nursing notes.  Pertinent labs & imaging results that were available during my care of the patient were reviewed by me and considered in my medical decision making (see chart for details).  Clinical Course as of Apr 28 1301  Tue Apr 28, 2020  1223 Patient here with recurrent epistaxis. Did not respond to silver nitrate. Obvious anterior bleed on exam. Patient nose packed. I called his ENT office and we have scheduled an appointment for Monday 4pm. Patient aware. Discussed return  precaution. Start keflex   [KM]    Clinical Course User Index [KM] Kristine Royal   MDM Rules/Calculators/A&P                          Based on review of vitals, medical screening exam, lab work and/or imaging, there does not appear to be an acute, emergent etiology for the patient's symptoms. Counseled pt on good return precautions and encouraged both PCP and ED follow-up as needed.  Prior to discharge, I also discussed incidental imaging findings with patient in detail and advised appropriate, recommended follow-up in detail.  Clinical Impression: 1. Epistaxis     Disposition: Discharge  Prior to providing a prescription for a controlled substance, I independently reviewed the patient's recent prescription history on the [place]. The patient had no recent or regular prescriptions and was deemed appropriate for a brief, less than 3 day prescription of narcotic for acute analgesia.  This note was prepared with assistance of Systems analyst. Occasional wrong-word or sound-a-like substitutions may have occurred due to the inherent limitations of voice recognition software.  Final Clinical Impression(s) / ED Diagnoses Final diagnoses:  Epistaxis    Rx / DC Orders ED Discharge Orders         Ordered    cephALEXin (KEFLEX) 500 MG capsule  2 times daily     Discontinue  Reprint     04/28/20 1300           Kristine Royal 04/28/20 1302    Quintella Reichert, MD 04/28/20 1623

## 2020-04-28 NOTE — ED Triage Notes (Signed)
Pt arrives via [facility] for ongoing nosebleed, states he was seen here yesterday for nose bleed which he was given afrin, did not pack nose. Bleeding began again today around 1020, used afrin with no relief.

## 2020-05-02 ENCOUNTER — Other Ambulatory Visit: Payer: Self-pay

## 2020-05-02 ENCOUNTER — Emergency Department (HOSPITAL_COMMUNITY): Payer: Medicare Other

## 2020-05-02 ENCOUNTER — Inpatient Hospital Stay (HOSPITAL_COMMUNITY): Payer: Medicare Other

## 2020-05-02 ENCOUNTER — Inpatient Hospital Stay (HOSPITAL_COMMUNITY)
Admission: EM | Admit: 2020-05-02 | Discharge: 2020-05-06 | DRG: 166 | Disposition: A | Discharge status: Home Health | Payer: Medicare Other | Attending: Family Medicine | Admitting: Family Medicine

## 2020-05-02 ENCOUNTER — Encounter (HOSPITAL_COMMUNITY): Payer: Self-pay

## 2020-05-02 DIAGNOSIS — E785 Hyperlipidemia, unspecified: Secondary | ICD-10-CM | POA: Diagnosis not present

## 2020-05-02 DIAGNOSIS — Z91048 Other nonmedicinal substance allergy status: Secondary | ICD-10-CM

## 2020-05-02 DIAGNOSIS — Z880 Allergy status to penicillin: Secondary | ICD-10-CM

## 2020-05-02 DIAGNOSIS — Z825 Family history of asthma and other chronic lower respiratory diseases: Secondary | ICD-10-CM

## 2020-05-02 DIAGNOSIS — Z951 Presence of aortocoronary bypass graft: Secondary | ICD-10-CM

## 2020-05-02 DIAGNOSIS — F419 Anxiety disorder, unspecified: Secondary | ICD-10-CM | POA: Diagnosis not present

## 2020-05-02 DIAGNOSIS — J9601 Acute respiratory failure with hypoxia: Secondary | ICD-10-CM

## 2020-05-02 DIAGNOSIS — R0602 Shortness of breath: Secondary | ICD-10-CM | POA: Diagnosis not present

## 2020-05-02 DIAGNOSIS — R040 Epistaxis: Secondary | ICD-10-CM | POA: Diagnosis not present

## 2020-05-02 DIAGNOSIS — Z9981 Dependence on supplemental oxygen: Secondary | ICD-10-CM | POA: Diagnosis not present

## 2020-05-02 DIAGNOSIS — R918 Other nonspecific abnormal finding of lung field: Secondary | ICD-10-CM | POA: Diagnosis not present

## 2020-05-02 DIAGNOSIS — R847 Abnormal histological findings in specimens from respiratory organs and thorax: Secondary | ICD-10-CM | POA: Diagnosis not present

## 2020-05-02 DIAGNOSIS — F1010 Alcohol abuse, uncomplicated: Secondary | ICD-10-CM | POA: Diagnosis not present

## 2020-05-02 DIAGNOSIS — R4702 Dysphasia: Secondary | ICD-10-CM | POA: Diagnosis not present

## 2020-05-02 DIAGNOSIS — K7030 Alcoholic cirrhosis of liver without ascites: Secondary | ICD-10-CM | POA: Diagnosis not present

## 2020-05-02 DIAGNOSIS — Z79899 Other long term (current) drug therapy: Secondary | ICD-10-CM | POA: Diagnosis not present

## 2020-05-02 DIAGNOSIS — Z7982 Long term (current) use of aspirin: Secondary | ICD-10-CM

## 2020-05-02 DIAGNOSIS — I10 Essential (primary) hypertension: Secondary | ICD-10-CM | POA: Diagnosis present

## 2020-05-02 DIAGNOSIS — J9811 Atelectasis: Secondary | ICD-10-CM | POA: Diagnosis not present

## 2020-05-02 DIAGNOSIS — Z9889 Other specified postprocedural states: Secondary | ICD-10-CM

## 2020-05-02 DIAGNOSIS — F17210 Nicotine dependence, cigarettes, uncomplicated: Secondary | ICD-10-CM | POA: Diagnosis present

## 2020-05-02 DIAGNOSIS — I2510 Atherosclerotic heart disease of native coronary artery without angina pectoris: Secondary | ICD-10-CM | POA: Diagnosis present

## 2020-05-02 DIAGNOSIS — J9621 Acute and chronic respiratory failure with hypoxia: Secondary | ICD-10-CM | POA: Diagnosis not present

## 2020-05-02 DIAGNOSIS — Z720 Tobacco use: Secondary | ICD-10-CM

## 2020-05-02 DIAGNOSIS — Z20822 Contact with and (suspected) exposure to covid-19: Secondary | ICD-10-CM | POA: Diagnosis present

## 2020-05-02 DIAGNOSIS — C3411 Malignant neoplasm of upper lobe, right bronchus or lung: Secondary | ICD-10-CM | POA: Diagnosis present

## 2020-05-02 DIAGNOSIS — J90 Pleural effusion, not elsewhere classified: Secondary | ICD-10-CM | POA: Diagnosis present

## 2020-05-02 DIAGNOSIS — Z888 Allergy status to other drugs, medicaments and biological substances status: Secondary | ICD-10-CM

## 2020-05-02 DIAGNOSIS — J309 Allergic rhinitis, unspecified: Secondary | ICD-10-CM | POA: Diagnosis present

## 2020-05-02 DIAGNOSIS — J441 Chronic obstructive pulmonary disease with (acute) exacerbation: Secondary | ICD-10-CM | POA: Diagnosis not present

## 2020-05-02 DIAGNOSIS — R911 Solitary pulmonary nodule: Secondary | ICD-10-CM | POA: Diagnosis not present

## 2020-05-02 DIAGNOSIS — R6889 Other general symptoms and signs: Secondary | ICD-10-CM | POA: Diagnosis not present

## 2020-05-02 DIAGNOSIS — I252 Old myocardial infarction: Secondary | ICD-10-CM | POA: Diagnosis not present

## 2020-05-02 DIAGNOSIS — R0689 Other abnormalities of breathing: Secondary | ICD-10-CM | POA: Diagnosis not present

## 2020-05-02 DIAGNOSIS — M21969 Unspecified acquired deformity of unspecified lower leg: Secondary | ICD-10-CM | POA: Diagnosis present

## 2020-05-02 DIAGNOSIS — Z743 Need for continuous supervision: Secondary | ICD-10-CM | POA: Diagnosis not present

## 2020-05-02 DIAGNOSIS — I700 Atherosclerosis of aorta: Secondary | ICD-10-CM | POA: Diagnosis not present

## 2020-05-02 DIAGNOSIS — S2241XD Multiple fractures of ribs, right side, subsequent encounter for fracture with routine healing: Secondary | ICD-10-CM | POA: Diagnosis not present

## 2020-05-02 DIAGNOSIS — J439 Emphysema, unspecified: Secondary | ICD-10-CM | POA: Diagnosis not present

## 2020-05-02 LAB — I-STAT ARTERIAL BLOOD GAS, ED
Acid-Base Excess: 5 mmol/L — ABNORMAL HIGH (ref 0.0–2.0)
Bicarbonate: 31 mmol/L — ABNORMAL HIGH (ref 20.0–28.0)
Calcium, Ion: 1.26 mmol/L (ref 1.15–1.40)
HCT: 37 % — ABNORMAL LOW (ref 39.0–52.0)
Hemoglobin: 12.6 g/dL — ABNORMAL LOW (ref 13.0–17.0)
O2 Saturation: 98 %
Patient temperature: 98.6
Potassium: 4 mmol/L (ref 3.5–5.1)
Sodium: 139 mmol/L (ref 135–145)
TCO2: 33 mmol/L — ABNORMAL HIGH (ref 22–32)
pCO2 arterial: 51.3 mmHg — ABNORMAL HIGH (ref 32.0–48.0)
pH, Arterial: 7.389 (ref 7.350–7.450)
pO2, Arterial: 111 mmHg — ABNORMAL HIGH (ref 83.0–108.0)

## 2020-05-02 LAB — CBC WITH DIFFERENTIAL/PLATELET
Abs Immature Granulocytes: 0.01 10*3/uL (ref 0.00–0.07)
Basophils Absolute: 0 10*3/uL (ref 0.0–0.1)
Basophils Relative: 0 %
Eosinophils Absolute: 0.1 10*3/uL (ref 0.0–0.5)
Eosinophils Relative: 1 %
HCT: 36.8 % — ABNORMAL LOW (ref 39.0–52.0)
Hemoglobin: 11.7 g/dL — ABNORMAL LOW (ref 13.0–17.0)
Immature Granulocytes: 0 %
Lymphocytes Relative: 36 %
Lymphs Abs: 2.4 10*3/uL (ref 0.7–4.0)
MCH: 29.9 pg (ref 26.0–34.0)
MCHC: 31.8 g/dL (ref 30.0–36.0)
MCV: 94.1 fL (ref 80.0–100.0)
Monocytes Absolute: 0.6 10*3/uL (ref 0.1–1.0)
Monocytes Relative: 9 %
Neutro Abs: 3.6 10*3/uL (ref 1.7–7.7)
Neutrophils Relative %: 54 %
Platelets: 154 10*3/uL (ref 150–400)
RBC: 3.91 MIL/uL — ABNORMAL LOW (ref 4.22–5.81)
RDW: 15.6 % — ABNORMAL HIGH (ref 11.5–15.5)
WBC: 6.8 10*3/uL (ref 4.0–10.5)
nRBC: 0 % (ref 0.0–0.2)

## 2020-05-02 LAB — BASIC METABOLIC PANEL
Anion gap: 13 (ref 5–15)
BUN: 6 mg/dL — ABNORMAL LOW (ref 8–23)
CO2: 28 mmol/L (ref 22–32)
Calcium: 9.7 mg/dL (ref 8.9–10.3)
Chloride: 99 mmol/L (ref 98–111)
Creatinine, Ser: 0.71 mg/dL (ref 0.61–1.24)
GFR calc Af Amer: >60 mL/min (ref >60)
GFR calc non Af Amer: >60 mL/min (ref >60)
Glucose, Bld: 127 mg/dL — ABNORMAL HIGH (ref 70–99)
Potassium: 4 mmol/L (ref 3.5–5.1)
Sodium: 140 mmol/L (ref 135–145)

## 2020-05-02 LAB — HEMOGLOBIN A1C
Hgb A1c MFr Bld: 5.5 % (ref 4.8–5.6)
Mean Plasma Glucose: 111.15 mg/dL

## 2020-05-02 LAB — LACTIC ACID, PLASMA
Lactic Acid, Venous: 2.1 mmol/L (ref 0.5–1.9)
Lactic Acid, Venous: 3.1 mmol/L (ref 0.5–1.9)

## 2020-05-02 LAB — PROTIME-INR
INR: 1 (ref 0.8–1.2)
Prothrombin Time: 12.8 seconds (ref 11.4–15.2)

## 2020-05-02 LAB — SARS CORONAVIRUS 2 BY RT PCR (HOSPITAL ORDER, PERFORMED IN ~~LOC~~ HOSPITAL LAB): SARS Coronavirus 2: NEGATIVE

## 2020-05-02 LAB — BRAIN NATRIURETIC PEPTIDE: B Natriuretic Peptide: 54.8 pg/mL (ref 0.0–100.0)

## 2020-05-02 LAB — GLUCOSE, CAPILLARY
Glucose-Capillary: 223 mg/dL — ABNORMAL HIGH (ref 70–99)
Glucose-Capillary: 165 mg/dL — ABNORMAL HIGH (ref 70–99)

## 2020-05-02 LAB — CBG MONITORING, ED: Glucose-Capillary: 171 mg/dL — ABNORMAL HIGH (ref 70–99)

## 2020-05-02 MED ORDER — SODIUM CHLORIDE 0.9 % IV SOLN
500.0000 mg | INTRAVENOUS | Status: AC
  Administered 2020-05-02: 500 mg via INTRAVENOUS
  Filled 2020-05-02: qty 500

## 2020-05-02 MED ORDER — LORAZEPAM 2 MG/ML IJ SOLN
0.5000 mg | Freq: Once | INTRAMUSCULAR | Status: AC
  Administered 2020-05-02: 0.5 mg via INTRAVENOUS

## 2020-05-02 MED ORDER — IPRATROPIUM BROMIDE 0.02 % IN SOLN
0.5000 mg | Freq: Once | RESPIRATORY_TRACT | Status: AC
  Administered 2020-05-02: 0.5 mg via RESPIRATORY_TRACT

## 2020-05-02 MED ORDER — POLYETHYLENE GLYCOL 3350 17 G PO PACK: 17.0000 g | PACK | Freq: Every day | ORAL | Status: DC | PRN

## 2020-05-02 MED ORDER — IPRATROPIUM-ALBUTEROL 0.5-2.5 (3) MG/3ML IN SOLN
3.0000 mL | RESPIRATORY_TRACT | Status: DC
  Administered 2020-05-02 (×2): 3 mL via RESPIRATORY_TRACT
  Filled 2020-05-02 (×2): qty 3

## 2020-05-02 MED ORDER — ACETAMINOPHEN 325 MG PO TABS: 650.0000 mg | ORAL_TABLET | Freq: Four times a day (QID) | ORAL | Status: DC | PRN

## 2020-05-02 MED ORDER — INSULIN ASPART 100 UNIT/ML ~~LOC~~ SOLN
0.0000 [IU] | SUBCUTANEOUS | Status: DC
  Administered 2020-05-02 (×2): 1 [IU] via SUBCUTANEOUS
  Administered 2020-05-02: 2 [IU] via SUBCUTANEOUS

## 2020-05-02 MED ORDER — IPRATROPIUM-ALBUTEROL 0.5-2.5 (3) MG/3ML IN SOLN
3.0000 mL | Freq: Four times a day (QID) | RESPIRATORY_TRACT | Status: DC
  Administered 2020-05-03 (×2): 3 mL via RESPIRATORY_TRACT
  Filled 2020-05-02 (×2): qty 3

## 2020-05-02 MED ORDER — THIAMINE HCL 100 MG/ML IJ SOLN
100.0000 mg | Freq: Every day | INTRAMUSCULAR | Status: DC
  Filled 2020-05-02: qty 2

## 2020-05-02 MED ORDER — AZITHROMYCIN 250 MG PO TABS
250.0000 mg | ORAL_TABLET | Freq: Every day | ORAL | Status: AC
  Administered 2020-05-03 – 2020-05-06 (×4): 250 mg via ORAL
  Filled 2020-05-02 (×4): qty 1

## 2020-05-02 MED ORDER — NICOTINE 21 MG/24HR TD PT24
21.0000 mg | MEDICATED_PATCH | Freq: Every day | TRANSDERMAL | Status: DC
  Filled 2020-05-02 (×4): qty 1

## 2020-05-02 MED ORDER — ROSUVASTATIN CALCIUM 20 MG PO TABS
20.0000 mg | ORAL_TABLET | Freq: Every day | ORAL | Status: DC
  Administered 2020-05-02 – 2020-05-05 (×4): 20 mg via ORAL
  Filled 2020-05-02: qty 1
  Filled 2020-05-02: qty 4
  Filled 2020-05-02 (×2): qty 1

## 2020-05-02 MED ORDER — ALBUTEROL SULFATE (2.5 MG/3ML) 0.083% IN NEBU
5.0000 mg | INHALATION_SOLUTION | Freq: Once | RESPIRATORY_TRACT | Status: AC
  Administered 2020-05-02: 5 mg via RESPIRATORY_TRACT

## 2020-05-02 MED ORDER — LOSARTAN POTASSIUM 25 MG PO TABS
12.5000 mg | ORAL_TABLET | Freq: Every day | ORAL | Status: DC
  Administered 2020-05-02 – 2020-05-06 (×5): 12.5 mg via ORAL
  Filled 2020-05-02 (×2): qty 1
  Filled 2020-05-02: qty 0.5
  Filled 2020-05-02 (×3): qty 1

## 2020-05-02 MED ORDER — IPRATROPIUM-ALBUTEROL 0.5-2.5 (3) MG/3ML IN SOLN
3.0000 mL | RESPIRATORY_TRACT | Status: AC
  Filled 2020-05-02: qty 3

## 2020-05-02 MED ORDER — ALBUTEROL (5 MG/ML) CONTINUOUS INHALATION SOLN
10.0000 mg | INHALATION_SOLUTION | RESPIRATORY_TRACT | Status: DC
  Administered 2020-05-02: 10 mg via RESPIRATORY_TRACT

## 2020-05-02 MED ORDER — ACETAMINOPHEN 650 MG RE SUPP: 650.0000 mg | Freq: Four times a day (QID) | RECTAL | Status: DC | PRN

## 2020-05-02 MED ORDER — ASPIRIN EC 81 MG PO TBEC
81.0000 mg | DELAYED_RELEASE_TABLET | Freq: Every day | ORAL | Status: DC
  Administered 2020-05-02 – 2020-05-06 (×5): 81 mg via ORAL
  Filled 2020-05-02 (×5): qty 1

## 2020-05-02 MED ORDER — PREDNISONE 20 MG PO TABS
40.0000 mg | ORAL_TABLET | Freq: Every day | ORAL | Status: DC
  Administered 2020-05-03 – 2020-05-06 (×4): 40 mg via ORAL
  Filled 2020-05-02 (×5): qty 2

## 2020-05-02 MED ORDER — ENSURE ENLIVE PO LIQD
237.0000 mL | Freq: Two times a day (BID) | ORAL | Status: DC
  Administered 2020-05-03: 237 mL via ORAL

## 2020-05-02 MED ORDER — METOPROLOL TARTRATE 25 MG PO TABS
37.5000 mg | ORAL_TABLET | Freq: Two times a day (BID) | ORAL | Status: DC
  Administered 2020-05-02 – 2020-05-06 (×8): 37.5 mg via ORAL
  Filled 2020-05-02 (×5): qty 1
  Filled 2020-05-02: qty 2
  Filled 2020-05-02 (×2): qty 1

## 2020-05-02 MED ORDER — FOLIC ACID 1 MG PO TABS
1.0000 mg | ORAL_TABLET | Freq: Every day | ORAL | Status: DC
  Administered 2020-05-02 – 2020-05-06 (×5): 1 mg via ORAL
  Filled 2020-05-02 (×5): qty 1

## 2020-05-02 MED ORDER — LORAZEPAM 2 MG/ML IJ SOLN
1.0000 mg | Freq: Once | INTRAMUSCULAR | Status: DC
  Filled 2020-05-02: qty 1

## 2020-05-02 MED ORDER — ENOXAPARIN SODIUM 40 MG/0.4ML ~~LOC~~ SOLN
40.0000 mg | Freq: Every day | SUBCUTANEOUS | Status: DC
  Administered 2020-05-02: 40 mg via SUBCUTANEOUS
  Filled 2020-05-02 (×3): qty 0.4

## 2020-05-02 MED ORDER — CEPHALEXIN 500 MG PO CAPS
500.0000 mg | ORAL_CAPSULE | Freq: Two times a day (BID) | ORAL | Status: DC
  Administered 2020-05-02: 500 mg via ORAL
  Filled 2020-05-02: qty 2

## 2020-05-02 MED ORDER — ADULT MULTIVITAMIN W/MINERALS CH
1.0000 | ORAL_TABLET | Freq: Every day | ORAL | Status: DC
  Administered 2020-05-03 – 2020-05-06 (×4): 1 via ORAL
  Filled 2020-05-02 (×4): qty 1

## 2020-05-02 MED ORDER — THIAMINE HCL 100 MG PO TABS
100.0000 mg | ORAL_TABLET | Freq: Every day | ORAL | Status: DC
  Administered 2020-05-03 – 2020-05-06 (×4): 100 mg via ORAL
  Filled 2020-05-02 (×4): qty 1

## 2020-05-02 NOTE — Progress Notes (Signed)
FPTS Interim Progress Note  S: Patient states he would like to have pulmonology see him for procedure while he is in the hospital.  He states he has trouble getting to his appointments in the morning.  Reports he was more short of breath on arrival but this is improving.  He is concerned as to why he continues to have nose bleeds.    O: BP 138/79 (BP Location: Right Arm)   Pulse 92   Temp 98.7 F (37.1 C) (Oral)   Resp 16   Ht 5\' 11"  (1.803 m)   Wt 58.7 kg   SpO2 98%   BMI 18.06 kg/m    GEN: sitting upright in bed, in no acute distress  CV: regular rate and rhythm, no murmurs  RESP: no increased work of breathing, clear to ascultation bilaterally with no crackles, wheezes, or rhonchi, decreased air movement in the basilar lung fields, wearing [place] 3 L SKIN: warm, dry   A/P:  COPD exacerbation  - Will curbside vs consult pulmonology in AM  - Stable on home [place]  - see H&P for full A&P   [name], DO 05/02/2020, 10:52 PM PGY-2, [facility] Service pager [phone]

## 2020-05-02 NOTE — H&P (Addendum)
[facility] Admission History and Physical Service Pager: [phone]  Patient name: Lucas [name] Medical record number: [id number] Date of birth: 12/25/1950 Age: 69 y.o. Gender: male  Primary Care Provider: [name], DO Consultants: Pulmonology   Code Status: FULL Preferred Emergency Contact: Wife, Lucas [name] [phone]  Chief Complaint: Shortness of breath  Assessment and Plan: Lucas [name] is a 69 y.o. male presenting with shortness of breath. PMH is significant for COPD on 3L home oxygen, CAD, NSTEMI, CABG x4, most recently 11/2019, tobacco abuse, alcohol abuse, recurrent epistaxis.   Acute on Chronic Respiratory Failure 2/2 COPD exacerbation: Acute, improving. Reports several day history of worsening SOB, cough, and sputum production. GOLD stage D COPD on 3L chronically, required BiPAP on arrival.  Reassuringly, afebrile and appears to be breathing comfortably on evaluation while on BiPAP after continuous albuterol and IV Solu-Medrol.  Suspect clinical presentation is likely secondary to COPD exacerbation.  Doubt PNA as he is afebrile with no infiltrates on CXR.  Fully vaccinated for Covid and PCR negative on admit.  Well score for PE 0. -Admit to progressive, attending Lucas [name] -Will FYI his pulmonologist team, Lucas [name], specifically for below as he was working towards bronchoscopy in the outpatient setting -Continuous pulse ox - Wean off BiPAP, O2 sat goal 88-92% - IV Azithromycin 500 at 250 mL/hr x1  - PO Azithromycin 250mg  x4days after first IV dose - 40 mg prednisone beginning tomorrow  - Duoneb q4h  -Follow-up blood cultures -Hold home Anoro Ellipta given scheduled duonebs  Pulmonary lung nodule on CT 11/2019  Abnormal CXR  Patient is a 51 pack year smoker. Follows with pulmonologist, Lucas [name]. Was found to have pulmonary nodules in past and was scheduled to have a bronchoscopy with biopsy, however, patient did not make appointment as  he was in the ED for epistaxis.  Notable 11 mm spiculated lung nodule on CT in 11/2019, concerning for bronchogenic carcinoma. - Consult pulmonologist to see if they would like to do anything while patient is admitted  -Obtain CT chest without contrast for f/u and characterization of ?Loculated chronic pleural effusion on CXR  Alcoholic liver Cirrhosis: Chronic, stable. Meld NA score 7, <2% 90-day mortality.  Patient with known alcohol abuse. INR 1 in May  - Check PT, INR -CMP in the a.m. -CIWA as below  CAD s/p recent CABG 11/2019: Stable. Patient with CABG earlier this year. Was previously on ASA 325 mg but was reduced to 81 mg due to recurrent nose bleeds.  Follows up with Dr. Martinique, cardiology. - Continue ASA 81 mg - Continue rosuvastatin 20mg    Alcohol Abuse Drinks a 6-pack daily. Last drink was 2100 last night. He denies any prior history of withdrawal or seizures. 0.5 Ativan injection administered in ED. - CIWA  - monitor vitals -Thiamine, folate, multivitamin  Recurrent anterior epistaxis: Stable. Recently seen in the ED for this several times.  Only on ASA 81 mg and hemoglobin remaining stable.  Has follow-up with ENT next week, recommended starting Keflex course on 7/6. -Monitor for epistaxis -Follow-up with ENT -Continue ASA and DVT prophylaxis, can reassess if needed -Will hold Keflex course while on azithromycin  Tobacco Abuse 1 PPD - Nicotine patch  -Encouraged tobacco cessation  FEN/GI: NPO while on BiPAP, otherwise heart healthy/carb modified diet Prophylaxis: Lovenox  Disposition: Admit to progressive, attending Lucas [name]  History of Present Illness:  KODEE RAVERT is a 69 y.o. male with a history of COPD chronically on  3L O2, CAD s/p CABG in 2021, presenting with shortness of breath.   He reports worsening of his breathing this morning with associated chest tightness, increased his home 3L to 5L with minimal relief and thus called EMS.  States he has had  worsening shortness of breath, cough, and sputum production for a few days prior to this.  His chest tightness is now resolved after receiving continuous albuterol via EMS/ED.  He has been using his albuterol inhaler more for the past week.  He is fully vaccinated for Covid.  No known sick contacts, denies any fever.  Current smoker, smokes 1 PPD.  Follows with pulmonology.  Also drinks 6 pack of beer daily, last drink was yesterday evening around 2100. Denies any previous history of alcohol withdrawal requiring ativan during previous hospitalizations.   ED Course: Noted to be 90% on 5L via EMS, initially in respiratory distress upon arrival to ED.  Received albuterol and 125mg  solumedrol by EMS prior to arrival.  Ipratropium 0.5 x2 in ED. On BiPAP with significant improvement in respiratory status. Given 0.5mg  Ativan.   Review Of Systems: Per HPI with the following additions:   Review of Systems  HENT: Positive for nosebleeds.   Respiratory: Positive for cough, chest tightness, shortness of breath and wheezing.   Gastrointestinal: Negative for abdominal pain and constipation.  Neurological: Negative for light-headedness and headaches.  Psychiatric/Behavioral: The patient is nervous/anxious.      Patient Active Problem List   Diagnosis Date Noted  . Acute on chronic respiratory failure with hypoxia ([place]) 05/02/2020  . Pulmonary nodule 02/10/2020  . Pleural effusion   . S/P CABG x 4 12/16/2019  . Coronary artery disease 12/16/2019  . Unstable angina ([place]) 12/12/2019  . NSTEMI (non-ST elevated myocardial infarction) ([place]) 12/12/2019  . CAD (coronary artery disease) 07/11/2018  . H. pylori infection 07/03/2018  . Chronic stable angina ([place]) 07/03/2018  . Duodenal ulcer due to Helicobacter pylori   . History of Right clavicular fracture 06/25/2018  . Bilateral AVN of femurs ([place]) 06/25/2018  . Chronic cough 06/25/2018  . Mallory-Weiss tear   . Gastritis and gastroduodenitis   .  Cirrhosis of liver ([place])   . Tubular adenoma of colon 01/24/2017  . Ganglion cyst 01/03/2012  . PERIPHERAL NEUROPATHY 07/15/2010  . Alcohol use disorder, severe, dependence ([place]) 05/14/2009  . DEFORMITY, FOOT NEC, CONGENITAL 08/22/2007  . ALLERGIC RHINITIS, SEASONAL 05/03/2007  . Hyperlipidemia 12/21/2006  . Tobacco abuse 12/21/2006  . COPD with chronic bronchitis ([place]) 12/21/2006    Past Medical History: Past Medical History:  Diagnosis Date  . Acute blood loss anemia 06/21/2018  . Acute upper GI bleed 06/21/2018  . Allergy   . Bilateral AVN of femurs ([place]) 06/25/2018   See 11/2017 Chest CT report  . COPD 12/21/2006  . Environmental allergies   . History of femur fracture 06/25/2018   11/2017 CT AP showed Remote nonunited right femur greater trochanter fracture  . History of Fracture of right tibia 06/26/2018  . History of multiple right thoacic rib fractures 06/25/2018  . History of Right clavicular fracture 06/25/2018  . HYPERLIPIDEMIA 12/21/2006  . HYPERTENSION, BENIGN SYSTEMIC 12/21/2006  . Mallory-Weiss tear   . TOBACCO DEPENDENCE 12/21/2006    Past Surgical History: Past Surgical History:  Procedure Laterality Date  . ANKLE SURGERY     left ankle  . APPLICATION OF WOUND VAC  01/06/2012   Procedure: APPLICATION OF WOUND VAC;  Surgeon: Meredith Pel, MD;  Location: [facility];  Service: Orthopedics;  Laterality: Left;  . APPLICATION OF WOUND VAC Right 10/17/2015   Procedure: APPLICATION OF WOUND VAC;  Surgeon: Leandrew Koyanagi, MD;  Location: [place];  Service: Orthopedics;  Laterality: Right;  . BIOPSY  06/22/2018   Procedure: BIOPSY;  Surgeon: [id number] v, DO;  Location: [facility];  Service: Gastroenterology;;  . CORONARY ARTERY BYPASS GRAFT N/A 12/16/2019   Procedure: CORONARY ARTERY BYPASS GRAFTING (CABG) TIMES 4 USING LEFT INTERNAL MAMMARY ARTERY AND ENDOSCOPICALLY HARVESTED RIGHT SAPHENOUS VEIN;  Surgeon: Lajuana Matte, MD;  Location: [place];  Service: Open Heart  Surgery;  Laterality: N/A;  . ESOPHAGOGASTRODUODENOSCOPY (EGD) WITH PROPOFOL N/A 06/22/2018   Procedure: ESOPHAGOGASTRODUODENOSCOPY (EGD) WITH PROPOFOL;  Surgeon: [id number] v, DO;  Location: [facility];  Service: Gastroenterology;  Laterality: N/A;  . EXTERNAL FIXATION LEG  01/06/2012   Procedure: EXTERNAL FIXATION LEG;  Surgeon: Meredith Pel, MD;  Location: [facility];  Service: Orthopedics;  Laterality: Left;  Smith-nephew external fixator set  . EXTERNAL FIXATION LEG Right 09/16/2015   Procedure: EXTERNAL FIXATION LEG/LARGE;  Surgeon: Mcarthur Rossetti, MD;  Location: [place];  Service: Orthopedics;  Laterality: Right;  . EXTERNAL FIXATION REMOVAL Right 09/22/2015   Procedure: REMOVAL EXTERNAL FIXATION LEG;  Surgeon: Mcarthur Rossetti, MD;  Location: [place];  Service: Orthopedics;  Laterality: Right;  . FASCIOTOMY  01/10/2012   Procedure: FASCIOTOMY;  Surgeon: Rozanna Box, MD;  Location: [facility];  Service: Orthopedics;  Laterality: Left;  status post fasciotomy with wound vac left calf; adjustment external fixator left tibia under fluoro; retention suture/wound vac placement left lateral calf wound  . FASCIOTOMY  01/06/2012   Procedure: FASCIOTOMY;  Surgeon: Meredith Pel, MD;  [facility];  Service: Orthopedics;  Laterality: Left;  Four compartment fasciotomy  . HOT HEMOSTASIS N/A 06/22/2018   Procedure: HEMOSTASIS;  Surgeon: [id number] v, DO;  [facility]: [facility];  Service: Gastroenterology;  Laterality: N/A;  Epinepherine and clip placed  . I & D EXTREMITY  01/12/2012   Procedure: IRRIGATION AND DEBRIDEMENT EXTREMITY;  Surgeon: Rozanna Box, MD;  Location: [facility];  Service: Orthopedics;  Laterality: Left;  LAYERD CLOSURE LEFT LEG WOUND  . I & D EXTREMITY Right 10/17/2015   Procedure: IRRIGATION AND DEBRIDEMENT EXTREMITY;  Surgeon: Leandrew Koyanagi, MD;  Location: [facility];  Service: Orthopedics;  Laterality: Right;  . I & D EXTREMITY Right 10/20/2015   Procedure:  REPEAT IRRIGATION AND DEBRIDEMENT EXTREMITY, right lower leg;  Surgeon: Mcarthur Rossetti, MD;  Location: [place];  Service: Orthopedics;  Laterality: Right;  . KNEE SURGERY     left knee and right knee  . LEFT HEART CATH AND CORONARY ANGIOGRAPHY N/A 06/27/2018   Procedure: LEFT HEART CATH AND CORONARY ANGIOGRAPHY;  Surgeon: Lucas [name], Peter M, MD;  Location: [facility];  Service: Cardiovascular;  Laterality: N/A;  . LEFT HEART CATH AND CORONARY ANGIOGRAPHY N/A 12/12/2019   Procedure: LEFT HEART CATH AND CORONARY ANGIOGRAPHY;  Surgeon: Wellington Hampshire, MD;  Location: [place] [facility];  Service: Cardiovascular;  Laterality: N/A;  . MASS EXCISION Left 09/22/2015   Procedure: EXCISION MASS;  Surgeon: Mcarthur Rossetti, MD;  Location: [facility];  Service: Orthopedics;  Laterality: Left;  . ORIF TIBIA PLATEAU  02/07/2012   Procedure: OPEN REDUCTION INTERNAL FIXATION (ORIF) TIBIAL PLATEAU;  Surgeon: Rozanna Box, MD;  Location: [facility];  Service: Orthopedics;  Laterality: Left;  ORIF LEFT TIBIAL PLATEAU/ REMOVE EXTERNAL FIXATION LEFT LEG  . ORIF TIBIA PLATEAU Right  09/22/2015   Procedure: REMOVAL OF EXTERNAL FIXATOR RIGHT LEG, OPEN REDUCTION INTERNAL FIXATION (ORIF) RIGHT TIBIAL PLATEAU, EXCISION SMALL MASS LEFT LEG;  Surgeon: Mcarthur Rossetti, MD;  Location: [facility];  Service: Orthopedics;  Laterality: Right;  . SKIN SPLIT GRAFT Right 10/23/2015   Procedure: RIGHT GASTROC FLAP SPLIT THICKNESS SKIN GRAFT FROM RIGHT THING TO RIGHT LEG;  Surgeon: Irene Limbo, MD;  Location: [place];  Service: Plastics;  Laterality: Right;  . TEE WITHOUT CARDIOVERSION N/A 12/16/2019   Procedure: TRANSESOPHAGEAL ECHOCARDIOGRAM (TEE);  Surgeon: Lajuana Matte, MD;  Location: [place];  Service: Open Heart Surgery;  Laterality: N/A;  . WRIST SURGERY     left    Social History: Social History   Tobacco Use  . Smoking status: Current Every Day Smoker    Packs/day: 3.00    Years: 51.00    Pack years:  153.00    Types: Cigarettes  . Smokeless tobacco: Former Systems developer    Types: Snuff, Chew    Quit date: 01/05/1965  . Tobacco comment: currently smoking 1ppd as of 02/12/20  Substance Use Topics  . Alcohol use: Yes    Alcohol/week: 49.0 standard drinks    Types: 49 Cans of beer per week    Comment: drinks about a 6-pack/day  . Drug use: No   Please also refer to relevant sections of EMR.  Family History: Family History  Problem Relation Age of Onset  . Emphysema Father     Allergies and Medications: Allergies  Allergen Reactions  . Ace Inhibitors Swelling  . Ampicillin Nausea And Vomiting  . Penicillins Nausea And Vomiting    Did it involve swelling of the face/tongue/throat, SOB, or low BP? N Did it involve sudden or severe rash/hives, skin peeling, or any reaction on the inside of your mouth or nose? N Did you need to seek medical attention at a hospital or doctor's office? N When did it last happen?Teenager If all above answers are "NO", may proceed with cephalosporin use.  . Codeine Nausea And Vomiting  . Lisinopril Swelling    REACTION: angioedema   No current facility-administered medications on file prior to encounter.   Current Outpatient Medications on File Prior to Encounter  Medication Sig Dispense Refill  . albuterol (PROAIR HFA) 108 (90 Base) MCG/ACT inhaler INHALE 2 PUFFS BY MOUTH EVERY 4 HOURS AS NEEDED FOR WHEEZE OR FOR SHORTNESS OF BREATH (Patient taking differently: Inhale 2 puffs into the lungs every 4 (four) hours as needed for wheezing or shortness of breath. ) 6.7 g 6  . ANORO ELLIPTA 62.5-25 MCG/INH AEPB INHALE 1 PUFF INTO THE LUNGS DAILY (Patient taking differently: Inhale 1 puff into the lungs daily. ) 60 each 3  . aspirin EC 81 MG tablet Take 81 mg by mouth daily.    . cephALEXin (KEFLEX) 500 MG capsule Take 1 capsule (500 mg total) by mouth 2 (two) times daily for 7 days. 14 capsule 0  . fluticasone (FLONASE) 50 MCG/ACT nasal spray SPRAY 2 SPRAYS  INTO EACH NOSTRIL EVERY DAY (Patient taking differently: Place 1 spray into both nostrils daily as needed for allergies. ) 16 g 2  . guaiFENesin (MUCINEX) 600 MG 12 hr tablet Take 1 tablet (600 mg total) by mouth 2 (two) times daily as needed. (Patient not taking: Reported on 04/01/2020)    . losartan (COZAAR) 25 MG tablet Take 0.5 tablets (12.5 mg total) by mouth daily. 30 tablet 3  . metoprolol tartrate (LOPRESSOR) 25 MG tablet TAKE 1.5  TABLETS (37.5 MG TOTAL) BY MOUTH 2 (TWO) TIMES DAILY. 270 tablet 1  . montelukast (SINGULAIR) 10 MG tablet TAKE 1 TABLET BY MOUTH EVERYDAY AT BEDTIME (Patient not taking: Reported on 04/01/2020) 90 tablet 1  . nitroGLYCERIN (NITROSTAT) 0.4 MG SL tablet PLACE 1 TABLET (0.4 MG TOTAL) UNDER THE TONGUE EVERY 5 (FIVE) MINUTES AS NEEDED FOR CHEST PAIN. 150 tablet 1  . oxymetazoline (AFRIN) 0.05 % nasal spray Place 1 spray into both nostrils 2 (two) times daily as needed for congestion.    . pantoprazole (PROTONIX) 40 MG tablet Take 1 tablet (40 mg total) by mouth 2 (two) times daily. (Patient not taking: Reported on 02/04/2020) 108 tablet 0  . rosuvastatin (CRESTOR) 20 MG tablet Take 20 mg by mouth at bedtime.    . rosuvastatin (CRESTOR) 40 MG tablet Take 1 tablet (40 mg total) by mouth daily. (Patient not taking: Reported on 04/01/2020) 90 tablet 3  . [DISCONTINUED] loratadine (CLARITIN) 10 MG tablet Take 1 tablet (10 mg total) by mouth daily. 30 tablet 6  . [DISCONTINUED] metoprolol tartrate (LOPRESSOR) 25 MG tablet Take 1 tablet (25 mg total) by mouth 2 (two) times daily. 180 tablet 0    Objective: BP 127/74   Pulse (!) 103   Resp 18   SpO2 98%  Exam: General: On BiPAP, mildly anxious appearing, in no acute distress Eyes: nystagmus b/l, no scleral icterus  ENTM: patent airway, dried blood in nares  Neck: supple Cardiovascular: RRR, no murmurs appreciated Respiratory: Decreased air movement throughout all lung fields without no wheezing appreciated, currently  breathing comfortably without any accessory muscle use on BiPAP, satting at 98% Gastrointestinal: Nontender to palpation, soft, no rebound or guarding MSK: Moves extremities spontaneously, no chest wall tenderness, mild clubbing of the fingernails seen bilaterally Psych: Mood appropriate  Labs and Imaging: CBC BMET  Recent Labs  Lab 05/02/20 0743 05/02/20 0743 05/02/20 1047  WBC 6.8  --   --   HGB 11.7*   < > 12.6*  HCT 36.8*   < > 37.0*  PLT 154  --   --    < > = values in this interval not displayed.   Recent Labs  Lab 05/02/20 0743 05/02/20 0743 05/02/20 1047  NA 140   < > 139  K 4.0   < > 4.0  CL 99  --   --   CO2 28  --   --   BUN 6*  --   --   CREATININE 0.71  --   --   GLUCOSE 127*  --   --   CALCIUM 9.7  --   --    < > = values in this interval not displayed.     EKG: Sinus tachycardia at 100 bpm, LVH, bi-atrial enlargement   Chest x-ray 7/10 IMPRESSION: - Stable right pleural effusion with right base atelectasis. - Stable nodular appearing opacity lateral left base. Question chronic loculated pleural effusion or rounded atelectasis. Note that this finding was not present on February 2021 study. It may be prudent to correlate with noncontrast enhanced chest CT to further assess this area. - Heart upper normal in size with postoperative changes. - Old rib trauma on the right.  Sharion Settler, DO 05/02/2020, 1:18 PM PGY-1, [place] Intern pager: [phone], text pages welcome  FPTS Upper-Level Resident Addendum   I have independently interviewed and examined the patient. I have discussed the above with the original author and agree with their documentation. My  edits for correction/addition/clarification are in green. Please see also any attending notes.    Patriciaann Clan, DO  Family Medicine PGY-3

## 2020-05-02 NOTE — ED Provider Notes (Signed)
[facility] EMERGENCY DEPARTMENT Provider Note   CSN: [id number] Arrival date & time: 05/02/20  8841     History Chief Complaint  Patient presents with  . Shortness of Breath    Lucas [name] is a 69 y.o. male.  Pt presents to the ED today with sob.  Pt has a hx of COPD and normally wears 3L oxygen via [place].  He said he increased his O2 to 5 and will still sob, so called EMS.  O2 sat 90% on 5L.  Pt given albuterol and 125 mg solumedrol by EMS en route.  Breathing has improved some.  No f/c.  He's been vaccinated against Covid.        Past Medical History:  Diagnosis Date  . Acute blood loss anemia 06/21/2018  . Acute upper GI bleed 06/21/2018  . Allergy   . Bilateral AVN of femurs ([place]) 06/25/2018   See 11/2017 Chest CT report  . COPD 12/21/2006  . Environmental allergies   . History of femur fracture 06/25/2018   11/2017 CT AP showed Remote nonunited right femur greater trochanter fracture  . History of Fracture of right tibia 06/26/2018  . History of multiple right thoacic rib fractures 06/25/2018  . History of Right clavicular fracture 06/25/2018  . HYPERLIPIDEMIA 12/21/2006  . HYPERTENSION, BENIGN SYSTEMIC 12/21/2006  . Mallory-Weiss tear   . TOBACCO DEPENDENCE 12/21/2006    Patient Active Problem List   Diagnosis Date Noted  . Pulmonary nodule 02/10/2020  . Pleural effusion   . S/P CABG x 4 12/16/2019  . Coronary artery disease 12/16/2019  . Unstable angina ([place]) 12/12/2019  . NSTEMI (non-ST elevated myocardial infarction) ([place]) 12/12/2019  . CAD (coronary artery disease) 07/11/2018  . H. pylori infection 07/03/2018  . Chronic stable angina ([place]) 07/03/2018  . Duodenal ulcer due to Helicobacter pylori   . History of Right clavicular fracture 06/25/2018  . Bilateral AVN of femurs ([place]) 06/25/2018  . Chronic cough 06/25/2018  . Mallory-Weiss tear   . Gastritis and gastroduodenitis   . Cirrhosis of liver ([place])   . Tubular adenoma of colon 01/24/2017  .  Ganglion cyst 01/03/2012  . PERIPHERAL NEUROPATHY 07/15/2010  . Alcohol use disorder, severe, dependence ([place]) 05/14/2009  . DEFORMITY, FOOT NEC, CONGENITAL 08/22/2007  . ALLERGIC RHINITIS, SEASONAL 05/03/2007  . Hyperlipidemia 12/21/2006  . Tobacco abuse 12/21/2006  . COPD with chronic bronchitis ([place]) 12/21/2006    Past Surgical History:  Procedure Laterality Date  . ANKLE SURGERY     left ankle  . APPLICATION OF WOUND VAC  01/06/2012   Procedure: APPLICATION OF WOUND VAC;  Surgeon: Meredith Pel, MD;  Location: [facility];  Service: Orthopedics;  Laterality: Left;  . APPLICATION OF WOUND VAC Right 10/17/2015   Procedure: APPLICATION OF WOUND VAC;  Surgeon: Leandrew Koyanagi, MD;  Location: [facility];  Service: Orthopedics;  Laterality: Right;  . BIOPSY  06/22/2018   Procedure: BIOPSY;  Surgeon: [id number] v, DO;  Location: [facility];  Service: Gastroenterology;;  . CORONARY ARTERY BYPASS GRAFT N/A 12/16/2019   Procedure: CORONARY ARTERY BYPASS GRAFTING (CABG) TIMES 4 USING LEFT INTERNAL MAMMARY ARTERY AND ENDOSCOPICALLY HARVESTED RIGHT SAPHENOUS VEIN;  Surgeon: Lajuana Matte, MD;  Location: [facility];  Service: Open Heart Surgery;  Laterality: N/A;  . ESOPHAGOGASTRODUODENOSCOPY (EGD) WITH PROPOFOL N/A 06/22/2018   Procedure: ESOPHAGOGASTRODUODENOSCOPY (EGD) WITH PROPOFOL;  Surgeon: [id number] v, DO;  Location: [facility];  Service: Gastroenterology;  Laterality: N/A;  . EXTERNAL FIXATION LEG  01/06/2012  Procedure: EXTERNAL FIXATION LEG;  Surgeon: Meredith Pel, MD;  Location: [facility];  Service: Orthopedics;  Laterality: Left;  Smith-nephew external fixator set  . EXTERNAL FIXATION LEG Right 09/16/2015   Procedure: EXTERNAL FIXATION LEG/LARGE;  Surgeon: Mcarthur Rossetti, MD;  Location: [place] [facility];  Service: Orthopedics;  Laterality: Right;  . EXTERNAL FIXATION REMOVAL Right 09/22/2015   Procedure: REMOVAL EXTERNAL FIXATION LEG;  Surgeon: Mcarthur Rossetti, MD;   Location: [facility];  Service: Orthopedics;  Laterality: Right;  . FASCIOTOMY  01/10/2012   Procedure: FASCIOTOMY;  Surgeon: Rozanna Box, MD;  Location: [facility];  Service: Orthopedics;  Laterality: Left;  status post fasciotomy with wound vac left calf; adjustment external fixator left tibia under fluoro; retention suture/wound vac placement left lateral calf wound  . FASCIOTOMY  01/06/2012   Procedure: FASCIOTOMY;  Surgeon: Meredith Pel, MD;  Location: [facility];  Service: Orthopedics;  Laterality: Left;  Four compartment fasciotomy  . HOT HEMOSTASIS N/A 06/22/2018   Procedure: HEMOSTASIS;  Surgeon: [id number] v, DO;  Location: [facility];  Service: Gastroenterology;  Laterality: N/A;  Epinepherine and clip placed  . I & D EXTREMITY  01/12/2012   Procedure: IRRIGATION AND DEBRIDEMENT EXTREMITY;  Surgeon: Rozanna Box, MD;  Location: [facility];  Service: Orthopedics;  Laterality: Left;  LAYERD CLOSURE LEFT LEG WOUND  . I & D EXTREMITY Right 10/17/2015   Procedure: IRRIGATION AND DEBRIDEMENT EXTREMITY;  Surgeon: Leandrew Koyanagi, MD;  Location: [facility];  Service: Orthopedics;  Laterality: Right;  . I & D EXTREMITY Right 10/20/2015   Procedure: REPEAT IRRIGATION AND DEBRIDEMENT EXTREMITY, right lower leg;  Surgeon: Mcarthur Rossetti, MD;  Location: [facility];  Service: Orthopedics;  Laterality: Right;  . KNEE SURGERY     left knee and right knee  . LEFT HEART CATH AND CORONARY ANGIOGRAPHY N/A 06/27/2018   Procedure: LEFT HEART CATH AND CORONARY ANGIOGRAPHY;  Surgeon: Martinique, Peter M, MD;  Location: [facility];  Service: Cardiovascular;  Laterality: N/A;  . LEFT HEART CATH AND CORONARY ANGIOGRAPHY N/A 12/12/2019   Procedure: LEFT HEART CATH AND CORONARY ANGIOGRAPHY;  Surgeon: Wellington Hampshire, MD;  Location: [facility];  Service: Cardiovascular;  Laterality: N/A;  . MASS EXCISION Left 09/22/2015   Procedure: EXCISION MASS;  Surgeon: Mcarthur Rossetti, MD;  Location: [facility];   Service: Orthopedics;  Laterality: Left;  . ORIF TIBIA PLATEAU  02/07/2012   Procedure: OPEN REDUCTION INTERNAL FIXATION (ORIF) TIBIAL PLATEAU;  Surgeon: Rozanna Box, MD;  Location: [facility];  Service: Orthopedics;  Laterality: Left;  ORIF LEFT TIBIAL PLATEAU/ REMOVE EXTERNAL FIXATION LEFT LEG  . ORIF TIBIA PLATEAU Right 09/22/2015   Procedure: REMOVAL OF EXTERNAL FIXATOR RIGHT LEG, OPEN REDUCTION INTERNAL FIXATION (ORIF) RIGHT TIBIAL PLATEAU, EXCISION SMALL MASS LEFT LEG;  Surgeon: Mcarthur Rossetti, MD;  Location: [facility];  Service: Orthopedics;  Laterality: Right;  . SKIN SPLIT GRAFT Right 10/23/2015   Procedure: RIGHT GASTROC FLAP SPLIT THICKNESS SKIN GRAFT FROM RIGHT THING TO RIGHT LEG;  Surgeon: Irene Limbo, MD;  Location: [facility];  Service: Plastics;  Laterality: Right;  . TEE WITHOUT CARDIOVERSION N/A 12/16/2019   Procedure: TRANSESOPHAGEAL ECHOCARDIOGRAM (TEE);  Surgeon: Lajuana Matte, MD;  Location: [facility];  Service: Open Heart Surgery;  Laterality: N/A;  . WRIST SURGERY     left       Family History  Problem Relation Age of Onset  . Emphysema Father     Social History  Tobacco Use  . Smoking status: Current Every Day Smoker    Packs/day: 3.00    Years: 51.00    Pack years: 153.00    Types: Cigarettes  . Smokeless tobacco: Former Systems developer    Types: Snuff, Chew    Quit date: 01/05/1965  . Tobacco comment: currently smoking 1ppd as of 02/12/20  Substance Use Topics  . Alcohol use: Yes    Alcohol/week: 49.0 standard drinks    Types: 49 Cans of beer per week    Comment: drinks about a 6-pack/day  . Drug use: No    Home Medications Prior to Admission medications   Medication Sig Start Date End Date Taking? Authorizing Provider  albuterol (PROAIR HFA) 108 (90 Base) MCG/ACT inhaler INHALE 2 PUFFS BY MOUTH EVERY 4 HOURS AS NEEDED FOR WHEEZE OR FOR SHORTNESS OF BREATH Patient taking differently: Inhale 2 puffs into the lungs every 4 (four) hours as needed for  wheezing or shortness of breath.  02/08/20   Brimage, Vondra, DO  ANORO ELLIPTA 62.5-25 MCG/INH AEPB INHALE 1 PUFF INTO THE LUNGS DAILY Patient taking differently: Inhale 1 puff into the lungs daily.  03/06/20   [name], DO  aspirin EC 81 MG tablet Take 81 mg by mouth daily.    [provider]  cephALEXin (KEFLEX) 500 MG capsule Take 1 capsule (500 mg total) by mouth 2 (two) times daily for 7 days. 04/28/20 05/05/20  Madilyn Hook A, PA-C  fluticasone (FLONASE) 50 MCG/ACT nasal spray SPRAY 2 SPRAYS INTO EACH NOSTRIL EVERY DAY Patient taking differently: Place 1 spray into both nostrils daily as needed for allergies.  07/19/18   Steve Rattler, DO  guaiFENesin (MUCINEX) 600 MG 12 hr tablet Take 1 tablet (600 mg total) by mouth 2 (two) times daily as needed. Patient not taking: Reported on 04/01/2020 12/23/19   Barrett, Lodema Hong, PA-C  losartan (COZAAR) 25 MG tablet Take 0.5 tablets (12.5 mg total) by mouth daily. 12/23/19   Barrett, Erin R, PA-C  metoprolol tartrate (LOPRESSOR) 25 MG tablet TAKE 1.5 TABLETS (37.5 MG TOTAL) BY MOUTH 2 (TWO) TIMES DAILY. 12/27/19   Lightfoot, Lucile Crater, MD  montelukast (SINGULAIR) 10 MG tablet TAKE 1 TABLET BY MOUTH EVERYDAY AT BEDTIME Patient not taking: Reported on 04/01/2020 11/27/19   [name], DO  nitroGLYCERIN (NITROSTAT) 0.4 MG SL tablet PLACE 1 TABLET (0.4 MG TOTAL) UNDER THE TONGUE EVERY 5 (FIVE) MINUTES AS NEEDED FOR CHEST PAIN. 03/02/20   [name], DO  oxymetazoline (AFRIN) 0.05 % nasal spray Place 1 spray into both nostrils 2 (two) times daily as needed for congestion.    [provider]  pantoprazole (PROTONIX) 40 MG tablet Take 1 tablet (40 mg total) by mouth 2 (two) times daily. Patient not taking: Reported on 02/04/2020 07/03/18 01/02/20  Martyn Malay, MD  rosuvastatin (CRESTOR) 20 MG tablet Take 20 mg by mouth at bedtime. 03/26/20   [provider]  rosuvastatin (CRESTOR) 40 MG tablet Take 1 tablet (40 mg total) by mouth  daily. Patient not taking: Reported on 04/01/2020 02/08/20   [name], DO  loratadine (CLARITIN) 10 MG tablet Take 1 tablet (10 mg total) by mouth daily. 03/04/11 05/23/18  Mariana Arn, MD  metoprolol tartrate (LOPRESSOR) 25 MG tablet Take 1 tablet (25 mg total) by mouth 2 (two) times daily. 05/29/19   [name], DO    Allergies    Ace inhibitors, Ampicillin, Penicillins, Codeine, and Lisinopril  Review of Systems   Review of Systems  Respiratory: Positive for shortness of breath.   All other systems reviewed and are negative.   Physical Exam Updated Vital Signs BP (!) 163/96   Pulse (!) 111   Resp (!) 28   SpO2 100%   Physical Exam Vitals and nursing note reviewed.  Constitutional:      General: He is in acute distress.  HENT:     Head: Normocephalic and atraumatic.     Mouth/Throat:     Mouth: Mucous membranes are moist.     Pharynx: Oropharynx is clear.  Eyes:     Extraocular Movements: Extraocular movements intact.     Pupils: Pupils are equal, round, and reactive to light.  Cardiovascular:     Rate and Rhythm: Regular rhythm. Tachycardia present.  Pulmonary:     Effort: Tachypnea and accessory muscle usage present.     Breath sounds: Decreased breath sounds present.  Abdominal:     General: Bowel sounds are normal.     Palpations: Abdomen is soft.  Musculoskeletal:        General: Normal range of motion.     Cervical back: Normal range of motion and neck supple.  Skin:    General: Skin is warm.     Capillary Refill: Capillary refill takes less than 2 seconds.  Neurological:     General: No focal deficit present.     Mental Status: He is alert and oriented to person, place, and time.  Psychiatric:        Mood and Affect: Mood normal.        Behavior: Behavior normal.     ED Results / Procedures / Treatments   Labs (all labs ordered are listed, but only abnormal results are displayed) Labs Reviewed  BASIC METABOLIC PANEL - Abnormal; Notable for  the following components:      Result Value   Glucose, Bld 127 (*)    BUN 6 (*)    All other components within normal limits  CBC WITH DIFFERENTIAL/PLATELET - Abnormal; Notable for the following components:   RBC 3.91 (*)    Hemoglobin 11.7 (*)    HCT 36.8 (*)    RDW 15.6 (*)    All other components within normal limits  LACTIC ACID, PLASMA - Abnormal; Notable for the following components:   Lactic Acid, Venous 2.1 (*)    All other components within normal limits  I-STAT ARTERIAL BLOOD GAS, ED - Abnormal; Notable for the following components:   pCO2 arterial 51.3 (*)    pO2, Arterial 111 (*)    Bicarbonate 31.0 (*)    TCO2 33 (*)    Acid-Base Excess 5.0 (*)    HCT 37.0 (*)    Hemoglobin 12.6 (*)    All other components within normal limits  CULTURE, BLOOD (ROUTINE X 2)  CULTURE, BLOOD (ROUTINE X 2)  SARS CORONAVIRUS 2 BY RT PCR (HOSPITAL ORDER, [place] [facility])  BRAIN NATRIURETIC PEPTIDE  LACTIC ACID, PLASMA    EKG EKG Interpretation  Date/Time:  Saturday May 02 2020 07:42:07 EDT Ventricular Rate:  100 PR Interval:    QRS Duration: 83 QT Interval:  328 QTC Calculation: 423 R Axis:   85 Text Interpretation: Sinus tachycardia Biatrial enlargement Borderline right axis deviation LVH with secondary repolarization abnormality Since last tracing rate faster Confirmed by Isla Pence [phone]) on 05/02/2020 8:16:08 AM   Radiology DG Chest Port 1 View  Result Date: 05/02/2020 CLINICAL DATA:  Shortness of breath EXAM: PORTABLE CHEST 1 VIEW  COMPARISON:  January 31, 2020 chest radiograph; CT angiogram chest December 12, 2019 FINDINGS: There is a stable right pleural effusion with right base atelectasis. A rounded opacity remains at the lateral left base measuring 4.0 x 3.3 cm. Lungs elsewhere are clear. Heart is upper normal in size with pulmonary vascularity. Patient is status post coronary artery bypass grafting. There old healed rib fractures on the right.  IMPRESSION: Stable right pleural effusion with right base atelectasis. Stable nodular appearing opacity lateral left base. Question chronic loculated pleural effusion or rounded atelectasis. Note that this finding was not present on February 2021 study. It may be prudent to correlate with noncontrast enhanced chest CT to further assess this area. Heart upper normal in size with postoperative changes. Old rib trauma on the right. Electronically Signed   By: Lowella Grip III M.D.   On: 05/02/2020 08:35    Procedures Procedures (including critical care time)  Medications Ordered in ED Medications  albuterol (PROVENTIL,VENTOLIN) solution continuous neb (10 mg Nebulization New Bag/Given 05/02/20 0758)  albuterol (PROVENTIL) (2.5 MG/3ML) 0.083% nebulizer solution 5 mg (5 mg Nebulization Given 05/02/20 0746)  ipratropium (ATROVENT) nebulizer solution 0.5 mg (0.5 mg Nebulization Given 05/02/20 0746)  ipratropium (ATROVENT) nebulizer solution 0.5 mg (0.5 mg Nebulization Given 05/02/20 0854)  LORazepam (ATIVAN) injection 0.5 mg (0.5 mg Intravenous Given 05/02/20 0831)    ED Course  I have reviewed the triage vital signs and the nursing notes.  Pertinent labs & imaging results that were available during my care of the patient were reviewed by me and considered in my medical decision making (see chart for details).    MDM Rules/Calculators/A&P                          Pt given several nebs and has improved, but is still not moving air very well.  Pt is put on bipap and looks much more comfortable.   Pt d/w Dr. Higinio Plan (FP) for admission.  CRITICAL CARE Performed by: Isla Pence   Total critical care time: 30 minutes  Critical care time was exclusive of separately billable procedures and treating other patients.  Critical care was necessary to treat or prevent imminent or life-threatening deterioration.  Critical care was time spent personally by me on the following activities: development  of treatment plan with patient and/or surrogate as well as nursing, discussions with consultants, evaluation of patient's response to treatment, examination of patient, obtaining history from patient or surrogate, ordering and performing treatments and interventions, ordering and review of laboratory studies, ordering and review of radiographic studies, pulse oximetry and re-evaluation of patient's condition.   Final Clinical Impression(s) / ED Diagnoses Final diagnoses:  COPD exacerbation ([place])  Acute respiratory failure with hypoxia ([facility])  Tobacco abuse  Pleural effusion, right    Rx / DC Orders ED Discharge Orders    None       Isla Pence, MD 05/02/20 1111

## 2020-05-02 NOTE — Progress Notes (Signed)
Assessed for bipap, pt has nasal packing currently.  No distress noted, VSS, sat 100%, no increased WOB noted. Did not place bipap at this time d/t nasal packing.

## 2020-05-02 NOTE — ED Triage Notes (Signed)
Pt arrives via [facility], BIB for shortness of breath and chest tightness. Pt has hx of COPD, had increased his regular oxygen to 5L and was 90%, up to 97% on NRB for [facility]. Received abuderol, neb, and solumederol PTA. On arrival pt improved some from initial assessment by [facility] but tripoding and using accessory muscles to breathe.

## 2020-05-02 NOTE — H&P (Signed)
Late note entry. Patient seen earlier. I have discussed management plan with the resident and will cosign the resident's note once it is completed.   69 Y/O M with PMX of HTN, HLD, tobacco, and ETOH abuse, COPD presenting with a worsening SOB, cough, and sputum production of a few days duration. He denies chest pain but positive chest tightness and wheezing at home. He denies fever and sick contact. He endorses recurrent nasal bleed, which started four days ago. He had similar episodes in the past. He denies nasal trauma. However, he uses O2 at home, which he felt dries up his nostrils. No other concerns.  Exam: Gen: In mild respiratory distress. HEENT: EOMI, PERRLA, dry blood in his left nostrils and his upper lip. Neuro: Grossly intact. Heart: S1 S2 normal, no murmur. Rapid rate, but normal rhythm. Lungs: Air entry reduced with mild expiratory wheeze. Abd: Flat, soft, NT, +BS. Exp: No edema.  A/P: Acute on chronic hypoxic respiratory failure secondary to COPD Exacerbation Currently on BiPAP S/P IV Solumedrol in the ED. Transition to oral steroid. Duoneb Q2-4 for now. Add Z-pak to his regimen. Review home regimen, and resume when appropriate. Optimize COPD management with LABA/LAMA/ICS Monitor for improvement.  Abnormal Chest xray: ?? Loculated pleural effusion. F/U CT chest done a few months ago showed 11 mm spiculated nodule in the right upper lobe, new from 2019 and most likely bronchogenic carcinoma. Consult the pulmonologist during this admission.  Recurrent Epixstasis Currently asymptomatic. Not on anticoagulant but on antiplatelet.  Monitor closely.  Tachycardia: EKG showed sinus tachy. It is likely related to his work of breathing. Resume home beta-blocker. Monitor closely on telemetry.

## 2020-05-03 ENCOUNTER — Other Ambulatory Visit: Payer: Self-pay

## 2020-05-03 DIAGNOSIS — R040 Epistaxis: Secondary | ICD-10-CM

## 2020-05-03 DIAGNOSIS — R911 Solitary pulmonary nodule: Secondary | ICD-10-CM

## 2020-05-03 DIAGNOSIS — J441 Chronic obstructive pulmonary disease with (acute) exacerbation: Principal | ICD-10-CM

## 2020-05-03 DIAGNOSIS — J9621 Acute and chronic respiratory failure with hypoxia: Secondary | ICD-10-CM

## 2020-05-03 DIAGNOSIS — Z720 Tobacco use: Secondary | ICD-10-CM

## 2020-05-03 LAB — COMPREHENSIVE METABOLIC PANEL
ALT: 30 U/L (ref 0–44)
ALT: 38 U/L (ref 0–44)
AST: 51 U/L — ABNORMAL HIGH (ref 15–41)
AST: 63 U/L — ABNORMAL HIGH (ref 15–41)
Albumin: 3.2 g/dL — ABNORMAL LOW (ref 3.5–5.0)
Albumin: 3.7 g/dL (ref 3.5–5.0)
Alkaline Phosphatase: 77 U/L (ref 38–126)
Alkaline Phosphatase: 74 U/L (ref 38–126)
Anion gap: 8 (ref 5–15)
Anion gap: 10 (ref 5–15)
BUN: 9 mg/dL (ref 8–23)
BUN: 9 mg/dL (ref 8–23)
CO2: 29 mmol/L (ref 22–32)
CO2: 30 mmol/L (ref 22–32)
Calcium: 9.7 mg/dL (ref 8.9–10.3)
Calcium: 10.2 mg/dL (ref 8.9–10.3)
Chloride: 97 mmol/L — ABNORMAL LOW (ref 98–111)
Chloride: 95 mmol/L — ABNORMAL LOW (ref 98–111)
Creatinine, Ser: 0.64 mg/dL (ref 0.61–1.24)
Creatinine, Ser: 0.79 mg/dL (ref 0.61–1.24)
GFR calc Af Amer: >60 mL/min (ref >60)
GFR calc Af Amer: >60 mL/min (ref >60)
GFR calc non Af Amer: >60 mL/min (ref >60)
GFR calc non Af Amer: >60 mL/min (ref >60)
Glucose, Bld: 116 mg/dL — ABNORMAL HIGH (ref 70–99)
Glucose, Bld: 124 mg/dL — ABNORMAL HIGH (ref 70–99)
Potassium: 4 mmol/L (ref 3.5–5.1)
Potassium: 4.5 mmol/L (ref 3.5–5.1)
Sodium: 134 mmol/L — ABNORMAL LOW (ref 135–145)
Sodium: 135 mmol/L (ref 135–145)
Total Bilirubin: 0.8 mg/dL (ref 0.3–1.2)
Total Bilirubin: 0.8 mg/dL (ref 0.3–1.2)
Total Protein: 6.7 g/dL (ref 6.5–8.1)
Total Protein: 7.5 g/dL (ref 6.5–8.1)

## 2020-05-03 LAB — APTT: aPTT: 26 seconds (ref 24–36)

## 2020-05-03 LAB — CBC
HCT: 33 % — ABNORMAL LOW (ref 39.0–52.0)
HCT: 37.2 % — ABNORMAL LOW (ref 39.0–52.0)
Hemoglobin: 10.9 g/dL — ABNORMAL LOW (ref 13.0–17.0)
Hemoglobin: 11.9 g/dL — ABNORMAL LOW (ref 13.0–17.0)
MCH: 30.6 pg (ref 26.0–34.0)
MCH: 30.1 pg (ref 26.0–34.0)
MCHC: 33 g/dL (ref 30.0–36.0)
MCHC: 32 g/dL (ref 30.0–36.0)
MCV: 92.7 fL (ref 80.0–100.0)
MCV: 93.9 fL (ref 80.0–100.0)
Platelets: 142 10*3/uL — ABNORMAL LOW (ref 150–400)
Platelets: 173 10*3/uL (ref 150–400)
RBC: 3.56 MIL/uL — ABNORMAL LOW (ref 4.22–5.81)
RBC: 3.96 MIL/uL — ABNORMAL LOW (ref 4.22–5.81)
RDW: 15.4 % (ref 11.5–15.5)
RDW: 15.5 % (ref 11.5–15.5)
WBC: 3.5 10*3/uL — ABNORMAL LOW (ref 4.0–10.5)
WBC: 5 10*3/uL (ref 4.0–10.5)
nRBC: 0 % (ref 0.0–0.2)
nRBC: 0 % (ref 0.0–0.2)

## 2020-05-03 LAB — GLUCOSE, CAPILLARY
Glucose-Capillary: 105 mg/dL — ABNORMAL HIGH (ref 70–99)
Glucose-Capillary: 109 mg/dL — ABNORMAL HIGH (ref 70–99)
Glucose-Capillary: 114 mg/dL — ABNORMAL HIGH (ref 70–99)
Glucose-Capillary: 114 mg/dL — ABNORMAL HIGH (ref 70–99)
Glucose-Capillary: 138 mg/dL — ABNORMAL HIGH (ref 70–99)

## 2020-05-03 LAB — TYPE AND SCREEN
ABO/RH(D): O POS
Antibody Screen: NEGATIVE

## 2020-05-03 LAB — PROTIME-INR
INR: 1 (ref 0.8–1.2)
Prothrombin Time: 12.7 seconds (ref 11.4–15.2)

## 2020-05-03 MED ORDER — ONDANSETRON HCL 4 MG PO TABS
4.0000 mg | ORAL_TABLET | Freq: Four times a day (QID) | ORAL | Status: DC | PRN
  Administered 2020-05-03: 4 mg via ORAL
  Filled 2020-05-03: qty 1

## 2020-05-03 MED ORDER — IPRATROPIUM-ALBUTEROL 0.5-2.5 (3) MG/3ML IN SOLN: 3.0000 mL | Freq: Two times a day (BID) | RESPIRATORY_TRACT | Status: DC | PRN

## 2020-05-03 MED ORDER — UMECLIDINIUM-VILANTEROL 62.5-25 MCG/INH IN AEPB
1.0000 | INHALATION_SPRAY | Freq: Every day | RESPIRATORY_TRACT | Status: DC
  Administered 2020-05-03 – 2020-05-06 (×3): 1 via RESPIRATORY_TRACT
  Filled 2020-05-03 (×2): qty 14

## 2020-05-03 MED ORDER — IPRATROPIUM-ALBUTEROL 0.5-2.5 (3) MG/3ML IN SOLN: 3.0000 mL | Freq: Two times a day (BID) | RESPIRATORY_TRACT | Status: DC

## 2020-05-03 MED ORDER — INSULIN ASPART 100 UNIT/ML ~~LOC~~ SOLN
0.0000 [IU] | Freq: Every day | SUBCUTANEOUS | Status: DC
Start: 2020-05-03 — End: 2020-05-04

## 2020-05-03 MED ORDER — CEPHALEXIN 500 MG PO CAPS
500.0000 mg | ORAL_CAPSULE | Freq: Two times a day (BID) | ORAL | Status: DC
  Administered 2020-05-03 – 2020-05-06 (×6): 500 mg via ORAL
  Filled 2020-05-03 (×6): qty 1

## 2020-05-03 MED ORDER — INSULIN ASPART 100 UNIT/ML ~~LOC~~ SOLN: 0.0000 [IU] | Freq: Three times a day (TID) | SUBCUTANEOUS | Status: DC

## 2020-05-03 NOTE — Anesthesia Preprocedure Evaluation (Addendum)
Anesthesia Evaluation  Patient identified by MRN, date of birth, ID band Patient awake    Reviewed: Allergy & Precautions, NPO status , Patient's Chart, lab work & pertinent test results  Airway Mallampati: I  TM Distance: >3 FB Neck ROM: Full    Dental no notable dental hx. (+) Poor Dentition, Loose, Missing, Edentulous Upper,    Pulmonary COPD,  COPD inhaler, Current Smoker,    Pulmonary exam normal breath sounds clear to auscultation       Cardiovascular hypertension, Pt. on home beta blockers and Pt. on medications + angina + CAD and + Past MI  Normal cardiovascular exam Rhythm:Regular Rate:Normal  Ef 55-60%   Neuro/Psych  Neuromuscular disease negative psych ROS   GI/Hepatic Neg liver ROS, PUD,   Endo/Other  negative endocrine ROS  Renal/GU negative Renal ROS     Musculoskeletal negative musculoskeletal ROS (+)   Abdominal   Peds  Hematology  (+) anemia ,   Anesthesia Other Findings   Reproductive/Obstetrics                            Anesthesia Physical Anesthesia Plan  ASA: III  Anesthesia Plan: General   Post-op Pain Management:    Induction: Intravenous  PONV Risk Score and Plan: 2 and Ondansetron and Treatment may vary due to age or medical condition  Airway Management Planned: Oral ETT  Additional Equipment: None  Intra-op Plan:   Post-operative Plan: Extubation in OR  Informed Consent: I have reviewed the patients History and Physical, chart, labs and discussed the procedure including the risks, benefits and alternatives for the proposed anesthesia with the patient or authorized representative who has indicated his/her understanding and acceptance.     Dental advisory given  Plan Discussed with:   Anesthesia Plan Comments:        Anesthesia Quick Evaluation

## 2020-05-03 NOTE — Progress Notes (Signed)
Patient had nosebleed this am about 100-150 mL which lasted around 10-15 minutes. MD notified and received order to hold lovenox and apply SCD's for now.   Pt VS stable. Nosebleeds chronic. Will continue to monitor.

## 2020-05-03 NOTE — Progress Notes (Signed)
Family Medicine Teaching Service Daily Progress Note Intern Pager: [phone]  Patient name: Lucas [name] Medical record number: [id number] Date of birth: [birth year] Age: 69 y.o. Gender: male  Primary Care Provider: [name], DO Consultants: Pulmonology  Code Status: FULL  Pt Overview and Major Events to Date:  05/02/20: Admitted  05/03/20: CT c/f bronchiogenic carcinoma   Assessment and Plan: Lucas [name] is a 69 y.o. male who admitted for dyspnea. PMH is significant for COPD on 3L home oxygen, CAD, NSTEMI, CABG x4, most recently 11/2019, tobacco abuse, alcohol abuse, recurrent epistaxis.   Acute on Chronic Respiratory Failure   COPD exacerbation    Bronchiogenic Carcinoma  Reports improvement in his breathing since admission. Stable on home 3L [place].  Remains afebrile.  Etiology likely COPDe vs bronchiogenic carcinoma. CT Chest  consistent with bronchiogenic carcinoma 1.4 x1.4x1.4 cm. Patient did not receive SoluMedrol in ED. Will consult pulmonology for additional recommendations.   -Consult pulmonology Dr. Valeta Harms, appreciate recommendations  -Continuous pulse ox - Maintain O2 sat goal 88-92% - s/p IV Azithromycin 500 at 250 mL/hr x1  - Start PO Azithromycin 250mg  x4days  - Start 40 mg prednisone   - Duoneb q4h  -Follow-up blood cultures: NGTD   Pulmonary lung nodule   Bronchiogenic Carcinoma  Admission chest x-ray notable for nodular appearing opacity in the left lateral base.  CT Chest was obtained and significant for for enlarging spiculated right upper lobe pulmonary nodule consistent with bronchiogenic carcinoma.  Feb 2021 CT notable 11 mm spiculated lung nodule. Patient is a 51 pack year smoker.  Additionally, small loculated pleural effusion in the left anterior lateral osteophytic angle was noted.  Follows with pulmonologist, Dr. Valeta Harms. Was found to have pulmonary nodules in past and was scheduled to have a bronchoscopy with biopsy, however, patient did not make appointment as  he was in the ED for epistaxis.  - Consult pulmonology, appreciate recommendations  - See above   Alcoholic liver Cirrhosis: Chronic, stable. MeldNa score 11, <2% 90-day mortality.  LFTs: AST 51 (mildly elevated), ALT 30,  ALP 77, Tbili 0.8.  INR 1 yesterday. Patient with known alcohol abuse. - AM CMP  -CIWA as below  CAD s/p recent CABG 11/2019: Stable. Patient with CABG earlier this year. Was previously on ASA 325 mg but was reduced to 81 mg due to recurrent nose bleeds.  Follows up with Dr. Martinique, cardiology. - Continue ASA 81 mg - Continue rosuvastatin 20mg    Alcohol Abuse CIWAs 2,3 overnight. Drinks a Therapist, art daily. Last drink was Friday night, 7/9. He denies any prior history of withdrawal or seizures. 0.5 Ativan injection administered in ED. -  Monitor CIWAs -Thiamine, folate, multivitamin  Recurrent anterior epistaxis: Stable. Patient concerned for his intermittent epistaxis outpatient.  States it started after his heart attack. Recently seen in the ED for this several times.  Only on ASA 81 mg. He has follow-up with ENT next week, recommended starting Keflex course on 7/6. -Monitor for epistaxis -Follow-up with ENT as scheduled  -Continue ASA and DVT prophylaxis, can reassess if needed -Will hold Keflex course while on azithromycin  Tobacco Abuse 1 PPD - Nicotine patch 21 mg  -Encouraged tobacco cessation    FEN/GI: heart healthy/carb modified diet Prophylaxis: Lovenox   Disposition: pending pulmonology evaluation   Subjective:  Patient without significant overnight events.  Reports some nausea after eating pudding overnight.  Endorsed improvement of shortness of breath.   Objective: Temp:  [97.9 F (36.6 C)-98.8 F (37.1 C)] 98  F (36.7 C) (07/11 0605) Pulse Rate:  [43-114] 91 (07/11 0025) Resp:  [16-28] 16 (07/11 0605) BP: (127-169)/(73-96) 148/77 (07/11 0025) SpO2:  [95 %-100 %] 100 % (07/11 0223) Weight:  [58.7 kg] 58.7 kg (07/11 6659)  Physical  Exam: General: well appearing elderly male in no acute distress, resting comfortably in bed  Cardiovascular: regular rate and rhythm, no murmur appreciated, midline surgical scar Respiratory: no increased work of breathing, speaking in full sentences without pause, satting well on 3L (baseline), lungs clear though decreased movement at the bases  Abdomen: soft, non tender, non-distended  Extremities: no appreciable edema, no calf tenderness   Laboratory: Recent Labs  Lab 04/28/20 1158 04/28/20 1158 05/02/20 0743 05/02/20 1047 05/03/20 0406  WBC 3.4*  --  6.8  --  3.5*  HGB 12.2*   < > 11.7* 12.6* 10.9*  HCT 38.1*   < > 36.8* 37.0* 33.0*  PLT 192  --  154  --  142*   < > = values in this interval not displayed.   Recent Labs  Lab 05/02/20 0743 05/02/20 1047 05/03/20 0406  NA 140 139 134*  K 4.0 4.0 4.0  CL 99  --  97*  CO2 28  --  29  BUN 6*  --  9  CREATININE 0.71  --  0.64  CALCIUM 9.7  --  9.7  PROT  --   --  6.7  BILITOT  --   --  0.8  ALKPHOS  --   --  77  ALT  --   --  30  AST  --   --  51*  GLUCOSE 127*  --  116*      Imaging/Diagnostic Tests: CT CHEST WO CONTRAST  Result Date: 05/02/2020 CLINICAL DATA:  Shortness of breath, history of spiculated right upper lobe lung nodule concerning for lung cancer, pleural effusion EXAM: CT CHEST WITHOUT CONTRAST TECHNIQUE: Multidetector CT imaging of the chest was performed following the standard protocol without IV contrast. COMPARISON:  12/12/2019, 12/08/2017 FINDINGS: Cardiovascular: Unenhanced imaging of the heart and great vessels demonstrates no pericardial effusion. There is extensive atherosclerosis throughout the coronary vasculature and thoracic aorta. Mediastinum/Nodes: No enlarged mediastinal or axillary lymph nodes. Thyroid gland, trachea, and esophagus demonstrate no significant findings. Postsurgical changes from median sternotomy. Lungs/Pleura: There has been further increase in size of the spiculated left  upper lobe pulmonary nodule, now measuring 1.4 x 1.4 by 1.4 cm. The appearance is most consistent with bronchogenic carcinoma. No other pulmonary nodules or masses. Upper lobe predominant emphysema again noted. No acute airspace disease or pneumothorax. Loculated pleural fluid at the left anterolateral costophrenic angle measures up to 2.2 cm, likely related to interval CABG. Upper Abdomen: No acute abnormality. Musculoskeletal: No acute or destructive bony lesions. Prior healed right rib fractures are noted. Postsurgical changes from median sternotomy. Reconstructed images demonstrate no additional findings. IMPRESSION: 1. Enlarging spiculated right upper lobe pulmonary nodule consistent with bronchogenic carcinoma. 2. Small loculated pleural effusion along the left anterolateral costophrenic angle, measuring up to 2.2 cm in greatest thickness. This is likely related to interval CABG. 3. Aortic Atherosclerosis (ICD10-I70.0) and Emphysema (ICD10-J43.9). These results will be called to the ordering clinician or representative by the Radiologist Assistant, and communication documented in the PACS or [employer]. Electronically Signed   By: Randa Ngo M.D.   On: 05/02/2020 21:44   DG Chest Port 1 View  Result Date: 05/02/2020 CLINICAL DATA:  Shortness of breath EXAM: PORTABLE CHEST 1 VIEW  COMPARISON:  January 31, 2020 chest radiograph; CT angiogram chest December 12, 2019 FINDINGS: There is a stable right pleural effusion with right base atelectasis. A rounded opacity remains at the lateral left base measuring 4.0 x 3.3 cm. Lungs elsewhere are clear. Heart is upper normal in size with pulmonary vascularity. Patient is status post coronary artery bypass grafting. There old healed rib fractures on the right. IMPRESSION: Stable right pleural effusion with right base atelectasis. Stable nodular appearing opacity lateral left base. Question chronic loculated pleural effusion or rounded atelectasis. Note that this  finding was not present on February 2021 study. It may be prudent to correlate with noncontrast enhanced chest CT to further assess this area. Heart upper normal in size with postoperative changes. Old rib trauma on the right. Electronically Signed   By: Lowella Grip III M.D.   On: 05/02/2020 08:35     [name], DO 05/03/2020, 6:24 AM PGY-2, [facility] Intern pager: [phone], text pages welcome

## 2020-05-03 NOTE — H&P (View-Only) (Signed)
NAME:  Lucas [name], MRN:  [id number], DOB:  1951/04/20, LOS: 1 ADMISSION DATE:  05/02/2020, CONSULTATION DATE:  05/03/20 REFERRING MD:  Gwendlyn Deutscher, CHIEF COMPLAINT:  Lung mass   Brief History   Enlarging spiculated RUL nodule  History of present illness   Lucas [name] is a 69 y/o gentleman with an enlarging spiculated lung nodule (discovered Feb 2018) and COPD who was admitted for an AE COPD with progressive shortness of breath and chest tightness.  He has a history of tobacco use and recently underwent a CABG this year. Since his CABG he has had recurrent epistaxis and required nasal packing to be placed this admission.  Due to recurrent epistaxis evaluation and management he had to delay his lung biopsy in May. He is hopeful to be able to get this done so he can be treated. He continues to smoke 1 ppd, down from 3ppd at his heaviest. He wants to quit.  Uses Anoro once daily and albuterol every 4 hours as needed at home.  Shortness of breath has improved since he was admitted.  His oxygen is down to 3 L nasal cannula saturating in the upper 90s, which is his nocturnal and as needed oxygen requirement at home, but he does not need all the time.  Past Medical History  Lung nodule COPD CAD s/p CABG Chronic resp fail- 3L HOT  Ongoing tobacco abuse HTN  Significant Hospital Events     Consults:  PCCM  Procedures:    Significant Diagnostic Tests:  CT chest- RUL 1.4 x 1.4 cm nodule. No obvious mediastinal adenopathy. Emphysema.  Micro Data:  covid negative 7/10 blood cx>>  Antimicrobials:  Azithromycin 7/10> Keflex 7/10  Interim history/subjective:    Objective   Blood pressure 117/79, pulse 76, temperature 98 F (36.7 C), temperature source Oral, resp. rate 18, height 5\' 11"  (1.803 m), weight 58.7 kg, SpO2 100 %.       No intake or output data in the 24 hours ending 05/03/20 1502 Filed Weights   05/02/20 1627 05/03/20 0605  Weight: 58.7 kg 58.7 kg    Examination: General:  frail, chronically ill appearing elderly man laying in bed in NAD, appears stated age. HENT: [place]/AT, eyes anicteric, oral mucosa moist. Missing several teeth. Lungs: barrel chest, breathing comfortably on 3L [place]. Distant breath sounds, no wheezing. Cardiovascular: RRR, no murmurs Abdomen: thin, soft Extremities: no edema Neuro: awake and alert, answering questions appropriately, moving all extremities Lymphatics: no cervical or supraclavicular adenopathy  Resolved Hospital Problem list     Assessment & Plan:  Spiculated right upper lobe nodule concerning for primary lung cancer -Navigational bronchoscopy should be performed this admission to reduce risk of further delaying diagnosis.  Tentatively scheduled for tomorrow.  N.p.o. past midnight.  No PTA AC or AP medications. -Strongly recommend smoking cessation  Tobacco abuse-ongoing 1 pack/day, down from 3 packs/day -Commended him on his success with cutting back. -Discussed strategies to work on quitting.  Nicotine patches make him want to smoke more.  He is nervous about starting Chantix due to potential side effects.  We discussed that the side effects are short-term associated with medication use and resolve with discontinuation if they occur.  He will consider if he would be willing to try this medication to help with his smoking cessation efforts.  Acute hypoxic respiratory failure due to acute exacerbation of COPD -Agree with restarting Anoro once daily. -Continue steroids and azithromycin to complete 5 days -Continue bronchodilators as needed -Titrate down supplemental oxygen  as required to maintain SPO2 88 to 92%.  Epistaxis -Nasal packing in place -Needs beta-lactam antibiotic for SSS and TSS prophylaxis  Best practice:  Per primary  Labs   CBC: Recent Labs  Lab 04/28/20 1158 05/02/20 0743 05/02/20 1047 05/03/20 0406  WBC 3.4* 6.8  --  3.5*  NEUTROABS  --  3.6  --   --   HGB 12.2* 11.7* 12.6* 10.9*  HCT 38.1* 36.8*  37.0* 33.0*  MCV 91.8 94.1  --  92.7  PLT 192 154  --  142*    Basic Metabolic Panel: Recent Labs  Lab 05/02/20 0743 05/02/20 1047 05/03/20 0406  NA 140 139 134*  K 4.0 4.0 4.0  CL 99  --  97*  CO2 28  --  29  GLUCOSE 127*  --  116*  BUN 6*  --  9  CREATININE 0.71  --  0.64  CALCIUM 9.7  --  9.7   GFR: Estimated Creatinine Clearance: 72.4 mL/min (by C-G formula based on SCr of 0.64 mg/dL). Recent Labs  Lab 04/28/20 1158 05/02/20 0743 05/02/20 0744 05/02/20 1146 05/03/20 0406  WBC 3.4* 6.8  --   --  3.5*  LATICACIDVEN  --   --  2.1* 3.1*  --     Liver Function Tests: Recent Labs  Lab 05/03/20 0406  AST 51*  ALT 30  ALKPHOS 77  BILITOT 0.8  PROT 6.7  ALBUMIN 3.2*   No results for input(s): LIPASE, AMYLASE in the last 168 hours. No results for input(s): AMMONIA in the last 168 hours.  ABG    Component Value Date/Time   PHART 7.389 05/02/2020 1047   PCO2ART 51.3 (H) 05/02/2020 1047   PO2ART 111 (H) 05/02/2020 1047   HCO3 31.0 (H) 05/02/2020 1047   TCO2 33 (H) 05/02/2020 1047   ACIDBASEDEF 2.0 12/16/2019 2233   O2SAT 98.0 05/02/2020 1047     Coagulation Profile: Recent Labs  Lab 05/02/20 1221  INR 1.0    Cardiac Enzymes: No results for input(s): CKTOTAL, CKMB, CKMBINDEX, TROPONINI in the last 168 hours.  HbA1C: Hgb A1c MFr Bld  Date/Time Value Ref Range Status  05/02/2020 12:28 PM 5.5 4.8 - 5.6 % Final    Comment:    (NOTE) Pre diabetes:          5.7%-6.4%  Diabetes:              >6.4%  Glycemic control for   <7.0% adults with diabetes   12/13/2019 03:31 AM 6.1 (H) 4.8 - 5.6 % Final    Comment:    (NOTE) Pre diabetes:          5.7%-6.4% Diabetes:              >6.4% Glycemic control for   <7.0% adults with diabetes     CBG: Recent Labs  Lab 05/02/20 1814 05/02/20 2151 05/03/20 0021 05/03/20 0731 05/03/20 1145  GLUCAP 223* 165* 138* 109* 114*    Review of Systems:    Review of Systems: (bold if positive, otherwise  negative)  General: fevers, chills, sweats HENT: rhinorrhea, congestion, sore throat, epistaxis Eyes: blurry vision, double vision Cardio: chest pain, palpitations, edema Pulm: wheezing, cough, sputum production, hemoptysis, SOB, chest tightness Abd: heartburn, nausea, vomiting, diarrhea, bloody stools, abdominal pain GU: hematuria, dysuria Derm: rashes, wounds Heme/Lymph: adenopathy, bruising Neuro: syncope, headache, vertigo, numbness, weakness   Past Medical History  He,  has a past medical history of Acute blood loss anemia (06/21/2018), Acute upper  GI bleed (06/21/2018), Allergy, Bilateral AVN of femurs ([place]) (06/25/2018), COPD (12/21/2006), Environmental allergies, History of femur fracture (06/25/2018), History of Fracture of right tibia (06/26/2018), History of multiple right thoacic rib fractures (06/25/2018), History of Right clavicular fracture (06/25/2018), HYPERLIPIDEMIA (12/21/2006), HYPERTENSION, BENIGN SYSTEMIC (12/21/2006), Mallory-Weiss tear, and TOBACCO DEPENDENCE (12/21/2006).   Surgical History    Past Surgical History:  Procedure Laterality Date  . ANKLE SURGERY     left ankle  . APPLICATION OF WOUND VAC  01/06/2012   Procedure: APPLICATION OF WOUND VAC;  Surgeon: Meredith Pel, MD;  Location: [facility];  Service: Orthopedics;  Laterality: Left;  . APPLICATION OF WOUND VAC Right 10/17/2015   Procedure: APPLICATION OF WOUND VAC;  Surgeon: Leandrew Koyanagi, MD;  Location: [facility];  Service: Orthopedics;  Laterality: Right;  . BIOPSY  06/22/2018   Procedure: BIOPSY;  Surgeon: [id number] v, DO;  Location: [facility];  Service: Gastroenterology;;  . CORONARY ARTERY BYPASS GRAFT N/A 12/16/2019   Procedure: CORONARY ARTERY BYPASS GRAFTING (CABG) TIMES 4 USING LEFT INTERNAL MAMMARY ARTERY AND ENDOSCOPICALLY HARVESTED RIGHT SAPHENOUS VEIN;  Surgeon: Lajuana Matte, MD;  Location: [facility];  Service: Open Heart Surgery;  Laterality: N/A;  . ESOPHAGOGASTRODUODENOSCOPY (EGD) WITH PROPOFOL  N/A 06/22/2018   Procedure: ESOPHAGOGASTRODUODENOSCOPY (EGD) WITH PROPOFOL;  Surgeon: [id number] v, DO;  Location: [facility];  Service: Gastroenterology;  Laterality: N/A;  . EXTERNAL FIXATION LEG  01/06/2012   Procedure: EXTERNAL FIXATION LEG;  Surgeon: Meredith Pel, MD;  Location: [facility];  Service: Orthopedics;  Laterality: Left;  Smith-nephew external fixator set  . EXTERNAL FIXATION LEG Right 09/16/2015   Procedure: EXTERNAL FIXATION LEG/LARGE;  Surgeon: Mcarthur Rossetti, MD;  Location: [facility];  Service: Orthopedics;  Laterality: Right;  . EXTERNAL FIXATION REMOVAL Right 09/22/2015   Procedure: REMOVAL EXTERNAL FIXATION LEG;  Surgeon: Mcarthur Rossetti, MD;  Location: [facility];  Service: Orthopedics;  Laterality: Right;  . FASCIOTOMY  01/10/2012   Procedure: FASCIOTOMY;  Surgeon: Rozanna Box, MD;  Location: [facility];  Service: Orthopedics;  Laterality: Left;  status post fasciotomy with wound vac left calf; adjustment external fixator left tibia under fluoro; retention suture/wound vac placement left lateral calf wound  . FASCIOTOMY  01/06/2012   Procedure: FASCIOTOMY;  Surgeon: Meredith Pel, MD;  Location: [facility];  Service: Orthopedics;  Laterality: Left;  Four compartment fasciotomy  . HOT HEMOSTASIS N/A 06/22/2018   Procedure: HEMOSTASIS;  Surgeon: [id number] v, DO;  Location: [facility];  Service: Gastroenterology;  Laterality: N/A;  Epinepherine and clip placed  . I & D EXTREMITY  01/12/2012   Procedure: IRRIGATION AND DEBRIDEMENT EXTREMITY;  Surgeon: Rozanna Box, MD;  Location: [facility];  Service: Orthopedics;  Laterality: Left;  LAYERD CLOSURE LEFT LEG WOUND  . I & D EXTREMITY Right 10/17/2015   Procedure: IRRIGATION AND DEBRIDEMENT EXTREMITY;  Surgeon: Leandrew Koyanagi, MD;  Location: [facility];  Service: Orthopedics;  Laterality: Right;  . I & D EXTREMITY Right 10/20/2015   Procedure: REPEAT IRRIGATION AND DEBRIDEMENT EXTREMITY, right lower leg;  Surgeon:  Mcarthur Rossetti, MD;  Location: [facility];  Service: Orthopedics;  Laterality: Right;  . KNEE SURGERY     left knee and right knee  . LEFT HEART CATH AND CORONARY ANGIOGRAPHY N/A 06/27/2018   Procedure: LEFT HEART CATH AND CORONARY ANGIOGRAPHY;  Surgeon: Martinique, Peter M, MD;  Location: [facility];  Service: Cardiovascular;  Laterality: N/A;  . LEFT HEART CATH AND CORONARY ANGIOGRAPHY N/A 12/12/2019  Procedure: LEFT HEART CATH AND CORONARY ANGIOGRAPHY;  Surgeon: Wellington Hampshire, MD;  Location: [facility];  Service: Cardiovascular;  Laterality: N/A;  . MASS EXCISION Left 09/22/2015   Procedure: EXCISION MASS;  Surgeon: Mcarthur Rossetti, MD;  Location: [facility];  Service: Orthopedics;  Laterality: Left;  . ORIF TIBIA PLATEAU  02/07/2012   Procedure: OPEN REDUCTION INTERNAL FIXATION (ORIF) TIBIAL PLATEAU;  Surgeon: Rozanna Box, MD;  Location: [facility];  Service: Orthopedics;  Laterality: Left;  ORIF LEFT TIBIAL PLATEAU/ REMOVE EXTERNAL FIXATION LEFT LEG  . ORIF TIBIA PLATEAU Right 09/22/2015   Procedure: REMOVAL OF EXTERNAL FIXATOR RIGHT LEG, OPEN REDUCTION INTERNAL FIXATION (ORIF) RIGHT TIBIAL PLATEAU, EXCISION SMALL MASS LEFT LEG;  Surgeon: Mcarthur Rossetti, MD;  Location: [facility];  Service: Orthopedics;  Laterality: Right;  . SKIN SPLIT GRAFT Right 10/23/2015   Procedure: RIGHT GASTROC FLAP SPLIT THICKNESS SKIN GRAFT FROM RIGHT THING TO RIGHT LEG;  Surgeon: Irene Limbo, MD;  Location: [facility];  Service: Plastics;  Laterality: Right;  . TEE WITHOUT CARDIOVERSION N/A 12/16/2019   Procedure: TRANSESOPHAGEAL ECHOCARDIOGRAM (TEE);  Surgeon: Lajuana Matte, MD;  Location: [facility];  Service: Open Heart Surgery;  Laterality: N/A;  . WRIST SURGERY     left     Social History   reports that he has been smoking cigarettes. He has a 51.00 pack-year smoking history. He quit smokeless tobacco use about 55 years ago.  His smokeless tobacco use included snuff and chew. He reports  current alcohol use of about 6.0 standard drinks of alcohol per week. He reports that he does not use drugs.   Family History   His family history includes Emphysema in his father.   Allergies Allergies  Allergen Reactions  . Ace Inhibitors Swelling  . Ampicillin Nausea And Vomiting  . Penicillins Nausea And Vomiting    Did it involve swelling of the face/tongue/throat, SOB, or low BP? N Did it involve sudden or severe rash/hives, skin peeling, or any reaction on the inside of your mouth or nose? N Did you need to seek medical attention at a hospital or doctor's office? N When did it last happen?Teenager If all above answers are "NO", may proceed with cephalosporin use.  . Codeine Nausea And Vomiting  . Lisinopril Swelling    REACTION: angioedema     Home Medications  Prior to Admission medications   Medication Sig Start Date End Date Taking? Authorizing Provider  albuterol (PROAIR HFA) 108 (90 Base) MCG/ACT inhaler INHALE 2 PUFFS BY MOUTH EVERY 4 HOURS AS NEEDED FOR WHEEZE OR FOR SHORTNESS OF BREATH Patient taking differently: Inhale 2 puffs into the lungs every 4 (four) hours as needed for wheezing or shortness of breath.  02/08/20  Yes Brimage, Vondra, DO  ANORO ELLIPTA 62.5-25 MCG/INH AEPB INHALE 1 PUFF INTO THE LUNGS DAILY Patient taking differently: Inhale 1 puff into the lungs daily.  03/06/20  Yes Brimage, Vondra, DO  aspirin EC 81 MG tablet Take 81 mg by mouth daily.   Yes [provider]  bisacodyl (BISACODYL) 5 MG EC tablet Take 15 mg by mouth daily as needed for moderate constipation.   Yes [provider]  fluticasone (FLONASE) 50 MCG/ACT nasal spray SPRAY 2 SPRAYS INTO EACH NOSTRIL EVERY DAY Patient taking differently: Place 1 spray into both nostrils daily as needed for allergies.  07/19/18  Yes Riccio, Angela C, DO  ibuprofen (ADVIL) 200 MG tablet Take 600 mg by mouth daily as needed (pain).  Yes [provider]  losartan (COZAAR) 25 MG  tablet Take 0.5 tablets (12.5 mg total) by mouth daily. 12/23/19  Yes Barrett, Erin R, PA-C  metoprolol tartrate (LOPRESSOR) 25 MG tablet TAKE 1.5 TABLETS (37.5 MG TOTAL) BY MOUTH 2 (TWO) TIMES DAILY. 12/27/19  Yes Lightfoot, Lucile Crater, MD  nitroGLYCERIN (NITROSTAT) 0.4 MG SL tablet PLACE 1 TABLET (0.4 MG TOTAL) UNDER THE TONGUE EVERY 5 (FIVE) MINUTES AS NEEDED FOR CHEST PAIN. 03/02/20  Yes Brimage, Vondra, DO  OXYGEN Inhale 3-4 L into the lungs See admin instructions. Continuous at night, as needed during the day   Yes [provider]  oxymetazoline (AFRIN) 0.05 % nasal spray Place 1 spray into both nostrils 2 (two) times daily as needed for congestion (nose bleeds).    Yes [provider]  rosuvastatin (CRESTOR) 20 MG tablet Take 20 mg by mouth at bedtime. 03/26/20  Yes [provider]  cephALEXin (KEFLEX) 500 MG capsule Take 1 capsule (500 mg total) by mouth 2 (two) times daily for 7 days. 04/28/20 05/05/20  Alveria Apley, PA-C  guaiFENesin (MUCINEX) 600 MG 12 hr tablet Take 1 tablet (600 mg total) by mouth 2 (two) times daily as needed. Patient not taking: Reported on 04/01/2020 12/23/19   Barrett, Erin R, PA-C  montelukast (SINGULAIR) 10 MG tablet TAKE 1 TABLET BY MOUTH EVERYDAY AT BEDTIME Patient not taking: Reported on 04/01/2020 11/27/19   [name], DO  pantoprazole (PROTONIX) 40 MG tablet Take 1 tablet (40 mg total) by mouth 2 (two) times daily. Patient not taking: Reported on 02/04/2020 07/03/18 01/02/20  Martyn Malay, MD  rosuvastatin (CRESTOR) 40 MG tablet Take 1 tablet (40 mg total) by mouth daily. Patient not taking: Reported on 04/01/2020 02/08/20   [name], DO  loratadine (CLARITIN) 10 MG tablet Take 1 tablet (10 mg total) by mouth daily. 03/04/11 05/23/18  Mariana Arn, MD  metoprolol tartrate (LOPRESSOR) 25 MG tablet Take 1 tablet (25 mg total) by mouth 2 (two) times daily. 05/29/19   [name], DO     Julian Hy, DO 05/03/20 4:08 PM [facility]

## 2020-05-03 NOTE — Consult Note (Signed)
NAME:  JAROD BOZZO, MRN:  [id number], DOB:  1951/05/01, LOS: 1 ADMISSION DATE:  05/02/2020, CONSULTATION DATE:  05/03/20 REFERRING MD:  Gwendlyn Deutscher, CHIEF COMPLAINT:  Lung mass   Brief History   Enlarging spiculated RUL nodule  History of present illness   Mr. Richter is a 69 y/o gentleman with an enlarging spiculated lung nodule (discovered Feb 2018) and COPD who was admitted for an AE COPD with progressive shortness of breath and chest tightness.  He has a history of tobacco use and recently underwent a CABG this year. Since his CABG he has had recurrent epistaxis and required nasal packing to be placed this admission.  Due to recurrent epistaxis evaluation and management he had to delay his lung biopsy in May. He is hopeful to be able to get this done so he can be treated. He continues to smoke 1 ppd, down from 3ppd at his heaviest. He wants to quit.  Uses Anoro once daily and albuterol every 4 hours as needed at home.  Shortness of breath has improved since he was admitted.  His oxygen is down to 3 L nasal cannula saturating in the upper 90s, which is his nocturnal and as needed oxygen requirement at home, but he does not need all the time.  Past Medical History  Lung nodule COPD CAD s/p CABG Chronic resp fail- 3L HOT  Ongoing tobacco abuse HTN  Significant Hospital Events     Consults:  PCCM  Procedures:    Significant Diagnostic Tests:  CT chest- RUL 1.4 x 1.4 cm nodule. No obvious mediastinal adenopathy. Emphysema.  Micro Data:  covid negative 7/10 blood cx>>  Antimicrobials:  Azithromycin 7/10> Keflex 7/10  Interim history/subjective:    Objective   Blood pressure 117/79, pulse 76, temperature 98 F (36.7 C), temperature source Oral, resp. rate 18, height 5\' 11"  (1.803 m), weight 58.7 kg, SpO2 100 %.       No intake or output data in the 24 hours ending 05/03/20 1502 Filed Weights   05/02/20 1627 05/03/20 0605  Weight: 58.7 kg 58.7 kg    Examination: General:  frail, chronically ill appearing elderly man laying in bed in NAD, appears stated age. HENT: [place]/AT, eyes anicteric, oral mucosa moist. Missing several teeth. Lungs: barrel chest, breathing comfortably on 3L [place]. Distant breath sounds, no wheezing. Cardiovascular: RRR, no murmurs Abdomen: thin, soft Extremities: no edema Neuro: awake and alert, answering questions appropriately, moving all extremities Lymphatics: no cervical or supraclavicular adenopathy  Resolved Hospital Problem list     Assessment & Plan:  Spiculated right upper lobe nodule concerning for primary lung cancer -Navigational bronchoscopy should be performed this admission to reduce risk of further delaying diagnosis.  Tentatively scheduled for tomorrow.  N.p.o. past midnight.  No PTA AC or AP medications. -Strongly recommend smoking cessation  Tobacco abuse-ongoing 1 pack/day, down from 3 packs/day -Commended him on his success with cutting back. -Discussed strategies to work on quitting.  Nicotine patches make him want to smoke more.  He is nervous about starting Chantix due to potential side effects.  We discussed that the side effects are short-term associated with medication use and resolve with discontinuation if they occur.  He will consider if he would be willing to try this medication to help with his smoking cessation efforts.  Acute hypoxic respiratory failure due to acute exacerbation of COPD -Agree with restarting Anoro once daily. -Continue steroids and azithromycin to complete 5 days -Continue bronchodilators as needed -Titrate down supplemental oxygen  as required to maintain SPO2 88 to 92%.  Epistaxis -Nasal packing in place -Needs beta-lactam antibiotic for SSS and TSS prophylaxis  Best practice:  Per primary  Labs   CBC: Recent Labs  Lab 04/28/20 1158 05/02/20 0743 05/02/20 1047 05/03/20 0406  WBC 3.4* 6.8  --  3.5*  NEUTROABS  --  3.6  --   --   HGB 12.2* 11.7* 12.6* 10.9*  HCT 38.1* 36.8*  37.0* 33.0*  MCV 91.8 94.1  --  92.7  PLT 192 154  --  142*    Basic Metabolic Panel: Recent Labs  Lab 05/02/20 0743 05/02/20 1047 05/03/20 0406  NA 140 139 134*  K 4.0 4.0 4.0  CL 99  --  97*  CO2 28  --  29  GLUCOSE 127*  --  116*  BUN 6*  --  9  CREATININE 0.71  --  0.64  CALCIUM 9.7  --  9.7   GFR: Estimated Creatinine Clearance: 72.4 mL/min (by C-G formula based on SCr of 0.64 mg/dL). Recent Labs  Lab 04/28/20 1158 05/02/20 0743 05/02/20 0744 05/02/20 1146 05/03/20 0406  WBC 3.4* 6.8  --   --  3.5*  LATICACIDVEN  --   --  2.1* 3.1*  --     Liver Function Tests: Recent Labs  Lab 05/03/20 0406  AST 51*  ALT 30  ALKPHOS 77  BILITOT 0.8  PROT 6.7  ALBUMIN 3.2*   No results for input(s): LIPASE, AMYLASE in the last 168 hours. No results for input(s): AMMONIA in the last 168 hours.  ABG    Component Value Date/Time   PHART 7.389 05/02/2020 1047   PCO2ART 51.3 (H) 05/02/2020 1047   PO2ART 111 (H) 05/02/2020 1047   HCO3 31.0 (H) 05/02/2020 1047   TCO2 33 (H) 05/02/2020 1047   ACIDBASEDEF 2.0 12/16/2019 2233   O2SAT 98.0 05/02/2020 1047     Coagulation Profile: Recent Labs  Lab 05/02/20 1221  INR 1.0    Cardiac Enzymes: No results for input(s): CKTOTAL, CKMB, CKMBINDEX, TROPONINI in the last 168 hours.  HbA1C: Hgb A1c MFr Bld  Date/Time Value Ref Range Status  05/02/2020 12:28 PM 5.5 4.8 - 5.6 % Final    Comment:    (NOTE) Pre diabetes:          5.7%-6.4%  Diabetes:              >6.4%  Glycemic control for   <7.0% adults with diabetes   12/13/2019 03:31 AM 6.1 (H) 4.8 - 5.6 % Final    Comment:    (NOTE) Pre diabetes:          5.7%-6.4% Diabetes:              >6.4% Glycemic control for   <7.0% adults with diabetes     CBG: Recent Labs  Lab 05/02/20 1814 05/02/20 2151 05/03/20 0021 05/03/20 0731 05/03/20 1145  GLUCAP 223* 165* 138* 109* 114*    Review of Systems:    Review of Systems: (bold if positive, otherwise  negative)  General: fevers, chills, sweats HENT: rhinorrhea, congestion, sore throat, epistaxis Eyes: blurry vision, double vision Cardio: chest pain, palpitations, edema Pulm: wheezing, cough, sputum production, hemoptysis, SOB, chest tightness Abd: heartburn, nausea, vomiting, diarrhea, bloody stools, abdominal pain GU: hematuria, dysuria Derm: rashes, wounds Heme/Lymph: adenopathy, bruising Neuro: syncope, headache, vertigo, numbness, weakness   Past Medical History  He,  has a past medical history of Acute blood loss anemia (06/21/2018), Acute upper  GI bleed (06/21/2018), Allergy, Bilateral AVN of femurs ([place]) (06/25/2018), COPD (12/21/2006), Environmental allergies, History of femur fracture (06/25/2018), History of Fracture of right tibia (06/26/2018), History of multiple right thoacic rib fractures (06/25/2018), History of Right clavicular fracture (06/25/2018), HYPERLIPIDEMIA (12/21/2006), HYPERTENSION, BENIGN SYSTEMIC (12/21/2006), Mallory-Weiss tear, and TOBACCO DEPENDENCE (12/21/2006).   Surgical History    Past Surgical History:  Procedure Laterality Date  . ANKLE SURGERY     left ankle  . APPLICATION OF WOUND VAC  01/06/2012   Procedure: APPLICATION OF WOUND VAC;  Surgeon: Meredith Pel, MD;  Location: [facility];  Service: Orthopedics;  Laterality: Left;  . APPLICATION OF WOUND VAC Right 10/17/2015   Procedure: APPLICATION OF WOUND VAC;  Surgeon: Leandrew Koyanagi, MD;  Location: [place];  Service: Orthopedics;  Laterality: Right;  . BIOPSY  06/22/2018   Procedure: BIOPSY;  Surgeon: [id number] v, DO;  Location: [facility];  Service: Gastroenterology;;  . CORONARY ARTERY BYPASS GRAFT N/A 12/16/2019   Procedure: CORONARY ARTERY BYPASS GRAFTING (CABG) TIMES 4 USING LEFT INTERNAL MAMMARY ARTERY AND ENDOSCOPICALLY HARVESTED RIGHT SAPHENOUS VEIN;  Surgeon: Lajuana Matte, MD;  Location: [place];  Service: Open Heart Surgery;  Laterality: N/A;  . ESOPHAGOGASTRODUODENOSCOPY (EGD) WITH PROPOFOL  N/A 06/22/2018   Procedure: ESOPHAGOGASTRODUODENOSCOPY (EGD) WITH PROPOFOL;  Surgeon: [id number] v, DO;  Location: [facility];  Service: Gastroenterology;  Laterality: N/A;  . EXTERNAL FIXATION LEG  01/06/2012   Procedure: EXTERNAL FIXATION LEG;  Surgeon: Meredith Pel, MD;  Location: [facility];  Service: Orthopedics;  Laterality: Left;  Smith-nephew external fixator set  . EXTERNAL FIXATION LEG Right 09/16/2015   Procedure: EXTERNAL FIXATION LEG/LARGE;  Surgeon: Mcarthur Rossetti, MD;  Location: [place];  Service: Orthopedics;  Laterality: Right;  . EXTERNAL FIXATION REMOVAL Right 09/22/2015   Procedure: REMOVAL EXTERNAL FIXATION LEG;  Surgeon: Mcarthur Rossetti, MD;  Location: [place];  Service: Orthopedics;  Laterality: Right;  . FASCIOTOMY  01/10/2012   Procedure: FASCIOTOMY;  Surgeon: Rozanna Box, MD;  Location: [place];  Service: Orthopedics;  Laterality: Left;  status post fasciotomy with wound vac left calf; adjustment external fixator left tibia under fluoro; retention suture/wound vac placement left lateral calf wound  . FASCIOTOMY  01/06/2012   Procedure: FASCIOTOMY;  Surgeon: Meredith Pel, MD;  Location: [facility];  Service: Orthopedics;  Laterality: Left;  Four compartment fasciotomy  . HOT HEMOSTASIS N/A 06/22/2018   Procedure: HEMOSTASIS;  Surgeon: [id number] v, DO;  Location: [facility];  Service: Gastroenterology;  Laterality: N/A;  Epinepherine and clip placed  . I & D EXTREMITY  01/12/2012   Procedure: IRRIGATION AND DEBRIDEMENT EXTREMITY;  Surgeon: Rozanna Box, MD;  Location: [place];  Service: Orthopedics;  Laterality: Left;  LAYERD CLOSURE LEFT LEG WOUND  . I & D EXTREMITY Right 10/17/2015   Procedure: IRRIGATION AND DEBRIDEMENT EXTREMITY;  Surgeon: Leandrew Koyanagi, MD;  Location: [place];  Service: Orthopedics;  Laterality: Right;  . I & D EXTREMITY Right 10/20/2015   Procedure: REPEAT IRRIGATION AND DEBRIDEMENT EXTREMITY, right lower leg;  Surgeon:  Mcarthur Rossetti, MD;  Location: [place];  Service: Orthopedics;  Laterality: Right;  . KNEE SURGERY     left knee and right knee  . LEFT HEART CATH AND CORONARY ANGIOGRAPHY N/A 06/27/2018   Procedure: LEFT HEART CATH AND CORONARY ANGIOGRAPHY;  Surgeon: Martinique, Peter M, MD;  Location: [facility];  Service: Cardiovascular;  Laterality: N/A;  . LEFT HEART CATH AND CORONARY ANGIOGRAPHY N/A 12/12/2019  Procedure: LEFT HEART CATH AND CORONARY ANGIOGRAPHY;  Surgeon: Wellington Hampshire, MD;  Location: [facility];  Service: Cardiovascular;  Laterality: N/A;  . MASS EXCISION Left 09/22/2015   Procedure: EXCISION MASS;  Surgeon: Mcarthur Rossetti, MD;  Location: [place];  Service: Orthopedics;  Laterality: Left;  . ORIF TIBIA PLATEAU  02/07/2012   Procedure: OPEN REDUCTION INTERNAL FIXATION (ORIF) TIBIAL PLATEAU;  Surgeon: Rozanna Box, MD;  Location: [place];  Service: Orthopedics;  Laterality: Left;  ORIF LEFT TIBIAL PLATEAU/ REMOVE EXTERNAL FIXATION LEFT LEG  . ORIF TIBIA PLATEAU Right 09/22/2015   Procedure: REMOVAL OF EXTERNAL FIXATOR RIGHT LEG, OPEN REDUCTION INTERNAL FIXATION (ORIF) RIGHT TIBIAL PLATEAU, EXCISION SMALL MASS LEFT LEG;  Surgeon: Mcarthur Rossetti, MD;  Location: [place];  Service: Orthopedics;  Laterality: Right;  . SKIN SPLIT GRAFT Right 10/23/2015   Procedure: RIGHT GASTROC FLAP SPLIT THICKNESS SKIN GRAFT FROM RIGHT THING TO RIGHT LEG;  Surgeon: Irene Limbo, MD;  Location: [place];  Service: Plastics;  Laterality: Right;  . TEE WITHOUT CARDIOVERSION N/A 12/16/2019   Procedure: TRANSESOPHAGEAL ECHOCARDIOGRAM (TEE);  Surgeon: Lajuana Matte, MD;  Location: [place];  Service: Open Heart Surgery;  Laterality: N/A;  . WRIST SURGERY     left     Social History   reports that he has been smoking cigarettes. He has a 51.00 pack-year smoking history. He quit smokeless tobacco use about 55 years ago.  His smokeless tobacco use included snuff and chew. He reports  current alcohol use of about 6.0 standard drinks of alcohol per week. He reports that he does not use drugs.   Family History   His family history includes Emphysema in his father.   Allergies Allergies  Allergen Reactions  . Ace Inhibitors Swelling  . Ampicillin Nausea And Vomiting  . Penicillins Nausea And Vomiting    Did it involve swelling of the face/tongue/throat, SOB, or low BP? N Did it involve sudden or severe rash/hives, skin peeling, or any reaction on the inside of your mouth or nose? N Did you need to seek medical attention at a hospital or doctor's office? N When did it last happen?Teenager If all above answers are "NO", may proceed with cephalosporin use.  . Codeine Nausea And Vomiting  . Lisinopril Swelling    REACTION: angioedema     Home Medications  Prior to Admission medications   Medication Sig Start Date End Date Taking? Authorizing Provider  albuterol (PROAIR HFA) 108 (90 Base) MCG/ACT inhaler INHALE 2 PUFFS BY MOUTH EVERY 4 HOURS AS NEEDED FOR WHEEZE OR FOR SHORTNESS OF BREATH Patient taking differently: Inhale 2 puffs into the lungs every 4 (four) hours as needed for wheezing or shortness of breath.  02/08/20  Yes Brimage, Vondra, DO  ANORO ELLIPTA 62.5-25 MCG/INH AEPB INHALE 1 PUFF INTO THE LUNGS DAILY Patient taking differently: Inhale 1 puff into the lungs daily.  03/06/20  Yes Brimage, Vondra, DO  aspirin EC 81 MG tablet Take 81 mg by mouth daily.   Yes [provider]  bisacodyl (BISACODYL) 5 MG EC tablet Take 15 mg by mouth daily as needed for moderate constipation.   Yes [provider]  fluticasone (FLONASE) 50 MCG/ACT nasal spray SPRAY 2 SPRAYS INTO EACH NOSTRIL EVERY DAY Patient taking differently: Place 1 spray into both nostrils daily as needed for allergies.  07/19/18  Yes Riccio, Angela C, DO  ibuprofen (ADVIL) 200 MG tablet Take 600 mg by mouth daily as needed (pain).  Yes [provider]  losartan (COZAAR) 25 MG  tablet Take 0.5 tablets (12.5 mg total) by mouth daily. 12/23/19  Yes Barrett, Erin R, PA-C  metoprolol tartrate (LOPRESSOR) 25 MG tablet TAKE 1.5 TABLETS (37.5 MG TOTAL) BY MOUTH 2 (TWO) TIMES DAILY. 12/27/19  Yes Lightfoot, Lucile Crater, MD  nitroGLYCERIN (NITROSTAT) 0.4 MG SL tablet PLACE 1 TABLET (0.4 MG TOTAL) UNDER THE TONGUE EVERY 5 (FIVE) MINUTES AS NEEDED FOR CHEST PAIN. 03/02/20  Yes Brimage, Vondra, DO  OXYGEN Inhale 3-4 L into the lungs See admin instructions. Continuous at night, as needed during the day   Yes [provider]  oxymetazoline (AFRIN) 0.05 % nasal spray Place 1 spray into both nostrils 2 (two) times daily as needed for congestion (nose bleeds).    Yes [provider]  rosuvastatin (CRESTOR) 20 MG tablet Take 20 mg by mouth at bedtime. 03/26/20  Yes [provider]  cephALEXin (KEFLEX) 500 MG capsule Take 1 capsule (500 mg total) by mouth 2 (two) times daily for 7 days. 04/28/20 05/05/20  Alveria Apley, PA-C  guaiFENesin (MUCINEX) 600 MG 12 hr tablet Take 1 tablet (600 mg total) by mouth 2 (two) times daily as needed. Patient not taking: Reported on 04/01/2020 12/23/19   Barrett, Erin R, PA-C  montelukast (SINGULAIR) 10 MG tablet TAKE 1 TABLET BY MOUTH EVERYDAY AT BEDTIME Patient not taking: Reported on 04/01/2020 11/27/19   [name], DO  pantoprazole (PROTONIX) 40 MG tablet Take 1 tablet (40 mg total) by mouth 2 (two) times daily. Patient not taking: Reported on 02/04/2020 07/03/18 01/02/20  Martyn Malay, MD  rosuvastatin (CRESTOR) 40 MG tablet Take 1 tablet (40 mg total) by mouth daily. Patient not taking: Reported on 04/01/2020 02/08/20   [name], DO  loratadine (CLARITIN) 10 MG tablet Take 1 tablet (10 mg total) by mouth daily. 03/04/11 05/23/18  Mariana Arn, MD  metoprolol tartrate (LOPRESSOR) 25 MG tablet Take 1 tablet (25 mg total) by mouth 2 (two) times daily. 05/29/19   [name], DO     Julian Hy, DO 05/03/20 4:08 PM [facility]

## 2020-05-04 ENCOUNTER — Inpatient Hospital Stay (HOSPITAL_COMMUNITY): Payer: Medicare Other

## 2020-05-04 ENCOUNTER — Inpatient Hospital Stay (HOSPITAL_COMMUNITY): Payer: Medicare Other | Admitting: Anesthesiology

## 2020-05-04 ENCOUNTER — Encounter (HOSPITAL_COMMUNITY): Payer: Self-pay | Admitting: Family Medicine

## 2020-05-04 ENCOUNTER — Encounter (HOSPITAL_COMMUNITY)
Admission: EM | Disposition: A | Discharge status: Home Health | Payer: Self-pay | Source: Home / Self Care | Attending: Family Medicine

## 2020-05-04 ENCOUNTER — Other Ambulatory Visit: Payer: Self-pay

## 2020-05-04 DIAGNOSIS — R918 Other nonspecific abnormal finding of lung field: Secondary | ICD-10-CM

## 2020-05-04 HISTORY — PX: BRONCHIAL NEEDLE ASPIRATION BIOPSY: SHX5106

## 2020-05-04 HISTORY — PX: BRONCHIAL BRUSHINGS: SHX5108

## 2020-05-04 HISTORY — PX: VIDEO BRONCHOSCOPY WITH ENDOBRONCHIAL NAVIGATION: SHX6175

## 2020-05-04 HISTORY — PX: BRONCHIAL WASHINGS: SHX5105

## 2020-05-04 HISTORY — PX: FIDUCIAL MARKER PLACEMENT: SHX6858

## 2020-05-04 HISTORY — PX: BRONCHIAL BIOPSY: SHX5109

## 2020-05-04 LAB — CBC
HCT: 35.4 % — ABNORMAL LOW (ref 39.0–52.0)
Hemoglobin: 11.5 g/dL — ABNORMAL LOW (ref 13.0–17.0)
MCH: 30.4 pg (ref 26.0–34.0)
MCHC: 32.5 g/dL (ref 30.0–36.0)
MCV: 93.7 fL (ref 80.0–100.0)
Platelets: 150 10*3/uL (ref 150–400)
RBC: 3.78 MIL/uL — ABNORMAL LOW (ref 4.22–5.81)
RDW: 15.5 % (ref 11.5–15.5)
WBC: 5.1 10*3/uL (ref 4.0–10.5)
nRBC: 0 % (ref 0.0–0.2)

## 2020-05-04 LAB — BASIC METABOLIC PANEL
Anion gap: 12 (ref 5–15)
BUN: 12 mg/dL (ref 8–23)
CO2: 25 mmol/L (ref 22–32)
Calcium: 9.6 mg/dL (ref 8.9–10.3)
Chloride: 100 mmol/L (ref 98–111)
Creatinine, Ser: 0.7 mg/dL (ref 0.61–1.24)
GFR calc Af Amer: >60 mL/min (ref >60)
GFR calc non Af Amer: >60 mL/min (ref >60)
Glucose, Bld: 91 mg/dL (ref 70–99)
Potassium: 4.2 mmol/L (ref 3.5–5.1)
Sodium: 137 mmol/L (ref 135–145)

## 2020-05-04 SURGERY — VIDEO BRONCHOSCOPY WITH ENDOBRONCHIAL NAVIGATION
Anesthesia: General | Laterality: Right

## 2020-05-04 MED ORDER — PHENYLEPHRINE 40 MCG/ML (10ML) SYRINGE FOR IV PUSH (FOR BLOOD PRESSURE SUPPORT)
PREFILLED_SYRINGE | INTRAVENOUS | Status: DC | PRN
  Administered 2020-05-04: 40 ug via INTRAVENOUS
  Administered 2020-05-04 (×2): 80 ug via INTRAVENOUS

## 2020-05-04 MED ORDER — ONDANSETRON HCL 4 MG/2ML IJ SOLN
INTRAMUSCULAR | Status: DC | PRN
  Administered 2020-05-04: 4 mg via INTRAVENOUS

## 2020-05-04 MED ORDER — ENSURE ENLIVE PO LIQD
237.0000 mL | Freq: Three times a day (TID) | ORAL | Status: DC
  Administered 2020-05-05 – 2020-05-06 (×2): 237 mL via ORAL

## 2020-05-04 MED ORDER — LACTATED RINGERS IV SOLN: INTRAVENOUS | Status: DC | PRN

## 2020-05-04 MED ORDER — LIDOCAINE 2% (20 MG/ML) 5 ML SYRINGE
INTRAMUSCULAR | Status: DC | PRN
  Administered 2020-05-04: 100 mg via INTRAVENOUS

## 2020-05-04 MED ORDER — FENTANYL CITRATE (PF) 100 MCG/2ML IJ SOLN
INTRAMUSCULAR | Status: AC
  Filled 2020-05-04: qty 2

## 2020-05-04 MED ORDER — FENTANYL CITRATE (PF) 250 MCG/5ML IJ SOLN
INTRAMUSCULAR | Status: DC | PRN
  Administered 2020-05-04: 100 ug via INTRAVENOUS

## 2020-05-04 MED ORDER — PROPOFOL 10 MG/ML IV BOLUS
INTRAVENOUS | Status: DC | PRN
  Administered 2020-05-04: 100 mg via INTRAVENOUS

## 2020-05-04 MED ORDER — PHENYLEPHRINE HCL-NACL 10-0.9 MG/250ML-% IV SOLN
INTRAVENOUS | Status: DC | PRN
  Administered 2020-05-04: 25 ug/min via INTRAVENOUS

## 2020-05-04 MED ORDER — ROCURONIUM BROMIDE 10 MG/ML (PF) SYRINGE
PREFILLED_SYRINGE | INTRAVENOUS | Status: DC | PRN
  Administered 2020-05-04: 40 mg via INTRAVENOUS
  Administered 2020-05-04: 60 mg via INTRAVENOUS

## 2020-05-04 MED ORDER — SUGAMMADEX SODIUM 200 MG/2ML IV SOLN
INTRAVENOUS | Status: DC | PRN
  Administered 2020-05-04 (×2): 200 mg via INTRAVENOUS

## 2020-05-04 SURGICAL SUPPLY — 47 items

## 2020-05-04 NOTE — Transfer of Care (Signed)
Immediate Anesthesia Transfer of Care Note  Patient: Lucas [name]  Procedure(s) Performed: VIDEO BRONCHOSCOPY WITH ENDOBRONCHIAL NAVIGATION (Right ) BRONCHIAL BRUSHINGS BRONCHIAL NEEDLE ASPIRATION BIOPSIES BRONCHIAL BIOPSIES BRONCHIAL WASHINGS FIDUCIAL MARKER PLACEMENT  Patient Location: Endoscopy Unit  Anesthesia Type:General  Level of Consciousness: awake, alert  and oriented  Airway & Oxygen Therapy: Patient Spontanous Breathing and Patient connected to nasal cannula oxygen  Post-op Assessment: Report given to RN and Post -op Vital signs reviewed and stable  Post vital signs: Reviewed and stable  Last Vitals:  Vitals Value Taken Time  BP 162/75 05/04/20 1429  Temp    Pulse 90 05/04/20 1431  Resp 25 05/04/20 1431  SpO2 95 % 05/04/20 1431  Vitals shown include unvalidated device data.  Last Pain:  Vitals:   05/04/20 1229  TempSrc: Oral  PainSc: 0-No pain      Patients Stated Pain Goal: 0 (58/31/67 4255)  Complications: No complications documented.

## 2020-05-04 NOTE — Progress Notes (Signed)
Brief progress note. I will cosign resident's note once it is completed.  Doing well today. No new concerns. He is awaiting bronchoscopy and biopsy of his lung mass.  Acute on chronic hypoxic respiratory failure secondary to COPD Exacerbation now at his baseline O2 requirement. Continue A/B and Steroid taper. Resume home regimen. Monitor for improvement.  Lung mass: R/O Bronchogenic CA. Bronchoscopy and biopsy today.  Tobacco dependency: Severity clinically Undetermined Counseling completed. Nicotine patch recommended.

## 2020-05-04 NOTE — Anesthesia Postprocedure Evaluation (Signed)
Anesthesia Post Note  Patient: Lucas [name]  Procedure(s) Performed: VIDEO BRONCHOSCOPY WITH ENDOBRONCHIAL NAVIGATION (Right ) BRONCHIAL BRUSHINGS BRONCHIAL NEEDLE ASPIRATION BIOPSIES BRONCHIAL BIOPSIES BRONCHIAL WASHINGS FIDUCIAL MARKER PLACEMENT     Patient location during evaluation: Endoscopy Anesthesia Type: General Level of consciousness: awake and alert Pain management: pain level controlled Vital Signs Assessment: post-procedure vital signs reviewed and stable Respiratory status: spontaneous breathing, nonlabored ventilation, respiratory function stable and patient connected to nasal cannula oxygen Cardiovascular status: blood pressure returned to baseline and stable Postop Assessment: no apparent nausea or vomiting Anesthetic complications: no   No complications documented.  Last Vitals:  Vitals:   05/04/20 1429 05/04/20 1439  BP: (!) 162/75 131/78  Pulse: 89 91  Resp: (!) 23 (!) 25  Temp: 37.1 C   SpO2: 100% 94%    Last Pain:  Vitals:   05/04/20 1439  TempSrc:   PainSc: 0-No pain                 Barnet Glasgow

## 2020-05-04 NOTE — Progress Notes (Signed)
PCCM:  Prelim path adequate for tissue diagnosis.  I have messaged radiation oncology to facilitate outpatient follow up.  Likely benefit from SBRT.  I have called and updated the patients wife.   Garner Nash, DO [facility] 05/04/2020 2:44 PM

## 2020-05-04 NOTE — Plan of Care (Signed)

## 2020-05-04 NOTE — Anesthesia Procedure Notes (Signed)
Procedure Name: Intubation Performed by: Valda Favia, CRNA Pre-anesthesia Checklist: Patient identified, Emergency Drugs available, Suction available and Patient being monitored Patient Re-evaluated:Patient Re-evaluated prior to induction Oxygen Delivery Method: Circle System Utilized Preoxygenation: Pre-oxygenation with 100% oxygen Induction Type: IV induction Ventilation: Mask ventilation without difficulty Laryngoscope Size: Mac and 4 Grade View: Grade I Tube type: Oral Tube size: 8.5 mm Number of attempts: 1 Airway Equipment and Method: Stylet and Oral airway Placement Confirmation: ETT inserted through vocal cords under direct vision,  positive ETCO2 and breath sounds checked- equal and bilateral Secured at: 23 cm Tube secured with: Tape Dental Injury: Teeth and Oropharynx as per pre-operative assessment

## 2020-05-04 NOTE — Progress Notes (Signed)
Dr Valeta Harms notified cxr completed, ok to go back to floor per dr icard

## 2020-05-04 NOTE — Progress Notes (Signed)
Family Medicine Teaching Service Daily Progress Note Intern Pager: [phone]  Patient name: Lucas [name] Medical record number: [id number] Date of birth: 11-10-1950 Age: 69 y.o. Gender: male  Primary Care Provider: [name], DO Consultants: Pulmonology Code Status: FULL  Pt Overview and Major Events to Date:  Admitted 7/10 Bronchoscopy 7/12  Assessment and Plan: Lucas [name] is a 69 y.o. male who admitted for dyspnea. PMH is significant forCOPD on 3L home oxygen, CAD, NSTEMI,CABG x4, most recently 11/2019, tobacco abuse, alcohol abuse, recurrent epistaxis.   Acute on Chronic Respiratory Failure  COPD exacerbation   Bronchiogenic Carcinoma  Reports improvement in his breathing since admission. Satting 98% on 2L in room.  Remains afebrile.  Etiology likely COPD vs bronchiogenic carcinoma. CT Chest  consistent with bronchiogenic carcinoma 1.4 x1.4x1.4 cm. Bronchoscopy scheduled today.  - bronchoscopy results pending -Continuous pulse ox - Oxygen decreased to 1L, consider weaning off O2 sat goal 88-92% - s/p IV Azithromycin 500 at 250 mL/hr x1  - Start PO Azithromycin 250mg  through 7/14 - Start 40 mg prednisone through 7/14 - Duoneb BID PRN wheezing, SOB -Blood cultures: NGTD  Pulmonary lung nodule  Bronchiogenic Carcinoma  Admission chest x-ray notable for nodular appearing opacity in the left lateral base.  CT Chest was obtained and significant for for enlarging spiculated right upper lobe pulmonary nodule consistent with bronchiogenic carcinoma.  Feb 2021 CT notable 11 mm spiculated lung nodule. Patient is a 51 pack year smoker.  Additionally, small loculated pleural effusion in the left anterior lateral osteophytic angle was noted.  Follows with pulmonologist, Dr. Valeta Harms. Was found to have pulmonary nodules in past and was scheduled to have a bronchoscopy with biopsy, however, patient did not make appointment as he was in the ED for epistaxis.  - Bronchoscopy today - Will  need to follow with pulmonologist  Alcoholic liver Cirrhosis: Chronic, stable. MeldNa score 11, <2% 90-day mortality.  LFTs: AST 51 (mildly elevated), ALT 30,  ALP 77, Tbili 0.8.  INR 1 yesterday. Patient with known alcohol abuse. - AM CMP  -CIWA as below - thiamine 100mg    - Folic acid 1 mg  CAD s/p recent CABG 11/2019: Stable. Patient with CABG earlier this year. Was previously on ASA 325 mg but was reduced to 81 mg due to recurrent nose bleeds.  Follows up with Dr. [place], cardiology. - Continue ASA 81 mg - Continue rosuvastatin 20mg    Alcohol Abuse CIWAs 2,3 overnight. Drinks a Therapist, art daily. Last drink was Friday night, 7/9. He denies any prior history of withdrawal or seizures. 0.5 Ativan injection administered in ED. -  Monitor CIWAs -Thiamine, folate, multivitamin  Recurrent anterior epistaxis: Stable. Patient concerned for his intermittent epistaxis outpatient.  States it started after his heart attack. Recently seen in the ED for this several times.  Only on ASA 81 mg. His nose is packed 7/6, on Keflex for ppx -Monitor for epistaxis -Follow-up with ENT as scheduled outpatient  -Continue ASA and DVT prophylaxis, can reassess if needed -Will hold Keflex course while on azithromycin  Tobacco Abuse 51 pack year smoker. 1 PPD now, previously 3 PPD - Nicotine patch 21 mg  - Patient is interested in Chantix, will discharge with this   FEN/GI: heart healthy/carb modified diet Prophylaxis: Lovenox  Disposition: Discharge likely tomorrow   Subjective:  Patient states he is feeling "so much better". He does not feel short of breath or have chest pain. He continues to have a cough. He states he has belly pains  only because he isn't able to eat until his procedure. His nose is still packed and he has had no further bleeding since he has been here.   Objective: Temp:  [97.7 F (36.5 C)-98.1 F (36.7 C)] 98.1 F (36.7 C) (07/12 1229) Pulse Rate:  [72-95] 89 (07/12  1229) Resp:  [18-20] 20 (07/12 1229) BP: (112-131)/(63-77) 128/77 (07/12 1229) SpO2:  [95 %-97 %] 97 % (07/12 1229) Weight:  [57.4 kg] 57.4 kg (07/12 0404) Physical Exam: General: awake, alert, no acute distress Cardiovascular: RRR, no murmurs appreciated Respiratory: decreased air movement throughout, wheezing in right lower bases. Satting 98% 1L [place] Abdomen: soft, nontender to palpation Extremities: no edema, 2+ DP and radial pulses   Laboratory: Recent Labs  Lab 05/03/20 0406 05/03/20 1749 05/04/20 0325  WBC 3.5* 5.0 5.1  HGB 10.9* 11.9* 11.5*  HCT 33.0* 37.2* 35.4*  PLT 142* 173 150   Recent Labs  Lab 05/03/20 0406 05/03/20 1749 05/04/20 0325  NA 134* 135 137  K 4.0 4.5 4.2  CL 97* 95* 100  CO2 29 30 25   BUN 9 9 12   CREATININE 0.64 0.79 0.70  CALCIUM 9.7 10.2 9.6  PROT 6.7 7.5  --   BILITOT 0.8 0.8  --   ALKPHOS 77 74  --   ALT 30 38  --   AST 51* 63*  --   GLUCOSE 116* 124* 91    Imaging/Diagnostic Tests: CT Chest without contrast 7/10 IMPRESSION: 1. Enlarging spiculated right upper lobe pulmonary nodule consistent with bronchogenic carcinoma. 2. Small loculated pleural effusion along the left anterolateral costophrenic angle, measuring up to 2.2 cm in greatest thickness. This is likely related to interval CABG. 3. Aortic Atherosclerosis (ICD10-I70.0) and Emphysema (ICD10-J43.9).  Chest x-ray 7/10 IMPRESSION: Stable right pleural effusion with right base atelectasis. Stable nodular appearing opacity lateral left base. Question chronic loculated pleural effusion or rounded atelectasis. Note that this finding was not present on February 2021 study. It may be prudent to correlate with noncontrast enhanced chest CT to further assess this area. Heart upper normal in size with postoperative changes. Old rib trauma on the right.  Sharion Settler, DO 05/04/2020, 2:22 PM PGY-1, [place] Intern pager: [phone], text pages  welcome

## 2020-05-04 NOTE — Progress Notes (Signed)
Initial Nutrition Assessment  DOCUMENTATION CODES:   Underweight  INTERVENTION:  Ensure Enlive po TID, each supplement provides 350 kcal and 20 grams of protein  Magic cup TID with meals, each supplement provides 290 kcal and 9 grams of protein  Continue MVI daily   NUTRITION DIAGNOSIS:   Increased nutrient needs related to chronic illness (COPD exacerbation; suspected carcinoma) as evidenced by estimated needs.    GOAL:   Patient will meet greater than or equal to 90% of their needs    MONITOR:   PO intake, Supplement acceptance, Weight trends, Labs, I & O's  REASON FOR ASSESSMENT:   Malnutrition Screening Tool    ASSESSMENT:   Pt with an enlarging spiculated lung nodule concerning for primary lung cancer was admitted with acute on chronic respiratory failure 2/2 COPD exacerbation vs bronchiogenic carcinoma. PMH includes COPD on 3L O2, CAD s/p CABG  Pt unavailable at time of RD visit. Pt having bronchoscopy and biopsy of lung mass today.   Per H&P, pt drinks a 6 pack of beer daily.  Reviewed wt hx. Pt with potential 8.6% wt loss x1 week and potential 13% wt loss x 3 months, both significant for time frame. Unable to confirm wt loss at this time.   Suspect pt is malnourished.  Unable to diagnose malnutrition without detailed diet/wt history and/or nutrition-focused physical exam.   No PO Intake documented.   UOP: 637ml x24 hours I/O: +661ml since admit  Labs reviewed. Medications: Ensure Enlive BID, Folvite, MVI, Deltasone, Thiamine  NUTRITION - FOCUSED PHYSICAL EXAM:  Unable to perform at this time, will attempt at follow-up.   Diet Order:   Diet Order            Diet Heart Room service appropriate? Yes; Fluid consistency: Thin  Diet effective now                 EDUCATION NEEDS:   No education needs have been identified at this time  Skin:  Skin Assessment: Reviewed RN Assessment  Last BM:  7/10  Height:   Ht Readings from Last 1  Encounters:  05/02/20 5\' 11"  (1.803 m)    Weight:   Wt Readings from Last 1 Encounters:  05/04/20 57.4 kg    BMI:  Body mass index is 17.66 kg/m.  Estimated Nutritional Needs:   Kcal:  1850-2050  Protein:  90-105 grams  Fluid:  >1.8L/d    Larkin Ina, MS, RD, LDN RD pager number and weekend/on-call pager number located in [place].

## 2020-05-04 NOTE — Op Note (Signed)
Navigational Bronchoscopy Procedure Note Bronchoscopic fiducial placement procedure note  Lucas [name]  [id number]  [birth year]  Date:05/04/20  Time:2:33 PM   Provider Performing:Brittanni Cariker L Nikkia Devoss   Procedure: Video Bronchoscopy with Electromagnetic Navigation - Guided Biopsies Bronchoscopic guided fiducial placement   Indication(s)  right upper lobe pulmonary nodule, 14 mm  Consent Risks of the procedure as well as the alternatives and risks of each were explained to the patient and/or caregiver.  Consent for the procedure was obtained.  Anesthesia General Anesthesia  Time Out Verified patient identification, verified procedure, site/side was marked, verified correct patient position, special equipment/implants available, medications/allergies/relevant history reviewed, required imaging and test results available.  Sterile Technique Usual hand hygiene, masks, gowns, and gloves were used  Procedure Description Prior to the date of the procedure a high-resolution CT scan of the chest was performed. Utilizing super Dimension software a virtual tracheobronchial tree was generated to allow the creation of distinct navigation pathways to the patient's parenchymal abnormalities. After being taken to the operating room general anesthesia was initiated and the patient was orally intubated. The video fiberoptic bronchoscope was introduced via the endotracheal tube and a general inspection was performed which showed normal right and left lung anatomy with scattered bronchial pitting evidence of tobacco abuse however no endobronchial lesion.  He does have a few areas off of the tracheal and bronchial rings consistent with TBO. The extendable working channel and locator guide were introduced into the bronchoscope. The distinct navigation pathways prepared prior to this procedure were then utilized to navigate to within 0.7 cm of patient's lesion(s) identified on CT scan.  A complete fluoroscopic sweep  for local registration was completed under inspiratory breath-hold 25 APL, from RAO 25 degrees to LAO 25 degrees. The extendable working channel was secured into place and the locator guide was withdrawn. Under fluoroscopic guidance transbronchial needle brushings, transbronchial Wang needle biopsies, and transbronchial forceps biopsies were performed to be sent for cytology and pathology.  Following tissue sampling the locatable guide was reinserted into the airway and 3 fiducials were placed using the fiducial delivery catheter set in axial plane surrounding the lesion.  All 3 fiducials were placed at approximately 2.5 -3 cm from target center. A bronchioalveolar lavage was performed in the right upper lobe and sent for cytology. At the end of the procedure a general airway inspection was performed and there was no evidence of active bleeding. The bronchoscope was removed.   Complications/Tolerance None; patient tolerated the procedure well. Chest X-ray is ordered to confirm no post-procedural complication.  EBL Minimal, <1cc  Specimen(s) 1. Transbronchial needle brushings from right upper lobe 2. Transbronchial Wang needle biopsies from right upper lobe 3. Transbronchial forceps biopsies from right upper lobe 4. Bronchoalveolar lavage from right upper lobe  Pathology: Adequate for tissue diagnosis  Recommendations: Ambulatory referral to radiation oncology for consideration of SBRT.  Lucas Nash, DO [facility] 05/04/2020 2:37 PM

## 2020-05-04 NOTE — Interval H&P Note (Signed)
History and Physical Interval Note:  05/04/2020 12:53 PM  Lucas [name]  has presented today for surgery, with the diagnosis of lung mass.  The various methods of treatment have been discussed with the patient and family. After consideration of risks, benefits and other options for treatment, the patient has consented to  Procedure(s): [place] (Right) as a surgical intervention.  The patient's history has been reviewed, patient examined, no change in status, stable for surgery.  I have reviewed the patient's chart and labs.  Questions were answered to the patient's satisfaction.    We met in pre-op. Discussed risks and benefits and alternatives. Patient is agreeable to proceed. We discussed risks of bleeding, pneumothorax and death.   [place]

## 2020-05-05 LAB — CBC
HCT: 35.4 % — ABNORMAL LOW (ref 39.0–52.0)
Hemoglobin: 11.5 g/dL — ABNORMAL LOW (ref 13.0–17.0)
MCH: 30.1 pg (ref 26.0–34.0)
MCHC: 32.5 g/dL (ref 30.0–36.0)
MCV: 92.7 fL (ref 80.0–100.0)
Platelets: 159 10*3/uL (ref 150–400)
RBC: 3.82 MIL/uL — ABNORMAL LOW (ref 4.22–5.81)
RDW: 14.6 % (ref 11.5–15.5)
WBC: 4.5 10*3/uL (ref 4.0–10.5)
nRBC: 0 % (ref 0.0–0.2)

## 2020-05-05 LAB — BASIC METABOLIC PANEL
Anion gap: 9 (ref 5–15)
BUN: 9 mg/dL (ref 8–23)
CO2: 29 mmol/L (ref 22–32)
Calcium: 9.8 mg/dL (ref 8.9–10.3)
Chloride: 98 mmol/L (ref 98–111)
Creatinine, Ser: 0.72 mg/dL (ref 0.61–1.24)
GFR calc Af Amer: >60 mL/min (ref >60)
GFR calc non Af Amer: >60 mL/min (ref >60)
Glucose, Bld: 104 mg/dL — ABNORMAL HIGH (ref 70–99)
Potassium: 3.8 mmol/L (ref 3.5–5.1)
Sodium: 136 mmol/L (ref 135–145)

## 2020-05-05 NOTE — Progress Notes (Signed)
PT had indicated he still has his nasal packing on his Left nare. Request MD to follow-up and address prior to discharge.

## 2020-05-05 NOTE — Hospital Course (Addendum)
69 y.o. male p/w COPD exacerbation and enlarged pulm nodule, also recurrent epistaxis. Below is his brief hospital course listed by problem. Refer to H&P for additional information.   Acute on Chronic Respiratory Failure 2/2 COPD Exacerbation  Requiring BiPAP on arrival. Improved with continuous albuterol and IV Solu-Medrol and weaned back to 3L [place].  Received a 5-day course of azithromycin and 40 mg prednisone.  Symptomatically improved.  No longer required BiPAP or increased oxygen per [place].   Pulmonolgy Lung Nodule on CT  Abnormal CXR  Patient is a 51-pack-year smoker.  Previous history of a pulmonary nodule on CT 11/2019.  Was previously scheduled for a bronchoscopy but was unable to make the appointment due to epistaxis.  Repeat CT showed enlarging spiculated right upper lobe pulmonary nodule consistent with bronchogenic carcinoma.  Bronchoscopy performed 7/13 and multiple biopsies obtained.  Referral placed for oncology outpatient.  Alcohol Abuse and Alcoholic Liver Cirrhosis 6 pack per day daily drinker. Received 0.5 Ativan injection in ED. CIWA scores stable at 2 and 3 throughout admission. MeldNa score 11 with a <2% 90-day mortality. Thiamine and folic acid administered. Patient remained stable throughout [place].  Epistaxis Left anterior nare packed on 7/6. Placed on Keflex for prophylaxis. Packing removed 7/14. No recurrance of epistaxis. Patient to follow up with ENT in 3-4 weeks. Instructed to use saline nasal spray and to not blow his nose.  Other chronic conditions stable.

## 2020-05-05 NOTE — Progress Notes (Signed)
SATURATION QUALIFICATIONS: (This note is used to comply with regulatory documentation for home oxygen)  Patient Saturations on Room Air at Rest = 92%  Patient Saturations on Room Air while Ambulating = 85%  Patient Saturations on 3 Liters of oxygen while Ambulating = 91%  Please briefly explain why patient needs home oxygen: Pt requires 3L supplemental O2 via [place] to maintain SaO2 >90%O2 with ambulation.  Rakeisha Nyce B. Migdalia Dk PT, DPT Acute Rehabilitation Services Pager [phone] Office [phone]

## 2020-05-05 NOTE — Evaluation (Signed)
Physical Therapy Evaluation Patient Details Name: Lucas [name] MRN: [id number] DOB: January 26, 1951 Today's Date: 05/05/2020   History of Present Illness  69 Y/O M with PMX of HTN, HLD, tobacco, and ETOH abuse, s/p CABG in 2021, COPD on 3L O2 at home presenting with a worsening SOB, cough, and sputum production of a few days duration. Admitted 05/02/20 for treatment of Acute on Chronic Respiratory Failure 2/2 COPD exacerbation. s/p 7/12 bronchoscopy with biopsy to r/o bronchogenic CA  Clinical Impression  PTA pt living with wife in single story apartment with 2 steps to enter. Pt reports independence with ambulation. Pt reports independently washing up at the sink and dressing himself, pt with limited community ambulation due to fact that the O2 tank will not hold a charge. Pt is mod I for bed mobility, supervision for transfers and min guard for ambulation of 75 feet. PT recommends HHPT to work on strength, endurance and energy conservation to improve limited community ambulation. PT will continue to follow acutely.     Follow Up Recommendations Home health PT;Supervision for mobility/OOB    Equipment Recommendations  None recommended by PT       Precautions / Restrictions Precautions Precautions: None Restrictions Weight Bearing Restrictions: No      Mobility  Bed Mobility Overal bed mobility: Modified Independent             General bed mobility comments: use of bed rail and increased time and effort  Transfers Overall transfer level: Needs assistance   Transfers: Sit to/from Stand Sit to Stand: Supervision         General transfer comment: supervision for safety with power up and steadying, vc for taking a second to assess stability before beginning ambulation   Ambulation/Gait Ambulation/Gait assistance: Min guard Gait Distance (Feet): 150 Feet Assistive device: None Gait Pattern/deviations: Step-through pattern;Shuffle Gait velocity: slowed Gait velocity  interpretation: <1.8 ft/sec, indicate of risk for recurrent falls General Gait Details: min guard for safety decreased L knee extension with stance phase due to L:TKA. mild instability, no overt LoB, pt requires 1x standing rest break due to 4/4 DoE, recovers within 1 min and is able to ambulate back to the room      Balance Overall balance assessment: Mild deficits observed, not formally tested                                           Pertinent Vitals/Pain Pain Assessment: No/denies pain    Home Living Family/patient expects to be discharged to:: Private residence Living Arrangements: Spouse/significant other Available Help at Discharge: Family;Available 24 hours/day Type of Home: Apartment Home Access: Stairs to enter Entrance Stairs-Rails: None Entrance Stairs-Number of Steps: 2 Home Layout: One level Home Equipment: Walker - 2 wheels;Crutches;Cane - single point;Bedside commode      Prior Function Level of Independence: Independent                  Extremity/Trunk Assessment   Upper Extremity Assessment Upper Extremity Assessment: Overall WFL for tasks assessed    Lower Extremity Assessment Lower Extremity Assessment: RLE deficits/detail;LLE deficits/detail RLE Deficits / Details: ROM WFL, strength grossly 4/5 RLE Sensation: history of peripheral neuropathy RLE Coordination: decreased fine motor LLE Deficits / Details: L TKA, lacking full extension , strength in hip and ankle grossly 4/49m knee 3+/5 LLE Sensation: history of peripheral neuropathy LLE Coordination: decreased fine motor  Communication   Communication: No difficulties  Cognition Arousal/Alertness: Awake/alert Behavior During Therapy: WFL for tasks assessed/performed Overall Cognitive Status: Within Functional Limits for tasks assessed                                        General Comments General comments (skin integrity, edema, etc.): Pt on 2L O2  via [place] on entry SaO2 96%O2 with ambulation requires 3L O2 via [place] to maintain SaO2 >90%O2        Assessment/Plan    PT Assessment Patient needs continued PT services  PT Problem List Decreased activity tolerance;Decreased mobility;Cardiopulmonary status limiting activity;Impaired sensation       PT Treatment Interventions DME instruction;Gait training;Functional mobility training;Therapeutic activities;Therapeutic exercise;Balance training;Cognitive remediation;Patient/family education    PT Goals (Current goals can be found in the Care Plan section)  Acute Rehab PT Goals Patient Stated Goal: be able to have portable O2  PT Goal Formulation: With patient Time For Goal Achievement: 05/19/20 Potential to Achieve Goals: Good    Frequency Min 3X/week    AM-PAC PT "6 Clicks" Mobility  Outcome Measure Help needed turning from your back to your side while in a flat bed without using bedrails?: None Help needed moving from lying on your back to sitting on the side of a flat bed without using bedrails?: None Help needed moving to and from a bed to a chair (including a wheelchair)?: None Help needed standing up from a chair using your arms (e.g., wheelchair or bedside chair)?: None Help needed to walk in hospital room?: A Little Help needed climbing 3-5 steps with a railing? : A Little 6 Click Score: 22    End of Session Equipment Utilized During Treatment: Gait belt;Oxygen Activity Tolerance: Patient tolerated treatment well Patient left: with call bell/phone within reach;in bed Nurse Communication: Mobility status PT Visit Diagnosis: Unsteadiness on feet (R26.81);Other abnormalities of gait and mobility (R26.89);Muscle weakness (generalized) (M62.81);Difficulty in walking, not elsewhere classified (R26.2)    Time: 0100-7121 PT Time Calculation (min) (ACUTE ONLY): 24 min   Charges:   PT Evaluation $PT Eval Moderate Complexity: 1 Mod PT Treatments $Therapeutic Exercise: 8-22  mins        Calayah Guadarrama B. Migdalia Dk PT, DPT Acute Rehabilitation Services Pager [phone] Office [phone]   [place] 05/05/2020, 11:29 AM

## 2020-05-05 NOTE — Progress Notes (Signed)
Spoke with Mr. [place] ENT physician, Dr. Redmond Baseman, who recommended pulling anterior nasal packing tomorrow morning.  Afterwards encourage no nasal blowing and will place saline spray for him to use every several hours in this nare.  He will need to follow-up with ENT in the next several weeks.  Dr. Redmond Baseman and his team are available if he has any recurrent epistaxis following packing removal.  Patriciaann Clan, DO

## 2020-05-05 NOTE — Progress Notes (Signed)
Family Medicine Teaching Service Daily Progress Note Intern Pager: [phone]  Patient name: Lucas [name] Medical record number: [id number] Date of birth: [birth year] Age: 69 y.o. Gender: male  Primary Care Provider: [name], DO Consultants: Pulmonology  Code Status: FULL  Pt Overview and Major Events to Date:  Admitted 7/10 Bronchoscopy 7/12  Assessment and Plan: Lucas [name] a 69 y.o.malewho admitted for dyspnea.PMH is significant forCOPD on 3L home oxygen, CAD, NSTEMI,CABG x4, most recently 11/2019, tobacco abuse, alcohol abuse, recurrent epistaxis.  Acute on Chronic Respiratory FailureCOPD exacerbation Bronchiogenic Carcinoma  Reports improvement in his breathing since admission. Satting 97% on RA while sitting in bed during our encounter. Remains afebrile. Etiology likely COPD vs bronchiogenic carcinoma. CT Chest consistent with bronchiogenic carcinoma 1.4 x1.4x1.4 cm. Bronchoscopy scheduled today.  - bronchoscopy yesterday with several biopsies. Referral by pulmonology was placed for outpatient oncology fu -Continuous pulse ox -O2 sat goal 88-92% -s/pIV Azithromycin 500 at 250 mL/hr x1  -StartPO Azithromycin 250mg  through 7/14 -Start40 mg prednisone through 7/14 - Duoneb BID PRN wheezing, SOB -Blood cultures: NGTD  Pulmonary lung nodule Bronchiogenic Carcinoma Admission chest x-ray notable for nodular appearing opacity in the left lateral base.CT Chestwas obtained and significant for for enlarging spiculated right upper lobe pulmonary nodule consistent with bronchiogenic carcinoma.Feb 2021 CT notable 11 mm spiculated lung nodule.Patient is a 51 pack year smoker.Additionally, small loculated pleural effusion in the left anterior lateral osteophytic angle was noted. Follows with pulmonologist, Dr. Valeta [name]. Was found to have pulmonary nodules in past and was scheduled to have a bronchoscopy with biopsy, however, patient did not make appointment  as he was in the ED for epistaxis.  - Bronchoscopy with several biopsies yesterday - Referral has been placed for follow up with oncology   Alcoholic liver Cirrhosis: Chronic, stable. MeldNascore 11, <2% 90-day mortality.LFTs: AST 51 (mildly elevated), ALT 30, ALP 77, Tbili 0.8.INR 1 yesterday.Patient with known alcohol abuse. -AM CMP -CIWA as below - thiamine 100mg    - Folic acid 1 mg  CAD s/p recent CABG 11/2019: Stable. Patient with CABG earlier this year. Was previously on ASA 325 mg but was reduced to 81 mg due to recurrent nose bleeds. Follows up with Lucas [name], cardiology. - Continue ASA 81 mg - Continue rosuvastatin 20mg    Alcohol Abuse CIWAs 2,3. Drinks a Therapist, art daily. Last drink wasFriday night, 7/9. He denies any prior history of withdrawal or seizures. 0.5 Ativan injection administered in ED. -MonitorCIWAs -Thiamine, folate, multivitamin  Recurrent anterior epistaxis: Stable. Patient concerned for his intermittent epistaxis outpatient. States it started after his heart attack. Recently seen in the ED for this several times. Only on ASA 81 mg. His nose is packed 7/6, on Keflex for ppx - No epistaxis since admission.  - Consult ENT for recommendations on packing removal inpatient or follow up outpatient -Continue ASA and DVT prophylaxis, can reassess if needed -Will continue Keflex until packing is removed  Tobacco Abuse 51 pack year smoker. 1 PPD now, previously 3 PPD - Nicotine patch21 mg - Patient is interested in Chantix, will discharge with this   FEN/GI:heart healthy/carb modified diet Prophylaxis:Lovenox  Disposition: Discharge likely today  Subjective:  Patient feels much improved today. Clarifies that he only uses his 3L of home oxygen at night typically, or if he moving around a lot. He states that he likes to have the oxygen on at all times while in the hospital because he doesn't have easy access to it and has anxiety with it not  being within reach if he needs it. He is not interested in Chantix for smoking cessation as previously thought because he states the commercial scared him away. He heard that there is a potential side effect of it causing suicidal ideations. He is still wondering what he should do about his nose that is packed.   Objective: Temp:  [97.8 F (36.6 C)-98.8 F (37.1 C)] 98.1 F (36.7 C) (07/13 0415) Pulse Rate:  [73-93] 93 (07/13 0415) Resp:  [18-25] 20 (07/13 0415) BP: (112-162)/(61-79) 129/78 (07/13 0415) SpO2:  [94 %-100 %] 99 % (07/13 0415) Weight:  [59.2 kg] 59.2 kg (07/13 0419) Physical Exam: General: Awake, alert, no acute distress Cardiovascular: RRR, no murmurs Respiratory: decreased air movement throughout, faint inspiratory and expiratory wheezes Abdomen: Non-tender, non-distended in all quadrants  Extremities: 2+ radial and DP pulses   Laboratory: Recent Labs  Lab 05/03/20 0406 05/03/20 1749 05/04/20 0325  WBC 3.5* 5.0 5.1  HGB 10.9* 11.9* 11.5*  HCT 33.0* 37.2* 35.4*  PLT 142* 173 150   Recent Labs  Lab 05/03/20 0406 05/03/20 1749 05/04/20 0325  NA 134* 135 137  K 4.0 4.5 4.2  CL 97* 95* 100  CO2 29 30 25   BUN 9 9 12   CREATININE 0.64 0.79 0.70  CALCIUM 9.7 10.2 9.6  PROT 6.7 7.5  --   BILITOT 0.8 0.8  --   ALKPHOS 77 74  --   ALT 30 38  --   AST 51* 63*  --   GLUCOSE 116* 124* 91    Imaging/Diagnostic Tests: CT Chest without contrast 7/10 IMPRESSION: 1. Enlarging spiculated right upper lobe pulmonary nodule consistent with bronchogenic carcinoma. 2. Small loculated pleural effusion along the left anterolateral costophrenic angle, measuring up to 2.2 cm in greatest thickness. This is likely related to interval CABG. 3. Aortic Atherosclerosis (ICD10-I70.0) and Emphysema (ICD10-J43.9).  Chest x-ray 7/10 IMPRESSION: Stable right pleural effusion with right base atelectasis. Stable nodular appearing opacity lateral left base. Question  chronic loculated pleural effusion or rounded atelectasis. Note that this finding was not present on February 2021 study. It may be prudent to correlate with noncontrast enhanced chest CT to further assess this area. Heart upper normal in size with postoperative changes. Old rib trauma on the right.  Sharion Settler, DO 05/05/2020, 7:35 AM PGY-1, [place] Intern pager: [phone], text pages welcome

## 2020-05-06 ENCOUNTER — Encounter (HOSPITAL_COMMUNITY): Payer: Self-pay | Admitting: Pulmonary Disease

## 2020-05-06 LAB — BASIC METABOLIC PANEL
Anion gap: 10 (ref 5–15)
BUN: 12 mg/dL (ref 8–23)
CO2: 27 mmol/L (ref 22–32)
Calcium: 9.3 mg/dL (ref 8.9–10.3)
Chloride: 99 mmol/L (ref 98–111)
Creatinine, Ser: 0.69 mg/dL (ref 0.61–1.24)
GFR calc Af Amer: >60 mL/min (ref >60)
GFR calc non Af Amer: >60 mL/min (ref >60)
Glucose, Bld: 98 mg/dL (ref 70–99)
Potassium: 3.5 mmol/L (ref 3.5–5.1)
Sodium: 136 mmol/L (ref 135–145)

## 2020-05-06 LAB — CBC
HCT: 34.2 % — ABNORMAL LOW (ref 39.0–52.0)
Hemoglobin: 11.3 g/dL — ABNORMAL LOW (ref 13.0–17.0)
MCH: 30.8 pg (ref 26.0–34.0)
MCHC: 33 g/dL (ref 30.0–36.0)
MCV: 93.2 fL (ref 80.0–100.0)
Platelets: 163 10*3/uL (ref 150–400)
RBC: 3.67 MIL/uL — ABNORMAL LOW (ref 4.22–5.81)
RDW: 14.6 % (ref 11.5–15.5)
WBC: 4.8 10*3/uL (ref 4.0–10.5)
nRBC: 0 % (ref 0.0–0.2)

## 2020-05-06 LAB — SURGICAL PATHOLOGY

## 2020-05-06 LAB — CYTOLOGY - NON PAP

## 2020-05-06 MED ORDER — THIAMINE HCL 100 MG PO TABS: 100.0000 mg | ORAL_TABLET | Freq: Every day | ORAL | 0 refills | Status: DC

## 2020-05-06 NOTE — Discharge Summary (Addendum)
[facility] Discharge Summary  Patient name: Lucas [name] Medical record number: [id number] Date of birth: 07/17/1951 Age: 69 y.o. Gender: male Date of Admission: 05/02/2020  Date of Discharge: 05/06/20 Admitting Physician: Sharion Settler, DO  Primary Care Provider: [name], DO Consultants: Pulmonology  Indication for Hospitalization: Acute on chronic respiratory failure secondary to COPD exacerbation  Discharge Diagnoses/Problem List:  Acute on chronic respiratory failure secondary to COPD exacerbation Pulmonary lung nodule concerning for bronchogenic carcinoma Alcohol abuse and alcohol liver cirrhosis Epistaxis  Disposition: Home  Discharge Condition: Stable  Discharge Exam:  Temp:  [97.8 F (36.6 C)] 97.8 F (36.6 C) (07/13 2015) Pulse Rate:  [89-94] 94 (07/13 2015) Resp:  [15-16] 15 (07/13 2015) BP: (132-138)/(72-90) 132/88 (07/13 2015) SpO2:  [100 %] 100 % (07/13 2002)  Physical Exam: General: Awake, no distress Cardiovascular: RRR Respiratory: CTAB, poor air movement Abdomen: non-tender, normoactive BS Extremities: No edema HEENT: nasal packing left nare; no recurrent epistaxis after packing removal.   Brief Hospital Course:  69 y.o. male p/w COPD exacerbation and enlarged pulm nodule, also recurrent epistaxis. Below is his brief hospital course listed by problem. Refer to H&P for additional information.   Acute on Chronic Respiratory Failure 2/2 COPD Exacerbation  Requiring BiPAP on arrival. Improved with continuous albuterol and IV Solu-Medrol and weaned back to 3L [place].  Received a 5-day course of azithromycin and 40 mg prednisone.  Symptomatically improved.  No longer required BiPAP or increased oxygen per [place] at time of discharge.   Pulmonary Lung Nodule on CT  Abnormal CXR  Patient is a 51-pack-year smoker.  Previous history of a pulmonary nodule on CT 11/2019.  Was previously scheduled for a bronchoscopy but was unable to  make the appointment due to epistaxis.  Repeat CT showed enlarging spiculated right upper lobe pulmonary nodule consistent with bronchogenic carcinoma.  Bronchoscopy performed 7/13 and multiple biopsies obtained.  Cytology of the right upper lung mass significant for malignant cells consistent with squamous cell carcinoma. Referral placed for oncology outpatient.  Alcohol Abuse and Alcoholic Liver Cirrhosis 6 pack per day daily drinker. Received 0.5 Ativan injection in ED. CIWA scores stable at 2 and 3 throughout admission. MeldNa score 11 with a <2% 90-day mortality. Thiamine and folic acid administered. Patient remained stable throughout [place].  Epistaxis Left anterior nare packed on 7/6. Placed on Keflex for prophylaxis. Packing removed 7/14. No recurrance of epistaxis. Patient to follow up with ENT in 3-4 weeks. Instructed to use saline nasal spray and to not blow his nose. Antibiotic discontinued at discharge.   Other chronic conditions stable.  Issues for Follow Up:  1. Lung biopsies consistent with squamous cell carcinoma. Will need to follow with oncology. 2. Consider discussing methods for smoking cessation.  Patient initially interested but later changed his mind during admission.  Would be worth discussing again. 3. Several visits to the ED for epistaxis.  Important to follow-up with ENT for further evaluation.  Significant Procedures: Bronchoscopy 7/12  Significant Labs and Imaging:  Recent Labs  Lab 05/04/20 0325 05/05/20 0934 05/06/20 0546  WBC 5.1 4.5 4.8  HGB 11.5* 11.5* 11.3*  HCT 35.4* 35.4* 34.2*  PLT 150 159 163   Recent Labs  Lab 05/03/20 0406 05/03/20 0406 05/03/20 1749 05/03/20 1749 05/04/20 0325 05/04/20 0325 05/05/20 0934 05/06/20 0546  NA 134*  --  135  --  137  --  136 136  K 4.0   < > 4.5   < > 4.2   < >  3.8 3.5  CL 97*  --  95*  --  100  --  98 99  CO2 29  --  30  --  25  --  29 27  GLUCOSE 116*  --  124*  --  91  --  104* 98  BUN 9  --  9   --  12  --  9 12  CREATININE 0.64  --  0.79  --  0.70  --  0.72 0.69  CALCIUM 9.7  --  10.2  --  9.6  --  9.8 9.3  ALKPHOS 77  --  74  --   --   --   --   --   AST 51*  --  63*  --   --   --   --   --   ALT 30  --  38  --   --   --   --   --   ALBUMIN 3.2*  --  3.7  --   --   --   --   --    < > = values in this interval not displayed.   PORTABLE CHEST 1 VIEW COMPARISON:  January 31, 2020 chest radiograph; CT angiogram chest December 12, 2019 IMPRESSION: Stable right pleural effusion with right base atelectasis. Stable nodular appearing opacity lateral left base. Question chronic loculated pleural effusion or rounded atelectasis. Note that this finding was not present on February 2021 study. It may be prudent to correlate with noncontrast enhanced chest CT to further assess this Area. Heart upper normal in size with postoperative changes. Old rib trauma on the right.  CT CHEST WITHOUT CONTRAST TECHNIQUE: Multidetector CT imaging of the chest was performed following the standard protocol without IV contrast. COMPARISON:  12/12/2019, 12/08/2017 IMPRESSION: 1. Enlarging spiculated right upper lobe pulmonary nodule consistent with bronchogenic carcinoma. 2. Small loculated pleural effusion along the left anterolateral costophrenic angle, measuring up to 2.2 cm in greatest thickness. This is likely related to interval CABG. 3. Aortic Atherosclerosis (ICD10-I70.0) and Emphysema (ICD10-J43.9).  PORTABLE CHEST 1 VIEW COMPARISON:  05/02/2020 IMPRESSION: 1. No complication after bronchoscopy. Fiduciary markers identified in the right upper lobe.  FINAL MICROSCOPIC DIAGNOSIS:  A. LUNG, RIGHT UPPER LOBE MASS, BRUSHING:  - Malignant cells consistent with squamous cell carcinoma  B. LUNG, RIGHT UPPE LOBE MASS, FINE NEEDLE ASPIRATION:  - Malignant cells consistent with squamous cell carcinoma   FINAL MICROSCOPIC DIAGNOSIS:  A. LUNG, RIGHT UPPER LOBE, BIOPSY:  - Squamous cell  carcinoma.  COMMENT:  Immunohistochemistry is positive for cytokeratin 5/6 and p40. TTF-1 is  negative. Dr. Gari Crown has reviewed the case. There is likely sufficient  material for additional studies if requested (Block A1).    Results/Tests Pending at Time of Discharge: None  Discharge Medications:  Allergies as of 05/06/2020      Reactions   Ace Inhibitors Swelling   Ampicillin Nausea And Vomiting   Penicillins Nausea And Vomiting   Did it involve swelling of the face/tongue/throat, SOB, or low BP? N Did it involve sudden or severe rash/hives, skin peeling, or any reaction on the inside of your mouth or nose? N Did you need to seek medical attention at a hospital or doctor's office? N When did it last happen?Teenager If all above answers are "NO", may proceed with cephalosporin use.   Codeine Nausea And Vomiting   Lisinopril Swelling   REACTION: angioedema      Medication List    STOP taking these  medications   cephALEXin 500 MG capsule Commonly known as: KEFLEX   guaiFENesin 600 MG 12 hr tablet Commonly known as: MUCINEX   ibuprofen 200 MG tablet Commonly known as: ADVIL   montelukast 10 MG tablet Commonly known as: SINGULAIR   pantoprazole 40 MG tablet Commonly known as: PROTONIX     TAKE these medications   albuterol 108 (90 Base) MCG/ACT inhaler Commonly known as: ProAir HFA INHALE 2 PUFFS BY MOUTH EVERY 4 HOURS AS NEEDED FOR WHEEZE OR FOR SHORTNESS OF BREATH What changed:   how much to take  how to take this  when to take this  reasons to take this  additional instructions   Anoro Ellipta 62.5-25 MCG/INH Aepb Generic drug: umeclidinium-vilanterol INHALE 1 PUFF INTO THE LUNGS DAILY What changed: See the new instructions.   aspirin EC 81 MG tablet Take 81 mg by mouth daily.   bisacodyl 5 MG EC tablet Generic drug: bisacodyl Take 15 mg by mouth daily as needed for moderate constipation.   fluticasone 50 MCG/ACT nasal spray Commonly known  as: FLONASE SPRAY 2 SPRAYS INTO EACH NOSTRIL EVERY DAY What changed: See the new instructions.   losartan 25 MG tablet Commonly known as: COZAAR Take 0.5 tablets (12.5 mg total) by mouth daily.   metoprolol tartrate 25 MG tablet Commonly known as: LOPRESSOR TAKE 1.5 TABLETS (37.5 MG TOTAL) BY MOUTH 2 (TWO) TIMES DAILY.   nitroGLYCERIN 0.4 MG SL tablet Commonly known as: NITROSTAT PLACE 1 TABLET (0.4 MG TOTAL) UNDER THE TONGUE EVERY 5 (FIVE) MINUTES AS NEEDED FOR CHEST PAIN.   OXYGEN Inhale 3-4 L into the lungs See admin instructions. Continuous at night, as needed during the day   oxymetazoline 0.05 % nasal spray Commonly known as: AFRIN Place 1 spray into both nostrils 2 (two) times daily as needed for congestion (nose bleeds).   rosuvastatin 40 MG tablet Commonly known as: Crestor Take 1 tablet (40 mg total) by mouth daily.   rosuvastatin 20 MG tablet Commonly known as: CRESTOR Take 20 mg by mouth at bedtime.   thiamine 100 MG tablet Take 1 tablet (100 mg total) by mouth daily.            Durable Medical Equipment  (From admission, onward)         Start     Ordered   05/06/20 1241  DME Oxygen  Once       Comments: Already has oxygen at home. Qualifies for bedtime 3L and 3L with ambulation.  Question Answer Comment  Length of Need 6 Months   Mode or (Route) Nasal cannula   Liters per Minute 3   Frequency Continuous (stationary and portable oxygen unit needed)   Oxygen delivery system Gas      05/06/20 1241          Discharge Instructions: Please refer to Patient Instructions section of EMR for full details.  Patient was counseled important signs and symptoms that should prompt return to medical care, changes in medications, dietary instructions, activity restrictions, and follow up appointments.   Follow-Up Appointments:  Follow-up Information    Icard, Bradley L, DO.   Specialty: Pulmonary Disease Contact information: [address]  [place] [address] [phone]        Melida Quitter, MD. Schedule an appointment as soon as possible for a visit in 2 week(s).   Specialty: Otolaryngology Why: Make a follow up appt in a few weeks. Contact information: [address] [employer] [address]  [place] [address] [phone]        [name], DO. Go on 05/15/2020.   Specialty: Family Medicine Why: At 2:45 pm for hospital follow up.  Contact information: [address] [place] [address] [phone]        Care, [facility] Follow up.   Specialty: Home Health Services Why: Registered Nurse, Physical Therapy, Occupational Therapy.  Contact information: [address] [place] [address] [phone]        [facility], [facility] Follow up.   Why: [employer]  Contact information: [address] [place] [address] [facility], [place], DO 05/06/2020, 2:05 PM PGY-1, [place], DO 05/06/2020, 3:20 PM PGY-2, [facility]

## 2020-05-06 NOTE — Progress Notes (Signed)
Family Medicine Teaching Service Daily Progress Note Intern Pager: [phone]  Patient name: Lucas [name] Medical record number: [id number] Date of birth: [birth year] Age: 69 y.o. Gender: male  Primary Care Provider: [name], DO Consultants: ENT, Pulmonology  Code Status: FULL  Pt Overview and Major Events to Date:  Admitted 7/10 Bronchoscopy 7/12  Assessment and Plan: Lucas [name] a 69 y.o.malewho admitted for dyspnea.PMH is significant forCOPD on 3L home oxygen, CAD, NSTEMI,CABG x4, most recently 11/2019, tobacco abuse, alcohol abuse, recurrent epistaxis.  Acute on Chronic Respiratory FailureCOPD exacerbation Bronchiogenic Carcinoma  Reports improvement in his breathing since admission.Satting 98% on RA in room.Remains afebrile. Etiology likely COPD vs bronchiogenic carcinoma. CT Chest consistent with bronchiogenic carcinoma 1.4 x1.4x1.4 cm. - Referral by pulmonology was placed for outpatient oncology follow up -Continuous pulse ox -O2 sat goal 88-92% -Today is last day of Azithromycin and Prednisone - DuonebBID PRN wheezing, SOB -Blood cultures: NGTD  Pulmonary lung nodule Bronchiogenic Carcinoma Admission chest x-ray notable for nodular appearing opacity in the left lateral base.CT Chestwas obtained and significant for enlarging spiculated right upper lobe pulmonary nodule consistent with bronchiogenic carcinoma.Feb 2021 CT notable 70 mm spiculated lung nodule.Patient is a 70 pack year smoker.Additionally, small loculated pleural effusion in the left anterior lateral osteophytic angle was noted. Follows with pulmonologist, Dr. Valeta Harms. Was found to have pulmonary nodules in past and was scheduled to have a bronchoscopy with biopsy, however, patient did not make appointment as he was in the ED for epistaxis. - Bronchoscopy with several biopsies on 7/12 - Referral has been placed for follow up with oncology   Alcoholic liver Cirrhosis:  Chronic, stable. MeldNascore 11, <2% 90-day mortality.LFTs: AST 51 (mildly elevated), ALT 30, ALP 77, Tbili 0.8.INR 1 yesterday.Patient with known alcohol abuse. -CIWA - thiamine 100mg   - Folic acid 1 mg  CAD s/p recent CABG 11/2019: Stable. Patient with CABG earlier this year. Was previously on ASA 325 mg but was reduced to 81 mg due to recurrent nose bleeds. Follows up with Dr. Martinique, cardiology. - Continue ASA 81 mg - Continue rosuvastatin 20mg    Alcohol Abuse CIWAs 2,3. Drinks a Therapist, art daily. Last drink wasFriday night, 7/9. He denies any prior history of withdrawal or seizures. 0.5 Ativan injection administered in ED. -Thiamine, folate, multivitamin  Recurrent anterior epistaxis: Stable. Patient concerned for his intermittent epistaxis outpatient. States it started after his heart attack. Recently seen in the ED for this several times. Only on ASA 81 mg.His nose is packed 7/6, on Keflex for ppx - No epistaxis since admission.  - Saline spray for him to use every several hours in his nare. Follow up with ENT in next 3-4 weeks -Discontinue Keflex at discharge  Tobacco Abuse 70 pack year smoker.1 PPDnow, previously 3 PPD - Nicotine patch21 mg -Patient is not interested in smoking cessation at this time. Will try to cut back a little.  FEN/GI:heart healthy/carb modified diet Prophylaxis:Lovenox  Disposition: Discharge home today   Subjective:  Patient feels fine today. No SOB. He is concerned that his nose will start to bleed again once packing is removed.   Objective: Temp:  [97.8 F (36.6 C)] 97.8 F (36.6 C) (07/13 2015) Pulse Rate:  [89-94] 94 (07/13 2015) Resp:  [15-16] 15 (07/13 2015) BP: (132-138)/(72-90) 132/88 (07/13 2015) SpO2:  [100 %] 100 % (07/13 2002) Physical Exam: General: Awake, no distress Cardiovascular: RRR Respiratory: CTAB, poor air movement Abdomen: non-tender, normoactive BS Extremities: No edema HEENT: nasal packing  left nare; no  recurrent epistaxis after packing removal.   Laboratory: Recent Labs  Lab 05/03/20 1749 05/04/20 0325 05/05/20 0934  WBC 5.0 5.1 4.5  HGB 11.9* 11.5* 11.5*  HCT 37.2* 35.4* 35.4*  PLT 173 150 159   Recent Labs  Lab 05/03/20 0406 05/03/20 0406 05/03/20 1749 05/04/20 0325 05/05/20 0934  NA 134*   < > 135 137 136  K 4.0   < > 4.5 4.2 3.8  CL 97*   < > 95* 100 98  CO2 29   < > 30 25 29   BUN 9   < > 9 12 9   CREATININE 0.64   < > 0.79 0.70 0.72  CALCIUM 9.7   < > 10.2 9.6 9.8  PROT 6.7  --  7.5  --   --   BILITOT 0.8  --  0.8  --   --   ALKPHOS 77  --  74  --   --   ALT 30  --  38  --   --   AST 51*  --  63*  --   --   GLUCOSE 116*   < > 124* 91 104*   < > = values in this interval not displayed.    Imaging/Diagnostic Tests: CT Chest without contrast 7/10 IMPRESSION: 1. Enlarging spiculated right upper lobe pulmonary nodule consistent with bronchogenic carcinoma. 2. Small loculated pleural effusion along the left anterolateral costophrenic angle, measuring up to 2.2 cm in greatest thickness. This is likely related to interval CABG. 3. Aortic Atherosclerosis (ICD10-I70.0) and Emphysema (ICD10-J43.9).  Chest x-ray 7/10 IMPRESSION: Stable right pleural effusion with right base atelectasis. Stable nodular appearing opacity lateral left base. Question chronic loculated pleural effusion or rounded atelectasis. Note that this finding was not present on February 2021 study. It may be prudent to correlate with noncontrast enhanced chest CT to further assess this area. Heart upper normal in size with postoperative changes. Old rib trauma on the right.  Sharion Settler, DO 05/06/2020, 6:12 AM PGY-1, [place] Intern pager: [phone], text pages welcome

## 2020-05-06 NOTE — Discharge Instructions (Signed)
Dear [name],   Thank you so much for allowing [place] to be part of your care!  You were admitted to [facility] for a COPD exacerbation. Fortunately, you significantly improved with steroids and breathing treatments. You also had a bronchoscopy where they took a biopsy of the lung nodule, those results have not returned yet.    POST-HOSPITAL & CARE INSTRUCTIONS 1. Follow up with ENT and Dr. Valeta Harms.  2. We HIGHLY encourage to quit smoking, but understand you are not interested in trying medication on discharge. Please discuss chantix or Welbutrin at follow up visits with your PCP and [facility] provider.  3. Please let PCP/Specialists know of any changes that were made.  4. Please see medications section of this packet for any medication changes.   DOCTOR'S APPOINTMENT & FOLLOW UP CARE INSTRUCTIONS  Future Appointments  Date Time Provider [place]  05/20/2020  4:20 PM Martinique, Peter M, MD [facility]  05/26/2020  2:00 PM Martyn Ehrich, NP [facility] None    RETURN PRECAUTIONS: If any difficulty breathing, chest pain, or severe nose bleeding.   Take care and be well!  [facility]  [place], [place] [address] [phone]

## 2020-05-06 NOTE — Progress Notes (Signed)
...   Taking over patient for Clarene Critchley RN and discharging patient for her as well.  Patient being discharged with oxygen and home health.   Patient was discharged home by MD order; discharged instructions  review and give to patient with care notes and prescriptions; IV DIC; skin intact; patient will be escorted to the car by nurse tech via wheelchair.

## 2020-05-06 NOTE — Care Management Important Message (Signed)
Important Message  Patient Details  Name: JUSTON GOHEEN MRN: [id number] Date of Birth: 22-Mar-1951   Medicare Important Message Given:  Yes     Shelda Altes 05/06/2020, 1:37 PM

## 2020-05-06 NOTE — Plan of Care (Signed)

## 2020-05-06 NOTE — Plan of Care (Signed)
  Problem: Education: Goal: Knowledge of General Education information will improve Description: Including pain rating scale, medication(s)/side effects and non-pharmacologic comfort measures Outcome: Progressing   Problem: Health Behavior/Discharge Planning: Goal: Ability to manage health-related needs will improve Outcome: Progressing   Problem: Clinical Measurements: Goal: Ability to maintain clinical measurements within normal limits will improve Outcome: Adequate for Discharge   Problem: Clinical Measurements: Goal: Will remain free from infection Outcome: Adequate for Discharge   Problem: Clinical Measurements: Goal: Diagnostic test results will improve Outcome: Adequate for Discharge   Problem: Clinical Measurements: Goal: Respiratory complications will improve Outcome: Adequate for Discharge   Problem: Clinical Measurements: Goal: Cardiovascular complication will be avoided Outcome: Adequate for Discharge   Problem: Activity: Goal: Risk for activity intolerance will decrease Outcome: Adequate for Discharge   Problem: Nutrition: Goal: Adequate nutrition will be maintained Outcome: Adequate for Discharge   Problem: Coping: Goal: Level of anxiety will decrease Outcome: Adequate for Discharge   Problem: Elimination: Goal: Will not experience complications related to bowel motility Outcome: Adequate for Discharge   Problem: Elimination: Goal: Will not experience complications related to urinary retention Outcome: Adequate for Discharge   Problem: Pain Managment: Goal: General experience of comfort will improve Outcome: Adequate for Discharge   Problem: Safety: Goal: Ability to remain free from injury will improve Outcome: Adequate for Discharge   Problem: Skin Integrity: Goal: Risk for impaired skin integrity will decrease Outcome: Adequate for Discharge   Problem: Safety: Goal: Ability to remain free from injury will improve Outcome: Adequate for  Discharge   Problem: Skin Integrity: Goal: Risk for impaired skin integrity will decrease Outcome: Adequate for Discharge

## 2020-05-06 NOTE — TOC Initial Note (Addendum)
Transition of Care [facility]) - Initial/Assessment Note    Patient Details  Name: Lucas [name] MRN: [id number] Date of Birth: 1951/10/22  Transition of Care [facility]) CM/SW Contact:    Bethena Roys, RN Phone Number: 05/06/2020, 12:56 PM  Clinical Narrative: Patient presented for shortness of breath. Prior to arrival patient was from home with spouse. Patient has oxygen via [employer]- patient states his portable tank was not charging. Case Manager spoke with patient and his wife regarding home health services- Medicare.gov list provided to patient. Family chose [facility] and referral made and accepted. Start of care to begin within 24-48 hours post transition home. Case Manager has reached out [employer] to discuss oxygen needs. Patient is calling family for transportation home. Case Manager will continue to follow.   1322 05-06-20 [employer] has spoken to patient and they will assist in the community with the new portable tank. Patient has a concentrator in the home that goes to 5 Liters. Family to bring portable oxygen tank to hospital for transport. No further needs at this time.                Expected Discharge Plan: [place] Barriers to Discharge: No Barriers Identified   Patient Goals and CMS Choice Patient states their goals for this hospitalization and ongoing recovery are:: to return home with home health services CMS Medicare.gov Compare Post Acute Care list provided to:: Patient Choice offered to / list presented to : Patient  Expected Discharge Plan and Services Expected Discharge Plan: [place] In-house Referral: NA Discharge Planning Services: CM Consult Post Acute Care Choice: NA Living arrangements for the past 2 months: Single Family Home Expected Discharge Date: 05/06/20               DME Arranged: Oxygen DME Agency: [employer] Date DME Agency Contacted: 05/06/20 Time DME Agency Contacted: [id number] Representative spoke with at DME Agency:  Thedore Mins HH Arranged: RN, PT, Disease Management, OT [place] Agency: [place] Date [place]: 05/06/20 Time [place]: 27 Representative spoke with at [place]: Tommi Rumps  Prior Living Arrangements/Services Living arrangements for the past 2 months: [place] Lives with:: Spouse Patient language and need for interpreter reviewed:: Yes Do you feel safe going back to the place where you live?: Yes      Need for Family Participation in Patient Care: Yes (Comment) Care giver support system in place?: Yes (comment)   Criminal Activity/Legal Involvement Pertinent to Current Situation/Hospitalization: No - Comment as needed  Activities of Daily Living Home Assistive Devices/Equipment: Oxygen ADL Screening (condition at time of admission) Patient's cognitive ability adequate to safely complete daily activities?: Yes Is the patient deaf or have difficulty hearing?: Yes Does the patient have difficulty seeing, even when wearing glasses/contacts?: No Does the patient have difficulty concentrating, remembering, or making decisions?: No Patient able to express need for assistance with ADLs?: No Does the patient have difficulty dressing or bathing?: No Independently performs ADLs?: Yes (appropriate for developmental age) Does the patient have difficulty walking or climbing stairs?: Yes Weakness of Legs: Both Weakness of Arms/Hands: None  Permission Sought/Granted Permission sought to share information with : Family Supports, Customer service manager, Case Optician, dispensing granted to share information with : Yes, Verbal Permission Granted     Permission granted to share info w AGENCY: Alvis Lemmings        Emotional Assessment Appearance:: Appears stated age Attitude/Demeanor/Rapport: Engaged Affect (typically observed): Appropriate Orientation: :  Oriented to Situation, Oriented to  Time, Oriented to Place, Oriented to Self Alcohol / Substance Use: Not  Applicable Psych Involvement: No (comment)  Admission diagnosis:  Tobacco abuse [Z72.0] COPD exacerbation (HCC) [J44.1] Pleural effusion, right [J90] Acute respiratory failure with hypoxia (HCC) [J96.01] Acute on chronic respiratory failure with hypoxia (HCC) [J96.21] Patient Active Problem List   Diagnosis Date Noted  . Lung mass 05/04/2020  . Acute on chronic respiratory failure with hypoxia ([place]) 05/02/2020  . Pulmonary nodule 02/10/2020  . Pleural effusion, right   . S/P CABG x 4 12/16/2019  . Coronary artery disease 12/16/2019  . Unstable angina ([place]) 12/12/2019  . NSTEMI (non-ST elevated myocardial infarction) ([place]) 12/12/2019  . CAD (coronary artery disease) 07/11/2018  . H. pylori infection 07/03/2018  . Chronic stable angina ([place]) 07/03/2018  . Duodenal ulcer due to Helicobacter pylori   . History of Right clavicular fracture 06/25/2018  . Bilateral AVN of femurs ([place]) 06/25/2018  . Chronic cough 06/25/2018  . Mallory-Weiss tear   . Gastritis and gastroduodenitis   . Cirrhosis of liver ([place])   . Tubular adenoma of colon 01/24/2017  . COPD exacerbation ([place]) 02/13/2013  . Ganglion cyst 01/03/2012  . PERIPHERAL NEUROPATHY 07/15/2010  . Alcohol use disorder, severe, dependence ([place]) 05/14/2009  . DEFORMITY, FOOT NEC, CONGENITAL 08/22/2007  . ALLERGIC RHINITIS, SEASONAL 05/03/2007  . Hyperlipidemia 12/21/2006  . Tobacco abuse 12/21/2006  . COPD with chronic bronchitis ([place]) 12/21/2006   PCP:  [name], DO Pharmacy:   [id number] [address], [place] [address] Phone: [phone] Fax: [phone]  Readmission Risk Interventions Readmission Risk Prevention Plan 12/23/2019  Transportation Screening Complete  PCP or Specialist Appt within 5-7 Days Complete  Home Care Screening Complete  Medication Review (RN CM) Complete  Some recent data might be hidden

## 2020-05-07 LAB — CULTURE, BLOOD (ROUTINE X 2)
Culture: NO GROWTH
Culture: NO GROWTH
Special Requests: ADEQUATE

## 2020-05-08 DIAGNOSIS — J441 Chronic obstructive pulmonary disease with (acute) exacerbation: Secondary | ICD-10-CM | POA: Diagnosis not present

## 2020-05-08 DIAGNOSIS — M160 Bilateral primary osteoarthritis of hip: Secondary | ICD-10-CM | POA: Diagnosis not present

## 2020-05-08 DIAGNOSIS — M170 Bilateral primary osteoarthritis of knee: Secondary | ICD-10-CM | POA: Diagnosis not present

## 2020-05-08 DIAGNOSIS — G629 Polyneuropathy, unspecified: Secondary | ICD-10-CM | POA: Diagnosis not present

## 2020-05-08 DIAGNOSIS — J9621 Acute and chronic respiratory failure with hypoxia: Secondary | ICD-10-CM | POA: Diagnosis not present

## 2020-05-11 ENCOUNTER — Telehealth: Payer: Self-pay

## 2020-05-11 DIAGNOSIS — M170 Bilateral primary osteoarthritis of knee: Secondary | ICD-10-CM | POA: Diagnosis not present

## 2020-05-11 DIAGNOSIS — M160 Bilateral primary osteoarthritis of hip: Secondary | ICD-10-CM | POA: Diagnosis not present

## 2020-05-11 DIAGNOSIS — J9621 Acute and chronic respiratory failure with hypoxia: Secondary | ICD-10-CM | POA: Diagnosis not present

## 2020-05-11 DIAGNOSIS — J441 Chronic obstructive pulmonary disease with (acute) exacerbation: Secondary | ICD-10-CM | POA: Diagnosis not present

## 2020-05-11 DIAGNOSIS — G629 Polyneuropathy, unspecified: Secondary | ICD-10-CM | POA: Diagnosis not present

## 2020-05-11 NOTE — Telephone Encounter (Signed)
Angel from [place] calling for skilled nursing verbal orders as follows:  1 time(s) weekly for 8 week(s).  RN also reporting elevated BP from today's visit. BP 182/80, all other vital signs WNL. Patient is currently asymptomatic. Patient has scheduled follow up appointment on Friday 7/23. Strict ED precautions given to patient and advised patient to monitor BP once daily and report additional elevated readings.   FYI to PCP   Talbot Grumbling, RN

## 2020-05-14 ENCOUNTER — Other Ambulatory Visit: Payer: Self-pay | Admitting: *Deleted

## 2020-05-14 DIAGNOSIS — G629 Polyneuropathy, unspecified: Secondary | ICD-10-CM | POA: Diagnosis not present

## 2020-05-14 DIAGNOSIS — J9621 Acute and chronic respiratory failure with hypoxia: Secondary | ICD-10-CM | POA: Diagnosis not present

## 2020-05-14 DIAGNOSIS — M160 Bilateral primary osteoarthritis of hip: Secondary | ICD-10-CM | POA: Diagnosis not present

## 2020-05-14 DIAGNOSIS — J441 Chronic obstructive pulmonary disease with (acute) exacerbation: Secondary | ICD-10-CM | POA: Diagnosis not present

## 2020-05-14 DIAGNOSIS — M170 Bilateral primary osteoarthritis of knee: Secondary | ICD-10-CM | POA: Diagnosis not present

## 2020-05-14 NOTE — Progress Notes (Signed)
The proposed treatment discussed in cancer conference 05/14/20 is for discussion purpose only and is not a binding recommendation.  The patient was not physically examined nor present for their treatment options.  Therefore, final treatment plans cannot be decided.

## 2020-05-15 ENCOUNTER — Ambulatory Visit (INDEPENDENT_AMBULATORY_CARE_PROVIDER_SITE_OTHER): Payer: Medicare Other | Admitting: Family Medicine

## 2020-05-15 ENCOUNTER — Encounter: Payer: Self-pay | Admitting: Family Medicine

## 2020-05-15 ENCOUNTER — Other Ambulatory Visit: Payer: Self-pay

## 2020-05-15 DIAGNOSIS — I200 Unstable angina: Secondary | ICD-10-CM | POA: Diagnosis not present

## 2020-05-15 DIAGNOSIS — R918 Other nonspecific abnormal finding of lung field: Secondary | ICD-10-CM | POA: Diagnosis not present

## 2020-05-15 DIAGNOSIS — R040 Epistaxis: Secondary | ICD-10-CM | POA: Diagnosis not present

## 2020-05-15 NOTE — Progress Notes (Signed)
   SUBJECTIVE:   CHIEF COMPLAINT / HPI:   CC: Hospital follow-up    Lucas [name] is a 69 y.o. male here for hospital follow-up.  Hospital follow-up Patient admitted on 05/02/2020 for acute on chronic respiratory failure.  He was discharged on 05/06/2020.  Found to have squamous cell lung cancer.  Patient states he continues to smoke but has decreased some since being discharged.  Has follow-up scheduled with cardiology, pulmonology, oncology and ENT.  Had chest pain this morning that resolved with 1 tablet of nitroglycerin.  Endorses dyspnea on exertion, fatigue, shortness of breath, exertional chest pain and paroxysmal nighttime dyspnea.  Denies chest pain currently and has no shortness of breath when at rest.  Uses 3 L nasal cannula during the day and turns it up to 4 L at night.  Patient states he has had a "good days and bad days since his hospitalization.    PERTINENT  PMH / PSH: Coronary artery disease, CABG x4, NSTEMI, unstable angina, COPD, squamous cell lung cancer, tobacco use disorder, history of alcohol abuse, reviewed and updated as appropriate   OBJECTIVE:   BP 123/70   Pulse 90   Ht 5\' 11"  (1.803 m)   Wt 126 lb 12.8 oz (57.5 kg)   SpO2 98%   BMI 17.69 kg/m    GEN: Laughs and jokes during exam, in no acute distress  CV: regular rate and rhythm, no murmurs appreciated  RESP: no increased work of breathing, not wearing nasal cannula at rest, bibasilar rales present, no wheezing, no rhonchi  MSK: no LE edema, or cyanosis noted SKIN: warm, dry, well-perfused NEURO: grossly normal, moves all extremities appropriately PSYCH: Normal affect, appropriate speech and behavior    ASSESSMENT/PLAN:   Unstable angina ([place]) Has follow-up with cardiology 05/20/2020.  ED precautions given.  Continue current medications.  Lung mass Diagnosed with squamous cell cancer in hospital.  Has pulmonology and oncology follow-up on 05/26/2020.  Encourage patient to use oxygen throughout the day  and at night.  Encouraged smoking cessation.  Satting well on 3 L upon arrival.  Continue current medications.  Epistaxis Has follow-up with Dr. Redmond Baseman on 06/19/2020.  Patient believes this is related to his aspirin.  Has had no nosebleeds since discharge.  Advised patient to use normal saline spray to help moisten the nose.  Continue using Flonase.    [name], DO PGY-2, [facility] 05/15/2020

## 2020-05-15 NOTE — Assessment & Plan Note (Signed)
Has follow-up with Dr. Redmond Baseman on 06/19/2020.  Patient believes this is related to his aspirin.  Has had no nosebleeds since discharge.  Advised patient to use normal saline spray to help moisten the nose.  Continue using Flonase.

## 2020-05-15 NOTE — Assessment & Plan Note (Addendum)
Diagnosed with squamous cell cancer in hospital.  Has pulmonology and oncology follow-up on 05/26/2020.  Encourage patient to use oxygen throughout the day and at night.  Encouraged smoking cessation.  Satting well on 3 L upon arrival.  Continue current medications.

## 2020-05-15 NOTE — Patient Instructions (Signed)
It was great seeing you today!  Please check-out at the front desk before leaving the clinic. I'd like to see you back in 3 months but if you need to be seen earlier than that for any new issues we're happy to fit you in, just give [place] a call!  Visit Remembers:  - Continue to work on your healthy eating habits and incorporating exercise into your daily life.  - To Do: use saline nasal spray to help keep nose moist while using oxygen  - Continue to use your oxygen at home during the day and at night    Call us if or seek emergency care if you having increasing shortness of breath with chest pain   Please bring all of your medications with you to each visit.    If you haven't already, sign up for My Chart to have easy access to your labs results, and communication with your primary care physician.  Feel free to call with any questions or concerns at any time, at [phone].   Take care,  Dr. Rushie [facility]

## 2020-05-15 NOTE — Assessment & Plan Note (Signed)
Has follow-up with cardiology 05/20/2020.  ED precautions given.  Continue current medications.

## 2020-05-16 NOTE — Progress Notes (Signed)
Cardiology Office Note:    Date:  05/20/2020   ID:  Lucas [name], DOB 1950/11/09, MRN [id number]  PCP:  [name], DO  Cardiologist:  Nakai Pollio Martinique, MD  Electrophysiologist:  None   Referring MD: [name], DO   Chief Complaint  Patient presents with  . Coronary Artery Disease    History of Present Illness:    Lucas [name] is a 69 y.o. male with a hx of HTN, HLD, EtOH abuse, tobacco abuse, COPD and CAD.  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  He later presented to [facility] on 10/24/2017 with chest pain.  Cardiac catheterization at the time revealed 35% proximal RCA, 85% and 50% sequential lesion in mid RCA, 70% ostial left circumflex, 65% left main/proximal LAD, occluded D2.  The LAD and left circumflex lesion was not amenable to PCI and were not critical.  The mid RCA lesion was more severe however would require atherectomy and stenting.  Given ongoing alcohol abuse, Mallory-Weiss tear and medication noncompliance, it was decided to manage medically.  Patient had COVID-19 infection in October 2020.    More recently, patient was admitted to the hospital on 12/12/2019 with chest pain. He was ruled in for NSTEMI by enzymes.  Cardiac catheterization showed severe calcified three-vessel disease with 80% distal left main to proximal LAD, 100% occluded ostial D2, 85% ostial left circumflex lesion, 60% proximal RCA, 99% mid RCA, 95% proximal to mid left circumflex lesion, 60% ostial D1, EF 50 to 55%.  Echocardiogram obtained on the same day showed EF 50 to 55%, mild MR.  Patient eventually underwent CABG x4 by Dr. Kipp Brood on 12/16/2019 with LIMA-LAD, SVG-PDA, SVG-D1, and SVG-OM2.  Hemoglobin A1c prior to the surgery was 6.1 which placed the patient in the prediabetes range.  Postprocedure, Lopressor was increased to 37.5 mg twice daily.  Noted during the hospitalization, patient had CT angiogram of the chest that  demonstrated new 11 mm spiculated nodule in the right upper lobe.  Given the history of smoking, this likely represent primary bronchogenic carcinoma.   Patient has since had several ED evaluations for epistaxis. He was admitted in July with COPD exacerbation treated with inhalers, steroids and antibiotics. CT showed enlargement of RU lobe spiculated mass. Bronchoscopy was done with path positive for squamous cell CA. Not felt to be a surgical candidate due to ongoing smoking. Referred to oncology for SBRT.   On follow up today he is doing ok. Gets tight in his chest at times.rare chest pain. No cough. He is still smoking 10 cigs/day. On home oxygen but doesn't use when he is smoking.     Past Medical History:  Diagnosis Date  . Acute blood loss anemia 06/21/2018  . Acute upper GI bleed 06/21/2018  . Allergy   . Bilateral AVN of femurs ([place]) 06/25/2018   See 11/2017 Chest CT report  . COPD 12/21/2006  . Environmental allergies   . History of femur fracture 06/25/2018   11/2017 CT AP showed Remote nonunited right femur greater trochanter fracture  . History of Fracture of right tibia 06/26/2018  . History of multiple right thoacic rib fractures 06/25/2018  . History of Right clavicular fracture 06/25/2018  . HYPERLIPIDEMIA 12/21/2006  . HYPERTENSION, BENIGN SYSTEMIC 12/21/2006  . Mallory-Weiss tear   . TOBACCO DEPENDENCE 12/21/2006    Past Surgical History:  Procedure Laterality Date  . ANKLE SURGERY     left ankle  .  APPLICATION OF WOUND VAC  01/06/2012   Procedure: APPLICATION OF WOUND VAC;  Surgeon: Meredith Pel, MD;  Location: [facility];  Service: Orthopedics;  Laterality: Left;  . APPLICATION OF WOUND VAC Right 10/17/2015   Procedure: APPLICATION OF WOUND VAC;  Surgeon: Leandrew Koyanagi, MD;  Location: [place];  Service: Orthopedics;  Laterality: Right;  . BIOPSY  06/22/2018   Procedure: BIOPSY;  Surgeon: [id number] v, DO;  Location: [facility];  Service: Gastroenterology;;  . BRONCHIAL  BIOPSY  05/04/2020   Procedure: BRONCHIAL BIOPSIES;  Surgeon: Garner Nash, DO;  Location: [place];  Service: Pulmonary;;  . BRONCHIAL BRUSHINGS  05/04/2020   Procedure: BRONCHIAL BRUSHINGS;  Surgeon: Garner Nash, DO;  Location: [place] [facility];  Service: Pulmonary;;  . BRONCHIAL NEEDLE ASPIRATION BIOPSY  05/04/2020   Procedure: BRONCHIAL NEEDLE ASPIRATION BIOPSIES;  Surgeon: Garner Nash, DO;  Location: [place];  Service: Pulmonary;;  . BRONCHIAL WASHINGS  05/04/2020   Procedure: BRONCHIAL WASHINGS;  Surgeon: Garner Nash, DO;  Location: [place];  Service: Pulmonary;;  . CORONARY ARTERY BYPASS GRAFT N/A 12/16/2019   Procedure: CORONARY ARTERY BYPASS GRAFTING (CABG) TIMES 4 USING LEFT INTERNAL MAMMARY ARTERY AND ENDOSCOPICALLY HARVESTED RIGHT SAPHENOUS VEIN;  Surgeon: Lajuana Matte, MD;  Location: [place];  Service: Open Heart Surgery;  Laterality: N/A;  . ESOPHAGOGASTRODUODENOSCOPY (EGD) WITH PROPOFOL N/A 06/22/2018   Procedure: ESOPHAGOGASTRODUODENOSCOPY (EGD) WITH PROPOFOL;  Surgeon: [id number] v, DO;  Location: [place] [facility];  Service: Gastroenterology;  Laterality: N/A;  . EXTERNAL FIXATION LEG  01/06/2012   Procedure: EXTERNAL FIXATION LEG;  Surgeon: Meredith Pel, MD;  [facility];  Service: Orthopedics;  Laterality: Left;  Smith-nephew external fixator set  . EXTERNAL FIXATION LEG Right 09/16/2015   Procedure: EXTERNAL FIXATION LEG/LARGE;  Surgeon: Mcarthur Rossetti, MD;  Location: [place];  Service: Orthopedics;  Laterality: Right;  . EXTERNAL FIXATION REMOVAL Right 09/22/2015   Procedure: REMOVAL EXTERNAL FIXATION LEG;  Surgeon: Mcarthur Rossetti, MD;  Location: [place];  Service: Orthopedics;  Laterality: Right;  . FASCIOTOMY  01/10/2012   Procedure: FASCIOTOMY;  Surgeon: Rozanna Box, MD;  Location: [place];  Service: Orthopedics;  Laterality: Left;  status post fasciotomy with wound vac left calf; adjustment external fixator left tibia  under fluoro; retention suture/wound vac placement left lateral calf wound  . FASCIOTOMY  01/06/2012   Procedure: FASCIOTOMY;  Surgeon: Meredith Pel, MD;  [facility]: [facility];  Service: Orthopedics;  Laterality: Left;  Four compartment fasciotomy  . FIDUCIAL MARKER PLACEMENT  05/04/2020   Procedure: FIDUCIAL MARKER PLACEMENT;  Surgeon: Garner Nash, DO;  Location: [place];  Service: Pulmonary;;  . HOT HEMOSTASIS N/A 06/22/2018   Procedure: HEMOSTASIS;  Surgeon: Dorothea Ogle v, DO;  Location: [place];  Service: Gastroenterology;  Laterality: N/A;  Epinepherine and clip placed  . I & D EXTREMITY  01/12/2012   Procedure: IRRIGATION AND DEBRIDEMENT EXTREMITY;  Surgeon: Rozanna Box, MD;  Location: [place];  Service: Orthopedics;  Laterality: Left;  LAYERD CLOSURE LEFT LEG WOUND  . I & D EXTREMITY Right 10/17/2015   Procedure: IRRIGATION AND DEBRIDEMENT EXTREMITY;  Surgeon: Leandrew Koyanagi, MD;  Location: [place];  Service: Orthopedics;  Laterality: Right;  . I & D EXTREMITY Right 10/20/2015   Procedure: REPEAT IRRIGATION AND DEBRIDEMENT EXTREMITY, right lower leg;  Surgeon: Mcarthur Rossetti, MD;  Location: [place];  Service: Orthopedics;  Laterality: Right;  . KNEE SURGERY     left knee and right knee  .  LEFT HEART CATH AND CORONARY ANGIOGRAPHY N/A 06/27/2018   Procedure: LEFT HEART CATH AND CORONARY ANGIOGRAPHY;  Surgeon: Martinique, Shiloh Swopes M, MD;  Location: [place] [facility];  Service: Cardiovascular;  Laterality: N/A;  . LEFT HEART CATH AND CORONARY ANGIOGRAPHY N/A 12/12/2019   Procedure: LEFT HEART CATH AND CORONARY ANGIOGRAPHY;  Surgeon: Wellington Hampshire, MD;  Location: [place] [facility];  Service: Cardiovascular;  Laterality: N/A;  . MASS EXCISION Left 09/22/2015   Procedure: EXCISION MASS;  Surgeon: Mcarthur Rossetti, MD;  Location: [place];  Service: Orthopedics;  Laterality: Left;  . ORIF TIBIA PLATEAU  02/07/2012   Procedure: OPEN REDUCTION INTERNAL FIXATION (ORIF) TIBIAL  PLATEAU;  Surgeon: Rozanna Box, MD;  Location: [place];  Service: Orthopedics;  Laterality: Left;  ORIF LEFT TIBIAL PLATEAU/ REMOVE EXTERNAL FIXATION LEFT LEG  . ORIF TIBIA PLATEAU Right 09/22/2015   Procedure: REMOVAL OF EXTERNAL FIXATOR RIGHT LEG, OPEN REDUCTION INTERNAL FIXATION (ORIF) RIGHT TIBIAL PLATEAU, EXCISION SMALL MASS LEFT LEG;  Surgeon: Mcarthur Rossetti, MD;  Location: [place];  Service: Orthopedics;  Laterality: Right;  . SKIN SPLIT GRAFT Right 10/23/2015   Procedure: RIGHT GASTROC FLAP SPLIT THICKNESS SKIN GRAFT FROM RIGHT THING TO RIGHT LEG;  Surgeon: Irene Limbo, MD;  Location: [place];  Service: Plastics;  Laterality: Right;  . TEE WITHOUT CARDIOVERSION N/A 12/16/2019   Procedure: TRANSESOPHAGEAL ECHOCARDIOGRAM (TEE);  Surgeon: Lajuana Matte, MD;  Location: [place];  Service: Open Heart Surgery;  Laterality: N/A;  . VIDEO BRONCHOSCOPY WITH ENDOBRONCHIAL NAVIGATION Right 05/04/2020   Procedure: VIDEO BRONCHOSCOPY WITH ENDOBRONCHIAL NAVIGATION;  Surgeon: Garner Nash, DO;  Location: [place];  Service: Pulmonary;  Laterality: Right;  . WRIST SURGERY     left    Current Medications: Current Meds  Medication Sig  . albuterol (PROAIR HFA) 108 (90 Base) MCG/ACT inhaler INHALE 2 PUFFS BY MOUTH EVERY 4 HOURS AS NEEDED FOR WHEEZE OR FOR SHORTNESS OF BREATH (Patient taking differently: Inhale 2 puffs into the lungs every 4 (four) hours as needed for wheezing or shortness of breath. )  . ANORO ELLIPTA 62.5-25 MCG/INH AEPB INHALE 1 PUFF INTO THE LUNGS DAILY (Patient taking differently: Inhale 1 puff into the lungs daily. )  . aspirin EC 81 MG tablet Take 81 mg by mouth daily.  . bisacodyl (BISACODYL) 5 MG EC tablet Take 15 mg by mouth daily as needed for moderate constipation.  . cephALEXin (KEFLEX) 500 MG capsule Take 500 mg by mouth 2 (two) times daily.  . fluticasone (FLONASE) 50 MCG/ACT nasal spray SPRAY 2 SPRAYS INTO EACH NOSTRIL EVERY DAY (Patient taking  differently: Place 1 spray into both nostrils daily as needed for allergies. )  . losartan (COZAAR) 25 MG tablet Take 0.5 tablets (12.5 mg total) by mouth daily.  . metoprolol tartrate (LOPRESSOR) 25 MG tablet TAKE 1.5 TABLETS (37.5 MG TOTAL) BY MOUTH 2 (TWO) TIMES DAILY.  . nitroGLYCERIN (NITROSTAT) 0.4 MG SL tablet PLACE 1 TABLET (0.4 MG TOTAL) UNDER THE TONGUE EVERY 5 (FIVE) MINUTES AS NEEDED FOR CHEST PAIN.  Marland Kitchen OXYGEN Inhale 3-4 L into the lungs See admin instructions. Continuous at night, as needed during the day  . oxymetazoline (AFRIN) 0.05 % nasal spray Place 1 spray into both nostrils 2 (two) times daily as needed for congestion (nose bleeds).   . rosuvastatin (CRESTOR) 40 MG tablet Take 1 tablet (40 mg total) by mouth daily.  Marland Kitchen thiamine 100 MG tablet Take 1 tablet (100 mg total) by mouth  daily.  . [DISCONTINUED] rosuvastatin (CRESTOR) 20 MG tablet Take 20 mg by mouth at bedtime.     Allergies:   Ace inhibitors, Ampicillin, Penicillins, Codeine, and Lisinopril   Social History   Socioeconomic History  . Marital status: Married    Spouse name: Not on file  . Number of children: 2  . Years of education: Not on file  . Highest education level: Not on file  Occupational History  . Not on file  Tobacco Use  . Smoking status: Current Every Day Smoker    Packs/day: 1.00    Years: 51.00    Pack years: 51.00    Types: Cigarettes  . Smokeless tobacco: Former Systems developer    Types: Snuff, Chew    Quit date: 01/05/1965  . Tobacco comment: currently smoking 1ppd as of 05/02/20  Vaping Use  . Vaping Use: Never used  Substance and Sexual Activity  . Alcohol use: Yes    Alcohol/week: 6.0 standard drinks    Types: 6 Cans of beer per week    Comment: drinks about a 6-pack/day  . Drug use: No  . Sexual activity: Yes    Partners: Female  Other Topics Concern  . Not on file  Social History Narrative  . Not on file   Social Determinants of Health   Financial Resource Strain:   . Difficulty  of Paying Living Expenses:   Food Insecurity:   . Worried About Charity fundraiser in the Last Year:   . Arboriculturist in the Last Year:   Transportation Needs:   . Film/video editor (Medical):   Marland Kitchen Lack of Transportation (Non-Medical):   Physical Activity:   . Days of Exercise per Week:   . Minutes of Exercise per Session:   Stress:   . Feeling of Stress :   Social Connections:   . Frequency of Communication with Friends and Family:   . Frequency of Social Gatherings with Friends and Family:   . Attends Religious Services:   . Active Member of Clubs or Organizations:   . Attends Archivist Meetings:   Marland Kitchen Marital Status:      Family History: The patient's family history includes Emphysema in his father.  ROS:   Please see the history of present illness.     All other systems reviewed and are negative.  EKGs/Labs/Other Studies Reviewed:    The following studies were reviewed today:  Cath 12/12/2019  Dist LM to Prox LAD lesion is 80% stenosed.  Mid LM to Dist LM lesion is 50% stenosed.  Ost 2nd Diag lesion is 100% stenosed.  Ost Cx to Prox Cx lesion is 85% stenosed.  Prox RCA lesion is 60% stenosed.  Mid RCA-1 lesion is 99% stenosed.  Mid RCA-2 lesion is 50% stenosed.  The left ventricular systolic function is normal.  LV end diastolic pressure is mildly elevated.  The left ventricular ejection fraction is 50-55% by visual estimate.  Ost LM lesion is 40% stenosed.  Prox Cx to Mid Cx lesion is 95% stenosed.  Ost 1st Diag lesion is 60% stenosed.  1. Severe heavily calcified three-vessel coronary artery disease. 2. Normal LV systolic function and mildly elevated left ventricular end-diastolic pressure.  Recommendations: The coronary arteries are not suitable for PCI due to heavy diffuse calcifications. Recommend evaluation for CABG especially that the patient has right lung mass which will likely require resection at the same time. The  patient is also a poor candidate for dual antiplatelet  therapy. There are good distal targets for CABG. Also recommend oncology consultation to ensure he does not have metastatic disease before committing to CABG.    Echo 12/12/2019 1. Left ventricular ejection fraction, by estimation, is 50 to 55%. The  left ventricle has low normal function. The left ventricle demonstrates  regional wall motion abnormalities (see scoring diagram/findings for  description). Left ventricular diastolic  parameters are indeterminate.  2. Right ventricular systolic function is mildly reduced. The right  ventricular size is normal. There is normal pulmonary artery systolic  pressure. The estimated right ventricular systolic pressure is 56.4 mmHg.  3. The mitral valve is grossly normal. Mild mitral valve regurgitation.  4. The aortic valve is tricuspid. There is moderate, nodular  calcification of the left coronary cusp as well as moderate annular  calcification. Aortic valve regurgitation is not visualized.  5. The inferior vena cava is normal in size with greater than 50%  respiratory variability, suggesting right atrial pressure of 3 mmHg.     EKG:  EKG is not ordered today.    Recent Labs: 12/17/2019: Magnesium 2.1 05/02/2020: B Natriuretic Peptide 54.8 05/03/2020: ALT 38 05/06/2020: BUN 12; Creatinine, Ser 0.69; Hemoglobin 11.3; Platelets 163; Potassium 3.5; Sodium 136  Recent Lipid Panel    Component Value Date/Time   CHOL 168 12/15/2019 0315   TRIG 28 12/15/2019 0315   HDL 75 12/15/2019 0315   CHOLHDL 2.2 12/15/2019 0315   VLDL 6 12/15/2019 0315   LDLCALC 87 12/15/2019 0315   LDLDIRECT 105 (H) 05/16/2017 1528   LDLDIRECT 150 (H) 02/13/2013 1502    Physical Exam:    VS:  BP 124/70   Pulse 75   Temp (!) 96.9 F (36.1 C)   Ht 5\' 11"  (1.803 m)   Wt 125 lb 12.8 oz (57.1 kg)   SpO2 99%   BMI 17.55 kg/m     Wt Readings from Last 3 Encounters:  05/20/20 125 lb 12.8 oz (57.1 kg)    05/15/20 126 lb 12.8 oz (57.5 kg)  05/05/20 130 lb 8 oz (59.2 kg)     GEN:  Well nourished, thin BM  in no acute distress HEENT: Normal NECK: No JVD; No carotid bruits LYMPHATICS: No lymphadenopathy CARDIAC: RRR, no murmurs, rubs, gallops RESPIRATORY:  Clear to auscultation without rales, wheezing or rhonchi  ABDOMEN: Soft, non-tender, non-distended MUSCULOSKELETAL:  No edema; No deformity  SKIN: Warm and dry NEUROLOGIC:  Alert and oriented x 3 PSYCHIATRIC:  Normal affect   ASSESSMENT:    1. Coronary artery disease involving coronary bypass graft of native heart without angina pectoris   2. Chronic obstructive pulmonary disease, unspecified COPD type ([place])   3. Essential hypertension   4. Hyperlipidemia LDL goal <70   5. Tobacco abuse   6. Squamous cell carcinoma of right lung (HCC)    PLAN:    In order of problems listed above:  1. CAD s/p CABG in February: He is doing well from a cardiac standpoint. No significant angina. On ASA 81 mg daily. Continue metoprolol and statin.  2. Hypertension: Blood pressure stable on current therapy  3. Hyperlipidemia: Continue Crestor patient has 40 mg and 20 mg listed on medication list. I asked him to verify what dose he is taking at home.  4. COPD: recent  acute exacerbation  5. Tobacco abuse: Although he quit smoking temporarily after bypass surgery, he has since picked up tobacco again.  I urged him to consider tobacco cessation   6. Prediabetes: Managed by  primary care provider.  7. Squamous cell CA RUL Not a surgical candidate. To see oncology to see if a candidate for SBRT   Medication Adjustments/Labs and Tests Ordered: Current medicines are reviewed at length with the patient today.  Concerns regarding medicines are outlined above.  No orders of the defined types were placed in this encounter.  No orders of the defined types were placed in this encounter.   There are no Patient Instructions on file for this visit.    Signed, Tangi Shroff Martinique, MD  05/20/2020 4:33 PM    [place]

## 2020-05-19 DIAGNOSIS — J9621 Acute and chronic respiratory failure with hypoxia: Secondary | ICD-10-CM | POA: Diagnosis not present

## 2020-05-19 DIAGNOSIS — G629 Polyneuropathy, unspecified: Secondary | ICD-10-CM | POA: Diagnosis not present

## 2020-05-19 DIAGNOSIS — M160 Bilateral primary osteoarthritis of hip: Secondary | ICD-10-CM | POA: Diagnosis not present

## 2020-05-19 DIAGNOSIS — M170 Bilateral primary osteoarthritis of knee: Secondary | ICD-10-CM | POA: Diagnosis not present

## 2020-05-19 DIAGNOSIS — J441 Chronic obstructive pulmonary disease with (acute) exacerbation: Secondary | ICD-10-CM | POA: Diagnosis not present

## 2020-05-20 ENCOUNTER — Other Ambulatory Visit: Payer: Self-pay

## 2020-05-20 ENCOUNTER — Encounter: Payer: Self-pay | Admitting: Cardiology

## 2020-05-20 ENCOUNTER — Ambulatory Visit (INDEPENDENT_AMBULATORY_CARE_PROVIDER_SITE_OTHER): Payer: Medicare Other | Admitting: Cardiology

## 2020-05-20 VITALS — BP 124/70 | HR 75 | Temp 96.9°F | Ht 71.0 in | Wt 125.8 lb

## 2020-05-20 DIAGNOSIS — Z720 Tobacco use: Secondary | ICD-10-CM | POA: Diagnosis not present

## 2020-05-20 DIAGNOSIS — E785 Hyperlipidemia, unspecified: Secondary | ICD-10-CM

## 2020-05-20 DIAGNOSIS — I10 Essential (primary) hypertension: Secondary | ICD-10-CM | POA: Diagnosis not present

## 2020-05-20 DIAGNOSIS — I25810 Atherosclerosis of coronary artery bypass graft(s) without angina pectoris: Secondary | ICD-10-CM | POA: Diagnosis not present

## 2020-05-20 DIAGNOSIS — C3491 Malignant neoplasm of unspecified part of right bronchus or lung: Secondary | ICD-10-CM

## 2020-05-20 DIAGNOSIS — J449 Chronic obstructive pulmonary disease, unspecified: Secondary | ICD-10-CM | POA: Diagnosis not present

## 2020-05-21 DIAGNOSIS — M160 Bilateral primary osteoarthritis of hip: Secondary | ICD-10-CM | POA: Diagnosis not present

## 2020-05-21 DIAGNOSIS — J9621 Acute and chronic respiratory failure with hypoxia: Secondary | ICD-10-CM | POA: Diagnosis not present

## 2020-05-21 DIAGNOSIS — M170 Bilateral primary osteoarthritis of knee: Secondary | ICD-10-CM | POA: Diagnosis not present

## 2020-05-21 DIAGNOSIS — J441 Chronic obstructive pulmonary disease with (acute) exacerbation: Secondary | ICD-10-CM | POA: Diagnosis not present

## 2020-05-21 DIAGNOSIS — G629 Polyneuropathy, unspecified: Secondary | ICD-10-CM | POA: Diagnosis not present

## 2020-05-22 DIAGNOSIS — G629 Polyneuropathy, unspecified: Secondary | ICD-10-CM | POA: Diagnosis not present

## 2020-05-22 DIAGNOSIS — J9621 Acute and chronic respiratory failure with hypoxia: Secondary | ICD-10-CM | POA: Diagnosis not present

## 2020-05-22 DIAGNOSIS — M160 Bilateral primary osteoarthritis of hip: Secondary | ICD-10-CM | POA: Diagnosis not present

## 2020-05-22 DIAGNOSIS — M170 Bilateral primary osteoarthritis of knee: Secondary | ICD-10-CM | POA: Diagnosis not present

## 2020-05-22 DIAGNOSIS — J441 Chronic obstructive pulmonary disease with (acute) exacerbation: Secondary | ICD-10-CM | POA: Diagnosis not present

## 2020-05-25 DIAGNOSIS — J449 Chronic obstructive pulmonary disease, unspecified: Secondary | ICD-10-CM | POA: Diagnosis not present

## 2020-05-25 DIAGNOSIS — J90 Pleural effusion, not elsewhere classified: Secondary | ICD-10-CM | POA: Diagnosis not present

## 2020-05-25 DIAGNOSIS — I2510 Atherosclerotic heart disease of native coronary artery without angina pectoris: Secondary | ICD-10-CM | POA: Diagnosis not present

## 2020-05-25 NOTE — Progress Notes (Signed)
Thoracic Location of Tumor / Histology: Right Upper Lobe Lung  Patient presented to the ED on 05/02/2020 with increasing shortness of breath, cough, and increased sputum production.  He is oxygen dependent due to COPD.  PET: unscheduled at this time.  Bronchoscopy 05/04/2020:   CT Chest 05/02/2020: Enlarging spiculated right upper lobe pulmonary nodule measuring 1.4 x 1.4 cm consistent with bronchogenic carcinoma.  Small loculated pleural effusion along the left anterolateral costrophrenic angle, measuring up to 2.2 cm in greatest thickness.  Biopsies of Right Upper Lobe 05/04/2020   Tobacco/Marijuana/Snuff/ETOH use: Current smoker  Past/Anticipated interventions by cardiothoracic surgery, if any:  -Not a good surgical candidate. -Follow-up with [facility] 05/26/2020  Past/Anticipated interventions by medical oncology, if any:   Signs/Symptoms  Weight changes, if any: Lost about 20 pounds since February-March 2021.  Respiratory complaints, if any: SOB, worse with increased activity.  Hemoptysis, if any: Has occasional productive cough with clear/yellow sputum.  He states he coughed up a "lump" of blood yesterday.  Pain issues, if any:  Has occasional chest tightness, takes nitroglycerin.  SAFETY ISSUES:  Prior radiation? No  Pacemaker/ICD? No  Possible current pregnancy? n/a  Is the patient on methotrexate? no  Current Complaints / other details:   -Oxygen dependent: 3 Liters  -Appointment with Dr. Redmond Baseman ENT 06/19/2020

## 2020-05-26 ENCOUNTER — Ambulatory Visit (INDEPENDENT_AMBULATORY_CARE_PROVIDER_SITE_OTHER): Payer: Medicare Other | Admitting: Primary Care

## 2020-05-26 ENCOUNTER — Encounter: Payer: Self-pay | Admitting: Primary Care

## 2020-05-26 ENCOUNTER — Ambulatory Visit
Admission: RE | Admit: 2020-05-26 | Discharge: 2020-05-26 | Disposition: A | Discharge status: Home / Self Care | Payer: Medicare Other | Source: Ambulatory Visit | Attending: Radiation Oncology | Admitting: Radiation Oncology

## 2020-05-26 ENCOUNTER — Encounter: Payer: Self-pay | Admitting: Radiation Oncology

## 2020-05-26 ENCOUNTER — Other Ambulatory Visit: Payer: Self-pay

## 2020-05-26 ENCOUNTER — Ambulatory Visit
Admission: RE | Admit: 2020-05-26 | Discharge: 2020-05-26 | Disposition: A | Discharge status: Home / Self Care | Payer: Medicare Other | Source: Ambulatory Visit

## 2020-05-26 ENCOUNTER — Telehealth: Payer: Self-pay | Admitting: Pulmonary Disease

## 2020-05-26 VITALS — BP 116/68 | HR 78 | Temp 97.3°F | Ht 71.0 in | Wt 127.8 lb

## 2020-05-26 VITALS — Ht 71.0 in | Wt 128.0 lb

## 2020-05-26 DIAGNOSIS — R040 Epistaxis: Secondary | ICD-10-CM

## 2020-05-26 DIAGNOSIS — C3411 Malignant neoplasm of upper lobe, right bronchus or lung: Secondary | ICD-10-CM

## 2020-05-26 DIAGNOSIS — G629 Polyneuropathy, unspecified: Secondary | ICD-10-CM | POA: Diagnosis not present

## 2020-05-26 DIAGNOSIS — J449 Chronic obstructive pulmonary disease, unspecified: Secondary | ICD-10-CM | POA: Diagnosis not present

## 2020-05-26 DIAGNOSIS — M160 Bilateral primary osteoarthritis of hip: Secondary | ICD-10-CM | POA: Diagnosis not present

## 2020-05-26 DIAGNOSIS — J9621 Acute and chronic respiratory failure with hypoxia: Secondary | ICD-10-CM

## 2020-05-26 DIAGNOSIS — M170 Bilateral primary osteoarthritis of knee: Secondary | ICD-10-CM | POA: Diagnosis not present

## 2020-05-26 DIAGNOSIS — J441 Chronic obstructive pulmonary disease with (acute) exacerbation: Secondary | ICD-10-CM | POA: Diagnosis not present

## 2020-05-26 NOTE — Progress Notes (Signed)
Radiation Oncology         [phone] ________________________________  Initial Outpatient Consultation - Conducted via telephone due to current COVID-19 concerns for limiting patient exposure  I spoke with the patient to conduct this consult visit via telephone to spare the patient unnecessary potential exposure in the healthcare setting during the current COVID-19 pandemic. The patient was notified in advance and was offered a [place] meeting to allow for face to face communication but unfortunately reported that they did not have the appropriate resources/technology to support such a visit and instead preferred to proceed with a telephone consult.    Name: Lucas [name]        MRN: [id number]  Date of Service: 05/26/2020 DOB: 04-17-1951  YT:KZSWFUX, Ronnette Juniper, DO  Icard, Octavio Graves, DO     REFERRING PHYSICIAN: Garner Nash, DO   DIAGNOSIS: The encounter diagnosis was Malignant neoplasm of right upper lobe of lung ([place]).   HISTORY OF PRESENT ILLNESS: Lucas [name] is a 69 y.o. male seen at the request of Dr. Valeta Harms for a newly diagnosed lung cancer.  He presented to the emergency department on 05/02/2020 with increasing shortness of breath cough and sputum production, he has a history of COPD and is oxygen dependent.  By CT imaging and enlarging spiculated right upper lobe pulmonary nodule was noted measuring 1.4 x 1.4 cm.  This apparently has been seen previously and has been increasing in size.  No other nodules or adenopathy was identified.  He underwent bronchoscopy on 05/04/2020, atypical cells were seen in the cytology, and squamous cell carcinoma was confirmed by biopsy of the lesion.  He is not a good surgical candidate medically per Dr. Valeta Harms, so at the time of his bronchoscopy fiducial markers were placed.  He is seen to discuss treatment options for his cancer.  A PET scan has been scheduled but has not been performed though this is ordered.    PREVIOUS RADIATION THERAPY: No   PAST  MEDICAL HISTORY:  Past Medical History:  Diagnosis Date  . Acute blood loss anemia 06/21/2018  . Acute upper GI bleed 06/21/2018  . Allergy   . Bilateral AVN of femurs ([place]) 06/25/2018   See 11/2017 Chest CT report  . COPD 12/21/2006  . Environmental allergies   . History of femur fracture 06/25/2018   11/2017 CT AP showed Remote nonunited right femur greater trochanter fracture  . History of Fracture of right tibia 06/26/2018  . History of multiple right thoacic rib fractures 06/25/2018  . History of Right clavicular fracture 06/25/2018  . HYPERLIPIDEMIA 12/21/2006  . HYPERTENSION, BENIGN SYSTEMIC 12/21/2006  . Mallory-Weiss tear   . TOBACCO DEPENDENCE 12/21/2006       PAST SURGICAL HISTORY: Past Surgical History:  Procedure Laterality Date  . ANKLE SURGERY     left ankle  . APPLICATION OF WOUND VAC  01/06/2012   Procedure: APPLICATION OF WOUND VAC;  Surgeon: Meredith Pel, MD;  Location: [facility];  Service: Orthopedics;  Laterality: Left;  . APPLICATION OF WOUND VAC Right 10/17/2015   Procedure: APPLICATION OF WOUND VAC;  Surgeon: Leandrew Koyanagi, MD;  Location: [place];  Service: Orthopedics;  Laterality: Right;  . BIOPSY  06/22/2018   Procedure: BIOPSY;  Surgeon: [id number] v, DO;  Location: [facility] [place] [facility];  Service: Gastroenterology;;  . BRONCHIAL BIOPSY  05/04/2020   Procedure: BRONCHIAL BIOPSIES;  Surgeon: Garner Nash, DO;  Location: [facility] [facility];  Service: Pulmonary;;  . BRONCHIAL BRUSHINGS  05/04/2020   Procedure:  BRONCHIAL BRUSHINGS;  Surgeon: Garner Nash, DO;  Location: [place] [facility];  Service: Pulmonary;;  . BRONCHIAL NEEDLE ASPIRATION BIOPSY  05/04/2020   Procedure: BRONCHIAL NEEDLE ASPIRATION BIOPSIES;  Surgeon: Garner Nash, DO;  Location: [place];  Service: Pulmonary;;  . BRONCHIAL WASHINGS  05/04/2020   Procedure: BRONCHIAL WASHINGS;  Surgeon: Garner Nash, DO;  Location: [place];  Service: Pulmonary;;  . CORONARY ARTERY BYPASS GRAFT N/A 12/16/2019    Procedure: CORONARY ARTERY BYPASS GRAFTING (CABG) TIMES 4 USING LEFT INTERNAL MAMMARY ARTERY AND ENDOSCOPICALLY HARVESTED RIGHT SAPHENOUS VEIN;  Surgeon: Lajuana Matte, MD;  Location: [place];  Service: Open Heart Surgery;  Laterality: N/A;  . ESOPHAGOGASTRODUODENOSCOPY (EGD) WITH PROPOFOL N/A 06/22/2018   Procedure: ESOPHAGOGASTRODUODENOSCOPY (EGD) WITH PROPOFOL;  Surgeon: [id number] v, DO;  Location: [facility];  Service: Gastroenterology;  Laterality: N/A;  . EXTERNAL FIXATION LEG  01/06/2012   Procedure: EXTERNAL FIXATION LEG;  Surgeon: Meredith Pel, MD;  [facility];  Service: Orthopedics;  Laterality: Left;  Smith-nephew external fixator set  . EXTERNAL FIXATION LEG Right 09/16/2015   Procedure: EXTERNAL FIXATION LEG/LARGE;  Surgeon: Mcarthur Rossetti, MD;  Location: [place];  Service: Orthopedics;  Laterality: Right;  . EXTERNAL FIXATION REMOVAL Right 09/22/2015   Procedure: REMOVAL EXTERNAL FIXATION LEG;  Surgeon: Mcarthur Rossetti, MD;  Location: [place];  Service: Orthopedics;  Laterality: Right;  . FASCIOTOMY  01/10/2012   Procedure: FASCIOTOMY;  Surgeon: Rozanna Box, MD;  Location: [place];  Service: Orthopedics;  Laterality: Left;  status post fasciotomy with wound vac left calf; adjustment external fixator left tibia under fluoro; retention suture/wound vac placement left lateral calf wound  . FASCIOTOMY  01/06/2012   Procedure: FASCIOTOMY;  Surgeon: Meredith Pel, MD;  [facility];  Service: Orthopedics;  Laterality: Left;  Four compartment fasciotomy  . FIDUCIAL MARKER PLACEMENT  05/04/2020   Procedure: FIDUCIAL MARKER PLACEMENT;  Surgeon: Garner Nash, DO;  Location: [place] [facility];  Service: Pulmonary;;  . HOT HEMOSTASIS N/A 06/22/2018   Procedure: HEMOSTASIS;  Surgeon: Dorothea Ogle v, DO;  Location: [place];  Service: Gastroenterology;  Laterality: N/A;  Epinepherine and clip placed  . I & D EXTREMITY  01/12/2012   Procedure:  IRRIGATION AND DEBRIDEMENT EXTREMITY;  Surgeon: Rozanna Box, MD;  Location: [place];  Service: Orthopedics;  Laterality: Left;  LAYERD CLOSURE LEFT LEG WOUND  . I & D EXTREMITY Right 10/17/2015   Procedure: IRRIGATION AND DEBRIDEMENT EXTREMITY;  Surgeon: Leandrew Koyanagi, MD;  Location: [place];  Service: Orthopedics;  Laterality: Right;  . I & D EXTREMITY Right 10/20/2015   Procedure: REPEAT IRRIGATION AND DEBRIDEMENT EXTREMITY, right lower leg;  Surgeon: Mcarthur Rossetti, MD;  Location: [place];  Service: Orthopedics;  Laterality: Right;  . KNEE SURGERY     left knee and right knee  . LEFT HEART CATH AND CORONARY ANGIOGRAPHY N/A 06/27/2018   Procedure: LEFT HEART CATH AND CORONARY ANGIOGRAPHY;  Surgeon: Martinique, Peter M, MD;  Location: [place] [facility];  Service: Cardiovascular;  Laterality: N/A;  . LEFT HEART CATH AND CORONARY ANGIOGRAPHY N/A 12/12/2019   Procedure: LEFT HEART CATH AND CORONARY ANGIOGRAPHY;  Surgeon: Wellington Hampshire, MD;  Location: [place] [facility];  Service: Cardiovascular;  Laterality: N/A;  . MASS EXCISION Left 09/22/2015   Procedure: EXCISION MASS;  Surgeon: Mcarthur Rossetti, MD;  Location: [place];  Service: Orthopedics;  Laterality: Left;  . ORIF TIBIA PLATEAU  02/07/2012   Procedure: OPEN REDUCTION  INTERNAL FIXATION (ORIF) TIBIAL PLATEAU;  Surgeon: Rozanna Box, MD;  Location: [place];  Service: Orthopedics;  Laterality: Left;  ORIF LEFT TIBIAL PLATEAU/ REMOVE EXTERNAL FIXATION LEFT LEG  . ORIF TIBIA PLATEAU Right 09/22/2015   Procedure: REMOVAL OF EXTERNAL FIXATOR RIGHT LEG, OPEN REDUCTION INTERNAL FIXATION (ORIF) RIGHT TIBIAL PLATEAU, EXCISION SMALL MASS LEFT LEG;  Surgeon: Mcarthur Rossetti, MD;  Location: [place];  Service: Orthopedics;  Laterality: Right;  . SKIN SPLIT GRAFT Right 10/23/2015   Procedure: RIGHT GASTROC FLAP SPLIT THICKNESS SKIN GRAFT FROM RIGHT THING TO RIGHT LEG;  Surgeon: Irene Limbo, MD;  Location: [place];  Service: Plastics;   Laterality: Right;  . TEE WITHOUT CARDIOVERSION N/A 12/16/2019   Procedure: TRANSESOPHAGEAL ECHOCARDIOGRAM (TEE);  Surgeon: Lajuana Matte, MD;  Location: [place];  Service: Open Heart Surgery;  Laterality: N/A;  . VIDEO BRONCHOSCOPY WITH ENDOBRONCHIAL NAVIGATION Right 05/04/2020   Procedure: VIDEO BRONCHOSCOPY WITH ENDOBRONCHIAL NAVIGATION;  Surgeon: Garner Nash, DO;  Location: [place];  Service: Pulmonary;  Laterality: Right;  . WRIST SURGERY     left     FAMILY HISTORY:  Family History  Problem Relation Age of Onset  . Emphysema Father      SOCIAL HISTORY:  reports that he has been smoking cigarettes. He has a 51.00 pack-year smoking history. He quit smokeless tobacco use about 55 years ago.  His smokeless tobacco use included snuff and chew. He reports current alcohol use of about 6.0 standard drinks of alcohol per week. He reports that he does not use drugs. The patient is married and lives in [place].   ALLERGIES: Ace inhibitors, Ampicillin, Penicillins, Codeine, and Lisinopril   MEDICATIONS:  Current Outpatient Medications  Medication Sig Dispense Refill  . albuterol (PROAIR HFA) 108 (90 Base) MCG/ACT inhaler INHALE 2 PUFFS BY MOUTH EVERY 4 HOURS AS NEEDED FOR WHEEZE OR FOR SHORTNESS OF BREATH (Patient taking differently: Inhale 2 puffs into the lungs every 4 (four) hours as needed for wheezing or shortness of breath. ) 6.7 g 6  . ANORO ELLIPTA 62.5-25 MCG/INH AEPB INHALE 1 PUFF INTO THE LUNGS DAILY (Patient taking differently: Inhale 1 puff into the lungs daily. ) 60 each 3  . fluticasone (FLONASE) 50 MCG/ACT nasal spray SPRAY 2 SPRAYS INTO EACH NOSTRIL EVERY DAY (Patient taking differently: Place 1 spray into both nostrils daily as needed for allergies. ) 16 g 2  . nitroGLYCERIN (NITROSTAT) 0.4 MG SL tablet PLACE 1 TABLET (0.4 MG TOTAL) UNDER THE TONGUE EVERY 5 (FIVE) MINUTES AS NEEDED FOR CHEST PAIN. 150 tablet 1  . OXYGEN Inhale 3-4 L into the lungs See admin  instructions. Continuous at night, as needed during the day    . oxymetazoline (AFRIN) 0.05 % nasal spray Place 1 spray into both nostrils 2 (two) times daily as needed for congestion (nose bleeds).     . rosuvastatin (CRESTOR) 40 MG tablet Take 1 tablet (40 mg total) by mouth daily. 90 tablet 3  . thiamine 100 MG tablet Take 1 tablet (100 mg total) by mouth daily. 30 tablet 0  . aspirin EC 81 MG tablet Take 81 mg by mouth daily. (Patient not taking: Reported on 05/26/2020)    . bisacodyl (BISACODYL) 5 MG EC tablet Take 15 mg by mouth daily as needed for moderate constipation.    Marland Kitchen losartan (COZAAR) 25 MG tablet Take 0.5 tablets (12.5 mg total) by mouth daily. (Patient not taking: Reported on 05/26/2020) 30 tablet 3  . metoprolol tartrate (  LOPRESSOR) 25 MG tablet TAKE 1.5 TABLETS (37.5 MG TOTAL) BY MOUTH 2 (TWO) TIMES DAILY. (Patient not taking: Reported on 05/26/2020) 270 tablet 1   No current facility-administered medications for this encounter.     REVIEW OF SYSTEMS: On review of systems, the patient reports that he has been having nose bleeds since starting O2. He is doing well overall. He reports he is only short of breath with exertion.  No other complaints are noted.    PHYSICAL EXAM:  Wt Readings from Last 3 Encounters:  05/26/20 128 lb (58.1 kg)  05/20/20 125 lb 12.8 oz (57.1 kg)  05/15/20 126 lb 12.8 oz (57.5 kg)   Pain Assessment Pain Score: 0-No pain/10   Unable to assess due to encounter type.  ECOG = 1  0 - Asymptomatic (Fully active, able to carry on all predisease activities without restriction)  1 - Symptomatic but completely ambulatory (Restricted in physically strenuous activity but ambulatory and able to carry out work of a light or sedentary nature. For example, light housework, office work)  2 - Symptomatic, <50% in bed during the day (Ambulatory and capable of all self care but unable to carry out any work activities. Up and about more than 50% of waking hours)  3  - Symptomatic, >50% in bed, but not bedbound (Capable of only limited self-care, confined to bed or chair 50% or more of waking hours)  4 - Bedbound (Completely disabled. Cannot carry on any self-care. Totally confined to bed or chair)  5 - Death   Eustace Pen MM, Creech RH, Tormey DC, et al. [phone]). "Toxicity and response criteria of the [facility]". [place] Oncol. 5 (6): 649-55    LABORATORY DATA:  Lab Results  Component Value Date   WBC 4.8 05/06/2020   HGB 11.3 (L) 05/06/2020   HCT 34.2 (L) 05/06/2020   MCV 93.2 05/06/2020   PLT 163 05/06/2020   Lab Results  Component Value Date   NA 136 05/06/2020   K 3.5 05/06/2020   CL 99 05/06/2020   CO2 27 05/06/2020   Lab Results  Component Value Date   ALT 38 05/03/2020   AST 63 (H) 05/03/2020   ALKPHOS 74 05/03/2020   BILITOT 0.8 05/03/2020      RADIOGRAPHY: CT CHEST WO CONTRAST  Result Date: 05/02/2020 CLINICAL DATA:  Shortness of breath, history of spiculated right upper lobe lung nodule concerning for lung cancer, pleural effusion EXAM: CT CHEST WITHOUT CONTRAST TECHNIQUE: Multidetector CT imaging of the chest was performed following the standard protocol without IV contrast. COMPARISON:  12/12/2019, 12/08/2017 FINDINGS: Cardiovascular: Unenhanced imaging of the heart and great vessels demonstrates no pericardial effusion. There is extensive atherosclerosis throughout the coronary vasculature and thoracic aorta. Mediastinum/Nodes: No enlarged mediastinal or axillary lymph nodes. Thyroid gland, trachea, and esophagus demonstrate no significant findings. Postsurgical changes from median sternotomy. Lungs/Pleura: There has been further increase in size of the spiculated left upper lobe pulmonary nodule, now measuring 1.4 x 1.4 by 1.4 cm. The appearance is most consistent with bronchogenic carcinoma. No other pulmonary nodules or masses. Upper lobe predominant emphysema again noted. No acute airspace disease or  pneumothorax. Loculated pleural fluid at the left anterolateral costophrenic angle measures up to 2.2 cm, likely related to interval CABG. Upper Abdomen: No acute abnormality. Musculoskeletal: No acute or destructive bony lesions. Prior healed right rib fractures are noted. Postsurgical changes from median sternotomy. Reconstructed images demonstrate no additional findings. IMPRESSION: 1. Enlarging spiculated right upper lobe  pulmonary nodule consistent with bronchogenic carcinoma. 2. Small loculated pleural effusion along the left anterolateral costophrenic angle, measuring up to 2.2 cm in greatest thickness. This is likely related to interval CABG. 3. Aortic Atherosclerosis (ICD10-I70.0) and Emphysema (ICD10-J43.9). These results will be called to the ordering clinician or representative by the Radiologist Assistant, and communication documented in the PACS or [employer]. Electronically Signed   By: Randa Ngo M.D.   On: 05/02/2020 21:44   DG CHEST PORT 1 VIEW  Result Date: 05/04/2020 CLINICAL DATA:  Postoperative evaluation after bronchoscopy, history of right upper lobe pulmonary nodule EXAM: PORTABLE CHEST 1 VIEW COMPARISON:  05/02/2020 FINDINGS: Single frontal view of the chest demonstrates interval placement of 3 fiduciary markers in the right upper lobe at the site of known spiculated lung nodule. Stable loculated pleural fluid left lateral costophrenic angle. Stable posttraumatic changes right thoracic cage. No evidence of a pneumothorax after bronchoscopy. Cardiac silhouette is stable. IMPRESSION: 1. No complication after bronchoscopy. Fiduciary markers identified in the right upper lobe. Electronically Signed   By: Randa Ngo M.D.   On: 05/04/2020 15:37   DG Chest Port 1 View  Result Date: 05/02/2020 CLINICAL DATA:  Shortness of breath EXAM: PORTABLE CHEST 1 VIEW COMPARISON:  January 31, 2020 chest radiograph; CT angiogram chest December 12, 2019 FINDINGS: There is a stable right  pleural effusion with right base atelectasis. A rounded opacity remains at the lateral left base measuring 4.0 x 3.3 cm. Lungs elsewhere are clear. Heart is upper normal in size with pulmonary vascularity. Patient is status post coronary artery bypass grafting. There old healed rib fractures on the right. IMPRESSION: Stable right pleural effusion with right base atelectasis. Stable nodular appearing opacity lateral left base. Question chronic loculated pleural effusion or rounded atelectasis. Note that this finding was not present on February 2021 study. It may be prudent to correlate with noncontrast enhanced chest CT to further assess this area. Heart upper normal in size with postoperative changes. Old rib trauma on the right. Electronically Signed   By: Lowella Grip III M.D.   On: 05/02/2020 08:35   DG C-ARM BRONCHOSCOPY  Result Date: 05/04/2020 C-ARM BRONCHOSCOPY: Fluoroscopy was utilized by the requesting physician.  No radiographic interpretation.       IMPRESSION/PLAN: 1. Stage IA2, cT1bN0M0, NSCLC, Squamous Cell Carcinoma of the RUL. Dr. Lisbeth Renshaw discusses the pathology findings and reviews the nature of early stage lung cancers. He discusses that for patients who are not surgical candidates, stereotactic body radiotherapy (SBRT) would be a good alternative. He appears to be a good candidate for SBRT. We discussed the risks, benefits, short, and long term effects of radiotherapy, and the patient is interested in proceeding. Dr. Lisbeth Renshaw discusses the delivery and logistics of radiotherapy and anticipates a course of 3-5 fractions of radiotherapy. The patient will still need his PET scan, and we will follow up with the results of this prior to proceeding. He will come in for simulation on Thursday of this week and start once we know his PET scan results. 2. Epistaxis. He was encouraged to use saline nose sprays and resume ASA as he stopped taking this after the nosebleeds.   Given current  concerns for patient exposure during the COVID-19 pandemic, this encounter was conducted via telephone.  The patient has provided two factor identification and has given verbal consent for this type of encounter and has been advised to only accept a meeting of this type in a secure network environment. The time spent  during this encounter was 45 minutes including preparation, discussion, and coordination of the patient's care. The attendants for this meeting include Blenda Nicely, RN, Dr. Lisbeth Renshaw, Hayden Pedro  and [name].  During the encounter,  Blenda Nicely, RN, Dr. Lisbeth Renshaw, and Hayden Pedro were located at [facility] Radiation Oncology Department.  Lucas [name] was located at home.   The above documentation reflects my direct findings during this shared patient visit. Please see the separate note by Dr. Lisbeth Renshaw on this date for the remainder of the patient's plan of care.    Carola Rhine, PAC

## 2020-05-26 NOTE — Telephone Encounter (Signed)
Spoke to Lucas [name] told her we can resc pt was a no show last time

## 2020-05-26 NOTE — Patient Instructions (Addendum)
  COPD: - Continue Anoro one puff daily - Continue Ventolin rescue inhaler 2 puffs every 4-6 hours for breakthrough shortness of breath/wheezing  Chronic respiratory failure: - Continue 3L oxygen on exertion - Qualify patient for POC   Nose bleeds: - Try ocean nasal spray twice a day (over the counter) - AYR nasal gel at bedtime (over the counter) - Use flonase as needed for nasal congestion  - Back off Afrin nasal spray, only use as needed for no more than 3 days - Follow up with ENT Dr. Redmond Baseman this month as scheduled  Pulmonary nodules: - Starting SBRT 8/9 - PET scan scheduled for 8/10  Follow-up: - 3 months with Dr. Valeta Harms

## 2020-05-26 NOTE — Progress Notes (Signed)
@Patient  ID: Lucas [name], male    DOB: 1951/08/01, 69 y.o.   MRN: [id number]  Chief Complaint  Patient presents with   Follow-up    Lung nodule    Referring provider: [name], DO  HPI: 69 year old male, current smoker (51 pack year hx). PMH significant for COPD, malignant neoplasm RUL, pleural effusion right, acute on chronic respiratory failure, CAD, NSTEMI, unstable angina, cirrhosis liver, duodenal ulcer/H. Pylori, alcohol use disorder. Patient of Dr. Valeta Harms, seen for intial consult on 02/12/20 for RUL lung nodule.  Previous [facility] pulmonary encounter: 02/12/20- Dr. Dorena Dew, Consult This is a 69 year old gentleman history of COPD, chronic hypoxemic respiratory failure on 3 L nasal cannula, history of hypertension.  Patient had a CT scan of the chest and February 2021 which revealed a 11 mm right upper lobe lung nodule concerning for a primary bronchogenic carcinoma.  Patient was referred to cardiothoracic surgery for evaluation patient was seen by Dr. Kipp Brood on 01/31/2020.Marland Kitchen  Patient underwent CABG by Dr. Kipp Brood several weeks prior.  He is now doing well post CABG and was referred for evaluation of right upper lobe nodule potential for navigational bronchoscopy, biopsy.  Unfortunately he still smoking.  And Dr. Kipp Brood is deemed him not a surgical candidate at this time unless he is able to quit smoking.  Inevitably may be better for consideration of SBRT.  OV 02/12/2020: Patient present the office today.  Denies hemoptysis.  Does have some weight loss.  He has recovered good from his previous surgery.  He still feels weak at times.  Denies any significant lower extremity edema or dyspnea on exertion at his baseline.  He does use oxygen for 3 L nasal cannula.  He has present today with his daughter in the office.  Discussion of management with Dr. Kipp Brood from cardiothoracic surgery.  If patient is a ongoing smoker and unwilling to quit Dr. Kipp Brood has deemed patient not to be a  surgical candidate. Therefore, we will plan for video bronchoscopy with electromagnetic navigation as well as fiducial placement for possible SBRT candidacy.  Today in the office we discussed the risk, benefits and alternatives of proceeding with navigational bronchoscopy to include bleeding, pneumothorax as well as potential for hospitalization following procedure.  Patient is agreeable to these risks and is willing to proceed. They would like to wait until the patient's daughter is able to be here in town to help and bring him to this.  Next available time for this will be Mar 03, 2020.  In the meantime we will obtain the imaging ordered above and plan for the procedure at this time.   05/26/2020- Interim hx Patient presents today for 3-4 month follow-up. Patient had CT chest in February 2021 that showed RUL spiculated nodule measuring 81mm concerning for primary bronchogenic carcinoma. Patient cancelled bronchoscopy scheduled for 03/03/20 d/t transportation. PET scan ordered by Dr. Valeta Harms which has been scheduled for 06/02/20. He had virtual consult with Dr. Lisbeth Renshaw today at [facility] to start SBRT 06/01/20.   Using Anoro once a day in the morning. Anxiety will worsen his breathing. He may use albuterol 1-2 times a day at most. He coughed up some blood yesterday, however, he does have morning nose bleeds. He has been using afrin nasal spray. He is not on blood thinner. He stopped taking aspirin. He has an apt with Dr. Redmond Baseman this month with ENT. He has cut down smoking from 1ppd to 1/2 pack daily.    Chest Imaging: February 2021  CT chest: Right upper lobe 11 mm spiculated lung nodule concerning for primary bronchogenic carcinoma.  Pulmonary Functions Testing Results: PFT Results Latest Ref Rng & Units 12/13/2019  FVC-Pre L 2.76  FVC-Predicted Pre % 101  Pre FEV1/FVC % % 48  FEV1-Pre L 1.32  FEV1-Predicted Pre % 65   Allergies  Allergen Reactions   Ace Inhibitors Swelling   Ampicillin  Nausea And Vomiting   Penicillins Nausea And Vomiting    Did it involve swelling of the face/tongue/throat, SOB, or low BP? N Did it involve sudden or severe rash/hives, skin peeling, or any reaction on the inside of your mouth or nose? N Did you need to seek medical attention at a hospital or doctor's office? N When did it last happen?Teenager If all above answers are NO, may proceed with cephalosporin use.   Codeine Nausea And Vomiting   Lisinopril Swelling    REACTION: angioedema    Immunization History  Administered Date(s) Administered   Fluad Quad(high Dose 65+) 08/27/2019   Influenza Split 01/07/2012   Influenza Whole 08/22/2007   Influenza,inj,Quad PF,6+ Mos 09/02/2013, 09/04/2014, 09/18/2015, 01/03/2017, 10/27/2017   Moderna SARS-COVID-2 Vaccination 01/09/2020, 02/11/2020   Pneumococcal Conjugate-13 01/03/2017   Pneumococcal Polysaccharide-23 09/06/2007, 09/04/2014   Td 10/25/2003   Tdap 09/13/2015    Past Medical History:  Diagnosis Date   Acute blood loss anemia 06/21/2018   Acute upper GI bleed 06/21/2018   Allergy    Bilateral AVN of femurs ([place]) 06/25/2018   See 11/2017 Chest CT report   COPD 12/21/2006   Environmental allergies    History of femur fracture 06/25/2018   11/2017 CT AP showed Remote nonunited right femur greater trochanter fracture   History of Fracture of right tibia 06/26/2018   History of multiple right thoacic rib fractures 06/25/2018   History of Right clavicular fracture 06/25/2018   HYPERLIPIDEMIA 12/21/2006   HYPERTENSION, BENIGN SYSTEMIC 12/21/2006   Mallory-Weiss tear    TOBACCO DEPENDENCE 12/21/2006    Tobacco History: Social History   Tobacco Use  Smoking Status Current Every Day Smoker   Packs/day: 0.50   Years: 51.00   Pack years: 25.50   Types: Cigarettes  Smokeless Tobacco Former Systems developer   Types: Snuff, Chew   Quit date: 01/05/1965  Tobacco Comment   currently half a pack a day-05/26/2020    Ready to quit: No Counseling given: Yes Comment: currently half a pack a day-05/26/2020   Outpatient Medications Prior to Visit  Medication Sig Dispense Refill   albuterol (PROAIR HFA) 108 (90 Base) MCG/ACT inhaler INHALE 2 PUFFS BY MOUTH EVERY 4 HOURS AS NEEDED FOR WHEEZE OR FOR SHORTNESS OF BREATH (Patient taking differently: Inhale 2 puffs into the lungs every 4 (four) hours as needed for wheezing or shortness of breath. ) 6.7 g 6   ANORO ELLIPTA 62.5-25 MCG/INH AEPB INHALE 1 PUFF INTO THE LUNGS DAILY (Patient taking differently: Inhale 1 puff into the lungs daily. ) 60 each 3   fluticasone (FLONASE) 50 MCG/ACT nasal spray SPRAY 2 SPRAYS INTO EACH NOSTRIL EVERY DAY (Patient taking differently: Place 1 spray into both nostrils daily as needed for allergies. ) 16 g 2   nitroGLYCERIN (NITROSTAT) 0.4 MG SL tablet PLACE 1 TABLET (0.4 MG TOTAL) UNDER THE TONGUE EVERY 5 (FIVE) MINUTES AS NEEDED FOR CHEST PAIN. 150 tablet 1   OXYGEN Inhale 3-4 L into the lungs See admin instructions. Continuous at night, as needed during the day     oxymetazoline (AFRIN) 0.05 % nasal spray  Place 1 spray into both nostrils 2 (two) times daily as needed for congestion (nose bleeds).      aspirin EC 81 MG tablet Take 81 mg by mouth daily.  (Patient not taking: Reported on 05/26/2020)     bisacodyl (BISACODYL) 5 MG EC tablet Take 15 mg by mouth daily as needed for moderate constipation. (Patient not taking: Reported on 05/26/2020)     losartan (COZAAR) 25 MG tablet Take 0.5 tablets (12.5 mg total) by mouth daily. (Patient not taking: Reported on 05/26/2020) 30 tablet 3   metoprolol tartrate (LOPRESSOR) 25 MG tablet TAKE 1.5 TABLETS (37.5 MG TOTAL) BY MOUTH 2 (TWO) TIMES DAILY. (Patient not taking: Reported on 05/26/2020) 270 tablet 1   rosuvastatin (CRESTOR) 40 MG tablet Take 1 tablet (40 mg total) by mouth daily. (Patient not taking: Reported on 05/26/2020) 90 tablet 3   thiamine 100 MG tablet Take 1 tablet (100 mg  total) by mouth daily. (Patient not taking: Reported on 05/26/2020) 30 tablet 0   No facility-administered medications prior to visit.    Review of Systems  Review of Systems  Constitutional: Negative.   HENT: Positive for nosebleeds.   Respiratory: Positive for cough.   Cardiovascular: Negative.     Physical Exam  BP 116/68 (BP Location: Left Arm, Cuff Size: Normal)    Pulse 78    Temp (!) 97.3 F (36.3 C) (Oral)    Ht 5\' 11"  (1.803 m)    Wt 127 lb 12.8 oz (58 kg)    SpO2 96%    BMI 17.82 kg/m  Physical Exam Constitutional:      Appearance: Normal appearance.  HENT:     Mouth/Throat:     Mouth: Mucous membranes are moist.     Pharynx: Oropharynx is clear.  Cardiovascular:     Rate and Rhythm: Normal rate and regular rhythm.  Pulmonary:     Effort: Pulmonary effort is normal.  Neurological:     Mental Status: He is alert.  Psychiatric:        Mood and Affect: Mood normal.        Behavior: Behavior normal.        Thought Content: Thought content normal.        Judgment: Judgment normal.      Lab Results:  CBC    Component Value Date/Time   WBC 4.8 05/06/2020 0546   RBC 3.67 (L) 05/06/2020 0546   HGB 11.3 (L) 05/06/2020 0546   HGB 10.7 (L) 07/03/2018 1045   HCT 34.2 (L) 05/06/2020 0546   HCT 31.8 (L) 07/03/2018 1045   PLT 163 05/06/2020 0546   PLT 381 07/03/2018 1045   MCV 93.2 05/06/2020 0546   MCV 96 07/03/2018 1045   MCH 30.8 05/06/2020 0546   MCHC 33.0 05/06/2020 0546   RDW 14.6 05/06/2020 0546   RDW 14.2 07/03/2018 1045   LYMPHSABS 2.4 05/02/2020 0743   LYMPHSABS 1.1 07/03/2018 1045   MONOABS 0.6 05/02/2020 0743   EOSABS 0.1 05/02/2020 0743   EOSABS 0.1 07/03/2018 1045   BASOSABS 0.0 05/02/2020 0743   BASOSABS 0.0 07/03/2018 1045    BMET    Component Value Date/Time   NA 136 05/06/2020 0546   NA 138 07/03/2018 1045   K 3.5 05/06/2020 0546   CL 99 05/06/2020 0546   CO2 27 05/06/2020 0546   GLUCOSE 98 05/06/2020 0546   BUN 12 05/06/2020 0546    BUN 6 (L) 07/03/2018 1045   CREATININE 0.69 05/06/2020 0546  CREATININE 0.90 01/28/2014 1438   CALCIUM 9.3 05/06/2020 0546   GFRNONAA >60 05/06/2020 0546   GFRAA >60 05/06/2020 0546    BNP    Component Value Date/Time   BNP 54.8 05/02/2020 0743    ProBNP No results found for: PROBNP  Imaging: CT CHEST WO CONTRAST  Result Date: 05/02/2020 CLINICAL DATA:  Shortness of breath, history of spiculated right upper lobe lung nodule concerning for lung cancer, pleural effusion EXAM: CT CHEST WITHOUT CONTRAST TECHNIQUE: Multidetector CT imaging of the chest was performed following the standard protocol without IV contrast. COMPARISON:  12/12/2019, 12/08/2017 FINDINGS: Cardiovascular: Unenhanced imaging of the heart and great vessels demonstrates no pericardial effusion. There is extensive atherosclerosis throughout the coronary vasculature and thoracic aorta. Mediastinum/Nodes: No enlarged mediastinal or axillary lymph nodes. Thyroid gland, trachea, and esophagus demonstrate no significant findings. Postsurgical changes from median sternotomy. Lungs/Pleura: There has been further increase in size of the spiculated left upper lobe pulmonary nodule, now measuring 1.4 x 1.4 by 1.4 cm. The appearance is most consistent with bronchogenic carcinoma. No other pulmonary nodules or masses. Upper lobe predominant emphysema again noted. No acute airspace disease or pneumothorax. Loculated pleural fluid at the left anterolateral costophrenic angle measures up to 2.2 cm, likely related to interval CABG. Upper Abdomen: No acute abnormality. Musculoskeletal: No acute or destructive bony lesions. Prior healed right rib fractures are noted. Postsurgical changes from median sternotomy. Reconstructed images demonstrate no additional findings. IMPRESSION: 1. Enlarging spiculated right upper lobe pulmonary nodule consistent with bronchogenic carcinoma. 2. Small loculated pleural effusion along the left anterolateral  costophrenic angle, measuring up to 2.2 cm in greatest thickness. This is likely related to interval CABG. 3. Aortic Atherosclerosis (ICD10-I70.0) and Emphysema (ICD10-J43.9). These results will be called to the ordering clinician or representative by the Radiologist Assistant, and communication documented in the PACS or [employer]. Electronically Signed   By: Randa Ngo M.D.   On: 05/02/2020 21:44   DG CHEST PORT 1 VIEW  Result Date: 05/04/2020 CLINICAL DATA:  Postoperative evaluation after bronchoscopy, history of right upper lobe pulmonary nodule EXAM: PORTABLE CHEST 1 VIEW COMPARISON:  05/02/2020 FINDINGS: Single frontal view of the chest demonstrates interval placement of 3 fiduciary markers in the right upper lobe at the site of known spiculated lung nodule. Stable loculated pleural fluid left lateral costophrenic angle. Stable posttraumatic changes right thoracic cage. No evidence of a pneumothorax after bronchoscopy. Cardiac silhouette is stable. IMPRESSION: 1. No complication after bronchoscopy. Fiduciary markers identified in the right upper lobe. Electronically Signed   By: Randa Ngo M.D.   On: 05/04/2020 15:37   DG Chest Port 1 View  Result Date: 05/02/2020 CLINICAL DATA:  Shortness of breath EXAM: PORTABLE CHEST 1 VIEW COMPARISON:  January 31, 2020 chest radiograph; CT angiogram chest December 12, 2019 FINDINGS: There is a stable right pleural effusion with right base atelectasis. A rounded opacity remains at the lateral left base measuring 4.0 x 3.3 cm. Lungs elsewhere are clear. Heart is upper normal in size with pulmonary vascularity. Patient is status post coronary artery bypass grafting. There old healed rib fractures on the right. IMPRESSION: Stable right pleural effusion with right base atelectasis. Stable nodular appearing opacity lateral left base. Question chronic loculated pleural effusion or rounded atelectasis. Note that this finding was not present on February 2021  study. It may be prudent to correlate with noncontrast enhanced chest CT to further assess this area. Heart upper normal in size with postoperative changes. Old rib trauma on  the right. Electronically Signed   By: Lowella Grip III M.D.   On: 05/02/2020 08:35   DG C-ARM BRONCHOSCOPY  Result Date: 05/04/2020 C-ARM BRONCHOSCOPY: Fluoroscopy was utilized by the requesting physician.  No radiographic interpretation.     Assessment & Plan:   Malignant neoplasm of right upper lobe of lung ([place]) - CT chest in February showed RUL spiculated nodule measuring 54mm concerning for primary bronchogenic carcinoma. Patient cancelled bronchoscopy scheduled for 03/03/20 d/t transportation.  - PET scan ordered by Dr. Valeta Harms which has been scheduled for 06/02/20.  - He had virtual consult with Dr. Lisbeth Renshaw on 05/26/2020 and is planning to start SBRT 06/01/20  COPD with chronic bronchitis ([place]) - Stable; No recent exacerbations. Using SABA 1-2 times a day - Continue Anoro Elipta one puff daily; Ventolin rescue inhaler 2 puffs every 4-6 hours for breakthrough shortness of breath/wheezing - FU in 3 months with Dr. Valeta Harms of [facility]    Acute on chronic respiratory failure with hypoxia ([place]) - Continues to wear 3L oxygen - Patient qualified for POC today   Epistaxis - Recommend ocean nasal spray twice a day and AYR nasal gel at bedtime  - Use flonase as needed for nasal congestion  - Back off Afrin nasal spray, only use as needed for no more than 3 days - Follow up with ENT Dr. Redmond Baseman this month as scheduled    Martyn Ehrich, NP 05/28/2020

## 2020-05-28 ENCOUNTER — Encounter: Payer: Self-pay | Admitting: Primary Care

## 2020-05-28 DIAGNOSIS — J441 Chronic obstructive pulmonary disease with (acute) exacerbation: Secondary | ICD-10-CM | POA: Diagnosis not present

## 2020-05-28 DIAGNOSIS — J9621 Acute and chronic respiratory failure with hypoxia: Secondary | ICD-10-CM | POA: Diagnosis not present

## 2020-05-28 DIAGNOSIS — M170 Bilateral primary osteoarthritis of knee: Secondary | ICD-10-CM | POA: Diagnosis not present

## 2020-05-28 DIAGNOSIS — M160 Bilateral primary osteoarthritis of hip: Secondary | ICD-10-CM | POA: Diagnosis not present

## 2020-05-28 DIAGNOSIS — G629 Polyneuropathy, unspecified: Secondary | ICD-10-CM | POA: Diagnosis not present

## 2020-05-28 NOTE — Assessment & Plan Note (Signed)
-   Continues to wear 3L oxygen - Patient qualified for POC today

## 2020-05-28 NOTE — Assessment & Plan Note (Signed)
-   Recommend ocean nasal spray twice a day and AYR nasal gel at bedtime  - Use flonase as needed for nasal congestion  - Back off Afrin nasal spray, only use as needed for no more than 3 days - Follow up with ENT Dr. Redmond Baseman this month as scheduled

## 2020-05-28 NOTE — Assessment & Plan Note (Addendum)
-   Stable; No recent exacerbations. Using SABA 1-2 times a day - Continue Anoro Elipta one puff daily; Ventolin rescue inhaler 2 puffs every 4-6 hours for breakthrough shortness of breath/wheezing - FU in 3 months with Dr. Valeta Harms of [facility]

## 2020-05-28 NOTE — Assessment & Plan Note (Signed)
-   CT chest in February showed RUL spiculated nodule measuring 33mm concerning for primary bronchogenic carcinoma. Patient cancelled bronchoscopy scheduled for 03/03/20 d/t transportation.  - PET scan ordered by Dr. Valeta Harms which has been scheduled for 06/02/20.  - He had virtual consult with Dr. Lisbeth Renshaw on 05/26/2020 and is planning to start SBRT 06/01/20

## 2020-06-01 ENCOUNTER — Ambulatory Visit
Admission: RE | Admit: 2020-06-01 | Discharge: 2020-06-01 | Disposition: A | Discharge status: Home / Self Care | Payer: Medicare Other | Source: Ambulatory Visit | Attending: Radiation Oncology | Admitting: Radiation Oncology

## 2020-06-01 ENCOUNTER — Other Ambulatory Visit: Payer: Self-pay

## 2020-06-01 DIAGNOSIS — Z510 Encounter for antineoplastic radiation therapy: Secondary | ICD-10-CM | POA: Diagnosis not present

## 2020-06-01 DIAGNOSIS — C3411 Malignant neoplasm of upper lobe, right bronchus or lung: Secondary | ICD-10-CM | POA: Diagnosis not present

## 2020-06-02 ENCOUNTER — Encounter (HOSPITAL_COMMUNITY): Payer: Medicare Other

## 2020-06-02 ENCOUNTER — Telehealth: Payer: Self-pay

## 2020-06-02 DIAGNOSIS — J9621 Acute and chronic respiratory failure with hypoxia: Secondary | ICD-10-CM | POA: Diagnosis not present

## 2020-06-02 DIAGNOSIS — M170 Bilateral primary osteoarthritis of knee: Secondary | ICD-10-CM | POA: Diagnosis not present

## 2020-06-02 DIAGNOSIS — G629 Polyneuropathy, unspecified: Secondary | ICD-10-CM | POA: Diagnosis not present

## 2020-06-02 DIAGNOSIS — M160 Bilateral primary osteoarthritis of hip: Secondary | ICD-10-CM | POA: Diagnosis not present

## 2020-06-02 DIAGNOSIS — J441 Chronic obstructive pulmonary disease with (acute) exacerbation: Secondary | ICD-10-CM | POA: Diagnosis not present

## 2020-06-02 NOTE — Telephone Encounter (Signed)
Received phone call from [facility] RN regarding patient. Reports that patient is having chest and abdominal tightness with increased HR. Vital Signs as follows: BP- 130/70, HR: 120, SpO2: 97% on 3 L [place]. RN also reports paleness, decreased appetite, fatigue and increased tremors in hands. Due to cardiac history and present symptoms advised that patient be evaluated in ED. [facility] RN verbalized understanding and will direct patient to report to ED for evaluation.   FYI to PCP  Talbot Grumbling, RN

## 2020-06-05 ENCOUNTER — Other Ambulatory Visit: Payer: Self-pay

## 2020-06-05 ENCOUNTER — Encounter (HOSPITAL_COMMUNITY)
Admission: RE | Admit: 2020-06-05 | Discharge: 2020-06-05 | Disposition: A | Discharge status: Home / Self Care | Payer: Medicare Other | Source: Ambulatory Visit | Attending: Pulmonary Disease | Admitting: Pulmonary Disease

## 2020-06-05 DIAGNOSIS — R911 Solitary pulmonary nodule: Secondary | ICD-10-CM | POA: Diagnosis not present

## 2020-06-05 DIAGNOSIS — I700 Atherosclerosis of aorta: Secondary | ICD-10-CM | POA: Diagnosis not present

## 2020-06-05 DIAGNOSIS — N200 Calculus of kidney: Secondary | ICD-10-CM | POA: Diagnosis not present

## 2020-06-05 LAB — GLUCOSE, CAPILLARY: Glucose-Capillary: 106 mg/dL — ABNORMAL HIGH (ref 70–99)

## 2020-06-05 MED ORDER — FLUDEOXYGLUCOSE F - 18 (FDG) INJECTION
6.1700 | Freq: Once | INTRAVENOUS | Status: AC | PRN
  Administered 2020-06-05: 6.17 via INTRAVENOUS

## 2020-06-08 ENCOUNTER — Telehealth: Payer: Self-pay | Admitting: Radiation Oncology

## 2020-06-08 NOTE — Telephone Encounter (Signed)
I called and spoke with the patient to make sure he knew his PET results. Dr. Lisbeth Renshaw still recommends SBRT, but wants to pay close attention to his nodal status following treatment since his PET showed lower level hypermetabolism in the mediastinal nodes. He is in agreement. He is also hoping to have some changes to his home O2 set up. I encouraged him to reach out to [facility] who supplies his O2.

## 2020-06-09 ENCOUNTER — Ambulatory Visit: Payer: Medicare Other | Admitting: Radiation Oncology

## 2020-06-09 DIAGNOSIS — M170 Bilateral primary osteoarthritis of knee: Secondary | ICD-10-CM | POA: Diagnosis not present

## 2020-06-09 DIAGNOSIS — M160 Bilateral primary osteoarthritis of hip: Secondary | ICD-10-CM | POA: Diagnosis not present

## 2020-06-09 DIAGNOSIS — J441 Chronic obstructive pulmonary disease with (acute) exacerbation: Secondary | ICD-10-CM | POA: Diagnosis not present

## 2020-06-09 DIAGNOSIS — G629 Polyneuropathy, unspecified: Secondary | ICD-10-CM | POA: Diagnosis not present

## 2020-06-09 DIAGNOSIS — J9621 Acute and chronic respiratory failure with hypoxia: Secondary | ICD-10-CM | POA: Diagnosis not present

## 2020-06-10 ENCOUNTER — Ambulatory Visit: Payer: Medicare Other | Admitting: Radiation Oncology

## 2020-06-10 ENCOUNTER — Telehealth: Payer: Self-pay | Admitting: Primary Care

## 2020-06-10 DIAGNOSIS — Z510 Encounter for antineoplastic radiation therapy: Secondary | ICD-10-CM | POA: Diagnosis not present

## 2020-06-10 DIAGNOSIS — C3411 Malignant neoplasm of upper lobe, right bronchus or lung: Secondary | ICD-10-CM | POA: Diagnosis not present

## 2020-06-10 DIAGNOSIS — J449 Chronic obstructive pulmonary disease, unspecified: Secondary | ICD-10-CM

## 2020-06-10 NOTE — Telephone Encounter (Signed)
No he does not need to do another walk/ we can place an order for 3L oxygen with DME Thanks

## 2020-06-10 NOTE — Telephone Encounter (Signed)
New order sent to DME, patient needs 3 liters oxygen on POC/OCD. New order placed.

## 2020-06-10 NOTE — Telephone Encounter (Signed)
DME requesting a new order for 3 liters pulse on POC/OCD. His qualifying walk says one liter but the DME evaluated him in his home and he needed 3 liters. Can triage rewrite the order for 3 liters? Does he have to do another walk in our office? Please advise.

## 2020-06-11 ENCOUNTER — Other Ambulatory Visit: Payer: Self-pay

## 2020-06-11 ENCOUNTER — Ambulatory Visit
Admission: RE | Admit: 2020-06-11 | Discharge: 2020-06-11 | Disposition: A | Discharge status: Home / Self Care | Payer: Medicare Other | Source: Ambulatory Visit | Attending: Radiation Oncology | Admitting: Radiation Oncology

## 2020-06-11 DIAGNOSIS — Z510 Encounter for antineoplastic radiation therapy: Secondary | ICD-10-CM | POA: Diagnosis not present

## 2020-06-11 DIAGNOSIS — C3411 Malignant neoplasm of upper lobe, right bronchus or lung: Secondary | ICD-10-CM | POA: Diagnosis not present

## 2020-06-16 ENCOUNTER — Other Ambulatory Visit: Payer: Self-pay

## 2020-06-16 ENCOUNTER — Ambulatory Visit
Admission: RE | Admit: 2020-06-16 | Discharge: 2020-06-16 | Disposition: A | Discharge status: Home / Self Care | Payer: Medicare Other | Source: Ambulatory Visit | Attending: Radiation Oncology | Admitting: Radiation Oncology

## 2020-06-16 DIAGNOSIS — Z510 Encounter for antineoplastic radiation therapy: Secondary | ICD-10-CM | POA: Diagnosis not present

## 2020-06-16 DIAGNOSIS — C3411 Malignant neoplasm of upper lobe, right bronchus or lung: Secondary | ICD-10-CM | POA: Diagnosis not present

## 2020-06-18 ENCOUNTER — Ambulatory Visit
Admission: RE | Admit: 2020-06-18 | Discharge: 2020-06-18 | Disposition: A | Discharge status: Home / Self Care | Payer: Medicare Other | Source: Ambulatory Visit | Attending: Radiation Oncology | Admitting: Radiation Oncology

## 2020-06-18 ENCOUNTER — Other Ambulatory Visit: Payer: Self-pay

## 2020-06-18 ENCOUNTER — Encounter: Payer: Self-pay | Admitting: Radiation Oncology

## 2020-06-18 DIAGNOSIS — C3411 Malignant neoplasm of upper lobe, right bronchus or lung: Secondary | ICD-10-CM

## 2020-06-18 DIAGNOSIS — Z510 Encounter for antineoplastic radiation therapy: Secondary | ICD-10-CM | POA: Diagnosis not present

## 2020-06-18 NOTE — Progress Notes (Signed)
Patient presents today after final SBRT treatment to right lung. Reports occasional fatigue first thing in the morning or with exertion. Denies any worsening SOB or coughing. Continues to have a productive cough with white to yellow colored sputum. Denies any issues swallowing (though if he does drink liquids too quickly he feels a lump in his throat). Denies any symptoms of reflux or esophagitis. Denies any skin changes to treatment field. Patient is not receiving any systemic therapy. He is curious to know when he will have repeat imaging to see how treatment field responded to radiation.

## 2020-06-18 NOTE — Progress Notes (Signed)
  Radiation Oncology         [phone]) [phone] ________________________________  Name: Lucas [name] MRN: [id number]  Date of Service: 06/18/2020  DOB: Nov 30, 1950  Post Treatment Telephone Note  Diagnosis:   Stage IA2, cT1bN0M0, NSCLC, Squamous Cell Carcinoma of the RUL.  Interval Since Last Radiation:  today  06/11/20-06/18/20 SBRT Treatment: The RUL target was treated to 54 Gy in 3 fractions  Narrative:  The patient was contacted today for routine follow-up. During treatment she did very well with radiotherapy and did not have significant desquamation. She reports he is doing fairly well. He continues with 3L O2 via [place] and reports he is curious about how well his treatment worked.   Impression/Plan: 1. Stage IA2, cT1bN0M0, NSCLC, Squamous Cell Carcinoma of the RUL.  I reviewed with the patient that we would plan to repeat a CT scan in 6 weeks time to determine his response.  He is aware of the process with scarring as this is an ablative therapy in the area that was previously treated.  We also reviewed the rationale for close follow-up evaluation afterwards as patients have high risks of synchronous lung cancers in the future.  He is in agreement and is counseled on precautions and to call us if he has any changes in his overall status. 2. COPD. The patient was encouraged to follow up with pulmonary medicine regarding his O2 needs and long term management of his COPD.    Carola Rhine, PAC

## 2020-06-19 DIAGNOSIS — J9621 Acute and chronic respiratory failure with hypoxia: Secondary | ICD-10-CM | POA: Diagnosis not present

## 2020-06-19 DIAGNOSIS — J441 Chronic obstructive pulmonary disease with (acute) exacerbation: Secondary | ICD-10-CM | POA: Diagnosis not present

## 2020-06-19 DIAGNOSIS — M160 Bilateral primary osteoarthritis of hip: Secondary | ICD-10-CM | POA: Diagnosis not present

## 2020-06-19 DIAGNOSIS — M170 Bilateral primary osteoarthritis of knee: Secondary | ICD-10-CM | POA: Diagnosis not present

## 2020-06-19 DIAGNOSIS — G629 Polyneuropathy, unspecified: Secondary | ICD-10-CM | POA: Diagnosis not present

## 2020-06-25 DIAGNOSIS — J90 Pleural effusion, not elsewhere classified: Secondary | ICD-10-CM | POA: Diagnosis not present

## 2020-06-25 DIAGNOSIS — J449 Chronic obstructive pulmonary disease, unspecified: Secondary | ICD-10-CM | POA: Diagnosis not present

## 2020-06-25 DIAGNOSIS — I2510 Atherosclerotic heart disease of native coronary artery without angina pectoris: Secondary | ICD-10-CM | POA: Diagnosis not present

## 2020-07-02 ENCOUNTER — Other Ambulatory Visit: Payer: Self-pay | Admitting: Physician Assistant

## 2020-07-08 NOTE — Progress Notes (Signed)
  Radiation Oncology         [phone] ________________________________  Name: Lucas [name] MRN: [id number]  Date: 06/18/2020  DOB: [birth year]  End of Treatment Note  Diagnosis:   stage I squamous cell cancer of the right upper lobe  Indication for treatment::  curative       Radiation treatment dates:   06/11/20 - 06/18/20  Site/dose:   The patient was treated to the right lung with a course of stereotactic body radiation treatment.  The patient received 54 Gray in 3 fractions using a SBRT/IMRT technique, with 3 fields.  Narrative: The patient tolerated radiation treatment relatively well.   No unexpected difficulties.  The patient's breathing did not significantly change during the course of the treatment.  Plan: The patient has completed radiation treatment. The patient will return to radiation oncology clinic for routine followup in one month. I advised the patient to call or return sooner if they have any questions or concerns related to their recovery or treatment. ________________________________  Jodelle Gross, M.D., Ph.D.

## 2020-07-16 ENCOUNTER — Other Ambulatory Visit: Payer: Self-pay | Admitting: Family Medicine

## 2020-07-18 ENCOUNTER — Other Ambulatory Visit: Payer: Self-pay | Admitting: Physician Assistant

## 2020-07-20 ENCOUNTER — Telehealth: Payer: Self-pay | Admitting: Radiation Oncology

## 2020-07-20 DIAGNOSIS — C3411 Malignant neoplasm of upper lobe, right bronchus or lung: Secondary | ICD-10-CM

## 2020-07-20 NOTE — Telephone Encounter (Signed)
  Radiation Oncology         [phone]) [phone] ________________________________  Name: Lucas [name] MRN: [id number]  Date of Service: 07/20/2020  DOB: 16-Mar-1951  Post Treatment Telephone Note  Diagnosis:  Stage IA2, cT1bN0M0, NSCLC, Squamous Cell Carcinoma of the RUL.  Interval Since Last Radiation:  5 weeks   06/11/20 - 06/18/20 SBRT Treatment: The patient was treated to the right lung with a course of stereotactic body radiation treatment.  The patient received 54 Gray in 3 fractions using a SBRT/IMRT technique, with 3 fields.  Narrative:  The patient was contacted today for routine follow-up. During treatment he did very well with radiotherapy and did not have significant desquamation. He reports he is doing great and reports no side effects from therapy.  Impression/Plan: 1. Stage IA2, cT1bN0M0, NSCLC, Squamous Cell Carcinoma of the RUL. The patient has been doing well since completion of radiotherapy. We discussed that we would plan on his CT scheduled for this Friday and I'll call him with these results. We would plan q 6 month scans per NCCN guidelines in surveillance and he is in agreement with this plan as well.    Carola Rhine, PAC

## 2020-07-22 ENCOUNTER — Telehealth: Payer: Self-pay | Admitting: *Deleted

## 2020-07-22 NOTE — Telephone Encounter (Signed)
Called patient to ask to come in for STAT labs on 07-24-20 @ 1:45 pm @ [facility] and then his sim to follow @ [facility], spoke with patient and he is aware of this lab and his CT.

## 2020-07-24 ENCOUNTER — Ambulatory Visit (HOSPITAL_COMMUNITY)
Admission: RE | Admit: 2020-07-24 | Discharge: 2020-07-24 | Disposition: A | Discharge status: Home / Self Care | Payer: Medicare Other | Source: Ambulatory Visit | Attending: Radiation Oncology | Admitting: Radiation Oncology

## 2020-07-24 ENCOUNTER — Other Ambulatory Visit: Payer: Self-pay

## 2020-07-24 ENCOUNTER — Ambulatory Visit
Admission: RE | Admit: 2020-07-24 | Discharge: 2020-07-24 | Disposition: A | Discharge status: Home / Self Care | Payer: Medicare Other | Source: Ambulatory Visit | Attending: Radiation Oncology | Admitting: Radiation Oncology

## 2020-07-24 DIAGNOSIS — C3411 Malignant neoplasm of upper lobe, right bronchus or lung: Secondary | ICD-10-CM

## 2020-07-24 DIAGNOSIS — I2510 Atherosclerotic heart disease of native coronary artery without angina pectoris: Secondary | ICD-10-CM | POA: Diagnosis not present

## 2020-07-24 DIAGNOSIS — I700 Atherosclerosis of aorta: Secondary | ICD-10-CM | POA: Diagnosis not present

## 2020-07-24 DIAGNOSIS — S2241XA Multiple fractures of ribs, right side, initial encounter for closed fracture: Secondary | ICD-10-CM | POA: Diagnosis not present

## 2020-07-24 DIAGNOSIS — C3490 Malignant neoplasm of unspecified part of unspecified bronchus or lung: Secondary | ICD-10-CM | POA: Diagnosis not present

## 2020-07-24 LAB — BUN & CREATININE (CHCC)
BUN: 4 mg/dL — ABNORMAL LOW (ref 8–23)
Creatinine: 0.76 mg/dL (ref 0.61–1.24)
GFR, Est AFR Am: >60 mL/min (ref >60)
GFR, Estimated: >60 mL/min (ref >60)

## 2020-07-24 MED ORDER — IOHEXOL 300 MG/ML  SOLN
75.0000 mL | Freq: Once | INTRAMUSCULAR | Status: AC | PRN
  Administered 2020-07-24: 75 mL via INTRAVENOUS

## 2020-07-25 DIAGNOSIS — J90 Pleural effusion, not elsewhere classified: Secondary | ICD-10-CM | POA: Diagnosis not present

## 2020-07-25 DIAGNOSIS — J449 Chronic obstructive pulmonary disease, unspecified: Secondary | ICD-10-CM | POA: Diagnosis not present

## 2020-07-25 DIAGNOSIS — I2510 Atherosclerotic heart disease of native coronary artery without angina pectoris: Secondary | ICD-10-CM | POA: Diagnosis not present

## 2020-07-27 ENCOUNTER — Ambulatory Visit
Admission: RE | Admit: 2020-07-27 | Discharge: 2020-07-27 | Disposition: A | Discharge status: Home / Self Care | Payer: Medicare Other | Source: Ambulatory Visit | Attending: Radiation Oncology | Admitting: Radiation Oncology

## 2020-07-27 ENCOUNTER — Ambulatory Visit: Payer: Self-pay | Admitting: Radiation Oncology

## 2020-07-27 VITALS — BP 140/83 | HR 102 | Temp 97.0°F | Resp 18 | Ht 71.0 in

## 2020-07-27 DIAGNOSIS — C3411 Malignant neoplasm of upper lobe, right bronchus or lung: Secondary | ICD-10-CM

## 2020-07-27 NOTE — Progress Notes (Signed)
Radiation Oncology         [id number]) [phone] ________________________________  Name: Lucas [name] MRN: [id number]  Date of Service: 07/27/2020  DOB: 02-06-1951  Post Treatment Telephone Note  Diagnosis:  Stage IA2, cT1bN0M0, NSCLC, Squamous Cell Carcinoma of the RUL.  Interval Since Last Radiation:  6 weeks   06/11/20 - 06/18/20 SBRT Treatment: The patient was treated to the right upper lobe lung with a course of stereotactic body radiation treatment.  The patient received 54 Gray in 3 fractions using a SBRT/IMRT technique, with 3 fields.  Narrative:  The patient was counseled to have a CT scan and that I would call him with these results.  The patient did end up coming in today in person for a visit to review his CT scan.  Fortunately, the scan from 07/24/2020 showed improvement in the treated lesion of the right upper lobe has improved in size and is now 9 x 8 mm compared to 13 mm prior to treatment.  He does have an upper normal appearance of his right hilar lymph node that had some previous hypermetabolism seen on PET CT scan but was felt to be indeterminate.  Recommendation was to follow these closely.  To summarize again his history, his lung nodule was found following his CABG.  On review of systems, the patient reports that he is doing well overall, he continues on 3 L of oxygen continuously, he states that he does have a chronic cough, and sometimes productive findings but denies any purulent or bloody phlegm.  He denies any fevers or chills.  He does report he has a hard time getting comfortable at nighttime and has some pain in the low chest/upper abdomen along the sites of where his prior drains were from his cardiac procedure and feels that that part of his body "caves in on him."  Some of the other features seen on his recent CT scan showed atherosclerotic changes, evidence of prior CABG, and multiple old rib fractures.   In general this is a thin appearing African-American male in no acute  distress.  He's alert and oriented x4 and appropriate throughout the examination. Cardiopulmonary assessment is negative for acute distress and he exhibits normal effort.  He is currently using 3 L of oxygen via nasal cannula.  Evaluation of his sternotomy incision is well healed and intact without separation or palpable anomaly.  With Valsalva I do not palpate any herniation of his abdominal wall at the site he points to being uncomfortable.   Impression/Plan: 1. Stage IA2, cT1bN0M0, NSCLC, Squamous Cell Carcinoma of the RUL. The patient has been doing well since completion of radiotherapy. We discussed that we would plan on his CT scheduled for this Friday and I'll call him with these results. We would plan q 6 month scans per NCCN guidelines in surveillance and he is in agreement with this plan as well. I reviewed the CT scan with the patient and his family.  We discussed the findings in the lymph node region, which are persistent since the PET scan, and I would recommend that we repeat another scan in 3 months time.  If everything is stable at that point we could move to 18-month imaging.  He is in agreement with this. 2. COPD.  I recommend that he follow-up with Dr. Valeta Harms routinely for management of this.  We will follow this expectantly as he continues on 3 L nasal cannula. 3. Tobacco cessation.  The patient continues to try and taper his use  of cigarettes, he is at about half a pack per day but prior to quitting had been up to 3 packs/day.  I encouraged him in trying to continue to taper as much as he is able to. 4. Prior CABG and symptoms of discomfort postoperatively.  I told the patient that I was not as familiar with management of the symptoms he notes.  I will reach out to Dr. Kipp Brood to see if this is a common occurrence and what is typically done to help symptoms.      Carola Rhine, PAC

## 2020-08-06 ENCOUNTER — Other Ambulatory Visit: Payer: Self-pay | Admitting: Physician Assistant

## 2020-08-18 ENCOUNTER — Ambulatory Visit: Payer: Medicare Other | Attending: Family

## 2020-08-18 DIAGNOSIS — Z23 Encounter for immunization: Secondary | ICD-10-CM

## 2020-08-25 DIAGNOSIS — J90 Pleural effusion, not elsewhere classified: Secondary | ICD-10-CM | POA: Diagnosis not present

## 2020-08-25 DIAGNOSIS — J449 Chronic obstructive pulmonary disease, unspecified: Secondary | ICD-10-CM | POA: Diagnosis not present

## 2020-08-25 DIAGNOSIS — I2510 Atherosclerotic heart disease of native coronary artery without angina pectoris: Secondary | ICD-10-CM | POA: Diagnosis not present

## 2020-09-06 ENCOUNTER — Emergency Department (HOSPITAL_COMMUNITY): Payer: Medicare Other

## 2020-09-06 ENCOUNTER — Encounter (HOSPITAL_COMMUNITY): Payer: Self-pay | Admitting: Emergency Medicine

## 2020-09-06 ENCOUNTER — Inpatient Hospital Stay (HOSPITAL_COMMUNITY)
Admission: EM | Admit: 2020-09-06 | Discharge: 2020-09-10 | DRG: 208 | Disposition: A | Discharge status: Home / Self Care | Payer: Medicare Other | Attending: Internal Medicine | Admitting: Internal Medicine

## 2020-09-06 ENCOUNTER — Other Ambulatory Visit: Payer: Self-pay

## 2020-09-06 DIAGNOSIS — E43 Unspecified severe protein-calorie malnutrition: Secondary | ICD-10-CM | POA: Diagnosis not present

## 2020-09-06 DIAGNOSIS — J302 Other seasonal allergic rhinitis: Secondary | ICD-10-CM | POA: Diagnosis present

## 2020-09-06 DIAGNOSIS — Z20822 Contact with and (suspected) exposure to covid-19: Secondary | ICD-10-CM | POA: Diagnosis present

## 2020-09-06 DIAGNOSIS — G629 Polyneuropathy, unspecified: Secondary | ICD-10-CM | POA: Diagnosis present

## 2020-09-06 DIAGNOSIS — R21 Rash and other nonspecific skin eruption: Secondary | ICD-10-CM | POA: Diagnosis not present

## 2020-09-06 DIAGNOSIS — Z951 Presence of aortocoronary bypass graft: Secondary | ICD-10-CM

## 2020-09-06 DIAGNOSIS — R7401 Elevation of levels of liver transaminase levels: Secondary | ICD-10-CM | POA: Diagnosis present

## 2020-09-06 DIAGNOSIS — J441 Chronic obstructive pulmonary disease with (acute) exacerbation: Principal | ICD-10-CM | POA: Diagnosis present

## 2020-09-06 DIAGNOSIS — I252 Old myocardial infarction: Secondary | ICD-10-CM

## 2020-09-06 DIAGNOSIS — E872 Acidosis: Secondary | ICD-10-CM | POA: Diagnosis not present

## 2020-09-06 DIAGNOSIS — R5381 Other malaise: Secondary | ICD-10-CM | POA: Diagnosis present

## 2020-09-06 DIAGNOSIS — Z885 Allergy status to narcotic agent status: Secondary | ICD-10-CM

## 2020-09-06 DIAGNOSIS — J9602 Acute respiratory failure with hypercapnia: Secondary | ICD-10-CM

## 2020-09-06 DIAGNOSIS — C3430 Malignant neoplasm of lower lobe, unspecified bronchus or lung: Secondary | ICD-10-CM | POA: Diagnosis not present

## 2020-09-06 DIAGNOSIS — I10 Essential (primary) hypertension: Secondary | ICD-10-CM | POA: Diagnosis not present

## 2020-09-06 DIAGNOSIS — Z7982 Long term (current) use of aspirin: Secondary | ICD-10-CM | POA: Diagnosis not present

## 2020-09-06 DIAGNOSIS — C3411 Malignant neoplasm of upper lobe, right bronchus or lung: Secondary | ICD-10-CM | POA: Diagnosis present

## 2020-09-06 DIAGNOSIS — J9622 Acute and chronic respiratory failure with hypercapnia: Secondary | ICD-10-CM | POA: Diagnosis present

## 2020-09-06 DIAGNOSIS — Z681 Body mass index (BMI) 19 or less, adult: Secondary | ICD-10-CM

## 2020-09-06 DIAGNOSIS — J9621 Acute and chronic respiratory failure with hypoxia: Secondary | ICD-10-CM | POA: Diagnosis present

## 2020-09-06 DIAGNOSIS — Z79899 Other long term (current) drug therapy: Secondary | ICD-10-CM | POA: Diagnosis not present

## 2020-09-06 DIAGNOSIS — Z716 Tobacco abuse counseling: Secondary | ICD-10-CM

## 2020-09-06 DIAGNOSIS — Z9981 Dependence on supplemental oxygen: Secondary | ICD-10-CM | POA: Diagnosis not present

## 2020-09-06 DIAGNOSIS — K7460 Unspecified cirrhosis of liver: Secondary | ICD-10-CM | POA: Diagnosis present

## 2020-09-06 DIAGNOSIS — E785 Hyperlipidemia, unspecified: Secondary | ICD-10-CM | POA: Diagnosis not present

## 2020-09-06 DIAGNOSIS — R0602 Shortness of breath: Secondary | ICD-10-CM | POA: Diagnosis not present

## 2020-09-06 DIAGNOSIS — I2510 Atherosclerotic heart disease of native coronary artery without angina pectoris: Secondary | ICD-10-CM | POA: Diagnosis not present

## 2020-09-06 DIAGNOSIS — F17210 Nicotine dependence, cigarettes, uncomplicated: Secondary | ICD-10-CM | POA: Diagnosis present

## 2020-09-06 DIAGNOSIS — J90 Pleural effusion, not elsewhere classified: Secondary | ICD-10-CM | POA: Diagnosis not present

## 2020-09-06 DIAGNOSIS — R61 Generalized hyperhidrosis: Secondary | ICD-10-CM | POA: Diagnosis not present

## 2020-09-06 DIAGNOSIS — R0689 Other abnormalities of breathing: Secondary | ICD-10-CM | POA: Diagnosis not present

## 2020-09-06 DIAGNOSIS — Z888 Allergy status to other drugs, medicaments and biological substances status: Secondary | ICD-10-CM

## 2020-09-06 DIAGNOSIS — Z881 Allergy status to other antibiotic agents status: Secondary | ICD-10-CM

## 2020-09-06 DIAGNOSIS — J9601 Acute respiratory failure with hypoxia: Secondary | ICD-10-CM

## 2020-09-06 DIAGNOSIS — Z880 Allergy status to penicillin: Secondary | ICD-10-CM

## 2020-09-06 DIAGNOSIS — J439 Emphysema, unspecified: Secondary | ICD-10-CM | POA: Diagnosis not present

## 2020-09-06 DIAGNOSIS — S2231XA Fracture of one rib, right side, initial encounter for closed fracture: Secondary | ICD-10-CM | POA: Diagnosis not present

## 2020-09-06 DIAGNOSIS — Z825 Family history of asthma and other chronic lower respiratory diseases: Secondary | ICD-10-CM

## 2020-09-06 LAB — CBC
HCT: 45.1 % (ref 39.0–52.0)
Hemoglobin: 14.2 g/dL (ref 13.0–17.0)
MCH: 31.6 pg (ref 26.0–34.0)
MCHC: 31.5 g/dL (ref 30.0–36.0)
MCV: 100.2 fL — ABNORMAL HIGH (ref 80.0–100.0)
Platelets: 168 10*3/uL (ref 150–400)
RBC: 4.5 MIL/uL (ref 4.22–5.81)
RDW: 13.5 % (ref 11.5–15.5)
WBC: 6.7 10*3/uL (ref 4.0–10.5)
nRBC: 0 % (ref 0.0–0.2)

## 2020-09-06 LAB — I-STAT ARTERIAL BLOOD GAS, ED
Acid-base deficit: 8 mmol/L — ABNORMAL HIGH (ref 0.0–2.0)
Bicarbonate: 22.8 mmol/L (ref 20.0–28.0)
Calcium, Ion: 1.29 mmol/L (ref 1.15–1.40)
HCT: 43 % (ref 39.0–52.0)
Hemoglobin: 14.6 g/dL (ref 13.0–17.0)
O2 Saturation: 99 %
Patient temperature: 95.7
Potassium: 4.2 mmol/L (ref 3.5–5.1)
Sodium: 138 mmol/L (ref 135–145)
TCO2: 25 mmol/L (ref 22–32)
pCO2 arterial: 63 mmHg — ABNORMAL HIGH (ref 32.0–48.0)
pH, Arterial: 7.157 — CL (ref 7.350–7.450)
pO2, Arterial: 206 mmHg — ABNORMAL HIGH (ref 83.0–108.0)

## 2020-09-06 LAB — COMPREHENSIVE METABOLIC PANEL
ALT: 26 U/L (ref 0–44)
AST: 82 U/L — ABNORMAL HIGH (ref 15–41)
Albumin: 4.2 g/dL (ref 3.5–5.0)
Alkaline Phosphatase: 97 U/L (ref 38–126)
Anion gap: 10 (ref 5–15)
BUN: 11 mg/dL (ref 8–23)
CO2: 26 mmol/L (ref 22–32)
Calcium: 9.7 mg/dL (ref 8.9–10.3)
Chloride: 103 mmol/L (ref 98–111)
Creatinine, Ser: 0.83 mg/dL (ref 0.61–1.24)
GFR, Estimated: >60 mL/min (ref >60)
Glucose, Bld: 177 mg/dL — ABNORMAL HIGH (ref 70–99)
Potassium: 5.8 mmol/L — ABNORMAL HIGH (ref 3.5–5.1)
Sodium: 139 mmol/L (ref 135–145)
Total Bilirubin: 1.5 mg/dL — ABNORMAL HIGH (ref 0.3–1.2)
Total Protein: 7.9 g/dL (ref 6.5–8.1)

## 2020-09-06 LAB — BRAIN NATRIURETIC PEPTIDE: B Natriuretic Peptide: 24 pg/mL (ref 0.0–100.0)

## 2020-09-06 LAB — TROPONIN I (HIGH SENSITIVITY): Troponin I (High Sensitivity): 15 ng/L (ref <18)

## 2020-09-06 MED ORDER — NITROGLYCERIN IN D5W 200-5 MCG/ML-% IV SOLN
0.0000 ug/min | INTRAVENOUS | Status: DC
  Filled 2020-09-06: qty 250

## 2020-09-06 MED ORDER — SODIUM CHLORIDE 0.9 % IV BOLUS
1000.0000 mL | Freq: Once | INTRAVENOUS | Status: AC
  Administered 2020-09-06: 1000 mL via INTRAVENOUS

## 2020-09-06 MED ORDER — ETOMIDATE 2 MG/ML IV SOLN
INTRAVENOUS | Status: AC | PRN
  Administered 2020-09-06: 20 mg via INTRAVENOUS

## 2020-09-06 MED ORDER — SUCCINYLCHOLINE CHLORIDE 20 MG/ML IJ SOLN
INTRAMUSCULAR | Status: AC | PRN
  Administered 2020-09-06: 150 mg via INTRAVENOUS

## 2020-09-06 MED ORDER — ALBUTEROL (5 MG/ML) CONTINUOUS INHALATION SOLN
10.0000 mg/h | INHALATION_SOLUTION | RESPIRATORY_TRACT | Status: DC
  Administered 2020-09-06: 10 mg/h via RESPIRATORY_TRACT
  Filled 2020-09-06: qty 20

## 2020-09-06 MED ORDER — SODIUM CHLORIDE 0.9 % IV BOLUS
1000.0000 mL | Freq: Once | INTRAVENOUS | Status: AC
  Administered 2020-09-07: 1000 mL via INTRAVENOUS

## 2020-09-06 MED ORDER — SODIUM CHLORIDE 0.9 % IV SOLN
1.0000 g | Freq: Once | INTRAVENOUS | Status: AC
  Administered 2020-09-07: 1 g via INTRAVENOUS
  Filled 2020-09-06: qty 10

## 2020-09-06 MED ORDER — IPRATROPIUM BROMIDE 0.02 % IN SOLN
0.5000 mg | Freq: Once | RESPIRATORY_TRACT | Status: AC
  Administered 2020-09-06: 0.5 mg via RESPIRATORY_TRACT
  Filled 2020-09-06: qty 2.5

## 2020-09-06 MED ORDER — NITROGLYCERIN 0.4 MG SL SUBL
0.4000 mg | SUBLINGUAL_TABLET | SUBLINGUAL | Status: DC | PRN
  Administered 2020-09-06: 0.4 mg via SUBLINGUAL

## 2020-09-06 MED ORDER — IPRATROPIUM BROMIDE 0.02 % IN SOLN: 0.5000 mg | Freq: Once | RESPIRATORY_TRACT | Status: DC

## 2020-09-06 MED ORDER — SODIUM CHLORIDE 0.9 % IV SOLN
500.0000 mg | Freq: Once | INTRAVENOUS | Status: AC
  Administered 2020-09-07: 500 mg via INTRAVENOUS
  Filled 2020-09-06: qty 500

## 2020-09-06 MED ORDER — ALBUTEROL (5 MG/ML) CONTINUOUS INHALATION SOLN
10.0000 mg/h | INHALATION_SOLUTION | RESPIRATORY_TRACT | Status: DC
  Filled 2020-09-06: qty 20

## 2020-09-06 MED ORDER — SODIUM CHLORIDE 0.9 % IV SOLN: INTRAVENOUS | Status: DC

## 2020-09-06 MED ORDER — PROPOFOL 1000 MG/100ML IV EMUL
0.0000 ug/kg/min | INTRAVENOUS | Status: DC
  Administered 2020-09-07: 45 ug/kg/min via INTRAVENOUS
  Filled 2020-09-06: qty 100

## 2020-09-06 MED ORDER — METHYLPREDNISOLONE SODIUM SUCC 125 MG IJ SOLR
125.0000 mg | Freq: Once | INTRAMUSCULAR | Status: DC
  Filled 2020-09-06: qty 2

## 2020-09-06 MED ORDER — PROPOFOL 1000 MG/100ML IV EMUL
INTRAVENOUS | Status: AC
  Administered 2020-09-06: 15 ug/kg/min via INTRAVENOUS
  Filled 2020-09-06: qty 100

## 2020-09-06 NOTE — Progress Notes (Signed)
Rt placed pt on bipap but pt shortly failed and needed to be intubated.

## 2020-09-06 NOTE — ED Provider Notes (Signed)
  Procedures Procedure Name: Intubation Date/Time: 09/06/2020 10:32 PM Performed by: Roosevelt Locks, MD Pre-anesthesia Checklist: Patient identified, Suction available and Patient being monitored Preoxygenation: Pre-oxygenation with 100% oxygen Induction Type: Rapid sequence Laryngoscope Size: Mac and 3 Grade View: Grade I Tube size: 7.5 mm Number of attempts: 1 Airway Equipment and Method: Video-laryngoscopy Placement Confirmation: ETT inserted through vocal cords under direct vision,  Positive ETCO2,  CO2 detector and Breath sounds checked- equal and bilateral Secured at: 25 cm Tube secured with: ETT holder         Roosevelt Locks, MD 09/06/20 [id number]    Lajean Saver, MD 09/06/20 [id number]

## 2020-09-06 NOTE — Sedation Documentation (Signed)
ET tube placed, color change noted, breath sounds equal and bilaterally.

## 2020-09-06 NOTE — ED Notes (Signed)
Warm blankets applied to patient.  

## 2020-09-06 NOTE — ED Triage Notes (Signed)
Pt BIB [facility] from home, c/o sudden onset shortness of breath. Hx COPD and lung cancer. Pt given 2g mag, 125mg  solumedrol, duo neb x 2. Per EMS, pt became diaphoretic and had increased work of breathing.

## 2020-09-06 NOTE — Sedation Documentation (Signed)
Xray at bedside for port chest

## 2020-09-06 NOTE — ED Provider Notes (Signed)
[facility] EMERGENCY DEPARTMENT Provider Note   CSN: [id number] Arrival date & time: 09/06/20  2205     History Chief Complaint  Patient presents with  . Shortness of Breath    Lucas [name] is a 69 y.o. male.  Patient with hx copd, lung cancer, arrives via EMS in acute respiratory distress. Pt unable to answer most questions due to severe respiratory distress - level 5 caveat.  EMS notes BP high, and diffuse wheezing with severely diminished air movement bilaterally.   The history is provided by the patient and the EMS personnel. The history is limited by the condition of the patient.  Shortness of Breath      Past Medical History:  Diagnosis Date  . Acute blood loss anemia 06/21/2018  . Acute upper GI bleed 06/21/2018  . Allergy   . Bilateral AVN of femurs ([place]) 06/25/2018   See 11/2017 Chest CT report  . COPD 12/21/2006  . Environmental allergies   . History of femur fracture 06/25/2018   11/2017 CT AP showed Remote nonunited right femur greater trochanter fracture  . History of Fracture of right tibia 06/26/2018  . History of multiple right thoacic rib fractures 06/25/2018  . History of Right clavicular fracture 06/25/2018  . HYPERLIPIDEMIA 12/21/2006  . HYPERTENSION, BENIGN SYSTEMIC 12/21/2006  . Mallory-Weiss tear   . TOBACCO DEPENDENCE 12/21/2006    Patient Active Problem List   Diagnosis Date Noted  . Malignant neoplasm of right upper lobe of lung ([place]) 05/26/2020  . Lung mass 05/04/2020  . Acute on chronic respiratory failure with hypoxia ([place]) 05/02/2020  . Pulmonary nodule 02/10/2020  . Pleural effusion, right   . S/P CABG x 4 12/16/2019  . Coronary artery disease 12/16/2019  . Unstable angina ([place]) 12/12/2019  . NSTEMI (non-ST elevated myocardial infarction) ([place]) 12/12/2019  . CAD (coronary artery disease) 07/11/2018  . H. pylori infection 07/03/2018  . Chronic stable angina ([place]) 07/03/2018  . Duodenal ulcer due to Helicobacter pylori   .  Bilateral AVN of femurs ([place]) 06/25/2018  . Chronic cough 06/25/2018  . Mallory-Weiss tear   . Gastritis and gastroduodenitis   . Cirrhosis of liver ([place])   . Tubular adenoma of colon 01/24/2017  . Epistaxis 01/01/2015  . COPD exacerbation ([place]) 02/13/2013  . Ganglion cyst 01/03/2012  . PERIPHERAL NEUROPATHY 07/15/2010  . Alcohol use disorder, severe, dependence ([place]) 05/14/2009  . DEFORMITY, FOOT NEC, CONGENITAL 08/22/2007  . ALLERGIC RHINITIS, SEASONAL 05/03/2007  . Hyperlipidemia 12/21/2006  . Tobacco abuse 12/21/2006  . COPD with chronic bronchitis ([place]) 12/21/2006    Past Surgical History:  Procedure Laterality Date  . ANKLE SURGERY     left ankle  . APPLICATION OF WOUND VAC  01/06/2012   Procedure: APPLICATION OF WOUND VAC;  Surgeon: Meredith Pel, MD;  Location: [facility];  Service: Orthopedics;  Laterality: Left;  . APPLICATION OF WOUND VAC Right 10/17/2015   Procedure: APPLICATION OF WOUND VAC;  Surgeon: Leandrew Koyanagi, MD;  Location: [facility];  Service: Orthopedics;  Laterality: Right;  . BIOPSY  06/22/2018   Procedure: BIOPSY;  Surgeon: [id number] v, DO;  Location: [facility];  Service: Gastroenterology;;  . BRONCHIAL BIOPSY  05/04/2020   Procedure: BRONCHIAL BIOPSIES;  Surgeon: Garner Nash, DO;  Location: [facility];  Service: Pulmonary;;  . BRONCHIAL BRUSHINGS  05/04/2020   Procedure: BRONCHIAL BRUSHINGS;  Surgeon: Garner Nash, DO;  Location: [place];  Service: Pulmonary;;  . BRONCHIAL NEEDLE ASPIRATION BIOPSY  05/04/2020  Procedure: BRONCHIAL NEEDLE ASPIRATION BIOPSIES;  Surgeon: Garner Nash, DO;  Location: [place];  Service: Pulmonary;;  . BRONCHIAL WASHINGS  05/04/2020   Procedure: BRONCHIAL WASHINGS;  Surgeon: Garner Nash, DO;  Location: [place];  Service: Pulmonary;;  . CORONARY ARTERY BYPASS GRAFT N/A 12/16/2019   Procedure: CORONARY ARTERY BYPASS GRAFTING (CABG) TIMES 4 USING LEFT INTERNAL MAMMARY ARTERY AND ENDOSCOPICALLY  HARVESTED RIGHT SAPHENOUS VEIN;  Surgeon: Lajuana Matte, MD;  Location: [place];  Service: Open Heart Surgery;  Laterality: N/A;  . ESOPHAGOGASTRODUODENOSCOPY (EGD) WITH PROPOFOL N/A 06/22/2018   Procedure: ESOPHAGOGASTRODUODENOSCOPY (EGD) WITH PROPOFOL;  Surgeon: [id number] v, DO;  Location: [facility];  Service: Gastroenterology;  Laterality: N/A;  . EXTERNAL FIXATION LEG  01/06/2012   Procedure: EXTERNAL FIXATION LEG;  Surgeon: Meredith Pel, MD;  [facility];  Service: Orthopedics;  Laterality: Left;  Smith-nephew external fixator set  . EXTERNAL FIXATION LEG Right 09/16/2015   Procedure: EXTERNAL FIXATION LEG/LARGE;  Surgeon: Mcarthur Rossetti, MD;  Location: [place];  Service: Orthopedics;  Laterality: Right;  . EXTERNAL FIXATION REMOVAL Right 09/22/2015   Procedure: REMOVAL EXTERNAL FIXATION LEG;  Surgeon: Mcarthur Rossetti, MD;  Location: [place];  Service: Orthopedics;  Laterality: Right;  . FASCIOTOMY  01/10/2012   Procedure: FASCIOTOMY;  Surgeon: Rozanna Box, MD;  Location: [place];  Service: Orthopedics;  Laterality: Left;  status post fasciotomy with wound vac left calf; adjustment external fixator left tibia under fluoro; retention suture/wound vac placement left lateral calf wound  . FASCIOTOMY  01/06/2012   Procedure: FASCIOTOMY;  Surgeon: Meredith Pel, MD;  [facility];  Service: Orthopedics;  Laterality: Left;  Four compartment fasciotomy  . FIDUCIAL MARKER PLACEMENT  05/04/2020   Procedure: FIDUCIAL MARKER PLACEMENT;  Surgeon: Garner Nash, DO;  Location: [place];  Service: Pulmonary;;  . HOT HEMOSTASIS N/A 06/22/2018   Procedure: HEMOSTASIS;  Surgeon: Dorothea Ogle v, DO;  Location: [place];  Service: Gastroenterology;  Laterality: N/A;  Epinepherine and clip placed  . I & D EXTREMITY  01/12/2012   Procedure: IRRIGATION AND DEBRIDEMENT EXTREMITY;  Surgeon: Rozanna Box, MD;  Location: [place];  Service: Orthopedics;   Laterality: Left;  LAYERD CLOSURE LEFT LEG WOUND  . I & D EXTREMITY Right 10/17/2015   Procedure: IRRIGATION AND DEBRIDEMENT EXTREMITY;  Surgeon: Leandrew Koyanagi, MD;  Location: [place];  Service: Orthopedics;  Laterality: Right;  . I & D EXTREMITY Right 10/20/2015   Procedure: REPEAT IRRIGATION AND DEBRIDEMENT EXTREMITY, right lower leg;  Surgeon: Mcarthur Rossetti, MD;  Location: [place];  Service: Orthopedics;  Laterality: Right;  . KNEE SURGERY     left knee and right knee  . LEFT HEART CATH AND CORONARY ANGIOGRAPHY N/A 06/27/2018   Procedure: LEFT HEART CATH AND CORONARY ANGIOGRAPHY;  Surgeon: Martinique, Peter M, MD;  Location: [facility];  Service: Cardiovascular;  Laterality: N/A;  . LEFT HEART CATH AND CORONARY ANGIOGRAPHY N/A 12/12/2019   Procedure: LEFT HEART CATH AND CORONARY ANGIOGRAPHY;  Surgeon: Wellington Hampshire, MD;  Location: [place] [facility];  Service: Cardiovascular;  Laterality: N/A;  . MASS EXCISION Left 09/22/2015   Procedure: EXCISION MASS;  Surgeon: Mcarthur Rossetti, MD;  Location: [place];  Service: Orthopedics;  Laterality: Left;  . ORIF TIBIA PLATEAU  02/07/2012   Procedure: OPEN REDUCTION INTERNAL FIXATION (ORIF) TIBIAL PLATEAU;  Surgeon: Rozanna Box, MD;  Location: [place];  Service: Orthopedics;  Laterality: Left;  ORIF LEFT TIBIAL  PLATEAU/ REMOVE EXTERNAL FIXATION LEFT LEG  . ORIF TIBIA PLATEAU Right 09/22/2015   Procedure: REMOVAL OF EXTERNAL FIXATOR RIGHT LEG, OPEN REDUCTION INTERNAL FIXATION (ORIF) RIGHT TIBIAL PLATEAU, EXCISION SMALL MASS LEFT LEG;  Surgeon: Mcarthur Rossetti, MD;  Location: [place];  Service: Orthopedics;  Laterality: Right;  . SKIN SPLIT GRAFT Right 10/23/2015   Procedure: RIGHT GASTROC FLAP SPLIT THICKNESS SKIN GRAFT FROM RIGHT THING TO RIGHT LEG;  Surgeon: Irene Limbo, MD;  Location: [place];  Service: Plastics;  Laterality: Right;  . TEE WITHOUT CARDIOVERSION N/A 12/16/2019   Procedure: TRANSESOPHAGEAL ECHOCARDIOGRAM (TEE);   Surgeon: Lajuana Matte, MD;  Location: [place];  Service: Open Heart Surgery;  Laterality: N/A;  . VIDEO BRONCHOSCOPY WITH ENDOBRONCHIAL NAVIGATION Right 05/04/2020   Procedure: VIDEO BRONCHOSCOPY WITH ENDOBRONCHIAL NAVIGATION;  Surgeon: Garner Nash, DO;  Location: [place];  Service: Pulmonary;  Laterality: Right;  . WRIST SURGERY     left       Family History  Problem Relation Age of Onset  . Emphysema Father     Social History   Tobacco Use  . Smoking status: Current Every Day Smoker    Packs/day: 0.50    Years: 51.00    Pack years: 25.50    Types: Cigarettes  . Smokeless tobacco: Former Systems developer    Types: Snuff, Chew    Quit date: 01/05/1965  . Tobacco comment: currently half a pack a day-05/26/2020  Vaping Use  . Vaping Use: Never used  Substance Use Topics  . Alcohol use: Yes    Alcohol/week: 6.0 standard drinks    Types: 6 Cans of beer per week    Comment: drinks about a 6-pack/day  . Drug use: No    Home Medications Prior to Admission medications   Medication Sig Start Date End Date Taking? Authorizing Provider  albuterol (PROAIR HFA) 108 (90 Base) MCG/ACT inhaler INHALE 2 PUFFS BY MOUTH EVERY 4 HOURS AS NEEDED FOR WHEEZE OR FOR SHORTNESS OF BREATH Patient taking differently: Inhale 2 puffs into the lungs every 4 (four) hours as needed for wheezing or shortness of breath.  02/08/20   Brimage, Ronnette Juniper, DO  ANORO ELLIPTA 62.5-25 MCG/INH AEPB TAKE 1 PUFF BY MOUTH EVERY DAY 07/16/20   [name], DO  aspirin EC 81 MG tablet Take 81 mg by mouth daily.  Patient not taking: Reported on 05/26/2020    [provider]  bisacodyl (BISACODYL) 5 MG EC tablet Take 15 mg by mouth daily as needed for moderate constipation. Patient not taking: Reported on 05/26/2020    [provider]  fluticasone (FLONASE) 50 MCG/ACT nasal spray SPRAY 2 SPRAYS INTO EACH NOSTRIL EVERY DAY Patient taking differently: Place 1 spray into both nostrils daily as needed for  allergies.  07/19/18   Steve Rattler, DO  losartan (COZAAR) 25 MG tablet Take 0.5 tablets (12.5 mg total) by mouth daily. Patient not taking: Reported on 05/26/2020 12/23/19   Barrett, Junie Panning R, PA-C  metoprolol tartrate (LOPRESSOR) 25 MG tablet TAKE 1.5 TABLETS (37.5 MG TOTAL) BY MOUTH 2 (TWO) TIMES DAILY. Patient not taking: Reported on 05/26/2020 12/27/19   Lajuana Matte, MD  nitroGLYCERIN (NITROSTAT) 0.4 MG SL tablet PLACE 1 TABLET (0.4 MG TOTAL) UNDER THE TONGUE EVERY 5 (FIVE) MINUTES AS NEEDED FOR CHEST PAIN. 03/02/20   Brimage, Ronnette Juniper, DO  OXYGEN Inhale 3-4 L into the lungs See admin instructions. Continuous at night, as needed during the day    [provider]  oxymetazoline (  AFRIN) 0.05 % nasal spray Place 1 spray into both nostrils 2 (two) times daily as needed for congestion (nose bleeds).     [provider]  rosuvastatin (CRESTOR) 40 MG tablet Take 1 tablet (40 mg total) by mouth daily. Patient not taking: Reported on 05/26/2020 02/08/20   [name], DO  thiamine 100 MG tablet Take 1 tablet (100 mg total) by mouth daily. Patient not taking: Reported on 05/26/2020 05/06/20   Patriciaann Clan, DO  loratadine (CLARITIN) 10 MG tablet Take 1 tablet (10 mg total) by mouth daily. 03/04/11 05/23/18  Mariana Arn, MD  metoprolol tartrate (LOPRESSOR) 25 MG tablet Take 1 tablet (25 mg total) by mouth 2 (two) times daily. 05/29/19   [name], DO    Allergies    Ace inhibitors, Ampicillin, Penicillins, Codeine, and Lisinopril  Review of Systems   Review of Systems  Unable to perform ROS: Severe respiratory distress  Respiratory: Positive for shortness of breath.   severe resp distress - level 5 caveat.     Physical Exam Updated Vital Signs BP (!) 190/112 (BP Location: Right Arm)   Pulse (!) 148   Resp (!) 23   SpO2 100%   Physical Exam Vitals and nursing note reviewed.  Constitutional:      General: He is in acute distress.     Appearance: He is  well-developed.     Comments: Awake, severe resp distress.  HENT:     Head: Atraumatic.     Nose: Nose normal.     Mouth/Throat:     Mouth: Mucous membranes are moist.     Pharynx: Oropharynx is clear.  Eyes:     General: No scleral icterus.    Conjunctiva/sclera: Conjunctivae normal.     Pupils: Pupils are equal, round, and reactive to light.  Neck:     Trachea: No tracheal deviation.  Cardiovascular:     Rate and Rhythm: Regular rhythm. Tachycardia present.     Pulses: Normal pulses.     Heart sounds: Normal heart sounds. No murmur heard.  No friction rub. No gallop.   Pulmonary:     Effort: Respiratory distress present. No accessory muscle usage.     Breath sounds: Wheezing present.     Comments: Diminished air movement bil.  Abdominal:     General: Bowel sounds are normal. There is no distension.     Palpations: Abdomen is soft.     Tenderness: There is no abdominal tenderness. There is no guarding.  Genitourinary:    Comments: No cva tenderness. Musculoskeletal:        General: No swelling.     Cervical back: Normal range of motion and neck supple. No rigidity.  Skin:    General: Skin is warm and dry.     Findings: No rash.  Neurological:     Comments: Awake. resp distress. Moving bil extremities purposefully.  Psychiatric:        Mood and Affect: Mood normal.     ED Results / Procedures / Treatments   Labs (all labs ordered are listed, but only abnormal results are displayed) Results for orders placed or performed during the hospital encounter of 09/06/20  CBC  Result Value Ref Range   WBC 6.7 4.0 - 10.5 K/uL   RBC 4.50 4.22 - 5.81 MIL/uL   Hemoglobin 14.2 13.0 - 17.0 g/dL   HCT 45.1 39 - 52 %   MCV 100.2 (H) 80.0 - 100.0 fL   MCH 31.6 26.0 - 34.0  pg   MCHC 31.5 30.0 - 36.0 g/dL   RDW 13.5 11.5 - 15.5 %   Platelets 168 150 - 400 K/uL   nRBC 0.0 0.0 - 0.2 %  Comprehensive metabolic panel  Result Value Ref Range   Sodium 139 135 - 145 mmol/L    Potassium 5.8 (H) 3.5 - 5.1 mmol/L   Chloride 103 98 - 111 mmol/L   CO2 26 22 - 32 mmol/L   Glucose, Bld 177 (H) 70 - 99 mg/dL   BUN 11 8 - 23 mg/dL   Creatinine, Ser 0.83 0.61 - 1.24 mg/dL   Calcium 9.7 8.9 - 10.3 mg/dL   Total Protein 7.9 6.5 - 8.1 g/dL   Albumin 4.2 3.5 - 5.0 g/dL   AST 82 (H) 15 - 41 U/L   ALT 26 0 - 44 U/L   Alkaline Phosphatase 97 38 - 126 U/L   Total Bilirubin 1.5 (H) 0.3 - 1.2 mg/dL   GFR, Estimated >60 >60 mL/min   Anion gap 10 5 - 15  Brain natriuretic peptide  Result Value Ref Range   B Natriuretic Peptide 24.0 0.0 - 100.0 pg/mL  I-Stat arterial blood gas, ED  Result Value Ref Range   pH, Arterial 7.157 (LL) 7.35 - 7.45   pCO2 arterial 63.0 (H) 32 - 48 mmHg   pO2, Arterial 206 (H) 83 - 108 mmHg   Bicarbonate 22.8 20.0 - 28.0 mmol/L   TCO2 25 22 - 32 mmol/L   O2 Saturation 99.0 %   Acid-base deficit 8.0 (H) 0.0 - 2.0 mmol/L   Sodium 138 135 - 145 mmol/L   Potassium 4.2 3.5 - 5.1 mmol/L   Calcium, Ion 1.29 1.15 - 1.40 mmol/L   HCT 43.0 39 - 52 %   Hemoglobin 14.6 13.0 - 17.0 g/dL   Patient temperature 95.7 F    Collection site Radial    Drawn by RT    Sample type ARTERIAL    Comment NOTIFIED PHYSICIAN   Troponin I (High Sensitivity)  Result Value Ref Range   Troponin I (High Sensitivity) 15 <18 ng/L   DG Chest Port 1 View  Result Date: 09/06/2020 CLINICAL DATA:  69 year old male with respiratory distress. EXAM: PORTABLE CHEST 1 VIEW COMPARISON:  Chest radiograph dated 05/04/2020 and CT dated 07/24/2020. FINDINGS: Endotracheal tube with suboptimally visualized tip due to overlying sternal wires but appears above the carina. Enteric tube extends below the diaphragm with side-port just distal to the GE junction and tip beyond the inferior margin of the image. There is background of emphysema. There is flattening of the diaphragms with blunting of the costophrenic angles which may be related to hyperexpansion or scarring. Small pleural effusions are  not excluded clinical correlation is recommended. Right upper lobe interstitial coarsening and nodularity may be chronic although developing infiltrate is not excluded. No pneumothorax identified. The cardiac silhouette is within limits. Median sternotomy wires and CABG vascular clips. Old healed right rib fracture deformities. No acute osseous pathology. IMPRESSION: 1. Endotracheal tube appears above the carina. 2. Emphysema. 3. Right upper lobe interstitial coarsening and nodularity may be chronic although developing infiltrate is not excluded. Electronically Signed   By: Anner Crete M.D.   On: 09/06/2020 22:52    EKG EKG Interpretation  Date/Time:  Sunday September 06 2020 22:36:16 EST Ventricular Rate:  99 PR Interval:    QRS Duration: 93 QT Interval:  305 QTC Calculation: 392 R Axis:   84 Text Interpretation: Atrial fibrillation LVH with secondary  repolarization abnormality Non-specific ST-t changes Confirmed by Lajean Saver [phone]) on 09/06/2020 10:55:39 PM   Radiology DG Chest Port 1 View  Result Date: 09/06/2020 CLINICAL DATA:  69 year old male with respiratory distress. EXAM: PORTABLE CHEST 1 VIEW COMPARISON:  Chest radiograph dated 05/04/2020 and CT dated 07/24/2020. FINDINGS: Endotracheal tube with suboptimally visualized tip due to overlying sternal wires but appears above the carina. Enteric tube extends below the diaphragm with side-port just distal to the GE junction and tip beyond the inferior margin of the image. There is background of emphysema. There is flattening of the diaphragms with blunting of the costophrenic angles which may be related to hyperexpansion or scarring. Small pleural effusions are not excluded clinical correlation is recommended. Right upper lobe interstitial coarsening and nodularity may be chronic although developing infiltrate is not excluded. No pneumothorax identified. The cardiac silhouette is within limits. Median sternotomy wires and CABG  vascular clips. Old healed right rib fracture deformities. No acute osseous pathology. IMPRESSION: 1. Endotracheal tube appears above the carina. 2. Emphysema. 3. Right upper lobe interstitial coarsening and nodularity may be chronic although developing infiltrate is not excluded. Electronically Signed   By: Anner Crete M.D.   On: 09/06/2020 22:52    Procedures Procedures (including critical care time)  Medications Ordered in ED Medications  0.9 %  sodium chloride infusion (has no administration in time range)  methylPREDNISolone sodium succinate (SOLU-MEDROL) 125 mg/2 mL injection 125 mg (has no administration in time range)  albuterol (PROVENTIL,VENTOLIN) solution continuous neb (has no administration in time range)  ipratropium (ATROVENT) nebulizer solution 0.5 mg (has no administration in time range)  nitroGLYCERIN (NITROSTAT) SL tablet 0.4 mg (0.4 mg Sublingual Given 09/06/20 2215)  nitroGLYCERIN 50 mg in dextrose 5 % 250 mL (0.2 mg/mL) infusion (has no administration in time range)  propofol (DIPRIVAN) 1000 MG/100ML infusion (has no administration in time range)  etomidate (AMIDATE) injection (20 mg Intravenous Given 09/06/20 2225)  succinylcholine (ANECTINE) injection (150 mg Intravenous Given 09/06/20 2226)    ED Course  I have reviewed the triage vital signs and the nursing notes.  Pertinent labs & imaging results that were available during my care of the patient were reviewed by me and considered in my medical decision making (see chart for details).    MDM Rules/Calculators/A&P                          Iv ns. Continuous pulse ox and continuous cardiac monitoring. Initially, bp very high, ?acute pulm edema, ntg sl and gtt ordered, as well as solumedrol iv and continuous albuterol neb, bipap. Resp therapy consulted.   MDM Number of Diagnoses or Management Options Acute respiratory failure with hypoxia and hypercapnia ([place]): new, needed workup COPD exacerbation ([place]):  new, needed workup Malignant neoplasm of upper lobe of right lung [facility]): new, needed workup   Amount and/or Complexity of Data Reviewed Clinical lab tests: ordered and reviewed Tests in the radiology section of CPT: ordered and reviewed Tests in the medicine section of CPT: ordered and reviewed Discussion of test results with the performing providers: yes Decide to obtain previous medical records or to obtain history from someone other than the patient: yes Obtain history from someone other than the patient: yes Review and summarize past medical records: yes Discuss the patient with other providers: yes Independent visualization of images, tracings, or specimens: yes  Risk of Complications, Morbidity, and/or Mortality Presenting problems: high Diagnostic procedures: high Management options: high  Very shortly after arrival, persistent and worsening resp status with patient tiring, diaphoretic, decreased responsiveness. Plans to emergently intubate.   RSI with succ/etomidate. Resident completed on first attempt, bil bs and co2 color change. Stat pcxr.   Stat labs. covid swab sent.   Continuous neb txs.   CXR reviewed/interpreted by me - ett ok. Right upper lobe nodularity, c/w known hx RUL ca.   ICU team consulted for admission re copd exacerbation with acute resp failure.   Post intubation/sedation, bp is low/lower. Iv ns bolus. Will add cultures and lactic acid.  Post cultures, dose of iv antibiotics.  Await icu call back - will re-page.  Discussed with icu md - will admit.   CRITICAL CARE RE: acute resp failure with hypoxia, copd exacerbation.  Performed by: Mirna Mires Total critical care time: 115 minutes Critical care time was exclusive of separately billable procedures and treating other patients. Critical care was necessary to treat or prevent imminent or life-threatening deterioration. Critical care was time spent personally by me on the following activities:  development of treatment plan with patient and/or surrogate as well as nursing, discussions with consultants, evaluation of patient's response to treatment, examination of patient, obtaining history from patient or surrogate, ordering and performing treatments and interventions, ordering and review of laboratory studies, ordering and review of radiographic studies, pulse oximetry and re-evaluation of patient's condition.      Final Clinical Impression(s) / ED Diagnoses Final diagnoses:  None    Rx / DC Orders ED Discharge Orders    None       Lajean Saver, MD 09/07/20 0025

## 2020-09-07 DIAGNOSIS — E872 Acidosis: Secondary | ICD-10-CM

## 2020-09-07 DIAGNOSIS — F17210 Nicotine dependence, cigarettes, uncomplicated: Secondary | ICD-10-CM | POA: Diagnosis present

## 2020-09-07 DIAGNOSIS — R7401 Elevation of levels of liver transaminase levels: Secondary | ICD-10-CM | POA: Diagnosis present

## 2020-09-07 DIAGNOSIS — R5381 Other malaise: Secondary | ICD-10-CM | POA: Diagnosis present

## 2020-09-07 DIAGNOSIS — J302 Other seasonal allergic rhinitis: Secondary | ICD-10-CM | POA: Diagnosis present

## 2020-09-07 DIAGNOSIS — I10 Essential (primary) hypertension: Secondary | ICD-10-CM | POA: Diagnosis present

## 2020-09-07 DIAGNOSIS — Z881 Allergy status to other antibiotic agents status: Secondary | ICD-10-CM | POA: Diagnosis not present

## 2020-09-07 DIAGNOSIS — J9621 Acute and chronic respiratory failure with hypoxia: Secondary | ICD-10-CM | POA: Diagnosis present

## 2020-09-07 DIAGNOSIS — Z20822 Contact with and (suspected) exposure to covid-19: Secondary | ICD-10-CM | POA: Diagnosis present

## 2020-09-07 DIAGNOSIS — E785 Hyperlipidemia, unspecified: Secondary | ICD-10-CM | POA: Diagnosis present

## 2020-09-07 DIAGNOSIS — J441 Chronic obstructive pulmonary disease with (acute) exacerbation: Principal | ICD-10-CM

## 2020-09-07 DIAGNOSIS — J9602 Acute respiratory failure with hypercapnia: Secondary | ICD-10-CM | POA: Diagnosis not present

## 2020-09-07 DIAGNOSIS — Z9981 Dependence on supplemental oxygen: Secondary | ICD-10-CM | POA: Diagnosis not present

## 2020-09-07 DIAGNOSIS — Z681 Body mass index (BMI) 19 or less, adult: Secondary | ICD-10-CM | POA: Diagnosis not present

## 2020-09-07 DIAGNOSIS — I2510 Atherosclerotic heart disease of native coronary artery without angina pectoris: Secondary | ICD-10-CM | POA: Diagnosis present

## 2020-09-07 DIAGNOSIS — Z885 Allergy status to narcotic agent status: Secondary | ICD-10-CM | POA: Diagnosis not present

## 2020-09-07 DIAGNOSIS — K7460 Unspecified cirrhosis of liver: Secondary | ICD-10-CM | POA: Diagnosis present

## 2020-09-07 DIAGNOSIS — C3411 Malignant neoplasm of upper lobe, right bronchus or lung: Secondary | ICD-10-CM | POA: Diagnosis present

## 2020-09-07 DIAGNOSIS — Z951 Presence of aortocoronary bypass graft: Secondary | ICD-10-CM | POA: Diagnosis not present

## 2020-09-07 DIAGNOSIS — J9622 Acute and chronic respiratory failure with hypercapnia: Secondary | ICD-10-CM | POA: Diagnosis present

## 2020-09-07 DIAGNOSIS — J9601 Acute respiratory failure with hypoxia: Secondary | ICD-10-CM | POA: Diagnosis not present

## 2020-09-07 DIAGNOSIS — Z7982 Long term (current) use of aspirin: Secondary | ICD-10-CM | POA: Diagnosis not present

## 2020-09-07 DIAGNOSIS — E43 Unspecified severe protein-calorie malnutrition: Secondary | ICD-10-CM | POA: Diagnosis present

## 2020-09-07 DIAGNOSIS — I252 Old myocardial infarction: Secondary | ICD-10-CM | POA: Diagnosis not present

## 2020-09-07 DIAGNOSIS — Z79899 Other long term (current) drug therapy: Secondary | ICD-10-CM | POA: Diagnosis not present

## 2020-09-07 DIAGNOSIS — G629 Polyneuropathy, unspecified: Secondary | ICD-10-CM | POA: Diagnosis present

## 2020-09-07 LAB — RESPIRATORY PANEL BY RT PCR (FLU A&B, COVID)
Influenza A by PCR: NEGATIVE
Influenza B by PCR: NEGATIVE
SARS Coronavirus 2 by RT PCR: NEGATIVE

## 2020-09-07 LAB — BLOOD GAS, VENOUS
Acid-base deficit: 0.3 mmol/L (ref 0.0–2.0)
Bicarbonate: 23.5 mmol/L (ref 20.0–28.0)
FIO2: 40
O2 Saturation: 98.9 %
Patient temperature: 37
pCO2, Ven: 35.8 mmHg — ABNORMAL LOW (ref 44.0–60.0)
pH, Ven: 7.433 — ABNORMAL HIGH (ref 7.250–7.430)
pO2, Ven: 141 mmHg — ABNORMAL HIGH (ref 32.0–45.0)

## 2020-09-07 LAB — BRAIN NATRIURETIC PEPTIDE: B Natriuretic Peptide: 64.7 pg/mL (ref 0.0–100.0)

## 2020-09-07 LAB — I-STAT ARTERIAL BLOOD GAS, ED
Acid-base deficit: 1 mmol/L (ref 0.0–2.0)
Bicarbonate: 24.5 mmol/L (ref 20.0–28.0)
Calcium, Ion: 1.19 mmol/L (ref 1.15–1.40)
HCT: 35 % — ABNORMAL LOW (ref 39.0–52.0)
Hemoglobin: 11.9 g/dL — ABNORMAL LOW (ref 13.0–17.0)
O2 Saturation: 100 %
Patient temperature: 96.9
Potassium: 3.5 mmol/L (ref 3.5–5.1)
Sodium: 141 mmol/L (ref 135–145)
TCO2: 26 mmol/L (ref 22–32)
pCO2 arterial: 41.3 mmHg (ref 32.0–48.0)
pH, Arterial: 7.378 (ref 7.350–7.450)
pO2, Arterial: 188 mmHg — ABNORMAL HIGH (ref 83.0–108.0)

## 2020-09-07 LAB — BASIC METABOLIC PANEL
Anion gap: 13 (ref 5–15)
BUN: 13 mg/dL (ref 8–23)
CO2: 24 mmol/L (ref 22–32)
Calcium: 9.1 mg/dL (ref 8.9–10.3)
Chloride: 102 mmol/L (ref 98–111)
Creatinine, Ser: 1.04 mg/dL (ref 0.61–1.24)
GFR, Estimated: >60 mL/min (ref >60)
Glucose, Bld: 221 mg/dL — ABNORMAL HIGH (ref 70–99)
Potassium: 3.6 mmol/L (ref 3.5–5.1)
Sodium: 139 mmol/L (ref 135–145)

## 2020-09-07 LAB — CBC
HCT: 37.1 % — ABNORMAL LOW (ref 39.0–52.0)
Hemoglobin: 12.1 g/dL — ABNORMAL LOW (ref 13.0–17.0)
MCH: 31.7 pg (ref 26.0–34.0)
MCHC: 32.6 g/dL (ref 30.0–36.0)
MCV: 97.1 fL (ref 80.0–100.0)
Platelets: 117 10*3/uL — ABNORMAL LOW (ref 150–400)
RBC: 3.82 MIL/uL — ABNORMAL LOW (ref 4.22–5.81)
RDW: 13.4 % (ref 11.5–15.5)
WBC: 3.7 10*3/uL — ABNORMAL LOW (ref 4.0–10.5)
nRBC: 0 % (ref 0.0–0.2)

## 2020-09-07 LAB — HEPATIC FUNCTION PANEL
ALT: 31 U/L (ref 0–44)
AST: 87 U/L — ABNORMAL HIGH (ref 15–41)
Albumin: 3.5 g/dL (ref 3.5–5.0)
Alkaline Phosphatase: 82 U/L (ref 38–126)
Bilirubin, Direct: 0.1 mg/dL (ref 0.0–0.2)
Indirect Bilirubin: 0.8 mg/dL (ref 0.3–0.9)
Total Bilirubin: 0.9 mg/dL (ref 0.3–1.2)
Total Protein: 6.7 g/dL (ref 6.5–8.1)

## 2020-09-07 LAB — TROPONIN I (HIGH SENSITIVITY): Troponin I (High Sensitivity): 41 ng/L — ABNORMAL HIGH (ref <18)

## 2020-09-07 LAB — LACTIC ACID, PLASMA
Lactic Acid, Venous: 5.9 mmol/L (ref 0.5–1.9)
Lactic Acid, Venous: 6.2 mmol/L (ref 0.5–1.9)

## 2020-09-07 LAB — GLUCOSE, CAPILLARY
Glucose-Capillary: 126 mg/dL — ABNORMAL HIGH (ref 70–99)
Glucose-Capillary: 132 mg/dL — ABNORMAL HIGH (ref 70–99)
Glucose-Capillary: 150 mg/dL — ABNORMAL HIGH (ref 70–99)
Glucose-Capillary: 164 mg/dL — ABNORMAL HIGH (ref 70–99)
Glucose-Capillary: 180 mg/dL — ABNORMAL HIGH (ref 70–99)
Glucose-Capillary: 215 mg/dL — ABNORMAL HIGH (ref 70–99)

## 2020-09-07 LAB — HEMOGLOBIN A1C
Hgb A1c MFr Bld: 5.5 % (ref 4.8–5.6)
Mean Plasma Glucose: 111.15 mg/dL

## 2020-09-07 LAB — TRIGLYCERIDES: Triglycerides: 57 mg/dL (ref <150)

## 2020-09-07 LAB — PHOSPHORUS: Phosphorus: 3.1 mg/dL (ref 2.5–4.6)

## 2020-09-07 LAB — MAGNESIUM: Magnesium: 2.3 mg/dL (ref 1.7–2.4)

## 2020-09-07 LAB — MRSA PCR SCREENING: MRSA by PCR: NEGATIVE

## 2020-09-07 MED ORDER — ONDANSETRON HCL 4 MG PO TABS
4.0000 mg | ORAL_TABLET | Freq: Four times a day (QID) | ORAL | Status: DC | PRN
  Administered 2020-09-07: 4 mg via ORAL
  Filled 2020-09-07: qty 1

## 2020-09-07 MED ORDER — FAMOTIDINE 20 MG PO TABS: 20.0000 mg | ORAL_TABLET | Freq: Two times a day (BID) | ORAL | Status: DC

## 2020-09-07 MED ORDER — DOCUSATE SODIUM 50 MG/5ML PO LIQD: 100.0000 mg | Freq: Two times a day (BID) | ORAL | Status: DC | PRN

## 2020-09-07 MED ORDER — ACETAMINOPHEN 160 MG/5ML PO SOLN: 650.0000 mg | ORAL | Status: DC | PRN

## 2020-09-07 MED ORDER — FENTANYL 2500MCG IN NS 250ML (10MCG/ML) PREMIX INFUSION
25.0000 ug/h | INTRAVENOUS | Status: DC
  Administered 2020-09-07: 50 ug/h via INTRAVENOUS
  Filled 2020-09-07: qty 250

## 2020-09-07 MED ORDER — PREDNISONE 20 MG PO TABS: 40.0000 mg | ORAL_TABLET | Freq: Every day | ORAL | Status: DC

## 2020-09-07 MED ORDER — IPRATROPIUM-ALBUTEROL 0.5-2.5 (3) MG/3ML IN SOLN
3.0000 mL | Freq: Three times a day (TID) | RESPIRATORY_TRACT | Status: DC
  Administered 2020-09-07 – 2020-09-09 (×6): 3 mL via RESPIRATORY_TRACT
  Filled 2020-09-07 (×6): qty 3

## 2020-09-07 MED ORDER — BUDESONIDE 0.25 MG/2ML IN SUSP
0.2500 mg | Freq: Two times a day (BID) | RESPIRATORY_TRACT | Status: DC
  Administered 2020-09-07 – 2020-09-10 (×5): 0.25 mg via RESPIRATORY_TRACT
  Filled 2020-09-07 (×5): qty 2

## 2020-09-07 MED ORDER — ASPIRIN EC 81 MG PO TBEC: 81.0000 mg | DELAYED_RELEASE_TABLET | Freq: Every day | ORAL | Status: DC

## 2020-09-07 MED ORDER — ORAL CARE MOUTH RINSE
15.0000 mL | Freq: Two times a day (BID) | OROMUCOSAL | Status: DC
  Administered 2020-09-07 – 2020-09-10 (×7): 15 mL via OROMUCOSAL

## 2020-09-07 MED ORDER — ACETAMINOPHEN 160 MG/5ML PO SOLN
650.0000 mg | ORAL | Status: DC | PRN
  Administered 2020-09-08: 650 mg via ORAL
  Filled 2020-09-07: qty 20.3

## 2020-09-07 MED ORDER — ARFORMOTEROL TARTRATE 15 MCG/2ML IN NEBU
15.0000 ug | INHALATION_SOLUTION | Freq: Two times a day (BID) | RESPIRATORY_TRACT | Status: DC
  Administered 2020-09-07 – 2020-09-10 (×5): 15 ug via RESPIRATORY_TRACT
  Filled 2020-09-07 (×5): qty 2

## 2020-09-07 MED ORDER — THIAMINE HCL 100 MG PO TABS: 100.0000 mg | ORAL_TABLET | Freq: Every day | ORAL | Status: DC

## 2020-09-07 MED ORDER — DOCUSATE SODIUM 100 MG PO CAPS: 100.0000 mg | ORAL_CAPSULE | Freq: Two times a day (BID) | ORAL | Status: DC | PRN

## 2020-09-07 MED ORDER — FENTANYL CITRATE (PF) 100 MCG/2ML IJ SOLN
25.0000 ug | Freq: Once | INTRAMUSCULAR | Status: AC
  Administered 2020-09-07: 25 ug via INTRAVENOUS

## 2020-09-07 MED ORDER — SODIUM CHLORIDE 0.9 % IV SOLN
1.0000 g | INTRAVENOUS | Status: DC
  Administered 2020-09-07 – 2020-09-09 (×3): 1 g via INTRAVENOUS
  Filled 2020-09-07 (×4): qty 10

## 2020-09-07 MED ORDER — ROSUVASTATIN CALCIUM 5 MG PO TABS: 40.0000 mg | ORAL_TABLET | Freq: Every day | ORAL | Status: DC

## 2020-09-07 MED ORDER — FAMOTIDINE 20 MG PO TABS
20.0000 mg | ORAL_TABLET | Freq: Two times a day (BID) | ORAL | Status: DC
  Administered 2020-09-07 – 2020-09-09 (×5): 20 mg via ORAL
  Filled 2020-09-07 (×6): qty 1

## 2020-09-07 MED ORDER — IPRATROPIUM-ALBUTEROL 0.5-2.5 (3) MG/3ML IN SOLN
3.0000 mL | RESPIRATORY_TRACT | Status: DC
  Administered 2020-09-07 (×2): 3 mL via RESPIRATORY_TRACT
  Filled 2020-09-07 (×2): qty 3

## 2020-09-07 MED ORDER — FENTANYL CITRATE (PF) 100 MCG/2ML IJ SOLN: 50.0000 ug | INTRAMUSCULAR | Status: DC | PRN

## 2020-09-07 MED ORDER — PREDNISONE 20 MG PO TABS
40.0000 mg | ORAL_TABLET | Freq: Every day | ORAL | Status: AC
  Administered 2020-09-08 – 2020-09-10 (×3): 40 mg via ORAL
  Filled 2020-09-07 (×3): qty 2

## 2020-09-07 MED ORDER — FENTANYL BOLUS VIA INFUSION
25.0000 ug | INTRAVENOUS | Status: DC | PRN
  Filled 2020-09-07: qty 25

## 2020-09-07 MED ORDER — POLYETHYLENE GLYCOL 3350 17 G PO PACK: 17.0000 g | PACK | Freq: Every day | ORAL | Status: DC | PRN

## 2020-09-07 MED ORDER — ASPIRIN 81 MG PO CHEW
81.0000 mg | CHEWABLE_TABLET | Freq: Every day | ORAL | Status: DC
  Administered 2020-09-10: 81 mg via ORAL
  Filled 2020-09-07: qty 1

## 2020-09-07 MED ORDER — CHLORHEXIDINE GLUCONATE CLOTH 2 % EX PADS
6.0000 | MEDICATED_PAD | Freq: Every day | CUTANEOUS | Status: DC
  Administered 2020-09-08 – 2020-09-10 (×3): 6 via TOPICAL

## 2020-09-07 MED ORDER — CHLORHEXIDINE GLUCONATE 0.12% ORAL RINSE (MEDLINE KIT)
15.0000 mL | Freq: Two times a day (BID) | OROMUCOSAL | Status: DC
  Administered 2020-09-07: 15 mL via OROMUCOSAL

## 2020-09-07 MED ORDER — FAMOTIDINE 40 MG/5ML PO SUSR: 20.0000 mg | Freq: Two times a day (BID) | ORAL | Status: DC

## 2020-09-07 MED ORDER — ACETAMINOPHEN 325 MG PO TABS: 650.0000 mg | ORAL_TABLET | ORAL | Status: DC | PRN

## 2020-09-07 MED ORDER — INSULIN ASPART 100 UNIT/ML ~~LOC~~ SOLN
0.0000 [IU] | SUBCUTANEOUS | Status: DC
  Administered 2020-09-07 (×3): 1 [IU] via SUBCUTANEOUS
  Administered 2020-09-07 (×2): 2 [IU] via SUBCUTANEOUS
  Administered 2020-09-07: 3 [IU] via SUBCUTANEOUS
  Administered 2020-09-08: 1 [IU] via SUBCUTANEOUS
  Administered 2020-09-09: 2 [IU] via SUBCUTANEOUS

## 2020-09-07 MED ORDER — THIAMINE HCL 100 MG PO TABS
100.0000 mg | ORAL_TABLET | Freq: Every day | ORAL | Status: DC
  Administered 2020-09-08: 100 mg via ORAL
  Filled 2020-09-07: qty 1

## 2020-09-07 MED ORDER — ORAL CARE MOUTH RINSE: 15.0000 mL | OROMUCOSAL | Status: DC

## 2020-09-07 MED ORDER — FENTANYL CITRATE (PF) 100 MCG/2ML IJ SOLN: 25.0000 ug | INTRAMUSCULAR | Status: DC | PRN

## 2020-09-07 MED ORDER — MIDAZOLAM HCL 2 MG/2ML IJ SOLN: 1.0000 mg | INTRAMUSCULAR | Status: DC | PRN

## 2020-09-07 MED ORDER — ROSUVASTATIN CALCIUM 20 MG PO TABS
40.0000 mg | ORAL_TABLET | Freq: Every day | ORAL | Status: DC
  Administered 2020-09-08 – 2020-09-10 (×3): 40 mg via ORAL
  Filled 2020-09-07 (×2): qty 2
  Filled 2020-09-07: qty 8
  Filled 2020-09-07 (×2): qty 2

## 2020-09-07 MED ORDER — SODIUM CHLORIDE 0.9 % IV SOLN
500.0000 mg | INTRAVENOUS | Status: AC
  Administered 2020-09-08 (×2): 500 mg via INTRAVENOUS
  Filled 2020-09-07 (×2): qty 500

## 2020-09-07 MED ORDER — ASPIRIN 81 MG PO CHEW: 81.0000 mg | CHEWABLE_TABLET | Freq: Every day | ORAL | Status: DC

## 2020-09-07 MED ORDER — ENOXAPARIN SODIUM 40 MG/0.4ML ~~LOC~~ SOLN
40.0000 mg | SUBCUTANEOUS | Status: DC
  Filled 2020-09-07 (×3): qty 0.4

## 2020-09-07 NOTE — Progress Notes (Signed)
PCCM:  Patient tolerating SAT SBT Plans for extubation  New neb orders placed   Garner Nash, DO [facility] 09/07/2020 10:13 AM

## 2020-09-07 NOTE — ED Notes (Signed)
Attempted to decrease propofol; pt coughing over the vent, sitting straight up in bed. Propofol increased

## 2020-09-07 NOTE — Progress Notes (Signed)
Pt transported to 53m 08 from ED w/o event.

## 2020-09-07 NOTE — Progress Notes (Signed)
Dr. Valeta Harms notified of decreased urine output today. No new orders at this time.

## 2020-09-07 NOTE — Procedures (Signed)
Extubation Procedure Note  Patient Details:   Name: ROLLIE HYNEK DOB: [birth year] MRN: [id number]   Airway Documentation:    Vent end date: 09/07/20 Vent end time: 0825   Evaluation  O2 sats: stable throughout Complications: No apparent complications Patient did tolerate procedure well. Bilateral Breath Sounds: Diminished   Yes  Ellis Koffler 09/07/2020, 8:31 AM

## 2020-09-07 NOTE — H&P (Addendum)
NAME:  Lucas [name], MRN:  [id number], DOB:  1951-07-07, LOS: 0 ADMISSION DATE:  09/06/2020, CONSULTATION DATE:  09/07/20  REFERRING MD:  Lajean Saver, MD CHIEF COMPLAINT:  COPD exacerabation   Brief History   Lucas [name] is a 69 y.o. male who presents for COPD exacerbation requiring invasive mechanical ventilation.  History of present illness   Lucas [name] is a 69 y.o. male with h/o moderate COPD (FEV1 65% pred 12/13/19), CHRF on 3L/min O2, RUL SCC s/p SBRT, CAD s/p CABG, alcohol abuse and HTN who presented in extremis with hypoxia and increased work of breathing. He was given 2 g Mag Sulfate, 125 mg SoluMedrol and Duo-Neb x2 with EMS PTA. He was intubated shortly after arrival to the ED. Post-intubation blood gas demonstrated pH 7.157 with pCO2 of 63 and pO2 of 206 on 100% FiO2. He was given continuous albuterol nebs while in the ED. His wife reports that his shortness of breath worsened over the last few days. No sick contacts at home. No URI symptoms. No clear inciting factor. CXR demonstrated emphysema with RUL interstitial coarsening in the area of his known malignancy. Covid negative. Labs notable for elevated lactic at 5.9 (after continuous albuterol nebs), AST at 82 and TBili at 1.5.   Past Medical History  Moderate COPD Chronic hypoxic respiratory failure SCC of right upper lobe s/p SBRT CAD s/p CABG HTN  Significant Hospital Events   Intubation 11/14  Consults:  PCCM  Procedures:  Intubation 11/14  Significant Diagnostic Tests:  CXR 11/14:  IMPRESSION: 1. Endotracheal tube appears above the carina. 2. Emphysema. 3. Right upper lobe interstitial coarsening and nodularity may be chronic although developing infiltrate is not excluded.  Micro Data:  Blood cx, trach asp (11/14) pending  Antimicrobials:  Ceftriaxone, azithromycin 11/14-   Objective   Blood pressure 106/64, pulse 99, temperature (!) 96.9 F (36.1 C), temperature source Temporal, resp. rate (!) 24,  height 5\' 11"  (1.803 m), weight 58.1 kg, SpO2 100 %.    Vent Mode: PRVC FiO2 (%):  [40 %] 40 % Set Rate:  [24 bmp] 24 bmp Vt Set:  [600 mL] 600 mL PEEP:  [5 cmH20] 5 cmH20 Plateau Pressure:  [22 cmH20] 22 cmH20  No intake or output data in the 24 hours ending 09/07/20 0106 Filed Weights   09/06/20 2222  Weight: 58.1 kg    Examination: General: thin African American male, intubated HENT: ETT in place, edentulous Lungs: diffusely decreased breath sounds, poor air movement, no wheezing Cardiovascular: tachycardiac, no MRG Abdomen: soft, nontender, nondistended, NABS Extremities: no C/C/E Neuro: awakens to voice, following commands GU: deferred  Assessment & Plan:  1. Acute on chronic hypoxic and hypercarbic respiratory failure 2. COPD exacerbation 3. SCC of RUL s/p SBRT 4. Lactic acidosis 5. Elevated LFTs 6. Alcohol abuse  - PRVC @ 8 cc/kg IBW - F/u repeat ABG, would decrease RR to allow for complete exhalation if CO2 improving - Wean FiO2, PEEP as able - RASS goal 0; minimize sedation as able - Daily SAT/SBT - Continue ceftriaxone, azithromycin; f/u cx data - Prednisone 40 mg daily to complete 5 days of steroids - Duo-Nebs Q4H sched, Brovana/Pulmicort nebs - Trend lactic, anticipate elevation due to continuous albuterol; will hold additional IVF pending repeat - Mild elevation in LFTs may be related to alcohol abuse acute illness; repeat AM CMP and if not improved, consider hepatitis panel and RUQ U/S - CIWA protocol once extubated  Best practice:  Diet: NPO Pain/Anxiety/Delirium  protocol (if indicated): propofol, fentanyl VAP protocol (if indicated): per protocol DVT prophylaxis: Lovenox GI prophylaxis: famotidine Glucose control: SSI Mobility: bed rest Code Status: Full Code Family Communication: Wife, Allah Reason [phone]) Disposition: Admit to ICU  Labs   CBC: Recent Labs  Lab 09/06/20 2214 09/06/20 2308 09/07/20 0058  WBC 6.7  --   --   HGB 14.2  14.6 11.9*  HCT 45.1 43.0 35.0*  MCV 100.2*  --   --   PLT 168  --   --     Basic Metabolic Panel: Recent Labs  Lab 09/06/20 2214 09/06/20 2308 09/07/20 0058  NA 139 138 141  K 5.8* 4.2 3.5  CL 103  --   --   CO2 26  --   --   GLUCOSE 177*  --   --   BUN 11  --   --   CREATININE 0.83  --   --   CALCIUM 9.7  --   --    GFR: Estimated Creatinine Clearance: 68.1 mL/min (by C-G formula based on SCr of 0.83 mg/dL). Recent Labs  Lab 09/06/20 2214 09/06/20 2353  WBC 6.7  --   LATICACIDVEN  --  5.9*    Liver Function Tests: Recent Labs  Lab 09/06/20 2214  AST 82*  ALT 26  ALKPHOS 97  BILITOT 1.5*  PROT 7.9  ALBUMIN 4.2   No results for input(s): LIPASE, AMYLASE in the last 168 hours. No results for input(s): AMMONIA in the last 168 hours.  ABG    Component Value Date/Time   PHART 7.378 09/07/2020 0058   PCO2ART 41.3 09/07/2020 0058   PO2ART 188 (H) 09/07/2020 0058   HCO3 24.5 09/07/2020 0058   TCO2 26 09/07/2020 0058   ACIDBASEDEF 1.0 09/07/2020 0058   O2SAT 100.0 09/07/2020 0058     Coagulation Profile: No results for input(s): INR, PROTIME in the last 168 hours.  Cardiac Enzymes: No results for input(s): CKTOTAL, CKMB, CKMBINDEX, TROPONINI in the last 168 hours.  HbA1C: Hgb A1c MFr Bld  Date/Time Value Ref Range Status  05/02/2020 12:28 PM 5.5 4.8 - 5.6 % Final    Comment:    (NOTE) Pre diabetes:          5.7%-6.4%  Diabetes:              >6.4%  Glycemic control for   <7.0% adults with diabetes   12/13/2019 03:31 AM 6.1 (H) 4.8 - 5.6 % Final    Comment:    (NOTE) Pre diabetes:          5.7%-6.4% Diabetes:              >6.4% Glycemic control for   <7.0% adults with diabetes     CBG: No results for input(s): GLUCAP in the last 168 hours.  Review of Systems:   Unable to assess due to patient status.  Past Medical History  He,  has a past medical history of Acute blood loss anemia (06/21/2018), Acute upper GI bleed (06/21/2018),  Allergy, Bilateral AVN of femurs ([place]) (06/25/2018), COPD (12/21/2006), Environmental allergies, History of femur fracture (06/25/2018), History of Fracture of right tibia (06/26/2018), History of multiple right thoacic rib fractures (06/25/2018), History of Right clavicular fracture (06/25/2018), HYPERLIPIDEMIA (12/21/2006), HYPERTENSION, BENIGN SYSTEMIC (12/21/2006), Mallory-Weiss tear, and TOBACCO DEPENDENCE (12/21/2006).   Surgical History    Past Surgical History:  Procedure Laterality Date  . ANKLE SURGERY     left ankle  . APPLICATION OF WOUND VAC  01/06/2012   Procedure: APPLICATION OF WOUND VAC;  Surgeon: Meredith Pel, MD;  Location: [facility];  Service: Orthopedics;  Laterality: Left;  . APPLICATION OF WOUND VAC Right 10/17/2015   Procedure: APPLICATION OF WOUND VAC;  Surgeon: Leandrew Koyanagi, MD;  Location: [place];  Service: Orthopedics;  Laterality: Right;  . BIOPSY  06/22/2018   Procedure: BIOPSY;  Surgeon: [id number] v, DO;  Location: [place] [facility];  Service: Gastroenterology;;  . BRONCHIAL BIOPSY  05/04/2020   Procedure: BRONCHIAL BIOPSIES;  Surgeon: Garner Nash, DO;  Location: [facility];  Service: Pulmonary;;  . BRONCHIAL BRUSHINGS  05/04/2020   Procedure: BRONCHIAL BRUSHINGS;  Surgeon: Garner Nash, DO;  Location: [facility];  Service: Pulmonary;;  . BRONCHIAL NEEDLE ASPIRATION BIOPSY  05/04/2020   Procedure: BRONCHIAL NEEDLE ASPIRATION BIOPSIES;  Surgeon: Garner Nash, DO;  Location: [place];  Service: Pulmonary;;  . BRONCHIAL WASHINGS  05/04/2020   Procedure: BRONCHIAL WASHINGS;  Surgeon: Garner Nash, DO;  Location: [place];  Service: Pulmonary;;  . CORONARY ARTERY BYPASS GRAFT N/A 12/16/2019   Procedure: CORONARY ARTERY BYPASS GRAFTING (CABG) TIMES 4 USING LEFT INTERNAL MAMMARY ARTERY AND ENDOSCOPICALLY HARVESTED RIGHT SAPHENOUS VEIN;  Surgeon: Lajuana Matte, MD;  Location: [place];  Service: Open Heart Surgery;  Laterality: N/A;  .  ESOPHAGOGASTRODUODENOSCOPY (EGD) WITH PROPOFOL N/A 06/22/2018   Procedure: ESOPHAGOGASTRODUODENOSCOPY (EGD) WITH PROPOFOL;  Surgeon: [id number] v, DO;  Location: [place];  Service: Gastroenterology;  Laterality: N/A;  . EXTERNAL FIXATION LEG  01/06/2012   Procedure: EXTERNAL FIXATION LEG;  Surgeon: Meredith Pel, MD;  Location: [facility];  Service: Orthopedics;  Laterality: Left;  Smith-nephew external fixator set  . EXTERNAL FIXATION LEG Right 09/16/2015   Procedure: EXTERNAL FIXATION LEG/LARGE;  Surgeon: Mcarthur Rossetti, MD;  Location: [place];  Service: Orthopedics;  Laterality: Right;  . EXTERNAL FIXATION REMOVAL Right 09/22/2015   Procedure: REMOVAL EXTERNAL FIXATION LEG;  Surgeon: Mcarthur Rossetti, MD;  Location: [place];  Service: Orthopedics;  Laterality: Right;  . FASCIOTOMY  01/10/2012   Procedure: FASCIOTOMY;  Surgeon: Rozanna Box, MD;  Location: [place];  Service: Orthopedics;  Laterality: Left;  status post fasciotomy with wound vac left calf; adjustment external fixator left tibia under fluoro; retention suture/wound vac placement left lateral calf wound  . FASCIOTOMY  01/06/2012   Procedure: FASCIOTOMY;  Surgeon: Meredith Pel, MD;  Location: [facility];  Service: Orthopedics;  Laterality: Left;  Four compartment fasciotomy  . FIDUCIAL MARKER PLACEMENT  05/04/2020   Procedure: FIDUCIAL MARKER PLACEMENT;  Surgeon: Garner Nash, DO;  Location: [facility];  Service: Pulmonary;;  . HOT HEMOSTASIS N/A 06/22/2018   Procedure: HEMOSTASIS;  Surgeon: Dorothea Ogle v, DO;  Location: [place];  Service: Gastroenterology;  Laterality: N/A;  Epinepherine and clip placed  . I & D EXTREMITY  01/12/2012   Procedure: IRRIGATION AND DEBRIDEMENT EXTREMITY;  Surgeon: Rozanna Box, MD;  Location: [place];  Service: Orthopedics;  Laterality: Left;  LAYERD CLOSURE LEFT LEG WOUND  . I & D EXTREMITY Right 10/17/2015   Procedure: IRRIGATION AND DEBRIDEMENT EXTREMITY;  Surgeon:  Leandrew Koyanagi, MD;  Location: [place];  Service: Orthopedics;  Laterality: Right;  . I & D EXTREMITY Right 10/20/2015   Procedure: REPEAT IRRIGATION AND DEBRIDEMENT EXTREMITY, right lower leg;  Surgeon: Mcarthur Rossetti, MD;  Location: [place];  Service: Orthopedics;  Laterality: Right;  . KNEE SURGERY     left knee and right knee  . LEFT HEART CATH  AND CORONARY ANGIOGRAPHY N/A 06/27/2018   Procedure: LEFT HEART CATH AND CORONARY ANGIOGRAPHY;  Surgeon: Martinique, Peter M, MD;  Location: [place];  Service: Cardiovascular;  Laterality: N/A;  . LEFT HEART CATH AND CORONARY ANGIOGRAPHY N/A 12/12/2019   Procedure: LEFT HEART CATH AND CORONARY ANGIOGRAPHY;  Surgeon: Wellington Hampshire, MD;  Location: [facility] [facility];  Service: Cardiovascular;  Laterality: N/A;  . MASS EXCISION Left 09/22/2015   Procedure: EXCISION MASS;  Surgeon: Mcarthur Rossetti, MD;  Location: [place];  Service: Orthopedics;  Laterality: Left;  . ORIF TIBIA PLATEAU  02/07/2012   Procedure: OPEN REDUCTION INTERNAL FIXATION (ORIF) TIBIAL PLATEAU;  Surgeon: Rozanna Box, MD;  Location: [place];  Service: Orthopedics;  Laterality: Left;  ORIF LEFT TIBIAL PLATEAU/ REMOVE EXTERNAL FIXATION LEFT LEG  . ORIF TIBIA PLATEAU Right 09/22/2015   Procedure: REMOVAL OF EXTERNAL FIXATOR RIGHT LEG, OPEN REDUCTION INTERNAL FIXATION (ORIF) RIGHT TIBIAL PLATEAU, EXCISION SMALL MASS LEFT LEG;  Surgeon: Mcarthur Rossetti, MD;  Location: [place];  Service: Orthopedics;  Laterality: Right;  . SKIN SPLIT GRAFT Right 10/23/2015   Procedure: RIGHT GASTROC FLAP SPLIT THICKNESS SKIN GRAFT FROM RIGHT THING TO RIGHT LEG;  Surgeon: Irene Limbo, MD;  Location: [place];  Service: Plastics;  Laterality: Right;  . TEE WITHOUT CARDIOVERSION N/A 12/16/2019   Procedure: TRANSESOPHAGEAL ECHOCARDIOGRAM (TEE);  Surgeon: Lajuana Matte, MD;  Location: [place];  Service: Open Heart Surgery;  Laterality: N/A;  . VIDEO BRONCHOSCOPY WITH ENDOBRONCHIAL  NAVIGATION Right 05/04/2020   Procedure: VIDEO BRONCHOSCOPY WITH ENDOBRONCHIAL NAVIGATION;  Surgeon: Garner Nash, DO;  Location: [place];  Service: Pulmonary;  Laterality: Right;  . WRIST SURGERY     left     Social History   reports that he has been smoking cigarettes. He has a 25.50 pack-year smoking history. He quit smokeless tobacco use about 55 years ago.  His smokeless tobacco use included snuff and chew. He reports current alcohol use of about 6.0 standard drinks of alcohol per week. He reports that he does not use drugs.   Family History   His family history includes Emphysema in his father.   Allergies Allergies  Allergen Reactions  . Ace Inhibitors Swelling  . Ampicillin Nausea And Vomiting  . Penicillins Nausea And Vomiting    Did it involve swelling of the face/tongue/throat, SOB, or low BP? N Did it involve sudden or severe rash/hives, skin peeling, or any reaction on the inside of your mouth or nose? N Did you need to seek medical attention at a hospital or doctor's office? N When did it last happen?Teenager If all above answers are "NO", may proceed with cephalosporin use.  . Codeine Nausea And Vomiting  . Lisinopril Swelling    REACTION: angioedema     Home Medications  Prior to Admission medications   Medication Sig Start Date End Date Taking? Authorizing Provider  albuterol (PROAIR HFA) 108 (90 Base) MCG/ACT inhaler INHALE 2 PUFFS BY MOUTH EVERY 4 HOURS AS NEEDED FOR WHEEZE OR FOR SHORTNESS OF BREATH Patient taking differently: Inhale 2 puffs into the lungs every 4 (four) hours as needed for wheezing or shortness of breath.  02/08/20   Brimage, Ronnette Juniper, DO  ANORO ELLIPTA 62.5-25 MCG/INH AEPB TAKE 1 PUFF BY MOUTH EVERY DAY 07/16/20   [name], DO  aspirin EC 81 MG tablet Take 81 mg by mouth daily.  Patient not taking: Reported on 05/26/2020    [provider]  bisacodyl (BISACODYL)  5 MG EC tablet Take 15 mg by mouth daily as needed for  moderate constipation. Patient not taking: Reported on 05/26/2020    [provider]  fluticasone (FLONASE) 50 MCG/ACT nasal spray SPRAY 2 SPRAYS INTO EACH NOSTRIL EVERY DAY Patient taking differently: Place 1 spray into both nostrils daily as needed for allergies.  07/19/18   Steve Rattler, DO  losartan (COZAAR) 25 MG tablet Take 0.5 tablets (12.5 mg total) by mouth daily. Patient not taking: Reported on 05/26/2020 12/23/19   Barrett, Junie Panning R, PA-C  metoprolol tartrate (LOPRESSOR) 25 MG tablet TAKE 1.5 TABLETS (37.5 MG TOTAL) BY MOUTH 2 (TWO) TIMES DAILY. Patient not taking: Reported on 05/26/2020 12/27/19   Lajuana Matte, MD  nitroGLYCERIN (NITROSTAT) 0.4 MG SL tablet PLACE 1 TABLET (0.4 MG TOTAL) UNDER THE TONGUE EVERY 5 (FIVE) MINUTES AS NEEDED FOR CHEST PAIN. 03/02/20   Brimage, Ronnette Juniper, DO  OXYGEN Inhale 3-4 L into the lungs See admin instructions. Continuous at night, as needed during the day    [provider]  oxymetazoline (AFRIN) 0.05 % nasal spray Place 1 spray into both nostrils 2 (two) times daily as needed for congestion (nose bleeds).     [provider]  rosuvastatin (CRESTOR) 40 MG tablet Take 1 tablet (40 mg total) by mouth daily. Patient not taking: Reported on 05/26/2020 02/08/20   [name], DO  thiamine 100 MG tablet Take 1 tablet (100 mg total) by mouth daily. Patient not taking: Reported on 05/26/2020 05/06/20   Patriciaann Clan, DO  loratadine (CLARITIN) 10 MG tablet Take 1 tablet (10 mg total) by mouth daily. 03/04/11 05/23/18  Mariana Arn, MD  metoprolol tartrate (LOPRESSOR) 25 MG tablet Take 1 tablet (25 mg total) by mouth 2 (two) times daily. 05/29/19   [name], DO     Critical care time: 50 minutes

## 2020-09-07 NOTE — ED Notes (Signed)
Pt awake and coughing over the vent, wife at bedside.

## 2020-09-08 DIAGNOSIS — C3411 Malignant neoplasm of upper lobe, right bronchus or lung: Secondary | ICD-10-CM

## 2020-09-08 DIAGNOSIS — J9601 Acute respiratory failure with hypoxia: Secondary | ICD-10-CM | POA: Diagnosis not present

## 2020-09-08 DIAGNOSIS — J441 Chronic obstructive pulmonary disease with (acute) exacerbation: Secondary | ICD-10-CM | POA: Diagnosis not present

## 2020-09-08 DIAGNOSIS — J9602 Acute respiratory failure with hypercapnia: Secondary | ICD-10-CM | POA: Diagnosis not present

## 2020-09-08 LAB — CBC
HCT: 35.2 % — ABNORMAL LOW (ref 39.0–52.0)
HCT: 36.9 % — ABNORMAL LOW (ref 39.0–52.0)
Hemoglobin: 11.7 g/dL — ABNORMAL LOW (ref 13.0–17.0)
Hemoglobin: 12.1 g/dL — ABNORMAL LOW (ref 13.0–17.0)
MCH: 31.7 pg (ref 26.0–34.0)
MCH: 31.8 pg (ref 26.0–34.0)
MCHC: 33.2 g/dL (ref 30.0–36.0)
MCHC: 32.8 g/dL (ref 30.0–36.0)
MCV: 95.4 fL (ref 80.0–100.0)
MCV: 96.9 fL (ref 80.0–100.0)
Platelets: 121 10*3/uL — ABNORMAL LOW (ref 150–400)
Platelets: 125 10*3/uL — ABNORMAL LOW (ref 150–400)
RBC: 3.69 MIL/uL — ABNORMAL LOW (ref 4.22–5.81)
RBC: 3.81 MIL/uL — ABNORMAL LOW (ref 4.22–5.81)
RDW: 13.7 % (ref 11.5–15.5)
RDW: 13.8 % (ref 11.5–15.5)
WBC: 8.2 10*3/uL (ref 4.0–10.5)
WBC: 7.9 10*3/uL (ref 4.0–10.5)
nRBC: 0 % (ref 0.0–0.2)
nRBC: 0 % (ref 0.0–0.2)

## 2020-09-08 LAB — COMPREHENSIVE METABOLIC PANEL
ALT: 26 U/L (ref 0–44)
ALT: 28 U/L (ref 0–44)
AST: 47 U/L — ABNORMAL HIGH (ref 15–41)
AST: 53 U/L — ABNORMAL HIGH (ref 15–41)
Albumin: 3.4 g/dL — ABNORMAL LOW (ref 3.5–5.0)
Albumin: 3.6 g/dL (ref 3.5–5.0)
Alkaline Phosphatase: 67 U/L (ref 38–126)
Alkaline Phosphatase: 68 U/L (ref 38–126)
Anion gap: 9 (ref 5–15)
Anion gap: 10 (ref 5–15)
BUN: 6 mg/dL — ABNORMAL LOW (ref 8–23)
BUN: 8 mg/dL (ref 8–23)
CO2: 27 mmol/L (ref 22–32)
CO2: 27 mmol/L (ref 22–32)
Calcium: 9.7 mg/dL (ref 8.9–10.3)
Calcium: 9.8 mg/dL (ref 8.9–10.3)
Chloride: 102 mmol/L (ref 98–111)
Chloride: 101 mmol/L (ref 98–111)
Creatinine, Ser: 0.74 mg/dL (ref 0.61–1.24)
Creatinine, Ser: 0.97 mg/dL (ref 0.61–1.24)
GFR, Estimated: >60 mL/min (ref >60)
GFR, Estimated: >60 mL/min (ref >60)
Glucose, Bld: 101 mg/dL — ABNORMAL HIGH (ref 70–99)
Glucose, Bld: 120 mg/dL — ABNORMAL HIGH (ref 70–99)
Potassium: 4.1 mmol/L (ref 3.5–5.1)
Potassium: 4.2 mmol/L (ref 3.5–5.1)
Sodium: 138 mmol/L (ref 135–145)
Sodium: 138 mmol/L (ref 135–145)
Total Bilirubin: 0.8 mg/dL (ref 0.3–1.2)
Total Bilirubin: 0.8 mg/dL (ref 0.3–1.2)
Total Protein: 6.4 g/dL — ABNORMAL LOW (ref 6.5–8.1)
Total Protein: 6.6 g/dL (ref 6.5–8.1)

## 2020-09-08 LAB — GLUCOSE, CAPILLARY
Glucose-Capillary: 107 mg/dL — ABNORMAL HIGH (ref 70–99)
Glucose-Capillary: 109 mg/dL — ABNORMAL HIGH (ref 70–99)
Glucose-Capillary: 109 mg/dL — ABNORMAL HIGH (ref 70–99)
Glucose-Capillary: 119 mg/dL — ABNORMAL HIGH (ref 70–99)
Glucose-Capillary: 124 mg/dL — ABNORMAL HIGH (ref 70–99)
Glucose-Capillary: 96 mg/dL (ref 70–99)

## 2020-09-08 LAB — MAGNESIUM: Magnesium: 1.9 mg/dL (ref 1.7–2.4)

## 2020-09-08 LAB — BRAIN NATRIURETIC PEPTIDE: B Natriuretic Peptide: 498.6 pg/mL — ABNORMAL HIGH (ref 0.0–100.0)

## 2020-09-08 LAB — PHOSPHORUS: Phosphorus: 3.5 mg/dL (ref 2.5–4.6)

## 2020-09-08 MED ORDER — LORAZEPAM 1 MG PO TABS: 1.0000 mg | ORAL_TABLET | ORAL | Status: DC | PRN

## 2020-09-08 MED ORDER — THIAMINE HCL 100 MG PO TABS
100.0000 mg | ORAL_TABLET | Freq: Every day | ORAL | Status: DC
  Administered 2020-09-09 – 2020-09-10 (×2): 100 mg via ORAL
  Filled 2020-09-08 (×2): qty 1

## 2020-09-08 MED ORDER — NITROGLYCERIN 0.4 MG SL SUBL
0.4000 mg | SUBLINGUAL_TABLET | SUBLINGUAL | Status: DC | PRN
  Administered 2020-09-08: 0.4 mg via SUBLINGUAL

## 2020-09-08 MED ORDER — THIAMINE HCL 100 MG/ML IJ SOLN: 100.0000 mg | Freq: Every day | INTRAMUSCULAR | Status: DC

## 2020-09-08 MED ORDER — ENSURE ENLIVE PO LIQD
237.0000 mL | Freq: Three times a day (TID) | ORAL | Status: DC
  Administered 2020-09-08 – 2020-09-10 (×6): 237 mL via ORAL

## 2020-09-08 MED ORDER — NITROGLYCERIN 0.4 MG SL SUBL
SUBLINGUAL_TABLET | SUBLINGUAL | Status: AC
  Filled 2020-09-08: qty 1

## 2020-09-08 MED ORDER — ADULT MULTIVITAMIN W/MINERALS CH
1.0000 | ORAL_TABLET | Freq: Every day | ORAL | Status: DC
  Administered 2020-09-08 – 2020-09-10 (×3): 1 via ORAL
  Filled 2020-09-08 (×2): qty 1

## 2020-09-08 MED ORDER — POLYETHYLENE GLYCOL 3350 17 G PO PACK
17.0000 g | PACK | Freq: Every day | ORAL | Status: DC | PRN
  Filled 2020-09-08: qty 1

## 2020-09-08 MED ORDER — FOLIC ACID 1 MG PO TABS
1.0000 mg | ORAL_TABLET | Freq: Every day | ORAL | Status: DC
  Administered 2020-09-08 – 2020-09-10 (×3): 1 mg via ORAL
  Filled 2020-09-08 (×3): qty 1

## 2020-09-08 MED ORDER — LORAZEPAM 2 MG/ML IJ SOLN: 1.0000 mg | INTRAMUSCULAR | Status: DC | PRN

## 2020-09-08 NOTE — Progress Notes (Signed)
PROGRESS NOTE        PATIENT DETAILS Name: Lucas [name] Age: 69 y.o. Sex: male Date of Birth: [birth year] Admit Date: 09/06/2020 Admitting Physician Adam Sharene Butters, MD RAQ:[id number], Ronnette Juniper, DO  Brief Narrative: Patient is a 69 y.o. 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  Initially managed in the ICU-extubated-and transferred to [facility].  See below for further details.  Significant events: 11/14>> presented to the ED with severe shortness of breath-extremities-intubated-admitted by PCCM. 11/15>> extubated 11/16>> transfer to [facility].  Significant studies: 11/14>> chest x-ray: Right upper lobe interstitial coarsening and nodularity  Antimicrobial therapy: Rocephin: 11/14>> Zithromax: 11/14>>  Microbiology data: 11/14>> blood culture: No growth 11/15>> tracheal aspirate: Pending  Procedures : None  Consults: PCCM  DVT Prophylaxis : enoxaparin (LOVENOX) injection 40 mg Start: 09/07/20 1400  Subjective: Developed bronchospasm as soon as he woke up this morning-was anxious.  But when I saw him only-he had settled down-and was much more comfortable.  Assessment/Plan: Acute on chronic hypoxic and hypercarbic respiratory failure due to COPD exacerbation: Doing well-extubated on 11/15.  Moving air well-no rhonchi heard-some component of anxiety this morning.  Continue tapering steroids, Rocephin/Zithromax.  Normally on 3 L of oxygen at home-she mostly uses at night.  Transaminitis: Downtrending-could have had mild alcoholic hepatitis.  History of alcohol abuse: No alcohol withdrawal symptoms-on Ativan per CIWA protocol. Per spouse-drinks daily-mostly beer-anywhere from 4-6 cans a day.  CAD: Stable-continue aspirin, and statin.  Apparently does get  occasional chest tightness-and uses a nitroglycerin sublingual tablet at home.  Will start metoprolol in the next few days.  Tobacco Abuse: Counseled  History of RUL SCC-s/p SBRT: Continue outpatient follow-up with radiation oncology and medical oncology.  Debility/deconditioning: Await PT/OT.   Diet: Diet Order            Diet regular Room service appropriate? Yes; Fluid consistency: Thin  Diet effective now                  Code Status: Full code   Family Communication: Spoke with spouse [name] [phone] ) over phone on 11/16  Disposition Plan: Status is: Inpatient  Remains inpatient appropriate because:Inpatient level of care appropriate due to severity of illness   Dispo: The patient is from: Home              Anticipated d/c is to: TBD              Anticipated d/c date is: 3 days              Patient currently is not medically stable to d/c.   Barriers to Discharge: COPD exacerbation-s/p extubation on 11/15-needs a few more days of hospitalization for optimization of medications and assessment by PT/OT for safe discharge.  Antimicrobial agents: Anti-infectives (From admission, onward)   Start     Dose/Rate Route Frequency Ordered Stop   09/08/20 0000  azithromycin (ZITHROMAX) 500 mg in sodium chloride 0.9 % 250 mL IVPB        500 mg 250 mL/hr over 60 Minutes Intravenous Every 24 hours 09/07/20 0126 09/09/20 2359   09/07/20 2200  cefTRIAXone (ROCEPHIN) 1 g in sodium chloride 0.9 % 100  mL IVPB        1 g 200 mL/hr over 30 Minutes Intravenous Every 24 hours 09/07/20 0126 09/11/20 2159   09/07/20 0000  cefTRIAXone (ROCEPHIN) 1 g in sodium chloride 0.9 % 100 mL IVPB        1 g 200 mL/hr over 30 Minutes Intravenous  Once 09/06/20 2354 09/07/20 0055   09/07/20 0000  azithromycin (ZITHROMAX) 500 mg in sodium chloride 0.9 % 250 mL IVPB        500 mg 250 mL/hr over 60 Minutes Intravenous  Once 09/06/20 2354 09/07/20 0138       Time spent: 25 minutes-Greater  than 50% of this time was spent in counseling, explanation of diagnosis, planning of further management, and coordination of care.  MEDICATIONS: Scheduled Meds: . arformoterol  15 mcg Nebulization BID  . aspirin  81 mg Oral Daily  . budesonide (PULMICORT) nebulizer solution  0.25 mg Nebulization BID  . Chlorhexidine Gluconate Cloth  6 each Topical Daily  . enoxaparin (LOVENOX) injection  40 mg Subcutaneous Q24H  . famotidine  20 mg Oral BID  . insulin aspart  0-9 Units Subcutaneous Q4H  . ipratropium-albuterol  3 mL Nebulization TID  . mouth rinse  15 mL Mouth Rinse BID  . nitroGLYCERIN      . predniSONE  40 mg Oral Daily  . rosuvastatin  40 mg Oral Daily  . thiamine  100 mg Oral Daily   Continuous Infusions: . sodium chloride 10 mL/hr at 09/07/20 1025  . azithromycin Stopped (09/08/20 0103)  . cefTRIAXone (ROCEPHIN)  IV Stopped (09/07/20 2135)   PRN Meds:.acetaminophen, docusate, nitroGLYCERIN, ondansetron, polyethylene glycol   PHYSICAL EXAM: Vital signs: Vitals:   09/08/20 0900 09/08/20 1000 09/08/20 1116 09/08/20 1132  BP: 121/79 116/66    Pulse: 98 81 (!) 113   Resp:      Temp:    98.7 F (37.1 C)  TempSrc:    Oral  SpO2: 100% 100% 96%   Weight:      Height:       Filed Weights   09/06/20 2222 09/07/20 0353 09/08/20 0311  Weight: 58.1 kg 58.1 kg 58.1 kg   Body mass index is 17.85 kg/m.   Gen Exam:Alert awake-not in any distress HEENT:atraumatic, normocephalic Chest: B/L clear to auscultation anteriorly CVS:S1S2 regular Abdomen:soft non tender, non distended Extremities:no edema Neurology: Non focal Skin: no rash  I have personally reviewed following labs and imaging studies  LABORATORY DATA: CBC: Recent Labs  Lab 09/06/20 2214 09/06/20 2308 09/07/20 0058 09/07/20 0448 09/08/20 0737  WBC 6.7  --   --  3.7* 8.2  HGB 14.2 14.6 11.9* 12.1* 11.7*  HCT 45.1 43.0 35.0* 37.1* 35.2*  MCV 100.2*  --   --  97.1 95.4  PLT 168  --   --  117* 121*     Basic Metabolic Panel: Recent Labs  Lab 09/06/20 2214 09/06/20 2308 09/07/20 0058 09/07/20 0448 09/08/20 0737  NA 139 138 141 139 138  K 5.8* 4.2 3.5 3.6 4.1  CL 103  --   --  102 102  CO2 26  --   --  24 27  GLUCOSE 177*  --   --  221* 101*  BUN 11  --   --  13 6*  CREATININE 0.83  --   --  1.04 0.74  CALCIUM 9.7  --   --  9.1 9.7  MG  --   --   --  2.3  --  PHOS  --   --   --  3.1  --     GFR: Estimated Creatinine Clearance: 70.6 mL/min (by C-G formula based on SCr of 0.74 mg/dL).  Liver Function Tests: Recent Labs  Lab 09/06/20 2214 09/07/20 0448 09/08/20 0737  AST 82* 87* 47*  ALT 26 31 26   ALKPHOS 97 82 67  BILITOT 1.5* 0.9 0.8  PROT 7.9 6.7 6.4*  ALBUMIN 4.2 3.5 3.4*   No results for input(s): LIPASE, AMYLASE in the last 168 hours. No results for input(s): AMMONIA in the last 168 hours.  Coagulation Profile: No results for input(s): INR, PROTIME in the last 168 hours.  Cardiac Enzymes: No results for input(s): CKTOTAL, CKMB, CKMBINDEX, TROPONINI in the last 168 hours.  BNP (last 3 results) No results for input(s): PROBNP in the last 8760 hours.  Lipid Profile: Recent Labs    09/07/20 0442  TRIG 57    Thyroid Function Tests: No results for input(s): TSH, T4TOTAL, FREET4, T3FREE, THYROIDAB in the last 72 hours.  Anemia Panel: No results for input(s): VITAMINB12, FOLATE, FERRITIN, TIBC, IRON, RETICCTPCT in the last 72 hours.  Urine analysis:    Component Value Date/Time   COLORURINE YELLOW 12/13/2019 1827   APPEARANCEUR CLEAR 12/13/2019 1827   LABSPEC 1.021 12/13/2019 1827   PHURINE 6.0 12/13/2019 1827   GLUCOSEU NEGATIVE 12/13/2019 1827   HGBUR NEGATIVE 12/13/2019 1827   BILIRUBINUR NEGATIVE 12/13/2019 1827   KETONESUR NEGATIVE 12/13/2019 1827   PROTEINUR NEGATIVE 12/13/2019 1827   UROBILINOGEN 0.2 01/06/2012 2031   NITRITE NEGATIVE 12/13/2019 1827   LEUKOCYTESUR NEGATIVE 12/13/2019 1827    Sepsis Labs: Lactic Acid, Venous     Component Value Date/Time   LATICACIDVEN 6.2 ([place]) 09/07/2020 0442    MICROBIOLOGY: Recent Results (from the past 240 hour(s))  Blood culture (routine x 2)     Status: None (Preliminary result)   Collection Time: 09/06/20 12:32 AM   Specimen: BLOOD  Result Value Ref Range Status   Specimen Description BLOOD RIGHT ANTECUBITAL  Final   Special Requests   Final    BOTTLES DRAWN AEROBIC AND ANAEROBIC Blood Culture adequate volume   Culture   Final    NO GROWTH < 12 HOURS Performed at [facility], [place] [address] [place], [place] [address]    Report Status PENDING  Incomplete  Respiratory Panel by RT PCR (Flu A&B, Covid) - Nasopharyngeal Swab     Status: None   Collection Time: 09/06/20 10:07 PM   Specimen: Nasopharyngeal Swab  Result Value Ref Range Status   SARS Coronavirus 2 by RT PCR NEGATIVE NEGATIVE Final    Comment: (NOTE) SARS-CoV-2 target nucleic acids are NOT DETECTED.  The SARS-CoV-2 RNA is generally detectable in upper respiratoy specimens during the acute phase of infection. The lowest concentration of SARS-CoV-2 viral copies this assay can detect is 131 copies/mL. A negative result does not preclude SARS-Cov-2 infection and should not be used as the sole basis for treatment or other patient management decisions. A negative result may occur with  improper specimen collection/handling, submission of specimen other than nasopharyngeal swab, presence of viral mutation(s) within the areas targeted by this assay, and inadequate number of viral copies (<131 copies/mL). A negative result must be combined with clinical observations, patient history, and epidemiological information. The expected result is Negative.  Fact Sheet for Patients:  PinkCheek.be  Fact Sheet for Healthcare Providers:  GravelBags.it  This test is no t yet approved or cleared by the [place]  States FDA and  has been authorized for  detection and/or diagnosis of SARS-CoV-2 by FDA under an Emergency Use Authorization (EUA). This EUA will remain  in effect (meaning this test can be used) for the duration of the COVID-19 declaration under Section 564(b)(1) of the Act, 21 U.S.C. section 360bbb-3(b)(1), unless the authorization is terminated or revoked sooner.     Influenza A by PCR NEGATIVE NEGATIVE Final   Influenza B by PCR NEGATIVE NEGATIVE Final    Comment: (NOTE) The Xpert Xpress SARS-CoV-2/FLU/RSV assay is intended as an aid in  the diagnosis of influenza from Nasopharyngeal swab specimens and  should not be used as a sole basis for treatment. Nasal washings and  aspirates are unacceptable for Xpert Xpress SARS-CoV-2/FLU/RSV  testing.  Fact Sheet for Patients: PinkCheek.be  Fact Sheet for Healthcare Providers: GravelBags.it  This test is not yet approved or cleared by the [place] FDA and  has been authorized for detection and/or diagnosis of SARS-CoV-2 by  FDA under an Emergency Use Authorization (EUA). This EUA will remain  in effect (meaning this test can be used) for the duration of the  Covid-19 declaration under Section 564(b)(1) of the Act, 21  U.S.C. section 360bbb-3(b)(1), unless the authorization is  terminated or revoked. Performed at [facility], [place] [address] [place], [place] [address]   Blood culture (routine x 2)     Status: None (Preliminary result)   Collection Time: 09/06/20 11:53 PM   Specimen: BLOOD RIGHT HAND  Result Value Ref Range Status   Specimen Description BLOOD RIGHT HAND  Final   Special Requests   Final    BOTTLES DRAWN AEROBIC AND ANAEROBIC Blood Culture results may not be optimal due to an inadequate volume of blood received in culture bottles   Culture   Final    NO GROWTH < 12 HOURS Performed at [facility], [place] [address] [place], [place] [address]    Report Status PENDING  Incomplete    MRSA PCR Screening     Status: None   Collection Time: 09/07/20  1:30 AM   Specimen: Nasopharyngeal  Result Value Ref Range Status   MRSA by PCR NEGATIVE NEGATIVE Final    Comment:        The GeneXpert MRSA Assay (FDA approved for NASAL specimens only), is one component of a comprehensive MRSA colonization surveillance program. It is not intended to diagnose MRSA infection nor to guide or monitor treatment for MRSA infections. Performed at [facility], [place] [address] [place], [place] [address]   Culture, respiratory (non-expectorated)     Status: None (Preliminary result)   Collection Time: 09/07/20  2:33 AM   Specimen: Tracheal Aspirate; Respiratory  Result Value Ref Range Status   Specimen Description TRACHEAL ASPIRATE  Final   Special Requests NONE  Final   Gram Stain   Final    FEW WBC PRESENT, PREDOMINANTLY PMN MODERATE GRAM POSITIVE COCCI IN PAIRS IN CHAINS    Culture   Final    CULTURE REINCUBATED FOR BETTER GROWTH Performed at [facility], [place] [address], [place], [place] [address]    Report Status PENDING  Incomplete    RADIOLOGY STUDIES/RESULTS: DG Chest Port 1 View  Result Date: 09/06/2020 CLINICAL DATA:  69 year old male with respiratory distress. EXAM: PORTABLE CHEST 1 VIEW COMPARISON:  Chest radiograph dated 05/04/2020 and CT dated 07/24/2020. FINDINGS: Endotracheal tube with suboptimally visualized tip due to overlying sternal wires but appears above the  carina. Enteric tube extends below the diaphragm with side-port just distal to the GE junction and tip beyond the inferior margin of the image. There is background of emphysema. There is flattening of the diaphragms with blunting of the costophrenic angles which may be related to hyperexpansion or scarring. Small pleural effusions are not excluded clinical correlation is recommended. Right upper lobe interstitial coarsening and nodularity may be chronic although developing infiltrate is not  excluded. No pneumothorax identified. The cardiac silhouette is within limits. Median sternotomy wires and CABG vascular clips. Old healed right rib fracture deformities. No acute osseous pathology. IMPRESSION: 1. Endotracheal tube appears above the carina. 2. Emphysema. 3. Right upper lobe interstitial coarsening and nodularity may be chronic although developing infiltrate is not excluded. Electronically Signed   By: Anner Crete M.D.   On: 09/06/2020 22:52     LOS: 1 day   Oren Binet, MD  [facility]    To contact the attending provider between 7A-7P or the covering provider during after hours 7P-7A, please log into the web site [email or web address] and access using universal [place] password for that web site. If you do not have the password, please call the hospital operator.  09/08/2020, 11:36 AM

## 2020-09-08 NOTE — Progress Notes (Signed)
Initial Nutrition Assessment  DOCUMENTATION CODES:   Severe malnutrition in context of chronic illness, Underweight  INTERVENTION:   Ensure Enlive po TID, each supplement provides 350 kcal and 20 grams of protein  Continue MVI with Minerals daily  NUTRITION DIAGNOSIS:   Severe Malnutrition related to chronic illness as evidenced by severe muscle depletion, severe fat depletion.  GOAL:   Patient will meet greater than or equal to 90% of their needs  MONITOR:   PO intake, Supplement acceptance, Labs, Weight trends  REASON FOR ASSESSMENT:   Malnutrition Screening Tool    ASSESSMENT:   69 yo male admitted with acute on chronic respiratory failure with  COPD exacerbation. PMH includes CAD, EtOH abuse,hx of RUL SCCs/p SBRT  11/14 Intubated 11/15 Extubated  Recorded po intake 10% of meal. Pt reports appetite is ok but does not like the food he is receiving and sometimes gets the "wrong food." Pt reports his wife cooks for him at home and he typically eats 1-2 meals per day. Breakfast is usually grits, eggs and shrimp and dinner varies but might include fried chicken and potatoes and green beans. Pt also reports she drinks Glucerna but frequency varies; he loves the way it taste but reports it is expensive. Noted per chart review, pt drinks daily, mostly beer, anywhere from 4-6 cans per day  Reports UBW around 145 pounds and reports he lost down to 120 pounds over unknown time frame. Current wt 128 pounds  Pt with poor dentition but denies any issues chewing or swallowing.   Labs: reviewed Meds: ss novolog,MVI with Minerals, prednisone, thiamine  Diet Order:   Diet Order            Diet regular Room service appropriate? Yes; Fluid consistency: Thin  Diet effective now                 EDUCATION NEEDS:   Education needs have been addressed  Skin:  Skin Assessment: Reviewed RN Assessment  Last BM:  PTA  Height:   Ht Readings from Last 1 Encounters:  09/06/20 5'  11" (1.803 m)    Weight:   Wt Readings from Last 1 Encounters:  09/08/20 58.1 kg    BMI:  Body mass index is 17.85 kg/m.  Estimated Nutritional Needs:   Kcal:  1800-2000 kcals  Protein:  90-100 g  Fluid:  >/= 1.8 L   Kerman Passey MS, RDN, LDN, CNSC Registered Dietitian III Clinical Nutrition RD Pager and On-Call Pager Number Located in Portal

## 2020-09-08 NOTE — Evaluation (Signed)
Physical Therapy Evaluation Patient Details Name: Lucas [name] MRN: [id number] DOB: 11-01-1950 Today's Date: 09/08/2020   History of Present Illness  Pt is a 69 y/o M presenting to the ED on 11/14 for sudden SOB and hypoxia. Pt was intubated on 11/14 and extubated 11/15. PMH includes moderate COPD, lung cancer, CAD s/p CABG, HTN, alcohol abuse, and unstable angina  Clinical Impression  Pt presents with a need for only modified independence-supervision during functional mobility. 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 Pt has significant self limiting factors, stating that he doesn't want to use DME and that he is 'never going to get stronger or breathe better'. Education on DME, strengthening, and endurance to help with self confidence would benefit this pt. Pt would benefit from acute PT to provide education on the above as well as work on LE strengthening, gait, and stair training. Pt would benefit from home health PT at d/c to increase confidence with integrating back into the community and improve household ambulation. Sitting in bed SpO2: 100% on 0L EOB vitals on 1L: 103 bpm and 97% SpO2 Ambulating SpO2 on 1L: 83-85% Ambulating SpO2 on 2L: 95-100% Resting SpO2 on 1L after mobility: 95%    Follow Up Recommendations Home health PT;Supervision/Assistance - 24 hour    Equipment Recommendations  Other (comment) (tub bench (pt wants to have wife order this))    Recommendations for Other Services OT consult     Precautions / Restrictions Precautions Precautions: Fall      Mobility  Bed Mobility Overal bed mobility: Modified Independent                  Transfers Overall transfer level: Needs assistance   Transfers: Sit to/from Stand Sit to Stand: Supervision         General transfer comment: supervision for lines to stand from EOB  Ambulation/Gait Ambulation/Gait assistance:  Supervision Gait Distance (Feet): 150 Feet Assistive device: None Gait Pattern/deviations: Step-through pattern;Decreased stride length;Wide base of support;Antalgic   Gait velocity interpretation: >2.62 ft/sec, indicative of community ambulatory General Gait Details: antalgic gait on the L side  Stairs            Wheelchair Mobility    Modified Rankin (Stroke Patients Only)       Balance Overall balance assessment: Independent                                           Pertinent Vitals/Pain Pain Assessment: 0-10 Pain Score: 10-Worst pain ever Pain Location: when coughing in the stomach Pain Descriptors / Indicators: Sharp;Guarding;Grimacing Pain Intervention(s): Monitored during session;Limited activity within patient's tolerance;Repositioned    Home Living Family/patient expects to be discharged to:: Private residence Living Arrangements: Spouse/significant other (wife) Available Help at Discharge: Family;Available 24 hours/day (wife) Type of Home: Apartment Home Access: Stairs to enter Entrance Stairs-Rails: Left Entrance Stairs-Number of Steps: 3 Home Layout: One level Home Equipment: Walker - 2 wheels;Cane - single point;Shower seat (doesnt use walker or cane because doesn't want it to be a 'habit') Additional Comments: has a shower seat but cant get into the tub because of the step in; does sponge baths because of this; doesn't want to use walker or cane because doesn't want it to become a 'habit'; uses oxygen but only at night or when he needs  it because he also doesn't want it to be a 'habit'    Prior Function Level of Independence: Independent         Comments: doesn't drive; wife does all the cooking and cleaning; doesn't go out into the community because he can't get around     Hand Dominance   Dominant Hand: Right    Extremity/Trunk Assessment   Upper Extremity Assessment Upper Extremity Assessment: Defer to OT evaluation     Lower Extremity Assessment Lower Extremity Assessment: Overall WFL for tasks assessed    Cervical / Trunk Assessment Cervical / Trunk Assessment: Normal  Communication   Communication: No difficulties  Cognition Arousal/Alertness: Awake/alert Behavior During Therapy: WFL for tasks assessed/performed Overall Cognitive Status: Within Functional Limits for tasks assessed                                        General Comments      Exercises     Assessment/Plan    PT Assessment Patient needs continued PT services  PT Problem List Decreased strength;Cardiopulmonary status limiting activity;Decreased safety awareness;Decreased activity tolerance;Decreased mobility       PT Treatment Interventions DME instruction;Stair training;Functional mobility training;Patient/family education;Therapeutic activities;Therapeutic exercise    PT Goals (Current goals can be found in the Care Plan section)  Acute Rehab PT Goals Patient Stated Goal: to go home PT Goal Formulation: With patient Time For Goal Achievement: 09/22/20 Potential to Achieve Goals: Good    Frequency Min 3X/week   Barriers to discharge        Co-evaluation               AM-PAC PT "6 Clicks" Mobility  Outcome Measure Help needed turning from your back to your side while in a flat bed without using bedrails?: A Little Help needed moving from lying on your back to sitting on the side of a flat bed without using bedrails?: A Little Help needed moving to and from a bed to a chair (including a wheelchair)?: A Little Help needed standing up from a chair using your arms (e.g., wheelchair or bedside chair)?: A Little Help needed to walk in hospital room?: A Little Help needed climbing 3-5 steps with a railing? : A Little 6 Click Score: 18    End of Session Equipment Utilized During Treatment: Gait belt Activity Tolerance: Patient tolerated treatment well Patient left: in bed;with bed alarm  set;with call bell/phone within reach        Time: 2025-4270 PT Time Calculation (min) (ACUTE ONLY): 32 min   Charges:   PT Evaluation $PT Eval Moderate Complexity: 1 Mod PT Treatments $Gait Training: 8-22 mins        Caleb Popp, SPT [id number]  Milton Jon 09/08/2020, 11:59 AM

## 2020-09-08 NOTE — Progress Notes (Signed)
PT Cancellation Note  Patient Details Name: Lucas [name] MRN: [id number] DOB: 1951-03-27   Cancelled Treatment:    Reason Eval/Treat Not Completed: Patient declined, no reason specified (pt reports pain, SOB and wanting to eat. Will reattempt)   [name] 09/08/2020, 7:49 AM Bayard Males, PT Acute Rehabilitation Services Pager: [phone] Office: [phone]

## 2020-09-08 NOTE — Progress Notes (Signed)
SATURATION QUALIFICATIONS: (This note is used to comply with regulatory documentation for home oxygen)  Patient Saturations on Room Air at Rest = 100%  Patient Saturations on Room Air while Ambulating = 87%  Patient Saturations on 2 Liters of oxygen while Ambulating = 95-100%  Please briefly explain why patient needs home oxygen: Pt desated on room air after ambulating to the door on no air. Pt needed to be turned up to 1L of O2 while ambulating 70 ft, but pt desated to 83-85%. Pt was able to maintain SpO2 levels between 95-100% on 2L while performing last 70 ft of ambulation   [place], [place] [id number]

## 2020-09-09 DIAGNOSIS — J441 Chronic obstructive pulmonary disease with (acute) exacerbation: Secondary | ICD-10-CM | POA: Diagnosis not present

## 2020-09-09 DIAGNOSIS — J9601 Acute respiratory failure with hypoxia: Secondary | ICD-10-CM | POA: Diagnosis not present

## 2020-09-09 DIAGNOSIS — C3411 Malignant neoplasm of upper lobe, right bronchus or lung: Secondary | ICD-10-CM | POA: Diagnosis not present

## 2020-09-09 DIAGNOSIS — E43 Unspecified severe protein-calorie malnutrition: Secondary | ICD-10-CM | POA: Insufficient documentation

## 2020-09-09 DIAGNOSIS — J9602 Acute respiratory failure with hypercapnia: Secondary | ICD-10-CM | POA: Diagnosis not present

## 2020-09-09 LAB — GLUCOSE, CAPILLARY
Glucose-Capillary: 101 mg/dL — ABNORMAL HIGH (ref 70–99)
Glucose-Capillary: 117 mg/dL — ABNORMAL HIGH (ref 70–99)
Glucose-Capillary: 132 mg/dL — ABNORMAL HIGH (ref 70–99)
Glucose-Capillary: 88 mg/dL (ref 70–99)
Glucose-Capillary: 95 mg/dL (ref 70–99)
Glucose-Capillary: 96 mg/dL (ref 70–99)

## 2020-09-09 LAB — CBC
HCT: 37.6 % — ABNORMAL LOW (ref 39.0–52.0)
Hemoglobin: 12.2 g/dL — ABNORMAL LOW (ref 13.0–17.0)
MCH: 31.9 pg (ref 26.0–34.0)
MCHC: 32.4 g/dL (ref 30.0–36.0)
MCV: 98.4 fL (ref 80.0–100.0)
Platelets: 113 10*3/uL — ABNORMAL LOW (ref 150–400)
RBC: 3.82 MIL/uL — ABNORMAL LOW (ref 4.22–5.81)
RDW: 13.7 % (ref 11.5–15.5)
WBC: 6.8 10*3/uL (ref 4.0–10.5)
nRBC: 0 % (ref 0.0–0.2)

## 2020-09-09 LAB — COMPREHENSIVE METABOLIC PANEL
ALT: 24 U/L (ref 0–44)
AST: 44 U/L — ABNORMAL HIGH (ref 15–41)
Albumin: 3.3 g/dL — ABNORMAL LOW (ref 3.5–5.0)
Alkaline Phosphatase: 70 U/L (ref 38–126)
Anion gap: 7 (ref 5–15)
BUN: 11 mg/dL (ref 8–23)
CO2: 30 mmol/L (ref 22–32)
Calcium: 9.7 mg/dL (ref 8.9–10.3)
Chloride: 99 mmol/L (ref 98–111)
Creatinine, Ser: 0.81 mg/dL (ref 0.61–1.24)
GFR, Estimated: >60 mL/min (ref >60)
Glucose, Bld: 99 mg/dL (ref 70–99)
Potassium: 4.3 mmol/L (ref 3.5–5.1)
Sodium: 136 mmol/L (ref 135–145)
Total Bilirubin: 0.6 mg/dL (ref 0.3–1.2)
Total Protein: 6.4 g/dL — ABNORMAL LOW (ref 6.5–8.1)

## 2020-09-09 LAB — CULTURE, RESPIRATORY W GRAM STAIN: Culture: NORMAL

## 2020-09-09 LAB — MAGNESIUM: Magnesium: 2 mg/dL (ref 1.7–2.4)

## 2020-09-09 MED ORDER — METOPROLOL TARTRATE 12.5 MG HALF TABLET
12.5000 mg | ORAL_TABLET | Freq: Two times a day (BID) | ORAL | Status: DC
  Administered 2020-09-09 – 2020-09-10 (×3): 12.5 mg via ORAL
  Filled 2020-09-09 (×3): qty 1

## 2020-09-09 NOTE — Evaluation (Signed)
Occupational Therapy Evaluation Patient Details Name: Lucas [name] MRN: [id number] DOB: [birth year] Today's Date: 09/09/2020    History of Present Illness Pt is a 69 y/o M presenting to the ED on 11/14 for sudden SOB and hypoxia. Pt was intubated on 11/14 and extubated 11/15. PMH includes moderate COPD, lung cancer, CAD s/p CABG, HTN, alcohol abuse, and unstable angina   Clinical Impression   Pt reports that he is independent at home prior to admission, that he does not drive and he has a SPC and a RW but doesn't like to use them so that he doesn't develop "bad habits" he does not take showers, and prefers baths (sponge bathing currently). He does not drive and wife performs IADL. Today during session he is pleasant and likes to joke around. He is mod I for bed mobility, able to don/doff socks at EOB without assist, perform sit<>stand transfers with min guard for line management. He gets DOE 2/4 with activity and HR elevated from 110 up to 128 at points during session. Pt educated on pursed lip breathing and energy conservation strategies. Today Pt did not want to use RW but did use the furniture for balance. OT will follow acutely and HHOT is essential with focus on bathing routine, energy conservation and management of SpO2.     Follow Up Recommendations  Home health OT    Equipment Recommendations  Tub/shower bench    Recommendations for Other Services       Precautions / Restrictions Precautions Precautions: Fall Restrictions Weight Bearing Restrictions: No      Mobility Bed Mobility Overal bed mobility: Modified Independent                  Transfers Overall transfer level: Needs assistance Equipment used: None (support from furniture) Transfers: Sit to/from Stand Sit to Stand: Min guard         General transfer comment: min guard for line management    Balance Overall balance assessment: Independent                                          ADL either performed or assessed with clinical judgement   ADL Overall ADL's : Needs assistance/impaired Eating/Feeding: Modified independent   Grooming: Min guard;Standing Grooming Details (indicate cue type and reason): DOE 2/4 with activity, able to stand and balance, at times leaning against sink (small sink in 49M)  Upper Body Bathing: Set up;Sitting Upper Body Bathing Details (indicate cue type and reason): typically sponge bathes Lower Body Bathing: Min guard;Sitting/lateral leans Lower Body Bathing Details (indicate cue type and reason): typically sponge bathes and REALLY likes to take hot baths Upper Body Dressing : Set up;Sitting   Lower Body Dressing: Min guard;Sit to/from stand Lower Body Dressing Details (indicate cue type and reason): able to don/doff sock EOB without difficulty Toilet Transfer: Min guard;Ambulation Toilet Transfer Details (indicate cue type and reason): reachs out for support from furniture Toileting- Water quality scientist and Hygiene: Min guard;Sit to/from stand       Functional mobility during ADLs: Min guard (no DME this session) General ADL Comments: focused a lot on energy conservation, breathing strategies     Vision Patient Visual Report: No change from baseline       Perception     Praxis      Pertinent Vitals/Pain Pain Assessment: No/denies pain Pain Intervention(s): Monitored during session  Hand Dominance Right   Extremity/Trunk Assessment Upper Extremity Assessment Upper Extremity Assessment: Overall WFL for tasks assessed   Lower Extremity Assessment Lower Extremity Assessment: Defer to PT evaluation   Cervical / Trunk Assessment Cervical / Trunk Assessment: Normal   Communication Communication Communication: No difficulties   Cognition Arousal/Alertness: Awake/alert Behavior During Therapy: WFL for tasks assessed/performed Overall Cognitive Status: No family/caregiver present to determine baseline cognitive  functioning Area of Impairment: Safety/judgement;Awareness                         Safety/Judgement: Decreased awareness of safety;Decreased awareness of deficits Awareness: Emergent   General Comments: suspect Pt at baseline, Pt with safety deficits as he doesn't want to use DME for balance/energy conservation/safety   General Comments  HR elevated 109-128 throughout session (varied between those limits)    Exercises     Shoulder Instructions      Home Living Family/patient expects to be discharged to:: Private residence Living Arrangements: Spouse/significant other Available Help at Discharge: Family;Available 24 hours/day Type of Home: Apartment Home Access: Stairs to enter Entrance Stairs-Number of Steps: 3 Entrance Stairs-Rails: Left Home Layout: One level     Bathroom Shower/Tub: Tub/shower unit (Pt typically uses a tin tub and likes BATHS not shower)   Bathroom Toilet: Handicapped height Bathroom Accessibility: Yes How Accessible: Accessible via walker Home Equipment: [place] - 2 wheels;Cane - single point;Shower seat (does not like to use DME)   Additional Comments: has a shower seat but cant get into the tub because of the step in; does sponge baths because of this; doesn't want to use walker or cane because doesn't want it to become a 'habit'; uses oxygen but only at night or when he needs it because he also doesn't want it to be a 'habit'      Prior Functioning/Environment Level of Independence: Independent        Comments: doesn't drive; wife does all the cooking and cleaning; doesn't go out into the community because he can't get around        OT Problem List: Decreased activity tolerance;Impaired balance (sitting and/or standing);Decreased safety awareness;Cardiopulmonary status limiting activity      OT Treatment/Interventions: Self-care/ADL training;Energy conservation;DME and/or AE instruction;Therapeutic activities;Patient/family  education;Balance training    OT Goals(Current goals can be found in the care plan section) Acute Rehab OT Goals Patient Stated Goal: to go home OT Goal Formulation: With patient Time For Goal Achievement: 09/23/20 Potential to Achieve Goals: Good ADL Goals Pt Will Perform Grooming: with modified independence;standing Pt Will Transfer to Toilet: with modified independence;ambulating Pt Will Perform Toileting - Clothing Manipulation and hygiene: with modified independence;sit to/from stand Additional ADL Goal #1: Pt will verbalize 3 ways of conserving energy during ADL with no verbal cues Additional ADL Goal #2: Pt will self-montior oxygen/HR throughout ADL session and use supplemental O2 appropriately keeping SpO2 >90%  OT Frequency: Min 2X/week   Barriers to D/C:            Co-evaluation              AM-PAC OT "6 Clicks" Daily Activity     Outcome Measure Help from another person eating meals?: None Help from another person taking care of personal grooming?: A Little Help from another person toileting, which includes using toliet, bedpan, or urinal?: A Little Help from another person bathing (including washing, rinsing, drying)?: A Little Help from another person to put on and taking off regular upper  body clothing?: None Help from another person to put on and taking off regular lower body clothing?: A Little 6 Click Score: 20   End of Session Equipment Utilized During Treatment: Gait belt;Oxygen Nurse Communication: Mobility status  Activity Tolerance: Patient tolerated treatment well Patient left: in bed;with call bell/phone within reach;with bed alarm set;with SCD's reapplied  OT Visit Diagnosis: Unsteadiness on feet (R26.81);Other abnormalities of gait and mobility (R26.89);Other symptoms and signs involving cognitive function                Time: 5284-1324 OT Time Calculation (min): 27 min Charges:  OT General Charges $OT Visit: 1 Visit OT Evaluation $OT Eval  Moderate Complexity: 1 Mod OT Treatments $Self Care/Home Management : 8-22 mins  Jesse Sans OTR/L Acute Rehabilitation Services Pager: [phone] Office: [place] 09/09/2020, 12:20 PM

## 2020-09-09 NOTE — Progress Notes (Signed)
PROGRESS NOTE        PATIENT DETAILS Name: Lucas [name] Age: 69 y.o. Sex: male Date of Birth: 05-22-1951 Admit Date: 09/06/2020 Admitting Physician Adam Sharene Butters, MD FBP:ZWCHENI, Ronnette Juniper, DO  Brief Narrative: Patient is a 69 y.o. 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  Initially managed in the ICU-extubated-and transferred to the [facility].  See below for further details.  Significant events: 11/14>> presented to the ED with severe shortness of breath-extremities-intubated-admitted by PCCM. 11/15>> extubated 11/16>> transfer to [facility].  Significant studies: 11/14>> chest x-ray: Right upper lobe interstitial coarsening and nodularity  Antimicrobial therapy: Rocephin: 11/14>> Zithromax: 11/14>>  Microbiology data: 11/14>> blood culture: No growth 11/15>> tracheal aspirate: Normal respiratory flora.  Procedures : None  Consults: PCCM  DVT Prophylaxis : enoxaparin (LOVENOX) injection 40 mg Start: 09/07/20 1400  Subjective: Feels better-breathing better-no major issues overnight.  Assessment/Plan: Acute on chronic hypoxic and hypercarbic respiratory failure due to COPD exacerbation: Significantly improved-moving air well-no rhonchi-continue tapering steroids-Rocephin/Zithromax and inhaler/bronchodilator regimen.  Normally on 3 L of oxygen which he mostly uses at night  Transaminitis: Downtrending-could have had mild alcoholic hepatitis.  History of alcohol abuse: No alcohol withdrawal symptoms-on Ativan per CIWA protocol. Per spouse-drinks daily-mostly beer-anywhere from 4-6 cans a day.  CAD: Stable-continue aspirin/statin-restart metoprolol.  Tobacco Abuse: Counseled  History of RUL SCC-s/p SBRT: Continue outpatient follow-up with radiation oncology  and medical oncology.  Debility/deconditioning: Appreciate rehab services eval-plans are for home health on discharge.  Severe Malnutrition   Diet: Diet Order            Diet regular Room service appropriate? Yes; Fluid consistency: Thin  Diet effective now                  Code Status: Full code   Family Communication: Spoke with spouse [name] [phone] ) over phone on 11/17  Disposition Plan: Status is: Inpatient  Remains inpatient appropriate because:Inpatient level of care appropriate due to severity of illness   Dispo: The patient is from: Home              Anticipated d/c is to: TBD              Anticipated d/c date is: 1-2 days              Patient currently is not medically stable to d/c.   Barriers to Discharge: COPD exacerbation-s/p extubation on 11/15-needs another 1-2 days of optimization before discharge with home health services.  Antimicrobial agents: Anti-infectives (From admission, onward)   Start     Dose/Rate Route Frequency Ordered Stop   09/08/20 0000  azithromycin (ZITHROMAX) 500 mg in sodium chloride 0.9 % 250 mL IVPB        500 mg 250 mL/hr over 60 Minutes Intravenous Every 24 hours 09/07/20 0126 09/09/20 0038   09/07/20 2200  cefTRIAXone (ROCEPHIN) 1 g in sodium chloride 0.9 % 100 mL IVPB        1 g 200 mL/hr over 30 Minutes Intravenous Every 24 hours 09/07/20 0126 09/11/20 2159   09/07/20 0000  cefTRIAXone (ROCEPHIN) 1 g in sodium chloride 0.9 % 100 mL IVPB  1 g 200 mL/hr over 30 Minutes Intravenous  Once 09/06/20 2354 09/07/20 0055   09/07/20 0000  azithromycin (ZITHROMAX) 500 mg in sodium chloride 0.9 % 250 mL IVPB        500 mg 250 mL/hr over 60 Minutes Intravenous  Once 09/06/20 2354 09/07/20 0138       Time spent: 25 minutes-Greater than 50% of this time was spent in counseling, explanation of diagnosis, planning of further management, and coordination of care.  MEDICATIONS: Scheduled Meds: . arformoterol  15 mcg  Nebulization BID  . aspirin  81 mg Oral Daily  . budesonide (PULMICORT) nebulizer solution  0.25 mg Nebulization BID  . Chlorhexidine Gluconate Cloth  6 each Topical Daily  . enoxaparin (LOVENOX) injection  40 mg Subcutaneous Q24H  . famotidine  20 mg Oral BID  . feeding supplement  237 mL Oral TID BM  . folic acid  1 mg Oral Daily  . insulin aspart  0-9 Units Subcutaneous Q4H  . ipratropium-albuterol  3 mL Nebulization TID  . mouth rinse  15 mL Mouth Rinse BID  . multivitamin with minerals  1 tablet Oral Daily  . predniSONE  40 mg Oral Daily  . rosuvastatin  40 mg Oral Daily  . thiamine  100 mg Oral Daily   Or  . thiamine  100 mg Intravenous Daily   Continuous Infusions: . sodium chloride 10 mL/hr at 09/07/20 1025  . cefTRIAXone (ROCEPHIN)  IV Stopped (09/08/20 2132)   PRN Meds:.acetaminophen, docusate, LORazepam **OR** LORazepam, nitroGLYCERIN, ondansetron, polyethylene glycol   PHYSICAL EXAM: Vital signs: Vitals:   09/09/20 0600 09/09/20 0635 09/09/20 0700 09/09/20 0831  BP:  127/81    Pulse: 83 90    Resp:      Temp:   98.1 F (36.7 C)   TempSrc:   Oral   SpO2: 100% 100%  100%  Weight:      Height:       Filed Weights   09/07/20 0353 09/08/20 0311 09/09/20 0307  Weight: 58.1 kg 58.1 kg 58.1 kg   Body mass index is 17.85 kg/m.   Gen Exam:Alert awake-not in any distress HEENT:atraumatic, normocephalic Chest: B/L clear to auscultation anteriorly CVS:S1S2 regular Abdomen:soft non tender, non distended Extremities:no edema Neurology: Non focal Skin: no rash  I have personally reviewed following labs and imaging studies  LABORATORY DATA: CBC: Recent Labs  Lab 09/06/20 2214 09/06/20 2308 09/07/20 0058 09/07/20 0448 09/08/20 0737 09/08/20 1205 09/09/20 0422  WBC 6.7  --   --  3.7* 8.2 7.9 6.8  HGB 14.2   < > 11.9* 12.1* 11.7* 12.1* 12.2*  HCT 45.1   < > 35.0* 37.1* 35.2* 36.9* 37.6*  MCV 100.2*  --   --  97.1 95.4 96.9 98.4  PLT 168  --   --  117*  121* 125* 113*   < > = values in this interval not displayed.    Basic Metabolic Panel: Recent Labs  Lab 09/06/20 2214 09/06/20 2308 09/07/20 0058 09/07/20 0448 09/08/20 0737 09/08/20 1205 09/09/20 0422  NA 139   < > 141 139 138 138 136  K 5.8*   < > 3.5 3.6 4.1 4.2 4.3  CL 103  --   --  102 102 101 99  CO2 26  --   --  24 27 27 30   GLUCOSE 177*  --   --  221* 101* 120* 99  BUN 11  --   --  13 6* 8 11  CREATININE 0.83  --   --  1.04 0.74 0.97 0.81  CALCIUM 9.7  --   --  9.1 9.7 9.8 9.7  MG  --   --   --  2.3  --  1.9 2.0  PHOS  --   --   --  3.1  --  3.5  --    < > = values in this interval not displayed.    GFR: Estimated Creatinine Clearance: 69.7 mL/min (by C-G formula based on SCr of 0.81 mg/dL).  Liver Function Tests: Recent Labs  Lab 09/06/20 2214 09/07/20 0448 09/08/20 0737 09/08/20 1205 09/09/20 0422  AST 82* 87* 47* 53* 44*  ALT 26 31 26 28 24   ALKPHOS 97 82 67 68 70  BILITOT 1.5* 0.9 0.8 0.8 0.6  PROT 7.9 6.7 6.4* 6.6 6.4*  ALBUMIN 4.2 3.5 3.4* 3.6 3.3*   No results for input(s): LIPASE, AMYLASE in the last 168 hours. No results for input(s): AMMONIA in the last 168 hours.  Coagulation Profile: No results for input(s): INR, PROTIME in the last 168 hours.  Cardiac Enzymes: No results for input(s): CKTOTAL, CKMB, CKMBINDEX, TROPONINI in the last 168 hours.  BNP (last 3 results) No results for input(s): PROBNP in the last 8760 hours.  Lipid Profile: Recent Labs    09/07/20 0442  TRIG 57    Thyroid Function Tests: No results for input(s): TSH, T4TOTAL, FREET4, T3FREE, THYROIDAB in the last 72 hours.  Anemia Panel: No results for input(s): VITAMINB12, FOLATE, FERRITIN, TIBC, IRON, RETICCTPCT in the last 72 hours.  Urine analysis:    Component Value Date/Time   COLORURINE YELLOW 12/13/2019 1827   APPEARANCEUR CLEAR 12/13/2019 1827   LABSPEC 1.021 12/13/2019 1827   PHURINE 6.0 12/13/2019 1827   GLUCOSEU NEGATIVE 12/13/2019 1827   HGBUR  NEGATIVE 12/13/2019 1827   BILIRUBINUR NEGATIVE 12/13/2019 1827   KETONESUR NEGATIVE 12/13/2019 1827   PROTEINUR NEGATIVE 12/13/2019 1827   UROBILINOGEN 0.2 01/06/2012 2031   NITRITE NEGATIVE 12/13/2019 1827   LEUKOCYTESUR NEGATIVE 12/13/2019 1827    Sepsis Labs: Lactic Acid, Venous    Component Value Date/Time   LATICACIDVEN 6.2 ([place]) 09/07/2020 0442    MICROBIOLOGY: Recent Results (from the past 240 hour(s))  Blood culture (routine x 2)     Status: None (Preliminary result)   Collection Time: 09/06/20 12:32 AM   Specimen: BLOOD  Result Value Ref Range Status   Specimen Description BLOOD RIGHT ANTECUBITAL  Final   Special Requests   Final    BOTTLES DRAWN AEROBIC AND ANAEROBIC Blood Culture adequate volume   Culture   Final    NO GROWTH 1 DAY Performed at [facility], [place] [address] [place], [place] [address]    Report Status PENDING  Incomplete  Respiratory Panel by RT PCR (Flu A&B, Covid) - Nasopharyngeal Swab     Status: None   Collection Time: 09/06/20 10:07 PM   Specimen: Nasopharyngeal Swab  Result Value Ref Range Status   SARS Coronavirus 2 by RT PCR NEGATIVE NEGATIVE Final    Comment: (NOTE) SARS-CoV-2 target nucleic acids are NOT DETECTED.  The SARS-CoV-2 RNA is generally detectable in upper respiratoy specimens during the acute phase of infection. The lowest concentration of SARS-CoV-2 viral copies this assay can detect is 131 copies/mL. A negative result does not preclude SARS-Cov-2 infection and should not be used as the sole basis for treatment or other patient management decisions. A negative result may occur with  improper specimen collection/handling,  submission of specimen other than nasopharyngeal swab, presence of viral mutation(s) within the areas targeted by this assay, and inadequate number of viral copies (<131 copies/mL). A negative result must be combined with clinical observations, patient history, and epidemiological information.  The expected result is Negative.  Fact Sheet for Patients:  PinkCheek.be  Fact Sheet for Healthcare Providers:  GravelBags.it  This test is no t yet approved or cleared by the [place] FDA and  has been authorized for detection and/or diagnosis of SARS-CoV-2 by FDA under an Emergency Use Authorization (EUA). This EUA will remain  in effect (meaning this test can be used) for the duration of the COVID-19 declaration under Section 564(b)(1) of the Act, 21 U.S.C. section 360bbb-3(b)(1), unless the authorization is terminated or revoked sooner.     Influenza A by PCR NEGATIVE NEGATIVE Final   Influenza B by PCR NEGATIVE NEGATIVE Final    Comment: (NOTE) The Xpert Xpress SARS-CoV-2/FLU/RSV assay is intended as an aid in  the diagnosis of influenza from Nasopharyngeal swab specimens and  should not be used as a sole basis for treatment. Nasal washings and  aspirates are unacceptable for Xpert Xpress SARS-CoV-2/FLU/RSV  testing.  Fact Sheet for Patients: PinkCheek.be  Fact Sheet for Healthcare Providers: GravelBags.it  This test is not yet approved or cleared by the [place] FDA and  has been authorized for detection and/or diagnosis of SARS-CoV-2 by  FDA under an Emergency Use Authorization (EUA). This EUA will remain  in effect (meaning this test can be used) for the duration of the  Covid-19 declaration under Section 564(b)(1) of the Act, 21  U.S.C. section 360bbb-3(b)(1), unless the authorization is  terminated or revoked. Performed at [facility], [place] [address], [place], [place] [address]   Blood culture (routine x 2)     Status: None (Preliminary result)   Collection Time: 09/06/20 11:53 PM   Specimen: BLOOD RIGHT HAND  Result Value Ref Range Status   Specimen Description BLOOD RIGHT HAND  Final   Special Requests   Final    BOTTLES DRAWN  AEROBIC AND ANAEROBIC Blood Culture results may not be optimal due to an inadequate volume of blood received in culture bottles   Culture   Final    NO GROWTH 1 DAY Performed at [facility], [place] [address] [place], [place] [address]    Report Status PENDING  Incomplete  MRSA PCR Screening     Status: None   Collection Time: 09/07/20  1:30 AM   Specimen: Nasopharyngeal  Result Value Ref Range Status   MRSA by PCR NEGATIVE NEGATIVE Final    Comment:        The GeneXpert MRSA Assay (FDA approved for NASAL specimens only), is one component of a comprehensive MRSA colonization surveillance program. It is not intended to diagnose MRSA infection nor to guide or monitor treatment for MRSA infections. Performed at [facility], [place] [address] [place],  [address]   Culture, respiratory (non-expectorated)     Status: None (Preliminary result)   Collection Time: 09/07/20  2:33 AM   Specimen: Tracheal Aspirate; Respiratory  Result Value Ref Range Status   Specimen Description TRACHEAL ASPIRATE  Final   Special Requests NONE  Final   Gram Stain   Final    FEW WBC PRESENT, PREDOMINANTLY PMN MODERATE GRAM POSITIVE COCCI IN PAIRS IN CHAINS    Culture   Final    CULTURE REINCUBATED FOR BETTER GROWTH Performed at [facility], [place] [address], [place], [place] [address]    Report Status PENDING  Incomplete    RADIOLOGY STUDIES/RESULTS: No results found.   LOS: 2 days   Oren Binet, MD  [facility]    To contact the attending provider between 7A-7P or the covering provider during after hours 7P-7A, please log into the web site [email or web address] and access using universal [place] password for that web site. If you do not have the password, please call the hospital operator.  09/09/2020, 8:37 AM

## 2020-09-09 NOTE — Plan of Care (Signed)
  Problem: Health Behavior/Discharge Planning: Goal: Ability to manage health-related needs will improve Outcome: Progressing   Problem: Clinical Measurements: Goal: Will remain free from infection Outcome: Progressing Goal: Diagnostic test results will improve Outcome: Progressing   Problem: Activity: Goal: Risk for activity intolerance will decrease Outcome: Progressing   Problem: Pain Managment: Goal: General experience of comfort will improve Outcome: Progressing

## 2020-09-09 NOTE — TOC Initial Note (Signed)
Transition of Care [facility]) - Initial/Assessment Note    Patient Details  Name: Lucas [name] MRN: [id number] Date of Birth: 18-May-1951  Transition of Care [facility]) CM/SW Contact:    Bartholomew Crews, RN Phone Number: [phone] 09/09/2020, 12:53 PM  Clinical Narrative:                  Spoke with patient at the bedside. PTA home with spouse. Stated that he has a walker, but he doesn't want to become dependent on it. Encouraged him to use for safety while he is recovering. Discussed recommendations for [facility] PT, patient declined stating he doesn't think he needs it. Offered outpatient PT, but patient also declined.   Patient gets his oxygen from [employer]. Stated that he is wanting to get smaller lighter tanks. Spoke with wife with patient's consent, and she stated that there are issues with refilling tanks. NCM notified [employer] for follow up and advised that a tank will be needed for discharged.   Discussed transportation home - spouse stated that this will not be a problem.   Spouse also made aware of patient declining [place] services.   PCP and preferred pharmacy verified in [place].   TOC following for transition needs.   Expected Discharge Plan: Home/Self Care     Patient Goals and CMS Choice Patient states their goals for this hospitalization and ongoing recovery are:: home with wife CMS Medicare.gov Compare Post Acute Care list provided to:: Patient Choice offered to / list presented to : Patient  Expected Discharge Plan and Services Expected Discharge Plan: Home/Self Care   Discharge Planning Services: CM Consult Post Acute Care Choice: [place] arrangements for the past 2 months: Apartment                 DME Arranged: N/A DME Agency: NA       HH Arranged: Refused [place] Agency: NA        Prior Living Arrangements/Services Living arrangements for the past 2 months: Apartment Lives with:: Self, Spouse Patient language and need for interpreter reviewed::  Yes Do you feel safe going back to the place where you live?: Yes      Need for Family Participation in Patient Care: Yes (Comment) Care giver support system in place?: Yes (comment) Current home services: DME (walker) Criminal Activity/Legal Involvement Pertinent to Current Situation/Hospitalization: No - Comment as needed  Activities of Daily Living      Permission Sought/Granted Permission sought to share information with : Family Supports Permission granted to share information with : Yes, Verbal Permission Granted  Share Information with NAME: Peggie Aker     Permission granted to share info w Relationship: wife  Permission granted to share info w Contact Information: [phone]  Emotional Assessment Appearance:: Appears stated age Attitude/Demeanor/Rapport: Engaged Affect (typically observed): Accepting Orientation: : Oriented to Self, Oriented to Place, Oriented to  Time, Oriented to Situation Alcohol / Substance Use: Not Applicable Psych Involvement: No (comment)  Admission diagnosis:  COPD exacerbation (HCC) [J44.1] COPD with acute exacerbation (HCC) [J44.1] Malignant neoplasm of upper lobe of right lung ([place]) [C34.11] Acute respiratory failure with hypoxia and hypercapnia ([place]) [J96.01, J96.02] Patient Active Problem List   Diagnosis Date Noted  . COPD with acute exacerbation ([place]) 09/07/2020  . Malignant neoplasm of right upper lobe of lung ([place]) 05/26/2020  . Lung mass 05/04/2020  . Acute on chronic respiratory failure with hypoxia ([place]) 05/02/2020  . Pulmonary nodule 02/10/2020  . Pleural effusion, right   .  S/P CABG x 4 12/16/2019  . Coronary artery disease 12/16/2019  . Unstable angina ([place]) 12/12/2019  . NSTEMI (non-ST elevated myocardial infarction) ([place]) 12/12/2019  . CAD (coronary artery disease) 07/11/2018  . H. pylori infection 07/03/2018  . Chronic stable angina ([place]) 07/03/2018  . Duodenal ulcer due to Helicobacter pylori   . Bilateral AVN of  femurs ([place]) 06/25/2018  . Chronic cough 06/25/2018  . Mallory-Weiss tear   . Gastritis and gastroduodenitis   . Cirrhosis of liver ([place])   . Tubular adenoma of colon 01/24/2017  . Epistaxis 01/01/2015  . COPD exacerbation ([place]) 02/13/2013  . Ganglion cyst 01/03/2012  . PERIPHERAL NEUROPATHY 07/15/2010  . Alcohol use disorder, severe, dependence ([place]) 05/14/2009  . DEFORMITY, FOOT NEC, CONGENITAL 08/22/2007  . ALLERGIC RHINITIS, SEASONAL 05/03/2007  . Hyperlipidemia 12/21/2006  . Tobacco abuse 12/21/2006  . COPD with chronic bronchitis ([place]) 12/21/2006   PCP:  [name], DO Pharmacy:   [id number] [address], [place] [address] Phone: [phone] Fax: [phone]     Social Determinants of Health (SDOH) Interventions    Readmission Risk Interventions Readmission Risk Prevention Plan 12/23/2019  Transportation Screening Complete  PCP or Specialist Appt within 5-7 Days Complete  Home Care Screening Complete  Medication Review (RN CM) Complete  Some recent data might be hidden

## 2020-09-10 ENCOUNTER — Telehealth: Payer: Self-pay | Admitting: *Deleted

## 2020-09-10 DIAGNOSIS — J9601 Acute respiratory failure with hypoxia: Secondary | ICD-10-CM | POA: Diagnosis not present

## 2020-09-10 DIAGNOSIS — J441 Chronic obstructive pulmonary disease with (acute) exacerbation: Secondary | ICD-10-CM | POA: Diagnosis not present

## 2020-09-10 DIAGNOSIS — E43 Unspecified severe protein-calorie malnutrition: Secondary | ICD-10-CM | POA: Diagnosis not present

## 2020-09-10 DIAGNOSIS — C3411 Malignant neoplasm of upper lobe, right bronchus or lung: Secondary | ICD-10-CM | POA: Diagnosis not present

## 2020-09-10 LAB — GLUCOSE, CAPILLARY
Glucose-Capillary: 94 mg/dL (ref 70–99)
Glucose-Capillary: 113 mg/dL — ABNORMAL HIGH (ref 70–99)

## 2020-09-10 MED ORDER — PREDNISONE 10 MG PO TABS: ORAL_TABLET | ORAL | 0 refills | Status: DC

## 2020-09-10 MED ORDER — IPRATROPIUM-ALBUTEROL 0.5-2.5 (3) MG/3ML IN SOLN: 3.0000 mL | Freq: Four times a day (QID) | RESPIRATORY_TRACT | 0 refills | Status: AC | PRN

## 2020-09-10 MED ORDER — FOLIC ACID 1 MG PO TABS
1.0000 mg | ORAL_TABLET | Freq: Every day | ORAL | 0 refills | Status: DC
Start: 2020-09-10 — End: 2021-11-26

## 2020-09-10 MED ORDER — METOPROLOL TARTRATE 25 MG PO TABS
25.0000 mg | ORAL_TABLET | Freq: Two times a day (BID) | ORAL | 0 refills | Status: DC
Start: 2020-09-10 — End: 2020-12-17

## 2020-09-10 MED ORDER — ENSURE ENLIVE PO LIQD: 237.0000 mL | Freq: Three times a day (TID) | ORAL | 0 refills | Status: AC

## 2020-09-10 MED ORDER — FAMOTIDINE 20 MG PO TABS
20.0000 mg | ORAL_TABLET | Freq: Two times a day (BID) | ORAL | 0 refills | Status: DC
Start: 2020-09-10 — End: 2021-11-26

## 2020-09-10 MED ORDER — THIAMINE HCL 100 MG PO TABS
100.0000 mg | ORAL_TABLET | Freq: Every day | ORAL | 0 refills | Status: DC
Start: 2020-09-10 — End: 2021-11-26

## 2020-09-10 NOTE — Consult Note (Addendum)
   [facility] Inpatient Consult   09/10/2020  Lucas [name] Jul 08, 1951 [id number]   Patient screened for [place] Management services due to high unplanned readmission risk score and 2 hospitalizations within past 6 months.  Spoke with Lucas [name] at bedside (HIPAA verified) and explained [place] Management program. Patient verifies primary provider is at [facility].   Patient denies needs for medications, meals, or community RN care management follow up. States that he may require transportation assistance at times for medical appointments. Lucas [name] states that he does have transportation home from the hospital. Will refer patient to embedded [facility] CM care coordinator at [facility] for post hospital transition of care needs.   Of note, [facility] Care Management services does not replace or interfere with any services that are arranged by inpatient case management or social work.  Netta Cedars, MSN, [place] [facility] Liaison Nurse Mobile Phone [phone]  Toll free office [phone]

## 2020-09-10 NOTE — Care Management Important Message (Signed)
Important Message  Patient Details  Name: Lucas [name] MRN: [id number] Date of Birth: April 03, 1951   Medicare Important Message Given:     Patient left Prior to IM delivery .  Mailed to the patient home address.    Luv Mish 09/10/2020, 3:27 PM

## 2020-09-10 NOTE — Chronic Care Management (AMB) (Signed)
Chronic Care Management  ° °Note ° °09/10/2020 °Name: Lucas [name] MRN: [id number] DOB: 01/24/1951 ° °Lucas [name] is a 69 y.o. year old male who is a primary care patient of Brimage, Vondra, DO. I reached out to Maher E Hunn by phone today in response to a referral sent by Lucas [name]'s health plan.    ° °Mr. Koehl was given information about Chronic Care Management services today including:  °1. CCM service includes personalized support from designated clinical staff supervised by his physician, including individualized plan of care and coordination with other care providers °2. 24/7 contact phone numbers for assistance for urgent and routine care needs. °3. Service will only be billed when office clinical staff spend 20 minutes or more in a month to coordinate care. °4. Only one practitioner may furnish and bill the service in a calendar month. °5. The patient may stop CCM services at any time (effective at the end of the month) by phone call to the office staff. °6. The patient will be responsible for cost sharing (co-pay) of up to 20% of the service fee (after annual deductible is met). ° °Patient agreed to services and verbal consent obtained.  ° °Follow up plan: °Telephone appointment with care management team member scheduled for: 09/14/2020 ° °Stacey Snead  °Care Guide, Embedded Care Coordination °[place]   Care Management  °Direct Dial: [phone] ° °

## 2020-09-10 NOTE — Discharge Summary (Signed)
PATIENT DETAILS Name: Lucas [name] Age: 69 y.o. Sex: male Date of Birth: Aug 09, 1951 MRN: [id number]. Admitting Physician: Bennie Pierini, MD [id number], Ronnette Juniper, DO  Admit Date: 09/06/2020 Discharge date: 09/10/2020  Recommendations for Outpatient Follow-up:  1. Follow up with PCP in 1-2 weeks 2. Please obtain CMP/CBC in one week   Admitted From:  Home  Disposition: Home with home health services (but will likely refuse)   Home Health: Yes  Equipment/Devices: Resume oxygen 3L as previous  Discharge Condition: Stable  CODE STATUS: FULL CODE  Diet recommendation:  Diet Order            Diet - low sodium heart healthy           Diet regular Room service appropriate? Yes; Fluid consistency: Thin  Diet effective now                  Brief Narrative: Patient is a 69 y.o. 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  Initially managed in the ICU-extubated-and transferred to the [facility] hospital[facility] service.  See below for further details.  Significant events: 11/14>> presented to the ED with severe shortness of breath-extremities-intubated-admitted by PCCM. 11/15>> extubated 11/16>> transfer to [facility].  Significant studies: 11/14>> chest x-ray: Right upper lobe interstitial coarsening and nodularity  Antimicrobial therapy: Rocephin: 11/14>>11/18 Zithromax: 11/14>>11/16  Microbiology data: 11/14>> blood culture: No growth 11/15>> tracheal aspirate: Normal respiratory flora.  Procedures : None  Consults: [facility] course by problem list Acute on chronic hypoxic and hypercarbic respiratory failure due to COPD exacerbation: Significantly improved-moving air well-no rhonchi-has finished a course of empiric Rocephin/Zithromax-does not need any further antibiotic  therapy-continue tapering steroids-transition to usual inhaler/bronchodilator regimen on discharge.  Follow with primary care practitioner for further optimization.  Encouraged tobacco cessation-but do not think patient has any intention of quitting.  Transaminitis: Downtrending-could have had mild alcoholic hepatitis.  History of alcohol abuse: No alcohol withdrawal symptoms-managed with Ativan per CIWA protocol. Per spouse-drinks daily-mostly beer-anywhere from 4-6 cans a day.  Counseled-not sure if patient has any intention of quitting.  CAD: Stable-continue aspirin/statin/metoprolol  Tobacco Abuse: Counseled-do not think patient has any intention of quitting.  History of RUL SCC-s/p SBRT: Continue outpatient follow-up with radiation oncology and medical oncology.  Debility/deconditioning: Appreciate rehab services eval-plans are for home health on discharge.  Severe Malnutrition   Discharge Diagnoses:  Active Problems:   COPD with acute exacerbation (HCC)   Protein-calorie malnutrition, severe   Discharge Instructions:  Activity:  As tolerated    Discharge Instructions    Call MD for:  difficulty breathing, headache or visual disturbances   Complete by: As directed    Diet - low sodium heart healthy   Complete by: As directed    Discharge instructions   Complete by: As directed    Follow with Primary MD  [name], DO in 1-2 weeks  Follow with the radiation oncologist and medical oncologist in the next 1-2 weeks.  It is very important that you stop any further alcohol and tobacco use.  Please get a complete blood count and chemistry panel checked by your Primary MD at your next visit, and again as instructed by your Primary MD.  Get Medicines reviewed and adjusted: Please take all your medications with you for your next visit with your Primary MD  Laboratory/radiological  data: Please request your Primary MD to go over all hospital tests and  procedure/radiological results at the follow up, please ask your Primary MD to get all Hospital records sent to his/her office.  In some cases, they will be blood work, cultures and biopsy results pending at the time of your discharge. Please request that your primary care M.D. follows up on these results.  Also Note the following: If you experience worsening of your admission symptoms, develop shortness of breath, life threatening emergency, suicidal or homicidal thoughts you must seek medical attention immediately by calling 911 or calling your MD immediately  if symptoms less severe.  You must read complete instructions/literature along with all the possible adverse reactions/side effects for all the Medicines you take and that have been prescribed to you. Take any new Medicines after you have completely understood and accpet all the possible adverse reactions/side effects.   Do not drive when taking Pain medications or sleeping medications (Benzodaizepines)  Do not take more than prescribed Pain, Sleep and Anxiety Medications. It is not advisable to combine anxiety,sleep and pain medications without talking with your primary care practitioner  Special Instructions: If you have smoked or chewed Tobacco  in the last 2 yrs please stop smoking, stop any regular Alcohol  and or any Recreational drug use.  Wear Seat belts while driving.  Please note: You were cared for by a hospitalist during your hospital stay. Once you are discharged, your primary care physician will handle any further medical issues. Please note that NO REFILLS for any discharge medications will be authorized once you are discharged, as it is imperative that you return to your primary care physician (or establish a relationship with a primary care physician if you do not have one) for your post hospital discharge needs so that they can reassess your need for medications and monitor your lab values.   For home use only DME  Nebulizer machine   Complete by: As directed    Patient needs a nebulizer to treat with the following condition: COPD (chronic obstructive pulmonary disease) ([place])   Length of Need: Lifetime   Increase activity slowly   Complete by: As directed      Allergies as of 09/10/2020      Reactions   Ace Inhibitors Swelling   Ampicillin Nausea And Vomiting   Penicillins Nausea And Vomiting   Did it involve swelling of the face/tongue/throat, SOB, or low BP? N Did it involve sudden or severe rash/hives, skin peeling, or any reaction on the inside of your mouth or nose? N Did you need to seek medical attention at a hospital or doctor's office? N When did it last happen?Teenager If all above answers are "NO", may proceed with cephalosporin use.   Codeine Nausea And Vomiting   Lisinopril Swelling   REACTION: angioedema      Medication List    STOP taking these medications   losartan 25 MG tablet Commonly known as: COZAAR     TAKE these medications   albuterol 108 (90 Base) MCG/ACT inhaler Commonly known as: ProAir HFA INHALE 2 PUFFS BY MOUTH EVERY 4 HOURS AS NEEDED FOR WHEEZE OR FOR SHORTNESS OF BREATH What changed:   how much to take  how to take this  when to take this  reasons to take this  additional instructions   Anoro Ellipta 62.5-25 MCG/INH Aepb Generic drug: umeclidinium-vilanterol TAKE 1 PUFF BY MOUTH EVERY DAY What changed: See the new instructions.   aspirin EC 81  MG tablet Take 81 mg by mouth daily.   bisacodyl 5 MG EC tablet Generic drug: bisacodyl Take 15 mg by mouth daily as needed for moderate constipation.   famotidine 20 MG tablet Commonly known as: PEPCID Take 1 tablet (20 mg total) by mouth 2 (two) times daily.   feeding supplement Liqd Take 237 mLs by mouth 3 (three) times daily between meals.   fluticasone 50 MCG/ACT nasal spray Commonly known as: FLONASE SPRAY 2 SPRAYS INTO EACH NOSTRIL EVERY DAY What changed: See the new  instructions.   folic acid 1 MG tablet Commonly known as: FOLVITE Take 1 tablet (1 mg total) by mouth daily.   ipratropium-albuterol 0.5-2.5 (3) MG/3ML Soln Commonly known as: DUONEB Take 3 mLs by nebulization every 6 (six) hours as needed.   metoprolol tartrate 25 MG tablet Commonly known as: LOPRESSOR Take 1 tablet (25 mg total) by mouth 2 (two) times daily. What changed: how much to take   nitroGLYCERIN 0.4 MG SL tablet Commonly known as: NITROSTAT PLACE 1 TABLET (0.4 MG TOTAL) UNDER THE TONGUE EVERY 5 (FIVE) MINUTES AS NEEDED FOR CHEST PAIN.   OXYGEN Inhale 3-4 L into the lungs See admin instructions. Continuous at night, as needed during the day   oxymetazoline 0.05 % nasal spray Commonly known as: AFRIN Place 1 spray into both nostrils 2 (two) times daily as needed for congestion (nose bleeds).   predniSONE 10 MG tablet Commonly known as: DELTASONE Take 40 mg daily for 1 day, 30 mg daily for 1 day, 20 mg daily for 1 days,10 mg daily for 1 day, then stop   rosuvastatin 40 MG tablet Commonly known as: Crestor Take 1 tablet (40 mg total) by mouth daily.   thiamine 100 MG tablet Take 1 tablet (100 mg total) by mouth daily.            Durable Medical Equipment  (From admission, onward)         Start     Ordered   09/10/20 0000  For home use only DME Nebulizer machine       Question Answer Comment  Patient needs a nebulizer to treat with the following condition COPD (chronic obstructive pulmonary disease) ([place])   Length of Need Lifetime      09/10/20 0917   09/09/20 1314  For home use only DME Other see comment  Once       Comments: (oxygen conserving device) OCD eval  Question:  Length of Need  Answer:  Lifetime   09/09/20 1313          Allergies  Allergen Reactions  . Ace Inhibitors Swelling  . Ampicillin Nausea And Vomiting  . Penicillins Nausea And Vomiting    Did it involve swelling of the face/tongue/throat, SOB, or low BP? N Did it involve  sudden or severe rash/hives, skin peeling, or any reaction on the inside of your mouth or nose? N Did you need to seek medical attention at a hospital or doctor's office? N When did it last happen?Teenager If all above answers are "NO", may proceed with cephalosporin use.  . Codeine Nausea And Vomiting  . Lisinopril Swelling    REACTION: angioedema      Other Procedures/Studies: DG Chest Port 1 View  Result Date: 09/06/2020 CLINICAL DATA:  69 year old male with respiratory distress. EXAM: PORTABLE CHEST 1 VIEW COMPARISON:  Chest radiograph dated 05/04/2020 and CT dated 07/24/2020. FINDINGS: Endotracheal tube with suboptimally visualized tip due to overlying sternal wires but appears above  the carina. Enteric tube extends below the diaphragm with side-port just distal to the GE junction and tip beyond the inferior margin of the image. There is background of emphysema. There is flattening of the diaphragms with blunting of the costophrenic angles which may be related to hyperexpansion or scarring. Small pleural effusions are not excluded clinical correlation is recommended. Right upper lobe interstitial coarsening and nodularity may be chronic although developing infiltrate is not excluded. No pneumothorax identified. The cardiac silhouette is within limits. Median sternotomy wires and CABG vascular clips. Old healed right rib fracture deformities. No acute osseous pathology. IMPRESSION: 1. Endotracheal tube appears above the carina. 2. Emphysema. 3. Right upper lobe interstitial coarsening and nodularity may be chronic although developing infiltrate is not excluded. Electronically Signed   By: Anner Crete M.D.   On: 09/06/2020 22:52     TODAY-DAY OF DISCHARGE:  Subjective:   Lucas [name] today has no headache,no chest abdominal pain,no new weakness tingling or numbness, feels much better wants to go home today.   Objective:   Blood pressure (!) 134/91, pulse 85, temperature 97.9 F  (36.6 C), temperature source Oral, resp. rate 17, height 5\' 11"  (1.803 m), weight 60.1 kg, SpO2 100 %.  Intake/Output Summary (Last 24 hours) at 09/10/2020 0917 Last data filed at 09/10/2020 0534 Gross per 24 hour  Intake 600 ml  Output 1450 ml  Net -850 ml   Filed Weights   09/08/20 0311 09/09/20 0307 09/10/20 0620  Weight: 58.1 kg 58.1 kg 60.1 kg    Exam: Awake Alert, Oriented *3, No new F.N deficits, Normal affect [place].AT,PERRAL Supple Neck,No JVD, No cervical lymphadenopathy appriciated.  Symmetrical Chest wall movement, Good air movement bilaterally, CTAB RRR,No Gallops,Rubs or new Murmurs, No Parasternal Heave +ve B.Sounds, Abd Soft, Non tender, No organomegaly appriciated, No rebound -guarding or rigidity. No Cyanosis, Clubbing or edema, No new Rash or bruise   PERTINENT RADIOLOGIC STUDIES: No results found.   PERTINENT LAB RESULTS: CBC: Recent Labs    09/08/20 1205 09/09/20 0422  WBC 7.9 6.8  HGB 12.1* 12.2*  HCT 36.9* 37.6*  PLT 125* 113*   CMET CMP     Component Value Date/Time   NA 136 09/09/2020 0422   NA 138 07/03/2018 1045   K 4.3 09/09/2020 0422   CL 99 09/09/2020 0422   CO2 30 09/09/2020 0422   GLUCOSE 99 09/09/2020 0422   BUN 11 09/09/2020 0422   BUN 6 (L) 07/03/2018 1045   CREATININE 0.81 09/09/2020 0422   CREATININE 0.76 07/24/2020 1330   CREATININE 0.90 01/28/2014 1438   CALCIUM 9.7 09/09/2020 0422   PROT 6.4 (L) 09/09/2020 0422   PROT 7.2 07/03/2018 1045   ALBUMIN 3.3 (L) 09/09/2020 0422   ALBUMIN 4.3 07/03/2018 1045   AST 44 (H) 09/09/2020 0422   ALT 24 09/09/2020 0422   ALKPHOS 70 09/09/2020 0422   BILITOT 0.6 09/09/2020 0422   BILITOT 0.3 07/03/2018 1045   GFRNONAA >60 09/09/2020 0422   GFRNONAA >60 07/24/2020 1330   GFRAA >60 07/24/2020 1330    GFR Estimated Creatinine Clearance: 72.1 mL/min (by C-G formula based on SCr of 0.81 mg/dL). No results for input(s): LIPASE, AMYLASE in the last 72 hours. No results for input(s):  CKTOTAL, CKMB, CKMBINDEX, TROPONINI in the last 72 hours. Invalid input(s): POCBNP No results for input(s): DDIMER in the last 72 hours. No results for input(s): HGBA1C in the last 72 hours. No results for input(s): CHOL, HDL, LDLCALC, TRIG, CHOLHDL, LDLDIRECT in  the last 72 hours. No results for input(s): TSH, T4TOTAL, T3FREE, THYROIDAB in the last 72 hours.  Invalid input(s): FREET3 No results for input(s): VITAMINB12, FOLATE, FERRITIN, TIBC, IRON, RETICCTPCT in the last 72 hours. Coags: No results for input(s): INR in the last 72 hours.  Invalid input(s): PT Microbiology: Recent Results (from the past 240 hour(s))  Blood culture (routine x 2)     Status: None (Preliminary result)   Collection Time: 09/06/20 12:32 AM   Specimen: BLOOD  Result Value Ref Range Status   Specimen Description BLOOD RIGHT ANTECUBITAL  Final   Special Requests   Final    BOTTLES DRAWN AEROBIC AND ANAEROBIC Blood Culture adequate volume   Culture   Final    NO GROWTH 2 DAYS Performed at [facility], [address], [place], [place] [address]    Report Status PENDING  Incomplete  Respiratory Panel by RT PCR (Flu A&B, Covid) - Nasopharyngeal Swab     Status: None   Collection Time: 09/06/20 10:07 PM   Specimen: Nasopharyngeal Swab  Result Value Ref Range Status   SARS Coronavirus 2 by RT PCR NEGATIVE NEGATIVE Final    Comment: (NOTE) SARS-CoV-2 target nucleic acids are NOT DETECTED.  The SARS-CoV-2 RNA is generally detectable in upper respiratoy specimens during the acute phase of infection. The lowest concentration of SARS-CoV-2 viral copies this assay can detect is 131 copies/mL. A negative result does not preclude SARS-Cov-2 infection and should not be used as the sole basis for treatment or other patient management decisions. A negative result may occur with  improper specimen collection/handling, submission of specimen other than nasopharyngeal swab, presence of viral mutation(s) within  the areas targeted by this assay, and inadequate number of viral copies (<131 copies/mL). A negative result must be combined with clinical observations, patient history, and epidemiological information. The expected result is Negative.  Fact Sheet for Patients:  PinkCheek.be  Fact Sheet for Healthcare Providers:  GravelBags.it  This test is no t yet approved or cleared by the [place] FDA and  has been authorized for detection and/or diagnosis of SARS-CoV-2 by FDA under an Emergency Use Authorization (EUA). This EUA will remain  in effect (meaning this test can be used) for the duration of the COVID-19 declaration under Section 564(b)(1) of the Act, 21 U.S.C. section 360bbb-3(b)(1), unless the authorization is terminated or revoked sooner.     Influenza A by PCR NEGATIVE NEGATIVE Final   Influenza B by PCR NEGATIVE NEGATIVE Final    Comment: (NOTE) The Xpert Xpress SARS-CoV-2/FLU/RSV assay is intended as an aid in  the diagnosis of influenza from Nasopharyngeal swab specimens and  should not be used as a sole basis for treatment. Nasal washings and  aspirates are unacceptable for Xpert Xpress SARS-CoV-2/FLU/RSV  testing.  Fact Sheet for Patients: PinkCheek.be  Fact Sheet for Healthcare Providers: GravelBags.it  This test is not yet approved or cleared by the [place] FDA and  has been authorized for detection and/or diagnosis of SARS-CoV-2 by  FDA under an Emergency Use Authorization (EUA). This EUA will remain  in effect (meaning this test can be used) for the duration of the  Covid-19 declaration under Section 564(b)(1) of the Act, 21  U.S.C. section 360bbb-3(b)(1), unless the authorization is  terminated or revoked. Performed at [facility], [place] [address], [place], [place] [address]   Blood culture (routine x 2)     Status: None  (Preliminary result)   Collection Time: 09/06/20  11:53 PM   Specimen: BLOOD RIGHT HAND  Result Value Ref Range Status   Specimen Description BLOOD RIGHT HAND  Final   Special Requests   Final    BOTTLES DRAWN AEROBIC AND ANAEROBIC Blood Culture results may not be optimal due to an inadequate volume of blood received in culture bottles   Culture   Final    NO GROWTH 2 DAYS Performed at [facility], [place] [address] [place], [place] [address]    Report Status PENDING  Incomplete  MRSA PCR Screening     Status: None   Collection Time: 09/07/20  1:30 AM   Specimen: Nasopharyngeal  Result Value Ref Range Status   MRSA by PCR NEGATIVE NEGATIVE Final    Comment:        The GeneXpert MRSA Assay (FDA approved for NASAL specimens only), is one component of a comprehensive MRSA colonization surveillance program. It is not intended to diagnose MRSA infection nor to guide or monitor treatment for MRSA infections. Performed at [facility], [place] [address], [place], [place] [address]   Culture, respiratory (non-expectorated)     Status: None   Collection Time: 09/07/20  2:33 AM   Specimen: Tracheal Aspirate; Respiratory  Result Value Ref Range Status   Specimen Description TRACHEAL ASPIRATE  Final   Special Requests NONE  Final   Gram Stain   Final    FEW WBC PRESENT, PREDOMINANTLY PMN MODERATE GRAM POSITIVE COCCI IN PAIRS IN CHAINS    Culture   Final    ABUNDANT Normal respiratory flora-no Staph aureus or Pseudomonas seen Performed at [facility], [address], [place], [place] [address]    Report Status 09/09/2020 FINAL  Final    FURTHER DISCHARGE INSTRUCTIONS:  Get Medicines reviewed and adjusted: Please take all your medications with you for your next visit with your Primary MD  Laboratory/radiological data: Please request your Primary MD to go over all hospital tests and procedure/radiological results at the follow up, please ask your Primary MD to get  all Hospital records sent to his/her office.  In some cases, they will be blood work, cultures and biopsy results pending at the time of your discharge. Please request that your primary care M.D. goes through all the records of your hospital data and follows up on these results.  Also Note the following: If you experience worsening of your admission symptoms, develop shortness of breath, life threatening emergency, suicidal or homicidal thoughts you must seek medical attention immediately by calling 911 or calling your MD immediately  if symptoms less severe.  You must read complete instructions/literature along with all the possible adverse reactions/side effects for all the Medicines you take and that have been prescribed to you. Take any new Medicines after you have completely understood and accpet all the possible adverse reactions/side effects.   Do not drive when taking Pain medications or sleeping medications (Benzodaizepines)  Do not take more than prescribed Pain, Sleep and Anxiety Medications. It is not advisable to combine anxiety,sleep and pain medications without talking with your primary care practitioner  Special Instructions: If you have smoked or chewed Tobacco  in the last 2 yrs please stop smoking, stop any regular Alcohol  and or any Recreational drug use.  Wear Seat belts while driving.  Please note: You were cared for by a hospitalist during your hospital stay. Once you are discharged, your primary care physician will handle any further medical issues. Please note that NO REFILLS  for any discharge medications will be authorized once you are discharged, as it is imperative that you return to your primary care physician (or establish a relationship with a primary care physician if you do not have one) for your post hospital discharge needs so that they can reassess your need for medications and monitor your lab values.  Total Time spent coordinating discharge including  counseling, education and face to face time equals 35 minutes.  SignedOren Binet 09/10/2020 9:17 AM

## 2020-09-10 NOTE — TOC Transition Note (Signed)
Transition of Care [facility]) - CM/SW Discharge Note   Patient Details  Name: Lucas [name] MRN: [id number] Date of Birth: [birth year]  [facility] CM/SW Contact:  Joanne Chars, LCSW Phone Number: 09/10/2020, 9:43 AM   Clinical Narrative:   Pt discharging home today.  CSW again spoke with pt about recommendation for [facility], [facility] or outpt PT services and pt declines them both.  Pt does ask about getting a transfer bench and CSW spoke with Freda Munro from [employer]: transfer bench only covered by Medicaid, out of pocket cost is $60.  CSW spoke with pt who will buy transfer bench from another source.  Pt is on home O2, could not remember the DME company, permission given to speak with wife.  CSW spoke with Annamary Carolin, [employer] is [employer] and she does have O2 for transportation home.  No other needs identified.     Final next level of care: Home/Self Care Barriers to Discharge: Barriers Resolved   Patient Goals and CMS Choice Patient states their goals for this hospitalization and ongoing recovery are:: home with wife CMS Medicare.gov Compare Post Acute Care list provided to:: Patient Choice offered to / list presented to : Patient  Discharge Placement                       Discharge Plan and Services   Discharge Planning Services: CM Consult Post Acute Care Choice: Home Health          DME Arranged: N/A DME Agency: NA       HH Arranged: Refused [place] Agency: NA        Social Determinants of Health ([place]) Interventions     Readmission Risk Interventions Readmission Risk Prevention Plan 12/23/2019  Transportation Screening Complete  PCP or Specialist Appt within 5-7 Days Complete  Home Care Screening Complete  Medication Review (RN CM) Complete  Some recent data might be hidden
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
## 2020-09-10 NOTE — TOC Progression Note (Signed)
Transition of Care [facility]) - Progression Note    Patient Details  Name: Lucas [name] MRN: [id number] Date of Birth: 1951/10/21  Transition of Care [facility]) CM/SW Contact  Bartholomew Crews, RN Phone Number: [phone] 09/10/2020, 8:20 AM  Clinical Narrative:     Notified by [employer] liaison yesterday that patient declined to accept etank needed for transportation home when discharged. Patient had stated during conversation yesterday that he didn't need oxygen to get home that he would just roll down the window and let the air blow in his face. NCM noted that sats dropped briefly to 70s on oxygen during conversation. Encouraged patient that oxygen was needed for transport home.   Expected Discharge Plan: Home/Self Care    Expected Discharge Plan and Services Expected Discharge Plan: Home/Self Care   Discharge Planning Services: CM Consult Post Acute Care Choice: Home Health Living arrangements for the past 2 months: Apartment                 DME Arranged: N/A DME Agency: NA       HH Arranged: Refused [place] Agency: NA         Social Determinants of Health (SDOH) Interventions    Readmission Risk Interventions Readmission Risk Prevention Plan 12/23/2019  Transportation Screening Complete  PCP or Specialist Appt within 5-7 Days Complete  Home Care Screening Complete  Medication Review (RN CM) Complete  Some recent data might be hidden

## 2020-09-10 NOTE — Consult Note (Signed)
   [facility] Inpatient Consult   09/10/2020  Lucas [name] 1950/11/11 [id number]   [facility] [ACO] Patient:   Patient screened for high risk score for unplanned readmission score and for hospitalizations to check if potential [place] Management service needs.  Review of patient's medical record reveals patient is for home but did not want home health.  Spoke with patient via hospital phone.  Patient states his biggest issue is transportation and someone had just talked to him about it. Patient will have the transition of care call conducted by the [place] primary care provider. This patient is also in an [facility] which has a chronic disease management Embedded Care Management team.  Plan: Notification sent to the [place] Management and made aware of above needs for transition of care.   Natividad Brood, RN BSN [facility] Liaison  [phone] business mobile phone Toll free office [phone]  Fax number: [phone] [email or web address]

## 2020-09-11 ENCOUNTER — Ambulatory Visit: Payer: Medicare Other | Admitting: Family Medicine

## 2020-09-12 LAB — CULTURE, BLOOD (ROUTINE X 2)
Culture: NO GROWTH
Culture: NO GROWTH
Special Requests: ADEQUATE

## 2020-09-14 ENCOUNTER — Ambulatory Visit: Payer: Medicare Other

## 2020-09-14 DIAGNOSIS — J441 Chronic obstructive pulmonary disease with (acute) exacerbation: Secondary | ICD-10-CM

## 2020-09-14 DIAGNOSIS — J449 Chronic obstructive pulmonary disease, unspecified: Secondary | ICD-10-CM

## 2020-09-14 NOTE — Chronic Care Management (AMB) (Signed)
Chronic Care Management   Initial Visit Note  09/14/2020 Name: Lucas [name] MRN: [id number] DOB: 1951/09/09    Reason for referral : Care Coordination (Transportation)   Lucas [name] is a 69 y.o. year old male who is a primary care patient of [name], DO. The CCM team was consulted for assistance with chronic disease management and care coordination needs related to COPD exacerbation  RNCM reached out to Lucas [name] by to introduce myself, and do an assessment of chronic conditions.   Assessment: Called the patient to discuss what problems the patient was having.  The only issue the patient stated that he had was transportation to his appointment.   The CCM team visited with Lucas [name] after PCP f/u appointment with [name], DO and addressed the issues as outlined below:   Patient Care Plan: RN Case Manager  Problem Identified: Transportation   Goal: Transportation   Start Date: 09/14/2020  Expected End Date: 09/25/2020  Priority: Medium  Note:    . Care Coordination needs related to needed Transportation in a patient with COPD exacerbation . Chronic Disease Management support and education needs related to IP event 09/07/20-11/18/221  Nurse Case Manager Clinical Goal(s):  Lucas [name] Kitchen Over the next 14 days, patient will verbalize understanding of plan to obtain needed transportation needs.  . Over the next  14 days, patient will work with care guide to address needs related to transportation needs.  Interventions:  . Inter-disciplinary care team collaboration (see longitudinal plan of care) . Evaluation of current treatment plan related to discharge summary and patient's adherence to plan as established by provider. . Reviewed medications with patient and discussed importance of medications. - patient stated that he did not have some of his medications because he did not have refills on the meds.  RNCM called the pharmacy and spoke with [facility] and all of his medications  were waiting for him to pick up.  There was a miscommunication.  This was explained to the patient and he could come and pick up his meds. The patient states that he needs to discuss his medications with his PCP. Lucas [name] Kitchen Discussed plans with patient for ongoing care management follow up and provided patient with direct contact information for care management team . Reviewed scheduled/upcoming provider appointments including: Dr Susa Simmonds 09/16/20 220 pm . Care Guide referral for was sent and confirmation was received . Patient states that he is breathing fine.  He is using 3 liter of oxygen.  He denies any cough or congestion.  Patient Goals/Self Care Activities:  . Self administers medications as prescribed . Attend all scheduled provider appointments . Call pharmacy for medication refills . Call provider office for new concerns or questions  Take medications as prescribed including inhalers  Practice and use pursed lip breathing for shortness of breath recovery and prevention  Self assess COPD action plan zone and make appointment with provider if you have been in the yellow zone for 48 hours without improvement.  Utilize infection prevention strategies to reduce risk of respiratory infection   Be ready for appointment and know transportation number   Follow up Plan: The care management team will reach out to the patient again over the next 10 days.        Review of patient status, including review of consultants reports, relevant laboratory and other test results, and collaboration with appropriate care team members and the patient's provider was performed as part of comprehensive patient evaluation and provision of chronic  care management services.    SDOH (Social Determinants of Health) assessments performed: No See Care Plan activities for detailed interventions related to SDOH     Medications: Outpatient Encounter Medications as of 09/14/2020  Medication Sig Note  . albuterol  (PROAIR HFA) 108 (90 Base) MCG/ACT inhaler INHALE 2 PUFFS BY MOUTH EVERY 4 HOURS AS NEEDED FOR WHEEZE OR FOR SHORTNESS OF BREATH (Patient taking differently: Inhale 2 puffs into the lungs every 4 (four) hours as needed for wheezing or shortness of breath. )   . ANORO ELLIPTA 62.5-25 MCG/INH AEPB TAKE 1 PUFF BY MOUTH EVERY DAY (Patient taking differently: Inhale 1 puff into the lungs daily. )   . bisacodyl (BISACODYL) 5 MG EC tablet Take 15 mg by mouth daily as needed for moderate constipation.    . feeding supplement (ENSURE ENLIVE / ENSURE PLUS) LIQD Take 237 mLs by mouth 3 (three) times daily between meals.   . fluticasone (FLONASE) 50 MCG/ACT nasal spray SPRAY 2 SPRAYS INTO EACH NOSTRIL EVERY DAY (Patient taking differently: Place 1 spray into both nostrils daily as needed for allergies. )   . nitroGLYCERIN (NITROSTAT) 0.4 MG SL tablet PLACE 1 TABLET (0.4 MG TOTAL) UNDER THE TONGUE EVERY 5 (FIVE) MINUTES AS NEEDED FOR CHEST PAIN. 05/02/2020: Pt took 2 doses this morning due to chest tightness  . OXYGEN Inhale 3-4 L into the lungs See admin instructions. Continuous at night, as needed during the day   . oxymetazoline (AFRIN) 0.05 % nasal spray Place 1 spray into both nostrils 2 (two) times daily as needed for congestion (nose bleeds).    Lucas [name] Kitchen aspirin EC 81 MG tablet Take 81 mg by mouth daily.  (Patient not taking: Reported on 09/14/2020)   . famotidine (PEPCID) 20 MG tablet Take 1 tablet (20 mg total) by mouth 2 (two) times daily. (Patient not taking: Reported on 09/14/2020)   . folic acid (FOLVITE) 1 MG tablet Take 1 tablet (1 mg total) by mouth daily. (Patient not taking: Reported on 09/14/2020)   . ipratropium-albuterol (DUONEB) 0.5-2.5 (3) MG/3ML SOLN Take 3 mLs by nebulization every 6 (six) hours as needed. (Patient not taking: Reported on 09/14/2020)   . metoprolol tartrate (LOPRESSOR) 25 MG tablet Take 1 tablet (25 mg total) by mouth 2 (two) times daily. (Patient not taking: Reported on 09/14/2020)    . predniSONE (DELTASONE) 10 MG tablet Take 40 mg daily for 1 day, 30 mg daily for 1 day, 20 mg daily for 1 days,10 mg daily for 1 day, then stop (Patient not taking: Reported on 09/14/2020)   . rosuvastatin (CRESTOR) 40 MG tablet Take 1 tablet (40 mg total) by mouth daily. (Patient not taking: Reported on 05/26/2020) 04/27/2020: Dose change reported 04/01/2020  . thiamine 100 MG tablet Take 1 tablet (100 mg total) by mouth daily. (Patient not taking: Reported on 09/14/2020)   . [DISCONTINUED] loratadine (CLARITIN) 10 MG tablet Take 1 tablet (10 mg total) by mouth daily.   . [DISCONTINUED] metoprolol tartrate (LOPRESSOR) 25 MG tablet Take 1 tablet (25 mg total) by mouth 2 (two) times daily.    No facility-administered encounter medications on file as of 09/14/2020.      Mr. Gabbard was given information about Chronic Care Management services today including:  1. CCM service includes personalized support from designated clinical staff supervised by his physician, including individualized plan of care and coordination with other care providers 2. 24/7 contact phone numbers for assistance for urgent and routine care needs. 3. Service will only  be billed when office clinical staff spend 20 minutes or more in a month to coordinate care. 4. Only one practitioner may furnish and bill the service in a calendar month. 5. The patient may stop CCM services at any time (effective at the end of the month) by phone call to the office staff. 6. The patient will be responsible for cost sharing (co-pay) of up to 20% of the service fee (after annual deductible is met).  Lazaro Arms RN, BSN, [facility] Care Management Coordinator [place] Phone: [phone] Fax: [phone]

## 2020-09-14 NOTE — Patient Instructions (Signed)
Visit Information  Goals Addressed              This Visit's Progress   .  I need transportation to my appointment (pt-stated)        Patient Goals/Self Care Activities:  . Self administers medications as prescribed . Attend all scheduled provider appointments . Call pharmacy for medication refills . Call provider office for new concerns or questions  Take medications as prescribed including inhalers  Practice and use pursed lip breathing for shortness of breath recovery and prevention  Self assess COPD action plan zone and make appointment with provider if you have been in the yellow zone for 48 hours without improvement.  Utilize infection prevention strategies to reduce risk of respiratory infection   Be ready for appointment and know transportation number         Mr. Lingelbach was given information about Care Management services today including:  1. Care Management services include personalized support from designated clinical staff supervised by his physician, including individualized plan of care and coordination with other care providers 2. 24/7 contact phone numbers for assistance for urgent and routine care needs. 3. The patient may stop CCM services at any time (effective at the end of the month) by phone call to the office staff.  Patient agreed to services and verbal consent obtained.   The patient verbalized understanding of instructions, educational materials, and care plan provided today and declined offer to receive copy of patient instructions, educational materials, and care plan.   The care management team will reach out to the patient again over the next 10 days.   Lazaro Arms RN, BSN, [facility] Care Management Coordinator [place] Phone: [phone] I Fax: [phone]

## 2020-09-15 ENCOUNTER — Telehealth: Payer: Self-pay | Admitting: Family Medicine

## 2020-09-15 NOTE — Telephone Encounter (Signed)
   [facility] 09/15/2020   Name: ATREUS HASZ   MRN: [id number]   DOB: 10-22-1951   AGE: 69 y.o.   GENDER: male   PCP Brimage, Vondra, DO.    Spoke with Mr. Paci on Monday, September 14, 2020 regarding [employer] Referral for transportation. Informed patient that Care Guide placed a referral for [place] for his appointment scheduled for Wednesday, September 16, 2020 at 2:20pm and that he will be receiving a call from their office. Patient stated understanding.   Received confirmation e-mail from [facility] that they have spoken with Mr. Minor and that his ride has been scheduled and confirmed.   No additional needs at this time.   Closing referral pending any other needs of patient.    Good evening [place],  Please be advised that transportation has been arranged for Isamar Wellbrock and is wife on 09/16/2020.  Please see below for confirmation.      Thank You,  [place]  SW-Care Management Transportation Coordinator  Transportation Services Main Phone: [phone] Website: [email or web address]    [place], Care Management Phone: [phone] Email: [email or web address]

## 2020-09-16 ENCOUNTER — Encounter: Payer: Self-pay | Admitting: Family Medicine

## 2020-09-16 ENCOUNTER — Other Ambulatory Visit: Payer: Self-pay

## 2020-09-16 ENCOUNTER — Ambulatory Visit (INDEPENDENT_AMBULATORY_CARE_PROVIDER_SITE_OTHER): Payer: Medicare Other | Admitting: Family Medicine

## 2020-09-16 VITALS — BP 102/60 | HR 86 | Ht 71.0 in | Wt 131.2 lb

## 2020-09-16 DIAGNOSIS — J449 Chronic obstructive pulmonary disease, unspecified: Secondary | ICD-10-CM | POA: Diagnosis not present

## 2020-09-16 DIAGNOSIS — Z23 Encounter for immunization: Secondary | ICD-10-CM

## 2020-09-16 NOTE — Progress Notes (Signed)
   SUBJECTIVE:   CHIEF COMPLAINT / HPI:   CC: Hospital follow-up   Lucas [name] is a 69 y.o. male here for hospital follow up.  Patient admitted (09/06/20) for acute respiratory failure secondary to COPD exacerbation.  He was hospitalized for 3 days 1 of which he was intubated.  States he uses 3 L [place] oxygen as needed.  "I feel great."  Follows with pulmonology but has to make an appointment with Dr. Valeta Harms. Declined home health services at discharge.  His wife helps him and he believes he does not need it.  He uses his inhalers (Anoro Ellipta and albuterol). Used albuterol once in the last 24 hours. Has "good days and bad days" and today is a good day.   Still smokes 1/2 ppd down from 2-3 ppd for >50 years.     PERTINENT  PMH / PSH: reviewed and updated as appropriate   OBJECTIVE:   BP 102/60   Pulse 86   Ht 5\' 11"  (1.803 m)   Wt 131 lb 3.2 oz (59.5 kg)   SpO2 97%   BMI 18.30 kg/m    GEN: pleasant well-appearing elderly male, in no acute distress  CV: regular rate and rhythm, no murmurs appreciated  RESP: no increased work of breathing, clear to ascultation bilaterally with no crackles, wheezes, or rhonchi, on room air SKIN: warm, dry, no cyanosis NEURO: alert and oriented, moves all extremities appropriately      ASSESSMENT/PLAN:   COPD with chronic bronchitis ([place]) Patient doing well since hospitalization.  Denies needing refills of her inhalers.  Was told that he needed a nebulizer but has not gotten this yet.  Nebulizer given to patient today in clinic.  He is to go to the pharmacy to pick up nebulizer vials.  Patient to follow-up with Dr. Valeta Harms.  Patient requests portable oxygen tanks that is lighter so he can continue to be active.  Reports that he has someone already working on this.  Advised him to quit smoking.  ED precautions given.  Continue to use nasal cannula as needed.    Pneumococcal vaccine and influenza today   Patient has already vaccinated.   Reports getting Covid booster via his niece.  [name], DO PGY-2, [facility] 09/16/2020

## 2020-09-16 NOTE — Patient Instructions (Addendum)
It was great seeing you today!   Continue to use your inhalers as directed.  If you have to use your albuterol inhaler more than 3 times a day for 2 or more days please call the clinic.    Use home oxygen as needed. Continue working on quitting smoking.     If you have questions or concerns please do not hesitate to call at [phone].  Dr. Rushie [facility]

## 2020-09-16 NOTE — Assessment & Plan Note (Addendum)
Patient doing well since hospitalization.  Denies needing refills of her inhalers.  Was told that he needed a nebulizer but has not gotten this yet.  Nebulizer given to patient today in clinic.  He is to go to the pharmacy to pick up nebulizer vials.  Patient to follow-up with Dr. Valeta Harms.  Patient requests portable oxygen tanks that is lighter so he can continue to be active.  Reports that he has someone already working on this.  Advised him to quit smoking.  ED precautions given.  Continue to use nasal cannula as needed.

## 2020-09-22 ENCOUNTER — Ambulatory Visit: Payer: Medicare Other

## 2020-09-22 NOTE — Patient Instructions (Signed)
Visit Information  Patient Goals/Self Care Activities:    Self administers medications as prescribed   Attend all scheduled provider appointments   Call pharmacy for medication refills    Call provider office for new concerns or questions    Take medications as prescribed including inhalers    Practice and use pursed lip breathing for shortness of breath recovery and prevention    Self assess COPD action plan zone and make appointment with provider if you have been in the yellow           zone for 48 hours without improvement.     Utilize infection prevention strategies to reduce risk of respiratory infection      Be ready for appointment and know transportation number     The patient verbalized understanding of instructions, educational materials, and care plan provided today and declined offer to receive copy of patient instructions, educational materials, and care plan.   Follow up Plan: No further follow up required    Lazaro Arms RN, BSN, [place] Phone: [phone] I Fax: [phone]

## 2020-09-22 NOTE — Chronic Care Management (AMB) (Signed)
RN  Care Management   Follow Up Note   09/22/2020 Name: Lucas [name] MRN: [id number] DOB: 19-Feb-1951  Reason for referral : Care Coordination (Transportation)   Lucas [name] is a 69 y.o. year old male who is a primary care patient of [name], DO. The care management team was consulted for assistance with care management and care coordination needs.      Assessment: Called the patient to follow up on his transportation.  Patient Care Plan: RN Case Manager  Problem Identified: Transportation   Goal: Transportation Completed 09/22/2020  Start Date: 09/14/2020  Expected End Date: 09/25/2020  Priority: Medium  Note:    . Care Coordination needs related to needed Transportation in a patient with COPD exacerbation . Chronic Disease Management support and education needs related to IP event 09/07/20-11/18/221  Nurse Case Manager Clinical Goal(s):  Marland Kitchen Over the next 14 days, patient will verbalize understanding of plan to obtain needed transportation needs.  . Over the next  14 days, patient will work with care guide to address needs related to transportation needs.  Interventions:  . Inter-disciplinary care team collaboration (see longitudinal plan of care) . Evaluation of current treatment plan related to discharge summary and patient's adherence to plan as established by provider. . Reviewed medications with patient and discussed importance of medications. - patient stated that he has his medications and was given a nebulizer at his appointment.  He has used it and feels that it has helped his breathing.  . Discussed plans with patient for ongoing care management follow up and provided patient with direct contact information for care management team . Reviewed scheduled/upcoming provider appointments including: Dr Susa Simmonds 09/16/20 220 pm . Patient states that he is breathing fine.  He appreciated the help with transportation and he knows that if he has and appointment [facility]  all he has to do is call the number he was given and he has a ride.   He states that he has 2 appointments next month and he was very appreciative for all of the help.  He stated that he had no other questions or concerns. I explained that if in the future he needed any help to feel free to give me a call.   Patient Goals/Self Care Activities:    Self administers medications as prescribed   Attend all scheduled provider appointments   Call pharmacy for medication refills    Call provider office for new concerns or questions    Take medications as prescribed including inhalers    Practice and use pursed lip breathing for shortness of breath recovery and prevention    Self assess COPD action plan zone and make appointment with provider if you have been in the yellow           zone for 48 hours without improvement.     Utilize infection prevention strategies to reduce risk of respiratory infection      Be ready for appointment and know transportation number   Follow up Plan: No further follow up required       Review of patient status, including review of consultants reports, relevant laboratory and other test results, and collaboration with appropriate care team members and the patient's provider was performed as part of comprehensive patient evaluation and provision of chronic care management services.    SDOH (Social Determinants of Health) assessments performed: No See Care Plan activities for detailed interventions related to [facility])  Lazaro Arms RN, BSN, [facility] Care Management Coordinator [place] Phone: [phone] Fax: [phone]

## 2020-09-24 DIAGNOSIS — I2510 Atherosclerotic heart disease of native coronary artery without angina pectoris: Secondary | ICD-10-CM | POA: Diagnosis not present

## 2020-09-24 DIAGNOSIS — J449 Chronic obstructive pulmonary disease, unspecified: Secondary | ICD-10-CM | POA: Diagnosis not present

## 2020-09-24 DIAGNOSIS — J90 Pleural effusion, not elsewhere classified: Secondary | ICD-10-CM | POA: Diagnosis not present

## 2020-09-28 ENCOUNTER — Other Ambulatory Visit: Payer: Self-pay

## 2020-09-28 ENCOUNTER — Inpatient Hospital Stay (HOSPITAL_COMMUNITY)
Admission: EM | Admit: 2020-09-28 | Discharge: 2020-10-01 | DRG: 190 | Disposition: A | Discharge status: Home / Self Care | Payer: Medicare Other | Attending: Family Medicine | Admitting: Family Medicine

## 2020-09-28 ENCOUNTER — Encounter (HOSPITAL_COMMUNITY): Payer: Self-pay

## 2020-09-28 ENCOUNTER — Emergency Department (HOSPITAL_COMMUNITY): Payer: Medicare Other

## 2020-09-28 DIAGNOSIS — Z720 Tobacco use: Secondary | ICD-10-CM | POA: Diagnosis present

## 2020-09-28 DIAGNOSIS — F17210 Nicotine dependence, cigarettes, uncomplicated: Secondary | ICD-10-CM | POA: Diagnosis present

## 2020-09-28 DIAGNOSIS — J9601 Acute respiratory failure with hypoxia: Secondary | ICD-10-CM | POA: Diagnosis not present

## 2020-09-28 DIAGNOSIS — Z7982 Long term (current) use of aspirin: Secondary | ICD-10-CM | POA: Diagnosis not present

## 2020-09-28 DIAGNOSIS — E43 Unspecified severe protein-calorie malnutrition: Secondary | ICD-10-CM | POA: Diagnosis present

## 2020-09-28 DIAGNOSIS — I2510 Atherosclerotic heart disease of native coronary artery without angina pectoris: Secondary | ICD-10-CM

## 2020-09-28 DIAGNOSIS — Z79899 Other long term (current) drug therapy: Secondary | ICD-10-CM

## 2020-09-28 DIAGNOSIS — Z8711 Personal history of peptic ulcer disease: Secondary | ICD-10-CM

## 2020-09-28 DIAGNOSIS — J449 Chronic obstructive pulmonary disease, unspecified: Secondary | ICD-10-CM | POA: Diagnosis not present

## 2020-09-28 DIAGNOSIS — Z951 Presence of aortocoronary bypass graft: Secondary | ICD-10-CM | POA: Diagnosis not present

## 2020-09-28 DIAGNOSIS — Z8719 Personal history of other diseases of the digestive system: Secondary | ICD-10-CM

## 2020-09-28 DIAGNOSIS — Z515 Encounter for palliative care: Secondary | ICD-10-CM | POA: Diagnosis not present

## 2020-09-28 DIAGNOSIS — F1020 Alcohol dependence, uncomplicated: Secondary | ICD-10-CM | POA: Diagnosis present

## 2020-09-28 DIAGNOSIS — R000 Tachycardia, unspecified: Secondary | ICD-10-CM | POA: Diagnosis not present

## 2020-09-28 DIAGNOSIS — J9621 Acute and chronic respiratory failure with hypoxia: Secondary | ICD-10-CM | POA: Diagnosis not present

## 2020-09-28 DIAGNOSIS — R911 Solitary pulmonary nodule: Secondary | ICD-10-CM | POA: Diagnosis not present

## 2020-09-28 DIAGNOSIS — K7460 Unspecified cirrhosis of liver: Secondary | ICD-10-CM | POA: Diagnosis not present

## 2020-09-28 DIAGNOSIS — Z9981 Dependence on supplemental oxygen: Secondary | ICD-10-CM

## 2020-09-28 DIAGNOSIS — K2990 Gastroduodenitis, unspecified, without bleeding: Secondary | ICD-10-CM | POA: Diagnosis present

## 2020-09-28 DIAGNOSIS — Z20822 Contact with and (suspected) exposure to covid-19: Secondary | ICD-10-CM | POA: Diagnosis present

## 2020-09-28 DIAGNOSIS — Z880 Allergy status to penicillin: Secondary | ICD-10-CM

## 2020-09-28 DIAGNOSIS — Z825 Family history of asthma and other chronic lower respiratory diseases: Secondary | ICD-10-CM

## 2020-09-28 DIAGNOSIS — Z888 Allergy status to other drugs, medicaments and biological substances status: Secondary | ICD-10-CM | POA: Diagnosis not present

## 2020-09-28 DIAGNOSIS — Z681 Body mass index (BMI) 19 or less, adult: Secondary | ICD-10-CM | POA: Diagnosis not present

## 2020-09-28 DIAGNOSIS — E785 Hyperlipidemia, unspecified: Secondary | ICD-10-CM | POA: Diagnosis present

## 2020-09-28 DIAGNOSIS — R64 Cachexia: Secondary | ICD-10-CM | POA: Diagnosis not present

## 2020-09-28 DIAGNOSIS — I252 Old myocardial infarction: Secondary | ICD-10-CM

## 2020-09-28 DIAGNOSIS — I214 Non-ST elevation (NSTEMI) myocardial infarction: Secondary | ICD-10-CM | POA: Diagnosis present

## 2020-09-28 DIAGNOSIS — J441 Chronic obstructive pulmonary disease with (acute) exacerbation: Secondary | ICD-10-CM | POA: Diagnosis not present

## 2020-09-28 DIAGNOSIS — Z79891 Long term (current) use of opiate analgesic: Secondary | ICD-10-CM

## 2020-09-28 DIAGNOSIS — G629 Polyneuropathy, unspecified: Secondary | ICD-10-CM | POA: Diagnosis present

## 2020-09-28 DIAGNOSIS — J80 Acute respiratory distress syndrome: Secondary | ICD-10-CM | POA: Diagnosis not present

## 2020-09-28 DIAGNOSIS — Z885 Allergy status to narcotic agent status: Secondary | ICD-10-CM | POA: Diagnosis not present

## 2020-09-28 DIAGNOSIS — Z923 Personal history of irradiation: Secondary | ICD-10-CM

## 2020-09-28 DIAGNOSIS — F419 Anxiety disorder, unspecified: Secondary | ICD-10-CM | POA: Diagnosis present

## 2020-09-28 DIAGNOSIS — J189 Pneumonia, unspecified organism: Secondary | ICD-10-CM | POA: Diagnosis present

## 2020-09-28 DIAGNOSIS — D72819 Decreased white blood cell count, unspecified: Secondary | ICD-10-CM | POA: Diagnosis not present

## 2020-09-28 DIAGNOSIS — K2970 Gastritis, unspecified, without bleeding: Secondary | ICD-10-CM | POA: Diagnosis present

## 2020-09-28 DIAGNOSIS — I10 Essential (primary) hypertension: Secondary | ICD-10-CM | POA: Diagnosis not present

## 2020-09-28 DIAGNOSIS — R0689 Other abnormalities of breathing: Secondary | ICD-10-CM | POA: Diagnosis not present

## 2020-09-28 DIAGNOSIS — C3411 Malignant neoplasm of upper lobe, right bronchus or lung: Secondary | ICD-10-CM | POA: Diagnosis not present

## 2020-09-28 DIAGNOSIS — Z7951 Long term (current) use of inhaled steroids: Secondary | ICD-10-CM

## 2020-09-28 DIAGNOSIS — R739 Hyperglycemia, unspecified: Secondary | ICD-10-CM | POA: Diagnosis not present

## 2020-09-28 DIAGNOSIS — R918 Other nonspecific abnormal finding of lung field: Secondary | ICD-10-CM | POA: Diagnosis present

## 2020-09-28 DIAGNOSIS — Z7189 Other specified counseling: Secondary | ICD-10-CM | POA: Diagnosis not present

## 2020-09-28 DIAGNOSIS — R062 Wheezing: Secondary | ICD-10-CM | POA: Diagnosis not present

## 2020-09-28 DIAGNOSIS — R0602 Shortness of breath: Secondary | ICD-10-CM | POA: Diagnosis not present

## 2020-09-28 DIAGNOSIS — J440 Chronic obstructive pulmonary disease with acute lower respiratory infection: Secondary | ICD-10-CM | POA: Diagnosis present

## 2020-09-28 LAB — I-STAT VENOUS BLOOD GAS, ED
Acid-Base Excess: 3 mmol/L — ABNORMAL HIGH (ref 0.0–2.0)
Bicarbonate: 32.2 mmol/L — ABNORMAL HIGH (ref 20.0–28.0)
Calcium, Ion: 1.17 mmol/L (ref 1.15–1.40)
HCT: 45 % (ref 39.0–52.0)
Hemoglobin: 15.3 g/dL (ref 13.0–17.0)
O2 Saturation: 99 %
Potassium: 4.3 mmol/L (ref 3.5–5.1)
Sodium: 141 mmol/L (ref 135–145)
TCO2: 34 mmol/L — ABNORMAL HIGH (ref 22–32)
pCO2, Ven: 65.8 mmHg — ABNORMAL HIGH (ref 44.0–60.0)
pH, Ven: 7.298 (ref 7.250–7.430)
pO2, Ven: 143 mmHg — ABNORMAL HIGH (ref 32.0–45.0)

## 2020-09-28 LAB — COMPREHENSIVE METABOLIC PANEL
ALT: 45 U/L — ABNORMAL HIGH (ref 0–44)
AST: 74 U/L — ABNORMAL HIGH (ref 15–41)
Albumin: 4.4 g/dL (ref 3.5–5.0)
Alkaline Phosphatase: 92 U/L (ref 38–126)
Anion gap: 10 (ref 5–15)
BUN: 9 mg/dL (ref 8–23)
CO2: 33 mmol/L — ABNORMAL HIGH (ref 22–32)
Calcium: 9.8 mg/dL (ref 8.9–10.3)
Chloride: 100 mmol/L (ref 98–111)
Creatinine, Ser: 0.89 mg/dL (ref 0.61–1.24)
GFR, Estimated: >60 mL/min (ref >60)
Glucose, Bld: 96 mg/dL (ref 70–99)
Potassium: 4.5 mmol/L (ref 3.5–5.1)
Sodium: 143 mmol/L (ref 135–145)
Total Bilirubin: 0.8 mg/dL (ref 0.3–1.2)
Total Protein: 8.3 g/dL — ABNORMAL HIGH (ref 6.5–8.1)

## 2020-09-28 LAB — RESP PANEL BY RT-PCR (FLU A&B, COVID) ARPGX2
Influenza A by PCR: NEGATIVE
Influenza B by PCR: NEGATIVE
SARS Coronavirus 2 by RT PCR: NEGATIVE

## 2020-09-28 LAB — CBC WITH DIFFERENTIAL/PLATELET
Abs Immature Granulocytes: 0.01 10*3/uL (ref 0.00–0.07)
Basophils Absolute: 0 10*3/uL (ref 0.0–0.1)
Basophils Relative: 1 %
Eosinophils Absolute: 0.1 10*3/uL (ref 0.0–0.5)
Eosinophils Relative: 2 %
HCT: 43.9 % (ref 39.0–52.0)
Hemoglobin: 14.7 g/dL (ref 13.0–17.0)
Immature Granulocytes: 0 %
Lymphocytes Relative: 36 %
Lymphs Abs: 2.2 10*3/uL (ref 0.7–4.0)
MCH: 32.6 pg (ref 26.0–34.0)
MCHC: 33.5 g/dL (ref 30.0–36.0)
MCV: 97.3 fL (ref 80.0–100.0)
Monocytes Absolute: 0.9 10*3/uL (ref 0.1–1.0)
Monocytes Relative: 15 %
Neutro Abs: 2.9 10*3/uL (ref 1.7–7.7)
Neutrophils Relative %: 46 %
Platelets: 211 10*3/uL (ref 150–400)
RBC: 4.51 MIL/uL (ref 4.22–5.81)
RDW: 13.2 % (ref 11.5–15.5)
WBC: 6.1 10*3/uL (ref 4.0–10.5)
nRBC: 0 % (ref 0.0–0.2)

## 2020-09-28 LAB — MAGNESIUM: Magnesium: 2.8 mg/dL — ABNORMAL HIGH (ref 1.7–2.4)

## 2020-09-28 LAB — BRAIN NATRIURETIC PEPTIDE: B Natriuretic Peptide: 44.3 pg/mL (ref 0.0–100.0)

## 2020-09-28 LAB — LACTIC ACID, PLASMA: Lactic Acid, Venous: 2.4 mmol/L (ref 0.5–1.9)

## 2020-09-28 LAB — TROPONIN I (HIGH SENSITIVITY): Troponin I (High Sensitivity): 18 ng/L — ABNORMAL HIGH (ref <18)

## 2020-09-28 MED ORDER — SODIUM CHLORIDE 0.9 % IV SOLN
2.0000 g | Freq: Once | INTRAVENOUS | Status: AC
  Administered 2020-09-29: 2 g via INTRAVENOUS
  Filled 2020-09-28: qty 2

## 2020-09-28 MED ORDER — ALBUTEROL (5 MG/ML) CONTINUOUS INHALATION SOLN
10.0000 mg/h | INHALATION_SOLUTION | Freq: Once | RESPIRATORY_TRACT | Status: AC
  Administered 2020-09-28: 10 mg/h via RESPIRATORY_TRACT
  Filled 2020-09-28: qty 20

## 2020-09-28 NOTE — H&P (Addendum)
[facility] Admission History and Physical Service Pager: [phone]  Patient name: Lucas [name] Medical record number: [id number] Date of birth: 02/26/1951 Age: 68 y.o. Gender: male  Primary Care Provider: [name], DO Consultants: None Code Status: Full Preferred Emergency Contact: Wife Peggy  Chief Complaint: SOB  Assessment and Plan: Lucas [name] is a 69 y.o. male presenting with shortness of breath and chest tightness. PMH is significant for COPD, h/o respiratory failure, malignant neoplasm of lung RUL, CAD, h/o NSTEMI, s/p CABG x4, tobacco use, alcohol abuse and dependence, liver cirrhosis, h/o Mallory-Weiss tear, severe protein-calorie malnutrition, peripheral neuropathy, gastritis and gastroduodenitis.  Acute hypercapnic respiratory failure secondary to COPD exacerbation COPD Squamous Cell Carcinoma, RUL, s/p SBRT Patient arrived via EMS with shortness of breath and chest tightness.  Acute onset around 8 PM.  Home treatment with albuterol nebulizers failed to provide relief.  Patient called EMS.  While in route with EMS, received albuterol, Atrovent, Solu-Medrol, and was on 15 L nonrebreather.  In the ED, demonstrated 2-3 word dyspnea and was tripoding; placed on BiPAP and continuous albuterol nebulizer with improvement of his symptoms.  Initial lab work demonstrated troponin 18. BNP 44.3, lactic acid 2.4, magnesium 2.8.  ABG PCO2 65.8, PO2 143, acid base excess 3.0.  Covid and flu A/B negative.  Blood cultures drawn in ED.  EKG demonstrated NSR.  Chest x-ray showed no acute cardiopulmonary abnormality; however did show blunting of right costophrenic angle and flattening of bilateral hemidiaphragms consistent with emphysematous changes.  ED also started cefepime out of concern for possible developing pneumonia.  CBC unremarkable and showed WBC 6.1, hemoglobin 14.7.  Patient recently hospitalized on 09/06/2020 and admitted to ICU for severe shortness of  breath requiring intubation.  He was extubated the next day.  Patient has history of severe COPD on home O2 3 L/min, history of right upper lobe squamous cell carcinoma s/p SBRT.  Other differential includes pulmonary embolism, though less likely given hemodynamic stability.  No signs of DVT on exam. Wells score 1 for malignancy w/ treatment -Admit to progressive with Dr. Andria Frames attending -BiPAP until symptom improvement  -Wean oxygen as tolerated -Continuous pulse ox -Vitals per unit protocol -Continue home meds of albuterol inhaler as needed, Anoro Ellipta daily, and DuoNeb as needed every 6 -continue protocol for severe COPD exacerbation: cefepime 2 g q8 hr x 5 days(recent antibiotic exposure), LAMA + LABA, SABA PRN, prednisone x 5 days   CAD  h/o NSTEMI  s/p CABG x4 (12/16/19) Formal med rec not yet completed; however, previous admission med rec shows home meds include aspirin 81 mg daily, nitroglycerin tablet as needed, and rosuvastatin 40 mg daily. -Twelve-lead EKG as needed -Continue home medications as above, pending formal med rec  Alcohol abuse and dependence  liver cirrhosis  h/o Mallory-Weiss tear Formal med rec not yet completed; however, previous admission med rec shows home meds include folic acid 1 mg daily and thiamine 100 mg daily. ETOH negative.  -CIWA every 4 -Monitor for signs of blood loss -Vitamin B12, thiamine, and folic acid supplementation   Severe protein-calorie malnutrition  peripheral neuropathy  At home, receives Ensure Enlive 3 times daily in between meals. -Regular diet with protein supplementation as above -Consult dietitian, appreciate recommendations  Gastritis and gastroduodenitis Home meds include Pepcid 20 mg tablet twice daily.  Tobacco use -Nicotine replacement patch while inpatient  FEN/GI: N.p.o. while BiPAP Prophylaxis: Lovenox (creatinine clearance 65 mL/min)  Disposition: Progressive  History of Present Illness:  Lucas [name] is a  69 y.o. male presenting to ED via EMS with shortness of breath and chest tightness.  Acute onset around 8 PM.  Home treatment with albuterol nebulizers failed to provide relief.  Patient called EMS.  While in route with EMS, received albuterol, Atrovent, Solu-Medrol, and was on 15 L nonrebreather.  In the ED, demonstrated 2-3 word dyspnea and was tripoding; placed on BiPAP and continuous albuterol nebulizer with improvement of his symptoms.  History difficult to obtain as patient on BiPAP.  He is awake and alert and answering questions appropriately.  Review Of Systems: Per HPI with the following additions:   Review of Systems  Respiratory: Positive for cough, chest tightness, shortness of breath and wheezing.   Cardiovascular: Negative for chest pain.  Neurological: Negative for seizures, syncope and facial asymmetry.     Patient Active Problem List   Diagnosis Date Noted  . COPD (chronic obstructive pulmonary disease) ([place]) 09/28/2020  . Protein-calorie malnutrition, severe 09/09/2020  . COPD with acute exacerbation ([place]) 09/07/2020  . Malignant neoplasm of right upper lobe of lung ([place]) 05/26/2020  . Lung mass 05/04/2020  . Acute on chronic respiratory failure with hypoxia ([place]) 05/02/2020  . Pulmonary nodule 02/10/2020  . Pleural effusion, right   . S/P CABG x 4 12/16/2019  . Coronary artery disease 12/16/2019  . Unstable angina ([place]) 12/12/2019  . NSTEMI (non-ST elevated myocardial infarction) ([place]) 12/12/2019  . CAD (coronary artery disease) 07/11/2018  . H. pylori infection 07/03/2018  . Chronic stable angina ([place]) 07/03/2018  . Duodenal ulcer due to Helicobacter pylori   . Bilateral AVN of femurs ([place]) 06/25/2018  . Chronic cough 06/25/2018  . Mallory-Weiss tear   . Gastritis and gastroduodenitis   . Cirrhosis of liver ([place])   . Tubular adenoma of colon 01/24/2017  . Epistaxis 01/01/2015  . COPD exacerbation ([place]) 02/13/2013  . Ganglion cyst 01/03/2012  . PERIPHERAL  NEUROPATHY 07/15/2010  . Alcohol use disorder, severe, dependence ([place]) 05/14/2009  . DEFORMITY, FOOT NEC, CONGENITAL 08/22/2007  . ALLERGIC RHINITIS, SEASONAL 05/03/2007  . Hyperlipidemia 12/21/2006  . Tobacco abuse 12/21/2006  . COPD with chronic bronchitis ([place]) 12/21/2006    Past Medical History: Past Medical History:  Diagnosis Date  . Acute blood loss anemia 06/21/2018  . Acute upper GI bleed 06/21/2018  . Allergy   . Bilateral AVN of femurs ([place]) 06/25/2018   See 11/2017 Chest CT report  . COPD 12/21/2006  . Environmental allergies   . History of femur fracture 06/25/2018   11/2017 CT AP showed Remote nonunited right femur greater trochanter fracture  . History of Fracture of right tibia 06/26/2018  . History of multiple right thoacic rib fractures 06/25/2018  . History of Right clavicular fracture 06/25/2018  . HYPERLIPIDEMIA 12/21/2006  . HYPERTENSION, BENIGN SYSTEMIC 12/21/2006  . Mallory-Weiss tear   . TOBACCO DEPENDENCE 12/21/2006    Past Surgical History: Past Surgical History:  Procedure Laterality Date  . ANKLE SURGERY     left ankle  . APPLICATION OF WOUND VAC  01/06/2012   Procedure: APPLICATION OF WOUND VAC;  Surgeon: Meredith Pel, MD;  Location: [facility];  Service: Orthopedics;  Laterality: Left;  . APPLICATION OF WOUND VAC Right 10/17/2015   Procedure: APPLICATION OF WOUND VAC;  Surgeon: Leandrew Koyanagi, MD;  Location: [place];  Service: Orthopedics;  Laterality: Right;  . BIOPSY  06/22/2018   Procedure: BIOPSY;  Surgeon: [id number] v, DO;  Location: [place] [facility];  Service: Gastroenterology;;  .  BRONCHIAL BIOPSY  05/04/2020   Procedure: BRONCHIAL BIOPSIES;  Surgeon: Garner Nash, DO;  Location: [place];  Service: Pulmonary;;  . BRONCHIAL BRUSHINGS  05/04/2020   Procedure: BRONCHIAL BRUSHINGS;  Surgeon: Garner Nash, DO;  Location: [place] [facility];  Service: Pulmonary;;  . BRONCHIAL NEEDLE ASPIRATION BIOPSY  05/04/2020   Procedure: BRONCHIAL NEEDLE  ASPIRATION BIOPSIES;  Surgeon: Garner Nash, DO;  Location: [place];  Service: Pulmonary;;  . BRONCHIAL WASHINGS  05/04/2020   Procedure: BRONCHIAL WASHINGS;  Surgeon: Garner Nash, DO;  Location: [place];  Service: Pulmonary;;  . CORONARY ARTERY BYPASS GRAFT N/A 12/16/2019   Procedure: CORONARY ARTERY BYPASS GRAFTING (CABG) TIMES 4 USING LEFT INTERNAL MAMMARY ARTERY AND ENDOSCOPICALLY HARVESTED RIGHT SAPHENOUS VEIN;  Surgeon: Lajuana Matte, MD;  Location: [place];  Service: Open Heart Surgery;  Laterality: N/A;  . ESOPHAGOGASTRODUODENOSCOPY (EGD) WITH PROPOFOL N/A 06/22/2018   Procedure: ESOPHAGOGASTRODUODENOSCOPY (EGD) WITH PROPOFOL;  Surgeon: [id number] v, DO;  Location: [place] [facility];  Service: Gastroenterology;  Laterality: N/A;  . EXTERNAL FIXATION LEG  01/06/2012   Procedure: EXTERNAL FIXATION LEG;  Surgeon: Meredith Pel, MD;  [facility];  Service: Orthopedics;  Laterality: Left;  Smith-nephew external fixator set  . EXTERNAL FIXATION LEG Right 09/16/2015   Procedure: EXTERNAL FIXATION LEG/LARGE;  Surgeon: Mcarthur Rossetti, MD;  Location: [place];  Service: Orthopedics;  Laterality: Right;  . EXTERNAL FIXATION REMOVAL Right 09/22/2015   Procedure: REMOVAL EXTERNAL FIXATION LEG;  Surgeon: Mcarthur Rossetti, MD;  Location: [place];  Service: Orthopedics;  Laterality: Right;  . FASCIOTOMY  01/10/2012   Procedure: FASCIOTOMY;  Surgeon: Rozanna Box, MD;  Location: [place];  Service: Orthopedics;  Laterality: Left;  status post fasciotomy with wound vac left calf; adjustment external fixator left tibia under fluoro; retention suture/wound vac placement left lateral calf wound  . FASCIOTOMY  01/06/2012   Procedure: FASCIOTOMY;  Surgeon: Meredith Pel, MD;  [facility];  Service: Orthopedics;  Laterality: Left;  Four compartment fasciotomy  . FIDUCIAL MARKER PLACEMENT  05/04/2020   Procedure: FIDUCIAL MARKER PLACEMENT;  Surgeon: Garner Nash,  DO;  Location: [place] [facility];  Service: Pulmonary;;  . HOT HEMOSTASIS N/A 06/22/2018   Procedure: HEMOSTASIS;  Surgeon: Dorothea Ogle v, DO;  Location: [place];  Service: Gastroenterology;  Laterality: N/A;  Epinepherine and clip placed  . I & D EXTREMITY  01/12/2012   Procedure: IRRIGATION AND DEBRIDEMENT EXTREMITY;  Surgeon: Rozanna Box, MD;  Location: [place];  Service: Orthopedics;  Laterality: Left;  LAYERD CLOSURE LEFT LEG WOUND  . I & D EXTREMITY Right 10/17/2015   Procedure: IRRIGATION AND DEBRIDEMENT EXTREMITY;  Surgeon: Leandrew Koyanagi, MD;  Location: [place];  Service: Orthopedics;  Laterality: Right;  . I & D EXTREMITY Right 10/20/2015   Procedure: REPEAT IRRIGATION AND DEBRIDEMENT EXTREMITY, right lower leg;  Surgeon: Mcarthur Rossetti, MD;  Location: [place];  Service: Orthopedics;  Laterality: Right;  . KNEE SURGERY     left knee and right knee  . LEFT HEART CATH AND CORONARY ANGIOGRAPHY N/A 06/27/2018   Procedure: LEFT HEART CATH AND CORONARY ANGIOGRAPHY;  Surgeon: Martinique, Peter M, MD;  Location: [place] [facility];  Service: Cardiovascular;  Laterality: N/A;  . LEFT HEART CATH AND CORONARY ANGIOGRAPHY N/A 12/12/2019   Procedure: LEFT HEART CATH AND CORONARY ANGIOGRAPHY;  Surgeon: Wellington Hampshire, MD;  Location: [place] [facility];  Service: Cardiovascular;  Laterality: N/A;  . MASS EXCISION Left 09/22/2015  Procedure: EXCISION MASS;  Surgeon: Mcarthur Rossetti, MD;  Location: [place];  Service: Orthopedics;  Laterality: Left;  . ORIF TIBIA PLATEAU  02/07/2012   Procedure: OPEN REDUCTION INTERNAL FIXATION (ORIF) TIBIAL PLATEAU;  Surgeon: Rozanna Box, MD;  Location: [place];  Service: Orthopedics;  Laterality: Left;  ORIF LEFT TIBIAL PLATEAU/ REMOVE EXTERNAL FIXATION LEFT LEG  . ORIF TIBIA PLATEAU Right 09/22/2015   Procedure: REMOVAL OF EXTERNAL FIXATOR RIGHT LEG, OPEN REDUCTION INTERNAL FIXATION (ORIF) RIGHT TIBIAL PLATEAU, EXCISION SMALL MASS LEFT LEG;  Surgeon:  Mcarthur Rossetti, MD;  Location: [place];  Service: Orthopedics;  Laterality: Right;  . SKIN SPLIT GRAFT Right 10/23/2015   Procedure: RIGHT GASTROC FLAP SPLIT THICKNESS SKIN GRAFT FROM RIGHT THING TO RIGHT LEG;  Surgeon: Irene Limbo, MD;  Location: [place];  Service: Plastics;  Laterality: Right;  . TEE WITHOUT CARDIOVERSION N/A 12/16/2019   Procedure: TRANSESOPHAGEAL ECHOCARDIOGRAM (TEE);  Surgeon: Lajuana Matte, MD;  Location: [place];  Service: Open Heart Surgery;  Laterality: N/A;  . VIDEO BRONCHOSCOPY WITH ENDOBRONCHIAL NAVIGATION Right 05/04/2020   Procedure: VIDEO BRONCHOSCOPY WITH ENDOBRONCHIAL NAVIGATION;  Surgeon: Garner Nash, DO;  Location: [place];  Service: Pulmonary;  Laterality: Right;  . WRIST SURGERY     left    Social History: Social History   Tobacco Use  . Smoking status: Current Every Day Smoker    Packs/day: 0.50    Years: 51.00    Pack years: 25.50    Types: Cigarettes  . Smokeless tobacco: Former Systems developer    Types: Snuff, Chew    Quit date: 01/05/1965  . Tobacco comment: currently half a pack a day-05/26/2020  Vaping Use  . Vaping Use: Never used  Substance Use Topics  . Alcohol use: Yes    Alcohol/week: 6.0 standard drinks    Types: 6 Cans of beer per week    Comment: drinks about a 6-pack/day  . Drug use: No   Please also refer to relevant sections of EMR.  Family History: Family History  Problem Relation Age of Onset  . Emphysema Father     Allergies and Medications: Allergies  Allergen Reactions  . Ace Inhibitors Swelling  . Ampicillin Nausea And Vomiting  . Penicillins Nausea And Vomiting    Did it involve swelling of the face/tongue/throat, SOB, or low BP? N Did it involve sudden or severe rash/hives, skin peeling, or any reaction on the inside of your mouth or nose? N Did you need to seek medical attention at a hospital or doctor's office? N When did it last happen?Teenager If all above answers are "NO", may  proceed with cephalosporin use.  . Codeine Nausea And Vomiting  . Lisinopril Swelling    REACTION: angioedema   No current facility-administered medications on file prior to encounter.   Current Outpatient Medications on File Prior to Encounter  Medication Sig Dispense Refill  . albuterol (PROAIR HFA) 108 (90 Base) MCG/ACT inhaler INHALE 2 PUFFS BY MOUTH EVERY 4 HOURS AS NEEDED FOR WHEEZE OR FOR SHORTNESS OF BREATH (Patient taking differently: Inhale 2 puffs into the lungs every 4 (four) hours as needed for wheezing or shortness of breath. ) 6.7 g 6  . ANORO ELLIPTA 62.5-25 MCG/INH AEPB TAKE 1 PUFF BY MOUTH EVERY DAY (Patient taking differently: Inhale 1 puff into the lungs daily. ) 60 each 3  . aspirin EC 81 MG tablet Take 81 mg by mouth daily.  (Patient not taking: Reported on 09/14/2020)    .  bisacodyl (BISACODYL) 5 MG EC tablet Take 15 mg by mouth daily as needed for moderate constipation.     . famotidine (PEPCID) 20 MG tablet Take 1 tablet (20 mg total) by mouth 2 (two) times daily. (Patient not taking: Reported on 09/14/2020) 60 tablet 0  . feeding supplement (ENSURE ENLIVE / ENSURE PLUS) LIQD Take 237 mLs by mouth 3 (three) times daily between meals. 21330 mL 0  . fluticasone (FLONASE) 50 MCG/ACT nasal spray SPRAY 2 SPRAYS INTO EACH NOSTRIL EVERY DAY (Patient taking differently: Place 1 spray into both nostrils daily as needed for allergies. ) 16 g 2  . folic acid (FOLVITE) 1 MG tablet Take 1 tablet (1 mg total) by mouth daily. (Patient not taking: Reported on 09/14/2020) 30 tablet 0  . ipratropium-albuterol (DUONEB) 0.5-2.5 (3) MG/3ML SOLN Take 3 mLs by nebulization every 6 (six) hours as needed. (Patient not taking: Reported on 09/14/2020) 360 mL 0  . metoprolol tartrate (LOPRESSOR) 25 MG tablet Take 1 tablet (25 mg total) by mouth 2 (two) times daily. (Patient not taking: Reported on 09/14/2020) 30 tablet 0  . nitroGLYCERIN (NITROSTAT) 0.4 MG SL tablet PLACE 1 TABLET (0.4 MG TOTAL)  UNDER THE TONGUE EVERY 5 (FIVE) MINUTES AS NEEDED FOR CHEST PAIN. 150 tablet 1  . OXYGEN Inhale 3-4 L into the lungs See admin instructions. Continuous at night, as needed during the day    . oxymetazoline (AFRIN) 0.05 % nasal spray Place 1 spray into both nostrils 2 (two) times daily as needed for congestion (nose bleeds).     . predniSONE (DELTASONE) 10 MG tablet Take 40 mg daily for 1 day, 30 mg daily for 1 day, 20 mg daily for 1 days,10 mg daily for 1 day, then stop (Patient not taking: Reported on 09/14/2020) 10 tablet 0  . rosuvastatin (CRESTOR) 40 MG tablet Take 1 tablet (40 mg total) by mouth daily. (Patient not taking: Reported on 05/26/2020) 90 tablet 3  . thiamine 100 MG tablet Take 1 tablet (100 mg total) by mouth daily. (Patient not taking: Reported on 09/14/2020) 30 tablet 0  . [DISCONTINUED] loratadine (CLARITIN) 10 MG tablet Take 1 tablet (10 mg total) by mouth daily. 30 tablet 6  . [DISCONTINUED] metoprolol tartrate (LOPRESSOR) 25 MG tablet Take 1 tablet (25 mg total) by mouth 2 (two) times daily. 180 tablet 0    Objective: BP 138/72   Pulse 90   Temp 98.3 F (36.8 C) (Oral)   Resp 15   Ht 5\' 11"  (1.803 m)   Wt 59.5 kg   SpO2 100%   BMI 18.30 kg/m  Exam: General: Patient completely covered with blanket, blanket wrapped around head, awake, alert, oriented, difficult to hear over BiPAP Cardiovascular: Regular rate and rhythm, no murmurs appreciated Respiratory: Decreased air movement throughout bilateral posterior lung fields, mild respiratory distress Neuro: Moving all extremities spontaneously  Labs and Imaging: CBC BMET  Recent Labs  Lab 09/28/20 2121 09/28/20 2121 09/28/20 2152  WBC 6.1  --   --   HGB 14.7   < > 15.3  HCT 43.9   < > 45.0  PLT 211  --   --    < > = values in this interval not displayed.   Recent Labs  Lab 09/28/20 2121 09/28/20 2121 09/28/20 2152  NA 143   < > 141  K 4.5   < > 4.3  CL 100  --   --   CO2 33*  --   --  BUN 9  --   --    CREATININE 0.89  --   --   GLUCOSE 96  --   --   CALCIUM 9.8  --   --    < > = values in this interval not displayed.     EKG: Normal sinus rhythm, normal axis, appropriate R wave progression V1-6, no ST elevation  PORTABLE CHEST 1 VIEW 09/28/2020 COMPARISON:  Chest x-ray 09/06/2020, CT chest 07/24/2020 FINDINGS: Coronary artery bypass surgical changes noted overlying the mediastinum. Vascular clips noted overlying the right hemithorax. The heart size and mediastinal contours are unchanged. Interval resolution of right upper lobe interstitial coarsening. Atelectasis at the left base. No pulmonary edema. Similar-appearing blunting of the right costophrenic angle. Flattening of bilateral hemidiaphragms consistent with emphysematous changes. No pleural effusion. No pneumothorax. No acute osseous abnormality. Sternotomy.  Old healed right rib fractures. IMPRESSION: No acute cardiopulmonary abnormality.   Ezequiel Essex, MD 09/28/2020, 11:05 PM PGY-1, [place] Intern pager: [phone], text pages welcome  FPTS Upper-Level Resident Addendum I have independently interviewed and examined the patient. I have discussed the above with the original author and agree with their documentation. My edits for correction/addition/clarification are in - dark green. Please see also any attending notes.  [place] Service pager: [phone] (text pages welcome through [facility])  Wilber Oliphant, M.D.  PGY-3 09/29/2020 3:41 AM

## 2020-09-28 NOTE — ED Provider Notes (Signed)
[facility] EMERGENCY DEPARTMENT Provider Note   CSN: [id number] Arrival date & time: 09/28/20  2110     History Chief Complaint  Patient presents with  . Shortness of Breath    EDRAS WILFORD is a 69 y.o. male with a PMHx of COPD on 3 L [place], RUL squamous cell lung carcinoma, CAD s/p CABG, HTN presents with acute hypoxic respiratory failure.  Per patient and EMS, had acute onset shortness of breath about 1 hour prior to arrival.  Tried his home albuterol puffs and nebulizer which gave him minimal relief.  Per EMS, given 10 mg albuterol, Atrovent, 2 g mag, and 125 mg Solu-Medrol in route.  Per EMS, he was diaphoretic and in severe acute distress on their arrival but has significantly improved in route and now has some wheezing indicating better air movement.  The history is provided by the patient and the EMS personnel. The history is limited by the condition of the patient.  Shortness of Breath Severity:  Severe Onset quality:  Sudden Duration:  1 hour Timing:  Constant Progression:  Improving Chronicity:  Recurrent Context: not URI   Relieved by: Albuterol, Atrovent, mag, Solu-Medrol. Exacerbated by: Unable to specify. Ineffective treatments: Home inhaler, home nebulizer. Associated symptoms: chest pain (Chest tightness)   Associated symptoms: no cough, no fever and no sputum production        Past Medical History:  Diagnosis Date  . Acute blood loss anemia 06/21/2018  . Acute upper GI bleed 06/21/2018  . Allergy   . Bilateral AVN of femurs ([place]) 06/25/2018   See 11/2017 Chest CT report  . COPD 12/21/2006  . Environmental allergies   . History of femur fracture 06/25/2018   11/2017 CT AP showed Remote nonunited right femur greater trochanter fracture  . History of Fracture of right tibia 06/26/2018  . History of multiple right thoacic rib fractures 06/25/2018  . History of Right clavicular fracture 06/25/2018  . HYPERLIPIDEMIA 12/21/2006  . HYPERTENSION, BENIGN  SYSTEMIC 12/21/2006  . Mallory-Weiss tear   . TOBACCO DEPENDENCE 12/21/2006    Patient Active Problem List   Diagnosis Date Noted  . COPD (chronic obstructive pulmonary disease) ([place]) 09/28/2020  . Protein-calorie malnutrition, severe 09/09/2020  . COPD with acute exacerbation ([place]) 09/07/2020  . Malignant neoplasm of right upper lobe of lung ([place]) 05/26/2020  . Lung mass 05/04/2020  . Acute on chronic respiratory failure with hypoxia ([place]) 05/02/2020  . Pulmonary nodule 02/10/2020  . Pleural effusion, right   . S/P CABG x 4 12/16/2019  . Coronary artery disease 12/16/2019  . Unstable angina ([place]) 12/12/2019  . NSTEMI (non-ST elevated myocardial infarction) ([place]) 12/12/2019  . CAD (coronary artery disease) 07/11/2018  . H. pylori infection 07/03/2018  . Chronic stable angina ([place]) 07/03/2018  . Duodenal ulcer due to Helicobacter pylori   . Bilateral AVN of femurs ([place]) 06/25/2018  . Chronic cough 06/25/2018  . Mallory-Weiss tear   . Gastritis and gastroduodenitis   . Cirrhosis of liver ([place])   . Tubular adenoma of colon 01/24/2017  . Epistaxis 01/01/2015  . COPD exacerbation ([place]) 02/13/2013  . Ganglion cyst 01/03/2012  . PERIPHERAL NEUROPATHY 07/15/2010  . Alcohol use disorder, severe, dependence ([place]) 05/14/2009  . DEFORMITY, FOOT NEC, CONGENITAL 08/22/2007  . ALLERGIC RHINITIS, SEASONAL 05/03/2007  . Hyperlipidemia 12/21/2006  . Tobacco abuse 12/21/2006  . COPD with chronic bronchitis ([place]) 12/21/2006    Past Surgical History:  Procedure Laterality Date  . ANKLE SURGERY  left ankle  . APPLICATION OF WOUND VAC  01/06/2012   Procedure: APPLICATION OF WOUND VAC;  Surgeon: Meredith Pel, MD;  Location: [facility];  Service: Orthopedics;  Laterality: Left;  . APPLICATION OF WOUND VAC Right 10/17/2015   Procedure: APPLICATION OF WOUND VAC;  Surgeon: Leandrew Koyanagi, MD;  Location: [place];  Service: Orthopedics;  Laterality: Right;  . BIOPSY  06/22/2018   Procedure: BIOPSY;   Surgeon: [id number] v, DO;  Location: [place] [facility];  Service: Gastroenterology;;  . BRONCHIAL BIOPSY  05/04/2020   Procedure: BRONCHIAL BIOPSIES;  Surgeon: Garner Nash, DO;  Location: [place] [facility];  Service: Pulmonary;;  . BRONCHIAL BRUSHINGS  05/04/2020   Procedure: BRONCHIAL BRUSHINGS;  Surgeon: Garner Nash, DO;  Location: [place] [facility];  Service: Pulmonary;;  . BRONCHIAL NEEDLE ASPIRATION BIOPSY  05/04/2020   Procedure: BRONCHIAL NEEDLE ASPIRATION BIOPSIES;  Surgeon: Garner Nash, DO;  Location: [place];  Service: Pulmonary;;  . BRONCHIAL WASHINGS  05/04/2020   Procedure: BRONCHIAL WASHINGS;  Surgeon: Garner Nash, DO;  Location: [place];  Service: Pulmonary;;  . CORONARY ARTERY BYPASS GRAFT N/A 12/16/2019   Procedure: CORONARY ARTERY BYPASS GRAFTING (CABG) TIMES 4 USING LEFT INTERNAL MAMMARY ARTERY AND ENDOSCOPICALLY HARVESTED RIGHT SAPHENOUS VEIN;  Surgeon: Lajuana Matte, MD;  Location: [place];  Service: Open Heart Surgery;  Laterality: N/A;  . ESOPHAGOGASTRODUODENOSCOPY (EGD) WITH PROPOFOL N/A 06/22/2018   Procedure: ESOPHAGOGASTRODUODENOSCOPY (EGD) WITH PROPOFOL;  Surgeon: [id number] v, DO;  Location: [place] [facility];  Service: Gastroenterology;  Laterality: N/A;  . EXTERNAL FIXATION LEG  01/06/2012   Procedure: EXTERNAL FIXATION LEG;  Surgeon: Meredith Pel, MD;  [facility];  Service: Orthopedics;  Laterality: Left;  Smith-nephew external fixator set  . EXTERNAL FIXATION LEG Right 09/16/2015   Procedure: EXTERNAL FIXATION LEG/LARGE;  Surgeon: Mcarthur Rossetti, MD;  Location: [place];  Service: Orthopedics;  Laterality: Right;  . EXTERNAL FIXATION REMOVAL Right 09/22/2015   Procedure: REMOVAL EXTERNAL FIXATION LEG;  Surgeon: Mcarthur Rossetti, MD;  Location: [place];  Service: Orthopedics;  Laterality: Right;  . FASCIOTOMY  01/10/2012   Procedure: FASCIOTOMY;  Surgeon: Rozanna Box, MD;  Location: [place];  Service: Orthopedics;   Laterality: Left;  status post fasciotomy with wound vac left calf; adjustment external fixator left tibia under fluoro; retention suture/wound vac placement left lateral calf wound  . FASCIOTOMY  01/06/2012   Procedure: FASCIOTOMY;  Surgeon: Meredith Pel, MD;  [facility];  Service: Orthopedics;  Laterality: Left;  Four compartment fasciotomy  . FIDUCIAL MARKER PLACEMENT  05/04/2020   Procedure: FIDUCIAL MARKER PLACEMENT;  Surgeon: Garner Nash, DO;  Location: [place] [facility];  Service: Pulmonary;;  . HOT HEMOSTASIS N/A 06/22/2018   Procedure: HEMOSTASIS;  Surgeon: Dorothea Ogle v, DO;  Location: [place];  Service: Gastroenterology;  Laterality: N/A;  Epinepherine and clip placed  . I & D EXTREMITY  01/12/2012   Procedure: IRRIGATION AND DEBRIDEMENT EXTREMITY;  Surgeon: Rozanna Box, MD;  Location: [place];  Service: Orthopedics;  Laterality: Left;  LAYERD CLOSURE LEFT LEG WOUND  . I & D EXTREMITY Right 10/17/2015   Procedure: IRRIGATION AND DEBRIDEMENT EXTREMITY;  Surgeon: Leandrew Koyanagi, MD;  Location: [place];  Service: Orthopedics;  Laterality: Right;  . I & D EXTREMITY Right 10/20/2015   Procedure: REPEAT IRRIGATION AND DEBRIDEMENT EXTREMITY, right lower leg;  Surgeon: Mcarthur Rossetti, MD;  Location: [place];  Service: Orthopedics;  Laterality: Right;  . KNEE SURGERY     left  knee and right knee  . LEFT HEART CATH AND CORONARY ANGIOGRAPHY N/A 06/27/2018   Procedure: LEFT HEART CATH AND CORONARY ANGIOGRAPHY;  Surgeon: Martinique, Peter M, MD;  Location: [place] [facility];  Service: Cardiovascular;  Laterality: N/A;  . LEFT HEART CATH AND CORONARY ANGIOGRAPHY N/A 12/12/2019   Procedure: LEFT HEART CATH AND CORONARY ANGIOGRAPHY;  Surgeon: Wellington Hampshire, MD;  Location: [place] [facility];  Service: Cardiovascular;  Laterality: N/A;  . MASS EXCISION Left 09/22/2015   Procedure: EXCISION MASS;  Surgeon: Mcarthur Rossetti, MD;  Location: [place];  Service: Orthopedics;   Laterality: Left;  . ORIF TIBIA PLATEAU  02/07/2012   Procedure: OPEN REDUCTION INTERNAL FIXATION (ORIF) TIBIAL PLATEAU;  Surgeon: Rozanna Box, MD;  Location: [place];  Service: Orthopedics;  Laterality: Left;  ORIF LEFT TIBIAL PLATEAU/ REMOVE EXTERNAL FIXATION LEFT LEG  . ORIF TIBIA PLATEAU Right 09/22/2015   Procedure: REMOVAL OF EXTERNAL FIXATOR RIGHT LEG, OPEN REDUCTION INTERNAL FIXATION (ORIF) RIGHT TIBIAL PLATEAU, EXCISION SMALL MASS LEFT LEG;  Surgeon: Mcarthur Rossetti, MD;  Location: [place];  Service: Orthopedics;  Laterality: Right;  . SKIN SPLIT GRAFT Right 10/23/2015   Procedure: RIGHT GASTROC FLAP SPLIT THICKNESS SKIN GRAFT FROM RIGHT THING TO RIGHT LEG;  Surgeon: Irene Limbo, MD;  Location: [place];  Service: Plastics;  Laterality: Right;  . TEE WITHOUT CARDIOVERSION N/A 12/16/2019   Procedure: TRANSESOPHAGEAL ECHOCARDIOGRAM (TEE);  Surgeon: Lajuana Matte, MD;  Location: [place];  Service: Open Heart Surgery;  Laterality: N/A;  . VIDEO BRONCHOSCOPY WITH ENDOBRONCHIAL NAVIGATION Right 05/04/2020   Procedure: VIDEO BRONCHOSCOPY WITH ENDOBRONCHIAL NAVIGATION;  Surgeon: Garner Nash, DO;  Location: [place];  Service: Pulmonary;  Laterality: Right;  . WRIST SURGERY     left       Family History  Problem Relation Age of Onset  . Emphysema Father     Social History   Tobacco Use  . Smoking status: Current Every Day Smoker    Packs/day: 0.50    Years: 51.00    Pack years: 25.50    Types: Cigarettes  . Smokeless tobacco: Former Systems developer    Types: Snuff, Chew    Quit date: 01/05/1965  . Tobacco comment: currently half a pack a day-05/26/2020  Vaping Use  . Vaping Use: Never used  Substance Use Topics  . Alcohol use: Yes    Alcohol/week: 6.0 standard drinks    Types: 6 Cans of beer per week    Comment: drinks about a 6-pack/day  . Drug use: No    Home Medications Prior to Admission medications   Medication Sig Start Date End Date Taking? Authorizing  Provider  albuterol (PROAIR HFA) 108 (90 Base) MCG/ACT inhaler INHALE 2 PUFFS BY MOUTH EVERY 4 HOURS AS NEEDED FOR WHEEZE OR FOR SHORTNESS OF BREATH Patient taking differently: Inhale 2 puffs into the lungs every 4 (four) hours as needed for wheezing or shortness of breath.  02/08/20   Brimage, Vondra, DO  ANORO ELLIPTA 62.5-25 MCG/INH AEPB TAKE 1 PUFF BY MOUTH EVERY DAY Patient taking differently: Inhale 1 puff into the lungs daily.  07/16/20   [name], DO  aspirin EC 81 MG tablet Take 81 mg by mouth daily.  Patient not taking: Reported on 09/14/2020    [provider]  bisacodyl (BISACODYL) 5 MG EC tablet Take 15 mg by mouth daily as needed for moderate constipation.     [provider]  famotidine (PEPCID) 20 MG tablet Take  1 tablet (20 mg total) by mouth 2 (two) times daily. Patient not taking: Reported on 09/14/2020 09/10/20   Jonetta Osgood, MD  feeding supplement (ENSURE ENLIVE / ENSURE PLUS) LIQD Take 237 mLs by mouth 3 (three) times daily between meals. 09/10/20 10/10/20  Ghimire, Henreitta Leber, MD  fluticasone (FLONASE) 50 MCG/ACT nasal spray SPRAY 2 SPRAYS INTO EACH NOSTRIL EVERY DAY Patient taking differently: Place 1 spray into both nostrils daily as needed for allergies.  07/19/18   Steve Rattler, DO  folic acid (FOLVITE) 1 MG tablet Take 1 tablet (1 mg total) by mouth daily. Patient not taking: Reported on 09/14/2020 09/10/20   Jonetta Osgood, MD  ipratropium-albuterol (DUONEB) 0.5-2.5 (3) MG/3ML SOLN Take 3 mLs by nebulization every 6 (six) hours as needed. Patient not taking: Reported on 09/14/2020 09/10/20   Jonetta Osgood, MD  metoprolol tartrate (LOPRESSOR) 25 MG tablet Take 1 tablet (25 mg total) by mouth 2 (two) times daily. Patient not taking: Reported on 09/14/2020 09/10/20   Jonetta Osgood, MD  nitroGLYCERIN (NITROSTAT) 0.4 MG SL tablet PLACE 1 TABLET (0.4 MG TOTAL) UNDER THE TONGUE EVERY 5 (FIVE) MINUTES AS NEEDED FOR CHEST PAIN.  03/02/20   Brimage, Ronnette Juniper, DO  OXYGEN Inhale 3-4 L into the lungs See admin instructions. Continuous at night, as needed during the day    [provider]  oxymetazoline (AFRIN) 0.05 % nasal spray Place 1 spray into both nostrils 2 (two) times daily as needed for congestion (nose bleeds).     [provider]  predniSONE (DELTASONE) 10 MG tablet Take 40 mg daily for 1 day, 30 mg daily for 1 day, 20 mg daily for 1 days,10 mg daily for 1 day, then stop Patient not taking: Reported on 09/14/2020 09/10/20   Jonetta Osgood, MD  rosuvastatin (CRESTOR) 40 MG tablet Take 1 tablet (40 mg total) by mouth daily. Patient not taking: Reported on 05/26/2020 02/08/20   [name], DO  thiamine 100 MG tablet Take 1 tablet (100 mg total) by mouth daily. Patient not taking: Reported on 09/14/2020 09/10/20   Jonetta Osgood, MD  loratadine (CLARITIN) 10 MG tablet Take 1 tablet (10 mg total) by mouth daily. 03/04/11 05/23/18  Mariana Arn, MD  metoprolol tartrate (LOPRESSOR) 25 MG tablet Take 1 tablet (25 mg total) by mouth 2 (two) times daily. 05/29/19   [name], DO    Allergies    Ace inhibitors, Ampicillin, Penicillins, Codeine, and Lisinopril  Review of Systems   Review of Systems  Unable to perform ROS: Severe respiratory distress  Constitutional: Negative for fever.  Respiratory: Positive for chest tightness and shortness of breath. Negative for cough and sputum production.   Cardiovascular: Positive for chest pain (Chest tightness).    Physical Exam Updated Vital Signs BP 138/72   Pulse 90   Temp 98.3 F (36.8 C) (Oral)   Resp 15   Ht 5\' 11"  (1.803 m)   Wt 59.5 kg   SpO2 100%   BMI 18.30 kg/m   Physical Exam Vitals and nursing note reviewed.  Constitutional:      General: He is in acute distress.     Appearance: He is well-developed. He is ill-appearing. He is not toxic-appearing or diaphoretic.     Interventions: He is not intubated. HENT:     Head:  Normocephalic and atraumatic.  Eyes:     Conjunctiva/sclera: Conjunctivae normal.  Cardiovascular:     Rate and Rhythm: Normal rate and regular  rhythm.     Heart sounds: No murmur heard.   Pulmonary:     Effort: Tachypnea, accessory muscle usage and respiratory distress present. He is not intubated.     Breath sounds: No stridor. Examination of the left-upper field reveals wheezing. Decreased breath sounds and wheezing present. No rhonchi or rales.  Chest:     Chest wall: No tenderness or crepitus.  Abdominal:     Palpations: Abdomen is soft.     Tenderness: There is no abdominal tenderness.  Musculoskeletal:     Cervical back: Neck supple.     Right lower leg: No tenderness. No edema.     Left lower leg: No tenderness. No edema.  Skin:    General: Skin is warm and dry.  Neurological:     Mental Status: He is alert and oriented to person, place, and time. Mental status is at baseline.     ED Results / Procedures / Treatments   Labs (all labs ordered are listed, but only abnormal results are displayed) Labs Reviewed  COMPREHENSIVE METABOLIC PANEL - Abnormal; Notable for the following components:      Result Value   CO2 33 (*)    Total Protein 8.3 (*)    AST 74 (*)    ALT 45 (*)    All other components within normal limits  MAGNESIUM - Abnormal; Notable for the following components:   Magnesium 2.8 (*)    All other components within normal limits  I-STAT VENOUS BLOOD GAS, ED - Abnormal; Notable for the following components:   pCO2, Ven 65.8 (*)    pO2, Ven 143.0 (*)    Bicarbonate 32.2 (*)    TCO2 34 (*)    Acid-Base Excess 3.0 (*)    All other components within normal limits  TROPONIN I (HIGH SENSITIVITY) - Abnormal; Notable for the following components:   Troponin I (High Sensitivity) 18 (*)    All other components within normal limits  RESP PANEL BY RT-PCR (FLU A&B, COVID) [id number]  CULTURE, BLOOD (ROUTINE X 2)  CULTURE, BLOOD (ROUTINE X 2)  CBC WITH  DIFFERENTIAL/PLATELET  BRAIN NATRIURETIC PEPTIDE  LACTIC ACID, PLASMA  LACTIC ACID, PLASMA  TROPONIN I (HIGH SENSITIVITY)    EKG None  Radiology DG Chest Portable 1 View  Result Date: 09/28/2020 CLINICAL DATA:  Shortness of breath, COPD. History of lung cancer and CABG. EXAM: PORTABLE CHEST 1 VIEW COMPARISON:  Chest x-ray 09/06/2020, CT chest 07/24/2020 FINDINGS: Coronary artery bypass surgical changes noted overlying the mediastinum. Vascular clips noted overlying the right hemithorax. The heart size and mediastinal contours are unchanged. Interval resolution of right upper lobe interstitial coarsening. Atelectasis at the left base. No pulmonary edema. Similar-appearing blunting of the right costophrenic angle. Flattening of bilateral hemidiaphragms consistent with emphysematous changes. No pleural effusion. No pneumothorax. No acute osseous abnormality. Sternotomy.  Old healed right rib fractures. IMPRESSION: No acute cardiopulmonary abnormality. Electronically Signed   By: Iven Finn M.D.   On: 09/28/2020 21:45    Procedures Procedures (including critical care time)  Medications Ordered in ED Medications  ceFEPIme (MAXIPIME) 2 g in sodium chloride 0.9 % 100 mL IVPB (has no administration in time range)  albuterol (PROVENTIL,VENTOLIN) solution continuous neb (10 mg/hr Nebulization Given 09/28/20 2153)    ED Course  I have reviewed the triage vital signs and the nursing notes.  Pertinent labs & imaging results that were available during my care of the patient were reviewed by me and considered in my medical decision  making (see chart for details).  Clinical Course as of Sep 28 2312  Mon Sep 28, 2020  2132 Placed on BiPAP, continuous albuterol   [CH]    Clinical Course User Index [CH] Darrick Huntsman, MD   MDM Rules/Calculators/A&P                          MDM: Markese Bloxham is a 69 y.o. male who presents with shortness of breath as per above. I have reviewed the nursing  documentation for past medical history, family history, and social history. Pertinent previous records reviewed. He is awake, alert.  Tachypneic but satting well on 15 L NRB. Afebrile. Physical exam is most notable for increased work of breathing, accessory muscle usage, tachypnea, decreased air movement bilaterally with minimal wheezing.  Labs: VBG 7.298/66, BNP 44, troponin 18, CBC unremarkable, CMP with a bicarb of 33 but otherwise unremarkable. EKG: pending at time of handoff to inpatient team, family medicine aware. Imaging: CXR demonstrating no acute cardiopulmonary abnormality Consults: Family medicine for admission Tx: Continuous albuterol on BiPAP, 2 g cefepime ordered  Differential Dx: I am most concerned for severe COPD exacerbation with acute on chronic hypoxic respiratory failure.  I would also be concerned for healthcare acquired pneumonia given admission 2 weeks ago and high risk for Pseudomonas.  Given history, physical exam, and work-up, I do not think he has ACS, PE, pneumothorax, rib fracture, esophageal pathology, aortic dissection or aneurysm, or trauma.  MDM: NORVAL SLAVEN is a 69 y.o. male with history of COPD and CAD presents in acute hypoxic respiratory failure with increased work of breathing, tachypnea, and decreased air movement bilaterally.  On initial presentation, patient tripoding but satting well on 15 L NRB.  Patient placed on BiPAP and continuous albuterol.  On serial reassessments, patient improving.  Mental status stable.  Increased work of breathing significantly improved.  Minimal air movement bilaterally but patient reports feeling significantly better.  Due to concern for severe COPD observation with possible healthcare acquired pneumonia and high risk for Pseudomonas, cefepime ordered.  Family medicine consulted for admission, handoff given to Dr. Jeani Hawking.  Admitted in stable condition on BiPAP.  The plan for this patient was discussed with Dr. Ashok Cordia, who voiced  agreement and who oversaw evaluation and treatment of this patient.   Final Clinical Impression(s) / ED Diagnoses Final diagnoses:  None    Rx / DC Orders ED Discharge Orders    None       Abdon Petrosky, MD 09/28/20 2313    Lajean Saver, MD 09/29/20 [phone]

## 2020-09-28 NOTE — ED Triage Notes (Signed)
Patient arrives with [facility], hx of COPD, CABG and lung cancer, silent chest for EMS, 4L [place] at home, placed on NRB, diaphoretic, tried to use home albuterol with no relief   EMS 178/PALP 104 HR 100% 8 NEB + 6 [place] 40 RR   18 L FA 2G MAG 125 MG SOL 10 MG ALBUTEROL 1 ATROVENT

## 2020-09-28 NOTE — ED Notes (Signed)
[phone] wife wants an update

## 2020-09-29 ENCOUNTER — Other Ambulatory Visit: Payer: Self-pay

## 2020-09-29 DIAGNOSIS — I10 Essential (primary) hypertension: Secondary | ICD-10-CM | POA: Diagnosis present

## 2020-09-29 DIAGNOSIS — Z951 Presence of aortocoronary bypass graft: Secondary | ICD-10-CM | POA: Diagnosis not present

## 2020-09-29 DIAGNOSIS — Z880 Allergy status to penicillin: Secondary | ICD-10-CM | POA: Diagnosis not present

## 2020-09-29 DIAGNOSIS — K7460 Unspecified cirrhosis of liver: Secondary | ICD-10-CM | POA: Diagnosis present

## 2020-09-29 DIAGNOSIS — J189 Pneumonia, unspecified organism: Secondary | ICD-10-CM | POA: Diagnosis present

## 2020-09-29 DIAGNOSIS — I2510 Atherosclerotic heart disease of native coronary artery without angina pectoris: Secondary | ICD-10-CM | POA: Diagnosis present

## 2020-09-29 DIAGNOSIS — J449 Chronic obstructive pulmonary disease, unspecified: Secondary | ICD-10-CM | POA: Diagnosis present

## 2020-09-29 DIAGNOSIS — Z515 Encounter for palliative care: Secondary | ICD-10-CM | POA: Diagnosis not present

## 2020-09-29 DIAGNOSIS — E43 Unspecified severe protein-calorie malnutrition: Secondary | ICD-10-CM

## 2020-09-29 DIAGNOSIS — J441 Chronic obstructive pulmonary disease with (acute) exacerbation: Secondary | ICD-10-CM | POA: Diagnosis present

## 2020-09-29 DIAGNOSIS — Z20822 Contact with and (suspected) exposure to covid-19: Secondary | ICD-10-CM | POA: Diagnosis present

## 2020-09-29 DIAGNOSIS — Z79899 Other long term (current) drug therapy: Secondary | ICD-10-CM | POA: Diagnosis not present

## 2020-09-29 DIAGNOSIS — C3411 Malignant neoplasm of upper lobe, right bronchus or lung: Secondary | ICD-10-CM | POA: Diagnosis present

## 2020-09-29 DIAGNOSIS — G629 Polyneuropathy, unspecified: Secondary | ICD-10-CM | POA: Diagnosis present

## 2020-09-29 DIAGNOSIS — F1020 Alcohol dependence, uncomplicated: Secondary | ICD-10-CM | POA: Diagnosis present

## 2020-09-29 DIAGNOSIS — Z8719 Personal history of other diseases of the digestive system: Secondary | ICD-10-CM | POA: Diagnosis not present

## 2020-09-29 DIAGNOSIS — I252 Old myocardial infarction: Secondary | ICD-10-CM | POA: Diagnosis not present

## 2020-09-29 DIAGNOSIS — Z7189 Other specified counseling: Secondary | ICD-10-CM | POA: Diagnosis not present

## 2020-09-29 DIAGNOSIS — Z885 Allergy status to narcotic agent status: Secondary | ICD-10-CM | POA: Diagnosis not present

## 2020-09-29 DIAGNOSIS — Z7982 Long term (current) use of aspirin: Secondary | ICD-10-CM | POA: Diagnosis not present

## 2020-09-29 DIAGNOSIS — Z8711 Personal history of peptic ulcer disease: Secondary | ICD-10-CM | POA: Diagnosis not present

## 2020-09-29 DIAGNOSIS — Z9981 Dependence on supplemental oxygen: Secondary | ICD-10-CM | POA: Diagnosis not present

## 2020-09-29 DIAGNOSIS — R911 Solitary pulmonary nodule: Secondary | ICD-10-CM

## 2020-09-29 DIAGNOSIS — F17210 Nicotine dependence, cigarettes, uncomplicated: Secondary | ICD-10-CM | POA: Diagnosis present

## 2020-09-29 DIAGNOSIS — Z720 Tobacco use: Secondary | ICD-10-CM

## 2020-09-29 DIAGNOSIS — Z888 Allergy status to other drugs, medicaments and biological substances status: Secondary | ICD-10-CM | POA: Diagnosis not present

## 2020-09-29 DIAGNOSIS — J9621 Acute and chronic respiratory failure with hypoxia: Secondary | ICD-10-CM | POA: Diagnosis present

## 2020-09-29 DIAGNOSIS — R64 Cachexia: Secondary | ICD-10-CM | POA: Diagnosis present

## 2020-09-29 DIAGNOSIS — Z681 Body mass index (BMI) 19 or less, adult: Secondary | ICD-10-CM | POA: Diagnosis not present

## 2020-09-29 LAB — CBC
HCT: 38.3 % — ABNORMAL LOW (ref 39.0–52.0)
Hemoglobin: 13.2 g/dL (ref 13.0–17.0)
MCH: 32.7 pg (ref 26.0–34.0)
MCHC: 34.5 g/dL (ref 30.0–36.0)
MCV: 94.8 fL (ref 80.0–100.0)
Platelets: 189 10*3/uL (ref 150–400)
RBC: 4.04 MIL/uL — ABNORMAL LOW (ref 4.22–5.81)
RDW: 13.2 % (ref 11.5–15.5)
WBC: 2.2 10*3/uL — ABNORMAL LOW (ref 4.0–10.5)
nRBC: 0 % (ref 0.0–0.2)

## 2020-09-29 LAB — COMPREHENSIVE METABOLIC PANEL
ALT: 40 U/L (ref 0–44)
ALT: 37 U/L (ref 0–44)
AST: 58 U/L — ABNORMAL HIGH (ref 15–41)
AST: 51 U/L — ABNORMAL HIGH (ref 15–41)
Albumin: 3.9 g/dL (ref 3.5–5.0)
Albumin: 3.8 g/dL (ref 3.5–5.0)
Alkaline Phosphatase: 78 U/L (ref 38–126)
Alkaline Phosphatase: 80 U/L (ref 38–126)
Anion gap: 14 (ref 5–15)
Anion gap: 11 (ref 5–15)
BUN: 8 mg/dL (ref 8–23)
BUN: 10 mg/dL (ref 8–23)
CO2: 28 mmol/L (ref 22–32)
CO2: 27 mmol/L (ref 22–32)
Calcium: 10 mg/dL (ref 8.9–10.3)
Calcium: 10.2 mg/dL (ref 8.9–10.3)
Chloride: 99 mmol/L (ref 98–111)
Chloride: 101 mmol/L (ref 98–111)
Creatinine, Ser: 0.84 mg/dL (ref 0.61–1.24)
Creatinine, Ser: 0.88 mg/dL (ref 0.61–1.24)
GFR, Estimated: >60 mL/min (ref >60)
GFR, Estimated: >60 mL/min (ref >60)
Glucose, Bld: 182 mg/dL — ABNORMAL HIGH (ref 70–99)
Glucose, Bld: 146 mg/dL — ABNORMAL HIGH (ref 70–99)
Potassium: 4.5 mmol/L (ref 3.5–5.1)
Potassium: 4.6 mmol/L (ref 3.5–5.1)
Sodium: 141 mmol/L (ref 135–145)
Sodium: 139 mmol/L (ref 135–145)
Total Bilirubin: 1 mg/dL (ref 0.3–1.2)
Total Bilirubin: 0.9 mg/dL (ref 0.3–1.2)
Total Protein: 7.7 g/dL (ref 6.5–8.1)
Total Protein: 7.4 g/dL (ref 6.5–8.1)

## 2020-09-29 LAB — LACTIC ACID, PLASMA: Lactic Acid, Venous: 2 mmol/L (ref 0.5–1.9)

## 2020-09-29 LAB — I-STAT ARTERIAL BLOOD GAS, ED
Acid-Base Excess: 6 mmol/L — ABNORMAL HIGH (ref 0.0–2.0)
Bicarbonate: 30.7 mmol/L — ABNORMAL HIGH (ref 20.0–28.0)
Calcium, Ion: 1.25 mmol/L (ref 1.15–1.40)
HCT: 37 % — ABNORMAL LOW (ref 39.0–52.0)
Hemoglobin: 12.6 g/dL — ABNORMAL LOW (ref 13.0–17.0)
O2 Saturation: 94 %
Patient temperature: 99.1
Potassium: 4.4 mmol/L (ref 3.5–5.1)
Sodium: 140 mmol/L (ref 135–145)
TCO2: 32 mmol/L (ref 22–32)
pCO2 arterial: 45.3 mmHg (ref 32.0–48.0)
pH, Arterial: 7.44 (ref 7.350–7.450)
pO2, Arterial: 70 mmHg — ABNORMAL LOW (ref 83.0–108.0)

## 2020-09-29 LAB — HIV ANTIBODY (ROUTINE TESTING W REFLEX): HIV Screen 4th Generation wRfx: NONREACTIVE

## 2020-09-29 LAB — ETHANOL: Alcohol, Ethyl (B): <10 mg/dL (ref <10)

## 2020-09-29 LAB — TROPONIN I (HIGH SENSITIVITY): Troponin I (High Sensitivity): 19 ng/L — ABNORMAL HIGH (ref <18)

## 2020-09-29 MED ORDER — UMECLIDINIUM-VILANTEROL 62.5-25 MCG/INH IN AEPB
1.0000 | INHALATION_SPRAY | Freq: Every day | RESPIRATORY_TRACT | Status: DC
  Administered 2020-09-29 – 2020-10-01 (×3): 1 via RESPIRATORY_TRACT
  Filled 2020-09-29: qty 14

## 2020-09-29 MED ORDER — VITAMIN B-12 1000 MCG PO TABS
1000.0000 ug | ORAL_TABLET | Freq: Every day | ORAL | Status: DC
  Administered 2020-09-29 – 2020-10-01 (×3): 1000 ug via ORAL
  Filled 2020-09-29 (×3): qty 1

## 2020-09-29 MED ORDER — NICOTINE 14 MG/24HR TD PT24
14.0000 mg | MEDICATED_PATCH | Freq: Every day | TRANSDERMAL | Status: DC
  Administered 2020-09-29 – 2020-10-01 (×3): 14 mg via TRANSDERMAL
  Filled 2020-09-29 (×3): qty 1

## 2020-09-29 MED ORDER — ALBUTEROL SULFATE HFA 108 (90 BASE) MCG/ACT IN AERS: 2.0000 | INHALATION_SPRAY | RESPIRATORY_TRACT | Status: DC | PRN

## 2020-09-29 MED ORDER — FOLIC ACID 1 MG PO TABS
1.0000 mg | ORAL_TABLET | Freq: Every day | ORAL | Status: DC
  Administered 2020-09-29 – 2020-10-01 (×3): 1 mg via ORAL
  Filled 2020-09-29 (×3): qty 1

## 2020-09-29 MED ORDER — ALBUTEROL SULFATE HFA 108 (90 BASE) MCG/ACT IN AERS
2.0000 | INHALATION_SPRAY | Freq: Four times a day (QID) | RESPIRATORY_TRACT | Status: DC
  Administered 2020-09-29 – 2020-09-30 (×3): 2 via RESPIRATORY_TRACT
  Filled 2020-09-29 (×2): qty 6.7

## 2020-09-29 MED ORDER — PREDNISONE 20 MG PO TABS: 40.0000 mg | ORAL_TABLET | Freq: Every day | ORAL | Status: DC

## 2020-09-29 MED ORDER — ENOXAPARIN SODIUM 40 MG/0.4ML ~~LOC~~ SOLN
40.0000 mg | Freq: Every day | SUBCUTANEOUS | Status: DC
  Filled 2020-09-29 (×3): qty 0.4

## 2020-09-29 MED ORDER — ENSURE ENLIVE PO LIQD
237.0000 mL | Freq: Three times a day (TID) | ORAL | Status: DC
  Administered 2020-09-30 – 2020-10-01 (×5): 237 mL via ORAL
  Filled 2020-09-29 (×2): qty 237

## 2020-09-29 MED ORDER — FLUTICASONE PROPIONATE 50 MCG/ACT NA SUSP: 1.0000 | Freq: Every day | NASAL | Status: DC | PRN

## 2020-09-29 MED ORDER — ALBUTEROL SULFATE (2.5 MG/3ML) 0.083% IN NEBU: 2.5000 mg | INHALATION_SOLUTION | RESPIRATORY_TRACT | Status: DC | PRN

## 2020-09-29 MED ORDER — UMECLIDINIUM-VILANTEROL 62.5-25 MCG/INH IN AEPB: 1.0000 | INHALATION_SPRAY | Freq: Every day | RESPIRATORY_TRACT | Status: DC

## 2020-09-29 MED ORDER — SODIUM CHLORIDE 0.9 % IV SOLN
2.0000 g | Freq: Three times a day (TID) | INTRAVENOUS | Status: DC
  Administered 2020-09-29 – 2020-10-01 (×7): 2 g via INTRAVENOUS
  Filled 2020-09-29 (×9): qty 2

## 2020-09-29 MED ORDER — PANTOPRAZOLE SODIUM 40 MG PO TBEC
40.0000 mg | DELAYED_RELEASE_TABLET | Freq: Every day | ORAL | Status: DC
  Administered 2020-09-29: 40 mg via ORAL
  Filled 2020-09-29: qty 1

## 2020-09-29 MED ORDER — IPRATROPIUM-ALBUTEROL 0.5-2.5 (3) MG/3ML IN SOLN: 3.0000 mL | Freq: Four times a day (QID) | RESPIRATORY_TRACT | Status: DC | PRN

## 2020-09-29 MED ORDER — ROSUVASTATIN CALCIUM 20 MG PO TABS
40.0000 mg | ORAL_TABLET | Freq: Every day | ORAL | Status: DC
  Administered 2020-09-29 – 2020-10-01 (×3): 40 mg via ORAL
  Filled 2020-09-29 (×3): qty 2

## 2020-09-29 MED ORDER — ASPIRIN EC 81 MG PO TBEC
81.0000 mg | DELAYED_RELEASE_TABLET | Freq: Every day | ORAL | Status: DC
  Administered 2020-09-29: 81 mg via ORAL
  Filled 2020-09-29 (×3): qty 1

## 2020-09-29 MED ORDER — FAMOTIDINE 20 MG PO TABS
20.0000 mg | ORAL_TABLET | Freq: Two times a day (BID) | ORAL | Status: DC
  Administered 2020-09-29 – 2020-10-01 (×5): 20 mg via ORAL
  Filled 2020-09-29 (×5): qty 1

## 2020-09-29 MED ORDER — BISACODYL 5 MG PO TBEC: 15.0000 mg | DELAYED_RELEASE_TABLET | Freq: Every day | ORAL | Status: DC | PRN

## 2020-09-29 MED ORDER — PREDNISONE 20 MG PO TABS
40.0000 mg | ORAL_TABLET | Freq: Every day | ORAL | Status: DC
  Administered 2020-09-29 – 2020-10-01 (×3): 40 mg via ORAL
  Filled 2020-09-29 (×3): qty 2

## 2020-09-29 MED ORDER — ENOXAPARIN SODIUM 40 MG/0.4ML ~~LOC~~ SOLN: 40.0000 mg | SUBCUTANEOUS | Status: DC

## 2020-09-29 NOTE — Progress Notes (Signed)
Interim progress note  S: Patient reports feeling a lot better.  States that his wife was in earlier to see him.  He is wondering if he can have something for his anxiety as he feels that his anxiety is what triggers his COPD.  Denies any anxiety at this time.  O: Blood pressure 135/88, pulse 96, temperature 98.8 F (37.1 C), temperature source Axillary, resp. rate (!) 21, height 5\' 11"  (1.803 m), weight 59 kg, SpO2 95 %. Gen: chronically ill appearing, poor dentition, conversational and in no acute distress Resp: Decreased air movements diffusely, more noticeable in right lung compared to left.  No wheezing.  No respiratory distress.  Able to speak in full sentences.  On 3 L nasal cannula. Cardio: RRR without murmur  A/P: Same plan as previously stated in progress note.  He now has scheduled albuterol every 6 hours with PRN Albuterol q4h. he is continued on cefepime and prednisone.  If he worsens tonight, plan for repeat CXR, ABG and consider placement on BiPAP though I suspect that he will be stable overnight.

## 2020-09-29 NOTE — Progress Notes (Addendum)
Family Medicine Teaching Service Daily Progress Note Intern Pager: [phone]  Patient name: [name] Medical record number: [id number] Date of birth: Dec 30, 1950 Age: 69 y.o. Gender: male  Primary Care Provider: [name], DO Consultants: None Code Status: FULL  Pt Overview and Major Events to Date:  Admitted: 12/6  Assessment and Plan: Lucas [name] is a 69 y.o. male who presented with shortness of breath and chest tightness found to have acute hypercapnic respiratory failure 2/2 COPD exacerbation. PMH is significant for COPD, h/o respiratory failure, malignant neoplasm of lung RUL, CAD, h/o NSTEMI, s/p CABG x4, tobacco use, alcohol abuse and dependence, liver cirrhosis, h/o Mallory-Weiss tear, severe protein-calorie malnutrition, peripheral neuropathy, gastritis and gastroduodenitis.  Acute hypercapnic respiratory failure secondary to COPD exacerbation: stable, improved COPD Squamous Cell Carcinoma, RUL, s/p SBRT Multiple admissions for exacerbations, most recently 09/06/20 where pt required intubation in ICU. He is off BiPAP and on 3L O2 via [place], which is what he uses at home PRN. Feels improved.  - Continuous pulse ox, saturation goal of 88-92% - Continue home meds of albuterol inhaler, Anoro Ellipta daily - D/c DuoNeb  -continue cefepime 2 g q8 hr x 5 total days - Continue Anoro Ellipta daily - 40 mg prednisone x 5 days  - Albuterol q6h  - Albuterol q4h PRN wheezing -Consider palliative consult - Consider re-imaging of lungs,repeat ABG and BiPAP if worsens - PT eval and treat  CAD  h/o NSTEMI  s/p CABG x4 (12/16/19) Home medications significant for 81 mg ASA, Nitroglycerin PRN, rosuvastatin 40 mg daily. -Continue ASA and rosuvastatin  Alcohol abuse and dependence  liver cirrhosis  h/o Mallory-Weiss tear Reports no history of prior withdrawal seizures -CIWA q4h  - Continue Vitamin B-12, thiamine and folic acid supplementation  Severe protein-calorie malnutrition   peripheral neuropathy  - Regular diet with protein supplementation - F/u dietician recommendations  Gastritis and gastroduodenitis -Continue home Pepcid 20 mg BID  Tobacco Abuse - Continue nicotine replacement patch  FEN/GI: Heart healthy/carb modified PPx: Lovenox   Status is: Observation  The patient remains OBS appropriate and will d/c before 2 midnights.  Dispo: The patient is from: Home              Anticipated d/c is to: Home              Anticipated d/c date is: 1 day              Patient currently is not medically stable to d/c.   Subjective:  Patient reports feeling improved this morning.  States that he has often hospitalizations for the same issue.  States that when he has trouble breathing he often begins to have increased anxiety.  Does not feel like he has any anxiety at the moment.  Notes some abdominal pain, "feels like it's sinking in", and attributes this to his heart surgery about a year ago.  Also states that when he has trouble breathing his belly tends to hurt. He usually uses 3 L of oxygen at home only when needed, occasionally during sleep and with exertion.  He smokes about 1/2 pack of cigarettes daily.  He drinks about a sixpack of beer daily.  Denies history of withdrawal or seizures from alcohol.  His last drink was yesterday. He is hungry and would like to eat, hasn't eaten anything since yesterday.  Objective: Temp:  [98.3 F (36.8 C)] 98.3 F (36.8 C) (12/06 2115) Pulse Rate:  [81-111] 91 (12/07 0530) Resp:  [11-31]  19 (12/07 0530) BP: (129-162)/(64-111) 132/80 (12/07 0530) SpO2:  [55 %-100 %] 55 % (12/07 0530) FiO2 (%):  [40 %] 40 % (12/07 0135) Weight:  [59.5 kg] 59.5 kg (12/06 2115) Physical Exam: General: Chronically ill-appearing, midline scar on chest, dry mucous membranes, poor dentition, horizontal nystagmus b/l Cardiovascular: RRR, no murmur Respiratory: No wheezing but poor air movement diffusely Abdomen: soft, normoactive bowel sounds,  mild tenderness diffusely  Extremities: No edema, clubbing in fingernails and toenails, 2+ DP and radial pulses  Laboratory: Recent Labs  Lab 09/28/20 2121 09/28/20 2152 09/29/20 0551  WBC 6.1  --   --   HGB 14.7 15.3 12.6*  HCT 43.9 45.0 37.0*  PLT 211  --   --    Recent Labs  Lab 09/28/20 2121 09/28/20 2152 09/29/20 0551  NA 143 141 140  K 4.5 4.3 4.4  CL 100  --   --   CO2 33*  --   --   BUN 9  --   --   CREATININE 0.89  --   --   CALCIUM 9.8  --   --   PROT 8.3*  --   --   BILITOT 0.8  --   --   ALKPHOS 92  --   --   ALT 45*  --   --   AST 74*  --   --   GLUCOSE 96  --   --     Imaging/Diagnostic Tests: None new.   Sharion Settler, DO 09/29/2020, 6:25 AM PGY-1, [place] Intern pager: [phone], text pages welcome

## 2020-09-29 NOTE — ED Notes (Signed)
Patient wife updated.

## 2020-09-29 NOTE — ED Notes (Signed)
SWOT nurse to transport pt to 2W12

## 2020-09-29 NOTE — ED Notes (Signed)
RN notified about vitals 

## 2020-09-29 NOTE — Hospital Course (Addendum)
CARDER YIN is a 69 y.o. male who presented with shortness of breath and chest tightness found to have acute hypercapnic respiratory failure 2/2 COPD exacerbation. PMH is significant for COPD, h/o respiratory failure, malignant neoplasm of lung RUL, CAD, h/o NSTEMI, s/p CABG x4, tobacco use, alcohol abuse and dependence, liver cirrhosis, h/o Mallory-Weiss tear, severe protein-calorie malnutrition, peripheral neuropathy, gastritis and gastroduodenitis. Below is his hospital course listed by problem.   Acute hypercapnic respiratory failure secondary to COPD exacerbation  Hx Squamous Cell Carcinoma RUL s/p SBRT Patient presented via EMS complaining of shortness of breath and chest tightness unresponsive to his home treatments.  Started on BiPAP and continuous albuterol nebulizer with improvement.  Covid and flu A/B-.  Blood cultures demonstrated no growth in 3 days. CXR consistent with emphysematous changes.  Patient was started on cefepime due to concern of pneumonia and additionally due to recent hospitalization last month where he required 1 day of intubation. Switched to cefdinir on 12/9 to finish course at home for further 5 days. Patient continued on scheduled Anoro Ellipta and albuterol every 6 hours,  40 mg prednisone x5 days.  No PT/OT follow up was recommended.   Other chronic problems stable and patient was continued on his home medication.

## 2020-09-29 NOTE — ED Notes (Signed)
Lunch tray ordered 

## 2020-09-30 LAB — CBC
HCT: 35.9 % — ABNORMAL LOW (ref 39.0–52.0)
Hemoglobin: 11.9 g/dL — ABNORMAL LOW (ref 13.0–17.0)
MCH: 31.8 pg (ref 26.0–34.0)
MCHC: 33.1 g/dL (ref 30.0–36.0)
MCV: 96 fL (ref 80.0–100.0)
Platelets: 155 10*3/uL (ref 150–400)
RBC: 3.74 MIL/uL — ABNORMAL LOW (ref 4.22–5.81)
RDW: 13.1 % (ref 11.5–15.5)
WBC: 4.4 10*3/uL (ref 4.0–10.5)
nRBC: 0 % (ref 0.0–0.2)

## 2020-09-30 MED ORDER — ADULT MULTIVITAMIN W/MINERALS CH
1.0000 | ORAL_TABLET | Freq: Every day | ORAL | Status: DC
  Administered 2020-09-30 – 2020-10-01 (×2): 1 via ORAL
  Filled 2020-09-30 (×2): qty 1

## 2020-09-30 MED ORDER — METOPROLOL TARTRATE 25 MG PO TABS
25.0000 mg | ORAL_TABLET | Freq: Two times a day (BID) | ORAL | Status: DC
  Administered 2020-09-30 – 2020-10-01 (×3): 25 mg via ORAL
  Filled 2020-09-30 (×3): qty 1

## 2020-09-30 MED ORDER — ALBUTEROL SULFATE HFA 108 (90 BASE) MCG/ACT IN AERS: 2.0000 | INHALATION_SPRAY | Freq: Two times a day (BID) | RESPIRATORY_TRACT | Status: DC

## 2020-09-30 NOTE — Progress Notes (Addendum)
Initial Nutrition Assessment  DOCUMENTATION CODES:   Severe malnutrition in context of chronic illness, Underweight  INTERVENTION:   Liberalize diet to REGULAR   Ensure Enlive po TID, each supplement provides 350 kcal and 20 grams of protein  MVI daily   NUTRITION DIAGNOSIS:   Severe Malnutrition related to chronic illness (COPD/lung neoplasm) as evidenced by severe fat depletion, severe muscle depletion, energy intake < or equal to 75% for > or equal to 1 month.  GOAL:   Patient will meet greater than or equal to 90% of their needs  MONITOR:   Supplement acceptance, PO intake, Labs, Weight trends, I & O's  REASON FOR ASSESSMENT:   Consult Assessment of nutrition requirement/status  ASSESSMENT:   Patient with PMH significant for COPD, malignant neoplasm of R lung, NSTEMI s/p CABG x4, ETOH use, liver cirrhosis, Mallory Weiss tear, peripheral neuropathy, and gastritis/gastroduodenitis. Presents this admission with COPD exacerbation.   Pt denies loss in appetite PTA. Typically consumes one meal with snacks daily that consist of pack of cheese with cracks and dinner- meat with vegetables. Drinks Glucerna inconsistently at home. Meal completions charted as 100% for last two meals. Discussed the importance of protein intake for preservation of lean body mass. Will use Ensure vs Glucerna as it provides more kcal and protein. Pt amenable.   Pt endorses a UBW of 70 kg and denies recent weight loss. Records indicate pt weighed 66.2 kg on 4/9 and 59 kg this admission (10.8% wt loss in 8 months, insignificant for time frame).   Medications: folic acid, prednisone, vitamin B12 Labs: CBG 96-182  NUTRITION - FOCUSED PHYSICAL EXAM:    Most Recent Value  Orbital Region Moderate depletion  Upper Arm Region Severe depletion  Thoracic and Lumbar Region Severe depletion  Buccal Region Severe depletion  Temple Region Moderate depletion  Clavicle Bone Region Severe depletion  Clavicle  and Acromion Bone Region Severe depletion  Scapular Bone Region Severe depletion  Dorsal Hand Moderate depletion  Patellar Region Severe depletion  Anterior Thigh Region Severe depletion  Posterior Calf Region Severe depletion  Edema (RD Assessment) None  Hair Reviewed  Eyes Reviewed  Mouth Reviewed  Skin Reviewed  Nails Reviewed     Diet Order:   Diet Order            Diet Carb Modified Fluid consistency: Thin; Room service appropriate? Yes  Diet effective now                 EDUCATION NEEDS:   Education needs have been addressed  Skin:  Skin Assessment: Reviewed RN Assessment  Last BM:  PTA  Height:   Ht Readings from Last 1 Encounters:  09/29/20 5\' 11"  (1.803 m)    Weight:   Wt Readings from Last 1 Encounters:  09/29/20 59 kg    BMI:  Body mass index is 18.13 kg/m.  Estimated Nutritional Needs:   Kcal:  1800-2000 kcal  Protein:  90-105 grams  Fluid:  >/= 1.8 L/day   Mariana Single RD, LDN Clinical Nutrition Pager listed in [place]

## 2020-09-30 NOTE — Discharge Summary (Addendum)
[facility] Discharge Summary  Patient name: Lucas [name] Medical record number: [id number] Date of birth: 1950/10/28 Age: 69 y.o. Gender: male Date of Admission: 09/28/2020  Date of Discharge: 10/01/20 Admitting Physician: Ezequiel Essex, MD  Primary Care Provider: [name], DO Consultants: None  Indication for Hospitalization: Acute hypercapnic respiratory failure secondary to COPD exacerbation  Discharge Diagnoses/Problem List:  Acute hypercapnic respiratory failure secondary to COPD exacerbation Squamous cell carcinoma RUL status post SBRT CAD Tobacco abuse Alcohol abuse Severe protein calorie malnutrition   Disposition: Home  Discharge Condition: Stable  Discharge Exam:  Blood pressure 139/89, pulse 73, temperature 98.1 F (36.7 C), resp. rate 20, height 5\' 11"  (1.803 m), weight 59 kg, SpO2 99 %.  Gen: chronically ill-appearing, poor dentition, pleasant, in no acute distress Resp: Diminished breath sounds diffusely, no wheezing, rhonchi or rales. No respiratory distress. Speaking in full sentences. No accessory muscle use Cardio: RRR, without murmur Ext: without edema, warm and dry   Brief Hospital Course:  Lucas [name] is a 69 y.o. male who presented with shortness of breath and chest tightness found to have acute hypercapnic respiratory failure 2/2 COPD exacerbation. PMH is significant for COPD, h/o respiratory failure, malignant neoplasm of lung RUL, CAD, h/o NSTEMI, s/p CABG x4, tobacco use, alcohol abuse and dependence, liver cirrhosis, h/o Mallory-Weiss tear, severe protein-calorie malnutrition, peripheral neuropathy, gastritis and gastroduodenitis. Below is his hospital course listed by problem.   Acute hypercapnic respiratory failure secondary to COPD exacerbation  Hx Squamous Cell Carcinoma RUL s/p SBRT Patient presented via EMS complaining of shortness of breath and chest tightness unresponsive to his home treatments.  Started  on BiPAP and continuous albuterol nebulizer with improvement.  Covid and flu A/B-.  Blood cultures demonstrated no growth in 3 days. CXR consistent with emphysematous changes.  Patient was started on cefepime due to concern of pneumonia and additionally due to recent hospitalization last month where he required 1 day of intubation. Switched to cefdinir on 12/9 to finish course at home for further 5 days. Patient continued on scheduled Anoro Ellipta and albuterol every 6 hours,  40 mg prednisone x5 days.  No PT/OT follow up was recommended.   Other chronic problems stable and patient was continued on his home medication.    Issues for Follow Up:  1. Consider rechecking CBC to assess leukocytosis (WBC 2.2) 2. Consider Palliative consult due to history of SCC with continued smoking, cachectic with severe malnutrition, severe COPD 3. TOC to aid with respiratory therapy and oxygen supply  Significant Procedures: None  Significant Labs and Imaging:  Recent Labs  Lab 09/28/20 2121 09/28/20 2152 09/29/20 0551 09/29/20 0745 09/30/20 0411  WBC 6.1  --   --  2.2* 4.4  HGB 14.7   < > 12.6* 13.2 11.9*  HCT 43.9   < > 37.0* 38.3* 35.9*  PLT 211  --   --  189 155   < > = values in this interval not displayed.   Recent Labs  Lab 09/28/20 2121 09/28/20 2152 09/29/20 0551 09/29/20 0745 09/29/20 1835  NA 143 141 140 141 139  K 4.5 4.3 4.4 4.5 4.6  CL 100  --   --  99 101  CO2 33*  --   --  28 27  GLUCOSE 96  --   --  182* 146*  BUN 9  --   --  8 10  CREATININE 0.89  --   --  0.84 0.88  CALCIUM 9.8  --   --  10.0 10.2  MG 2.8*  --   --   --   --   ALKPHOS 92  --   --  78 80  AST 74*  --   --  58* 51*  ALT 45*  --   --  40 37  ALBUMIN 4.4  --   --  3.9 3.8   Chest X-ray 12/6 IMPRESSION: No acute cardiopulmonary abnormality.  Results/Tests Pending at Time of Discharge: Final blood cultures  Discharge Medications:  Allergies as of 10/01/2020      Reactions   Ace Inhibitors Swelling    Ampicillin Nausea And Vomiting   Penicillins Nausea And Vomiting   Did it involve swelling of the face/tongue/throat, SOB, or low BP? N Did it involve sudden or severe rash/hives, skin peeling, or any reaction on the inside of your mouth or nose? N Did you need to seek medical attention at a hospital or doctor's office? N When did it last happen?Teenager If all above answers are "NO", may proceed with cephalosporin use.   Codeine Nausea And Vomiting   Lisinopril Swelling   REACTION: angioedema      Medication List    TAKE these medications   albuterol 108 (90 Base) MCG/ACT inhaler Commonly known as: VENTOLIN HFA INHALE 2 PUFFS BY MOUTH EVERY 4 HOURS AS NEEDED FOR WHEEZE OR FOR SHORTNESS OF BREATH What changed: See the new instructions.   Anoro Ellipta 62.5-25 MCG/INH Aepb Generic drug: umeclidinium-vilanterol TAKE 1 PUFF BY MOUTH EVERY DAY What changed: See the new instructions.   aspirin EC 81 MG tablet Take 1 tablet (81 mg total) by mouth daily.   bisacodyl 5 MG EC tablet Generic drug: bisacodyl Take 15 mg by mouth daily as needed for moderate constipation.   cefdinir 300 MG capsule Commonly known as: OMNICEF Take 1 capsule (300 mg total) by mouth every 12 (twelve) hours for 5 doses.   cyanocobalamin 1000 MCG tablet Take 1 tablet (1,000 mcg total) by mouth daily. Start taking on: October 02, 2020   famotidine 20 MG tablet Commonly known as: PEPCID Take 1 tablet (20 mg total) by mouth 2 (two) times daily.   feeding supplement Liqd Take 237 mLs by mouth 3 (three) times daily between meals.   fluticasone 50 MCG/ACT nasal spray Commonly known as: FLONASE SPRAY 2 SPRAYS INTO EACH NOSTRIL EVERY DAY What changed: See the new instructions.   folic acid 1 MG tablet Commonly known as: FOLVITE Take 1 tablet (1 mg total) by mouth daily.   ipratropium-albuterol 0.5-2.5 (3) MG/3ML Soln Commonly known as: DUONEB Take 3 mLs by nebulization every 6 (six) hours as  needed. What changed: reasons to take this   metoprolol tartrate 25 MG tablet Commonly known as: LOPRESSOR Take 1 tablet (25 mg total) by mouth 2 (two) times daily.   nitroGLYCERIN 0.4 MG SL tablet Commonly known as: NITROSTAT PLACE 1 TABLET (0.4 MG TOTAL) UNDER THE TONGUE EVERY 5 (FIVE) MINUTES AS NEEDED FOR CHEST PAIN.   OXYGEN Inhale 3-4 L into the lungs See admin instructions. Continuous at night, as needed during the day   oxymetazoline 0.05 % nasal spray Commonly known as: AFRIN Place 1 spray into both nostrils 2 (two) times daily as needed for congestion (nose bleeds).   predniSONE 20 MG tablet Commonly known as: DELTASONE Take 2 tablets (40 mg total) by mouth daily with breakfast for 2 days. Start taking on: October 02, 2020   predniSONE 10 MG tablet Commonly known as: DELTASONE Take 3  tablets (30 mg total) by mouth daily for 5 days. Start taking on: October 04, 2020   predniSONE 10 MG tablet Commonly known as: DELTASONE Take 2 tablets (20 mg total) by mouth daily for 5 days. Start taking on: October 09, 2020   predniSONE 10 MG tablet Commonly known as: DELTASONE Take 1 tablet (10 mg total) by mouth daily for 5 days. Start taking on: October 14, 2020   rosuvastatin 40 MG tablet Commonly known as: Crestor Take 1 tablet (40 mg total) by mouth daily.   thiamine 100 MG tablet Take 1 tablet (100 mg total) by mouth daily.        Discharge Instructions: Please refer to Patient Instructions section of EMR for full details.  Patient was counseled important signs and symptoms that should prompt return to medical care, changes in medications, dietary instructions, activity restrictions, and follow up appointments.   Follow-Up Appointments:  Follow-up Information    [place]. Go on 10/07/2020.   Why: You have a follow up appointment at 2:20PM Contact information: [place]               Lattie Haw, MD 10/01/2020, 3:01 PM PGY-2, [place]

## 2020-09-30 NOTE — Evaluation (Signed)
Occupational Therapy Evaluation Patient Details Name: Lucas [name] MRN: [id number] DOB: [birth year] Today's Date: 09/30/2020    History of Present Illness Pt is 69 yo male who presented with shortness of breat and admitted with respiratory failure secondary to COPD exacerbation.  Pt with PMH including COPD, h/o respiratory failure, malignant neoplasm of lung RUL, CAD, h/o NSTEMI, s/p CABG x4, tobacco use, alcohol abuse and dependence, liver cirrhosis, h/o Mallory-Weiss tear, severe protein-calorie malnutrition, peripheral neuropathy, gastritis and gastroduodenitis.   Clinical Impression   Pt is at supervision level with ADLs and ADL mobility. Pt educated on importance/benefits or energy conservation and safety using home O2 and DME. No further acute OT services are indicated at this time    Follow Up Recommendations  No OT follow up    Equipment Recommendations  Other (comment)    Recommendations for Other Services       Precautions / Restrictions Precautions Precautions: None Restrictions Weight Bearing Restrictions: No      Mobility Bed Mobility Overal bed mobility: Independent                  Transfers Overall transfer level: Needs assistance Equipment used: None Transfers: Sit to/from Stand Sit to Stand: Supervision              Balance Overall balance assessment: Needs assistance   Sitting balance-Leahy Scale: Good     Standing balance support: Single extremity supported;No upper extremity supported Standing balance-Leahy Scale: Good                             ADL either performed or assessed with clinical judgement   ADL Overall ADL's : Needs assistance/impaired Eating/Feeding: Independent   Grooming: Supervision/safety   Upper Body Bathing: Supervision/ safety   Lower Body Bathing: Supervison/ safety   Upper Body Dressing : Supervision/safety   Lower Body Dressing: Supervision/safety   Toilet Transfer:  Supervision/safety   Toileting- Clothing Manipulation and Hygiene: Supervision/safety   Tub/ Shower Transfer: Supervision/safety;3 in 1;Grab bars;Ambulation   Functional mobility during ADLs: Supervision/safety General ADL Comments: educated pt on energy conservation for safety(use DME and O2)     Vision Patient Visual Report: No change from baseline       Perception     Praxis      Pertinent Vitals/Pain Pain Assessment: No/denies pain Pain Score: 0-No pain     Hand Dominance Right   Extremity/Trunk Assessment Upper Extremity Assessment Upper Extremity Assessment: Overall WFL for tasks assessed   Lower Extremity Assessment Lower Extremity Assessment: Defer to PT evaluation   Cervical / Trunk Assessment Cervical / Trunk Assessment: Normal   Communication Communication Communication: No difficulties   Cognition Arousal/Alertness: Awake/alert Behavior During Therapy: WFL for tasks assessed/performed Overall Cognitive Status: Within Functional Limits for tasks assessed Area of Impairment: Safety/judgement;Awareness                         Safety/Judgement: Decreased awareness of safety;Decreased awareness of deficits     General Comments: pt has safety deficits and doesn't believe he needs to use O2/DME for safety/energy conservation   General Comments       Exercises     Shoulder Instructions      Home Living Family/patient expects to be discharged to:: Private residence Living Arrangements: Spouse/significant other Available Help at Discharge: Family;Available 24 hours/day Type of Home: Apartment Home Access: Stairs to enter Entrance Stairs-Number of Steps: 3 Entrance  Stairs-Rails: Left Home Layout: One level     Bathroom Shower/Tub: Teacher, early years/pre: Handicapped height     Home Equipment: Environmental consultant - 2 wheels;Cane - single point;Shower seat;Crutches   Additional Comments: Has oxygen at home that he wears when he needs  it      Prior Functioning/Environment Level of Independence: Needs assistance  Gait / Transfers Assistance Needed: Ambulates without AD at home; Can do some short distance community ambulation but uses motorized cart at store and w/c at doctor ADL's / Homemaking Assistance Needed: Independent with ADLs; Wife does IADLs   Comments: doesn't drive; wife does all the cooking and cleaning; doesn't go out into the community because he can't get around        OT Problem List: Decreased activity tolerance;Decreased knowledge of use of DME or AE;Cardiopulmonary status limiting activity      OT Treatment/Interventions:      OT Goals(Current goals can be found in the care plan section) Acute Rehab OT Goals Patient Stated Goal: to go home  OT Frequency:     Barriers to D/C:            Co-evaluation              AM-PAC OT "6 Clicks" Daily Activity     Outcome Measure Help from another person eating meals?: None Help from another person taking care of personal grooming?: None Help from another person toileting, which includes using toliet, bedpan, or urinal?: None Help from another person bathing (including washing, rinsing, drying)?: None Help from another person to put on and taking off regular upper body clothing?: None Help from another person to put on and taking off regular lower body clothing?: None 6 Click Score: 24   End of Session Equipment Utilized During Treatment: Gait belt;Oxygen  Activity Tolerance: Patient tolerated treatment well Patient left: in bed;with call bell/phone within reach;with bed alarm set;with SCD's reapplied  OT Visit Diagnosis: Unsteadiness on feet (R26.81);Other abnormalities of gait and mobility (R26.89);Other symptoms and signs involving cognitive function                Time: 1411-1438 OT Time Calculation (min): 27 min Charges:  OT General Charges $OT Visit: 1 Visit OT Evaluation $OT Eval Low Complexity: 1 Low OT Treatments $Self  Care/Home Management : 8-22 mins    Britt Bottom 09/30/2020, 3:25 PM

## 2020-09-30 NOTE — Progress Notes (Addendum)
Family Medicine Teaching Service Daily Progress Note Intern Pager: [phone]  Patient name: Lucas [name] Medical record number: [id number] Date of birth: Feb 20, 1951 Age: 69 y.o. Gender: male  Primary Care Provider: [name], DO Consultants: None Code Status: Full  Pt Overview and Major Events to Date:  Admitted: 12/6  Assessment and Plan: Joaopedro Eschbach Fordis a 69 y.o.malewho presented with shortness of breath and chest tightness found to have acute hypercapnic respiratory failure 2/2 COPD exacerbation. PMH is significant forCOPD,h/orespiratory failure, malignant neoplasm of lung RUL,CAD,h/oNSTEMI, s/p CABG x4, tobacco use, alcohol abuse and dependence, liver cirrhosis, h/oMallory-Weiss tear, severe protein-calorie malnutrition, peripheral neuropathy, gastritis and gastroduodenitis.  Acute hypercapnic respiratory failure secondary to COPD exacerbation: Resolved COPD Squamous Cell Carcinoma, RUL, s/p SBRT On 3 L nasal cannula with oxygen saturations between 95 to 100% in the last 24 hours.  Patient did not require any albuterol PRN's overnight.  Unfortunately he did not get 3 doses of his scheduled albuterol overnight due to medication "not available ". - Continuous pulse ox, saturation goal of 88-92% - Continue home meds of albuterol inhaler, Anoro Ellipta daily -Continue cefepime 2 g q8 hr, on day 2/5 - Continue Anoro Ellipta daily - 40 mg prednisone, on day 2/5 - Steroid taper on discharge - Albuterol q6h  - Albuterol q4h PRN wheezing -Consider palliative consult - PT eval and treat  CAD h/oNSTEMI s/p CABG x4(12/16/19) Home medications significant for 81 mg ASA, Nitroglycerin PRN, rosuvastatin 40 mg daily.  Patient is currently a half pack per day smoker. -Continue ASA and rosuvastatin - Continue metoprolol  Leukopenia: Resolved Leukopenic yesterday with WBC of 2.2.  Improved this morning and in the lower range of normal at 4.4.  Could be due to nutritional  deficiencies (I.e B12, folate, copper). Less likely due to viral infections such as HIV, EBV or parasitic infections. Less likely heme malignancy due to resolution and no other large abnormalities. - Recheck CBC in AM - Consider outpatient work-up if persists  Alcohol abuse and dependenceliver cirrhosis  h/oMallory-Weiss tear Reports no history of prior withdrawal seizures.  CIWA scores 0. -CIWA q4h  - Continue Vitamin B-12, thiamine and folic acid supplementation  Severe protein-calorie malnutritionperipheral neuropathy  - Regular diet with protein supplementation - F/u dietician recommendations  Gastritis and gastroduodenitis: Chronic, stable -Continue home Pepcid 20 mg BID  Tobacco Abuse: chronic, stable Reports currently smoking 1/2 pack/day - Continue nicotine replacement patch  FEN/GI: Heart healthy/carb modified PPx: Will place on SCD d/t patient refusing Lovenox   Status is: Inpatient  Remains inpatient appropriate because:Inpatient level of care appropriate due to severity of illness   Dispo:  Patient From: Home  Planned Disposition: To be determined  Expected discharge date: 10/05/20  Medically stable for discharge: No    Subjective:  Patient is feeling improved this morning.  He declines any shortness of breath.  Was able to sleep well.  Is not having any chest pain.  Patient tells me that he has a walker and a cane at home that he does not use because he does not want to get "addicted" to it.  States that he is able to ambulate well but mostly has trouble with stairs.  Objective: Temp:  [97.6 F (36.4 C)-98.8 F (37.1 C)] 97.9 F (36.6 C) (12/08 0447) Pulse Rate:  [71-113] 71 (12/08 0447) Resp:  [11-26] 19 (12/08 0447) BP: (115-152)/(67-88) 126/76 (12/08 0447) SpO2:  [95 %-100 %] 99 % (12/08 0447) Weight:  [59 kg] 59 kg (12/07 0932) Physical  Exam: General: No acute distress, poor dentition, conversational, pleasant Cardiovascular: RRR, no  murmurs Respiratory: Decreased air movement throughout, no wheezing rhonchi or crackles heard Extremities: Without edema, 2+ radial and DP pulses b/l   Laboratory: Recent Labs  Lab 09/28/20 2121 09/28/20 2152 09/29/20 0551 09/29/20 0745 09/30/20 0411  WBC 6.1  --   --  2.2* 4.4  HGB 14.7   < > 12.6* 13.2 11.9*  HCT 43.9   < > 37.0* 38.3* 35.9*  PLT 211  --   --  189 155   < > = values in this interval not displayed.   Recent Labs  Lab 09/28/20 2121 09/28/20 2152 09/29/20 0551 09/29/20 0745 09/29/20 1835  NA 143   < > 140 141 139  K 4.5   < > 4.4 4.5 4.6  CL 100  --   --  99 101  CO2 33*  --   --  28 27  BUN 9  --   --  8 10  CREATININE 0.89  --   --  0.84 0.88  CALCIUM 9.8  --   --  10.0 10.2  PROT 8.3*  --   --  7.7 7.4  BILITOT 0.8  --   --  1.0 0.9  ALKPHOS 92  --   --  78 80  ALT 45*  --   --  40 37  AST 74*  --   --  58* 51*  GLUCOSE 96  --   --  182* 146*   < > = values in this interval not displayed.    Imaging/Diagnostic Tests: None new.  Sharion Settler, DO 09/30/2020, 6:08 AM PGY-1, [place] Intern pager: [phone], text pages welcome

## 2020-09-30 NOTE — Evaluation (Signed)
Physical Therapy Evaluation Patient Details Name: Lucas [name] MRN: [id number] DOB: 09/22/1951 Today's Date: 09/30/2020   History of Present Illness  Pt is 69 yo male who presented with shortness of breat and admitted with respiratory failure secondary to COPD exacerbation.  Pt with PMH including COPD, h/o respiratory failure, malignant neoplasm of lung RUL, CAD, h/o NSTEMI, s/p CABG x4, tobacco use, alcohol abuse and dependence, liver cirrhosis, h/o Mallory-Weiss tear, severe protein-calorie malnutrition, peripheral neuropathy, gastritis and gastroduodenitis.  Clinical Impression  Pt admitted with above diagnosis. Pt presenting with mild deficits in mobility and balance and endurance.  He had 2/4 DOE with activity and held IV pole for stability.  Pt is normally independent at home with wife.  Will progress to no AD as able, as pt prefers to not use AD. Pt currently with functional limitations due to the deficits listed below (see PT Problem List). Pt will benefit from skilled PT to increase their independence and safety with mobility to allow discharge to the venue listed below.       Follow Up Recommendations No PT follow up    Equipment Recommendations  None recommended by PT    Recommendations for Other Services       Precautions / Restrictions Precautions Precautions: None      Mobility  Bed Mobility Overal bed mobility: Independent                  Transfers Overall transfer level: Needs assistance Equipment used: None Transfers: Sit to/from Stand Sit to Stand: Supervision            Ambulation/Gait Ambulation/Gait assistance: Supervision;Min guard Gait Distance (Feet): 250 Feet Assistive device: IV Pole Gait Pattern/deviations: Step-through pattern Gait velocity: decreased   General Gait Details: Min guard progressed to supervision.  Pt held IV pole for stability  Stairs            Wheelchair Mobility    Modified Rankin (Stroke Patients  Only)       Balance Overall balance assessment: Needs assistance   Sitting balance-Leahy Scale: Normal     Standing balance support: Single extremity supported;No upper extremity supported Standing balance-Leahy Scale: Good Standing balance comment: IV pole when ambulating but took a few steps in room without AD                             Pertinent Vitals/Pain Pain Assessment: No/denies pain    Home Living Family/patient expects to be discharged to:: Private residence Living Arrangements: Spouse/significant other Available Help at Discharge: Family;Available 24 hours/day Type of Home: Apartment Home Access: Stairs to enter Entrance Stairs-Rails: Left Entrance Stairs-Number of Steps: 3 Home Layout: One level Home Equipment: Walker - 2 wheels;Cane - single point;Shower seat;Crutches Additional Comments: Has oxygen at home that he wears when he needs it    Prior Function Level of Independence: Needs assistance   Gait / Transfers Assistance Needed: Ambulates without AD at home; Can do some short distance community ambulation but uses motorized cart at store and w/c at doctor  ADL's / Homemaking Assistance Needed: Independent with ADLs; Wife does IADLs        Hand Dominance   Dominant Hand: Right    Extremity/Trunk Assessment   Upper Extremity Assessment Upper Extremity Assessment: Overall WFL for tasks assessed    Lower Extremity Assessment Lower Extremity Assessment: Overall WFL for tasks assessed    Cervical / Trunk Assessment Cervical / Trunk Assessment: Normal  Communication   Communication: No difficulties  Cognition Arousal/Alertness: Awake/alert Behavior During Therapy: WFL for tasks assessed/performed Overall Cognitive Status: Within Functional Limits for tasks assessed                                        General Comments General comments (skin integrity, edema, etc.): VSS; pt reports wears 3 L O2 with mobility at  home.  Sats were 98% on 3 LPM, decreased to 2 LPM and sats 96% but pt with increased DOE, back on 3 LPM post therapy    Exercises     Assessment/Plan    PT Assessment Patient needs continued PT services  PT Problem List Decreased strength;Cardiopulmonary status limiting activity;Decreased safety awareness;Decreased activity tolerance;Decreased mobility;Decreased balance       PT Treatment Interventions DME instruction;Stair training;Functional mobility training;Patient/family education;Therapeutic activities;Therapeutic exercise;Gait training;Balance training    PT Goals (Current goals can be found in the Care Plan section)  Acute Rehab PT Goals Patient Stated Goal: to go home PT Goal Formulation: With patient Time For Goal Achievement: 10/14/20 Potential to Achieve Goals: Good    Frequency Min 3X/week   Barriers to discharge        Co-evaluation               AM-PAC PT "6 Clicks" Mobility  Outcome Measure Help needed turning from your back to your side while in a flat bed without using bedrails?: None Help needed moving from lying on your back to sitting on the side of a flat bed without using bedrails?: None Help needed moving to and from a bed to a chair (including a wheelchair)?: A Little Help needed standing up from a chair using your arms (e.g., wheelchair or bedside chair)?: A Little Help needed to walk in hospital room?: A Little Help needed climbing 3-5 steps with a railing? : A Little 6 Click Score: 20    End of Session Equipment Utilized During Treatment: Oxygen Activity Tolerance: Patient tolerated treatment well Patient left: in bed;with call bell/phone within reach Nurse Communication: Mobility status PT Visit Diagnosis: Unsteadiness on feet (R26.81)    Time: 1130-1156 PT Time Calculation (min) (ACUTE ONLY): 26 min   Charges:   PT Evaluation $PT Eval Low Complexity: 1 Low PT Treatments $Gait Training: 8-22 mins        Abran Richard, PT Acute  Rehab Services Pager [phone] [facility] [place] 09/30/2020, 11:05 AM

## 2020-09-30 NOTE — Progress Notes (Signed)
OT Cancellation Note  Patient Details Name: Lucas [name] MRN: [id number] DOB: [birth year]   Cancelled Treatment:    Reason Eval/Treat Not Completed: Other (comment). Pt just began eating lunch. Plan to reattempt.   Tyrone Schimke, OT Acute Rehabilitation Services Pager: [phone] Office: [phone]  09/30/2020, 12:45 PM

## 2020-10-01 ENCOUNTER — Other Ambulatory Visit: Payer: Self-pay | Admitting: Family Medicine

## 2020-10-01 DIAGNOSIS — Z515 Encounter for palliative care: Secondary | ICD-10-CM

## 2020-10-01 DIAGNOSIS — Z7189 Other specified counseling: Secondary | ICD-10-CM

## 2020-10-01 DIAGNOSIS — J449 Chronic obstructive pulmonary disease, unspecified: Secondary | ICD-10-CM

## 2020-10-01 MED ORDER — PREDNISONE 10 MG PO TABS: 10.0000 mg | ORAL_TABLET | Freq: Every day | ORAL | 0 refills | Status: AC

## 2020-10-01 MED ORDER — PREDNISONE 10 MG PO TABS: 30.0000 mg | ORAL_TABLET | Freq: Every day | ORAL | 0 refills | Status: AC

## 2020-10-01 MED ORDER — CEFDINIR 300 MG PO CAPS
300.0000 mg | ORAL_CAPSULE | Freq: Two times a day (BID) | ORAL | Status: DC
  Filled 2020-10-01: qty 1

## 2020-10-01 MED ORDER — PREDNISONE 20 MG PO TABS: 40.0000 mg | ORAL_TABLET | Freq: Every day | ORAL | 0 refills | Status: AC

## 2020-10-01 MED ORDER — ASPIRIN EC 81 MG PO TBEC
81.0000 mg | DELAYED_RELEASE_TABLET | Freq: Every day | ORAL | 11 refills | Status: DC
Start: 2020-10-01 — End: 2020-12-28

## 2020-10-01 MED ORDER — PREDNISONE 10 MG PO TABS: 20.0000 mg | ORAL_TABLET | Freq: Every day | ORAL | 0 refills | Status: AC

## 2020-10-01 MED ORDER — CYANOCOBALAMIN 1000 MCG PO TABS
1000.0000 ug | ORAL_TABLET | Freq: Every day | ORAL | 1 refills | Status: DC
Start: 2020-10-02 — End: 2021-11-26

## 2020-10-01 MED ORDER — CEFDINIR 300 MG PO CAPS: 300.0000 mg | ORAL_CAPSULE | Freq: Two times a day (BID) | ORAL | 0 refills | Status: AC

## 2020-10-01 NOTE — Discharge Instructions (Signed)
You were hospitalized at [facility] due to a flare of your COPD.  You improved with breathing treatments, steroids. You were also given antibiotics to cover for any possible bacterial infection. You will need to continue the antibiotics until Saturday 12/11. Additionally, you will need to continue a steroid taper as described below. We are so glad you are feeling better!  Be sure to follow-up with your regularly scheduled appointments.  Please also be sure to follow-up with our clinic at your earliest convenience.  Thank you for allowing [place] to take care of you!  Take care, [facility] family medicine team  Prednisone Taper: Take 40 mg tablets until 12/11 Take 30 mg tablets from 12/12-12/16 Take 20 mg tablets from 12/17-12/21 Take 10 mg tablets from 12/22-12/26   Chronic Obstructive Pulmonary Disease Exacerbation Chronic obstructive pulmonary disease (COPD) is a long-term (chronic) lung problem. In COPD, the flow of air from the lungs is limited. COPD exacerbations are times that breathing gets worse and you need more than your normal treatment. Without treatment, they can be life threatening. If they happen often, your lungs can become more damaged. If your COPD gets worse, your doctor may treat you with:  Medicines.  Oxygen.  Different ways to clear your airway, such as using a mask. Follow these instructions at home: Medicines  Take over-the-counter and prescription medicines only as told by your doctor.  If you take an antibiotic or steroid medicine, do not stop taking the medicine even if you start to feel better.  Keep up with shots (vaccinations) as told by your doctor. Be sure to get a yearly (annual) flu shot. Lifestyle  Do not smoke. If you need help quitting, ask your doctor.  Eat healthy foods.  Exercise regularly.  Get plenty of sleep.  Avoid tobacco smoke and other things that can bother your lungs.  Wash your hands often with soap and water. This will help keep  you from getting an infection. If you cannot use soap and water, use hand sanitizer.  During flu season, avoid areas that are crowded with people. General instructions  Drink enough fluid to keep your pee (urine) clear or pale yellow. Do not do this if your doctor has told you not to.  Use a cool mist machine (vaporizer).  If you use oxygen or a machine that turns medicine into a mist (nebulizer), continue to use it as told.  Follow all instructions for rehabilitation. These are steps you can take to make your body work better.  Keep all follow-up visits as told by your doctor. This is important. Contact a doctor if:  Your COPD symptoms get worse than normal. Get help right away if:  You are short of breath and it gets worse.  You have trouble talking.  You have chest pain.  You cough up blood.  You have a fever.  You keep throwing up (vomiting).  You feel weak or you pass out (faint).  You feel confused.  You are not able to sleep because of your symptoms.  You are not able to do daily activities. Summary  COPD exacerbations are times that breathing gets worse and you need more treatment than normal.  COPD exacerbations can be very serious and may cause your lungs to become more damaged.  Do not smoke. If you need help quitting, ask your doctor.  Stay up-to-date on your shots. Get a flu shot every year. This information is not intended to replace advice given to you by your  health care provider. Make sure you discuss any questions you have with your health care provider. Document Revised: 09/22/2017 Document Reviewed: 11/14/2016 Elsevier Patient Education  2020 [employer].

## 2020-10-01 NOTE — Progress Notes (Signed)
Hydrologist [facility])  Hospital Liaison RN note         [facility] for [facility] Palliative services at home after discharge.         As patient is awaiting transport at this time, our [facility] Palliative Team will reach out to patient to confirm interest and explain services once patient is home.                Thank you for the opportunity to participate in this patient's care.     Gar Ponto, RN [facility] Liaison on [place])    [phone]

## 2020-10-01 NOTE — Consult Note (Signed)
Consultation Note Date: 10/01/2020   Patient Name: Lucas [name]  DOB: 01-09-1951  MRN: [id number]  Age / Sex: 69 y.o., male  PCP: [name], DO Referring Physician: McDiarmid, Blane Ohara, MD  Reason for Consultation: Establishing goals of care  HPI/Patient Profile: 69 y.o. male  with past medical history of COPD,respiratory failure, malignant neoplasm of lung RUL,CAD,NSTEMI s/p CABG x4, tobacco use, alcohol abuse and dependence, liver cirrhosis, Mallory-Weiss tear, severe protein-calorie malnutrition, peripheral neuropathy, gastritis and gastroduodenitis admitted on 09/28/2020 with shortness of breath.  He was admitted for acute hypercapnic respiratory failure secondary to COPD exacerbation. PMT consulted to discuss [place].  Clinical Assessment and Goals of Care: I have reviewed medical records including EPIC notes, labs and imaging, assessed the patient and then met with patient  to discuss diagnosis prognosis, GOC, EOL wishes, disposition and options.  I introduced Palliative Medicine as specialized medical care for people living with serious illness. It focuses on providing relief from the symptoms and stress of a serious illness. The goal is to improve quality of life for both the patient and the family.  He tells me he lives in an apartment with his wife.  As far as functional and nutritional status, he tells me he has good function - occasionally needs some assistance getting out of bath tub. Tells me he also has a good appetite. Today ate 100% of meals and consumed Ensures.    We discussed patient's current illness and what it means in the larger context of patient's on-going co-morbidities.  Natural disease trajectory and expectations at EOL were discussed. Discussed lung cancer. He follows up with oncology soon - right now every six months. Tells me he was diagnosed in Feb. We also discussed his COPD. He tells me about symptoms. We discuss  breathing techniques when he feels short of breath.   I attempted to elicit values and goals of care important to the patient.  The difference between aggressive medical intervention and comfort care was considered in light of the patient's goals of care. Advance directives, concepts specific to code status, artificial feeding and hydration, and rehospitalization were considered and discussed.   I completed a MOST form today. The patient and family outlined their wishes for the following treatment decisions:  Cardiopulmonary Resuscitation: Attempt Resuscitation (CPR)  Medical Interventions: Full Scope of Treatment: Use intubation, advanced airway interventions, mechanical ventilation, cardioversion as indicated, medical treatment, IV fluids, etc, also provide comfort measures. Transfer to the hospital if indicated  Antibiotics: Antibiotics if indicated  IV Fluids: IV fluids if indicated  Feeding Tube: No feeding tube   He does share that he would never want a trach.   Discussed with patient the importance of continued conversation with family and the medical providers regarding overall plan of care and treatment options, ensuring decisions are within the context of the patient's values and GOCs.    Palliative Care services outpatient were explained and offered. Patient agreeable to outpatient palliative.   Questions and concerns were addressed. The family was encouraged to call with questions or concerns.   Primary Decision Maker PATIENT    SUMMARY OF RECOMMENDATIONS   Full code/full scope - would never want trach MOST completed on chart Outpatient palliatve  Code Status/Advance Care Planning:  Full code  Prognosis:   Unable to determine  Discharge Planning: Home with Palliative Services      Primary Diagnoses: Present on Admission: . COPD (chronic obstructive pulmonary disease) ([place]) . Cirrhosis of liver ([place]) . Malignant neoplasm of  right upper lobe of lung ([place]) .  Acute on chronic respiratory failure with hypoxia ([place]) . CAD (coronary artery disease) . NSTEMI (non-ST elevated myocardial infarction) ([place]) . Tobacco abuse . Pulmonary nodule . Protein-calorie malnutrition, severe . Lung mass . Hyperlipidemia   I have reviewed the medical record, interviewed the patient and family, and examined the patient. The following aspects are pertinent.  Past Medical History:  Diagnosis Date  . Acute blood loss anemia 06/21/2018  . Acute upper GI bleed 06/21/2018  . Allergy   . Bilateral AVN of femurs ([place]) 06/25/2018   See 11/2017 Chest CT report  . COPD 12/21/2006  . Environmental allergies   . History of femur fracture 06/25/2018   11/2017 CT AP showed Remote nonunited right femur greater trochanter fracture  . History of Fracture of right tibia 06/26/2018  . History of multiple right thoacic rib fractures 06/25/2018  . History of Right clavicular fracture 06/25/2018  . HYPERLIPIDEMIA 12/21/2006  . HYPERTENSION, BENIGN SYSTEMIC 12/21/2006  . Mallory-Weiss tear   . TOBACCO DEPENDENCE 12/21/2006   Social History   Socioeconomic History  . Marital status: Married    Spouse name: Not on file  . Number of children: 2  . Years of education: Not on file  . Highest education level: Not on file  Occupational History  . Not on file  Tobacco Use  . Smoking status: Current Every Day Smoker    Packs/day: 0.50    Years: 51.00    Pack years: 25.50    Types: Cigarettes  . Smokeless tobacco: Former Systems developer    Types: Snuff, Chew    Quit date: 01/05/1965  . Tobacco comment: currently half a pack a day-05/26/2020  Vaping Use  . Vaping Use: Never used  Substance and Sexual Activity  . Alcohol use: Yes    Alcohol/week: 6.0 standard drinks    Types: 6 Cans of beer per week    Comment: drinks about a 6-pack/day  . Drug use: No  . Sexual activity: Yes    Partners: Female  Other Topics Concern  . Not on file  Social History Narrative  . Not on file   Social  Determinants of Health   Financial Resource Strain: Not on file  Food Insecurity: Not on file  Transportation Needs: Not on file  Physical Activity: Not on file  Stress: Not on file  Social Connections: Not on file   Family History  Problem Relation Age of Onset  . Emphysema Father    Scheduled Meds: . aspirin EC  81 mg Oral Daily  . cefdinir  300 mg Oral Q12H  . enoxaparin (LOVENOX) injection  40 mg Subcutaneous Daily  . famotidine  20 mg Oral BID  . feeding supplement  237 mL Oral TID BM  . folic acid  1 mg Oral Daily  . metoprolol tartrate  25 mg Oral BID  . multivitamin with minerals  1 tablet Oral Daily  . nicotine  14 mg Transdermal Daily  . predniSONE  40 mg Oral Q breakfast  . rosuvastatin  40 mg Oral Daily  . umeclidinium-vilanterol  1 puff Inhalation Daily  . vitamin B-12  1,000 mcg Oral Daily   Continuous Infusions: PRN Meds:.albuterol, bisacodyl, fluticasone Allergies  Allergen Reactions  . Ace Inhibitors Swelling  . Ampicillin Nausea And Vomiting  . Penicillins Nausea And Vomiting    Did it involve swelling of the face/tongue/throat, SOB, or low BP? N Did it involve sudden or severe rash/hives, skin peeling, or  any reaction on the inside of your mouth or nose? N Did you need to seek medical attention at a hospital or doctor's office? N When did it last happen?Teenager If all above answers are "NO", may proceed with cephalosporin use.  . Codeine Nausea And Vomiting  . Lisinopril Swelling    REACTION: angioedema   Review of Systems  All other systems reviewed and are negative.   Physical Exam Constitutional:      General: He is not in acute distress. Pulmonary:     Effort: Pulmonary effort is normal.  Musculoskeletal:     Right lower leg: No edema.     Left lower leg: No edema.  Skin:    General: Skin is warm and dry.  Neurological:     Mental Status: He is alert and oriented to person, place, and time.  Psychiatric:        Mood and  Affect: Mood normal.        Behavior: Behavior normal.     Vital Signs: BP 139/89 (BP Location: Right Arm)   Pulse 73   Temp 98.1 F (36.7 C)   Resp 20   Ht _0  (1.803 m)   Wt 59 kg   SpO2 99%   BMI 18.13 kg/m  Pain Scale: 0-10   Pain Score: 0-No pain   SpO2: SpO2: 99 % O2 Device:SpO2: 99 % O2 Flow Rate: .O2 Flow Rate (L/min): 1 L/min  IO: Intake/output summary:   Intake/Output Summary (Last 24 hours) at 10/01/2020 1437 Last data filed at 09/30/2020 1700 Gross per 24 hour  Intake 220 ml  Output 200 ml  Net 20 ml    LBM: Last BM Date:  (pta ) Baseline Weight: Weight: 59.5 kg Most recent weight: Weight: 59 kg     Palliative Assessment/Data:  PPS 60%    Time Total: 45 minutes Greater than 50%  of this time was spent counseling and coordinating care related to the above assessment and plan.  Juel Burrow, DNP, AGNP-C Palliative Medicine Team [phone] Pager: [phone]

## 2020-10-01 NOTE — Consult Note (Signed)
   [facility] Inpatient Consult   10/01/2020  JAESON MOLSTAD [birth year] [id number]  [facility] [[facility]] Patient:  Lucas [name]  Patient was screened for transition of care needs for post hospital follow up in an Warden/ranger which he was active for  chronic disease management Embedded Care Management team for COPD and transportation noted.  Plan: Notification sent to the [facility] Management and made aware of above needs.   Please contact for further questions,  Lucas [name], Lucas [name] [facility] Liaison  [phone] business mobile phone Toll free office [phone]  Fax number: [phone] [email or web address]

## 2020-10-03 LAB — CULTURE, BLOOD (ROUTINE X 2)
Culture: NO GROWTH
Culture: NO GROWTH
Special Requests: ADEQUATE

## 2020-10-05 ENCOUNTER — Other Ambulatory Visit: Payer: Self-pay | Admitting: Family Medicine

## 2020-10-05 ENCOUNTER — Ambulatory Visit: Payer: Medicare Other

## 2020-10-05 DIAGNOSIS — I208 Other forms of angina pectoris: Secondary | ICD-10-CM

## 2020-10-06 NOTE — Patient Instructions (Signed)
It was great to see you! Thank you for allowing me to participate in your care!  Our plans for today:  -Today we discussed your hospitalization and mentioned the possibility of doing a palliative consult.  Remember palliative is different from hospice.  If you ever decide you are interested in doing this we can provide a palliative consult at any time to discuss ways that may be of to help you. -If you develop any further symptoms or need [place] in any way do not hesitate to follow-up sooner but I would like for you to schedule a follow-up with your primary care doctor in the next 1 to 2 months.   Take care and seek immediate care sooner if you develop any concerns.   Dr. Lurline Del, [place]

## 2020-10-06 NOTE — Progress Notes (Addendum)
    SUBJECTIVE:   CHIEF COMPLAINT / HPI:   Hospital follow-up-COPD exacerbation: Patient is a 69 year old male who presents today for follow-up after being discharged from the hospital due to a COPD exacerbation. Per chart review patient was admitted on 09/28/2020 with acute hypoxic respiratory failure.  Blood cultures obtained during hospitalization showed no growth at time of discharge.  Patient was started on cefepime which was switched to cefdinir with plan for further 5-day course at the time of discharge.  Patient was also continued on albuterol as well as 40 mg prednisone for 5 days.   Per discharge summary recommended recheck CBC to assess for leukopenia as the patient had white blood cell count 2.2 during hospitalization, however on the drawl at the time of discharge the patient's white blood cell count had increased to the lower level of normal limits.  Discharge summary also recommended consider palliative consult for his history of squamous cell carcinoma with continued smoking and cachectic appearance with severe malnutrition severe COPD.  He states he is normally on 3L O2 prior to this hospitalization and is currently on 3L now.  He states he feels he is similar to how he felt prior to his hospitalization.  He denies any current complaints at this time and states he plans to follow-up with his cancer doctor next month.  PERTINENT  PMH / PSH: COPD  OBJECTIVE:   BP 138/68   Pulse 100   Ht 5\' 11"  (1.803 m)   Wt 135 lb 6.4 oz (61.4 kg)   SpO2 94%   BMI 18.88 kg/m    Pulse recheck 85  General: NAD, pleasant, able to participate in exam Cardiac: RRR, no murmurs. Respiratory: Faint breath sounds, fine crackles in the bases and only minor wheezes on expiration. Nasal cannula in place. Neuro: alert, no obvious focal deficits Psych: Normal affect and mood  ASSESSMENT/PLAN:   COPD (chronic obstructive pulmonary disease) (HCC) Assessment: 69 year old male presenting for hospital  follow-up after being admitted for COPD exacerbation.  Patient is back to his baseline oxygen requirement of 3 L nasal cannula.  His lung exam does show faint breath sounds with fine crackles in the bases and only minor wheezing on expiration.  He is in no respiratory distress during our encounter.  He does have a history of a lung malignancy and is planning to follow-up with his oncologist next month.  Discussed with him in detail the prospects of a palliative consult as he has multiple comorbidities.  I explained to the patient in detail that palliative is different from hospice and discussed ways that palliative can sometimes assist patients with multiple comorbidities.  Patient overall appears to be back at his baseline from a respiratory standpoint. Plan: -Discussed palliative consult as above, patient wishes to hold off on this at this time -Patient plans to make a follow-up appointment with his PCP in the next 1 to 2 months or sooner if needed -Patient plans to follow-up regarding his history of lung cancer as scheduled next month     Lurline Del, [place]    This note was prepared using Dragon voice recognition software and may include unintentional dictation errors due to the inherent limitations of voice recognition software.

## 2020-10-07 ENCOUNTER — Other Ambulatory Visit: Payer: Self-pay

## 2020-10-07 ENCOUNTER — Telehealth: Payer: Self-pay

## 2020-10-07 ENCOUNTER — Ambulatory Visit (INDEPENDENT_AMBULATORY_CARE_PROVIDER_SITE_OTHER): Payer: Medicare Other | Admitting: Family Medicine

## 2020-10-07 VITALS — BP 138/68 | HR 100 | Ht 71.0 in | Wt 135.4 lb

## 2020-10-07 DIAGNOSIS — D72829 Elevated white blood cell count, unspecified: Secondary | ICD-10-CM

## 2020-10-07 DIAGNOSIS — J441 Chronic obstructive pulmonary disease with (acute) exacerbation: Secondary | ICD-10-CM | POA: Diagnosis not present

## 2020-10-07 NOTE — Assessment & Plan Note (Signed)
Assessment: 69 year old male presenting for hospital follow-up after being admitted for COPD exacerbation.  Patient is back to his baseline oxygen requirement of 3 L nasal cannula.  His lung exam does show faint breath sounds with fine crackles in the bases and only minor wheezing on expiration.  He is in no respiratory distress during our encounter.  He does have a history of a lung malignancy and is planning to follow-up with his oncologist next month.  Discussed with him in detail the prospects of a palliative consult as he has multiple comorbidities.  I explained to the patient in detail that palliative is different from hospice and discussed ways that palliative can sometimes assist patients with multiple comorbidities.  Patient overall appears to be back at his baseline from a respiratory standpoint. Plan: -Discussed palliative consult as above, patient wishes to hold off on this at this time -Patient plans to make a follow-up appointment with his PCP in the next 1 to 2 months or sooner if needed -Patient plans to follow-up regarding his history of lung cancer as scheduled next month

## 2020-10-07 NOTE — Progress Notes (Signed)
   Covid-19 Vaccination Clinic  Name:  MYCAL CONDE    MRN: [id number] DOB: 1951-04-08  10/07/2020  Mr. Proudfoot was observed post Covid-19 immunization for 15 minutes without incident. He was provided with Vaccine Information Sheet and instruction to access the V-Safe system.   Mr. Dennin was instructed to call 911 with any severe reactions post vaccine: Marland Kitchen Difficulty breathing  . Swelling of face and throat  . A fast heartbeat  . A bad rash all over body  . Dizziness and weakness   Immunizations Administered    No immunizations on file.

## 2020-10-07 NOTE — Telephone Encounter (Signed)
Stacey from [employer] calling for verbal orders for initiation of palliative care services. Verbal orders given per [facility] protocol  Talbot Grumbling, RN

## 2020-10-07 NOTE — Telephone Encounter (Signed)
Spoke with patient's wife [name] she states patient had a doctors appointment. She requested that I call back to speak with patient regarding Palliative care consult appointment.

## 2020-10-08 ENCOUNTER — Telehealth: Payer: Self-pay

## 2020-10-08 NOTE — Telephone Encounter (Signed)
Spoke with patient and scheduled an in-person Palliative Consult for 11/13/20 @ 12:30PM   COVID screening was negative. No pets in home. Patient lives with wife.   Consent obtained; updated Outlook/Netsmart/Team List and Epic.

## 2020-10-25 DIAGNOSIS — J90 Pleural effusion, not elsewhere classified: Secondary | ICD-10-CM | POA: Diagnosis not present

## 2020-10-25 DIAGNOSIS — J449 Chronic obstructive pulmonary disease, unspecified: Secondary | ICD-10-CM | POA: Diagnosis not present

## 2020-10-25 DIAGNOSIS — I2510 Atherosclerotic heart disease of native coronary artery without angina pectoris: Secondary | ICD-10-CM | POA: Diagnosis not present

## 2020-10-26 ENCOUNTER — Encounter: Payer: Self-pay | Admitting: Radiation Oncology

## 2020-10-26 ENCOUNTER — Telehealth: Payer: Self-pay

## 2020-10-26 ENCOUNTER — Other Ambulatory Visit: Payer: Self-pay

## 2020-10-26 NOTE — Telephone Encounter (Signed)
Spoke with patient in regards to CT scan results visit with Shona Simpson PA on 11/02/20 @ 1:00pm. Patient verbalized understanding of appointment date and time. Reviewed meaningful use questions.TM

## 2020-10-27 ENCOUNTER — Other Ambulatory Visit: Payer: Self-pay | Admitting: Family Medicine

## 2020-10-27 DIAGNOSIS — I208 Other forms of angina pectoris: Secondary | ICD-10-CM

## 2020-10-29 ENCOUNTER — Other Ambulatory Visit: Payer: Self-pay

## 2020-10-29 ENCOUNTER — Ambulatory Visit (HOSPITAL_COMMUNITY)
Admission: RE | Admit: 2020-10-29 | Discharge: 2020-10-29 | Disposition: A | Discharge status: Home / Self Care | Payer: Medicare Other | Source: Ambulatory Visit | Attending: Radiation Oncology | Admitting: Radiation Oncology

## 2020-10-29 DIAGNOSIS — I2510 Atherosclerotic heart disease of native coronary artery without angina pectoris: Secondary | ICD-10-CM | POA: Diagnosis not present

## 2020-10-29 DIAGNOSIS — J432 Centrilobular emphysema: Secondary | ICD-10-CM | POA: Diagnosis not present

## 2020-10-29 DIAGNOSIS — C3411 Malignant neoplasm of upper lobe, right bronchus or lung: Secondary | ICD-10-CM | POA: Diagnosis not present

## 2020-10-29 DIAGNOSIS — I358 Other nonrheumatic aortic valve disorders: Secondary | ICD-10-CM | POA: Diagnosis not present

## 2020-10-29 DIAGNOSIS — S2241XA Multiple fractures of ribs, right side, initial encounter for closed fracture: Secondary | ICD-10-CM | POA: Diagnosis not present

## 2020-10-29 MED ORDER — IOHEXOL 300 MG/ML  SOLN
75.0000 mL | Freq: Once | INTRAMUSCULAR | Status: AC | PRN
  Administered 2020-10-29: 75 mL via INTRAVENOUS

## 2020-10-30 ENCOUNTER — Ambulatory Visit
Admission: RE | Admit: 2020-10-30 | Discharge: 2020-10-30 | Disposition: A | Discharge status: Home / Self Care | Payer: Medicare Other | Source: Ambulatory Visit | Attending: Radiation Oncology | Admitting: Radiation Oncology

## 2020-10-30 DIAGNOSIS — C3411 Malignant neoplasm of upper lobe, right bronchus or lung: Secondary | ICD-10-CM

## 2020-10-30 DIAGNOSIS — Z08 Encounter for follow-up examination after completed treatment for malignant neoplasm: Secondary | ICD-10-CM | POA: Diagnosis not present

## 2020-11-02 ENCOUNTER — Ambulatory Visit
Admission: RE | Admit: 2020-11-02 | Discharge: 2020-11-02 | Disposition: A | Discharge status: Home / Self Care | Payer: Medicare Other | Source: Ambulatory Visit | Attending: Radiation Oncology | Admitting: Radiation Oncology

## 2020-11-02 DIAGNOSIS — C3411 Malignant neoplasm of upper lobe, right bronchus or lung: Secondary | ICD-10-CM

## 2020-11-04 ENCOUNTER — Other Ambulatory Visit: Payer: Self-pay

## 2020-11-04 ENCOUNTER — Other Ambulatory Visit: Payer: Self-pay | Admitting: Radiation Oncology

## 2020-11-04 DIAGNOSIS — C3411 Malignant neoplasm of upper lobe, right bronchus or lung: Secondary | ICD-10-CM

## 2020-11-04 NOTE — Progress Notes (Signed)
Radiation Oncology         [phone] ________________________________  Outpatient Follow Up - Conducted via telephone due to current COVID-19 concerns for limiting patient exposure  I spoke with the patient to conduct this consult visit via telephone to spare the patient unnecessary potential exposure in the healthcare setting during the current COVID-19 pandemic. The patient was notified in advance and was offered a [place] meeting to allow for face to face communication but unfortunately reported that they did not have the appropriate resources/technology to support such a visit and instead preferred to proceed with a telephone visit.   Name: Lucas [name]        MRN: [id number]  Date of Service: 10/30/2020 DOB: 27-Nov-1950  KP:[id number], Ronnette Juniper, DO  Icard, Octavio Graves, DO     REFERRING PHYSICIAN: Garner Nash, DO   DIAGNOSIS: The encounter diagnosis was Malignant neoplasm of right upper lobe of lung ([place]).   HISTORY OF PRESENT ILLNESS: Lucas [name] is a 70 y.o. male with a Stage IA2, cT1bN0M0, NSCLC, Squamous Cell Carcinoma of the RUL diagonsed with bronchoscopy on 05/04/2020, atypical cells were seen in the cytology, and squamous cell carcinoma was confirmed by biopsy of the lesion.  He is not a good surgical candidate medically per Dr. Valeta Harms, so at the time of his bronchoscopy fiducial markers were placed.  He subsequenty underwent stereotactic body radiotherapy (SBRT) in August 2021. He has been followed expectantly in surveillance. His most recent CT scan on 10/29/20 revealed similar appearance changes in the RUL with post treatment effect of the visible previously treated nodule measuring 1.3 cm without new or progressive changes. His prior on .07/24/20 showed upper limit size of a right hilar node. This was not seen on the scan this month. He's called to review these results. Persistent chronic stigmata of atherosclerotic disease and emphysematous disease is again seen.   On review of  systems, the patient reports that he is doing well overall. He feels well as long as he uses oxygen and typically uses this at 2-3L. He denies any chest pain, shortness of breath, cough, fevers, chills, night sweats, unintended weight changes. He denies any bowel or bladder disturbances, and denies abdominal pain, nausea or vomiting. He denies any new musculoskeletal or joint aches or pains, new skin lesions or concerns. A complete review of systems is obtained and is otherwise negative.     PREVIOUS RADIATION THERAPY:   06/11/20 - 06/18/20 SBRT Treatment: The patient was treated to the right upper lobe lung with a course of stereotactic body radiation treatment.  The patient received 54 Gray in 3 fractions using a SBRT/IMRT technique, with 3 fields.    PAST MEDICAL HISTORY:  Past Medical History:  Diagnosis Date  . Acute blood loss anemia 06/21/2018  . Acute upper GI bleed 06/21/2018  . Allergy   . Bilateral AVN of femurs ([place]) 06/25/2018   See 11/2017 Chest CT report  . COPD 12/21/2006  . Environmental allergies   . History of femur fracture 06/25/2018   11/2017 CT AP showed Remote nonunited right femur greater trochanter fracture  . History of Fracture of right tibia 06/26/2018  . History of multiple right thoacic rib fractures 06/25/2018  . History of Right clavicular fracture 06/25/2018  . HYPERLIPIDEMIA 12/21/2006  . HYPERTENSION, BENIGN SYSTEMIC 12/21/2006  . Mallory-Weiss tear   . TOBACCO DEPENDENCE 12/21/2006       PAST SURGICAL HISTORY: Past Surgical History:  Procedure Laterality Date  . ANKLE SURGERY  left ankle  . APPLICATION OF WOUND VAC  01/06/2012   Procedure: APPLICATION OF WOUND VAC;  Surgeon: Meredith Pel, MD;  Location: [facility];  Service: Orthopedics;  Laterality: Left;  . APPLICATION OF WOUND VAC Right 10/17/2015   Procedure: APPLICATION OF WOUND VAC;  Surgeon: Leandrew Koyanagi, MD;  Location: [facility];  Service: Orthopedics;  Laterality: Right;  . BIOPSY  06/22/2018    Procedure: BIOPSY;  Surgeon: [id number] v, DO;  Location: [facility];  Service: Gastroenterology;;  . BRONCHIAL BIOPSY  05/04/2020   Procedure: BRONCHIAL BIOPSIES;  Surgeon: Garner Nash, DO;  Location: [facility];  Service: Pulmonary;;  . BRONCHIAL BRUSHINGS  05/04/2020   Procedure: BRONCHIAL BRUSHINGS;  Surgeon: Garner Nash, DO;  Location: [facility];  Service: Pulmonary;;  . BRONCHIAL NEEDLE ASPIRATION BIOPSY  05/04/2020   Procedure: BRONCHIAL NEEDLE ASPIRATION BIOPSIES;  Surgeon: Garner Nash, DO;  Location: [facility];  Service: Pulmonary;;  . BRONCHIAL WASHINGS  05/04/2020   Procedure: BRONCHIAL WASHINGS;  Surgeon: Garner Nash, DO;  Location: [facility];  Service: Pulmonary;;  . CORONARY ARTERY BYPASS GRAFT N/A 12/16/2019   Procedure: CORONARY ARTERY BYPASS GRAFTING (CABG) TIMES 4 USING LEFT INTERNAL MAMMARY ARTERY AND ENDOSCOPICALLY HARVESTED RIGHT SAPHENOUS VEIN;  Surgeon: Lajuana Matte, MD;  Location: [facility];  Service: Open Heart Surgery;  Laterality: N/A;  . ESOPHAGOGASTRODUODENOSCOPY (EGD) WITH PROPOFOL N/A 06/22/2018   Procedure: ESOPHAGOGASTRODUODENOSCOPY (EGD) WITH PROPOFOL;  Surgeon: [id number] v, DO;  Location: [facility];  Service: Gastroenterology;  Laterality: N/A;  . EXTERNAL FIXATION LEG  01/06/2012   Procedure: EXTERNAL FIXATION LEG;  Surgeon: Meredith Pel, MD;  [facility];  Service: Orthopedics;  Laterality: Left;  Smith-nephew external fixator set  . EXTERNAL FIXATION LEG Right 09/16/2015   Procedure: EXTERNAL FIXATION LEG/LARGE;  Surgeon: Mcarthur Rossetti, MD;  Location: [facility];  Service: Orthopedics;  Laterality: Right;  . EXTERNAL FIXATION REMOVAL Right 09/22/2015   Procedure: REMOVAL EXTERNAL FIXATION LEG;  Surgeon: Mcarthur Rossetti, MD;  Location: [facility];  Service: Orthopedics;  Laterality: Right;  . FASCIOTOMY  01/10/2012   Procedure: FASCIOTOMY;  Surgeon: Rozanna Box, MD;  Location: [facility];  Service:  Orthopedics;  Laterality: Left;  status post fasciotomy with wound vac left calf; adjustment external fixator left tibia under fluoro; retention suture/wound vac placement left lateral calf wound  . FASCIOTOMY  01/06/2012   Procedure: FASCIOTOMY;  Surgeon: Meredith Pel, MD;  [facility];  Service: Orthopedics;  Laterality: Left;  Four compartment fasciotomy  . FIDUCIAL MARKER PLACEMENT  05/04/2020   Procedure: FIDUCIAL MARKER PLACEMENT;  Surgeon: Garner Nash, DO;  Location: [facility];  Service: Pulmonary;;  . HOT HEMOSTASIS N/A 06/22/2018   Procedure: HEMOSTASIS;  Surgeon: Dorothea Ogle v, DO;  Location: [facility];  Service: Gastroenterology;  Laterality: N/A;  Epinepherine and clip placed  . I & D EXTREMITY  01/12/2012   Procedure: IRRIGATION AND DEBRIDEMENT EXTREMITY;  Surgeon: Rozanna Box, MD;  Location: [facility];  Service: Orthopedics;  Laterality: Left;  LAYERD CLOSURE LEFT LEG WOUND  . I & D EXTREMITY Right 10/17/2015   Procedure: IRRIGATION AND DEBRIDEMENT EXTREMITY;  Surgeon: Leandrew Koyanagi, MD;  Location: [facility];  Service: Orthopedics;  Laterality: Right;  . I & D EXTREMITY Right 10/20/2015   Procedure: REPEAT IRRIGATION AND DEBRIDEMENT EXTREMITY, right lower leg;  Surgeon: Mcarthur Rossetti, MD;  Location: [facility];  Service: Orthopedics;  Laterality: Right;  . KNEE SURGERY     left  knee and right knee  . LEFT HEART CATH AND CORONARY ANGIOGRAPHY N/A 06/27/2018   Procedure: LEFT HEART CATH AND CORONARY ANGIOGRAPHY;  Surgeon: Martinique, Peter M, MD;  Location: [facility];  Service: Cardiovascular;  Laterality: N/A;  . LEFT HEART CATH AND CORONARY ANGIOGRAPHY N/A 12/12/2019   Procedure: LEFT HEART CATH AND CORONARY ANGIOGRAPHY;  Surgeon: Wellington Hampshire, MD;  Location: [facility];  Service: Cardiovascular;  Laterality: N/A;  . MASS EXCISION Left 09/22/2015   Procedure: EXCISION MASS;  Surgeon: Mcarthur Rossetti, MD;  Location: [facility];  Service:  Orthopedics;  Laterality: Left;  . ORIF TIBIA PLATEAU  02/07/2012   Procedure: OPEN REDUCTION INTERNAL FIXATION (ORIF) TIBIAL PLATEAU;  Surgeon: Rozanna Box, MD;  Location: [facility];  Service: Orthopedics;  Laterality: Left;  ORIF LEFT TIBIAL PLATEAU/ REMOVE EXTERNAL FIXATION LEFT LEG  . ORIF TIBIA PLATEAU Right 09/22/2015   Procedure: REMOVAL OF EXTERNAL FIXATOR RIGHT LEG, OPEN REDUCTION INTERNAL FIXATION (ORIF) RIGHT TIBIAL PLATEAU, EXCISION SMALL MASS LEFT LEG;  Surgeon: Mcarthur Rossetti, MD;  Location: [facility];  Service: Orthopedics;  Laterality: Right;  . SKIN SPLIT GRAFT Right 10/23/2015   Procedure: RIGHT GASTROC FLAP SPLIT THICKNESS SKIN GRAFT FROM RIGHT THING TO RIGHT LEG;  Surgeon: Irene Limbo, MD;  Location: [facility];  Service: Plastics;  Laterality: Right;  . TEE WITHOUT CARDIOVERSION N/A 12/16/2019   Procedure: TRANSESOPHAGEAL ECHOCARDIOGRAM (TEE);  Surgeon: Lajuana Matte, MD;  Location: [facility];  Service: Open Heart Surgery;  Laterality: N/A;  . VIDEO BRONCHOSCOPY WITH ENDOBRONCHIAL NAVIGATION Right 05/04/2020   Procedure: VIDEO BRONCHOSCOPY WITH ENDOBRONCHIAL NAVIGATION;  Surgeon: Garner Nash, DO;  Location: [facility];  Service: Pulmonary;  Laterality: Right;  . WRIST SURGERY     left     FAMILY HISTORY:  Family History  Problem Relation Age of Onset  . Emphysema Father      SOCIAL HISTORY:  reports that he has been smoking cigarettes. He has a 25.50 pack-year smoking history. He quit smokeless tobacco use about 55 years ago.  His smokeless tobacco use included snuff and chew. He reports current alcohol use of about 6.0 standard drinks of alcohol per week. He reports that he does not use drugs. The patient is married and lives in [place].   ALLERGIES: Ace inhibitors, Ampicillin, Penicillins, Codeine, and Lisinopril   MEDICATIONS:  Current Outpatient Medications  Medication Sig Dispense Refill  . albuterol (VENTOLIN HFA) 108 (90 Base) MCG/ACT inhaler  INHALE 2 PUFFS BY MOUTH EVERY 4 HOURS AS NEEDED FOR WHEEZE OR FOR SHORTNESS OF BREATH 6.7 each 6  . ANORO ELLIPTA 62.5-25 MCG/INH AEPB TAKE 1 PUFF BY MOUTH EVERY DAY (Patient taking differently: Inhale 1 puff into the lungs daily.) 60 each 3  . aspirin EC 81 MG tablet Take 1 tablet (81 mg total) by mouth daily. (Patient not taking: No sig reported) 30 tablet 11  . bisacodyl (BISACODYL) 5 MG EC tablet Take 15 mg by mouth daily as needed for moderate constipation.  (Patient not taking: No sig reported)    . famotidine (PEPCID) 20 MG tablet Take 1 tablet (20 mg total) by mouth 2 (two) times daily. 60 tablet 0  . fluticasone (FLONASE) 50 MCG/ACT nasal spray SPRAY 2 SPRAYS INTO EACH NOSTRIL EVERY DAY 16 g 2  . folic acid (FOLVITE) 1 MG tablet Take 1 tablet (1 mg total) by mouth daily. 30 tablet 0  . ipratropium-albuterol (DUONEB) 0.5-2.5 (3) MG/3ML SOLN Take 3 mLs by  nebulization every 6 (six) hours as needed. (Patient taking differently: Take 3 mLs by nebulization every 6 (six) hours as needed (for wheezing and shortness of breath).) 360 mL 0  . metoprolol tartrate (LOPRESSOR) 25 MG tablet Take 1 tablet (25 mg total) by mouth 2 (two) times daily. 30 tablet 0  . nitroGLYCERIN (NITROSTAT) 0.4 MG SL tablet PLACE 1 TABLET (0.4 MG TOTAL) UNDER THE TONGUE EVERY 5 (FIVE) MINUTES AS NEEDED FOR CHEST PAIN. 150 tablet 1  . OXYGEN Inhale 3-4 L into the lungs See admin instructions. Continuous at night, as needed during the day    . oxymetazoline (AFRIN) 0.05 % nasal spray Place 1 spray into both nostrils 2 (two) times daily as needed for congestion (nose bleeds).     . rosuvastatin (CRESTOR) 40 MG tablet Take 1 tablet (40 mg total) by mouth daily. (Patient not taking: No sig reported) 90 tablet 3  . thiamine 100 MG tablet Take 1 tablet (100 mg total) by mouth daily. (Patient not taking: Reported on 10/26/2020) 30 tablet 0  . vitamin B-12 1000 MCG tablet Take 1 tablet (1,000 mcg total) by mouth daily. 30 tablet 1   No  current facility-administered medications for this encounter.       PHYSICAL EXAM:  Unable to assess due to encounter type.  ECOG = 1  0 - Asymptomatic (Fully active, able to carry on all predisease activities without restriction)  1 - Symptomatic but completely ambulatory (Restricted in physically strenuous activity but ambulatory and able to carry out work of a light or sedentary nature. For example, light housework, office work)  2 - Symptomatic, <50% in bed during the day (Ambulatory and capable of all self care but unable to carry out any work activities. Up and about more than 50% of waking hours)  3 - Symptomatic, >50% in bed, but not bedbound (Capable of only limited self-care, confined to bed or chair 50% or more of waking hours)  4 - Bedbound (Completely disabled. Cannot carry on any self-care. Totally confined to bed or chair)  5 - Death   Eustace Pen MM, Creech RH, Tormey DC, et al. [phone]). "Toxicity and response criteria of the [facility]". [place] Oncol. 5 (6): 649-55    LABORATORY DATA:  Lab Results  Component Value Date   WBC 4.4 09/30/2020   HGB 11.9 (L) 09/30/2020   HCT 35.9 (L) 09/30/2020   MCV 96.0 09/30/2020   PLT 155 09/30/2020   Lab Results  Component Value Date   NA 139 09/29/2020   K 4.6 09/29/2020   CL 101 09/29/2020   CO2 27 09/29/2020   Lab Results  Component Value Date   ALT 37 09/29/2020   AST 51 (H) 09/29/2020   ALKPHOS 80 09/29/2020   BILITOT 0.9 09/29/2020      RADIOGRAPHY: CT Chest W Contrast  Result Date: 10/29/2020 CLINICAL DATA:  71 year old male history of non-small cell lung cancer diagnosed in 2020. Radiation therapy completed in 2021. EXAM: CT CHEST WITH CONTRAST TECHNIQUE: Multidetector CT imaging of the chest was performed during intravenous contrast administration. CONTRAST:  70mL OMNIPAQUE IOHEXOL 300 MG/ML  SOLN COMPARISON:  Chest CT 07/24/2020. FINDINGS: Cardiovascular: Heart size is normal. There  is no significant pericardial fluid, thickening or pericardial calcification. There is aortic atherosclerosis, as well as atherosclerosis of the great vessels of the mediastinum and the coronary arteries, including calcified atherosclerotic plaque in the left main, left anterior descending, left circumflex and right coronary arteries. Status  post median sternotomy for CABG including LIMA to the LAD. Thickening calcification of the aortic valve. Mediastinum/Nodes: No pathologically enlarged mediastinal or hilar lymph nodes. Esophagus is unremarkable in appearance. No axillary lymphadenopathy. Lungs/Pleura: Fiducial markers are noted in the right upper lobe. In the adjacent lung parenchyma, there are areas of septal thickening and some nodular areas of architectural distortion. This includes a solid appearing nodular area measuring 1.3 x 0.9 cm (axial image 38 of series 5) which was previously centrally cavitary. No other suspicious appearing pulmonary nodules or masses are noted. No acute consolidative airspace disease. No pleural effusions. Diffuse bronchial wall thickening with mild to moderate centrilobular and paraseptal emphysema. Upper Abdomen: Aortic atherosclerosis. Subcentimeter low-attenuation lesion in the interpolar region of the right kidney, too small to characterize, but statistically likely to represent a tiny cyst. Musculoskeletal: Median sternotomy wires. Numerous old healed right-sided rib fractures. There are no aggressive appearing lytic or blastic lesions noted in the visualized portions of the skeleton. IMPRESSION: 1. Previously noted treated cavitary nodule in the right upper lobe is more solid in appearance on today's examination, but similar in overall size currently measuring 1.3 x 0.9 cm, with evolving surrounding postradiation changes. Continued attention on future follow-up studies is recommended to ensure stability or regression. 2. Aortic atherosclerosis, in addition to left main and 3  vessel coronary artery disease. Status post median sternotomy for CABG including LIMA to the LAD. 3. There are calcifications of the aortic valve. Echocardiographic correlation for evaluation of potential valvular dysfunction may be warranted if clinically indicated. 4. Diffuse bronchial wall thickening with mild to moderate centrilobular and paraseptal emphysema; imaging findings suggestive of underlying COPD. Aortic Atherosclerosis (ICD10-I70.0) and Emphysema (ICD10-J43.9). Electronically Signed   By: Vinnie Langton M.D.   On: 10/29/2020 19:42       IMPRESSION/PLAN: 1. Stage IA2, cT1bN0M0, NSCLC, Squamous Cell Carcinoma of the RUL. The patient is doing well clinically and radiographically and we will plan to repeat CT scan in 6 months per NCCN guidelines. He is in agreement and will call with questions or concerns prior to his next visit.  2. COPD/Atherosclerosis. He is aware of the chronic imaging findings as well as the role of close follow up with his pulmonary and internal medicine providers, and to be seen urgently if he has acute symptoms.   Given current concerns for patient exposure during the COVID-19 pandemic, this encounter was conducted via telephone.  The patient has provided two factor identification and has given verbal consent for this type of encounter and has been advised to only accept a meeting of this type in a secure network environment. The time spent during this encounter was 40 minutes including preparation, discussion, and coordination of the patient's care. The attendants for this meeting include  Hayden Pedro  and [name].  During the encounter,   Hayden Pedro was located remotely at home. Lucas [name] was located at home.       Carola Rhine, PAC

## 2020-11-13 ENCOUNTER — Other Ambulatory Visit: Payer: Medicare Other | Admitting: Hospice

## 2020-11-13 ENCOUNTER — Other Ambulatory Visit: Payer: Self-pay | Admitting: Internal Medicine

## 2020-11-13 ENCOUNTER — Other Ambulatory Visit: Payer: Self-pay

## 2020-11-13 DIAGNOSIS — J42 Unspecified chronic bronchitis: Secondary | ICD-10-CM

## 2020-11-13 DIAGNOSIS — Z515 Encounter for palliative care: Secondary | ICD-10-CM

## 2020-11-13 NOTE — Progress Notes (Signed)
PATIENT NAME: Lucas [name] [place] [address] [phone] (home)  DOB: [birth year] MRN: [id number]  PRIMARY CARE PROVIDER:    [name], DO,  [place] [address] [phone]  REFERRING PROVIDER:   [name], [place],  [place] [address] [phone]  RESPONSIBLE PARTY: Self   Extended Emergency Contact Information Primary Emergency Contact: Jump,Peggy Address: [place], [place] [address] [place] Phone: [phone] Mobile Phone: [phone] Relation: Spouse  [phone] is best number to call.    I met face to face with patient and family in home/facility.  ADVANCE CARE PLANNING/RECOMMENDATIONS/PLAN:    Visit at the request of Dr [name]  for palliative consult. Visit consisted of building trust and discussions on Palliative Medicine as specialized medical care for people living with serious illness, aimed at facilitating better quality of life through symptoms relief, assisting with advance care plan and establishing complex decision making. Peggy and three family friends present for visit.   Discussion on the difference between Palliative and Hospice care. Palliative care and hospice have similar goals of managing symptoms, promoting comfort, improving quality of life, and maintaining a person's dignity. However, palliative care may be offered during any phase of a serious illness, while hospice care is usually offered when a person is expected to live for 6 months or less.  Visit consisted of counseling and education dealing with the complex and emotionally intense issues of symptom management and palliative care in the setting of serious and potentially life-threatening illness. Palliative care team will continue to support patient, patient's family, and medical team.  Advance Care Planning: Our advance care planning conversation included  a discussion about:    The value and importance of advance care planning  Exploration of goals of care in the event of a sudden injury or illness  Identification and preparation of a healthcare agent          Review and updating or creation of an advance directive document      CODE STATUS: Patient is a Full code.   GOALS OF CARE: Goals of care include to maximize quality of life and symptom management.  Follow up Palliative Care Visit: Palliative care will continue to follow for goals of care clarification and symptom management.   Symptom Management:  Patient denies shortness of breath related to COPD and history of lung cancer.  Patient is still smoking. Education on smoking Cessation. Patient agreed to cut back. Use oxygen supplementation as needed at 3 L/min; continue breathing treatments as ordered.  Patient is compliant with his medications.  In no medical acuity and has no concerns at this time patient on oxygen supplementation at 3L/Min. Continue breathing treatments. Provided general support and encouragement, no other unmet needs identified at this time. Palliative will continue to monitor for symptom management/decline and make recommendations as needed.  PPS: 60%  Family /Caregiver/Community Supports: Patient  Lives at home with spouse. Strong family support system.   I spent one hour and 25 minutes providing this initial consultation; time includes time spent with patient/family, chart review, provider coordination,  and documentation. More than 50% of the time in this consultation was spent on counseling patient and coordinating communication.   CHIEF COMPLAIN/HISTORY OF PRESENT ILLNESS:  Lucas [name] is a 70 y.o. male with multiple medical problems including COPD, hx of  lung CA, s/p radiation. To go back to Oncologist in six months for check up.  History obtained from review of EMR, discussion with patient/family.  HOSPICE ELIGIBILITY/DIAGNOSIS: TBD  PAST MEDICAL HISTORY:   Past Medical History:  Diagnosis Date   Acute blood loss anemia 06/21/2018   Acute upper GI bleed 06/21/2018   Allergy    Bilateral AVN of femurs ([facility]) 06/25/2018   See 11/2017 Chest CT report   COPD 12/21/2006   Environmental allergies    History of femur fracture 06/25/2018   11/2017 CT AP showed Remote nonunited right femur greater trochanter fracture   History of Fracture of right tibia 06/26/2018   History of multiple right thoacic rib fractures 06/25/2018   History of Right clavicular fracture 06/25/2018   HYPERLIPIDEMIA 12/21/2006   HYPERTENSION, BENIGN SYSTEMIC 12/21/2006   Mallory-Weiss tear    TOBACCO DEPENDENCE 12/21/2006    SOCIAL HX:  Social History   Tobacco Use   Smoking status: Current Every Day Smoker    Packs/day: 0.50    Years: 51.00    Pack years: 25.50    Types: Cigarettes   Smokeless tobacco: Former Systems developer    Types: Snuff, Chew    Quit date: 01/05/1965   Tobacco comment: currently half a pack a day-05/26/2020  Substance Use Topics   Alcohol use: Yes    Alcohol/week: 6.0 standard drinks    Types: 6 Cans of beer per week    Comment: drinks about a 6-pack/day   FAMILY HX:  Family History  Problem Relation Age of Onset   Emphysema Father      ALLERGIES:  Allergies  Allergen Reactions   Ace Inhibitors Swelling   Ampicillin Nausea And Vomiting   Penicillins Nausea And Vomiting    Did it involve swelling of the face/tongue/throat, SOB, or low BP? N Did it involve sudden or severe rash/hives, skin peeling, or any reaction on the inside of your mouth or nose? N Did you need to seek medical attention at a hospital or doctor's office? N When did it last happen?Teenager If all above answers are NO, may proceed with cephalosporin use.   Codeine Nausea And Vomiting   Lisinopril Swelling    REACTION: angioedema     PERTINENT MEDICATIONS:  Outpatient Encounter Medications as of 11/13/2020  Medication Sig   albuterol (VENTOLIN HFA)  108 (90 Base) MCG/ACT inhaler INHALE 2 PUFFS BY MOUTH EVERY 4 HOURS AS NEEDED FOR WHEEZE OR FOR SHORTNESS OF BREATH   ANORO ELLIPTA 62.5-25 MCG/INH AEPB TAKE 1 PUFF BY MOUTH EVERY DAY (Patient taking differently: Inhale 1 puff into the lungs daily.)   aspirin EC 81 MG tablet Take 1 tablet (81 mg total) by mouth daily. (Patient not taking: No sig reported)   bisacodyl (BISACODYL) 5 MG EC tablet Take 15 mg by mouth daily as needed for moderate constipation.  (Patient not taking: No sig reported)   famotidine (PEPCID) 20 MG tablet Take 1 tablet (20 mg total) by mouth 2 (two) times daily.   fluticasone (FLONASE) 50 MCG/ACT nasal spray SPRAY 2 SPRAYS INTO EACH NOSTRIL EVERY DAY   folic acid (FOLVITE) 1 MG tablet Take 1 tablet (1 mg total) by mouth daily.   ipratropium-albuterol (DUONEB) 0.5-2.5 (3) MG/3ML SOLN Take 3 mLs by nebulization every 6 (six) hours as needed. (Patient taking differently: Take 3 mLs by nebulization every 6 (six) hours as needed (for wheezing and shortness of breath).)   metoprolol tartrate (LOPRESSOR) 25 MG tablet Take 1 tablet (25 mg total)  by mouth 2 (two) times daily.   nitroGLYCERIN (NITROSTAT) 0.4 MG SL tablet PLACE 1 TABLET (0.4 MG TOTAL) UNDER THE TONGUE EVERY 5 (FIVE) MINUTES AS NEEDED FOR CHEST PAIN.   OXYGEN Inhale 3-4 L into the lungs See admin instructions. Continuous at night, as needed during the day   oxymetazoline (AFRIN) 0.05 % nasal spray Place 1 spray into both nostrils 2 (two) times daily as needed for congestion (nose bleeds).    rosuvastatin (CRESTOR) 40 MG tablet Take 1 tablet (40 mg total) by mouth daily. (Patient not taking: No sig reported)   thiamine 100 MG tablet Take 1 tablet (100 mg total) by mouth daily. (Patient not taking: Reported on 10/26/2020)   vitamin B-12 1000 MCG tablet Take 1 tablet (1,000 mcg total) by mouth daily.   [DISCONTINUED] loratadine (CLARITIN) 10 MG tablet Take 1 tablet (10 mg total) by mouth daily.   No  facility-administered encounter medications on file as of 11/13/2020.    PHYSICAL EXAM/ROS:  General: NAD, cooperative Cardiovascular: regular rate and rhythm; denies chest pain Pulmonary: no shortness of breath/wheezing, no cough; normal respiratory effort on room air. Abdomen: soft, nontender, + bowel sounds GU: no suprapubic tenderness Extremities: no edema, no joint deformities Skin: no rashes to visible skin Neurological: Weakness but otherwise nonfocal  Palliative Care was asked to follow this patient by consultation request of [name], DO to help address advance care planning and complex decision making. Thank you for the opportunity to participate in the care of Lucas [name] Please call our office at [phone] if we can be of additional assistance.  Note: Portions of this note were generated with Lobbyist. Dictation errors may occur despite best attempts at proofreading.  Teodoro Spray, NP

## 2020-11-25 DIAGNOSIS — I2510 Atherosclerotic heart disease of native coronary artery without angina pectoris: Secondary | ICD-10-CM | POA: Diagnosis not present

## 2020-11-25 DIAGNOSIS — J90 Pleural effusion, not elsewhere classified: Secondary | ICD-10-CM | POA: Diagnosis not present

## 2020-11-25 DIAGNOSIS — J449 Chronic obstructive pulmonary disease, unspecified: Secondary | ICD-10-CM | POA: Diagnosis not present

## 2020-12-16 ENCOUNTER — Other Ambulatory Visit: Payer: Self-pay | Admitting: Family Medicine

## 2020-12-16 ENCOUNTER — Other Ambulatory Visit: Payer: Self-pay | Admitting: Physician Assistant

## 2020-12-17 ENCOUNTER — Other Ambulatory Visit: Payer: Self-pay

## 2020-12-17 DIAGNOSIS — J449 Chronic obstructive pulmonary disease, unspecified: Secondary | ICD-10-CM

## 2020-12-17 MED ORDER — ANORO ELLIPTA 62.5-25 MCG/INH IN AEPB
INHALATION_SPRAY | RESPIRATORY_TRACT | 3 refills | Status: DC
Start: 2020-12-17 — End: 2021-04-28

## 2020-12-17 MED ORDER — ALBUTEROL SULFATE HFA 108 (90 BASE) MCG/ACT IN AERS: INHALATION_SPRAY | RESPIRATORY_TRACT | 6 refills | Status: DC

## 2020-12-23 DIAGNOSIS — J449 Chronic obstructive pulmonary disease, unspecified: Secondary | ICD-10-CM | POA: Diagnosis not present

## 2020-12-23 DIAGNOSIS — I2510 Atherosclerotic heart disease of native coronary artery without angina pectoris: Secondary | ICD-10-CM | POA: Diagnosis not present

## 2020-12-23 DIAGNOSIS — J90 Pleural effusion, not elsewhere classified: Secondary | ICD-10-CM | POA: Diagnosis not present

## 2020-12-28 ENCOUNTER — Ambulatory Visit (INDEPENDENT_AMBULATORY_CARE_PROVIDER_SITE_OTHER): Payer: Medicare Other | Admitting: Family Medicine

## 2020-12-28 ENCOUNTER — Encounter: Payer: Self-pay | Admitting: Family Medicine

## 2020-12-28 ENCOUNTER — Other Ambulatory Visit: Payer: Self-pay

## 2020-12-28 VITALS — BP 114/60 | HR 105 | Ht 71.0 in | Wt 133.4 lb

## 2020-12-28 DIAGNOSIS — F419 Anxiety disorder, unspecified: Secondary | ICD-10-CM | POA: Diagnosis not present

## 2020-12-28 DIAGNOSIS — J441 Chronic obstructive pulmonary disease with (acute) exacerbation: Secondary | ICD-10-CM | POA: Diagnosis not present

## 2020-12-28 DIAGNOSIS — I208 Other forms of angina pectoris: Secondary | ICD-10-CM

## 2020-12-28 DIAGNOSIS — M542 Cervicalgia: Secondary | ICD-10-CM

## 2020-12-28 MED ORDER — AZITHROMYCIN 250 MG PO TABS: ORAL_TABLET | ORAL | 0 refills | Status: DC

## 2020-12-28 MED ORDER — HYDROXYZINE HCL 10 MG PO TABS: 10.0000 mg | ORAL_TABLET | Freq: Three times a day (TID) | ORAL | 0 refills | Status: DC | PRN

## 2020-12-28 MED ORDER — DICLOFENAC SODIUM 1 % EX GEL: 2.0000 g | Freq: Four times a day (QID) | CUTANEOUS | 1 refills | Status: AC

## 2020-12-28 MED ORDER — METOPROLOL TARTRATE 25 MG PO TABS: 25.0000 mg | ORAL_TABLET | Freq: Two times a day (BID) | ORAL | 1 refills | Status: DC

## 2020-12-28 MED ORDER — PREDNISONE 50 MG PO TABS: 50.0000 mg | ORAL_TABLET | Freq: Every day | ORAL | 0 refills | Status: AC

## 2020-12-28 NOTE — Patient Instructions (Addendum)
It was great seeing you today!   Call the palliative care team at [phone].   Continue working on quitting smoking. Use your oxygen at least 16 hours a day. Do not smoke around your oxygen tank!   For you neck pain: Take 1000 mg Tylenol every 8 hours. Continue to stretch the muscles in your neck.   For your cough: Stop by the pharmacy to pick up your antibiotics and steroids. Take as prescribed.   If you have questions or concerns please do not hesitate to call at [phone].  Dr. Rushie [facility]

## 2020-12-28 NOTE — Progress Notes (Signed)
   SUBJECTIVE:   CHIEF COMPLAINT / HPI:   Chief Complaint  Patient presents with  . Follow-up     Lucas [name] is a 70 y.o. male here for COPD follow up.   Pt reports  wearing oxygen 3L but states he does not wear oxygen very often at home. Feels shortness of breath today. Has had increased yellow sputum production. Denies chest pain,  fever, sore throat, rhinorrhea, vomiting, rash and abdominal pain.   Continues to smoke a half a pack of cigarettes daily. This is a decrease from two packs a day. Working with palliative care after his last hospitalization.   Patient reports anxiety related to his current health conditions.    PERTINENT  PMH / PSH: reviewed and updated as appropriate   OBJECTIVE:   BP 114/60   Pulse (!) 105   Ht 5\' 11"  (1.803 m)   Wt 133 lb 6 oz (60.5 kg)   SpO2 98% Comment: with oxygen  BMI 18.60 kg/m    GEN: pleasant older male, in no acute distress  NECK: normal ROM, hypertonicity of cervical and lateral trapezius, multiple trigger points identifies  CV: regular rate and rhythm, brisk cap refill RESP: no increased work of breathing, clear to ascultation bilaterally with no rales, wheezes, or rhonchi, 3L [place] MSK: thin extremities SKIN: warm, dry    ASSESSMENT/PLAN:   COPD exacerbation (HCC) Acute exacerbation. Pt with hx of small cell lung cancer s/p radiation in 2021. Follows with [facility] pulmonology. Recent pulmonology notes reviewed.  Continue Anoro-Ellipta and Albuterol prn. For acute exacerbation, treat with prednisone and azithromycin. Recommended using oxygen throughout the day. Avoid smoking around oxygen. Smoking cessation instruction/counseling given:  counseled patient on the dangers of tobacco use, advised patient to stop smoking, and reviewed strategies to maximize success.    Chronic stable angina (HCC) Stable. No chest pain today. ASA discontinued due to frequent nosebleeds. Hx of severely calcified 3-vessel CAD with normal LV systolic  function and mildly elevated LV end-diastolic pressure. Taking metoprolol, Crestor.  Refilled Metoprolol. Follows with Dr. Martinique with [facility]. S/p CABG.   Anxiety Related to thoughts about his severe chronic health conditions. Trial Atarax 10 mg TID PRN.   Neck pain Suspect MSK in origin. Tylenol TID PRN. Volaren gel QID PRN.      Lucas Hensen, DO PGY-2, [facility] 12/28/2020

## 2020-12-31 DIAGNOSIS — M542 Cervicalgia: Secondary | ICD-10-CM | POA: Insufficient documentation

## 2020-12-31 DIAGNOSIS — F419 Anxiety disorder, unspecified: Secondary | ICD-10-CM | POA: Insufficient documentation

## 2020-12-31 NOTE — Assessment & Plan Note (Addendum)
Stable. No chest pain today. ASA discontinued due to frequent nosebleeds. Hx of severely calcified 3-vessel CAD with normal LV systolic function and mildly elevated LV end-diastolic pressure. Taking metoprolol, Crestor.  Refilled Metoprolol. Follows with Dr. Martinique with [facility]. S/p CABG.

## 2020-12-31 NOTE — Assessment & Plan Note (Addendum)
Related to thoughts about his severe chronic health conditions. Trial Atarax 10 mg TID PRN.

## 2020-12-31 NOTE — Assessment & Plan Note (Signed)
Suspect MSK in origin. Tylenol TID PRN. Volaren gel QID PRN.

## 2020-12-31 NOTE — Assessment & Plan Note (Addendum)
Acute exacerbation. Pt with hx of small cell lung cancer s/p radiation in 2021. Follows with [facility] pulmonology. Recent pulmonology notes reviewed.  Continue Anoro-Ellipta and Albuterol prn. For acute exacerbation, treat with prednisone and azithromycin. Recommended using oxygen throughout the day. Avoid smoking around oxygen. Smoking cessation instruction/counseling given:  counseled patient on the dangers of tobacco use, advised patient to stop smoking, and reviewed strategies to maximize success.

## 2021-01-07 ENCOUNTER — Other Ambulatory Visit: Payer: Medicare Other | Admitting: Student

## 2021-01-07 ENCOUNTER — Other Ambulatory Visit: Payer: Self-pay

## 2021-01-07 DIAGNOSIS — Z515 Encounter for palliative care: Secondary | ICD-10-CM | POA: Diagnosis not present

## 2021-01-07 NOTE — Progress Notes (Signed)
[facility] Consult Note Telephone: [phone]  Fax: [phone]  PATIENT NAME: Lucas [name] [place] [address] [phone] (home)  DOB: Aug 07, 1951 MRN: [id number]  PRIMARY CARE PROVIDER:    [name], DO,  [place] [address] [phone]  REFERRING PROVIDER:   [name], [place],  [place] [address] [phone]  RESPONSIBLE PARTY:   Extended Emergency Contact Information Primary Emergency Contact: Damron,Peggy Address: [place], [place] [address] [place] Phone: [phone] Mobile Phone: [phone] Relation: Spouse  I met face to face with patient and family in the home.  ASSESSMENT AND RECOMMENDATIONS:   Advance Care Planning: Visit at the request of Dr. Susa Simmonds for palliative consult. Visit consisted of building trust and discussions on Palliative care medicine as specialized medical care for people living with serious illness, aimed at facilitating improved quality of life through symptoms relief, assisting with advance care planning and establishing goals of care. Education provided on Palliative Medicine vs. Hospice services. Palliative care will continue to provide support to patient, family and the medical team.  Goal of care: To maintain current level of care, have symptoms managed.  Directives: MOST form; reviewed, no changes.   Symptom Management:   Shortness of breath-patient endorses shortness of breath with exertion, increased anxiety. He recently completed prednisone and azithromycin for COPD exacerbation. He does currently smoke; education provided on smoking cessation and safety on not smoking with oxygen on; he verbalizes understanding. Recommend continue oxygen prn at 3lpm. Continue Anoro Ellipta and albuterol PRN inhalers as directed.   Anxiety-patient reports increased anxiety;  he was started on hydroxyzine 22m every 8 hours prn; he is taking once daily in AM and reports effectiveness. Continue hydroxyzine prn anxiety.   Follow up Palliative Care Visit: Palliative care will continue to follow for complex decision making and symptom management. Return in 8 weeks or prn.  Family /Caregiver/Community Supports: Palliative Medicine will continue to provide support.   I spent 40 minutes providing this consultation, from 1:45 to 2:25pm. Time includes time spent with patient/family, chart review, provider coordination, and documentation. More than 50% of the time in this consultation was spent counseling and coordinating communication.   CHIEF COMPLAINT: Palliative medicine follow up; shortness of breath.  History obtained from review of EMR, discussion with primary team, and  interview with family. Records reviewed and summarized below.  HISTORY OF PRESENT ILLNESS:  DGOVIND FUREYis a 70y.o. year old male with multiple medical problems including COPD, hx of Lung Cancer s/p radiation. Palliative Care was asked to follow this patient by consultation request of BLyndee Hensen DO to help address advance care planning and goals of care. This is a follow up visit.  Patient reports doing well. He was seen by PCP on 3/7 and started on azithromycin and prednisone x 5 days for COPD exacerbation. He reports an improvement in cough and phlegm. He denies pain, chest pain. Shortness of breath with exertion; wears oxygen prn at 3lpm. He states his anxiety is much better since starting hydroxyzine; he is taking it each morning. Ambulates without assistive device. Sleeping well at night; does need to sleep elevated. No recent falls or injury.   CODE STATUS: Full Code  PPS: 60%  HOSPICE ELIGIBILITY/DIAGNOSIS: TBD  ROS   General: NAD EYES: denies vision changes ENMT: denies dysphagia  Cardiovascular: denies chest pain Pulmonary: occasional cough, SOB with exertion Abdomen: endorses  good appetite, endorses occasional constipation GU: denies dysuria MSK:  ambulatory, no falls reported Skin: denies rashes or wounds Neurological: endorses weakness Psych: Endorses positive mood Heme/lymph/immuno: denies bruises, abnormal bleeding   Physical Exam: Current weight: 133 pounds on 12/28/2020 Constitutional: NAD General: A & O x 4, frail appearing EYES: anicteric sclera, lids intact, no discharge  ENMT: intact hearing,oral mucous membranes moist CV: RRR no LE edema Pulmonary: LCTA, no increased work of breathing, no cough Abdomen: bowel sounds normoactive x 4 GU: deferred MSK: moves all extremities, no contractures of LE, ambulatory without assistive device Skin: warm and dry, no rashes or wounds on visible skin Neuro: Generalized weakness Psych: pleasant, non anxious affect today Hem/lymph/immuno: no widespread bruising   PAST MEDICAL HISTORY:  Past Medical History:  Diagnosis Date  . Acute blood loss anemia 06/21/2018  . Acute upper GI bleed 06/21/2018  . Allergy   . Bilateral AVN of femurs ([facility]) 06/25/2018   See 11/2017 Chest CT report  . COPD 12/21/2006  . Environmental allergies   . History of femur fracture 06/25/2018   11/2017 CT AP showed Remote nonunited right femur greater trochanter fracture  . History of Fracture of right tibia 06/26/2018  . History of multiple right thoacic rib fractures 06/25/2018  . History of Right clavicular fracture 06/25/2018  . HYPERLIPIDEMIA 12/21/2006  . HYPERTENSION, BENIGN SYSTEMIC 12/21/2006  . Mallory-Weiss tear   . TOBACCO DEPENDENCE 12/21/2006    SOCIAL HX:  Social History   Tobacco Use  . Smoking status: Current Every Day Smoker    Packs/day: 0.50    Years: 51.00    Pack years: 25.50    Types: Cigarettes  . Smokeless tobacco: Former Systems developer    Types: Snuff, Chew    Quit date: 01/05/1965  . Tobacco comment: currently half a pack a day-05/26/2020  Substance Use Topics  . Alcohol use: Yes    Alcohol/week: 6.0 standard drinks     Types: 6 Cans of beer per week    Comment: drinks about a 6-pack/day   FAMILY HX:  Family History  Problem Relation Age of Onset  . Emphysema Father     ALLERGIES:  Allergies  Allergen Reactions  . Ace Inhibitors Swelling  . Ampicillin Nausea And Vomiting  . Penicillins Nausea And Vomiting    Did it involve swelling of the face/tongue/throat, SOB, or low BP? N Did it involve sudden or severe rash/hives, skin peeling, or any reaction on the inside of your mouth or nose? N Did you need to seek medical attention at a hospital or doctor's office? N When did it last happen?Teenager If all above answers are "NO", may proceed with cephalosporin use.  . Codeine Nausea And Vomiting  . Lisinopril Swelling    REACTION: angioedema     PERTINENT MEDICATIONS:  Outpatient Encounter Medications as of 01/07/2021  Medication Sig  . albuterol (VENTOLIN HFA) 108 (90 Base) MCG/ACT inhaler INHALE 2 PUFFS BY MOUTH EVERY 4 HOURS AS NEEDED FOR WHEEZE OR FOR SHORTNESS OF BREATH  . azithromycin (ZITHROMAX) 250 MG tablet Take 2 tablets daily for 3 days.  . bisacodyl (BISACODYL) 5 MG EC tablet Take 15 mg by mouth daily as needed for moderate constipation.  (Patient not taking: No sig reported)  . diclofenac Sodium (VOLTAREN) 1 % GEL Apply 2 g topically 4 (four) times daily.  . famotidine (PEPCID) 20 MG tablet Take 1 tablet (20 mg total) by mouth  2 (two) times daily.  . fluticasone (FLONASE) 50 MCG/ACT nasal spray SPRAY 2 SPRAYS INTO EACH NOSTRIL EVERY DAY  . folic acid (FOLVITE) 1 MG tablet Take 1 tablet (1 mg total) by mouth daily.  . hydrOXYzine (ATARAX/VISTARIL) 10 MG tablet Take 1 tablet (10 mg total) by mouth every 8 (eight) hours as needed for anxiety.  Marland Kitchen ipratropium-albuterol (DUONEB) 0.5-2.5 (3) MG/3ML SOLN Take 3 mLs by nebulization every 6 (six) hours as needed. (Patient taking differently: Take 3 mLs by nebulization every 6 (six) hours as needed (for wheezing and shortness of breath).)  .  metoprolol tartrate (LOPRESSOR) 25 MG tablet Take 1 tablet (25 mg total) by mouth 2 (two) times daily.  . nitroGLYCERIN (NITROSTAT) 0.4 MG SL tablet PLACE 1 TABLET (0.4 MG TOTAL) UNDER THE TONGUE EVERY 5 (FIVE) MINUTES AS NEEDED FOR CHEST PAIN.  Marland Kitchen OXYGEN Inhale 3-4 L into the lungs See admin instructions. Continuous at night, as needed during the day  . oxymetazoline (AFRIN) 0.05 % nasal spray Place 1 spray into both nostrils 2 (two) times daily as needed for congestion (nose bleeds).   . rosuvastatin (CRESTOR) 40 MG tablet Take 1 tablet (40 mg total) by mouth daily. (Patient not taking: No sig reported)  . thiamine 100 MG tablet Take 1 tablet (100 mg total) by mouth daily. (Patient not taking: Reported on 10/26/2020)  . umeclidinium-vilanterol (ANORO ELLIPTA) 62.5-25 MCG/INH AEPB TAKE 1 PUFF BY MOUTH EVERY DAY  . vitamin B-12 1000 MCG tablet Take 1 tablet (1,000 mcg total) by mouth daily.  . [DISCONTINUED] loratadine (CLARITIN) 10 MG tablet Take 1 tablet (10 mg total) by mouth daily.   No facility-administered encounter medications on file as of 01/07/2021.     Thank you for the opportunity to participate in the care of Mr. Gibler. The palliative care team will continue to follow. Please call our office at [phone] if we can be of additional assistance.  Ezekiel Slocumb, NP

## 2021-01-11 ENCOUNTER — Telehealth: Payer: Self-pay

## 2021-01-11 NOTE — Telephone Encounter (Signed)
Left patient voicemail message in regards to telephone appointment with Shona Simpson PA on 01/18/21 @ 2:30pm. Called to review meaningful use questions. TM

## 2021-01-18 ENCOUNTER — Inpatient Hospital Stay: Admission: RE | Admit: 2021-01-18 | Payer: Self-pay | Source: Ambulatory Visit | Admitting: Radiation Oncology

## 2021-01-22 ENCOUNTER — Other Ambulatory Visit: Payer: Self-pay | Admitting: Family Medicine

## 2021-01-22 DIAGNOSIS — F419 Anxiety disorder, unspecified: Secondary | ICD-10-CM

## 2021-01-23 DIAGNOSIS — I2510 Atherosclerotic heart disease of native coronary artery without angina pectoris: Secondary | ICD-10-CM | POA: Diagnosis not present

## 2021-01-23 DIAGNOSIS — J449 Chronic obstructive pulmonary disease, unspecified: Secondary | ICD-10-CM | POA: Diagnosis not present

## 2021-01-23 DIAGNOSIS — J90 Pleural effusion, not elsewhere classified: Secondary | ICD-10-CM | POA: Diagnosis not present

## 2021-02-22 DIAGNOSIS — I2510 Atherosclerotic heart disease of native coronary artery without angina pectoris: Secondary | ICD-10-CM | POA: Diagnosis not present

## 2021-02-22 DIAGNOSIS — J90 Pleural effusion, not elsewhere classified: Secondary | ICD-10-CM | POA: Diagnosis not present

## 2021-02-22 DIAGNOSIS — J449 Chronic obstructive pulmonary disease, unspecified: Secondary | ICD-10-CM | POA: Diagnosis not present

## 2021-03-10 ENCOUNTER — Other Ambulatory Visit: Payer: Self-pay | Admitting: Family Medicine

## 2021-03-10 DIAGNOSIS — F419 Anxiety disorder, unspecified: Secondary | ICD-10-CM

## 2021-03-25 DIAGNOSIS — I2510 Atherosclerotic heart disease of native coronary artery without angina pectoris: Secondary | ICD-10-CM | POA: Diagnosis not present

## 2021-03-25 DIAGNOSIS — J449 Chronic obstructive pulmonary disease, unspecified: Secondary | ICD-10-CM | POA: Diagnosis not present

## 2021-03-25 DIAGNOSIS — J90 Pleural effusion, not elsewhere classified: Secondary | ICD-10-CM | POA: Diagnosis not present

## 2021-03-26 ENCOUNTER — Other Ambulatory Visit: Payer: Self-pay | Admitting: Family Medicine

## 2021-03-26 DIAGNOSIS — E7800 Pure hypercholesterolemia, unspecified: Secondary | ICD-10-CM

## 2021-03-26 DIAGNOSIS — I25118 Atherosclerotic heart disease of native coronary artery with other forms of angina pectoris: Secondary | ICD-10-CM

## 2021-03-30 ENCOUNTER — Other Ambulatory Visit: Payer: Self-pay | Admitting: Family Medicine

## 2021-04-08 ENCOUNTER — Encounter (HOSPITAL_COMMUNITY): Payer: Self-pay | Admitting: Emergency Medicine

## 2021-04-08 ENCOUNTER — Other Ambulatory Visit: Payer: Self-pay

## 2021-04-08 ENCOUNTER — Emergency Department (HOSPITAL_COMMUNITY)
Admission: EM | Admit: 2021-04-08 | Discharge: 2021-04-08 | Disposition: A | Discharge status: Home / Self Care | Payer: Medicare Other | Attending: Emergency Medicine | Admitting: Emergency Medicine

## 2021-04-08 ENCOUNTER — Emergency Department (HOSPITAL_COMMUNITY): Payer: Medicare Other

## 2021-04-08 DIAGNOSIS — R0602 Shortness of breath: Secondary | ICD-10-CM | POA: Diagnosis not present

## 2021-04-08 DIAGNOSIS — Z720 Tobacco use: Secondary | ICD-10-CM

## 2021-04-08 DIAGNOSIS — J441 Chronic obstructive pulmonary disease with (acute) exacerbation: Secondary | ICD-10-CM | POA: Insufficient documentation

## 2021-04-08 DIAGNOSIS — F17210 Nicotine dependence, cigarettes, uncomplicated: Secondary | ICD-10-CM | POA: Diagnosis not present

## 2021-04-08 DIAGNOSIS — I2510 Atherosclerotic heart disease of native coronary artery without angina pectoris: Secondary | ICD-10-CM | POA: Diagnosis not present

## 2021-04-08 DIAGNOSIS — R0689 Other abnormalities of breathing: Secondary | ICD-10-CM | POA: Diagnosis not present

## 2021-04-08 DIAGNOSIS — I10 Essential (primary) hypertension: Secondary | ICD-10-CM | POA: Diagnosis not present

## 2021-04-08 DIAGNOSIS — Z951 Presence of aortocoronary bypass graft: Secondary | ICD-10-CM | POA: Insufficient documentation

## 2021-04-08 DIAGNOSIS — R61 Generalized hyperhidrosis: Secondary | ICD-10-CM | POA: Diagnosis not present

## 2021-04-08 DIAGNOSIS — Z20822 Contact with and (suspected) exposure to covid-19: Secondary | ICD-10-CM | POA: Diagnosis not present

## 2021-04-08 DIAGNOSIS — S2231XA Fracture of one rib, right side, initial encounter for closed fracture: Secondary | ICD-10-CM | POA: Diagnosis not present

## 2021-04-08 DIAGNOSIS — R6889 Other general symptoms and signs: Secondary | ICD-10-CM | POA: Diagnosis not present

## 2021-04-08 DIAGNOSIS — Z79899 Other long term (current) drug therapy: Secondary | ICD-10-CM | POA: Diagnosis not present

## 2021-04-08 DIAGNOSIS — R911 Solitary pulmonary nodule: Secondary | ICD-10-CM | POA: Diagnosis not present

## 2021-04-08 DIAGNOSIS — Z743 Need for continuous supervision: Secondary | ICD-10-CM | POA: Diagnosis not present

## 2021-04-08 LAB — CBC WITH DIFFERENTIAL/PLATELET
Abs Immature Granulocytes: 0.01 10*3/uL (ref 0.00–0.07)
Basophils Absolute: 0 10*3/uL (ref 0.0–0.1)
Basophils Relative: 0 %
Eosinophils Absolute: 0 10*3/uL (ref 0.0–0.5)
Eosinophils Relative: 0 %
HCT: 40.9 % (ref 39.0–52.0)
Hemoglobin: 13.3 g/dL (ref 13.0–17.0)
Immature Granulocytes: 0 %
Lymphocytes Relative: 9 %
Lymphs Abs: 0.4 10*3/uL — ABNORMAL LOW (ref 0.7–4.0)
MCH: 31.3 pg (ref 26.0–34.0)
MCHC: 32.5 g/dL (ref 30.0–36.0)
MCV: 96.2 fL (ref 80.0–100.0)
Monocytes Absolute: 0.2 10*3/uL (ref 0.1–1.0)
Monocytes Relative: 3 %
Neutro Abs: 4.1 10*3/uL (ref 1.7–7.7)
Neutrophils Relative %: 88 %
Platelets: 157 10*3/uL (ref 150–400)
RBC: 4.25 MIL/uL (ref 4.22–5.81)
RDW: 13.1 % (ref 11.5–15.5)
WBC: 4.6 10*3/uL (ref 4.0–10.5)
nRBC: 0 % (ref 0.0–0.2)

## 2021-04-08 LAB — COMPREHENSIVE METABOLIC PANEL
ALT: 42 U/L (ref 0–44)
AST: 61 U/L — ABNORMAL HIGH (ref 15–41)
Albumin: 4.1 g/dL (ref 3.5–5.0)
Alkaline Phosphatase: 98 U/L (ref 38–126)
Anion gap: 10 (ref 5–15)
BUN: 9 mg/dL (ref 8–23)
CO2: 28 mmol/L (ref 22–32)
Calcium: 9.6 mg/dL (ref 8.9–10.3)
Chloride: 99 mmol/L (ref 98–111)
Creatinine, Ser: 0.72 mg/dL (ref 0.61–1.24)
GFR, Estimated: >60 mL/min (ref >60)
Glucose, Bld: 112 mg/dL — ABNORMAL HIGH (ref 70–99)
Potassium: 4 mmol/L (ref 3.5–5.1)
Sodium: 137 mmol/L (ref 135–145)
Total Bilirubin: 1 mg/dL (ref 0.3–1.2)
Total Protein: 7.3 g/dL (ref 6.5–8.1)

## 2021-04-08 LAB — I-STAT VENOUS BLOOD GAS, ED
Acid-Base Excess: 7 mmol/L — ABNORMAL HIGH (ref 0.0–2.0)
Bicarbonate: 31.9 mmol/L — ABNORMAL HIGH (ref 20.0–28.0)
Calcium, Ion: 1.12 mmol/L — ABNORMAL LOW (ref 1.15–1.40)
HCT: 40 % (ref 39.0–52.0)
Hemoglobin: 13.6 g/dL (ref 13.0–17.0)
O2 Saturation: 95 %
Potassium: 4 mmol/L (ref 3.5–5.1)
Sodium: 138 mmol/L (ref 135–145)
TCO2: 33 mmol/L — ABNORMAL HIGH (ref 22–32)
pCO2, Ven: 46 mmHg (ref 44.0–60.0)
pH, Ven: 7.45 — ABNORMAL HIGH (ref 7.250–7.430)
pO2, Ven: 73 mmHg — ABNORMAL HIGH (ref 32.0–45.0)

## 2021-04-08 LAB — BRAIN NATRIURETIC PEPTIDE: B Natriuretic Peptide: 46.6 pg/mL (ref 0.0–100.0)

## 2021-04-08 LAB — RESP PANEL BY RT-PCR (FLU A&B, COVID) ARPGX2
Influenza A by PCR: NEGATIVE
Influenza B by PCR: NEGATIVE
SARS Coronavirus 2 by RT PCR: NEGATIVE

## 2021-04-08 LAB — PROTIME-INR
INR: 1 (ref 0.8–1.2)
Prothrombin Time: 13.3 seconds (ref 11.4–15.2)

## 2021-04-08 LAB — TROPONIN I (HIGH SENSITIVITY)
Troponin I (High Sensitivity): 20 ng/L — ABNORMAL HIGH (ref <18)
Troponin I (High Sensitivity): 19 ng/L — ABNORMAL HIGH (ref <18)

## 2021-04-08 LAB — LACTIC ACID, PLASMA: Lactic Acid, Venous: 2 mmol/L (ref 0.5–1.9)

## 2021-04-08 MED ORDER — IPRATROPIUM-ALBUTEROL 0.5-2.5 (3) MG/3ML IN SOLN
3.0000 mL | Freq: Once | RESPIRATORY_TRACT | Status: AC
  Administered 2021-04-08: 3 mL via RESPIRATORY_TRACT
  Filled 2021-04-08: qty 3

## 2021-04-08 NOTE — Discharge Instructions (Addendum)
1.  You cannot smoke anymore cigarettes.  Doing so will give you episodes of severe difficulty breathing. 2.  See your pulmonologist or family doctor for recheck in 3 to 4 days.  Use your home nebulizer and inhalers as prescribed.

## 2021-04-08 NOTE — ED Notes (Signed)
Blood sent off.

## 2021-04-08 NOTE — ED Notes (Signed)
Pt taken off bipap and placed on 3LNC per EDP order.

## 2021-04-08 NOTE — ED Triage Notes (Signed)
Pt BIB [facility] from home, c/o increased shortness of breath and chest tightness. Hx COPD, current smoker. Initially pt 84% room air on EMS arrival, improved to 98% on cpap. Given 125mg  solumedrol and 2g mag by EMS.

## 2021-04-08 NOTE — ED Notes (Signed)
Lucas [name] wife [phone] would like an update

## 2021-04-08 NOTE — ED Provider Notes (Signed)
[facility] EMERGENCY DEPARTMENT Provider Note   CSN: [id number] Arrival date & time: 04/08/21  1523     History Chief Complaint  Patient presents with   Shortness of Breath    Lucas [name] is a 70 y.o. male.  HPI Patient has history of COPD.  He reports he only uses nighttime oxygen typically.  However sometimes he will use daytime oxygen as needed and when he does he uses 3 L.  Patient reports that his nebulizers do not seem to work for him at home so he does not use them very often.  Patient does continue to smoke.  He reports he has smoked 4 cigarettes today.  He reports his COPD attack came out of nowhere.  He states that his wife was cooking with the electric stove in the house and it seemed to "suck all the oxygen out of his machine".  He reports that the only relief he can get with standing right in front of the air conditioning cool air.  He reports that he became extremely short of breath very quickly and EMS was called.  On arrival patient was reportedly hypoxic to 84% on room air.  He was placed on CPAP and improved to 98%.  125 mg of Solu-Medrol and 2 g of mag were given by EMS patient reports he is much better now.  He denies he had any chest pain.  He denies any recent fever body ache or productive cough.  He denies has had any recent swelling or pain in the legs    Past Medical History:  Diagnosis Date   Acute blood loss anemia 06/21/2018   Acute upper GI bleed 06/21/2018   Allergy    Bilateral AVN of femurs ([place]) 06/25/2018   See 11/2017 Chest CT report   COPD 12/21/2006   Environmental allergies    History of femur fracture 06/25/2018   11/2017 CT AP showed Remote nonunited right femur greater trochanter fracture   History of Fracture of right tibia 06/26/2018   History of multiple right thoacic rib fractures 06/25/2018   History of Right clavicular fracture 06/25/2018   HYPERLIPIDEMIA 12/21/2006   HYPERTENSION, BENIGN SYSTEMIC 12/21/2006   Mallory-Weiss tear     TOBACCO DEPENDENCE 12/21/2006    Patient Active Problem List   Diagnosis Date Noted   Anxiety 12/31/2020   Neck pain 12/31/2020   COPD (chronic obstructive pulmonary disease) ([place]) 09/28/2020   Protein-calorie malnutrition, severe 09/09/2020   COPD with acute exacerbation ([place]) 09/07/2020   Malignant neoplasm of right upper lobe of lung ([place]) 05/26/2020   Lung mass 05/04/2020   Acute on chronic respiratory failure with hypoxia ([place]) 05/02/2020   Pulmonary nodule 02/10/2020   Pleural effusion, right    S/P CABG x 4 12/16/2019   Coronary artery disease 12/16/2019   Unstable angina ([place]) 12/12/2019   NSTEMI (non-ST elevated myocardial infarction) ([place]) 12/12/2019   CAD (coronary artery disease) 07/11/2018   H. pylori infection 07/03/2018   Chronic stable angina ([place]) 07/03/2018   Duodenal ulcer due to Helicobacter pylori    Bilateral AVN of femurs ([place]) 06/25/2018   Chronic cough 06/25/2018   Mallory-Weiss tear    Gastritis and gastroduodenitis    Cirrhosis of liver ([place])    Tubular adenoma of colon 01/24/2017   Epistaxis 01/01/2015   COPD exacerbation ([place]) 02/13/2013   Ganglion cyst 01/03/2012   PERIPHERAL NEUROPATHY 07/15/2010   Alcohol use disorder, severe, dependence ([place]) 05/14/2009   DEFORMITY, FOOT NEC, CONGENITAL 08/22/2007  ALLERGIC RHINITIS, SEASONAL 05/03/2007   Hyperlipidemia 12/21/2006   Tobacco abuse 12/21/2006   COPD with chronic bronchitis ([place]) 12/21/2006    Past Surgical History:  Procedure Laterality Date   ANKLE SURGERY     left ankle   APPLICATION OF WOUND VAC  01/06/2012   Procedure: APPLICATION OF WOUND VAC;  Surgeon: Meredith Pel, MD;  Location: [facility];  Service: Orthopedics;  Laterality: Left;   APPLICATION OF WOUND VAC Right 10/17/2015   Procedure: APPLICATION OF WOUND VAC;  Surgeon: Leandrew Koyanagi, MD;  Location: [place];  Service: Orthopedics;  Laterality: Right;   BIOPSY  06/22/2018   Procedure: BIOPSY;  Surgeon: Dorothea Ogle v, DO;   Location: [place] [facility];  Service: Gastroenterology;;   BRONCHIAL BIOPSY  05/04/2020   Procedure: BRONCHIAL BIOPSIES;  Surgeon: Garner Nash, DO;  Location: [place] [facility];  Service: Pulmonary;;   BRONCHIAL BRUSHINGS  05/04/2020   Procedure: BRONCHIAL BRUSHINGS;  Surgeon: Garner Nash, DO;  Location: [place] [facility];  Service: Pulmonary;;   BRONCHIAL NEEDLE ASPIRATION BIOPSY  05/04/2020   Procedure: BRONCHIAL NEEDLE ASPIRATION BIOPSIES;  Surgeon: Garner Nash, DO;  Location: [place];  Service: Pulmonary;;   BRONCHIAL WASHINGS  05/04/2020   Procedure: BRONCHIAL WASHINGS;  Surgeon: Garner Nash, DO;  Location: [place];  Service: Pulmonary;;   CORONARY ARTERY BYPASS GRAFT N/A 12/16/2019   Procedure: CORONARY ARTERY BYPASS GRAFTING (CABG) TIMES 4 USING LEFT INTERNAL MAMMARY ARTERY AND ENDOSCOPICALLY HARVESTED RIGHT SAPHENOUS VEIN;  Surgeon: Lajuana Matte, MD;  Location: [place];  Service: Open Heart Surgery;  Laterality: N/A;   ESOPHAGOGASTRODUODENOSCOPY (EGD) WITH PROPOFOL N/A 06/22/2018   Procedure: ESOPHAGOGASTRODUODENOSCOPY (EGD) WITH PROPOFOL;  Surgeon: [id number] v, DO;  Location: [place] [facility];  Service: Gastroenterology;  Laterality: N/A;   EXTERNAL FIXATION LEG  01/06/2012   Procedure: EXTERNAL FIXATION LEG;  Surgeon: Meredith Pel, MD;  [facility];  Service: Orthopedics;  Laterality: Left;  Smith-nephew external fixator set   EXTERNAL FIXATION LEG Right 09/16/2015   Procedure: EXTERNAL FIXATION LEG/LARGE;  Surgeon: Mcarthur Rossetti, MD;  Location: [place];  Service: Orthopedics;  Laterality: Right;   EXTERNAL FIXATION REMOVAL Right 09/22/2015   Procedure: REMOVAL EXTERNAL FIXATION LEG;  Surgeon: Mcarthur Rossetti, MD;  Location: [place];  Service: Orthopedics;  Laterality: Right;   FASCIOTOMY  01/10/2012   Procedure: FASCIOTOMY;  Surgeon: Rozanna Box, MD;  Location: [place];  Service: Orthopedics;  Laterality: Left;  status post fasciotomy with  wound vac left calf; adjustment external fixator left tibia under fluoro; retention suture/wound vac placement left lateral calf wound   FASCIOTOMY  01/06/2012   Procedure: FASCIOTOMY;  Surgeon: Meredith Pel, MD;  [facility];  Service: Orthopedics;  Laterality: Left;  Four compartment fasciotomy   FIDUCIAL MARKER PLACEMENT  05/04/2020   Procedure: FIDUCIAL MARKER PLACEMENT;  Surgeon: Garner Nash, DO;  Location: [place] [facility];  Service: Pulmonary;;   HOT HEMOSTASIS N/A 06/22/2018   Procedure: HEMOSTASIS;  Surgeon: Dorothea Ogle v, DO;  Location: [place];  Service: Gastroenterology;  Laterality: N/A;  Epinepherine and clip placed   I & D EXTREMITY  01/12/2012   Procedure: IRRIGATION AND DEBRIDEMENT EXTREMITY;  Surgeon: Rozanna Box, MD;  Location: [place];  Service: Orthopedics;  Laterality: Left;  LAYERD CLOSURE LEFT LEG WOUND   I & D EXTREMITY Right 10/17/2015   Procedure: IRRIGATION AND DEBRIDEMENT EXTREMITY;  Surgeon: Leandrew Koyanagi, MD;  Location: [place];  Service: Orthopedics;  Laterality: Right;   I & D  EXTREMITY Right 10/20/2015   Procedure: REPEAT IRRIGATION AND DEBRIDEMENT EXTREMITY, right lower leg;  Surgeon: Mcarthur Rossetti, MD;  Location: [place];  Service: Orthopedics;  Laterality: Right;   KNEE SURGERY     left knee and right knee   LEFT HEART CATH AND CORONARY ANGIOGRAPHY N/A 06/27/2018   Procedure: LEFT HEART CATH AND CORONARY ANGIOGRAPHY;  Surgeon: Martinique, Peter M, MD;  Location: [place] [facility];  Service: Cardiovascular;  Laterality: N/A;   LEFT HEART CATH AND CORONARY ANGIOGRAPHY N/A 12/12/2019   Procedure: LEFT HEART CATH AND CORONARY ANGIOGRAPHY;  Surgeon: Wellington Hampshire, MD;  Location: [place] [facility];  Service: Cardiovascular;  Laterality: N/A;   MASS EXCISION Left 09/22/2015   Procedure: EXCISION MASS;  Surgeon: Mcarthur Rossetti, MD;  Location: [place];  Service: Orthopedics;  Laterality: Left;   ORIF TIBIA PLATEAU  02/07/2012   Procedure:  OPEN REDUCTION INTERNAL FIXATION (ORIF) TIBIAL PLATEAU;  Surgeon: Rozanna Box, MD;  Location: [place];  Service: Orthopedics;  Laterality: Left;  ORIF LEFT TIBIAL PLATEAU/ REMOVE EXTERNAL FIXATION LEFT LEG   ORIF TIBIA PLATEAU Right 09/22/2015   Procedure: REMOVAL OF EXTERNAL FIXATOR RIGHT LEG, OPEN REDUCTION INTERNAL FIXATION (ORIF) RIGHT TIBIAL PLATEAU, EXCISION SMALL MASS LEFT LEG;  Surgeon: Mcarthur Rossetti, MD;  Location: [place];  Service: Orthopedics;  Laterality: Right;   SKIN SPLIT GRAFT Right 10/23/2015   Procedure: RIGHT GASTROC FLAP SPLIT THICKNESS SKIN GRAFT FROM RIGHT THING TO RIGHT LEG;  Surgeon: Irene Limbo, MD;  Location: [place];  Service: Plastics;  Laterality: Right;   TEE WITHOUT CARDIOVERSION N/A 12/16/2019   Procedure: TRANSESOPHAGEAL ECHOCARDIOGRAM (TEE);  Surgeon: Lajuana Matte, MD;  Location: [place];  Service: Open Heart Surgery;  Laterality: N/A;   VIDEO BRONCHOSCOPY WITH ENDOBRONCHIAL NAVIGATION Right 05/04/2020   Procedure: VIDEO BRONCHOSCOPY WITH ENDOBRONCHIAL NAVIGATION;  Surgeon: Garner Nash, DO;  Location: [place];  Service: Pulmonary;  Laterality: Right;   WRIST SURGERY     left       Family History  Problem Relation Age of Onset   Emphysema Father     Social History   Tobacco Use   Smoking status: Every Day    Packs/day: 0.50    Years: 51.00    Pack years: 25.50    Types: Cigarettes   Smokeless tobacco: Former    Types: Snuff, Chew    Quit date: 01/05/1965   Tobacco comments:    currently half a pack a day-05/26/2020  Vaping Use   Vaping Use: Never used  Substance Use Topics   Alcohol use: Yes    Alcohol/week: 6.0 standard drinks    Types: 6 Cans of beer per week    Comment: drinks about a 6-pack/day   Drug use: No    Home Medications Prior to Admission medications   Medication Sig Start Date End Date Taking? Authorizing Provider  albuterol (VENTOLIN HFA) 108 (90 Base) MCG/ACT inhaler INHALE 2 PUFFS BY MOUTH EVERY  4 HOURS AS NEEDED FOR WHEEZE OR FOR SHORTNESS OF BREATH Patient taking differently: Inhale 2 puffs into the lungs every 4 (four) hours as needed for wheezing or shortness of breath. 12/17/20  Yes Brimage, Vondra, DO  calcium carbonate (OS-CAL) 1250 (500 Ca) MG chewable tablet Chew 0.5 tablets by mouth daily.   Yes [provider]  diclofenac Sodium (VOLTAREN) 1 % GEL Apply 2 g topically 4 (four) times daily. 12/28/20  Yes Brimage, Vondra, DO  hydrOXYzine (ATARAX/VISTARIL) 10 MG tablet  TAKE 1 TABLET(10 MG) BY MOUTH EVERY 8 HOURS AS NEEDED FOR ANXIETY Patient taking differently: Take 10 mg by mouth every 8 (eight) hours as needed for anxiety. 03/10/21  Yes Brimage, Vondra, DO  ipratropium-albuterol (DUONEB) 0.5-2.5 (3) MG/3ML SOLN Take 3 mLs by nebulization every 6 (six) hours as needed. Patient taking differently: Take 3 mLs by nebulization every 6 (six) hours as needed (for wheezing and shortness of breath). 09/10/20  Yes Ghimire, Henreitta Leber, MD  metoprolol tartrate (LOPRESSOR) 25 MG tablet Take 1 tablet (25 mg total) by mouth 2 (two) times daily. 12/28/20  Yes Brimage, Vondra, DO  nitroGLYCERIN (NITROSTAT) 0.4 MG SL tablet PLACE 1 TABLET (0.4 MG TOTAL) UNDER THE TONGUE EVERY 5 (FIVE) MINUTES AS NEEDED FOR CHEST PAIN. 10/05/20  Yes Brimage, Vondra, DO  OXYGEN Inhale 3 L into the lungs See admin instructions. Continuous at night, as needed during the day   Yes [provider]  oxymetazoline (AFRIN) 0.05 % nasal spray Place 1 spray into both nostrils 2 (two) times daily as needed for congestion (nose bleeds).    Yes [provider]  umeclidinium-vilanterol (ANORO ELLIPTA) 62.5-25 MCG/INH AEPB TAKE 1 PUFF BY MOUTH EVERY DAY Patient taking differently: Inhale 1 puff into the lungs daily. 12/17/20  Yes Brimage, Vondra, DO  azithromycin (ZITHROMAX) 250 MG tablet Take 2 tablets daily for 3 days. Patient not taking: Reported on 04/08/2021 12/28/20   [name], DO  bisacodyl (BISACODYL)  5 MG EC tablet Take 15 mg by mouth daily as needed for moderate constipation.  Patient not taking: No sig reported    [provider]  famotidine (PEPCID) 20 MG tablet Take 1 tablet (20 mg total) by mouth 2 (two) times daily. Patient not taking: Reported on 04/08/2021 09/10/20   Jonetta Osgood, MD  fluticasone [facility]) 50 MCG/ACT nasal spray SPRAY 2 SPRAYS INTO EACH NOSTRIL EVERY DAY Patient not taking: Reported on 04/08/2021 07/19/18   Steve Rattler, DO  folic acid (FOLVITE) 1 MG tablet Take 1 tablet (1 mg total) by mouth daily. Patient not taking: Reported on 04/08/2021 09/10/20   Jonetta Osgood, MD  thiamine 100 MG tablet Take 1 tablet (100 mg total) by mouth daily. Patient not taking: No sig reported 09/10/20   Jonetta Osgood, MD  vitamin B-12 1000 MCG tablet Take 1 tablet (1,000 mcg total) by mouth daily. Patient not taking: Reported on 04/08/2021 10/02/20   Sharion Settler, DO  loratadine (CLARITIN) 10 MG tablet Take 1 tablet (10 mg total) by mouth daily. 03/04/11 05/23/18  Mariana Arn, MD    Allergies    Ace inhibitors, Ampicillin, Penicillins, Codeine, Lisinopril, and Aspirin  Review of Systems   Review of Systems Systems reviewed and negative except as per HPI Physical Exam Updated Vital Signs BP 132/76   Pulse 88   Temp 97.6 F (36.4 C) (Axillary)   Resp 19   SpO2 98%   Physical Exam Constitutional:      Comments: Patient is alert and nontoxic.  Very thin.  He is sitting up in the stretcher.  Very alert in appearance.  Breathing comfortably with BiPAP on.  Taking in full sentences through the BiPAP.  HENT:     Head: Normocephalic and atraumatic.     Mouth/Throat:     Pharynx: Oropharynx is clear.  Eyes:     Extraocular Movements: Extraocular movements intact.  Cardiovascular:     Rate and Rhythm: Normal rate and regular rhythm.  Pulmonary:  Comments: Very mild increased work of breathing currently on BiPAP.  Very soft breath sounds  throughout.  No gross rhonchi or crackle. Abdominal:     General: There is no distension.     Palpations: Abdomen is soft.     Tenderness: There is no abdominal tenderness.  Musculoskeletal:        General: No swelling or tenderness. Normal range of motion.     Right lower leg: No edema.     Left lower leg: No edema.  Skin:    General: Skin is warm and dry.  Neurological:     General: No focal deficit present.     Mental Status: He is oriented to person, place, and time.     Motor: No weakness.     Coordination: Coordination normal.    ED Results / Procedures / Treatments   Labs (all labs ordered are listed, but only abnormal results are displayed) Labs Reviewed  COMPREHENSIVE METABOLIC PANEL - Abnormal; Notable for the following components:      Result Value   Glucose, Bld 112 (*)    AST 61 (*)    All other components within normal limits  CBC WITH DIFFERENTIAL/PLATELET - Abnormal; Notable for the following components:   Lymphs Abs 0.4 (*)    All other components within normal limits  I-STAT VENOUS BLOOD GAS, ED - Abnormal; Notable for the following components:   pH, Ven 7.450 (*)    pO2, Ven 73.0 (*)    Bicarbonate 31.9 (*)    TCO2 33 (*)    Acid-Base Excess 7.0 (*)    Calcium, Ion 1.12 (*)    All other components within normal limits  TROPONIN I (HIGH SENSITIVITY) - Abnormal; Notable for the following components:   Troponin I (High Sensitivity) 20 (*)    All other components within normal limits  TROPONIN I (HIGH SENSITIVITY) - Abnormal; Notable for the following components:   Troponin I (High Sensitivity) 19 (*)    All other components within normal limits  RESP PANEL BY RT-PCR (FLU A&B, COVID) ARPGX2  BRAIN NATRIURETIC PEPTIDE  PROTIME-INR  LACTIC ACID, PLASMA  LACTIC ACID, PLASMA  URINALYSIS, ROUTINE W REFLEX MICROSCOPIC  BLOOD GAS, VENOUS    EKG EKG Interpretation  Date/Time:  Thursday April 08 2021 15:29:01 EDT Ventricular Rate:  94 PR  Interval:  169 QRS Duration: 88 QT Interval:  354 QTC Calculation: 443 R Axis:   89 Text Interpretation: Sinus rhythm Biatrial enlargement Borderline right axis deviation Left ventricular hypertrophy no change from previous Confirmed by Charlesetta Shanks [phone]) on 04/08/2021 8:41:53 PM  Radiology DG Chest Port 1 View  Result Date: 04/08/2021 CLINICAL DATA:  Short of breath EXAM: PORTABLE CHEST 1 VIEW COMPARISON:  09/28/2020, CT 10/29/2020 FINDINGS: Post sternotomy changes. Mild chronic elevation left diaphragm. Chronic pleural blunting the right base. Stable cardiomediastinal silhouette. No pneumothorax. Architectural distortion at the right apical lung with adjacent clips, corresponding to known lung nodule in the region and post treatment changes. Multiple old right rib fractures. IMPRESSION: No active disease. Architectural distortion in the right apical lung corresponding to known lung nodule and post treatment changes. Electronically Signed   By: Donavan Foil M.D.   On: 04/08/2021 19:40    Procedures Procedures   Medications Ordered in ED Medications  ipratropium-albuterol (DUONEB) 0.5-2.5 (3) MG/3ML nebulizer solution 3 mL (3 mLs Nebulization Given 04/08/21 1725)    ED Course  I have reviewed the triage vital signs and the nursing notes.  Pertinent labs &  imaging results that were available during my care of the patient were reviewed by me and considered in my medical decision making (see chart for details).    MDM Rules/Calculators/A&P                          Patient presented on a BiPAP from EMS.  By the time of my evaluation, patient was already showing significant improvement and was rapidly transitioned back to his baseline nasal cannula oxygen.  Patient had already smoked 4 cigarettes a day.  He attributed to his attack to extra heat in the house due to his wife cooking.  At this time no signs of acute ischemic event, pneumonia or persistent COPD exacerbation beyond patient's  baseline.  Vital signs are stable and patient is back to baseline, will discharge with strong instructions to quit smoking, use his nebulizers and inhalers and follow-up with his PCP or pulmonologist ASAP Final Clinical Impression(s) / ED Diagnoses Final diagnoses:  COPD exacerbation ([place])  Tobacco abuse    Rx / DC Orders ED Discharge Orders     None        Charlesetta Shanks, MD 04/08/21 2044

## 2021-04-12 DIAGNOSIS — Z5189 Encounter for other specified aftercare: Secondary | ICD-10-CM | POA: Diagnosis not present

## 2021-04-24 DIAGNOSIS — J90 Pleural effusion, not elsewhere classified: Secondary | ICD-10-CM | POA: Diagnosis not present

## 2021-04-24 DIAGNOSIS — J449 Chronic obstructive pulmonary disease, unspecified: Secondary | ICD-10-CM | POA: Diagnosis not present

## 2021-04-24 DIAGNOSIS — I2510 Atherosclerotic heart disease of native coronary artery without angina pectoris: Secondary | ICD-10-CM | POA: Diagnosis not present

## 2021-04-27 ENCOUNTER — Other Ambulatory Visit: Payer: Self-pay | Admitting: Family Medicine

## 2021-05-04 ENCOUNTER — Ambulatory Visit (HOSPITAL_COMMUNITY): Admission: RE | Admit: 2021-05-04 | Payer: Medicare Other | Source: Ambulatory Visit

## 2021-05-06 ENCOUNTER — Other Ambulatory Visit: Payer: Self-pay | Admitting: Radiation Oncology

## 2021-05-06 ENCOUNTER — Telehealth: Payer: Self-pay | Admitting: *Deleted

## 2021-05-06 NOTE — Telephone Encounter (Signed)
CALLED PATIENT TO INFORM OF STAT LAB ON 06-08-21 @ 2 PM @ [facility] CT TO FOLLOW ON 06-08-21 - ARRIVAL TIME- 2:45 PM @ [facility], PATIENT TO HAVE WATER ONLY - 4 HRS. PRIOR TO TEST, PATIENT TO RECEIVE RESULTS FROM ALISON PERKINS ON 06-09-21 @ 8:30 AM VIA TELEPHONE, LVM FOR A RETURN CALL

## 2021-05-10 ENCOUNTER — Ambulatory Visit: Payer: Medicare Other | Admitting: Radiation Oncology

## 2021-05-11 ENCOUNTER — Ambulatory Visit: Payer: Medicare Other

## 2021-05-11 ENCOUNTER — Ambulatory Visit (HOSPITAL_COMMUNITY): Payer: Medicare Other

## 2021-05-17 ENCOUNTER — Ambulatory Visit: Payer: Medicare Other | Admitting: Radiation Oncology

## 2021-05-25 DIAGNOSIS — I2510 Atherosclerotic heart disease of native coronary artery without angina pectoris: Secondary | ICD-10-CM | POA: Diagnosis not present

## 2021-05-25 DIAGNOSIS — J90 Pleural effusion, not elsewhere classified: Secondary | ICD-10-CM | POA: Diagnosis not present

## 2021-05-25 DIAGNOSIS — J449 Chronic obstructive pulmonary disease, unspecified: Secondary | ICD-10-CM | POA: Diagnosis not present

## 2021-05-26 ENCOUNTER — Other Ambulatory Visit: Payer: Self-pay | Admitting: Family Medicine

## 2021-05-26 DIAGNOSIS — F419 Anxiety disorder, unspecified: Secondary | ICD-10-CM

## 2021-06-07 ENCOUNTER — Encounter: Payer: Self-pay | Admitting: Radiation Oncology

## 2021-06-07 NOTE — Progress Notes (Signed)
Patient reports doing well. Denies, pain, shortness of breath, painful swallowing, choking, and weight loss. Patient states mild fatigue, cough (from copd), and a good appetite.  Meaningful use questions complete.  Patient notified of his telephone appointment at 8:30am on 06/09/21 and expressed an understanding of it.

## 2021-06-08 ENCOUNTER — Ambulatory Visit (HOSPITAL_COMMUNITY): Payer: Medicare Other

## 2021-06-08 ENCOUNTER — Ambulatory Visit: Payer: Medicare Other | Attending: Radiation Oncology

## 2021-06-08 ENCOUNTER — Telehealth: Payer: Self-pay | Admitting: *Deleted

## 2021-06-08 NOTE — Telephone Encounter (Signed)
CALLED PATIENT TO ASK ABOUT RESCHEDULING MISSED LAB AND CT, LVM FOR A RETURN CALL

## 2021-06-09 ENCOUNTER — Ambulatory Visit
Admission: RE | Admit: 2021-06-09 | Discharge: 2021-06-09 | Disposition: A | Discharge status: Home / Self Care | Payer: Medicare Other | Source: Ambulatory Visit | Attending: Radiation Oncology | Admitting: Radiation Oncology

## 2021-06-12 ENCOUNTER — Telehealth: Payer: Self-pay

## 2021-06-12 NOTE — Telephone Encounter (Signed)
LVM to have pt call back to schedule AWV.   RE: confirm insurance and schedule AWV on my schedule if times are convenient for patient or other AWV schedule as template permits.   

## 2021-06-19 NOTE — Progress Notes (Deleted)
Cardiology Office Note:    Date:  06/19/2021   ID:  Lucas [name], DOB Jan 30, 1951, MRN [id number]  PCP:  [name], DO  Cardiologist:  Zayra Devito Martinique, MD  Electrophysiologist:  None   Referring MD: [name], DO   No chief complaint on file.   History of Present Illness:    Lucas [name] is a 70 y.o. male with a hx of HTN, HLD, EtOH abuse, tobacco abuse, COPD and CAD.  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  He later presented to [facility] on 10/24/2017 with chest pain.  Cardiac catheterization at the time revealed 35% proximal RCA, 85% and 50% sequential lesion in mid RCA, 70% ostial left circumflex, 65% left main/proximal LAD, occluded D2.  The LAD and left circumflex lesion was not amenable to PCI and were not critical.  The mid RCA lesion was more severe however would require atherectomy and stenting.  Given ongoing alcohol abuse, Mallory-Weiss tear and medication noncompliance, it was decided to manage medically.  Patient had COVID-19 infection in October 2020.     More recently, patient was admitted to the hospital on 12/12/2019 with chest pain. He was ruled in for NSTEMI by enzymes.  Cardiac catheterization showed severe calcified three-vessel disease with 80% distal left main to proximal LAD, 100% occluded ostial D2, 85% ostial left circumflex lesion, 60% proximal RCA, 99% mid RCA, 95% proximal to mid left circumflex lesion, 60% ostial D1, EF 50 to 55%.  Echocardiogram obtained on the same day showed EF 50 to 55%, mild MR.  Patient eventually underwent CABG x4 by Dr. Kipp Brood on 12/16/2019 with LIMA-LAD, SVG-PDA, SVG-D1, and SVG-OM2.  Hemoglobin A1c prior to the surgery was 6.1 which placed the patient in the prediabetes range.  Postprocedure, Lopressor was increased to 37.5 mg twice daily.  Noted during the hospitalization, patient had CT angiogram of the chest that demonstrated new 11 mm spiculated nodule in the  right upper lobe.  Given the history of smoking, this likely represent primary bronchogenic carcinoma.   Patient has since had several ED evaluations for epistaxis. He was admitted in July with COPD exacerbation treated with inhalers, steroids and antibiotics. CT showed enlargement of RU lobe spiculated mass. Bronchoscopy was done with path positive for squamous cell CA. Not felt to be a surgical candidate due to ongoing smoking. Referred to oncology and treated with SBRT.   Admitted in Nov 2021 with respiratory failure requiring intubation due to COPD exacerbation. Admitted again in Dec with similar problems but avoided intubation.  Followed primarily by [facility] medicine service.     Past Medical History:  Diagnosis Date   Acute blood loss anemia 06/21/2018   Acute upper GI bleed 06/21/2018   Allergy    Bilateral AVN of femurs ([place]) 06/25/2018   See 11/2017 Chest CT report   COPD 12/21/2006   Environmental allergies    History of femur fracture 06/25/2018   11/2017 CT AP showed Remote nonunited right femur greater trochanter fracture   History of Fracture of right tibia 06/26/2018   History of multiple right thoacic rib fractures 06/25/2018   History of Right clavicular fracture 06/25/2018   HYPERLIPIDEMIA 12/21/2006   HYPERTENSION, BENIGN SYSTEMIC 12/21/2006   Mallory-Weiss tear    TOBACCO DEPENDENCE 12/21/2006    Past Surgical History:  Procedure Laterality Date   ANKLE SURGERY     left ankle   APPLICATION OF WOUND VAC  01/06/2012  Procedure: APPLICATION OF WOUND VAC;  Surgeon: Meredith Pel, MD;  Location: [facility];  Service: Orthopedics;  Laterality: Left;   APPLICATION OF WOUND VAC Right 10/17/2015   Procedure: APPLICATION OF WOUND VAC;  Surgeon: Leandrew Koyanagi, MD;  Location: [place];  Service: Orthopedics;  Laterality: Right;   BIOPSY  06/22/2018   Procedure: BIOPSY;  Surgeon: Dorothea Ogle v, DO;  Location: [place] [facility];  Service: Gastroenterology;;   BRONCHIAL BIOPSY  05/04/2020    Procedure: BRONCHIAL BIOPSIES;  Surgeon: Garner Nash, DO;  Location: [place] [facility];  Service: Pulmonary;;   BRONCHIAL BRUSHINGS  05/04/2020   Procedure: BRONCHIAL BRUSHINGS;  Surgeon: Garner Nash, DO;  Location: [place] [facility];  Service: Pulmonary;;   BRONCHIAL NEEDLE ASPIRATION BIOPSY  05/04/2020   Procedure: BRONCHIAL NEEDLE ASPIRATION BIOPSIES;  Surgeon: Garner Nash, DO;  Location: [place];  Service: Pulmonary;;   BRONCHIAL WASHINGS  05/04/2020   Procedure: BRONCHIAL WASHINGS;  Surgeon: Garner Nash, DO;  Location: [place];  Service: Pulmonary;;   CORONARY ARTERY BYPASS GRAFT N/A 12/16/2019   Procedure: CORONARY ARTERY BYPASS GRAFTING (CABG) TIMES 4 USING LEFT INTERNAL MAMMARY ARTERY AND ENDOSCOPICALLY HARVESTED RIGHT SAPHENOUS VEIN;  Surgeon: Lajuana Matte, MD;  Location: [place];  Service: Open Heart Surgery;  Laterality: N/A;   ESOPHAGOGASTRODUODENOSCOPY (EGD) WITH PROPOFOL N/A 06/22/2018   Procedure: ESOPHAGOGASTRODUODENOSCOPY (EGD) WITH PROPOFOL;  Surgeon: [id number] v, DO;  Location: [place] [facility];  Service: Gastroenterology;  Laterality: N/A;   EXTERNAL FIXATION LEG  01/06/2012   Procedure: EXTERNAL FIXATION LEG;  Surgeon: Meredith Pel, MD;  [facility]: [facility];  Service: Orthopedics;  Laterality: Left;  Smith-nephew external fixator set   EXTERNAL FIXATION LEG Right 09/16/2015   Procedure: EXTERNAL FIXATION LEG/LARGE;  Surgeon: Mcarthur Rossetti, MD;  Location: [place];  Service: Orthopedics;  Laterality: Right;   EXTERNAL FIXATION REMOVAL Right 09/22/2015   Procedure: REMOVAL EXTERNAL FIXATION LEG;  Surgeon: Mcarthur Rossetti, MD;  Location: [place];  Service: Orthopedics;  Laterality: Right;   FASCIOTOMY  01/10/2012   Procedure: FASCIOTOMY;  Surgeon: Rozanna Box, MD;  Location: [place];  Service: Orthopedics;  Laterality: Left;  status post fasciotomy with wound vac left calf; adjustment external fixator left tibia under fluoro; retention  suture/wound vac placement left lateral calf wound   FASCIOTOMY  01/06/2012   Procedure: FASCIOTOMY;  Surgeon: Meredith Pel, MD;  [facility];  Service: Orthopedics;  Laterality: Left;  Four compartment fasciotomy   FIDUCIAL MARKER PLACEMENT  05/04/2020   Procedure: FIDUCIAL MARKER PLACEMENT;  Surgeon: Garner Nash, DO;  Location: [place] [facility];  Service: Pulmonary;;   HOT HEMOSTASIS N/A 06/22/2018   Procedure: HEMOSTASIS;  Surgeon: Dorothea Ogle v, DO;  Location: [place];  Service: Gastroenterology;  Laterality: N/A;  Epinepherine and clip placed   I & D EXTREMITY  01/12/2012   Procedure: IRRIGATION AND DEBRIDEMENT EXTREMITY;  Surgeon: Rozanna Box, MD;  Location: [place];  Service: Orthopedics;  Laterality: Left;  LAYERD CLOSURE LEFT LEG WOUND   I & D EXTREMITY Right 10/17/2015   Procedure: IRRIGATION AND DEBRIDEMENT EXTREMITY;  Surgeon: Leandrew Koyanagi, MD;  Location: [place];  Service: Orthopedics;  Laterality: Right;   I & D EXTREMITY Right 10/20/2015   Procedure: REPEAT IRRIGATION AND DEBRIDEMENT EXTREMITY, right lower leg;  Surgeon: Mcarthur Rossetti, MD;  Location: [place];  Service: Orthopedics;  Laterality: Right;   KNEE SURGERY     left knee and right knee   LEFT HEART CATH AND CORONARY ANGIOGRAPHY  N/A 06/27/2018   Procedure: LEFT HEART CATH AND CORONARY ANGIOGRAPHY;  Surgeon: Martinique, Charleston Hankin M, MD;  Location: [place] [facility];  Service: Cardiovascular;  Laterality: N/A;   LEFT HEART CATH AND CORONARY ANGIOGRAPHY N/A 12/12/2019   Procedure: LEFT HEART CATH AND CORONARY ANGIOGRAPHY;  Surgeon: [place] Hampshire, MD;  Location: [place] [facility];  Service: Cardiovascular;  Laterality: N/A;   MASS EXCISION Left 09/22/2015   Procedure: EXCISION MASS;  Surgeon: Mcarthur Rossetti, MD;  Location: [place];  Service: Orthopedics;  Laterality: Left;   ORIF TIBIA PLATEAU  02/07/2012   Procedure: OPEN REDUCTION INTERNAL FIXATION (ORIF) TIBIAL PLATEAU;  Surgeon: Rozanna Box,  MD;  Location: [place];  Service: Orthopedics;  Laterality: Left;  ORIF LEFT TIBIAL PLATEAU/ REMOVE EXTERNAL FIXATION LEFT LEG   ORIF TIBIA PLATEAU Right 09/22/2015   Procedure: REMOVAL OF EXTERNAL FIXATOR RIGHT LEG, OPEN REDUCTION INTERNAL FIXATION (ORIF) RIGHT TIBIAL PLATEAU, EXCISION SMALL MASS LEFT LEG;  Surgeon: Mcarthur Rossetti, MD;  Location: [place];  Service: Orthopedics;  Laterality: Right;   SKIN SPLIT GRAFT Right 10/23/2015   Procedure: RIGHT GASTROC FLAP SPLIT THICKNESS SKIN GRAFT FROM RIGHT THING TO RIGHT LEG;  Surgeon: Irene Limbo, MD;  Location: [place];  Service: Plastics;  Laterality: Right;   TEE WITHOUT CARDIOVERSION N/A 12/16/2019   Procedure: TRANSESOPHAGEAL ECHOCARDIOGRAM (TEE);  Surgeon: Lajuana Matte, MD;  Location: [place];  Service: Open Heart Surgery;  Laterality: N/A;   VIDEO BRONCHOSCOPY WITH ENDOBRONCHIAL NAVIGATION Right 05/04/2020   Procedure: VIDEO BRONCHOSCOPY WITH ENDOBRONCHIAL NAVIGATION;  Surgeon: Garner Nash, DO;  Location: [place];  Service: Pulmonary;  Laterality: Right;   WRIST SURGERY     left    Current Medications: No outpatient medications have been marked as taking for the 06/22/21 encounter (Appointment) with Martinique, Fatimata Talsma M, MD.     Allergies:   Ace inhibitors, Ampicillin, Penicillins, Codeine, Lisinopril, and Aspirin   Social History   Socioeconomic History   Marital status: Married    Spouse name: Not on file   Number of children: 2   Years of education: Not on file   Highest education level: Not on file  Occupational History   Not on file  Tobacco Use   Smoking status: Every Day    Packs/day: 0.50    Years: 51.00    Pack years: 25.50    Types: Cigarettes   Smokeless tobacco: Former    Types: Snuff, Chew    Quit date: 01/05/1965   Tobacco comments:    currently half a pack a day-05/26/2020  Vaping Use   Vaping Use: Never used  Substance and Sexual Activity   Alcohol use: Yes    Alcohol/week: 6.0 standard  drinks    Types: 6 Cans of beer per week    Comment: drinks about a 6-pack/day   Drug use: No   Sexual activity: Yes    Partners: Female  Other Topics Concern   Not on file  Social History Narrative   Not on file   Social Determinants of Health   Financial Resource Strain: Not on file  Food Insecurity: Not on file  Transportation Needs: Not on file  Physical Activity: Not on file  Stress: Not on file  Social Connections: Not on file     Family History: The patient's family history includes Emphysema in his father.  ROS:   Please see the history of present illness.     All other systems reviewed and are negative.  EKGs/Labs/Other Studies Reviewed:    The following studies were reviewed today:  Cath 12/12/2019 Dist LM to Prox LAD lesion is 80% stenosed. Mid LM to Dist LM lesion is 50% stenosed. Ost 2nd Diag lesion is 100% stenosed. Ost Cx to Prox Cx lesion is 85% stenosed. Prox RCA lesion is 60% stenosed. Mid RCA-1 lesion is 99% stenosed. Mid RCA-2 lesion is 50% stenosed. The left ventricular systolic function is normal. LV end diastolic pressure is mildly elevated. The left ventricular ejection fraction is 50-55% by visual estimate. Ost LM lesion is 40% stenosed. Prox Cx to Mid Cx lesion is 95% stenosed. Ost 1st Diag lesion is 60% stenosed.   1.  Severe heavily calcified three-vessel coronary artery disease. 2.  Normal LV systolic function and mildly elevated left ventricular end-diastolic pressure.   Recommendations: The coronary arteries are not suitable for PCI due to heavy diffuse calcifications.  Recommend evaluation for CABG especially that the patient has right lung mass which will likely require resection at the same time.  The patient is also a poor candidate for dual antiplatelet therapy.  There are good distal targets for CABG. Also recommend oncology consultation to ensure he does not have metastatic disease before committing to CABG.     Echo  12/12/2019 1. Left ventricular ejection fraction, by estimation, is 50 to 55%. The  left ventricle has low normal function. The left ventricle demonstrates  regional wall motion abnormalities (see scoring diagram/findings for  description). Left ventricular diastolic   parameters are indeterminate.   2. Right ventricular systolic function is mildly reduced. The right  ventricular size is normal. There is normal pulmonary artery systolic  pressure. The estimated right ventricular systolic pressure is 31.4 mmHg.   3. The mitral valve is grossly normal. Mild mitral valve regurgitation.   4. The aortic valve is tricuspid. There is moderate, nodular  calcification of the left coronary cusp as well as moderate annular  calcification. Aortic valve regurgitation is not visualized.   5. The inferior vena cava is normal in size with greater than 50%  respiratory variability, suggesting right atrial pressure of 3 mmHg.     EKG:  EKG is not ordered today.    Recent Labs: 09/28/2020: Magnesium 2.8 04/08/2021: ALT 42; B Natriuretic Peptide 46.6; BUN 9; Creatinine, Ser 0.72; Hemoglobin 13.6; Platelets 157; Potassium 4.0; Sodium 138  Recent Lipid Panel    Component Value Date/Time   CHOL 168 12/15/2019 0315   TRIG 57 09/07/2020 0442   HDL 75 12/15/2019 0315   CHOLHDL 2.2 12/15/2019 0315   VLDL 6 12/15/2019 0315   LDLCALC 87 12/15/2019 0315   LDLDIRECT 105 (H) 05/16/2017 1528   LDLDIRECT 150 (H) 02/13/2013 1502    Physical Exam:    VS:  There were no vitals taken for this visit.    Wt Readings from Last 3 Encounters:  12/28/20 133 lb 6 oz (60.5 kg)  10/07/20 135 lb 6.4 oz (61.4 kg)  09/29/20 130 lb (59 kg)     GEN:  Well nourished, thin BM  in no acute distress HEENT: Normal NECK: No JVD; No carotid bruits LYMPHATICS: No lymphadenopathy CARDIAC: RRR, no murmurs, rubs, gallops RESPIRATORY:  Clear to auscultation without rales, wheezing or rhonchi  ABDOMEN: Soft, non-tender,  non-distended MUSCULOSKELETAL:  No edema; No deformity  SKIN: Warm and dry NEUROLOGIC:  Alert and oriented x 3 PSYCHIATRIC:  Normal affect   ASSESSMENT:    No diagnosis found.  PLAN:    In order of problems  listed above:  CAD s/p CABG in February: He is doing well from a cardiac standpoint. No significant angina. On ASA 81 mg daily. Continue metoprolol and statin.  Hypertension: Blood pressure stable on current therapy  Hyperlipidemia: Continue Crestor patient has 40 mg and 20 mg listed on medication list. I asked him to verify what dose he is taking at home.  COPD: recent  acute exacerbation  Tobacco abuse: Although he quit smoking temporarily after bypass surgery, he has since picked up tobacco again.  I urged him to consider tobacco cessation   Prediabetes: Managed by primary care provider.  Squamous cell CA RUL Not a surgical candidate. Treated with SBRT   Medication Adjustments/Labs and Tests Ordered: Current medicines are reviewed at length with the patient today.  Concerns regarding medicines are outlined above.  No orders of the defined types were placed in this encounter.  No orders of the defined types were placed in this encounter.   There are no Patient Instructions on file for this visit.   Signed, Chalice Philbert Martinique, MD  06/19/2021 4:32 PM    [facility]

## 2021-06-22 ENCOUNTER — Ambulatory Visit: Payer: Medicare Other | Admitting: Cardiology

## 2021-06-25 DIAGNOSIS — I2510 Atherosclerotic heart disease of native coronary artery without angina pectoris: Secondary | ICD-10-CM | POA: Diagnosis not present

## 2021-06-25 DIAGNOSIS — J449 Chronic obstructive pulmonary disease, unspecified: Secondary | ICD-10-CM | POA: Diagnosis not present

## 2021-06-25 DIAGNOSIS — J90 Pleural effusion, not elsewhere classified: Secondary | ICD-10-CM | POA: Diagnosis not present

## 2021-07-20 ENCOUNTER — Telehealth: Payer: Self-pay | Admitting: *Deleted

## 2021-07-20 NOTE — Telephone Encounter (Signed)
CALLED PATIENT TO ASK ABOUT RESCHEDULING MISSED SCAN, LVM FOR A RETURN CALL

## 2021-07-25 DIAGNOSIS — J449 Chronic obstructive pulmonary disease, unspecified: Secondary | ICD-10-CM | POA: Diagnosis not present

## 2021-07-25 DIAGNOSIS — I2510 Atherosclerotic heart disease of native coronary artery without angina pectoris: Secondary | ICD-10-CM | POA: Diagnosis not present

## 2021-07-25 DIAGNOSIS — J90 Pleural effusion, not elsewhere classified: Secondary | ICD-10-CM | POA: Diagnosis not present

## 2021-08-02 ENCOUNTER — Other Ambulatory Visit: Payer: Self-pay | Admitting: Family Medicine

## 2021-08-02 DIAGNOSIS — J449 Chronic obstructive pulmonary disease, unspecified: Secondary | ICD-10-CM

## 2021-08-18 ENCOUNTER — Telehealth: Payer: Self-pay | Admitting: *Deleted

## 2021-08-18 NOTE — Telephone Encounter (Signed)
Called patient to inform of Stat labs on 08-24-21 @ 2:30 pm @ [place], and his CT - arrival time- 3:15 pm @ [facility], patient to have water only- 4 hrs. prior to test, spoke with patient and he verified understanding these appts.

## 2021-08-23 ENCOUNTER — Other Ambulatory Visit: Payer: Self-pay | Admitting: Family Medicine

## 2021-08-23 DIAGNOSIS — I208 Other forms of angina pectoris: Secondary | ICD-10-CM

## 2021-08-24 ENCOUNTER — Ambulatory Visit
Admission: RE | Admit: 2021-08-24 | Discharge: 2021-08-24 | Disposition: A | Discharge status: Home / Self Care | Payer: Medicare Other | Source: Ambulatory Visit | Attending: Radiation Oncology | Admitting: Radiation Oncology

## 2021-08-24 ENCOUNTER — Other Ambulatory Visit: Payer: Self-pay | Admitting: Radiation Oncology

## 2021-08-24 ENCOUNTER — Ambulatory Visit (HOSPITAL_COMMUNITY)
Admission: RE | Admit: 2021-08-24 | Discharge: 2021-08-24 | Disposition: A | Discharge status: Home / Self Care | Payer: Medicare Other | Source: Ambulatory Visit | Attending: Radiation Oncology | Admitting: Radiation Oncology

## 2021-08-24 ENCOUNTER — Encounter: Payer: Self-pay | Admitting: Radiation Oncology

## 2021-08-24 ENCOUNTER — Other Ambulatory Visit: Payer: Self-pay

## 2021-08-24 DIAGNOSIS — C3490 Malignant neoplasm of unspecified part of unspecified bronchus or lung: Secondary | ICD-10-CM | POA: Diagnosis not present

## 2021-08-24 DIAGNOSIS — C3411 Malignant neoplasm of upper lobe, right bronchus or lung: Secondary | ICD-10-CM | POA: Insufficient documentation

## 2021-08-24 DIAGNOSIS — I700 Atherosclerosis of aorta: Secondary | ICD-10-CM | POA: Diagnosis not present

## 2021-08-24 DIAGNOSIS — J439 Emphysema, unspecified: Secondary | ICD-10-CM | POA: Diagnosis not present

## 2021-08-24 LAB — BUN & CREATININE (CHCC)
BUN: 12 mg/dL (ref 8–23)
Creatinine: 0.77 mg/dL (ref 0.61–1.24)
GFR, Estimated: >60 mL/min (ref >60)

## 2021-08-24 MED ORDER — IOHEXOL 350 MG/ML SOLN: 60.0000 mL | Freq: Once | INTRAVENOUS | Status: DC | PRN

## 2021-08-24 NOTE — Progress Notes (Signed)
I was notified by radiology that the patient may have bedbugs. I discussed with them the protocol to catch a bug in a specimen cup, call environmental services, and infection prevention. He will need someone else to help transport him home as he relies on transportation through the cancer center. I also coordinated with the AD of nursing here at the cancer center as the patient was here in the cancer center lab today.

## 2021-08-25 ENCOUNTER — Encounter: Payer: Self-pay | Admitting: Radiation Oncology

## 2021-08-25 ENCOUNTER — Encounter: Payer: Self-pay | Admitting: General Practice

## 2021-08-25 DIAGNOSIS — I2510 Atherosclerotic heart disease of native coronary artery without angina pectoris: Secondary | ICD-10-CM | POA: Diagnosis not present

## 2021-08-25 DIAGNOSIS — J449 Chronic obstructive pulmonary disease, unspecified: Secondary | ICD-10-CM | POA: Diagnosis not present

## 2021-08-25 DIAGNOSIS — J90 Pleural effusion, not elsewhere classified: Secondary | ICD-10-CM | POA: Diagnosis not present

## 2021-08-25 NOTE — Progress Notes (Signed)
[place] Psychosocial Distress Screening Clinical Social Work  Clinical Social Work was referred by distress screening protocol.  The patient scored a 5 on the Psychosocial Distress Thermometer which indicates moderate distress. Clinical Social Worker contacted patient by phone to assess for distress and other psychosocial needs. He is well supported at home, no needs at this time.  He uses [employer] for his rides to/from [employer].  Briefly described [place] and encouraged him to reach out if needs arise in future related to his treatment here at [facility].    ONCBCN DISTRESS SCREENING 08/25/2021  Screening Type   Distress experienced in past week (1-10) 5  Emotional problem type Nervousness/Anxiety;Adjusting to illness  Information Concerns Type     Clinical Social Worker follow up needed: No.  If yes, follow up plan:  Beverely Pace, [place], LCSW Clinical Social Worker Phone:  [phone]

## 2021-08-25 NOTE — Progress Notes (Signed)
Patient states doing well. No symptoms reported at this time.  Meaningful use complete.  Patient notified of 08/26/21 -2:30pm telephone appointment and verbalized understanding.

## 2021-08-26 ENCOUNTER — Other Ambulatory Visit: Payer: Self-pay

## 2021-08-26 ENCOUNTER — Other Ambulatory Visit: Payer: Self-pay | Admitting: Radiation Oncology

## 2021-08-26 ENCOUNTER — Ambulatory Visit
Admission: RE | Admit: 2021-08-26 | Discharge: 2021-08-26 | Disposition: A | Discharge status: Home / Self Care | Payer: Medicare Other | Source: Ambulatory Visit | Attending: Radiation Oncology | Admitting: Radiation Oncology

## 2021-08-26 DIAGNOSIS — Z08 Encounter for follow-up examination after completed treatment for malignant neoplasm: Secondary | ICD-10-CM | POA: Diagnosis not present

## 2021-08-26 DIAGNOSIS — C3411 Malignant neoplasm of upper lobe, right bronchus or lung: Secondary | ICD-10-CM | POA: Diagnosis not present

## 2021-08-26 NOTE — Progress Notes (Signed)
Radiation Oncology         [phone] ________________________________  Outpatient Follow Up - Conducted via telephone due to current COVID-19 concerns for limiting patient exposure  I spoke with the patient to conduct this consult visit via telephone to spare the patient unnecessary potential exposure in the healthcare setting during the current COVID-19 pandemic. The patient was notified in advance and was offered a [place] meeting to allow for face to face communication but unfortunately reported that they did not have the appropriate resources/technology to support such a visit and instead preferred to proceed with a telephone visit.   Name: Lucas [name]        MRN: [id number]  Date of Service: 08/26/2021 DOB: 1951/03/14  BM:[id number], Ronnette Juniper, DO  Brimage, [place], DO     REFERRING PHYSICIAN: [name], DO   DIAGNOSIS: The encounter diagnosis was Malignant neoplasm of right upper lobe of lung ([place]).   HISTORY OF PRESENT ILLNESS: Lucas [name] is a 70 y.o. male with a Stage IA2, cT1bN0M0, NSCLC, Squamous Cell Carcinoma of the RUL diagonsed with bronchoscopy on 05/04/2020, atypical cells were seen in the cytology, and squamous cell carcinoma was confirmed by biopsy of the lesion.  He was not a good surgical candidate medically per Dr. Valeta Harms, so at the time of his bronchoscopy fiducial markers were placed.  He subsequenty underwent stereotactic body radiotherapy (SBRT) in August 2021. He has been followed expectantly in surveillance.   He underwent CT imaging on 08/24/2021, he had a difficult IV attempt so this was a scan without contrast.  Within the right upper lobe there was progressive masslike architectural distortion with central fiducial markers indicating site of the treated tumor without evidence of measurable nodularity or solid component.  No other new lung nodules were identified.  In chronic stigmata of atherosclerotic disease and emphysematous disease is again seen.  He is  contacted by phone to review these results.  On review of systems, the patient reports he continues to use his oxygen between 2-3L continuously.  He denies any new difficulty with breathing, and states that he had to delay his scan because of not feeling well at the times of his prior appointments.  No other complaints are verbalized.  When I asked him about the bedbugs, he states that he has not seen any of these and was attributing this to stay at his niece's house.  He is unsure if she has a known history of bedbugs.  I encouraged him as well to try to reach out to his Melburn Popper driver who took him to his appointments because he states that he did not notify that driver that he was told he had bedbugs.    PREVIOUS RADIATION THERAPY:   06/11/20 - 06/18/20 SBRT Treatment: The patient was treated to the right upper lobe lung with a course of stereotactic body radiation treatment.  The patient received 54 Gray in 3 fractions using a SBRT/IMRT technique, with 3 fields.    PAST MEDICAL HISTORY:  Past Medical History:  Diagnosis Date   Acute blood loss anemia 06/21/2018   Acute upper GI bleed 06/21/2018   Allergy    Bilateral AVN of femurs ([place]) 06/25/2018   See 11/2017 Chest CT report   COPD 12/21/2006   Environmental allergies    History of femur fracture 06/25/2018   11/2017 CT AP showed Remote nonunited right femur greater trochanter fracture   History of Fracture of right tibia 06/26/2018   History of multiple right thoacic rib  fractures 06/25/2018   History of Right clavicular fracture 06/25/2018   HYPERLIPIDEMIA 12/21/2006   HYPERTENSION, BENIGN SYSTEMIC 12/21/2006   Mallory-Weiss tear    TOBACCO DEPENDENCE 12/21/2006       PAST SURGICAL HISTORY: Past Surgical History:  Procedure Laterality Date   ANKLE SURGERY     left ankle   APPLICATION OF WOUND VAC  01/06/2012   Procedure: APPLICATION OF WOUND VAC;  Surgeon: Meredith Pel, MD;  Location: [facility];  Service: Orthopedics;  Laterality: Left;    APPLICATION OF WOUND VAC Right 10/17/2015   Procedure: APPLICATION OF WOUND VAC;  Surgeon: Leandrew Koyanagi, MD;  Location: [facility];  Service: Orthopedics;  Laterality: Right;   BIOPSY  06/22/2018   Procedure: BIOPSY;  Surgeon: Dorothea Ogle v, DO;  Location: [facility];  Service: Gastroenterology;;   BRONCHIAL BIOPSY  05/04/2020   Procedure: BRONCHIAL BIOPSIES;  Surgeon: Garner Nash, DO;  Location: [facility];  Service: Pulmonary;;   BRONCHIAL BRUSHINGS  05/04/2020   Procedure: BRONCHIAL BRUSHINGS;  Surgeon: Garner Nash, DO;  Location: [facility];  Service: Pulmonary;;   BRONCHIAL NEEDLE ASPIRATION BIOPSY  05/04/2020   Procedure: BRONCHIAL NEEDLE ASPIRATION BIOPSIES;  Surgeon: Garner Nash, DO;  Location: [facility] [place];  Service: Pulmonary;;   BRONCHIAL WASHINGS  05/04/2020   Procedure: BRONCHIAL WASHINGS;  Surgeon: Garner Nash, DO;  Location: [facility];  Service: Pulmonary;;   CORONARY ARTERY BYPASS GRAFT N/A 12/16/2019   Procedure: CORONARY ARTERY BYPASS GRAFTING (CABG) TIMES 4 USING LEFT INTERNAL MAMMARY ARTERY AND ENDOSCOPICALLY HARVESTED RIGHT SAPHENOUS VEIN;  Surgeon: Lajuana Matte, MD;  Location: [facility];  Service: Open Heart Surgery;  Laterality: N/A;   ESOPHAGOGASTRODUODENOSCOPY (EGD) WITH PROPOFOL N/A 06/22/2018   Procedure: ESOPHAGOGASTRODUODENOSCOPY (EGD) WITH PROPOFOL;  Surgeon: [id number] v, DO;  Location: [facility];  Service: Gastroenterology;  Laterality: N/A;   EXTERNAL FIXATION LEG  01/06/2012   Procedure: EXTERNAL FIXATION LEG;  Surgeon: Meredith Pel, MD;  [facility];  Service: Orthopedics;  Laterality: Left;  Smith-nephew external fixator set   EXTERNAL FIXATION LEG Right 09/16/2015   Procedure: EXTERNAL FIXATION LEG/LARGE;  Surgeon: Mcarthur Rossetti, MD;  Location: [place];  Service: Orthopedics;  Laterality: Right;   EXTERNAL FIXATION REMOVAL Right 09/22/2015   Procedure: REMOVAL EXTERNAL FIXATION LEG;  Surgeon: Mcarthur Rossetti, MD;  Location: [place];  Service: Orthopedics;  Laterality: Right;   FASCIOTOMY  01/10/2012   Procedure: FASCIOTOMY;  Surgeon: Rozanna Box, MD;  Location: [place];  Service: Orthopedics;  Laterality: Left;  status post fasciotomy with wound vac left calf; adjustment external fixator left tibia under fluoro; retention suture/wound vac placement left lateral calf wound   FASCIOTOMY  01/06/2012   Procedure: FASCIOTOMY;  Surgeon: Meredith Pel, MD;  [facility];  Service: Orthopedics;  Laterality: Left;  Four compartment fasciotomy   FIDUCIAL MARKER PLACEMENT  05/04/2020   Procedure: FIDUCIAL MARKER PLACEMENT;  Surgeon: Garner Nash, DO;  Location: [facility];  Service: Pulmonary;;   HOT HEMOSTASIS N/A 06/22/2018   Procedure: HEMOSTASIS;  Surgeon: Dorothea Ogle v, DO;  Location: [place];  Service: Gastroenterology;  Laterality: N/A;  Epinepherine and clip placed   I & D EXTREMITY  01/12/2012   Procedure: IRRIGATION AND DEBRIDEMENT EXTREMITY;  Surgeon: Rozanna Box, MD;  Location: [place];  Service: Orthopedics;  Laterality: Left;  LAYERD CLOSURE LEFT LEG WOUND   I & D EXTREMITY Right 10/17/2015   Procedure: IRRIGATION AND DEBRIDEMENT EXTREMITY;  Surgeon: Leandrew Koyanagi, MD;  Location: [facility];  Service: Orthopedics;  Laterality: Right;   I & D EXTREMITY Right 10/20/2015   Procedure: REPEAT IRRIGATION AND DEBRIDEMENT EXTREMITY, right lower leg;  Surgeon: Mcarthur Rossetti, MD;  Location: [place];  Service: Orthopedics;  Laterality: Right;   KNEE SURGERY     left knee and right knee   LEFT HEART CATH AND CORONARY ANGIOGRAPHY N/A 06/27/2018   Procedure: LEFT HEART CATH AND CORONARY ANGIOGRAPHY;  Surgeon: Martinique, Peter M, MD;  Location: [facility];  Service: Cardiovascular;  Laterality: N/A;   LEFT HEART CATH AND CORONARY ANGIOGRAPHY N/A 12/12/2019   Procedure: LEFT HEART CATH AND CORONARY ANGIOGRAPHY;  Surgeon: Wellington Hampshire, MD;  Location: [facility];   Service: Cardiovascular;  Laterality: N/A;   MASS EXCISION Left 09/22/2015   Procedure: EXCISION MASS;  Surgeon: Mcarthur Rossetti, MD;  Location: [place];  Service: Orthopedics;  Laterality: Left;   ORIF TIBIA PLATEAU  02/07/2012   Procedure: OPEN REDUCTION INTERNAL FIXATION (ORIF) TIBIAL PLATEAU;  Surgeon: Rozanna Box, MD;  Location: [facility];  Service: Orthopedics;  Laterality: Left;  ORIF LEFT TIBIAL PLATEAU/ REMOVE EXTERNAL FIXATION LEFT LEG   ORIF TIBIA PLATEAU Right 09/22/2015   Procedure: REMOVAL OF EXTERNAL FIXATOR RIGHT LEG, OPEN REDUCTION INTERNAL FIXATION (ORIF) RIGHT TIBIAL PLATEAU, EXCISION SMALL MASS LEFT LEG;  Surgeon: Mcarthur Rossetti, MD;  Location: [facility];  Service: Orthopedics;  Laterality: Right;   SKIN SPLIT GRAFT Right 10/23/2015   Procedure: RIGHT GASTROC FLAP SPLIT THICKNESS SKIN GRAFT FROM RIGHT THING TO RIGHT LEG;  Surgeon: Irene Limbo, MD;  Location: [facility];  Service: Plastics;  Laterality: Right;   TEE WITHOUT CARDIOVERSION N/A 12/16/2019   Procedure: TRANSESOPHAGEAL ECHOCARDIOGRAM (TEE);  Surgeon: Lajuana Matte, MD;  Location: [facility];  Service: Open Heart Surgery;  Laterality: N/A;   VIDEO BRONCHOSCOPY WITH ENDOBRONCHIAL NAVIGATION Right 05/04/2020   Procedure: VIDEO BRONCHOSCOPY WITH ENDOBRONCHIAL NAVIGATION;  Surgeon: Garner Nash, DO;  Location: [facility];  Service: Pulmonary;  Laterality: Right;   WRIST SURGERY     left     FAMILY HISTORY:  Family History  Problem Relation Age of Onset   Emphysema Father      SOCIAL HISTORY:  reports that he has been smoking cigarettes. He has a 25.50 pack-year smoking history. He quit smokeless tobacco use about 56 years ago.  His smokeless tobacco use included snuff and chew. He reports current alcohol use of about 6.0 standard drinks per week. He reports that he does not use drugs. The patient is married and lives in [place].   ALLERGIES: Ace inhibitors, Ampicillin, Penicillins, Codeine,  Lisinopril, and Aspirin   MEDICATIONS:  Current Outpatient Medications  Medication Sig Dispense Refill   albuterol (VENTOLIN HFA) 108 (90 Base) MCG/ACT inhaler INHALE 2 PUFFS BY MOUTH EVERY 4 HOURS FOR WHEEZE AND SHORTNESS OF BREATH 8.5 g 2   azithromycin (ZITHROMAX) 250 MG tablet Take 2 tablets daily for 3 days. (Patient not taking: No sig reported) 6 tablet 0   bisacodyl (BISACODYL) 5 MG EC tablet Take 15 mg by mouth daily as needed for moderate constipation.  (Patient not taking: No sig reported)     calcium carbonate (OS-CAL) 1250 (500 Ca) MG chewable tablet Chew 0.5 tablets by mouth daily.     diclofenac Sodium (VOLTAREN) 1 % GEL Apply 2 g topically 4 (four) times daily. 100 g 1   famotidine (PEPCID) 20 MG tablet Take 1 tablet (20 mg total) by mouth  2 (two) times daily. (Patient not taking: No sig reported) 60 tablet 0   fluticasone (FLONASE) 50 MCG/ACT nasal spray SPRAY 2 SPRAYS INTO EACH NOSTRIL EVERY DAY (Patient not taking: No sig reported) 16 g 2   folic acid (FOLVITE) 1 MG tablet Take 1 tablet (1 mg total) by mouth daily. (Patient not taking: No sig reported) 30 tablet 0   hydrOXYzine (ATARAX/VISTARIL) 10 MG tablet TAKE 1 TABLET(10 MG) BY MOUTH EVERY 8 HOURS AS NEEDED FOR ANXIETY 30 tablet 0   ipratropium-albuterol (DUONEB) 0.5-2.5 (3) MG/3ML SOLN Take 3 mLs by nebulization every 6 (six) hours as needed. (Patient taking differently: Take 3 mLs by nebulization every 6 (six) hours as needed (for wheezing and shortness of breath).) 360 mL 0   metoprolol tartrate (LOPRESSOR) 25 MG tablet TAKE 1 TABLET(25 MG) BY MOUTH TWICE DAILY 180 tablet 1   nitroGLYCERIN (NITROSTAT) 0.4 MG SL tablet PLACE 1 TABLET (0.4 MG TOTAL) UNDER THE TONGUE EVERY 5 (FIVE) MINUTES AS NEEDED FOR CHEST PAIN. 150 tablet 1   OXYGEN Inhale 3 L into the lungs See admin instructions. Continuous at night, as needed during the day     oxymetazoline (AFRIN) 0.05 % nasal spray Place 1 spray into both nostrils 2 (two) times  daily as needed for congestion (nose bleeds).  (Patient not taking: Reported on 06/07/2021)     thiamine 100 MG tablet Take 1 tablet (100 mg total) by mouth daily. (Patient not taking: No sig reported) 30 tablet 0   umeclidinium-vilanterol (ANORO ELLIPTA) 62.5-25 MCG/INH AEPB INHALE 1 PUFF BY MOUTH EVERY DAY 60 each 3   vitamin B-12 1000 MCG tablet Take 1 tablet (1,000 mcg total) by mouth daily. (Patient not taking: No sig reported) 30 tablet 1   No current facility-administered medications for this encounter.       PHYSICAL EXAM:  Unable to assess due to encounter type.  ECOG = 1  0 - Asymptomatic (Fully active, able to carry on all predisease activities without restriction)  1 - Symptomatic but completely ambulatory (Restricted in physically strenuous activity but ambulatory and able to carry out work of a light or sedentary nature. For example, light housework, office work)  2 - Symptomatic, <50% in bed during the day (Ambulatory and capable of all self care but unable to carry out any work activities. Up and about more than 50% of waking hours)  3 - Symptomatic, >50% in bed, but not bedbound (Capable of only limited self-care, confined to bed or chair 50% or more of waking hours)  4 - Bedbound (Completely disabled. Cannot carry on any self-care. Totally confined to bed or chair)  5 - Death   Eustace Pen MM, Creech RH, Tormey DC, et al. [phone]). "Toxicity and response criteria of the [facility]". [place] Oncol. 5 (6): 649-55    LABORATORY DATA:  Lab Results  Component Value Date   WBC 4.6 04/08/2021   HGB 13.6 04/08/2021   HCT 40.0 04/08/2021   MCV 96.2 04/08/2021   PLT 157 04/08/2021   Lab Results  Component Value Date   NA 138 04/08/2021   K 4.0 04/08/2021   CL 99 04/08/2021   CO2 28 04/08/2021   Lab Results  Component Value Date   ALT 42 04/08/2021   AST 61 (H) 04/08/2021   ALKPHOS 98 04/08/2021   BILITOT 1.0 04/08/2021       RADIOGRAPHY: CT CHEST WO CONTRAST  Result Date: 08/25/2021 CLINICAL DATA:  Non-small cell lung  cancer, restaging. EXAM: CT CHEST WITHOUT CONTRAST TECHNIQUE: Multidetector CT imaging of the chest was performed following the standard protocol without IV contrast. COMPARISON:  10/29/2020 FINDINGS: Cardiovascular: Signs of previous median sternotomy and CABG procedure. Aortic atherosclerosis. Calcification of the native coronary arteries. Heart size appears normal. No pericardial effusion. Mediastinum/Nodes: No enlarged mediastinal or axillary lymph nodes. Thyroid gland, trachea, and esophagus demonstrate no significant findings. Lungs/Pleura: Moderate to advanced changes of paraseptal and centrilobular emphysema. Within the lateral right upper lobe there is a progressive area of masslike architectural distortion with central fiducial markers indicating site of treated tumor. The underlying treated tumor is no longer measurable separate from these changes. Scar like density noted within the lingula, left lower lobe and periphery of the right base. No suspicious lung nodules identified. Upper Abdomen: No acute abnormality. Aortic atherosclerosis. Bilateral renal vascular calcifications noted. Two stones are also noted within the upper pole collecting system of the left kidney measuring up to 4 mm. The adrenal glands are unremarkable. Musculoskeletal: Remote healed right posterior rib deformities are again noted. No acute or suspicious osseous findings. IMPRESSION: 1. Progressive area of masslike architectural distortion with central fiducial markers within the lateral right upper lobe indicating site of treated tumor. The underlying treated tumor is no longer measurable separate from these changes which attributed to external beam radiation. 2. No new sites of disease. 3. Nonobstructing left renal calculi. 4. Aortic Atherosclerosis (ICD10-I70.0) and Emphysema (ICD10-J43.9). Electronically Signed   By: Kerby Moors M.D.   On: 08/25/2021 12:08        IMPRESSION/PLAN: 1. Stage IA2, cT1bN0M0, NSCLC, Squamous Cell Carcinoma of the RUL. Radiographically the patient is doing well. We discussed the findings and that he does not appear to have any active cancer at this time. We reviewed the importance of CT scan of the chest in 6 months per NCCN guidelines. He is in agreement and will call with questions or concerns prior to his next visit.  2. COPD/Atherosclerosis. He is aware of the chronic imaging findings. I encouraged him to continue to follow up with internal medicine providers, and to be seen urgently if he has acute symptoms.  3. Bed Bugs. I let him know that CT called me about seeing bed bugs and that their unit had to be sprayed. I made sure he was aware. Unfortunately since he is not still undergoing active treatment, our social work department does not anticipate being able to cover the cost of terminex treatment, but advised that he contact his landlord.   Given current concerns for patient exposure during the COVID-19 pandemic, this encounter was conducted via telephone.  The patient has provided two factor identification and has given verbal consent for this type of encounter and has been advised to only accept a meeting of this type in a secure network environment. The time spent during this encounter was 35 minutes including preparation, discussion, and coordination of the patient's care. The attendants for this meeting include  Hayden Pedro  and [name].  During the encounter,   Hayden Pedro was located remotely at home. RYLEND PIETRZAK was located at home.       Carola Rhine, PAC

## 2021-09-24 DIAGNOSIS — J90 Pleural effusion, not elsewhere classified: Secondary | ICD-10-CM | POA: Diagnosis not present

## 2021-09-24 DIAGNOSIS — I2510 Atherosclerotic heart disease of native coronary artery without angina pectoris: Secondary | ICD-10-CM | POA: Diagnosis not present

## 2021-09-24 DIAGNOSIS — J449 Chronic obstructive pulmonary disease, unspecified: Secondary | ICD-10-CM | POA: Diagnosis not present

## 2021-10-25 DIAGNOSIS — J90 Pleural effusion, not elsewhere classified: Secondary | ICD-10-CM | POA: Diagnosis not present

## 2021-10-25 DIAGNOSIS — J449 Chronic obstructive pulmonary disease, unspecified: Secondary | ICD-10-CM | POA: Diagnosis not present

## 2021-10-25 DIAGNOSIS — I2510 Atherosclerotic heart disease of native coronary artery without angina pectoris: Secondary | ICD-10-CM | POA: Diagnosis not present

## 2021-11-02 ENCOUNTER — Other Ambulatory Visit: Payer: Self-pay | Admitting: Family Medicine

## 2021-11-02 DIAGNOSIS — F419 Anxiety disorder, unspecified: Secondary | ICD-10-CM

## 2021-11-03 ENCOUNTER — Other Ambulatory Visit: Payer: Self-pay | Admitting: Family Medicine

## 2021-11-03 NOTE — Progress Notes (Deleted)
Cardiology Office Note:    Date:  11/03/2021   ID:  AYO SMOAK, DOB [birth year], MRN [id number]  PCP:  [name], DO  Cardiologist:  Lorrin Nawrot Martinique, MD  Electrophysiologist:  None   Referring MD: [name], DO   No chief complaint on file.   History of Present Illness:    Lucas [name] is a 71 y.o. male with a hx of HTN, HLD, EtOH abuse, tobacco abuse, COPD and CAD.  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  He later presented to [facility] on 10/24/2017 with chest pain.  Cardiac catheterization at the time revealed 35% proximal RCA, 85% and 50% sequential lesion in mid RCA, 70% ostial left circumflex, 65% left main/proximal LAD, occluded D2.  The LAD and left circumflex lesion was not amenable to PCI and were not critical.  The mid RCA lesion was more severe however would require atherectomy and stenting.  Given ongoing alcohol abuse, Mallory-Weiss tear and medication noncompliance, it was decided to manage medically.  Patient had COVID-19 infection in October 2020.     More recently, patient was admitted to the hospital on 12/12/2019 with chest pain. He was ruled in for NSTEMI by enzymes.  Cardiac catheterization showed severe calcified three-vessel disease with 80% distal left main to proximal LAD, 100% occluded ostial D2, 85% ostial left circumflex lesion, 60% proximal RCA, 99% mid RCA, 95% proximal to mid left circumflex lesion, 60% ostial D1, EF 50 to 55%.  Echocardiogram obtained on the same day showed EF 50 to 55%, mild MR.  Patient eventually underwent CABG x4 by Dr. Kipp Brood on 12/16/2019 with LIMA-LAD, SVG-PDA, SVG-D1, and SVG-OM2.  Hemoglobin A1c prior to the surgery was 6.1 which placed the patient in the prediabetes range.  Postprocedure, Lopressor was increased to 37.5 mg twice daily.  Noted during the hospitalization, patient had CT angiogram of the chest that demonstrated new 11 mm spiculated nodule in the  right upper lobe.  Given the history of smoking, this likely represent primary bronchogenic carcinoma.   Patient has since had several ED evaluations for epistaxis. He was admitted in July with COPD exacerbation treated with inhalers, steroids and antibiotics. CT showed enlargement of RU lobe spiculated mass. Bronchoscopy was done with path positive for squamous cell CA. Not felt to be a surgical candidate due to ongoing smoking. Referred to oncology and had SBRT.   Since his last visit with me in July 2021 he was admitted in November and December 2021 with acute respiratory failure. This first time he required mechanical ventilation. The second time he was treated with BIPAP.  On follow up today he is doing ok. Gets tight in his chest at times.rare chest pain. No cough. He is still smoking 10 cigs/day. On home oxygen but doesn't use when he is smoking.     Past Medical History:  Diagnosis Date   Acute blood loss anemia 06/21/2018   Acute upper GI bleed 06/21/2018   Allergy    Bilateral AVN of femurs ([place]) 06/25/2018   See 11/2017 Chest CT report   COPD 12/21/2006   Environmental allergies    History of femur fracture 06/25/2018   11/2017 CT AP showed Remote nonunited right femur greater trochanter fracture   History of Fracture of right tibia 06/26/2018   History of multiple right thoacic rib fractures 06/25/2018   History of Right clavicular fracture 06/25/2018   HYPERLIPIDEMIA 12/21/2006   HYPERTENSION, BENIGN SYSTEMIC  12/21/2006   Mallory-Weiss tear    TOBACCO DEPENDENCE 12/21/2006    Past Surgical History:  Procedure Laterality Date   ANKLE SURGERY     left ankle   APPLICATION OF WOUND VAC  01/06/2012   Procedure: APPLICATION OF WOUND VAC;  Surgeon: Meredith Pel, MD;  Location: [facility];  Service: Orthopedics;  Laterality: Left;   APPLICATION OF WOUND VAC Right 10/17/2015   Procedure: APPLICATION OF WOUND VAC;  Surgeon: Leandrew Koyanagi, MD;  Location: [place];  Service: Orthopedics;   Laterality: Right;   BIOPSY  06/22/2018   Procedure: BIOPSY;  Surgeon: Dorothea Ogle v, DO;  Location: [place] [facility];  Service: Gastroenterology;;   BRONCHIAL BIOPSY  05/04/2020   Procedure: BRONCHIAL BIOPSIES;  Surgeon: Garner Nash, DO;  Location: [place] [facility];  Service: Pulmonary;;   BRONCHIAL BRUSHINGS  05/04/2020   Procedure: BRONCHIAL BRUSHINGS;  Surgeon: Garner Nash, DO;  Location: [place] [facility];  Service: Pulmonary;;   BRONCHIAL NEEDLE ASPIRATION BIOPSY  05/04/2020   Procedure: BRONCHIAL NEEDLE ASPIRATION BIOPSIES;  Surgeon: Garner Nash, DO;  Location: [place];  Service: Pulmonary;;   BRONCHIAL WASHINGS  05/04/2020   Procedure: BRONCHIAL WASHINGS;  Surgeon: Garner Nash, DO;  Location: [place];  Service: Pulmonary;;   CORONARY ARTERY BYPASS GRAFT N/A 12/16/2019   Procedure: CORONARY ARTERY BYPASS GRAFTING (CABG) TIMES 4 USING LEFT INTERNAL MAMMARY ARTERY AND ENDOSCOPICALLY HARVESTED RIGHT SAPHENOUS VEIN;  Surgeon: Lajuana Matte, MD;  Location: [place];  Service: Open Heart Surgery;  Laterality: N/A;   ESOPHAGOGASTRODUODENOSCOPY (EGD) WITH PROPOFOL N/A 06/22/2018   Procedure: ESOPHAGOGASTRODUODENOSCOPY (EGD) WITH PROPOFOL;  Surgeon: [id number] v, DO;  Location: [place] [facility];  Service: Gastroenterology;  Laterality: N/A;   EXTERNAL FIXATION LEG  01/06/2012   Procedure: EXTERNAL FIXATION LEG;  Surgeon: Meredith Pel, MD;  [facility];  Service: Orthopedics;  Laterality: Left;  Smith-nephew external fixator set   EXTERNAL FIXATION LEG Right 09/16/2015   Procedure: EXTERNAL FIXATION LEG/LARGE;  Surgeon: Mcarthur Rossetti, MD;  Location: [place];  Service: Orthopedics;  Laterality: Right;   EXTERNAL FIXATION REMOVAL Right 09/22/2015   Procedure: REMOVAL EXTERNAL FIXATION LEG;  Surgeon: Mcarthur Rossetti, MD;  Location: [employer];  Service: Orthopedics;  Laterality: Right;   FASCIOTOMY  01/10/2012   Procedure: FASCIOTOMY;  Surgeon: Rozanna Box,  MD;  Location: [place];  Service: Orthopedics;  Laterality: Left;  status post fasciotomy with wound vac left calf; adjustment external fixator left tibia under fluoro; retention suture/wound vac placement left lateral calf wound   FASCIOTOMY  01/06/2012   Procedure: FASCIOTOMY;  Surgeon: Meredith Pel, MD;  [facility];  Service: Orthopedics;  Laterality: Left;  Four compartment fasciotomy   FIDUCIAL MARKER PLACEMENT  05/04/2020   Procedure: FIDUCIAL MARKER PLACEMENT;  Surgeon: Garner Nash, DO;  Location: [place] [facility];  Service: Pulmonary;;   HOT HEMOSTASIS N/A 06/22/2018   Procedure: HEMOSTASIS;  Surgeon: Dorothea Ogle v, DO;  Location: [place];  Service: Gastroenterology;  Laterality: N/A;  Epinepherine and clip placed   I & D EXTREMITY  01/12/2012   Procedure: IRRIGATION AND DEBRIDEMENT EXTREMITY;  Surgeon: Rozanna Box, MD;  Location: [place];  Service: Orthopedics;  Laterality: Left;  LAYERD CLOSURE LEFT LEG WOUND   I & D EXTREMITY Right 10/17/2015   Procedure: IRRIGATION AND DEBRIDEMENT EXTREMITY;  Surgeon: Leandrew Koyanagi, MD;  Location: [place];  Service: Orthopedics;  Laterality: Right;   I & D EXTREMITY Right 10/20/2015   Procedure: REPEAT IRRIGATION AND DEBRIDEMENT  EXTREMITY, right lower leg;  Surgeon: Mcarthur Rossetti, MD;  Location: [place];  Service: Orthopedics;  Laterality: Right;   KNEE SURGERY     left knee and right knee   LEFT HEART CATH AND CORONARY ANGIOGRAPHY N/A 06/27/2018   Procedure: LEFT HEART CATH AND CORONARY ANGIOGRAPHY;  Surgeon: Martinique, Joshalyn Ancheta M, MD;  Location: [place] [facility];  Service: Cardiovascular;  Laterality: N/A;   LEFT HEART CATH AND CORONARY ANGIOGRAPHY N/A 12/12/2019   Procedure: LEFT HEART CATH AND CORONARY ANGIOGRAPHY;  Surgeon: [place] Hampshire, MD;  Location: [place] [facility];  Service: Cardiovascular;  Laterality: N/A;   MASS EXCISION Left 09/22/2015   Procedure: EXCISION MASS;  Surgeon: Mcarthur Rossetti, MD;  Location: [place];  Service: Orthopedics;  Laterality: Left;   ORIF TIBIA PLATEAU  02/07/2012   Procedure: OPEN REDUCTION INTERNAL FIXATION (ORIF) TIBIAL PLATEAU;  Surgeon: Rozanna Box, MD;  Location: [place];  Service: Orthopedics;  Laterality: Left;  ORIF LEFT TIBIAL PLATEAU/ REMOVE EXTERNAL FIXATION LEFT LEG   ORIF TIBIA PLATEAU Right 09/22/2015   Procedure: REMOVAL OF EXTERNAL FIXATOR RIGHT LEG, OPEN REDUCTION INTERNAL FIXATION (ORIF) RIGHT TIBIAL PLATEAU, EXCISION SMALL MASS LEFT LEG;  Surgeon: Mcarthur Rossetti, MD;  Location: [place];  Service: Orthopedics;  Laterality: Right;   SKIN SPLIT GRAFT Right 10/23/2015   Procedure: RIGHT GASTROC FLAP SPLIT THICKNESS SKIN GRAFT FROM RIGHT THING TO RIGHT LEG;  Surgeon: Irene Limbo, MD;  Location: [place];  Service: Plastics;  Laterality: Right;   TEE WITHOUT CARDIOVERSION N/A 12/16/2019   Procedure: TRANSESOPHAGEAL ECHOCARDIOGRAM (TEE);  Surgeon: Lajuana Matte, MD;  Location: [place];  Service: Open Heart Surgery;  Laterality: N/A;   VIDEO BRONCHOSCOPY WITH ENDOBRONCHIAL NAVIGATION Right 05/04/2020   Procedure: VIDEO BRONCHOSCOPY WITH ENDOBRONCHIAL NAVIGATION;  Surgeon: Garner Nash, DO;  Location: [place];  Service: Pulmonary;  Laterality: Right;   WRIST SURGERY     left    Current Medications: No outpatient medications have been marked as taking for the 11/17/21 encounter (Appointment) with Martinique, Kingson Lohmeyer M, MD.     Allergies:   Ace inhibitors, Ampicillin, Penicillins, Codeine, Lisinopril, and Aspirin   Social History   Socioeconomic History   Marital status: Married    Spouse name: Not on file   Number of children: 2   Years of education: Not on file   Highest education level: Not on file  Occupational History   Not on file  Tobacco Use   Smoking status: Every Day    Packs/day: 0.50    Years: 51.00    Pack years: 25.50    Types: Cigarettes   Smokeless tobacco: Former    Types: Snuff, Chew    Quit date: 01/05/1965   Tobacco  comments:    currently half a pack a day-05/26/2020  Vaping Use   Vaping Use: Never used  Substance and Sexual Activity   Alcohol use: Yes    Alcohol/week: 6.0 standard drinks    Types: 6 Cans of beer per week    Comment: drinks about a 6-pack/day   Drug use: No   Sexual activity: Yes    Partners: Female  Other Topics Concern   Not on file  Social History Narrative   Not on file   Social Determinants of Health   Financial Resource Strain: Not on file  Food Insecurity: No Food Insecurity   Worried About Running Out of Food in the Last Year: Never true   Ran Out of  Food in the Last Year: Never true  Transportation Needs: No Transportation Needs   Lack of Transportation (Medical): No   Lack of Transportation (Non-Medical): No  Physical Activity: Not on file  Stress: Not on file  Social Connections: Not on file     Family History: The patient's family history includes Emphysema in his father.  ROS:   Please see the history of present illness.     All other systems reviewed and are negative.  EKGs/Labs/Other Studies Reviewed:    The following studies were reviewed today:  Cath 12/12/2019 Dist LM to Prox LAD lesion is 80% stenosed. Mid LM to Dist LM lesion is 50% stenosed. Ost 2nd Diag lesion is 100% stenosed. Ost Cx to Prox Cx lesion is 85% stenosed. Prox RCA lesion is 60% stenosed. Mid RCA-1 lesion is 99% stenosed. Mid RCA-2 lesion is 50% stenosed. The left ventricular systolic function is normal. LV end diastolic pressure is mildly elevated. The left ventricular ejection fraction is 50-55% by visual estimate. Ost LM lesion is 40% stenosed. Prox Cx to Mid Cx lesion is 95% stenosed. Ost 1st Diag lesion is 60% stenosed.   1.  Severe heavily calcified three-vessel coronary artery disease. 2.  Normal LV systolic function and mildly elevated left ventricular end-diastolic pressure.   Recommendations: The coronary arteries are not suitable for PCI due to heavy  diffuse calcifications.  Recommend evaluation for CABG especially that the patient has right lung mass which will likely require resection at the same time.  The patient is also a poor candidate for dual antiplatelet therapy.  There are good distal targets for CABG. Also recommend oncology consultation to ensure he does not have metastatic disease before committing to CABG.     Echo 12/12/2019 1. Left ventricular ejection fraction, by estimation, is 50 to 55%. The  left ventricle has low normal function. The left ventricle demonstrates  regional wall motion abnormalities (see scoring diagram/findings for  description). Left ventricular diastolic   parameters are indeterminate.   2. Right ventricular systolic function is mildly reduced. The right  ventricular size is normal. There is normal pulmonary artery systolic  pressure. The estimated right ventricular systolic pressure is 25.8 mmHg.   3. The mitral valve is grossly normal. Mild mitral valve regurgitation.   4. The aortic valve is tricuspid. There is moderate, nodular  calcification of the left coronary cusp as well as moderate annular  calcification. Aortic valve regurgitation is not visualized.   5. The inferior vena cava is normal in size with greater than 50%  respiratory variability, suggesting right atrial pressure of 3 mmHg.     EKG:  EKG is not ordered today.    Recent Labs: 04/08/2021: ALT 42; B Natriuretic Peptide 46.6; Hemoglobin 13.6; Platelets 157; Potassium 4.0; Sodium 138 08/24/2021: BUN 12; Creatinine 0.77  Recent Lipid Panel    Component Value Date/Time   CHOL 168 12/15/2019 0315   TRIG 57 09/07/2020 0442   HDL 75 12/15/2019 0315   CHOLHDL 2.2 12/15/2019 0315   VLDL 6 12/15/2019 0315   LDLCALC 87 12/15/2019 0315   LDLDIRECT 105 (H) 05/16/2017 1528   LDLDIRECT 150 (H) 02/13/2013 1502    Physical Exam:    VS:  There were no vitals taken for this visit.    Wt Readings from Last 3 Encounters:  12/28/20 133  lb 6 oz (60.5 kg)  10/07/20 135 lb 6.4 oz (61.4 kg)  09/29/20 130 lb (59 kg)     GEN:  Well nourished,  thin BM  in no acute distress HEENT: Normal NECK: No JVD; No carotid bruits LYMPHATICS: No lymphadenopathy CARDIAC: RRR, no murmurs, rubs, gallops RESPIRATORY:  Clear to auscultation without rales, wheezing or rhonchi  ABDOMEN: Soft, non-tender, non-distended MUSCULOSKELETAL:  No edema; No deformity  SKIN: Warm and dry NEUROLOGIC:  Alert and oriented x 3 PSYCHIATRIC:  Normal affect   ASSESSMENT:    No diagnosis found.  PLAN:    In order of problems listed above:  CAD s/p CABG in February: He is doing well from a cardiac standpoint. No significant angina. On ASA 81 mg daily. Continue metoprolol and statin.  Hypertension: Blood pressure stable on current therapy  Hyperlipidemia: Continue Crestor patient has 40 mg and 20 mg listed on medication list. I asked him to verify what dose he is taking at home.  COPD: recent  acute exacerbation  Tobacco abuse: Although he quit smoking temporarily after bypass surgery, he has since picked up tobacco again.  I urged him to consider tobacco cessation   Prediabetes: Managed by primary care provider.  Squamous cell CA RUL Not a surgical candidate. To see oncology to see if a candidate for SBRT   Medication Adjustments/Labs and Tests Ordered: Current medicines are reviewed at length with the patient today.  Concerns regarding medicines are outlined above.  No orders of the defined types were placed in this encounter.  No orders of the defined types were placed in this encounter.   There are no Patient Instructions on file for this visit.   Signed, Albeiro Trompeter Martinique, MD  11/03/2021 2:29 PM    [place]

## 2021-11-12 ENCOUNTER — Telehealth: Payer: Self-pay

## 2021-11-12 NOTE — Telephone Encounter (Signed)
Volunteer called patient/family on behalf of [facility] and did not get a answer from patient/family.

## 2021-11-17 ENCOUNTER — Ambulatory Visit: Payer: Medicare Other | Admitting: Cardiology

## 2021-11-25 DIAGNOSIS — J90 Pleural effusion, not elsewhere classified: Secondary | ICD-10-CM | POA: Diagnosis not present

## 2021-11-25 DIAGNOSIS — J449 Chronic obstructive pulmonary disease, unspecified: Secondary | ICD-10-CM | POA: Diagnosis not present

## 2021-11-25 DIAGNOSIS — I2510 Atherosclerotic heart disease of native coronary artery without angina pectoris: Secondary | ICD-10-CM | POA: Diagnosis not present

## 2021-11-26 ENCOUNTER — Encounter (HOSPITAL_COMMUNITY): Payer: Self-pay

## 2021-11-26 ENCOUNTER — Emergency Department (HOSPITAL_COMMUNITY): Payer: Medicare Other

## 2021-11-26 ENCOUNTER — Emergency Department (HOSPITAL_COMMUNITY)
Admission: EM | Admit: 2021-11-26 | Discharge: 2021-11-26 | Disposition: A | Discharge status: Home / Self Care | Payer: Medicare Other | Attending: Emergency Medicine | Admitting: Emergency Medicine

## 2021-11-26 DIAGNOSIS — Z743 Need for continuous supervision: Secondary | ICD-10-CM | POA: Diagnosis not present

## 2021-11-26 DIAGNOSIS — J441 Chronic obstructive pulmonary disease with (acute) exacerbation: Secondary | ICD-10-CM | POA: Diagnosis not present

## 2021-11-26 DIAGNOSIS — R0602 Shortness of breath: Secondary | ICD-10-CM | POA: Diagnosis not present

## 2021-11-26 DIAGNOSIS — R062 Wheezing: Secondary | ICD-10-CM | POA: Diagnosis not present

## 2021-11-26 DIAGNOSIS — Z20822 Contact with and (suspected) exposure to covid-19: Secondary | ICD-10-CM | POA: Insufficient documentation

## 2021-11-26 DIAGNOSIS — R0902 Hypoxemia: Secondary | ICD-10-CM | POA: Diagnosis not present

## 2021-11-26 LAB — CBC WITH DIFFERENTIAL/PLATELET
Abs Immature Granulocytes: 0.01 10*3/uL (ref 0.00–0.07)
Basophils Absolute: 0 10*3/uL (ref 0.0–0.1)
Basophils Relative: 0 %
Eosinophils Absolute: 0.1 10*3/uL (ref 0.0–0.5)
Eosinophils Relative: 1 %
HCT: 38.1 % — ABNORMAL LOW (ref 39.0–52.0)
Hemoglobin: 12.1 g/dL — ABNORMAL LOW (ref 13.0–17.0)
Immature Granulocytes: 0 %
Lymphocytes Relative: 14 %
Lymphs Abs: 0.7 10*3/uL (ref 0.7–4.0)
MCH: 31.3 pg (ref 26.0–34.0)
MCHC: 31.8 g/dL (ref 30.0–36.0)
MCV: 98.7 fL (ref 80.0–100.0)
Monocytes Absolute: 0.4 10*3/uL (ref 0.1–1.0)
Monocytes Relative: 8 %
Neutro Abs: 3.7 10*3/uL (ref 1.7–7.7)
Neutrophils Relative %: 77 %
Platelets: 170 10*3/uL (ref 150–400)
RBC: 3.86 MIL/uL — ABNORMAL LOW (ref 4.22–5.81)
RDW: 13.2 % (ref 11.5–15.5)
WBC: 4.9 10*3/uL (ref 4.0–10.5)
nRBC: 0 % (ref 0.0–0.2)

## 2021-11-26 LAB — RESP PANEL BY RT-PCR (FLU A&B, COVID) ARPGX2
Influenza A by PCR: NEGATIVE
Influenza B by PCR: NEGATIVE
SARS Coronavirus 2 by RT PCR: NEGATIVE

## 2021-11-26 LAB — BRAIN NATRIURETIC PEPTIDE: B Natriuretic Peptide: 146.5 pg/mL — ABNORMAL HIGH (ref 0.0–100.0)

## 2021-11-26 LAB — BASIC METABOLIC PANEL
Anion gap: 8 (ref 5–15)
BUN: 7 mg/dL — ABNORMAL LOW (ref 8–23)
CO2: 32 mmol/L (ref 22–32)
Calcium: 9.6 mg/dL (ref 8.9–10.3)
Chloride: 98 mmol/L (ref 98–111)
Creatinine, Ser: 0.72 mg/dL (ref 0.61–1.24)
GFR, Estimated: >60 mL/min (ref >60)
Glucose, Bld: 103 mg/dL — ABNORMAL HIGH (ref 70–99)
Potassium: 4.2 mmol/L (ref 3.5–5.1)
Sodium: 138 mmol/L (ref 135–145)

## 2021-11-26 LAB — TROPONIN I (HIGH SENSITIVITY)
Troponin I (High Sensitivity): 22 ng/L — ABNORMAL HIGH (ref <18)
Troponin I (High Sensitivity): 28 ng/L — ABNORMAL HIGH (ref <18)

## 2021-11-26 MED ORDER — IPRATROPIUM-ALBUTEROL 0.5-2.5 (3) MG/3ML IN SOLN
3.0000 mL | RESPIRATORY_TRACT | Status: AC
  Administered 2021-11-26 (×3): 3 mL via RESPIRATORY_TRACT
  Filled 2021-11-26: qty 3

## 2021-11-26 MED ORDER — ALBUTEROL SULFATE HFA 108 (90 BASE) MCG/ACT IN AERS
2.0000 | INHALATION_SPRAY | Freq: Once | RESPIRATORY_TRACT | Status: AC
  Administered 2021-11-26: 2 via RESPIRATORY_TRACT
  Filled 2021-11-26: qty 6.7

## 2021-11-26 MED ORDER — MAGNESIUM SULFATE 2 GM/50ML IV SOLN
2.0000 g | Freq: Once | INTRAVENOUS | Status: AC
  Administered 2021-11-26: 2 g via INTRAVENOUS
  Filled 2021-11-26: qty 50

## 2021-11-26 MED ORDER — METHYLPREDNISOLONE SODIUM SUCC 125 MG IJ SOLR
125.0000 mg | Freq: Once | INTRAMUSCULAR | Status: AC
  Administered 2021-11-26: 125 mg via INTRAVENOUS
  Filled 2021-11-26: qty 2

## 2021-11-26 MED ORDER — PREDNISONE 20 MG PO TABS: ORAL_TABLET | ORAL | 0 refills | Status: DC

## 2021-11-26 NOTE — Discharge Instructions (Addendum)
Use your inhaler every 4 hours(6 puffs) while awake, return for sudden worsening shortness of breath, or if you need to use your inhaler more often.   Follow up with your family doc in the office.  Return for worsening chest pain

## 2021-11-26 NOTE — ED Notes (Signed)
Pt given paper scrubs and skid socks applied,  pt wheeled to front lobby, wife called for ride home.  Pt verbalized understanding of care and follow up.  Pt dc via wheel chair to front lobby awaiting ride.

## 2021-11-26 NOTE — ED Provider Notes (Signed)
[facility] EMERGENCY DEPARTMENT Provider Note   CSN: [id number] Arrival date & time: 11/26/21  1001     History  Chief Complaint  Patient presents with   Shortness of Breath    Lucas [name] is a 71 y.o. male.  71 yo M with a chief complaints of shortness of breath.  Feels like this just started this morning.  He had otherwise had trouble breathing off and on.  Has a history of COPD is not sure if this feels similar.  Has had some burning chest discomfort that is been off and on.  Has had this happen to him in the past and was told it might be due to anxiety.  Was given a DuoNeb with EMS without significant improvement.  No known sick contacts.  Was noted to have bedbugs and so went through [facility] prior to the bed.   Shortness of Breath     Home Medications Prior to Admission medications   Medication Sig Start Date End Date Taking? Authorizing Provider  albuterol (VENTOLIN HFA) 108 (90 Base) MCG/ACT inhaler INHALE 2 PUFFS BY MOUTH EVERY 4 HOURS FOR WHEEZE AND SHORTNESS OF BREATH Patient taking differently: 2 puffs every 4 (four) hours as needed for wheezing or shortness of breath. 08/03/21  Yes Brimage, Vondra, DO  ANORO ELLIPTA 62.5-25 MCG/ACT AEPB INHALE 1 PUFF BY MOUTH EVERY DAY Patient taking differently: 1 puff daily. 11/04/21  Yes Brimage, Vondra, DO  diclofenac Sodium (VOLTAREN) 1 % GEL Apply 2 g topically 4 (four) times daily. Patient taking differently: Apply 2 g topically 4 (four) times daily as needed (pain). 12/28/20  Yes Brimage, Vondra, DO  hydrOXYzine (ATARAX) 10 MG tablet TAKE 1 TABLET(10 MG) BY MOUTH EVERY 8 HOURS AS NEEDED FOR ANXIETY Patient taking differently: Take 10 mg by mouth every 8 (eight) hours as needed for anxiety. 11/04/21  Yes Brimage, Vondra, DO  ipratropium-albuterol (DUONEB) 0.5-2.5 (3) MG/3ML SOLN Take 3 mLs by nebulization every 6 (six) hours as needed. Patient taking differently: Take 3 mLs by nebulization every 6 (six) hours as  needed (for wheezing and shortness of breath). 09/10/20  Yes Ghimire, Henreitta Leber, MD  metoprolol tartrate (LOPRESSOR) 25 MG tablet TAKE 1 TABLET(25 MG) BY MOUTH TWICE DAILY Patient taking differently: Take 25 mg by mouth 2 (two) times daily. 08/24/21  Yes Brimage, Vondra, DO  nitroGLYCERIN (NITROSTAT) 0.4 MG SL tablet PLACE 1 TABLET (0.4 MG TOTAL) UNDER THE TONGUE EVERY 5 (FIVE) MINUTES AS NEEDED FOR CHEST PAIN. 10/05/20  Yes Brimage, Vondra, DO  OXYGEN Inhale 3 L into the lungs See admin instructions. Continuous at night, as needed during the day   Yes [provider]  oxymetazoline (AFRIN) 0.05 % nasal spray Place 1 spray into both nostrils 2 (two) times daily as needed for congestion (nose bleeds).   Yes [provider]  predniSONE (DELTASONE) 20 MG tablet 2 tabs po daily x 4 days 11/26/21  Yes Deno Etienne, DO  rosuvastatin (CRESTOR) 40 MG tablet Take 40 mg by mouth daily. 11/02/21  Yes [provider]  loratadine (CLARITIN) 10 MG tablet Take 1 tablet (10 mg total) by mouth daily. 03/04/11 05/23/18  Mariana Arn, MD      Allergies    Ace inhibitors, Ampicillin, Penicillins, Codeine, Lisinopril, and Aspirin    Review of Systems   Review of Systems  Respiratory:  Positive for shortness of breath.    Physical Exam Updated Vital Signs BP 130/72    Pulse 80  Temp 98.2 F (36.8 C) (Oral)    Resp (!) 23    Ht 5\' 11"  (1.803 m)    Wt 61 kg    SpO2 100%    BMI 18.76 kg/m  Physical Exam Vitals and nursing note reviewed.  Constitutional:      Appearance: He is well-developed.  HENT:     Head: Normocephalic and atraumatic.  Eyes:     Pupils: Pupils are equal, round, and reactive to light.  Neck:     Vascular: No JVD.  Cardiovascular:     Rate and Rhythm: Normal rate and regular rhythm.     Heart sounds: No murmur heard.   No friction rub. No gallop.  Pulmonary:     Effort: No respiratory distress.     Breath sounds: Decreased breath sounds present. No wheezing.      Comments: Diminished breath sounds in all lung fields Abdominal:     General: There is no distension.     Tenderness: There is no abdominal tenderness. There is no guarding or rebound.  Musculoskeletal:        General: Normal range of motion.     Cervical back: Normal range of motion and neck supple.  Skin:    Coloration: Skin is not pale.     Findings: No rash.  Neurological:     Mental Status: He is alert and oriented to person, place, and time.  Psychiatric:        Behavior: Behavior normal.    ED Results / Procedures / Treatments   Labs (all labs ordered are listed, but only abnormal results are displayed) Labs Reviewed  CBC WITH DIFFERENTIAL/PLATELET - Abnormal; Notable for the following components:      Result Value   RBC 3.86 (*)    Hemoglobin 12.1 (*)    HCT 38.1 (*)    All other components within normal limits  BASIC METABOLIC PANEL - Abnormal; Notable for the following components:   Glucose, Bld 103 (*)    BUN 7 (*)    All other components within normal limits  BRAIN NATRIURETIC PEPTIDE - Abnormal; Notable for the following components:   B Natriuretic Peptide 146.5 (*)    All other components within normal limits  TROPONIN I (HIGH SENSITIVITY) - Abnormal; Notable for the following components:   Troponin I (High Sensitivity) 22 (*)    All other components within normal limits  TROPONIN I (HIGH SENSITIVITY) - Abnormal; Notable for the following components:   Troponin I (High Sensitivity) 28 (*)    All other components within normal limits  RESP PANEL BY RT-PCR (FLU A&B, COVID) [id number]    EKG EKG Interpretation  Date/Time:  Friday November 26 2021 10:35:29 EST Ventricular Rate:  92 PR Interval:  185 QRS Duration: 86 QT Interval:  387 QTC Calculation: 479 R Axis:   73 Text Interpretation: Sinus rhythm LAE, consider biatrial enlargement Probable left ventricular hypertrophy Borderline prolonged QT interval No significant change since last tracing Confirmed by  Deno Etienne [phone]) on 11/26/2021 11:06:01 AM  Radiology DG Chest Port 1 View  Result Date: 11/26/2021 CLINICAL DATA:  Shortness of breath EXAM: PORTABLE CHEST 1 VIEW COMPARISON:  Chest x-ray 04/08/2021 FINDINGS: Cardiomediastinal silhouette is unchanged. Heart size is normal. Cardiac surgical changes and median sternotomy wires. Pulmonary vasculature is normal. Stable appearing spiculated irregular chronic opacities in the right upper lung near the apex. Chronic linear likely scarring opacities at the left lung base. No new consolidation identified. No significant pleural effusion or  pneumothorax visualized. IMPRESSION: Stable appearing chronic changes with no acute process identified. Electronically Signed   By: Ofilia Neas M.D.   On: 11/26/2021 11:09    Procedures Procedures    Medications Ordered in ED Medications  albuterol (VENTOLIN HFA) 108 (90 Base) MCG/ACT inhaler 2 puff (has no administration in time range)  ipratropium-albuterol (DUONEB) 0.5-2.5 (3) MG/3ML nebulizer solution 3 mL (3 mLs Nebulization Given 11/26/21 1106)  methylPREDNISolone sodium succinate (SOLU-MEDROL) 125 mg/2 mL injection 125 mg (125 mg Intravenous Given 11/26/21 1105)  magnesium sulfate IVPB 2 g 50 mL (0 g Intravenous Stopped 11/26/21 1130)    ED Course/ Medical Decision Making/ A&P                           Medical Decision Making Amount and/or Complexity of Data Reviewed Labs: ordered. Radiology: ordered.  Risk Prescription drug management.   71 yo M with a chief complaints of shortness of breath.  This been going on for just a few hours now.  He denies any significant cough congestion or fever.  On my record review is that ED visits in the past consistent with a COPD exacerbation.  He is not convinced that this is the same.  Reportedly has been using his medications but per further history from EMS has not seen a doctor in at least a year.  My exam is most consistent with a COPD exacerbation.  We will  give 3 duo nebs back-to-back and steroids and magnesium and reassess.  Patient feeling much better on reassessment.  Chest x-ray independently interpreted by me without focal infiltrate or pneumothorax.  Initial troponin is at baseline.  Repeated trivially elevated.  He is now not having any chest discomfort.  Much more likely COPD exacerbation.  We will have him continue to do beta agonist at home.  Burst of steroids.    3:24 PM:  I have discussed the diagnosis/risks/treatment options with the patient.  Evaluation and diagnostic testing in the emergency department does not suggest an emergent condition requiring admission or immediate intervention beyond what has been performed at this time.  They will follow up with  PCP. We also discussed returning to the ED immediately if new or worsening sx occur. We discussed the sx which are most concerning (e.g., sudden worsening pain, fever, inability to tolerate by mouth) that necessitate immediate return. Medications administered to the patient during their visit and any new prescriptions provided to the patient are listed below.  Medications given during this visit Medications  albuterol (VENTOLIN HFA) 108 (90 Base) MCG/ACT inhaler 2 puff (has no administration in time range)  ipratropium-albuterol (DUONEB) 0.5-2.5 (3) MG/3ML nebulizer solution 3 mL (3 mLs Nebulization Given 11/26/21 1106)  methylPREDNISolone sodium succinate (SOLU-MEDROL) 125 mg/2 mL injection 125 mg (125 mg Intravenous Given 11/26/21 1105)  magnesium sulfate IVPB 2 g 50 mL (0 g Intravenous Stopped 11/26/21 1130)     The patient appears reasonably screen and/or stabilized for discharge and I doubt any other medical condition or other [facility] requiring further screening, evaluation, or treatment in the ED at this time prior to discharge.           Final Clinical Impression(s) / ED Diagnoses Final diagnoses:  COPD exacerbation ([place])    Rx / DC Orders ED Discharge Orders           Ordered    predniSONE (DELTASONE) 20 MG tablet        11/26/21 1523  Deno Etienne, DO 11/26/21 1524

## 2021-11-26 NOTE — ED Triage Notes (Signed)
Pt complaining of SOB, pt has COPD, pt placed on O2 [place] 3L PRN per pt, pt given a neb 2.5 alb, and duoneb by EMS.  Pt refused an IV.  Pt noted to have wheezing.  Pt in decon due to bedbugs upon arrival

## 2021-12-09 ENCOUNTER — Telehealth: Payer: Self-pay

## 2021-12-09 NOTE — Telephone Encounter (Signed)
Left message to schedule palliative follow up

## 2021-12-10 ENCOUNTER — Other Ambulatory Visit: Payer: Self-pay

## 2021-12-10 DIAGNOSIS — J449 Chronic obstructive pulmonary disease, unspecified: Secondary | ICD-10-CM

## 2021-12-10 DIAGNOSIS — I208 Other forms of angina pectoris: Secondary | ICD-10-CM

## 2021-12-10 MED ORDER — NITROGLYCERIN 0.4 MG SL SUBL: 0.4000 mg | SUBLINGUAL_TABLET | SUBLINGUAL | 1 refills | Status: AC | PRN

## 2021-12-10 MED ORDER — ALBUTEROL SULFATE HFA 108 (90 BASE) MCG/ACT IN AERS: INHALATION_SPRAY | RESPIRATORY_TRACT | 2 refills | Status: DC

## 2021-12-23 DIAGNOSIS — J90 Pleural effusion, not elsewhere classified: Secondary | ICD-10-CM | POA: Diagnosis not present

## 2021-12-23 DIAGNOSIS — J449 Chronic obstructive pulmonary disease, unspecified: Secondary | ICD-10-CM | POA: Diagnosis not present

## 2021-12-23 DIAGNOSIS — I2510 Atherosclerotic heart disease of native coronary artery without angina pectoris: Secondary | ICD-10-CM | POA: Diagnosis not present

## 2022-01-03 ENCOUNTER — Other Ambulatory Visit: Payer: Self-pay

## 2022-01-03 ENCOUNTER — Inpatient Hospital Stay (HOSPITAL_COMMUNITY)
Admission: EM | Admit: 2022-01-03 | Discharge: 2022-01-05 | DRG: 190 | Disposition: A | Discharge status: Home Health | Payer: Medicare Other | Attending: Family Medicine | Admitting: Family Medicine

## 2022-01-03 ENCOUNTER — Observation Stay (HOSPITAL_COMMUNITY): Payer: Medicare Other

## 2022-01-03 ENCOUNTER — Encounter (HOSPITAL_COMMUNITY): Payer: Self-pay | Admitting: *Deleted

## 2022-01-03 ENCOUNTER — Emergency Department (HOSPITAL_COMMUNITY): Payer: Medicare Other

## 2022-01-03 DIAGNOSIS — I499 Cardiac arrhythmia, unspecified: Secondary | ICD-10-CM | POA: Diagnosis not present

## 2022-01-03 DIAGNOSIS — I480 Paroxysmal atrial fibrillation: Secondary | ICD-10-CM | POA: Diagnosis present

## 2022-01-03 DIAGNOSIS — R0602 Shortness of breath: Secondary | ICD-10-CM | POA: Diagnosis not present

## 2022-01-03 DIAGNOSIS — Z7952 Long term (current) use of systemic steroids: Secondary | ICD-10-CM | POA: Diagnosis not present

## 2022-01-03 DIAGNOSIS — E785 Hyperlipidemia, unspecified: Secondary | ICD-10-CM | POA: Diagnosis present

## 2022-01-03 DIAGNOSIS — J9621 Acute and chronic respiratory failure with hypoxia: Secondary | ICD-10-CM

## 2022-01-03 DIAGNOSIS — R54 Age-related physical debility: Secondary | ICD-10-CM | POA: Diagnosis present

## 2022-01-03 DIAGNOSIS — J441 Chronic obstructive pulmonary disease with (acute) exacerbation: Secondary | ICD-10-CM | POA: Diagnosis not present

## 2022-01-03 DIAGNOSIS — Z9981 Dependence on supplemental oxygen: Secondary | ICD-10-CM

## 2022-01-03 DIAGNOSIS — I252 Old myocardial infarction: Secondary | ICD-10-CM

## 2022-01-03 DIAGNOSIS — Z880 Allergy status to penicillin: Secondary | ICD-10-CM

## 2022-01-03 DIAGNOSIS — F17210 Nicotine dependence, cigarettes, uncomplicated: Secondary | ICD-10-CM | POA: Diagnosis present

## 2022-01-03 DIAGNOSIS — R64 Cachexia: Secondary | ICD-10-CM | POA: Diagnosis present

## 2022-01-03 DIAGNOSIS — Z7951 Long term (current) use of inhaled steroids: Secondary | ICD-10-CM

## 2022-01-03 DIAGNOSIS — Z20822 Contact with and (suspected) exposure to covid-19: Secondary | ICD-10-CM | POA: Diagnosis present

## 2022-01-03 DIAGNOSIS — J449 Chronic obstructive pulmonary disease, unspecified: Secondary | ICD-10-CM | POA: Diagnosis present

## 2022-01-03 DIAGNOSIS — I4891 Unspecified atrial fibrillation: Secondary | ICD-10-CM | POA: Diagnosis not present

## 2022-01-03 DIAGNOSIS — J42 Unspecified chronic bronchitis: Secondary | ICD-10-CM

## 2022-01-03 DIAGNOSIS — G629 Polyneuropathy, unspecified: Secondary | ICD-10-CM | POA: Diagnosis present

## 2022-01-03 DIAGNOSIS — Z85118 Personal history of other malignant neoplasm of bronchus and lung: Secondary | ICD-10-CM

## 2022-01-03 DIAGNOSIS — F419 Anxiety disorder, unspecified: Secondary | ICD-10-CM | POA: Diagnosis present

## 2022-01-03 DIAGNOSIS — Z825 Family history of asthma and other chronic lower respiratory diseases: Secondary | ICD-10-CM

## 2022-01-03 DIAGNOSIS — R231 Pallor: Secondary | ICD-10-CM | POA: Diagnosis not present

## 2022-01-03 DIAGNOSIS — Z743 Need for continuous supervision: Secondary | ICD-10-CM | POA: Diagnosis not present

## 2022-01-03 DIAGNOSIS — Z6820 Body mass index (BMI) 20.0-20.9, adult: Secondary | ICD-10-CM

## 2022-01-03 DIAGNOSIS — I248 Other forms of acute ischemic heart disease: Secondary | ICD-10-CM | POA: Diagnosis not present

## 2022-01-03 DIAGNOSIS — Z8719 Personal history of other diseases of the digestive system: Secondary | ICD-10-CM

## 2022-01-03 DIAGNOSIS — Z888 Allergy status to other drugs, medicaments and biological substances status: Secondary | ICD-10-CM

## 2022-01-03 DIAGNOSIS — R0603 Acute respiratory distress: Secondary | ICD-10-CM | POA: Diagnosis not present

## 2022-01-03 DIAGNOSIS — Z9114 Patient's other noncompliance with medication regimen: Secondary | ICD-10-CM

## 2022-01-03 DIAGNOSIS — K7460 Unspecified cirrhosis of liver: Secondary | ICD-10-CM | POA: Diagnosis not present

## 2022-01-03 DIAGNOSIS — Z79899 Other long term (current) drug therapy: Secondary | ICD-10-CM

## 2022-01-03 DIAGNOSIS — I2510 Atherosclerotic heart disease of native coronary artery without angina pectoris: Secondary | ICD-10-CM | POA: Diagnosis present

## 2022-01-03 DIAGNOSIS — R6889 Other general symptoms and signs: Secondary | ICD-10-CM | POA: Diagnosis not present

## 2022-01-03 DIAGNOSIS — Z885 Allergy status to narcotic agent status: Secondary | ICD-10-CM

## 2022-01-03 DIAGNOSIS — R0902 Hypoxemia: Secondary | ICD-10-CM | POA: Diagnosis not present

## 2022-01-03 DIAGNOSIS — I10 Essential (primary) hypertension: Secondary | ICD-10-CM | POA: Diagnosis not present

## 2022-01-03 DIAGNOSIS — Z923 Personal history of irradiation: Secondary | ICD-10-CM

## 2022-01-03 DIAGNOSIS — Z951 Presence of aortocoronary bypass graft: Secondary | ICD-10-CM

## 2022-01-03 DIAGNOSIS — Z8711 Personal history of peptic ulcer disease: Secondary | ICD-10-CM

## 2022-01-03 DIAGNOSIS — E440 Moderate protein-calorie malnutrition: Secondary | ICD-10-CM | POA: Insufficient documentation

## 2022-01-03 DIAGNOSIS — Z886 Allergy status to analgesic agent status: Secondary | ICD-10-CM

## 2022-01-03 DIAGNOSIS — F1010 Alcohol abuse, uncomplicated: Secondary | ICD-10-CM | POA: Diagnosis present

## 2022-01-03 DIAGNOSIS — J439 Emphysema, unspecified: Secondary | ICD-10-CM | POA: Diagnosis not present

## 2022-01-03 LAB — CBC WITH DIFFERENTIAL/PLATELET
Abs Immature Granulocytes: 0.01 10*3/uL (ref 0.00–0.07)
Basophils Absolute: 0 10*3/uL (ref 0.0–0.1)
Basophils Relative: 1 %
Eosinophils Absolute: 0.1 10*3/uL (ref 0.0–0.5)
Eosinophils Relative: 2 %
HCT: 42.7 % (ref 39.0–52.0)
Hemoglobin: 13.9 g/dL (ref 13.0–17.0)
Immature Granulocytes: 0 %
Lymphocytes Relative: 54 %
Lymphs Abs: 3.8 10*3/uL (ref 0.7–4.0)
MCH: 31.9 pg (ref 26.0–34.0)
MCHC: 32.6 g/dL (ref 30.0–36.0)
MCV: 97.9 fL (ref 80.0–100.0)
Monocytes Absolute: 0.9 10*3/uL (ref 0.1–1.0)
Monocytes Relative: 13 %
Neutro Abs: 2.1 10*3/uL (ref 1.7–7.7)
Neutrophils Relative %: 30 %
Platelets: 174 10*3/uL (ref 150–400)
RBC: 4.36 MIL/uL (ref 4.22–5.81)
RDW: 14 % (ref 11.5–15.5)
WBC: 7 10*3/uL (ref 4.0–10.5)
nRBC: 0 % (ref 0.0–0.2)

## 2022-01-03 LAB — I-STAT VENOUS BLOOD GAS, ED
Acid-Base Excess: 5 mmol/L — ABNORMAL HIGH (ref 0.0–2.0)
Bicarbonate: 30.8 mmol/L — ABNORMAL HIGH (ref 20.0–28.0)
Calcium, Ion: 1.14 mmol/L — ABNORMAL LOW (ref 1.15–1.40)
HCT: 41 % (ref 39.0–52.0)
Hemoglobin: 13.9 g/dL (ref 13.0–17.0)
O2 Saturation: 98 %
Potassium: 4.2 mmol/L (ref 3.5–5.1)
Sodium: 135 mmol/L (ref 135–145)
TCO2: 32 mmol/L (ref 22–32)
pCO2, Ven: 47.4 mmHg (ref 44–60)
pH, Ven: 7.42 (ref 7.25–7.43)
pO2, Ven: 109 mmHg — ABNORMAL HIGH (ref 32–45)

## 2022-01-03 LAB — ECHOCARDIOGRAM COMPLETE
AR max vel: 2.64 cm2
AV Area VTI: 2.65 cm2
AV Area mean vel: 2.66 cm2
AV Mean grad: 3 mmHg
AV Peak grad: 5.7 mmHg
Ao pk vel: 1.19 m/s
Area-P 1/2: 2.39 cm2
Height: 71 in
S' Lateral: 4.2 cm
Single Plane A4C EF: 47.5 %
Weight: 2320 oz

## 2022-01-03 LAB — HEPARIN LEVEL (UNFRACTIONATED): Heparin Unfractionated: 0.28 IU/mL — ABNORMAL LOW (ref 0.30–0.70)

## 2022-01-03 LAB — BASIC METABOLIC PANEL
Anion gap: 14 (ref 5–15)
BUN: 8 mg/dL (ref 8–23)
CO2: 29 mmol/L (ref 22–32)
Calcium: 9.6 mg/dL (ref 8.9–10.3)
Chloride: 96 mmol/L — ABNORMAL LOW (ref 98–111)
Creatinine, Ser: 1.03 mg/dL (ref 0.61–1.24)
GFR, Estimated: >60 mL/min (ref >60)
Glucose, Bld: 132 mg/dL — ABNORMAL HIGH (ref 70–99)
Potassium: 4.6 mmol/L (ref 3.5–5.1)
Sodium: 139 mmol/L (ref 135–145)

## 2022-01-03 LAB — MAGNESIUM
Magnesium: 2.3 mg/dL (ref 1.7–2.4)
Magnesium: 2 mg/dL (ref 1.7–2.4)

## 2022-01-03 LAB — TROPONIN I (HIGH SENSITIVITY)
Troponin I (High Sensitivity): 23 ng/L — ABNORMAL HIGH (ref <18)
Troponin I (High Sensitivity): 71 ng/L — ABNORMAL HIGH (ref <18)
Troponin I (High Sensitivity): 77 ng/L — ABNORMAL HIGH (ref <18)

## 2022-01-03 LAB — RESP PANEL BY RT-PCR (FLU A&B, COVID) ARPGX2
Influenza A by PCR: NEGATIVE
Influenza B by PCR: NEGATIVE
SARS Coronavirus 2 by RT PCR: NEGATIVE

## 2022-01-03 MED ORDER — PREDNISONE 20 MG PO TABS
40.0000 mg | ORAL_TABLET | Freq: Every day | ORAL | Status: DC
  Administered 2022-01-04 – 2022-01-05 (×2): 40 mg via ORAL
  Filled 2022-01-03 (×2): qty 2

## 2022-01-03 MED ORDER — THIAMINE HCL 100 MG PO TABS
100.0000 mg | ORAL_TABLET | Freq: Every day | ORAL | Status: DC
  Administered 2022-01-03 – 2022-01-05 (×3): 100 mg via ORAL
  Filled 2022-01-03 (×3): qty 1

## 2022-01-03 MED ORDER — IPRATROPIUM-ALBUTEROL 0.5-2.5 (3) MG/3ML IN SOLN
3.0000 mL | Freq: Four times a day (QID) | RESPIRATORY_TRACT | Status: DC
  Administered 2022-01-03 – 2022-01-04 (×3): 3 mL via RESPIRATORY_TRACT
  Filled 2022-01-03 (×3): qty 3

## 2022-01-03 MED ORDER — ROSUVASTATIN CALCIUM 20 MG PO TABS
40.0000 mg | ORAL_TABLET | Freq: Every day | ORAL | Status: DC
  Administered 2022-01-03 – 2022-01-05 (×3): 40 mg via ORAL
  Filled 2022-01-03 (×3): qty 2

## 2022-01-03 MED ORDER — ACETAMINOPHEN 650 MG RE SUPP: 650.0000 mg | Freq: Four times a day (QID) | RECTAL | Status: DC | PRN

## 2022-01-03 MED ORDER — IPRATROPIUM-ALBUTEROL 0.5-2.5 (3) MG/3ML IN SOLN
3.0000 mL | Freq: Once | RESPIRATORY_TRACT | Status: AC
  Administered 2022-01-03: 3 mL via RESPIRATORY_TRACT
  Filled 2022-01-03: qty 3

## 2022-01-03 MED ORDER — HEPARIN (PORCINE) 25000 UT/250ML-% IV SOLN
1100.0000 [IU]/h | INTRAVENOUS | Status: DC
  Administered 2022-01-03: 1000 [IU]/h via INTRAVENOUS
  Administered 2022-01-04: 1100 [IU]/h via INTRAVENOUS
  Filled 2022-01-03 (×2): qty 250

## 2022-01-03 MED ORDER — METOPROLOL TARTRATE 5 MG/5ML IV SOLN
2.5000 mg | Freq: Once | INTRAVENOUS | Status: AC
  Administered 2022-01-03: 2.5 mg via INTRAVENOUS
  Filled 2022-01-03: qty 5

## 2022-01-03 MED ORDER — HEPARIN BOLUS VIA INFUSION
3000.0000 [IU] | Freq: Once | INTRAVENOUS | Status: AC
Start: 2022-01-03 — End: 2022-01-03
  Administered 2022-01-03: 3000 [IU] via INTRAVENOUS
  Filled 2022-01-03: qty 3000

## 2022-01-03 MED ORDER — ACETAMINOPHEN 325 MG PO TABS: 650.0000 mg | ORAL_TABLET | Freq: Four times a day (QID) | ORAL | Status: DC | PRN

## 2022-01-03 MED ORDER — MAGNESIUM SULFATE 2 GM/50ML IV SOLN
2.0000 g | Freq: Once | INTRAVENOUS | Status: AC
  Administered 2022-01-03: 2 g via INTRAVENOUS
  Filled 2022-01-03: qty 50

## 2022-01-03 MED ORDER — METHYLPREDNISOLONE SODIUM SUCC 125 MG IJ SOLR
125.0000 mg | Freq: Once | INTRAMUSCULAR | Status: AC
  Administered 2022-01-03: 125 mg via INTRAVENOUS
  Filled 2022-01-03: qty 2

## 2022-01-03 MED ORDER — FOLIC ACID 1 MG PO TABS
1.0000 mg | ORAL_TABLET | Freq: Every day | ORAL | Status: DC
  Administered 2022-01-03 – 2022-01-05 (×3): 1 mg via ORAL
  Filled 2022-01-03 (×3): qty 1

## 2022-01-03 MED ORDER — HYDROXYZINE HCL 10 MG PO TABS
10.0000 mg | ORAL_TABLET | Freq: Three times a day (TID) | ORAL | Status: DC | PRN
  Administered 2022-01-03 – 2022-01-04 (×2): 10 mg via ORAL
  Filled 2022-01-03 (×3): qty 1

## 2022-01-03 MED ORDER — IOHEXOL 350 MG/ML SOLN
100.0000 mL | Freq: Once | INTRAVENOUS | Status: AC | PRN
  Administered 2022-01-03: 100 mL via INTRAVENOUS

## 2022-01-03 MED ORDER — THIAMINE HCL 100 MG/ML IJ SOLN: 100.0000 mg | Freq: Every day | INTRAMUSCULAR | Status: DC

## 2022-01-03 MED ORDER — UMECLIDINIUM-VILANTEROL 62.5-25 MCG/ACT IN AEPB
1.0000 | INHALATION_SPRAY | Freq: Every day | RESPIRATORY_TRACT | Status: DC
  Administered 2022-01-04 – 2022-01-05 (×2): 1 via RESPIRATORY_TRACT
  Filled 2022-01-03: qty 14

## 2022-01-03 MED ORDER — METOPROLOL TARTRATE 25 MG PO TABS
25.0000 mg | ORAL_TABLET | Freq: Two times a day (BID) | ORAL | Status: DC
  Administered 2022-01-03 – 2022-01-05 (×5): 25 mg via ORAL
  Filled 2022-01-03 (×5): qty 1

## 2022-01-03 MED ORDER — ADULT MULTIVITAMIN W/MINERALS CH
1.0000 | ORAL_TABLET | Freq: Every day | ORAL | Status: DC
  Administered 2022-01-03 – 2022-01-05 (×3): 1 via ORAL
  Filled 2022-01-03 (×3): qty 1

## 2022-01-03 MED ORDER — METOPROLOL TARTRATE 25 MG PO TABS: 25.0000 mg | ORAL_TABLET | Freq: Two times a day (BID) | ORAL | Status: DC

## 2022-01-03 NOTE — Progress Notes (Signed)
2D echocardiogram completed. ? ?01/03/2022 11:42 AM ?Kelby Aline., MHA, RVT, RDCS, RDMS   ?

## 2022-01-03 NOTE — Progress Notes (Signed)
ANTICOAGULATION CONSULT NOTE - Initial Consult ? ?Pharmacy Consult for Heparin ?Indication: atrial fibrillation ? ?Allergies  ?Allergen Reactions  ? Ace Inhibitors Swelling  ? Ampicillin Nausea And Vomiting  ? Penicillins Nausea And Vomiting  ?  Did it involve swelling of the face/tongue/throat, SOB, or low BP? N ?Did it involve sudden or severe rash/hives, skin peeling, or any reaction on the inside of your mouth or nose? N ?Did you need to seek medical attention at a hospital or doctor's office? N ?When did it last happen?   Teenager    ?If all above answers are ?NO?, may proceed with cephalosporin use.  ? Codeine Nausea And Vomiting  ? Lisinopril Swelling  ?  REACTION: angioedema  ? Aspirin   ?  Nose bleeds  ? ? ?Patient Measurements: ?Height: 5\' 11"  (180.3 cm) ?Weight: 65.8 kg (145 lb) ?IBW/kg (Calculated) : 75.3 ? ?Vital Signs: ?Temp: 96.2 ?F (35.7 ?C) (03/13 0539) ?Temp Source: Axillary (03/13 0539) ?BP: 151/81 (03/13 0539) ?Pulse Rate: 92 (03/13 0539) ? ?Labs: ?Recent Labs  ?  01/03/22 ?0530  ?HGB 13.9  ?HCT 42.7  ?PLT 174  ? ? ?CrCl cannot be calculated (Patient's most recent lab result is older than the maximum 21 days allowed.). ? ? ?Medical History: ?Past Medical History:  ?Diagnosis Date  ? Acute blood loss anemia 06/21/2018  ? Acute upper GI bleed 06/21/2018  ? Allergy   ? Bilateral AVN of femurs ([place]) 06/25/2018  ? See 11/2017 Chest CT report  ? COPD 12/21/2006  ? Environmental allergies   ? History of femur fracture 06/25/2018  ? 11/2017 CT AP showed Remote nonunited right femur greater trochanter fracture  ? History of Fracture of right tibia 06/26/2018  ? History of multiple right thoacic rib fractures 06/25/2018  ? History of Right clavicular fracture 06/25/2018  ? HYPERLIPIDEMIA 12/21/2006  ? HYPERTENSION, BENIGN SYSTEMIC 12/21/2006  ? Mallory-Weiss tear   ? TOBACCO DEPENDENCE 12/21/2006  ? ? ?Medications:  ?No current facility-administered medications on file prior to encounter.  ? ?Current Outpatient  Medications on File Prior to Encounter  ?Medication Sig Dispense Refill  ? albuterol (VENTOLIN HFA) 108 (90 Base) MCG/ACT inhaler INHALE 2 PUFFS BY MOUTH EVERY 4 HOURS FOR WHEEZE AND SHORTNESS OF BREATH 8.5 g 2  ? ANORO ELLIPTA 62.5-25 MCG/ACT AEPB INHALE 1 PUFF BY MOUTH EVERY DAY (Patient taking differently: 1 puff daily.) 60 each 3  ? diclofenac Sodium (VOLTAREN) 1 % GEL Apply 2 g topically 4 (four) times daily. (Patient taking differently: Apply 2 g topically 4 (four) times daily as needed (pain).) 100 g 1  ? hydrOXYzine (ATARAX) 10 MG tablet TAKE 1 TABLET(10 MG) BY MOUTH EVERY 8 HOURS AS NEEDED FOR ANXIETY (Patient taking differently: Take 10 mg by mouth every 8 (eight) hours as needed for anxiety.) 30 tablet 0  ? ipratropium-albuterol (DUONEB) 0.5-2.5 (3) MG/3ML SOLN Take 3 mLs by nebulization every 6 (six) hours as needed. (Patient taking differently: Take 3 mLs by nebulization every 6 (six) hours as needed (for wheezing and shortness of breath).) 360 mL 0  ? metoprolol tartrate (LOPRESSOR) 25 MG tablet TAKE 1 TABLET(25 MG) BY MOUTH TWICE DAILY (Patient taking differently: Take 25 mg by mouth 2 (two) times daily.) 180 tablet 1  ? nitroGLYCERIN (NITROSTAT) 0.4 MG SL tablet Place 1 tablet (0.4 mg total) under the tongue every 5 (five) minutes as needed for chest pain. 150 tablet 1  ? OXYGEN Inhale 3 L into the lungs See admin instructions. Continuous at  night, as needed during the day    ? oxymetazoline (AFRIN) 0.05 % nasal spray Place 1 spray into both nostrils 2 (two) times daily as needed for congestion (nose bleeds).    ? predniSONE (DELTASONE) 20 MG tablet 2 tabs po daily x 4 days 8 tablet 0  ? rosuvastatin (CRESTOR) 40 MG tablet Take 40 mg by mouth daily.    ? [DISCONTINUED] loratadine (CLARITIN) 10 MG tablet Take 1 tablet (10 mg total) by mouth daily. 30 tablet 6  ?  ? ?Assessment: ?71 y.o. male with new onset Afib for heparin ?Goal of Therapy:  ?Heparin level 0.3-0.7 units/ml ?Monitor platelets by  anticoagulation protocol: Yes ?  ?Plan:  ?Heparin 3000 units IV bolus, then start heparin 1000 units/hr ?Check heparin level in 8 hours.  ? ?Lylla Eifler, Bronson Curb ?01/03/2022,5:59 AM ? ? ?

## 2022-01-03 NOTE — ED Notes (Signed)
Date and time results received: 01/03/22 8:34 AM ? ?(use smartphrase ".now" to insert current time) ? ?Test: Delta Troponin  ?Critical Value: 71  ? ?Name of Provider Notified: Dr. Berna Spare ? ?Orders Received? Or Actions Taken?:  New orders pending.  ?

## 2022-01-03 NOTE — Plan of Care (Signed)

## 2022-01-03 NOTE — Hospital Course (Signed)
Follow up:   CTA PE: No acute pulmonary embolus. Similar appearance of the spiculated right upper lobe nodular opacity with associated fiducial markers. Lesion appears somewhat more confluent on today's study which could represent evolution of post treatment change but close attention on follow-up recommended.

## 2022-01-03 NOTE — Progress Notes (Signed)
Pt. Did not tolerate the abg procedure and requested RT to stop with the attempt. ?

## 2022-01-03 NOTE — ED Provider Notes (Signed)
71 year old male presenting for COPD exacerbation.  Has received medical treatment and remains on BiPAP.  Also found to have new atrial fibrillation with normal rate.  Will require admission. Physical Exam  BP 121/74   Pulse 87   Temp (!) 96.2 F (35.7 C) (Axillary)   Resp 17   Ht 5\' 11"  (1.803 m)   Wt 65.8 kg   SpO2 96%   BMI 20.22 kg/m   Physical Exam Constitutional:      General: He is not in acute distress.    Appearance: He is not toxic-appearing.  Cardiovascular:     Rate and Rhythm: Normal rate. Rhythm irregular.  Pulmonary:     Comments: On BiPAP, tolerating well Neurological:     Mental Status: He is alert.  Psychiatric:        Mood and Affect: Mood normal.        Behavior: Behavior normal.    Procedures  Procedures  ED Course / MDM    Medical Decision Making Amount and/or Complexity of Data Reviewed Labs: ordered. Radiology: ordered.  Risk Prescription drug management. Decision regarding hospitalization.   Patient remained on BiPAP and was admitted to medicine for further management.       Gloris Manchester, MD 01/04/22 [phone]

## 2022-01-03 NOTE — H&P (Signed)
[facility] Admission History and Physical Service Pager: [phone]  Patient name: Lucas [name] Medical record number: [id number] Date of birth: [birth year] Age: 71 y.o. Gender: male  Primary Care Provider: [name], DO Consultants: Cardiology Code Status: Full code Preferred Emergency Contact: Annamary Carolin [phone]  Chief Complaint: Shortness of breath  Assessment and Plan: Lucas [name] is a 71 y.o. male presenting with shortness of breath. PMH is significant for COPD, HLD, HTN, alcohol abuse, tobacco use.  Shortness of breath COPD Patient woke up this morning with sudden onset worsening shortness of breath.  Is on 3 L home O2 at baseline.  Reports good compliance with his controller medications for COPD as well as regular use of albuterol.  On arrival to the house patient's O2 saturation was 60% on 2 L nasal cannula.  He was placed on a nonrebreather and then CPAP by EMS and his O2 sats improved.  On arrival to the emergency department he was switched to BiPAP and given magnesium as well as Solu-Medrol..  EKG showed atrial fibrillation.  Chest x-ray showed no cardiopulmonary abnormalities.  It showed stable posttreatment changes within the right upper lung.  VBG showing PCO2 of 47.4, normal pH, bicarb of 30.8.  BMP showing mildly low chloride at 96 and elevated glucose at 132 but otherwise normal.  CBC within normal limits.  Flu and COVID test negative.  Differential includes COPD exacerbation although this is more of a sudden onset and patient denies any increased sputum production.  This could also be related to his uncontrolled atrial fibrillation but PE is also in the differential.  Wells PE score of 2.5.  Of note patient has had COPD exacerbation in the past requiring intubation and ICU admission.  Had discussion with the patient regarding CODE STATUS and he is interested in a full code including intubation if needed. - Admit to progressive with Dr. Nori Riis  as attending - Follow-up on CT PE study - Plan for trial off of BiPAP this afternoon  -Wean O2 as able to home O2 needs, O2 goal 88-92% - Continuous pulse ox - Vitals per routine - Continue home Anoro Ellipta daily - DuoNebs every 6 hours as needed - If patient is off BiPAP by tomorrow will initiate prednisone 40 mg daily for a total treatment duration of 5 days - Monitor for signs of infection/worsening signs of COPD exacerbation and consider antibiotics if needed - PT/OT eval and treat  Atrial fibrillation Rising troponins CAD   Hx of NSTEMI   s/p CABG x4 (12/16/2019) Patient had NSTEMI with CABG in 2021.  In November 2021 patient had an EKG performed in the ED showing atrial fibrillation.  More recent EKGs showing normal sinus rhythm.  Home meds include aspirin 81 mg, nitro as needed, metoprolol 25 mg twice daily, Crestor 40 mg daily.  Patient reports intermittent anginal symptoms which are normal for him.  He does report they were little bit worse yesterday.  He has not taken his metoprolol since Saturday.  Initial troponin on arrival was 23, repeat troponin of 71.  Reported vital signs showing controlled rate 80s to low 100s.  On exam patient's heart rate ranges from 110-147 bpm.  CHA2DS2-VASc score of 3.  Heparin drip initiated by ED provider. - Cardiology consulted, appreciate their assistance in caring for this patient - Continuous cardiac monitoring - Continue to trend troponins until stabilized - Patient given 1 dose IV metoprolol 2.5 mg, will await cardiology recommendations regarding further rate control -  Echocardiogram ordered - Patient currently on heparin  History of squamous cell carcinoma, RUL, s/p SBRT Patient has been following with radiology oncology outpatient.  He was scheduled to have follow-up CT performed in November but it does not appear that that was completed.   -CT PE study pending - Patient will need to follow-up with radiology/oncology after  discharge  Alcohol abuse   liver cirrhosis   history of Mallory-Weiss tear Patient reports drinking a sixpack of beers daily.  In past hospitalizations patient has required Ativan for alcohol withdrawal symptoms. - CIWA protocol initiated - Please notify team for CIWA's above 5 and determination committee made regarding Ativan needs - Thiamine supplementation - Folic acid supplementation - Morning CMP  HTN Blood pressure 117/73-153/76 since admission.  Home medication includes metoprolol 25 mg twice daily.  He has not taken this in approximately 2 days. - 1 dose of 2.5 mg metoprolol IV ordered - Monitor blood pressures and may restart home medications once off BiPAP  HLD Home medications include Crestor 40 mg daily.  Most recent lipid panel was done 12/15/2019.  Total cholesterol 168, LDL 87, triglycerides 28, HDL 75. - Continue home Crestor - Repeat lipid panel ordered for labs tomorrow  Tobacco abuse 1 PPD since the patient was approximately 71 years old. -Consider nicotine patch once patient is more stable  FEN/GI: N.p.o. while on BiPAP Prophylaxis: Heparin  Disposition: Admit to progressive with Dr. Gwendlyn Deutscher as attending  History of Present Illness:  Lucas [name] is a 71 y.o. male presenting with shortness of breath.  He has had difficulty breathing for a long time and is on 3 L nasal cannula O2 at home at baseline.  He reports yesterday was a normal day for him and on normal days he takes his controller medication as well as has to use his albuterol at minimum twice daily.  He woke up this morning with acute shortness of breath so called EMS.  He reports that when he has bad days with his COPD he will be constantly using his albuterol.  Review Of Systems: Per HPI with the following additions:   Review of Systems  Constitutional:  Positive for fatigue. Negative for activity change, chills and fever.  HENT:  Negative for congestion and rhinorrhea.   Eyes:  Negative for visual  disturbance.  Respiratory:  Positive for cough, shortness of breath and wheezing.   Cardiovascular:  Positive for chest pain and palpitations.  Gastrointestinal:  Positive for nausea. Negative for abdominal pain, diarrhea and vomiting.  Endocrine: Negative for polyuria.  Genitourinary:  Positive for difficulty urinating.  Musculoskeletal:  Positive for myalgias.  Neurological:  Positive for numbness. Negative for dizziness, weakness and headaches.    Patient Active Problem List   Diagnosis Date Noted   Anxiety 12/31/2020   Neck pain 12/31/2020   COPD (chronic obstructive pulmonary disease) ([place]) 09/28/2020   Protein-calorie malnutrition, severe 09/09/2020   COPD with acute exacerbation ([place]) 09/07/2020   Malignant neoplasm of right upper lobe of lung ([place]) 05/26/2020   Lung mass 05/04/2020   Acute on chronic respiratory failure with hypoxia ([place]) 05/02/2020   Pulmonary nodule 02/10/2020   Pleural effusion, right    S/P CABG x 4 12/16/2019   Coronary artery disease 12/16/2019   Unstable angina ([place]) 12/12/2019   NSTEMI (non-ST elevated myocardial infarction) ([place]) 12/12/2019   CAD (coronary artery disease) 07/11/2018   H. pylori infection 07/03/2018   Chronic stable angina ([place]) 07/03/2018   Duodenal ulcer due to Helicobacter  pylori    Bilateral AVN of femurs ([place]) 06/25/2018   Chronic cough 06/25/2018   Mallory-Weiss tear    Gastritis and gastroduodenitis    Cirrhosis of liver ([place])    Tubular adenoma of colon 01/24/2017   Epistaxis 01/01/2015   COPD exacerbation ([place]) 02/13/2013   Ganglion cyst 01/03/2012   PERIPHERAL NEUROPATHY 07/15/2010   Alcohol use disorder, severe, dependence ([place]) 05/14/2009   DEFORMITY, FOOT NEC, CONGENITAL 08/22/2007   ALLERGIC RHINITIS, SEASONAL 05/03/2007   Hyperlipidemia 12/21/2006   Tobacco abuse 12/21/2006   COPD with chronic bronchitis ([place]) 12/21/2006    Past Medical History: Past Medical History:  Diagnosis Date   Acute blood loss  anemia 06/21/2018   Acute upper GI bleed 06/21/2018   Allergy    Bilateral AVN of femurs ([place]) 06/25/2018   See 11/2017 Chest CT report   COPD 12/21/2006   Environmental allergies    History of femur fracture 06/25/2018   11/2017 CT AP showed Remote nonunited right femur greater trochanter fracture   History of Fracture of right tibia 06/26/2018   History of multiple right thoacic rib fractures 06/25/2018   History of Right clavicular fracture 06/25/2018   HYPERLIPIDEMIA 12/21/2006   HYPERTENSION, BENIGN SYSTEMIC 12/21/2006   Mallory-Weiss tear    TOBACCO DEPENDENCE 12/21/2006    Past Surgical History: Past Surgical History:  Procedure Laterality Date   ANKLE SURGERY     left ankle   APPLICATION OF WOUND VAC  01/06/2012   Procedure: APPLICATION OF WOUND VAC;  Surgeon: Meredith Pel, MD;  Location: [facility];  Service: Orthopedics;  Laterality: Left;   APPLICATION OF WOUND VAC Right 10/17/2015   Procedure: APPLICATION OF WOUND VAC;  Surgeon: Leandrew Koyanagi, MD;  Location: [facility];  Service: Orthopedics;  Laterality: Right;   BIOPSY  06/22/2018   Procedure: BIOPSY;  Surgeon: Dorothea Ogle v, DO;  Location: [facility];  Service: Gastroenterology;;   BRONCHIAL BIOPSY  05/04/2020   Procedure: BRONCHIAL BIOPSIES;  Surgeon: Garner Nash, DO;  Location: [facility];  Service: Pulmonary;;   BRONCHIAL BRUSHINGS  05/04/2020   Procedure: BRONCHIAL BRUSHINGS;  Surgeon: Garner Nash, DO;  Location: [facility];  Service: Pulmonary;;   BRONCHIAL NEEDLE ASPIRATION BIOPSY  05/04/2020   Procedure: BRONCHIAL NEEDLE ASPIRATION BIOPSIES;  Surgeon: Garner Nash, DO;  Location: [facility];  Service: Pulmonary;;   BRONCHIAL WASHINGS  05/04/2020   Procedure: BRONCHIAL WASHINGS;  Surgeon: Garner Nash, DO;  Location: [facility];  Service: Pulmonary;;   CORONARY ARTERY BYPASS GRAFT N/A 12/16/2019   Procedure: CORONARY ARTERY BYPASS GRAFTING (CABG) TIMES 4 USING LEFT INTERNAL MAMMARY ARTERY AND  ENDOSCOPICALLY HARVESTED RIGHT SAPHENOUS VEIN;  Surgeon: Lajuana Matte, MD;  Location: [facility];  Service: Open Heart Surgery;  Laterality: N/A;   ESOPHAGOGASTRODUODENOSCOPY (EGD) WITH PROPOFOL N/A 06/22/2018   Procedure: ESOPHAGOGASTRODUODENOSCOPY (EGD) WITH PROPOFOL;  Surgeon: [id number] v, DO;  Location: [facility];  Service: Gastroenterology;  Laterality: N/A;   EXTERNAL FIXATION LEG  01/06/2012   Procedure: EXTERNAL FIXATION LEG;  Surgeon: Meredith Pel, MD;  [facility];  Service: Orthopedics;  Laterality: Left;  Smith-nephew external fixator set   EXTERNAL FIXATION LEG Right 09/16/2015   Procedure: EXTERNAL FIXATION LEG/LARGE;  Surgeon: Mcarthur Rossetti, MD;  Location: [facility];  Service: Orthopedics;  Laterality: Right;   EXTERNAL FIXATION REMOVAL Right 09/22/2015   Procedure: REMOVAL EXTERNAL FIXATION LEG;  Surgeon: Mcarthur Rossetti, MD;  Location: [facility];  Service: Orthopedics;  Laterality: Right;   FASCIOTOMY  01/10/2012   Procedure: FASCIOTOMY;  Surgeon: Rozanna Box, MD;  Location: [facility];  Service: Orthopedics;  Laterality: Left;  status post fasciotomy with wound vac left calf; adjustment external fixator left tibia under fluoro; retention suture/wound vac placement left lateral calf wound   FASCIOTOMY  01/06/2012   Procedure: FASCIOTOMY;  Surgeon: Meredith Pel, MD;  [facility];  Service: Orthopedics;  Laterality: Left;  Four compartment fasciotomy   FIDUCIAL MARKER PLACEMENT  05/04/2020   Procedure: FIDUCIAL MARKER PLACEMENT;  Surgeon: Garner Nash, DO;  Location: [facility];  Service: Pulmonary;;   HOT HEMOSTASIS N/A 06/22/2018   Procedure: HEMOSTASIS;  Surgeon: Dorothea Ogle v, DO;  Location: [facility];  Service: Gastroenterology;  Laterality: N/A;  Epinepherine and clip placed   I & D EXTREMITY  01/12/2012   Procedure: IRRIGATION AND DEBRIDEMENT EXTREMITY;  Surgeon: Rozanna Box, MD;  Location: [facility];  Service: Orthopedics;   Laterality: Left;  LAYERD CLOSURE LEFT LEG WOUND   I & D EXTREMITY Right 10/17/2015   Procedure: IRRIGATION AND DEBRIDEMENT EXTREMITY;  Surgeon: Leandrew Koyanagi, MD;  Location: [facility];  Service: Orthopedics;  Laterality: Right;   I & D EXTREMITY Right 10/20/2015   Procedure: REPEAT IRRIGATION AND DEBRIDEMENT EXTREMITY, right lower leg;  Surgeon: Mcarthur Rossetti, MD;  Location: [facility];  Service: Orthopedics;  Laterality: Right;   KNEE SURGERY     left knee and right knee   LEFT HEART CATH AND CORONARY ANGIOGRAPHY N/A 06/27/2018   Procedure: LEFT HEART CATH AND CORONARY ANGIOGRAPHY;  Surgeon: Martinique, Peter M, MD;  Location: [facility];  Service: Cardiovascular;  Laterality: N/A;   LEFT HEART CATH AND CORONARY ANGIOGRAPHY N/A 12/12/2019   Procedure: LEFT HEART CATH AND CORONARY ANGIOGRAPHY;  Surgeon: [place] Hampshire, MD;  Location: [facility];  Service: Cardiovascular;  Laterality: N/A;   MASS EXCISION Left 09/22/2015   Procedure: EXCISION MASS;  Surgeon: Mcarthur Rossetti, MD;  Location: [facility];  Service: Orthopedics;  Laterality: Left;   ORIF TIBIA PLATEAU  02/07/2012   Procedure: OPEN REDUCTION INTERNAL FIXATION (ORIF) TIBIAL PLATEAU;  Surgeon: Rozanna Box, MD;  Location: [facility];  Service: Orthopedics;  Laterality: Left;  ORIF LEFT TIBIAL PLATEAU/ REMOVE EXTERNAL FIXATION LEFT LEG   ORIF TIBIA PLATEAU Right 09/22/2015   Procedure: REMOVAL OF EXTERNAL FIXATOR RIGHT LEG, OPEN REDUCTION INTERNAL FIXATION (ORIF) RIGHT TIBIAL PLATEAU, EXCISION SMALL MASS LEFT LEG;  Surgeon: Mcarthur Rossetti, MD;  Location: [facility];  Service: Orthopedics;  Laterality: Right;   SKIN SPLIT GRAFT Right 10/23/2015   Procedure: RIGHT GASTROC FLAP SPLIT THICKNESS SKIN GRAFT FROM RIGHT THING TO RIGHT LEG;  Surgeon: Irene Limbo, MD;  Location: [facility];  Service: Plastics;  Laterality: Right;   TEE WITHOUT CARDIOVERSION N/A 12/16/2019   Procedure: TRANSESOPHAGEAL ECHOCARDIOGRAM (TEE);  Surgeon:  Lajuana Matte, MD;  Location: [facility];  Service: Open Heart Surgery;  Laterality: N/A;   VIDEO BRONCHOSCOPY WITH ENDOBRONCHIAL NAVIGATION Right 05/04/2020   Procedure: VIDEO BRONCHOSCOPY WITH ENDOBRONCHIAL NAVIGATION;  Surgeon: Garner Nash, DO;  Location: [facility];  Service: Pulmonary;  Laterality: Right;   WRIST SURGERY     left    Social History: Social History   Tobacco Use   Smoking status: Every Day    Packs/day: 0.50    Years: 51.00    Pack years: 25.50    Types: Cigarettes   Smokeless tobacco: Former    Types: Snuff, Chew    Quit  date: 01/05/1965   Tobacco comments:    currently half a pack a day-05/26/2020  Vaping Use   Vaping Use: Never used  Substance Use Topics   Alcohol use: Yes    Alcohol/week: 6.0 standard drinks    Types: 6 Cans of beer per week    Comment: drinks about a 6-pack/day   Drug use: No   Additional social history:   Please also refer to relevant sections of EMR.  Family History: Family History  Problem Relation Age of Onset   Emphysema Father     Allergies and Medications: Allergies  Allergen Reactions   Ace Inhibitors Swelling   Ampicillin Nausea And Vomiting   Penicillins Nausea And Vomiting    Did it involve swelling of the face/tongue/throat, SOB, or low BP? N Did it involve sudden or severe rash/hives, skin peeling, or any reaction on the inside of your mouth or nose? N Did you need to seek medical attention at a hospital or doctor's office? N When did it last happen?   Teenager    If all above answers are NO, may proceed with cephalosporin use.   Codeine Nausea And Vomiting   Lisinopril Swelling    REACTION: angioedema   Aspirin     Nose bleeds   No current facility-administered medications on file prior to encounter.   Current Outpatient Medications on File Prior to Encounter  Medication Sig Dispense Refill   albuterol (VENTOLIN HFA) 108 (90 Base) MCG/ACT inhaler INHALE 2 PUFFS BY MOUTH EVERY 4 HOURS FOR  WHEEZE AND SHORTNESS OF BREATH 8.5 g 2   ANORO ELLIPTA 62.5-25 MCG/ACT AEPB INHALE 1 PUFF BY MOUTH EVERY DAY (Patient taking differently: 1 puff daily.) 60 each 3   diclofenac Sodium (VOLTAREN) 1 % GEL Apply 2 g topically 4 (four) times daily. (Patient taking differently: Apply 2 g topically 4 (four) times daily as needed (pain).) 100 g 1   hydrOXYzine (ATARAX) 10 MG tablet TAKE 1 TABLET(10 MG) BY MOUTH EVERY 8 HOURS AS NEEDED FOR ANXIETY (Patient taking differently: Take 10 mg by mouth every 8 (eight) hours as needed for anxiety.) 30 tablet 0   ipratropium-albuterol (DUONEB) 0.5-2.5 (3) MG/3ML SOLN Take 3 mLs by nebulization every 6 (six) hours as needed. (Patient taking differently: Take 3 mLs by nebulization every 6 (six) hours as needed (for wheezing and shortness of breath).) 360 mL 0   metoprolol tartrate (LOPRESSOR) 25 MG tablet TAKE 1 TABLET(25 MG) BY MOUTH TWICE DAILY (Patient taking differently: Take 25 mg by mouth 2 (two) times daily.) 180 tablet 1   nitroGLYCERIN (NITROSTAT) 0.4 MG SL tablet Place 1 tablet (0.4 mg total) under the tongue every 5 (five) minutes as needed for chest pain. 150 tablet 1   OXYGEN Inhale 3 L into the lungs See admin instructions. Continuous at night, as needed during the day     oxymetazoline (AFRIN) 0.05 % nasal spray Place 1 spray into both nostrils 2 (two) times daily as needed for congestion (nose bleeds).     predniSONE (DELTASONE) 20 MG tablet 2 tabs po daily x 4 days 8 tablet 0   rosuvastatin (CRESTOR) 40 MG tablet Take 40 mg by mouth daily.     [DISCONTINUED] loratadine (CLARITIN) 10 MG tablet Take 1 tablet (10 mg total) by mouth daily. 30 tablet 6    Objective: BP (!) 153/76    Pulse (!) 124    Temp (!) 96.2 F (35.7 C) (Axillary)    Resp 15    Ht  5\' 11"  (1.803 m)    Wt 65.8 kg    SpO2 98%    BMI 20.22 kg/m  Physical Exam Vitals reviewed.  Constitutional:      Appearance: He is ill-appearing.  HENT:     Head: Normocephalic and atraumatic.  Eyes:      Pupils: Pupils are equal, round, and reactive to light.     Comments: Patient has horizontal nystagmus which she reports he always has.  Neck:     Vascular: No JVD.  Cardiovascular:     Rate and Rhythm: Tachycardia present. Rhythm irregular.     Pulses: Normal pulses.  Pulmonary:     Effort: Respiratory distress present.     Breath sounds: Decreased breath sounds (Throughout) present. No wheezing, rhonchi or rales.  Chest:     Chest wall: No tenderness or edema.  Abdominal:     General: Bowel sounds are normal.     Palpations: Abdomen is soft. There is no hepatomegaly.  Musculoskeletal:        General: Normal range of motion.     Cervical back: Normal range of motion and neck supple.     Right lower leg: No tenderness. No edema.     Left lower leg: No tenderness. No edema.  Lymphadenopathy:     Cervical: No cervical adenopathy.  Skin:    General: Skin is warm and dry.     Capillary Refill: Capillary refill takes less than 2 seconds.  Neurological:     General: No focal deficit present.     Mental Status: He is alert and oriented to person, place, and time.     Cranial Nerves: No cranial nerve deficit.  Psychiatric:        Mood and Affect: Mood normal.     Labs and Imaging: CBC BMET  Recent Labs  Lab 01/03/22 0530 01/03/22 0810  WBC 7.0  --   HGB 13.9 13.9  HCT 42.7 41.0  PLT 174  --    Recent Labs  Lab 01/03/22 0530 01/03/22 0810  NA 139 135  K 4.6 4.2  CL 96*  --   CO2 29  --   BUN 8  --   CREATININE 1.03  --   GLUCOSE 132*  --   CALCIUM 9.6  --      EKG: Atrial fibrillation, ventricular rate of 98  DG Chest Port 1 View  Result Date: 01/03/2022 CLINICAL DATA:  Shortness of breath. EXAM: PORTABLE CHEST 1 VIEW COMPARISON:  11/26/2021 FINDINGS: Heart size and mediastinal contours are stable. Previous median sternotomy and CABG procedure. Post treatment changes within the right upper lung are identified in the region of fiducial markers. Chronic blunting  of the right costophrenic angle is identified likely reflecting pleuroparenchymal scarring. No superimposed interstitial or airspace opacities. IMPRESSION: 1. No acute cardiopulmonary abnormalities. 2. Stable post treatment changes within the right upper lung. Electronically Signed   By: Kerby Moors M.D.   On: 01/03/2022 06:00     Gifford Shave, MD 01/03/2022, 8:50 AM PGY-3, [facility] pager: [phone], text pages welcome

## 2022-01-03 NOTE — Progress Notes (Addendum)
RT was given in report by nightshift RT that patient refused ABG. ?

## 2022-01-03 NOTE — Evaluation (Signed)
Physical Therapy Evaluation ?Patient Details ?Name: Lucas [name] ?MRN: [id number] ?DOB: 1951/02/10 ?Today's Date: 01/03/2022 ? ?History of Present Illness ? 71 y/o male with presents with SOB and excesive coughing secondary to COPD exacerbation. He reported being out of his medications, including beta-blocker. At baseline, he uses 3 L O2.  PMH of AVN femur, COPD, HLD, HTN, CAD, Tobacco abuse, ETOH abuse  ?Clinical Impression ? Pt had no isolated strength deficits during MMT testing. He was able to perform sit to stand transfer with supervision assist. He exhibited diminished endurance with static standing and walking require increased O2 and UE support on IV pole/rolling walker. During physical exam patient showed deficits in endurance and activity tolerance consistent with COPD exacerbation. Recommending therapy services at [facility] to address the previously stated deficits and ensure safety with pt home set up. Will continue to follow acutely to maximize functional mobility, independence, and safety. ?  ? ?Recommendations for follow up therapy are one component of a multi-disciplinary discharge planning process, led by the attending physician.  Recommendations may be updated based on patient status, additional functional criteria and insurance authorization. ? ?Follow Up Recommendations Home health PT ? ?  ?Assistance Recommended at Discharge Intermittent Supervision/Assistance  ?Patient can return home with the following ? A little help with walking and/or transfers;A little help with bathing/dressing/bathroom;Assistance with cooking/housework;Assist for transportation;Help with stairs or ramp for entrance;Direct supervision/assist for medications management ? ?  ?Equipment Recommendations None recommended by PT  ?Recommendations for Other Services ?    ?  ?Functional Status Assessment Patient has had a recent decline in their functional status and demonstrates the ability to make significant improvements in  function in a reasonable and predictable amount of time.  ? ?  ?Precautions / Restrictions Precautions ?Precautions: None ?Restrictions ?Weight Bearing Restrictions: No  ? ?  ? ?Mobility ? Bed Mobility ?Overal bed mobility: Modified Independent ?  ?  ?  ?  ?  ?  ?General bed mobility comments: Increased time for bed mobility. ?  ? ?Transfers ?Overall transfer level: Modified independent ?  ?  ?  ?  ?  ?  ?  ?  ?  ?  ? ?Ambulation/Gait ?Ambulation/Gait assistance: Min assist ?Gait Distance (Feet): 100 Feet (Pt took standing breaks throughout walk) ?Assistive device: Rolling walker (2 wheels) ?Gait Pattern/deviations: Step-through pattern, Decreased stride length, Trunk flexed ?  ?  ?  ?General Gait Details: Pt was shaking with walking ambulation and anxious affect increased at first. Pt had decreased anxiety with walker. ? ?Stairs ?  ?  ?  ?  ?  ? ?Wheelchair Mobility ?  ? ?Modified Rankin (Stroke Patients Only) ?  ? ?  ? ?Balance Overall balance assessment: Needs assistance ?Sitting-balance support: Bilateral upper extremity supported, Feet unsupported ?Sitting balance-Leahy Scale: Fair ?Sitting balance - Comments: Pt was modified independent sitting edge of stretcher ?  ?Standing balance support: No upper extremity supported ?Standing balance-Leahy Scale: Fair ?Standing balance comment: Pt was able to stand statically for 1 minute with supervision but then needed a break due to fatigue. During ambulation he required UE support on a walker to maintain stability. ?  ?  ?  ?  ?  ?  ?  ?  ?  ?  ?  ?   ? ? ? ?Pertinent Vitals/Pain Pain Assessment ?Pain Assessment: No/denies pain  ? ? ?Home Living Family/patient expects to be discharged to:: Private residence ?Living Arrangements: Spouse/significant other ?Available Help at Discharge: Family;Available 24 hours/day ?Type  of Home: Apartment ?Home Access: Stairs to enter ?Entrance Stairs-Rails: Left ?Entrance Stairs-Number of Steps: 2 ?  ?Home Layout: One level ?Home  Equipment: Conservation officer, nature (2 wheels);Cane - single point;Shower seat;Crutches ?Additional Comments: Oxygen at home PRN  ?  ?Prior Function Prior Level of Function : Independent/Modified Independent ?  ?  ?  ?  ?  ?  ?Mobility Comments: Wife will occasionlly help him around the house but does not require consistant help ?  ?  ? ? ?Hand Dominance  ? Dominant Hand: Right ? ?  ?Extremity/Trunk Assessment  ?   ?  ? ?Lower Extremity Assessment ?Lower Extremity Assessment: Overall WFL for tasks assessed ?  ? ?   ?Communication  ? Communication: No difficulties  ?Cognition Arousal/Alertness: Awake/alert ?Behavior During Therapy: Anxious ?Overall Cognitive Status: Within Functional Limits for tasks assessed ?  ?  ?  ?  ?  ?  ?  ?  ?  ?  ?  ?  ?  ?  ?  ?  ?  ?  ?  ? ?  ?General Comments General comments (skin integrity, edema, etc.): Pt was on 3L of O2 but his O2 saturation dropped into the upper 80s and was increased to 4 L. Pt required 4L of supplemental O2 to ambulate in the hallway.   pt reported that he has DME and does not like to rely on it. He was educated on proper use of DME and that he should utilize it when he needs so he can maintain maximum safety. ? ?  ?Exercises    ? ?Assessment/Plan  ?  ?PT Assessment Patient needs continued PT services  ?PT Problem List Decreased activity tolerance;Decreased balance;Decreased knowledge of use of DME;Cardiopulmonary status limiting activity ? ?   ?  ?PT Treatment Interventions DME instruction;Gait training;Stair training;Functional mobility training;Patient/family education;Therapeutic activities;Neuromuscular re-education;Balance training;Therapeutic exercise   ? ?PT Goals (Current goals can be found in the Care Plan section)  ?Acute Rehab PT Goals ?Patient Stated Goal: Maintain independance ?PT Goal Formulation: With patient ?Time For Goal Achievement: 01/17/22 ?Potential to Achieve Goals: Fair ? ?  ?Frequency Min 3X/week ?  ? ? ?Co-evaluation   ?  ?  ?  ?  ? ? ?  ?AM-PAC  PT "6 Clicks" Mobility  ?Outcome Measure Help needed turning from your back to your side while in a flat bed without using bedrails?: None ?Help needed moving from lying on your back to sitting on the side of a flat bed without using bedrails?: None ?Help needed moving to and from a bed to a chair (including a wheelchair)?: None ?Help needed standing up from a chair using your arms (e.g., wheelchair or bedside chair)?: A Little ?Help needed to walk in hospital room?: A Little ?Help needed climbing 3-5 steps with a railing? : Total ?6 Click Score: 19 ? ?  ?End of Session Equipment Utilized During Treatment: Gait belt;Oxygen (4L) ?Activity Tolerance: Patient tolerated treatment well ?Patient left: in bed;with call bell/phone within reach (On stretcher in ED) ?  ?PT Visit Diagnosis: Unsteadiness on feet (R26.81);Difficulty in walking, not elsewhere classified (R26.2) ?  ? ?Time: 1625-1700 ?PT Time Calculation (min) (ACUTE ONLY): 35 min ? ? ?Charges:   PT Evaluation ?$PT Eval Moderate Complexity: 1 Mod ?PT Treatments ?$Gait Training: 8-22 mins ?  ?   ? ? ?Quenton Fetter, SPT ? ? ?Quenton Fetter ?01/03/2022, 6:03 PM ?

## 2022-01-03 NOTE — Progress Notes (Addendum)
ANTICOAGULATION CONSULT NOTE ? ?Pharmacy Consult for Heparin ?Indication: atrial fibrillation ? ?Allergies  ?Allergen Reactions  ? Ace Inhibitors Swelling  ? Ampicillin Nausea And Vomiting  ? Penicillins Nausea And Vomiting  ?  Did it involve swelling of the face/tongue/throat, SOB, or low BP? N ?Did it involve sudden or severe rash/hives, skin peeling, or any reaction on the inside of your mouth or nose? N ?Did you need to seek medical attention at a hospital or doctor's office? N ?When did it last happen?   Teenager    ?If all above answers are ?NO?, may proceed with cephalosporin use.  ? Codeine Nausea And Vomiting  ? Lisinopril Swelling  ?  REACTION: angioedema  ? Aspirin   ?  Nose bleeds  ? ? ?Patient Measurements: ?Height: 5\' 11"  (180.3 cm) ?Weight: 65.8 kg (145 lb) ?IBW/kg (Calculated) : 75.3 ? ?Vital Signs: ?Temp: 98 ?F (36.7 ?C) (03/13 1400) ?Temp Source: Oral (03/13 1400) ?BP: 127/73 (03/13 1515) ?Pulse Rate: 98 (03/13 1515) ? ?Labs: ?Recent Labs  ?  01/03/22 ?0530 01/03/22 ?0739 01/03/22 ?0254 01/03/22 ?1026 01/03/22 ?1251 01/03/22 ?1402  ?HGB 13.9  --  13.9  --   --   --   ?HCT 42.7  --  41.0  --   --   --   ?PLT 174  --   --   --   --   --   ?HEPARINUNFRC  --   --   --   --   --  0.28*  ?CREATININE 1.03  --   --   --   --   --   ?TROPONINIHS 23* 71*  --  77* 48*  --   ? ? ? ?Estimated Creatinine Clearance: 61.2 mL/min (by C-G formula based on SCr of 1.03 mg/dL). ? ?Assessment: ?65 yom with hx PAF who presented to the ED with SOB and found to be in Afib with RVR. Pharmacy consulted to begin IV heparin. ? ?Heparin level is below goal at 0.28 on 1000 units/hr. No bleeding noted, CBC is normal. No problems noted by RN. ? ?Goal of Therapy:  ?Heparin level 0.3-0.7 units/ml ?Monitor platelets by anticoagulation protocol: Yes ?  ?Plan:  ?Increase heparin drip to 1100 units/hr ?Heparin level with am labs ?Daily heparin level, CBC ?Monitor for s/sx of bleeding ?F/U decision re long term AC ? ?Thank you for  involving pharmacy in this patient's care. ? ?Renold Genta, PharmD, BCPS ?Clinical Pharmacist ?Clinical phone for 01/03/2022 until 3p is [phone] ?01/03/2022 3:37 PM ? ?**Pharmacist phone directory can be found on [email or web address] listed under [place]** ? ? ? ?

## 2022-01-03 NOTE — Consult Note (Signed)
Cardiology Consultation:   Patient ID: Lucas [name] MRN: [id number]; DOB: Jun 13, 1951  Admit date: 01/03/2022 Date of Consult: 01/03/2022  PCP:  Lucas [name], [place] Prov[facility] Cardiologist:  Lucas Martinique, MD  Cardiology APP:  Lucas [name], [place]       Patient Profile:   Lucas [name] is a 71 y.o. male with a hx of HTN, HLD, ETOH and tobacco abuse, COPD-on home 02, CAD-CABG X 4 2021, mallory-Weiss tear 2019 who is being seen 01/03/2022 for the evaluation of atrial fib at the request of Dr. Gwendlyn [name] .  History of Present Illness:   Lucas [name] with above hx and cardiac cath 2019 for chest pain with 35% proximal RCA, 85% and 50% sequential lesion in mid RCA, 70% ostial left circumflex, 65% left main/proximal LAD, occluded D2.  The LAD and left circumflex lesion was not amenable to PCI and were not critical.  The mid RCA lesion was more severe however would require atherectomy and stenting though with ETOH use and Mallory-Weiss tear -medical therapy was planned.    11/2019 with NSTEMI with severe CAD  80% distal left main to proximal LAD, 100% occluded ostial D2, 85% ostial left circumflex lesion, 60% proximal RCA, 99% mid RCA, 95% proximal to mid left circumflex lesion, 60% ostial D1, EF 50 to 55%.  Echocardiogram obtained on the same day showed EF 50 to 55%, mild MR.   and pt underwent CABG X 4 LIMA-LAD, SVG-PDA, SVG-D1, and SVG-OM2.  Noted during the hospitalization, patient had CT angiogram of the chest that demonstrated new 11 mm spiculated nodule in the right upper lobe.  Given the history of smoking, this likely represent primary bronchogenic carcinoma.  last cards visit 04/2020.  Appears he had radiation therapy last year.     Today presents to ER by EMS with severe hypoxia sp02 at 60% on 2 L [place], he eventually went CPAP with improvement.  (Woke suddenly with SOB) In ER he was switched to BiPAP.    He is found to be in atrial fib with rate 98, no a fib listed in hx but  09/06/20 EKG  with a fib on admit for resp failure with COPD exacerbation. No note of a fib seen in notes and no anticoagulation mentioned.  Follow up EKGs since that admit with SR.    Labs Na 138, k+ 4.2 Cr 0.72  BNP 146 Hs troponin 23, 71  and 77 Hgb 12.1 WBC 4.9 plts 170  Neg COVID Mg+ 2.3   EKG:  The EKG was personally reviewed and demonstrates:  atrial fib with HR 98 and LVH  Telemetry:  Telemetry was personally reviewed and demonstrates:  now in SR  PCXR FINDINGS: Cardiomediastinal silhouette is unchanged. Heart size is normal. Cardiac surgical changes and median sternotomy wires. Pulmonary vasculature is normal. Stable appearing spiculated irregular chronic opacities in the right upper lung near the apex. Chronic linear likely scarring opacities at the left lung base. No new consolidation identified. No significant pleural effusion or pneumothorax visualized.  IMPRESSION: Stable appearing chronic changes with no acute process identified.  CTA of chest neg PE, Similar appearance of the spiculated right upper lobe nodular opacity with associated fiducial markers. Lesion appears somewhat more confluent on today's study which could represent evolution of post treatment change but close attention on follow-up recommended.  He denies awareness of rapid HR.  He has weakness with or without oxygen that occurs more freq.  If he goes slow he does better.  No awareness of blood in stool or urine.      Past Medical History:  Diagnosis Date   Acute blood loss anemia 06/21/2018   Acute upper GI bleed 06/21/2018   Allergy    Bilateral AVN of femurs ([place]) 06/25/2018   See 11/2017 Chest CT report   COPD 12/21/2006   Environmental allergies    History of femur fracture 06/25/2018   11/2017 CT AP showed Remote nonunited right femur greater trochanter fracture   History of Fracture of right tibia 06/26/2018   History of multiple right thoacic rib fractures 06/25/2018   History of Right clavicular fracture  06/25/2018   HYPERLIPIDEMIA 12/21/2006   HYPERTENSION, BENIGN SYSTEMIC 12/21/2006   Mallory-Weiss tear    TOBACCO DEPENDENCE 12/21/2006    Past Surgical History:  Procedure Laterality Date   ANKLE SURGERY     left ankle   APPLICATION OF WOUND VAC  01/06/2012   Procedure: APPLICATION OF WOUND VAC;  Surgeon: Meredith Pel, MD;  Location: [facility];  Service: Orthopedics;  Laterality: Left;   APPLICATION OF WOUND VAC Right 10/17/2015   Procedure: APPLICATION OF WOUND VAC;  Surgeon: Leandrew Koyanagi, MD;  Location: [place];  Service: Orthopedics;  Laterality: Right;   BIOPSY  06/22/2018   Procedure: BIOPSY;  Surgeon: Dorothea Ogle v, DO;  Location: [facility];  Service: Gastroenterology;;   BRONCHIAL BIOPSY  05/04/2020   Procedure: BRONCHIAL BIOPSIES;  Surgeon: Garner Nash, DO;  Location: [facility];  Service: Pulmonary;;   BRONCHIAL BRUSHINGS  05/04/2020   Procedure: BRONCHIAL BRUSHINGS;  Surgeon: Garner Nash, DO;  Location: [facility];  Service: Pulmonary;;   BRONCHIAL NEEDLE ASPIRATION BIOPSY  05/04/2020   Procedure: BRONCHIAL NEEDLE ASPIRATION BIOPSIES;  Surgeon: Garner Nash, DO;  Location: [facility];  Service: Pulmonary;;   BRONCHIAL WASHINGS  05/04/2020   Procedure: BRONCHIAL WASHINGS;  Surgeon: Garner Nash, DO;  Location: [place] [facility];  Service: Pulmonary;;   CORONARY ARTERY BYPASS GRAFT N/A 12/16/2019   Procedure: CORONARY ARTERY BYPASS GRAFTING (CABG) TIMES 4 USING LEFT INTERNAL MAMMARY ARTERY AND ENDOSCOPICALLY HARVESTED RIGHT SAPHENOUS VEIN;  Surgeon: Lajuana Matte, MD;  Location: [place];  Service: Open Heart Surgery;  Laterality: N/A;   ESOPHAGOGASTRODUODENOSCOPY (EGD) WITH PROPOFOL N/A 06/22/2018   Procedure: ESOPHAGOGASTRODUODENOSCOPY (EGD) WITH PROPOFOL;  Surgeon: [id number] v, DO;  Location: [facility];  Service: Gastroenterology;  Laterality: N/A;   EXTERNAL FIXATION LEG  01/06/2012   Procedure: EXTERNAL FIXATION LEG;  Surgeon: Meredith Pel, MD;  [facility];  Service: Orthopedics;  Laterality: Left;  Smith-nephew external fixator set   EXTERNAL FIXATION LEG Right 09/16/2015   Procedure: EXTERNAL FIXATION LEG/LARGE;  Surgeon: Mcarthur Rossetti, MD;  Location: [facility];  Service: Orthopedics;  Laterality: Right;   EXTERNAL FIXATION REMOVAL Right 09/22/2015   Procedure: REMOVAL EXTERNAL FIXATION LEG;  Surgeon: Mcarthur Rossetti, MD;  Location: [place];  Service: Orthopedics;  Laterality: Right;   FASCIOTOMY  01/10/2012   Procedure: FASCIOTOMY;  Surgeon: Rozanna Box, MD;  Location: [employer];  Service: Orthopedics;  Laterality: Left;  status post fasciotomy with wound vac left calf; adjustment external fixator left tibia under fluoro; retention suture/wound vac placement left lateral calf wound   FASCIOTOMY  01/06/2012   Procedure: FASCIOTOMY;  Surgeon: Meredith Pel, MD;  [facility]: [facility];  Service: Orthopedics;  Laterality: Left;  Four compartment fasciotomy   FIDUCIAL MARKER PLACEMENT  05/04/2020   Procedure: FIDUCIAL MARKER PLACEMENT;  Surgeon: Garner Nash, DO;  Location:  [facility];  Service: Pulmonary;;   HOT HEMOSTASIS N/A 06/22/2018   Procedure: HEMOSTASIS;  Surgeon: [id number] v, DO;  Location: [facility];  Service: Gastroenterology;  Laterality: N/A;  Epinepherine and clip placed   I & D EXTREMITY  01/12/2012   Procedure: IRRIGATION AND DEBRIDEMENT EXTREMITY;  Surgeon: Rozanna Box, MD;  Location: [place];  Service: Orthopedics;  Laterality: Left;  LAYERD CLOSURE LEFT LEG WOUND   I & D EXTREMITY Right 10/17/2015   Procedure: IRRIGATION AND DEBRIDEMENT EXTREMITY;  Surgeon: Leandrew Koyanagi, MD;  Location: [facility];  Service: Orthopedics;  Laterality: Right;   I & D EXTREMITY Right 10/20/2015   Procedure: REPEAT IRRIGATION AND DEBRIDEMENT EXTREMITY, right lower leg;  Surgeon: Mcarthur Rossetti, MD;  Location: [facility];  Service: Orthopedics;  Laterality: Right;   KNEE SURGERY     left knee and right  knee   LEFT HEART CATH AND CORONARY ANGIOGRAPHY N/A 06/27/2018   Procedure: LEFT HEART CATH AND CORONARY ANGIOGRAPHY;  Surgeon: [name], Lucas M, MD;  Location: [facility];  Service: Cardiovascular;  Laterality: N/A;   LEFT HEART CATH AND CORONARY ANGIOGRAPHY N/A 12/12/2019   Procedure: LEFT HEART CATH AND CORONARY ANGIOGRAPHY;  Surgeon: Wellington Hampshire, MD;  Location: [facility];  Service: Cardiovascular;  Laterality: N/A;   MASS EXCISION Left 09/22/2015   Procedure: EXCISION MASS;  Surgeon: Mcarthur Rossetti, MD;  Location: [facility];  Service: Orthopedics;  Laterality: Left;   ORIF TIBIA PLATEAU  02/07/2012   Procedure: OPEN REDUCTION INTERNAL FIXATION (ORIF) TIBIAL PLATEAU;  Surgeon: Rozanna Box, MD;  Location: [facility];  Service: Orthopedics;  Laterality: Left;  ORIF LEFT TIBIAL PLATEAU/ REMOVE EXTERNAL FIXATION LEFT LEG   ORIF TIBIA PLATEAU Right 09/22/2015   Procedure: REMOVAL OF EXTERNAL FIXATOR RIGHT LEG, OPEN REDUCTION INTERNAL FIXATION (ORIF) RIGHT TIBIAL PLATEAU, EXCISION SMALL MASS LEFT LEG;  Surgeon: Mcarthur Rossetti, MD;  Location: [place];  Service: Orthopedics;  Laterality: Right;   SKIN SPLIT GRAFT Right 10/23/2015   Procedure: RIGHT GASTROC FLAP SPLIT THICKNESS SKIN GRAFT FROM RIGHT THING TO RIGHT LEG;  Surgeon: Irene Limbo, MD;  Location: [place];  Service: Plastics;  Laterality: Right;   TEE WITHOUT CARDIOVERSION N/A 12/16/2019   Procedure: TRANSESOPHAGEAL ECHOCARDIOGRAM (TEE);  Surgeon: Lajuana Matte, MD;  Location: [place];  Service: Open Heart Surgery;  Laterality: N/A;   VIDEO BRONCHOSCOPY WITH ENDOBRONCHIAL NAVIGATION Right 05/04/2020   Procedure: VIDEO BRONCHOSCOPY WITH ENDOBRONCHIAL NAVIGATION;  Surgeon: Garner Nash, DO;  Location: [facility];  Service: Pulmonary;  Laterality: Right;   WRIST SURGERY     left     Home Medications:  Prior to Admission medications   Medication Sig Start Date End Date Taking? Authorizing Provider   albuterol (VENTOLIN HFA) 108 (90 Base) MCG/ACT inhaler INHALE 2 PUFFS BY MOUTH EVERY 4 HOURS FOR WHEEZE AND SHORTNESS OF BREATH Patient taking differently: Inhale 2 puffs into the lungs every 4 (four) hours as needed for wheezing or shortness of breath. 12/10/21  Yes Brimage, Vondra, DO  ANORO ELLIPTA 62.5-25 MCG/ACT AEPB INHALE 1 PUFF BY MOUTH EVERY DAY Patient taking differently: 1 puff daily. 11/04/21  Yes Brimage, Vondra, DO  diclofenac Sodium (VOLTAREN) 1 % GEL Apply 2 g topically 4 (four) times daily. Patient taking differently: Apply 2 g topically 4 (four) times daily as needed (pain). 12/28/20  Yes Brimage, Vondra, DO  hydrOXYzine (ATARAX) 10 MG tablet TAKE 1 TABLET(10 MG) BY MOUTH EVERY 8 HOURS AS  NEEDED FOR ANXIETY Patient taking differently: Take 10 mg by mouth every 8 (eight) hours as needed for anxiety. 11/04/21  Yes Brimage, Vondra, DO  ibuprofen (ADVIL) 200 MG tablet Take 200 mg by mouth every 6 (six) hours as needed for mild pain or headache.   Yes [provider]  ipratropium-albuterol (DUONEB) 0.5-2.5 (3) MG/3ML SOLN Take 3 mLs by nebulization every 6 (six) hours as needed. Patient taking differently: Take 3 mLs by nebulization every 6 (six) hours as needed (for wheezing and shortness of breath). 09/10/20  Yes Ghimire, Henreitta Leber, MD  metoprolol tartrate (LOPRESSOR) 25 MG tablet TAKE 1 TABLET(25 MG) BY MOUTH TWICE DAILY Patient taking differently: Take 25 mg by mouth 2 (two) times daily. 08/24/21  Yes Brimage, Vondra, DO  nitroGLYCERIN (NITROSTAT) 0.4 MG SL tablet Place 1 tablet (0.4 mg total) under the tongue every 5 (five) minutes as needed for chest pain. 12/10/21  Yes Brimage, Vondra, DO  OXYGEN Inhale 3 L into the lungs See admin instructions. Continuous at night, as needed during the day   Yes [provider]  oxymetazoline (AFRIN) 0.05 % nasal spray Place 1 spray into both nostrils 2 (two) times daily as needed for congestion (nose bleeds).   Yes [provider]  rosuvastatin (CRESTOR) 40 MG tablet Take 40 mg by mouth daily. 11/02/21  Yes [provider]  loratadine (CLARITIN) 10 MG tablet Take 1 tablet (10 mg total) by mouth daily. 03/04/11 05/23/18  Mariana Arn, MD    Inpatient Medications: Scheduled Meds:  folic acid  1 mg Oral Daily   ipratropium-albuterol  3 mL Nebulization Q6H   multivitamin with minerals  1 tablet Oral Daily   [START ON 01/04/2022] predniSONE  40 mg Oral Q breakfast   rosuvastatin  40 mg Oral Daily   thiamine  100 mg Oral Daily   Or   thiamine  100 mg Intravenous Daily   umeclidinium-vilanterol  1 puff Inhalation Daily   Continuous Infusions:  heparin 1,000 Units/hr (01/03/22 0609)   PRN Meds: acetaminophen **OR** acetaminophen, hydrOXYzine  Allergies:    Allergies  Allergen Reactions   Ace Inhibitors Swelling   Ampicillin Nausea And Vomiting   Penicillins Nausea And Vomiting    Did it involve swelling of the face/tongue/throat, SOB, or low BP? N Did it involve sudden or severe rash/hives, skin peeling, or any reaction on the inside of your mouth or nose? N Did you need to seek medical attention at a hospital or doctor's office? N When did it last happen?   Teenager    If all above answers are NO, may proceed with cephalosporin use.   Codeine Nausea And Vomiting   Lisinopril Swelling    REACTION: angioedema   Aspirin     Nose bleeds    Social History:   Social History   Socioeconomic History   Marital status: Married    Spouse name: Not on file   Number of children: 2   Years of education: Not on file   Highest education level: Not on file  Occupational History   Not on file  Tobacco Use   Smoking status: Every Day    Packs/day: 0.50    Years: 51.00    Pack years: 25.50    Types: Cigarettes   Smokeless tobacco: Former    Types: Snuff, Chew    Quit date: 01/05/1965   Tobacco comments:    currently half a pack a day-05/26/2020  Vaping Use   Vaping Use: Never  used   Substance and Sexual Activity   Alcohol use: Yes    Alcohol/week: 6.0 standard drinks    Types: 6 Cans of beer per week    Comment: drinks about a 6-pack/day   Drug use: No   Sexual activity: Yes    Partners: Female  Other Topics Concern   Not on file  Social History Narrative   Not on file   Social Determinants of Health   Financial Resource Strain: Not on file  Food Insecurity: No Food Insecurity   Worried About Running Out of Food in the Last Year: Never true   Ran Out of Food in the Last Year: Never true  Transportation Needs: No Transportation Needs   Lack of Transportation (Medical): No   Lack of Transportation (Non-Medical): No  Physical Activity: Not on file  Stress: Not on file  Social Connections: Not on file  Intimate Partner Violence: Not on file    Family History:    Family History  Problem Relation Age of Onset   Emphysema Father      ROS:  Please see the history of present illness.  General:no colds or fevers, no weight changes Skin:no rashes or ulcers HEENT:no blurred vision, no congestion CV:see HPI PUL:see HPI GI:no diarrhea constipation or melena, no indigestion GU:no hematuria, no dysuria MS:no joint pain, no claudication Neuro:no syncope, no lightheadedness, increasing weakness Endo:no diabetes, no thyroid disease  All other ROS reviewed and negative.     Physical Exam/Data:   Vitals:   01/03/22 1000 01/03/22 1030 01/03/22 1045 01/03/22 1115  BP: 128/70 119/89 115/82 134/77  Pulse: 96 (!) 107 87 87  Resp: 20 (!) 23 14 17   Temp:      TempSrc:      SpO2: 97% 98% 99% 98%  Weight:      Height:        Intake/Output Summary (Last 24 hours) at 01/03/2022 1124 Last data filed at 01/03/2022 0711 Gross per 24 hour  Intake 50 ml  Output --  Net 50 ml   Last 3 Weights 01/03/2022 11/26/2021 12/28/2020  Weight (lbs) 145 lb 134 lb 7.7 oz 133 lb 6 oz  Weight (kg) 65.772 kg 61 kg 60.499 kg     Body mass index is 20.22 kg/m.  General:  frail  male, in no acute distress HEENT: + nystagmus present since birth  Neck: mild JVD Vascular: No carotid bruits; Distal pulses 2+ bilaterally Cardiac:  normal S1, S2; RRR; no murmur gallup rub or click Lungs:  diminished to auscultation bilaterally, no wheezing, rhonchi or rales  Abd: soft, nontender, no hepatomegaly  Ext: no edema Musculoskeletal:  No deformities, BUE and BLE strength normal and equal Skin: warm and dry  Neuro:  alert and oriented X 3 MAE follows commands, no focal abnormalities noted Psych:  Normal affect   Relevant CV Studies: 12/16/19  intraoperative TEE  POST-OP IMPRESSIONS  - Left Ventricle: The left ventricle is unchanged from pre-bypass.  - Right Ventricle: The right ventricle appears unchanged from pre-bypass.  - Aorta: The aorta appears unchanged from pre-bypass.  - Left Atrium: The left atrium appears unchanged from pre-bypass.  - Left Atrial Appendage: The left atrial appendage appears unchanged from  pre-bypass.  - Aortic Valve: The aortic valve appears unchanged from pre-bypass.  - Mitral Valve: The mitral valve appears unchanged from pre-bypass.  - Tricuspid Valve: The tricuspid valve appears unchanged from pre-bypass.  - Interventricular Septum: The interventricular septum appears unchanged  from  pre-bypass.  -  Pericardium: The pericardium appears unchanged from pre-bypass.  - Comments: - LVEF preserved. CO > 5 L/min  - No significant RWMA.  - No valvular changes.  - No aortic dissection noted after cannula removal.   PRE-OP FINDINGS   Left Ventricle: The left ventricle has normal systolic function, with an  ejection fraction of 55-60%. The cavity size was normal. There is no  increase in left ventricular wall thickness. No evidence of left  ventricular regional wall motion  abnormalities.   Right Ventricle: The right ventricle has mildly reduced systolic function.  The cavity was normal. There is no increase in right ventricular wall   thickness. Right ventricular systolic pressure is normal.   Left Atrium: Left atrial size was normal in size. The left atrial  appendage is well visualized and there is no evidence of thrombus present.  Left atrial appendage velocity is normal at greater than 40 cm/s.   Right Atrium: Right atrial size was normal in size.  Laboratory Data:  High Sensitivity Troponin:   Recent Labs  Lab 01/03/22 0530 01/03/22 0739 01/03/22 1026  TROPONINIHS 23* 71* 77*     Chemistry Recent Labs  Lab 01/03/22 0530 01/03/22 0810  NA 139 135  K 4.6 4.2  CL 96*  --   CO2 29  --   GLUCOSE 132*  --   BUN 8  --   CREATININE 1.03  --   CALCIUM 9.6  --   MG 2.3  --   GFRNONAA >60  --   ANIONGAP 14  --     No results for input(s): PROT, ALBUMIN, AST, ALT, ALKPHOS, BILITOT in the last 168 hours. Lipids No results for input(s): CHOL, TRIG, HDL, LABVLDL, LDLCALC, CHOLHDL in the last 168 hours.  Hematology Recent Labs  Lab 01/03/22 0530 01/03/22 0810  WBC 7.0  --   RBC 4.36  --   HGB 13.9 13.9  HCT 42.7 41.0  MCV 97.9  --   MCH 31.9  --   MCHC 32.6  --   RDW 14.0  --   PLT 174  --    Thyroid No results for input(s): TSH, FREET4 in the last 168 hours.  BNPNo results for input(s): BNP, PROBNP in the last 168 hours.  DDimer No results for input(s): DDIMER in the last 168 hours.   Radiology/Studies:  DG Chest Port 1 View  Result Date: 01/03/2022 CLINICAL DATA:  Shortness of breath. EXAM: PORTABLE CHEST 1 VIEW COMPARISON:  11/26/2021 FINDINGS: Heart size and mediastinal contours are stable. Previous median sternotomy and CABG procedure. Post treatment changes within the right upper lung are identified in the region of fiducial markers. Chronic blunting of the right costophrenic angle is identified likely reflecting pleuroparenchymal scarring. No superimposed interstitial or airspace opacities. IMPRESSION: 1. No acute cardiopulmonary abnormalities. 2. Stable post treatment changes within the  right upper lung. Electronically Signed   By: Kerby Moors M.D.   On: 01/03/2022 06:00     Assessment and Plan:   COPD exacerbation/hypoxia/SOB - was on BiPAP and IV solumedrol given. Duonebs  - r/o PE with CTA of chest. IM following  continues to smoke we discussed importance of stopping he is aware.  Atrial fib borderline of RVR/increased troponins may be due to a fib and has rec'd IV 2.5 mg lopressor.  Echo pending- review of records reveals a fib with acute COPD exacerbation in 2021 but no mention in note. CHA2DS2VASc of 3.  IV heparin going (concern with bleeding with chronic  ETOH abuse and hx of tear HAS-BLED risk is high risk of 3)  pt tells me even ASA causes nose bleeds. He does not take  back in SR now.  Would continue lopressor BID for HTN and HR.  CAD with hx NSTEMI and CABG  X 4 2021 has been stable since.  Monitor troponins no angina though some chest tightness- could be related to COPD troponins flat at this time.  Echo pending. Hx of squamous cell carcinoma, RUL s/p SBRT needs follow up per IM Alcohol abuse/liver cirrhosis/hx Mallory -Weiss tear continues with ETOH 6 pk of beer per day, discussed decreasing to half that. per IM HTN BP 128/70 to 153/77  on metoprolol as outpt but missed 2 days.   HLD on crestor  lipids pending LDL Goal < 70    Risk Assessment/Risk Scores:          CHA2DS2-VASc Score = 3   This indicates a 3.2% annual risk of stroke. The patient's score is based upon: CHF History: 0 HTN History: 1 Diabetes History: 0 Stroke History: 0 Vascular Disease History: 1 Age Score: 1 Gender Score: 0         For questions or updates, please contact [facility] Please consult [email or web address] for contact info under    Signed, Cecilie Kicks, NP  01/03/2022 11:24 AM

## 2022-01-03 NOTE — ED Provider Notes (Signed)
[facility] EMERGENCY DEPARTMENT Provider Note   CSN: [id number] Arrival date & time: 01/03/22  2595     History  Chief Complaint  Patient presents with   Shortness of Breath    Lucas [name] is a 71 y.o. male.  The history is provided by the patient and the EMS personnel. The history is limited by the condition of the patient (Severe respiratory distress).  Shortness of Breath He has history of hypertension, hyperlipidemia, COPD, coronary artery disease, lung cancer and comes in because of difficulty breathing tonight.  Apparently, he also had a milder episode of difficulty breathing earlier today.  There has been a cough which is nonproductive.  He denies fever, chills, sweats.  He denies any chest pain, heaviness, tightness, pressure.  There has been no nausea or vomiting.  He does have a home nebulizer, but states he did not use it tonight.  EMS noted patient severely hypoxic on arrival with oxygen saturation 60% on 2 L nasal cannula, which is the patient's routine home oxygen.  He was placed on nonrebreather and then on CPAP with improvement in oxygen saturation.  Of note, patient is still smoking 1 pack of cigarettes a day.  He is status post radiation treatment of his lung cancer.  On arrival in the ED, he is switched over to BiPAP.   Home Medications Prior to Admission medications   Medication Sig Start Date End Date Taking? Authorizing Provider  albuterol (VENTOLIN HFA) 108 (90 Base) MCG/ACT inhaler INHALE 2 PUFFS BY MOUTH EVERY 4 HOURS FOR WHEEZE AND SHORTNESS OF BREATH 12/10/21   Brimage, Vondra, DO  ANORO ELLIPTA 62.5-25 MCG/ACT AEPB INHALE 1 PUFF BY MOUTH EVERY DAY Patient taking differently: 1 puff daily. 11/04/21   [name], DO  diclofenac Sodium (VOLTAREN) 1 % GEL Apply 2 g topically 4 (four) times daily. Patient taking differently: Apply 2 g topically 4 (four) times daily as needed (pain). 12/28/20   Brimage, Ronnette Juniper, DO  hydrOXYzine (ATARAX) 10 MG  tablet TAKE 1 TABLET(10 MG) BY MOUTH EVERY 8 HOURS AS NEEDED FOR ANXIETY Patient taking differently: Take 10 mg by mouth every 8 (eight) hours as needed for anxiety. 11/04/21   Brimage, Ronnette Juniper, DO  ipratropium-albuterol (DUONEB) 0.5-2.5 (3) MG/3ML SOLN Take 3 mLs by nebulization every 6 (six) hours as needed. Patient taking differently: Take 3 mLs by nebulization every 6 (six) hours as needed (for wheezing and shortness of breath). 09/10/20   Ghimire, Henreitta Leber, MD  metoprolol tartrate (LOPRESSOR) 25 MG tablet TAKE 1 TABLET(25 MG) BY MOUTH TWICE DAILY Patient taking differently: Take 25 mg by mouth 2 (two) times daily. 08/24/21   Brimage, Ronnette Juniper, DO  nitroGLYCERIN (NITROSTAT) 0.4 MG SL tablet Place 1 tablet (0.4 mg total) under the tongue every 5 (five) minutes as needed for chest pain. 12/10/21   Brimage, Ronnette Juniper, DO  OXYGEN Inhale 3 L into the lungs See admin instructions. Continuous at night, as needed during the day    [provider]  oxymetazoline (AFRIN) 0.05 % nasal spray Place 1 spray into both nostrils 2 (two) times daily as needed for congestion (nose bleeds).    [provider]  predniSONE (DELTASONE) 20 MG tablet 2 tabs po daily x 4 days 11/26/21   Deno Etienne, DO  rosuvastatin (CRESTOR) 40 MG tablet Take 40 mg by mouth daily. 11/02/21   [provider]  loratadine (CLARITIN) 10 MG tablet Take 1 tablet (10 mg total) by mouth daily. 03/04/11 05/23/18  Sherilyn Cooter,  Hosie Poisson, MD      Allergies    Ace inhibitors, Ampicillin, Penicillins, Codeine, Lisinopril, and Aspirin    Review of Systems   Review of Systems  Unable to perform ROS: Severe respiratory distress  Respiratory:  Positive for shortness of breath.    Physical Exam Updated Vital Signs BP (!) 151/81 (BP Location: Left Arm)    Pulse 92    Temp (!) 96.2 F (35.7 C) (Axillary)    Resp 20    Ht 5\' 11"  (1.803 m)    Wt 65.8 kg    SpO2 100%    BMI 20.22 kg/m  Physical Exam Vitals and nursing note reviewed.  71 year  old male on BiPAP. Vital signs are significant for elevated blood pressure. Oxygen saturation is 100%, which is normal, but only obtained while on BiPAP and supplemental oxygen. Head is normocephalic and atraumatic. PERRLA, EOMI. Oropharynx is clear. Neck is nontender and supple without adenopathy or JVD. Back is nontender and there is no CVA tenderness. Lungs have markedly diminished breath sounds diffusely with a prolonged exhalation phase but no overt rales or wheezes or rhonchi. Chest is nontender. Heart has an irregular rhythm without murmur. Abdomen is soft, flat, nontender. Extremities have no cyanosis or edema, full range of motion is present. Skin is warm and dry without rash. Neurologic: Awake and alert, cranial nerves are intact, moves all extremities equally.  ED Results / Procedures / Treatments   Labs (all labs ordered are listed, but only abnormal results are displayed) Labs Reviewed  RESP PANEL BY RT-PCR (FLU A&B, COVID) ARPGX2  CBC WITH DIFFERENTIAL/PLATELET  MAGNESIUM  BASIC METABOLIC PANEL  I-STAT ARTERIAL BLOOD GAS, ED  TROPONIN I (HIGH SENSITIVITY)    EKG EKG Interpretation  Date/Time:  Monday January 03 2022 05:27:43 EDT Ventricular Rate:  98 PR Interval:    QRS Duration: 88 QT Interval:  355 QTC Calculation: 454 R Axis:   82 Text Interpretation: Atrial fibrillation Borderline right axis deviation LVH with secondary repolarization abnormality ST depression, consider ischemia, diffuse lds When compared with ECG of 11/26/2021, Atrial fibrillation has replaced Sinus rhythm REPOLARIZATION ABNORMALITY is now present Confirmed by Delora Fuel ([id number]) on 01/03/2022 5:35:47 AM  Radiology No results found.  Procedures Procedures  Cardiac monitor, per my interpretation, shows atrial fibrillation with controlled ventricular response.  Medications Ordered in ED Medications  ipratropium-albuterol (DUONEB) 0.5-2.5 (3) MG/3ML nebulizer solution 3 mL (has no  administration in time range)  methylPREDNISolone sodium succinate (SOLU-MEDROL) 125 mg/2 mL injection 125 mg (has no administration in time range)  magnesium sulfate IVPB 2 g 50 mL (has no administration in time range)    ED Course/ Medical Decision Making/ A&P                           Medical Decision Making Amount and/or Complexity of Data Reviewed Labs: ordered. Radiology: ordered.  Risk Prescription drug management.   COPD exacerbation.  No peripheral edema or neck vein distention to suggest heart failure.  No fever and only minimal cough, pneumonia felt to be unlikely but will need to check chest x-ray.  Atrial fibrillation which appears to be new onset.  Old records are reviewed, and he does have prior hospitalizations and ED visits for COPD exacerbation and has been intubated.  He has had CO2 retention in the past.  Oxygen level will need to be titrated to maintain oxygen saturations of 90-94%.  ECG shows atrial fibrillation, left ventricular  hypertrophy with secondary repolarization changes.  I did find 1 prior ECG which showed atrial fibrillation, but no indication that the diagnosis was pursued.  He does not need any rate control medication, but will need to be started on heparin.  He is maintained on BiPAP, will check ABG to make sure that he is not having CO2 retention.  In the past, he has been a full code.  I have discussed CODE STATUS with the patient, and he states that he still wishes to be intubated if necessary.  Chest x-ray shows no evidence of pneumonia.  Arterial blood gas shows PCO2 of 46 with normal pH.  In the setting of acute respiratory distress, these values are concerning for possible impending hypercarbic respiratory failure.  Other labs are unremarkable.  Patient is reevaluated and is resting comfortably on BiPAP, but still feels that he needs the assistance of BiPAP to breathe.  He is not felt to be safe for discharge.  Family practice service has been consulted,  callback pending.  Case is signed out to Dr. Doren Custard to discuss admission with family practice resident.  CRITICAL CARE Performed by: Delora Fuel Total critical care time: 115 minutes Critical care time was exclusive of separately billable procedures and treating other patients. Critical care was necessary to treat or prevent imminent or life-threatening deterioration. Critical care was time spent personally by me on the following activities: development of treatment plan with patient and/or surrogate as well as nursing, discussions with consultants, evaluation of patient's response to treatment, examination of patient, obtaining history from patient or surrogate, ordering and performing treatments and interventions, ordering and review of laboratory studies, ordering and review of radiographic studies, pulse oximetry and re-evaluation of patient's condition.  CHA2DS2/VAS Stroke Risk Points       >= 2 Points: High Risk  1 - 1.99 Points: Medium Risk  0 Points: Low Risk    Last Change: N/A      This score determines the patient's risk of having a stroke if the  patient has atrial fibrillation.     The patient's score is 3 with one-point given for age, one-point given for history of hypertension, 1 point for history of coronary artery disease.  This puts him at stroke risk of 3.2 %/year and mandates anticoagulation in the absence of contraindications.          Final Clinical Impression(s) / ED Diagnoses Final diagnoses:  COPD exacerbation ([place])  Acute and chronic respiratory failure with hypoxia ([facility])  Atrial fibrillation with controlled ventricular response [facility])    Rx / DC Orders ED Discharge Orders          Ordered    Amb referral to AFIB Clinic        01/03/22 5859              Delora Fuel, MD 29[birth year] [phone]

## 2022-01-03 NOTE — Progress Notes (Cosign Needed)
RT transported pt to CT scan with RN. Pt tolerated transport well. No complications noted.   ?

## 2022-01-04 ENCOUNTER — Other Ambulatory Visit (HOSPITAL_COMMUNITY): Payer: Self-pay

## 2022-01-04 DIAGNOSIS — Z7951 Long term (current) use of inhaled steroids: Secondary | ICD-10-CM | POA: Diagnosis not present

## 2022-01-04 DIAGNOSIS — J9621 Acute and chronic respiratory failure with hypoxia: Secondary | ICD-10-CM | POA: Diagnosis present

## 2022-01-04 DIAGNOSIS — K7460 Unspecified cirrhosis of liver: Secondary | ICD-10-CM | POA: Diagnosis present

## 2022-01-04 DIAGNOSIS — Z85118 Personal history of other malignant neoplasm of bronchus and lung: Secondary | ICD-10-CM | POA: Diagnosis not present

## 2022-01-04 DIAGNOSIS — I2510 Atherosclerotic heart disease of native coronary artery without angina pectoris: Secondary | ICD-10-CM | POA: Diagnosis present

## 2022-01-04 DIAGNOSIS — Z9981 Dependence on supplemental oxygen: Secondary | ICD-10-CM | POA: Diagnosis not present

## 2022-01-04 DIAGNOSIS — E785 Hyperlipidemia, unspecified: Secondary | ICD-10-CM | POA: Diagnosis present

## 2022-01-04 DIAGNOSIS — Z20822 Contact with and (suspected) exposure to covid-19: Secondary | ICD-10-CM | POA: Diagnosis present

## 2022-01-04 DIAGNOSIS — F1010 Alcohol abuse, uncomplicated: Secondary | ICD-10-CM | POA: Diagnosis present

## 2022-01-04 DIAGNOSIS — I248 Other forms of acute ischemic heart disease: Secondary | ICD-10-CM | POA: Diagnosis present

## 2022-01-04 DIAGNOSIS — R64 Cachexia: Secondary | ICD-10-CM | POA: Diagnosis present

## 2022-01-04 DIAGNOSIS — J449 Chronic obstructive pulmonary disease, unspecified: Secondary | ICD-10-CM | POA: Diagnosis present

## 2022-01-04 DIAGNOSIS — E440 Moderate protein-calorie malnutrition: Secondary | ICD-10-CM | POA: Diagnosis present

## 2022-01-04 DIAGNOSIS — I4891 Unspecified atrial fibrillation: Secondary | ICD-10-CM | POA: Diagnosis not present

## 2022-01-04 DIAGNOSIS — Z79899 Other long term (current) drug therapy: Secondary | ICD-10-CM | POA: Diagnosis not present

## 2022-01-04 DIAGNOSIS — I252 Old myocardial infarction: Secondary | ICD-10-CM | POA: Diagnosis not present

## 2022-01-04 DIAGNOSIS — Z6820 Body mass index (BMI) 20.0-20.9, adult: Secondary | ICD-10-CM | POA: Diagnosis not present

## 2022-01-04 DIAGNOSIS — F419 Anxiety disorder, unspecified: Secondary | ICD-10-CM | POA: Diagnosis present

## 2022-01-04 DIAGNOSIS — F17210 Nicotine dependence, cigarettes, uncomplicated: Secondary | ICD-10-CM | POA: Diagnosis present

## 2022-01-04 DIAGNOSIS — I480 Paroxysmal atrial fibrillation: Secondary | ICD-10-CM | POA: Diagnosis present

## 2022-01-04 DIAGNOSIS — I10 Essential (primary) hypertension: Secondary | ICD-10-CM | POA: Diagnosis present

## 2022-01-04 DIAGNOSIS — Z9114 Patient's other noncompliance with medication regimen: Secondary | ICD-10-CM | POA: Diagnosis not present

## 2022-01-04 DIAGNOSIS — J441 Chronic obstructive pulmonary disease with (acute) exacerbation: Secondary | ICD-10-CM | POA: Diagnosis present

## 2022-01-04 DIAGNOSIS — R54 Age-related physical debility: Secondary | ICD-10-CM | POA: Diagnosis present

## 2022-01-04 DIAGNOSIS — J42 Unspecified chronic bronchitis: Secondary | ICD-10-CM | POA: Diagnosis not present

## 2022-01-04 DIAGNOSIS — Z7952 Long term (current) use of systemic steroids: Secondary | ICD-10-CM | POA: Diagnosis not present

## 2022-01-04 DIAGNOSIS — G629 Polyneuropathy, unspecified: Secondary | ICD-10-CM | POA: Diagnosis present

## 2022-01-04 LAB — CBC
HCT: 37 % — ABNORMAL LOW (ref 39.0–52.0)
Hemoglobin: 12.1 g/dL — ABNORMAL LOW (ref 13.0–17.0)
MCH: 30.9 pg (ref 26.0–34.0)
MCHC: 32.7 g/dL (ref 30.0–36.0)
MCV: 94.6 fL (ref 80.0–100.0)
Platelets: 156 10*3/uL (ref 150–400)
RBC: 3.91 MIL/uL — ABNORMAL LOW (ref 4.22–5.81)
RDW: 14 % (ref 11.5–15.5)
WBC: 2.9 10*3/uL — ABNORMAL LOW (ref 4.0–10.5)
nRBC: 0 % (ref 0.0–0.2)

## 2022-01-04 LAB — TROPONIN I (HIGH SENSITIVITY): Troponin I (High Sensitivity): 48 ng/L — ABNORMAL HIGH (ref <18)

## 2022-01-04 LAB — COMPREHENSIVE METABOLIC PANEL
ALT: 45 U/L — ABNORMAL HIGH (ref 0–44)
AST: 71 U/L — ABNORMAL HIGH (ref 15–41)
Albumin: 3.8 g/dL (ref 3.5–5.0)
Alkaline Phosphatase: 89 U/L (ref 38–126)
Anion gap: 10 (ref 5–15)
BUN: 11 mg/dL (ref 8–23)
CO2: 29 mmol/L (ref 22–32)
Calcium: 9.7 mg/dL (ref 8.9–10.3)
Chloride: 94 mmol/L — ABNORMAL LOW (ref 98–111)
Creatinine, Ser: 0.87 mg/dL (ref 0.61–1.24)
GFR, Estimated: >60 mL/min (ref >60)
Glucose, Bld: 131 mg/dL — ABNORMAL HIGH (ref 70–99)
Potassium: 3.9 mmol/L (ref 3.5–5.1)
Sodium: 133 mmol/L — ABNORMAL LOW (ref 135–145)
Total Bilirubin: 0.6 mg/dL (ref 0.3–1.2)
Total Protein: 7.3 g/dL (ref 6.5–8.1)

## 2022-01-04 LAB — LIPID PANEL
Cholesterol: 150 mg/dL (ref 0–200)
Cholesterol: 152 mg/dL (ref 0–200)
HDL: 101 mg/dL (ref >40)
HDL: 102 mg/dL (ref >40)
LDL Cholesterol: 41 mg/dL (ref 0–99)
LDL Cholesterol: 42 mg/dL (ref 0–99)
Total CHOL/HDL Ratio: 1.5 RATIO
Total CHOL/HDL Ratio: 1.5 RATIO
Triglycerides: 38 mg/dL (ref <150)
Triglycerides: 41 mg/dL (ref <150)
VLDL: 8 mg/dL (ref 0–40)
VLDL: 8 mg/dL (ref 0–40)

## 2022-01-04 LAB — HEPARIN LEVEL (UNFRACTIONATED): Heparin Unfractionated: 0.32 IU/mL (ref 0.30–0.70)

## 2022-01-04 MED ORDER — ENSURE ENLIVE PO LIQD
237.0000 mL | Freq: Three times a day (TID) | ORAL | Status: DC
  Administered 2022-01-04 – 2022-01-05 (×3): 237 mL via ORAL

## 2022-01-04 MED ORDER — IPRATROPIUM-ALBUTEROL 0.5-2.5 (3) MG/3ML IN SOLN
3.0000 mL | Freq: Three times a day (TID) | RESPIRATORY_TRACT | Status: DC
  Administered 2022-01-04 – 2022-01-05 (×4): 3 mL via RESPIRATORY_TRACT
  Filled 2022-01-04 (×4): qty 3

## 2022-01-04 MED ORDER — ORAL CARE MOUTH RINSE
15.0000 mL | Freq: Two times a day (BID) | OROMUCOSAL | Status: DC
  Administered 2022-01-04 – 2022-01-05 (×3): 15 mL via OROMUCOSAL

## 2022-01-04 MED ORDER — BUDESONIDE 0.5 MG/2ML IN SUSP
0.5000 mg | Freq: Two times a day (BID) | RESPIRATORY_TRACT | Status: AC
  Administered 2022-01-05: 0.5 mg via RESPIRATORY_TRACT
  Filled 2022-01-04: qty 2

## 2022-01-04 MED ORDER — FLUTICASONE-UMECLIDIN-VILANT 100-62.5-25 MCG/ACT IN AEPB
1.0000 | INHALATION_SPRAY | Freq: Every day | RESPIRATORY_TRACT | Status: DC
Start: 2022-01-04 — End: 2022-01-11

## 2022-01-04 NOTE — Progress Notes (Signed)
FPTS Interim Night Progress Note ? ?S:Patient sleeping comfortably.  Rounded with primary night RN.  No concerns voiced.  No orders required.   ? ?O: ?Today's Vitals  ? 01/04/22 1700 01/04/22 1938 01/04/22 2031 01/04/22 2125  ?BP:  125/82  (!) 143/91  ?Pulse:  91  98  ?Resp: 20 18    ?Temp:  98.2 ?F (36.8 ?C)    ?TempSrc:  Oral    ?SpO2:  100% 98%   ?Weight:      ?Height:      ?PainSc:  0-No pain    ? ? ? ? ?A/P: ?Continue current management ? ?Carollee Leitz MD ?PGY-3, [place] ?Service pager [phone]   ?

## 2022-01-04 NOTE — Care Management Obs Status (Signed)
MEDICARE OBSERVATION STATUS NOTIFICATION ? ? ?Patient Details  ?Name: Lucas [name] ?MRN: [id number] ?Date of Birth: May 28, 1951 ? ? ?Medicare Observation Status Notification Given:  Yes ? ? ? ?Angelita Ingles, RN ?01/04/2022, 9:59 AM ?

## 2022-01-04 NOTE — Progress Notes (Addendum)
Occupational Therapy Evaluation ?Patient Details ?Name: Lucas [name] ?MRN: [id number] ?DOB: Jul 31, 1951 ?Today's Date: 01/04/2022 ? ? ?History of Present Illness 71 y.o. male presenting with shortness of breath. PMH is significant for COPD, HLD, HTN, alcohol abuse, tobacco use.  ? ?Clinical Impression ?  ?PTA, pt was living with his wife and was independent with ADLs and light IADLs; using 3L home O2. Pt currently performing ADLs and functional mobility with Supervision level and Rw. Pt presenting with decreased activity tolerance as seen by fatigue and SOB with minimal activity. SpO2 100-98% on 3L and HR 80-90s. Pt would benefit from further acute OT to facilitate safe dc. Recommend dc to home once medically stable per physician.  ?   ? ?Recommendations for follow up therapy are one component of a multi-disciplinary discharge planning process, led by the attending physician.  Recommendations may be updated based on patient status, additional functional criteria and insurance authorization.  ? ?Follow Up Recommendations ? No OT follow up  ?  ?Assistance Recommended at Discharge Intermittent Supervision/Assistance  ?Patient can return home with the following A little help with walking and/or transfers;A little help with bathing/dressing/bathroom ? ?  ?Functional Status Assessment ? Patient has had a recent decline in their functional status and demonstrates the ability to make significant improvements in function in a reasonable and predictable amount of time.  ?Equipment Recommendations ? None recommended by OT  ?  ?Recommendations for Other Services PT consult ? ? ?  ?Precautions / Restrictions Precautions ?Precautions: Fall;Other (comment) ?Precaution Comments: watch SpO2 ?Restrictions ?Weight Bearing Restrictions: No  ? ?  ? ?Mobility Bed Mobility ?Overal bed mobility: Modified Independent ?  ?  ?  ?  ?  ?  ?General bed mobility comments: Increased time for bed mobility. ?  ? ?Transfers ?Overall transfer level: Needs  assistance ?Equipment used: None ?Transfers: Sit to/from Stand ?Sit to Stand: Supervision ?  ?  ?  ?  ?  ?General transfer comment: Supervision for safety ?  ? ?  ?Balance Overall balance assessment: Needs assistance ?Sitting-balance support: Bilateral upper extremity supported, Feet unsupported ?Sitting balance-Leahy Scale: Good ?  ?  ?Standing balance support: No upper extremity supported ?Standing balance-Leahy Scale: Fair ?  ?  ?  ?  ?  ?  ?  ?  ?  ?  ?  ?  ?   ? ?ADL either performed or assessed with clinical judgement  ? ?ADL Overall ADL's : Needs assistance/impaired ?  ?  ?  ?  ?  ?  ?  ?  ?  ?  ?  ?  ?  ?  ?  ?  ?  ?  ?  ?General ADL Comments: Pt performing ADLs and functional mobility at Supervision level with RW.  ? ? ? ?Vision   ?Additional Comments: Noting pulsating nystamus; dependent on gaze - gaze R and then pulsating R  ?   ?Perception   ?  ?Praxis   ?  ? ?Pertinent Vitals/Pain Pain Assessment ?Pain Assessment: No/denies pain  ? ? ? ?Hand Dominance Right ?  ?Extremity/Trunk Assessment Upper Extremity Assessment ?Upper Extremity Assessment: Overall WFL for tasks assessed ?  ?Lower Extremity Assessment ?Lower Extremity Assessment: Defer to PT evaluation ?  ?Cervical / Trunk Assessment ?Cervical / Trunk Assessment: Kyphotic ?  ?Communication Communication ?Communication: No difficulties ?  ?Cognition Arousal/Alertness: Awake/alert ?Behavior During Therapy: [facility] for tasks assessed/performed ?Overall Cognitive Status: Within Functional Limits for tasks assessed ?  ?  ?  ?  ?  ?  ?  ?  ?  ?  ?  ?  ?  ?  ?  ?  ?  General Comments: Feel pt is at baselien cognition. Slightly impulsive with decreased awareness of safety ?  ?  ?General Comments  SpO2 100-98% on 3L. HR 80-90s. ? ?  ?Exercises   ?  ?Shoulder Instructions    ? ? ?Home Living Family/patient expects to be discharged to:: Private residence ?Living Arrangements: Spouse/significant other ?Available Help at Discharge: Family;Available 24 hours/day ?Type of  Home: Apartment ?Home Access: Stairs to enter ?Entrance Stairs-Number of Steps: 2 ?Entrance Stairs-Rails: Left ?Home Layout: One level ?  ?  ?Bathroom Shower/Tub: Tub/shower unit ?  ?Bathroom Toilet: Standard ?Bathroom Accessibility: Yes ?  ?Home Equipment: Conservation officer, nature (2 wheels);Cane - single point;Shower seat;Crutches ?  ?Additional Comments: Oxygen at home PRN ?  ? ?  ?Prior Functioning/Environment Prior Level of Function : Independent/Modified Independent ?  ?  ?  ?  ?  ?  ?Mobility Comments: Doesn to use DME ?ADLs Comments: Reports he performs light IADLs ?  ? ?  ?  ?OT Problem List: Decreased strength;Decreased range of motion;Decreased activity tolerance;Impaired balance (sitting and/or standing) ?  ?   ?OT Treatment/Interventions: Self-care/ADL training;Therapeutic exercise;Energy conservation;DME and/or AE instruction;Therapeutic activities;Patient/family education  ?  ?OT Goals(Current goals can be found in the care plan section) Acute Rehab OT Goals ?Patient Stated Goal: Go home ?OT Goal Formulation: With patient ?Time For Goal Achievement: 01/18/22 ?Potential to Achieve Goals: Good  ?OT Frequency: Min 2X/week ?  ? ?Co-evaluation   ?  ?  ?  ?  ? ?  ?AM-PAC OT "6 Clicks" Daily Activity     ?Outcome Measure Help from another person eating meals?: None ?Help from another person taking care of personal grooming?: A Little ?Help from another person toileting, which includes using toliet, bedpan, or urinal?: A Little ?Help from another person bathing (including washing, rinsing, drying)?: A Little ?Help from another person to put on and taking off regular upper body clothing?: None ?Help from another person to put on and taking off regular lower body clothing?: A Little ?6 Click Score: 20 ?  ?End of Session Equipment Utilized During Treatment: Rolling walker (2 wheels);Oxygen ?Nurse Communication: Mobility status ? ?Activity Tolerance: Patient tolerated treatment well ?Patient left: in bed;with call  bell/phone within reach;with bed alarm set (Declined OOB to recliner) ? ?OT Visit Diagnosis: Unsteadiness on feet (R26.81);Other abnormalities of gait and mobility (R26.89);Muscle weakness (generalized) (M62.81)  ?              ?Time: 6789-3810 ?OT Time Calculation (min): 20 min ?Charges:  OT General Charges ?$OT Visit: 1 Visit ?OT Evaluation ?$OT Eval Low Complexity: 1 Low ? ?Travone Georg MSOT, OTR/L ?Acute Rehab ?Pager: [phone] ?Office: [phone] ? ?Kiarra Kidd M Rashana Andrew ?01/04/2022, 12:54 PM ?

## 2022-01-04 NOTE — Progress Notes (Addendum)
Family Medicine Teaching Service ?Daily Progress Note ?Intern Pager: [phone] ? ?Patient name: Lucas [name] Medical record number: [id number] ?Date of birth: 1951/06/30 Age: 71 y.o. Gender: male ? ?Primary Care Provider: [name], DO ?Consultants: Cardiology ?Code Status: Full ? ?Pt Overview and Major Events to Date:  ?01/03/2022 admitted ? ?Assessment and Plan: ?Alice Vitelli. Iodice is a 71 year old male presented with shortness of breath.  Past medical history significant for COPD, hyperlipidemia, hypertension, alcohol abuse, tobacco use ?  ?Shortness of breath ?COPD ?Patient on 4 L oxygen nasal cannula.  Decreased oxygen to 3 L and patient did not appear to have worsening dyspnea. CTPE negative, Trops downtrended 77>48. Mg normal.  Afebrile.  At times tachypneic but saturation in the mid 90s. ?-Continuous pulse ox ?-Switch anoro ellipta to Incruse ellipta+Dulera (trelegy equivalent), plan for d/c tomorrow ?-DuoNebs every 6 hours as needed ?-Prednisone 40 mg daily for total 5 days (day 2/5) ?-PT recommends home health PT ?-OT eval and treat ? ?Paroxysmal atrial fibrillation with RVR ?Rising troponins, resolved ?CAD  Hx of NSTEMI  s/p CABG x4 (12/16/2019) ?Home meds: Aspirin 81, nitro as needed, metoprolol 25 mg twice daily, Crestor 40 mg daily.  Echo shows LVEF 60-65%, no wall motion abnormalities, grade 1 diastolic dysfunction.  Lipid panel normal. Now in NSR.  ?-Cardiology consulted, greatly appreciate recommendations ?-Consider switching to Eliquis, but patient reports easy bleed risk with even baby aspirin (epistaxis for multiple days). Cards recommends not starting AC given bleeding risk.  ?-IV heparin discontinued. Now on SCDs for DVT prophylaxis ? ?History of squamous cell carcinoma, RUL, s/p SBRT ?CT PE negative for PE ?-Follow-up with radiology/oncology after discharge ? ?Alcohol abuse  liver cirrhosis  history of Mallory-Weiss tear ?Patient reports drinking 6 pack of beers daily.  In the past  hospitalization patient required Ativan for alcohol withdrawal symptoms. CIWA score 4,4,2, primarily tremors.  Patient has no recollection that he has experienced alcohol withdrawal in the past. ?-Continue CIWA monitoring ? ?HTN ?Home med: metoprolol 25 mg bid. BP well controlled at this time.  ? ?HLD ?Home medications include Crestor 40 mg daily.  Most recent lipid panel was done 12/15/2019.  Total cholesterol 168, LDL 87, triglycerides 28, HDL 75. ?- Continue home Crestor ?- Repeat lipid panel ordered for labs tomorrow ? ?Tobacco abuse ?1 PPD since the patient was approximately 71 years old. ?-Consider nicotine patch once patient is more stable ? ?FEN/GI: Heart Healthy  ?PPx: SCDs ?Dispo:Home with home health  in 2-3 days. Barriers include ongoing medical care.  ? ?Subjective:  ?Patient reports he is breathing better than yesterday.  However, patient does report easy fatigability, while being helped up to the bathroom.  Echo results showed normal heart function so explained that he may be due to COPD exacerbation.  Patient is on 5-day prednisone. ? ?Objective: ?Temp:  [98 ?F (36.7 ?C)-98.7 ?F (37.1 ?C)] 98 ?F (36.7 ?C) (03/14 0416) ?Pulse Rate:  [69-124] 95 (03/14 0416) ?Resp:  [13-26] 20 (03/14 0416) ?BP: (115-153)/(65-119) 144/81 (03/14 0416) ?SpO2:  [93 %-100 %] 94 % (03/14 0416) ?FiO2 (%):  [30 %] 30 % (03/13 1113) ?Physical Exam: ?General: NAD, alert ?Cardiovascular: RRR ?Respiratory: CTAB ?Abdomen: nontender, soft ?Extremities: able to move all extremities ? ?Laboratory: ?Recent Labs  ?Lab 01/03/22 ?0530 01/03/22 ?6712 01/04/22 ?4580  ?WBC 7.0  --  2.9*  ?HGB 13.9 13.9 12.1*  ?HCT 42.7 41.0 37.0*  ?PLT 174  --  156  ? ?Recent Labs  ?Lab 01/03/22 ?0530 01/03/22 ?9983 01/04/22 ?  0044  ?NA 139 135 133*  ?K 4.6 4.2 3.9  ?CL 96*  --  94*  ?CO2 29  --  29  ?BUN 8  --  11  ?CREATININE 1.03  --  0.87  ?CALCIUM 9.6  --  9.7  ?PROT  --   --  7.3  ?BILITOT  --   --  0.6  ?ALKPHOS  --   --  89  ?ALT  --   --  45*  ?AST   --   --  71*  ?GLUCOSE 132*  --  131*  ? ? ? ? ?Imaging/Diagnostic Tests: ?CT Angio Chest Pulmonary Embolism (PE) W or WO Contrast ? ?Result Date: 01/03/2022 ?CLINICAL DATA:  Respiratory distress. EXAM: CT ANGIOGRAPHY CHEST WITH CONTRAST TECHNIQUE: Multidetector CT imaging of the chest was performed using the standard protocol during bolus administration of intravenous contrast. Multiplanar CT image reconstructions and MIPs were obtained to evaluate the vascular anatomy. RADIATION DOSE REDUCTION: This exam was performed according to the departmental dose-optimization program which includes automated exposure control, adjustment of the mA and/or kV according to patient size and/or use of iterative reconstruction technique. CONTRAST:  168mL OMNIPAQUE IOHEXOL 350 MG/ML SOLN COMPARISON:  08/24/2021 FINDINGS: Cardiovascular: The heart size is normal. No substantial pericardial effusion. Coronary artery calcification is evident. Mild atherosclerotic calcification is noted in the wall of the thoracic aorta. There is no filling defect within the opacified pulmonary arteries to suggest the presence of an acute pulmonary embolus. Mediastinum/Nodes: No mediastinal lymphadenopathy. There is no hilar lymphadenopathy. The esophagus has normal imaging features. There is no axillary lymphadenopathy. Lungs/Pleura: Centrilobular and paraseptal emphysema evident. Spiculated irregular right upper lobe nodular opacity with associated fiducial markers measures 3.4 x 2.5 cm today compared to 3.5 x 2.8 cm previously (remeasured). No new suspicious pulmonary nodule or mass. No focal airspace consolidation. No pleural effusion. Upper Abdomen: The liver shows diffusely decreased attenuation suggesting fat deposition. Musculoskeletal: No worrisome lytic or sclerotic osseous abnormality. Posttraumatic deformity noted multiple right ribs. Review of the MIP images confirms the above findings. IMPRESSION: 1. No CT evidence for acute pulmonary  embolus. 2. Similar appearance of the spiculated right upper lobe nodular opacity with associated fiducial markers. Lesion appears somewhat more confluent on today's study which could represent evolution of post treatment change but close attention on follow-up recommended. 3. Hepatic steatosis. 4. Aortic Atherosclerosis (ICD10-I70.0) and Emphysema (ICD10-J43.9). Electronically Signed   By: Misty Stanley M.D.   On: 01/03/2022 11:24  ? ?ECHOCARDIOGRAM COMPLETE ? ?Result Date: 01/03/2022 ?   ECHOCARDIOGRAM REPORT   Patient Name:   Sebastien [place] Crane Date of Exam: 01/03/2022 Medical Rec #:  [id number]    Height:       71.0 in Accession #:    [id number]   Weight:       145.0 lb Date of Birth:  05-Jun-1951    BSA:          1.839 m? Patient Age:    19 years     BP:           134/77 mmHg Patient Gender: M            HR:           89 bpm. Exam Location:  Inpatient Procedure: 2D Echo, Cardiac Doppler and Color Doppler Indications:    A-fib, respiratory distress  History:        Patient has prior history of Echocardiogram examinations, most  recent 12/12/2019. Risk Factors:Dyslipidemia and Hypertension.  Sonographer:    Maudry Mayhew MHA, RDMS, RVT, RDCS Referring Phys: [id number] Godfrey Pick  Sonographer Comments: Patient sitting upright, on Bipap. IMPRESSIONS  1. Left ventricular ejection fraction, by estimation, is 60 to 65%. The left ventricle has normal function. The left ventricle has no regional wall motion abnormalities. Left ventricular diastolic parameters are consistent with Grade I diastolic dysfunction (impaired relaxation).  2. Right ventricular systolic function is normal. The right ventricular size is normal.  3. The mitral valve is normal in structure. No evidence of mitral valve regurgitation. No evidence of mitral stenosis.  4. The aortic valve was not well visualized. Aortic valve regurgitation is not visualized. No aortic stenosis is present. Aortic valve area, by VTI measures 2.65 cm?Marland Kitchen Aortic valve  mean gradient measures 3.0 mmHg. Aortic valve Vmax measures 1.19 m/s.  5. The inferior vena cava is normal in size with <50% respiratory variability, suggesting right atrial pressure of 8 mmHg. FINDINGS  Left Ventricle: [place] Comment

## 2022-01-04 NOTE — Progress Notes (Signed)
ANTICOAGULATION CONSULT NOTE ? ?Pharmacy Consult for Heparin ?Indication: atrial fibrillation ? ?Allergies  ?Allergen Reactions  ? Ace Inhibitors Swelling  ? Ampicillin Nausea And Vomiting  ? Penicillins Nausea And Vomiting  ?  Did it involve swelling of the face/tongue/throat, SOB, or low BP? N ?Did it involve sudden or severe rash/hives, skin peeling, or any reaction on the inside of your mouth or nose? N ?Did you need to seek medical attention at a hospital or doctor's office? N ?When did it last happen?   Teenager    ?If all above answers are ?NO?, may proceed with cephalosporin use.  ? Codeine Nausea And Vomiting  ? Lisinopril Swelling  ?  REACTION: angioedema  ? Aspirin   ?  Nose bleeds  ? ? ?Patient Measurements: ?Height: 5\' 11"  (180.3 cm) ?Weight: 65.8 kg (145 lb) ?IBW/kg (Calculated) : 75.3 ? ?Vital Signs: ?Temp: 98 ?F (36.7 ?C) (03/14 0416) ?Temp Source: Oral (03/14 0416) ?BP: 144/81 (03/14 0416) ?Pulse Rate: 95 (03/14 0416) ? ?Labs: ?Recent Labs  ?  01/03/22 ?0530 01/03/22 ?0739 01/03/22 ?3664 01/03/22 ?1026 01/03/22 ?1251 01/03/22 ?1402 01/04/22 ?4034  ?HGB 13.9  --  13.9  --   --   --  12.1*  ?HCT 42.7  --  41.0  --   --   --  37.0*  ?PLT 174  --   --   --   --   --  156  ?HEPARINUNFRC  --   --   --   --   --  0.28* 0.32  ?CREATININE 1.03  --   --   --   --   --  0.87  ?TROPONINIHS 23* 71*  --  77* 48*  --   --   ? ? ? ?Estimated Creatinine Clearance: 72.5 mL/min (by C-G formula based on SCr of 0.87 mg/dL). ? ?Assessment: ?4 yom with hx PAF who presented to the ED with SOB and found to be in Afib with RVR. Pharmacy consulted to begin IV heparin. ? ?Heparin level is on low end of goal at 0.32 with heparin running at 1100 units/hour. No bleeding noted, Slight decrease in Hgb from 13.9>>12.1, PLT stable. No problems noted by RN. ? ?Patient expressed concern regarding using oral anticoagulation due to history of epistaxis with severe bleeding requiring hospital admission. After discussion with patient  regarding risk of stroke with atrial fibrillation and risk of bleeding with chronic anticoagulation, patient has elected not to proceed with chronic anticoagulation as an outpatient. Heparin infusion has been discontinued by FMTS.  ? ?Goal of Therapy:  ?Heparin level 0.3-0.7 units/ml ?Monitor platelets by anticoagulation protocol: Yes ?  ?Plan:  ?STOP heparin infusion  ?Monitor for s/sx of bleeding ? ?Adria Dill, PharmD ?PGY-1 Acute Care Resident  ?01/04/2022 6:05 AM  ? ? ? ? ?

## 2022-01-04 NOTE — TOC Initial Note (Addendum)
Transition of Care (TOC) - Initial/Assessment Note  ? ? ?Patient Details  ?Name: Lucas [name] ?MRN: [id number] ?Date of Birth: 11/25/1950 ? ?Transition of Care (TOC) CM/SW Contact:    ?Angelita Ingles, RN ?Phone [phone] ? ?01/04/2022, 11:52 AM ? ?Clinical Narrative:                 ?TOC consulted for patient with [place] needs and substance abuse resources. CM at bedside to offer choice for home health agency. Patient reviewed medicare.gov list and does not have a preference for [facility] agency. [place] referral has been [facility] by [facility] with [facility]. AVS has been updated.  ? ?Expected Discharge Plan: [place] ?Barriers to Discharge: Continued Medical Work up ? ? ?Patient Goals and CMS Choice ?Patient states their goals for this hospitalization and ongoing recovery are:: Wants to get better to go home ?CMS Medicare.gov Compare Post Acute Care list provided to:: Patient ?Choice offered to / list presented to : Patient ? ?Expected Discharge Plan and Services ?Expected Discharge Plan: [place] ?In-house Referral: NA ?Discharge Planning Services: CM Consult ?Post Acute Care Choice: NA ?Living arrangements for the past 2 months: Apartment ?                ?DME Arranged: N/A ?  ?  ?  ?  ?  ?  ?  ?  ?  ? ?Prior Living Arrangements/Services ?Living arrangements for the past 2 months: Apartment ?Lives with:: Self ?Patient language and need for interpreter reviewed:: Yes ?Do you feel safe going back to the place where you live?: Yes      ?Need for Family Participation in Patient Care: Yes (Comment) ?Care giver support system in place?: Yes (comment) ?Current home services: DME (walker and oxygen) ?Criminal Activity/Legal Involvement Pertinent to Current Situation/Hospitalization: No - Comment as needed ? ?Activities of Daily Living ?  ?ADL Screening (condition at time of admission) ?Patient's cognitive ability adequate to safely complete daily activities?: Yes ?Is the patient deaf or have  difficulty hearing?: No ?Does the patient have difficulty seeing, even when wearing glasses/contacts?: No ?Does the patient have difficulty concentrating, remembering, or making decisions?: No ?Patient able to express need for assistance with ADLs?: Yes ?Does the patient have difficulty dressing or bathing?: No ?Independently performs ADLs?: Yes (appropriate for developmental age) ?Does the patient have difficulty walking or climbing stairs?: Yes ?Weakness of Legs: Both ?Weakness of Arms/Hands: None ? ?Permission Sought/Granted ?  ?Permission granted to share information with : No ?   ?   ?   ?   ? ?Emotional Assessment ?Appearance:: Appears older than stated age ?Attitude/Demeanor/Rapport: Gracious, Engaged ?Affect (typically observed): Accepting, Pleasant ?Orientation: : Oriented to Self, Oriented to Place, Oriented to  Time, Oriented to Situation ?Alcohol / Substance Use: Other (comment) (Patient denies alcholol use) ?Psych Involvement: No (comment) ? ?Admission diagnosis:  COPD (chronic obstructive pulmonary disease) ([place]) [J44.9] ?COPD exacerbation ([place]) [J44.1] ?Atrial fibrillation with controlled ventricular response ([place]) [I48.91] ?Acute and chronic respiratory failure with hypoxia ([place]) [J96.21] ?Patient Active Problem List  ? Diagnosis Date Noted  ? Atrial fibrillation with controlled ventricular response ([place])   ? Anxiety 12/31/2020  ? Neck pain 12/31/2020  ? COPD (chronic obstructive pulmonary disease) ([place]) 09/28/2020  ? Protein-calorie malnutrition, severe 09/09/2020  ? COPD with acute exacerbation ([place]) 09/07/2020  ? Malignant neoplasm of right upper lobe of lung ([place]) 05/26/2020  ? Lung mass 05/04/2020  ? Acute on chronic respiratory failure with  hypoxia ([place]) 05/02/2020  ? Pulmonary nodule 02/10/2020  ? Pleural effusion, right   ? S/P CABG x 4 12/16/2019  ? Coronary artery disease 12/16/2019  ? Unstable angina ([place]) 12/12/2019  ? NSTEMI (non-ST elevated myocardial infarction) ([place]) 12/12/2019  ? CAD  (coronary artery disease) 07/11/2018  ? H. pylori infection 07/03/2018  ? Chronic stable angina ([place]) 07/03/2018  ? Duodenal ulcer due to Helicobacter pylori   ? Bilateral AVN of femurs ([place]) 06/25/2018  ? Chronic cough 06/25/2018  ? Mallory-Weiss tear   ? Gastritis and gastroduodenitis   ? Cirrhosis of liver ([place])   ? Tubular adenoma of colon 01/24/2017  ? Epistaxis 01/01/2015  ? COPD exacerbation ([place]) 02/13/2013  ? Ganglion cyst 01/03/2012  ? PERIPHERAL NEUROPATHY 07/15/2010  ? Alcohol use disorder, severe, dependence ([place]) 05/14/2009  ? DEFORMITY, FOOT NEC, CONGENITAL 08/22/2007  ? ALLERGIC RHINITIS, SEASONAL 05/03/2007  ? Hyperlipidemia 12/21/2006  ? Tobacco abuse 12/21/2006  ? COPD with chronic bronchitis ([place]) 12/21/2006  ? ?PCP:  [name], DO ?Pharmacy:   ?[facility], [address] ?[place] ?[place] [address] ?Phone: [phone] Fax: [phone] ? ? ? ? ?Social Determinants of Health (SDOH) Interventions ?  ? ?Readmission Risk Interventions ?Readmission Risk Prevention Plan 10/01/2020 09/10/2020 12/23/2019  ?Transportation Screening Complete Complete Complete  ?PCP or Specialist Appt within 5-7 Days - - Complete  ?Home Care Screening - - Complete  ?Medication Review (RN CM) - - Complete  ?PCP or Specialist appointment within 3-5 days of discharge Complete Complete -  ?[place] or [place] Complete Patient refused -  ?SW Recovery Care/Counseling Consult Complete Complete -  ?Palliative Care Screening Not Applicable Not Applicable -  ?[place] Not Applicable Not Applicable -  ?Some recent data might be hidden  ? ? ? ?

## 2022-01-04 NOTE — TOC Benefit Eligibility Note (Signed)
Patient Advocate Encounter ? ?Insurance verification completed.   ? ?The patient is currently admitted and upon discharge could be taking Eliquis 5 mg. ? ?The current 30 day co-pay is, $10.35.  ? ?The patient is currently admitted and upon discharge could be taking Xarelto 20 mg. ? ?The current 30 day co-pay is, $10.35.  ? ?The patient is currently admitted and upon discharge could be taking Trelegy Ellipta 200-62.5-25 mcg. ? ?The current 30 day co-pay is, $10.35.  ? ?The patient is insured through [employer] Part D  ? ? ? ?Lyndel Safe, CPhT ?Pharmacy Patient Advocate Specialist ?[facility] Patient Advocate Team ?Direct Number: [phone]  Fax: [phone] ? ? ? ? ? ?  ?

## 2022-01-04 NOTE — Progress Notes (Signed)
Pt refused BiPAP tonight. Stated he will call if he wanted it. ?

## 2022-01-04 NOTE — Progress Notes (Signed)
? ?Progress Note ? ?Patient Name: Lucas [name] ?Date of Encounter: 01/04/2022 ? ?[facility] Cardiologist: Peter Martinique, MD  ? ?Subjective  ? ?Patient is hesitant to start oral anticoagulation. No chest pain or dyspnea.  ? ?Inpatient Medications  ?  ?Scheduled Meds: ? folic acid  1 mg Oral Daily  ? ipratropium-albuterol  3 mL Nebulization TID  ? mouth rinse  15 mL Mouth Rinse BID  ? metoprolol tartrate  25 mg Oral BID  ? multivitamin with minerals  1 tablet Oral Daily  ? predniSONE  40 mg Oral Q breakfast  ? rosuvastatin  40 mg Oral Daily  ? thiamine  100 mg Oral Daily  ? Or  ? thiamine  100 mg Intravenous Daily  ? umeclidinium-vilanterol  1 puff Inhalation Daily  ? ?Continuous Infusions: ? heparin 1,100 Units/hr (01/04/22 0546)  ? ?PRN Meds: ?acetaminophen **OR** acetaminophen, hydrOXYzine  ? ?Vital Signs  ?  ?Vitals:  ? 01/04/22 0018 01/04/22 0416 01/04/22 6384 01/04/22 0923  ?BP:  (!) 144/81 125/77   ?Pulse:  95 91   ?Resp:  20 20   ?Temp:  98 ?F (36.7 ?C) 98.7 ?F (37.1 ?C)   ?TempSrc:  Oral Oral   ?SpO2: 98% 94% 98% 95%  ?Weight:      ?Height:      ? ? ?Intake/Output Summary (Last 24 hours) at 01/04/2022 1004 ?Last data filed at 01/04/2022 0900 ?Gross per 24 hour  ?Intake 120 ml  ?Output 1775 ml  ?Net -1655 ml  ? ?Last 3 Weights 01/03/2022 11/26/2021 12/28/2020  ?Weight (lbs) 145 lb 134 lb 7.7 oz 133 lb 6 oz  ?Weight (kg) 65.772 kg 61 kg 60.499 kg  ?   ? ?Telemetry  ?  ?Sinus rhythm at 90s - Personally Reviewed ? ?ECG  ?  ?N/a ? ?Physical Exam  ? ?GEN: No acute distress.   ?Neck: No JVD ?Cardiac: RRR, no murmurs, rubs, or gallops.  ?Respiratory: scattered wheezing ?GI: Soft, nontender, non-distended  ?MS: No edema; No deformity. ?Neuro:  Nonfocal  ?Psych: Normal affect  ? ?Labs  ?  ?High Sensitivity Troponin:   ?Recent Labs  ?Lab 01/03/22 ?0530 01/03/22 ?0739 01/03/22 ?1026 01/03/22 ?1251  ?TROPONINIHS 23* 71* 77* 48*  ?   ?Chemistry ?Recent Labs  ?Lab 01/03/22 ?0530 01/03/22 ?5364 01/03/22 ?2151 01/04/22 ?6803  ?NA  139 135  --  133*  ?K 4.6 4.2  --  3.9  ?CL 96*  --   --  94*  ?CO2 29  --   --  29  ?GLUCOSE 132*  --   --  131*  ?BUN 8  --   --  11  ?CREATININE 1.03  --   --  0.87  ?CALCIUM 9.6  --   --  9.7  ?MG 2.3  --  2.0  --   ?PROT  --   --   --  7.3  ?ALBUMIN  --   --   --  3.8  ?AST  --   --   --  71*  ?ALT  --   --   --  45*  ?ALKPHOS  --   --   --  76  ?BILITOT  --   --   --  0.6  ?GFRNONAA >60  --   --  >60  ?ANIONGAP 14  --   --  10  ?  ?Lipids  ?Recent Labs  ?Lab 01/04/22 ?2122  ?CHOL 152  150  ?TRIG 41  38  ?  HDL 102  101  ?LDLCALC 42  41  ?CHOLHDL 1.5  1.5  ?  ?Hematology ?Recent Labs  ?Lab 01/03/22 ?0530 01/03/22 ?8295 01/04/22 ?6213  ?WBC 7.0  --  2.9*  ?RBC 4.36  --  3.91*  ?HGB 13.9 13.9 12.1*  ?HCT 42.7 41.0 37.0*  ?MCV 97.9  --  94.6  ?MCH 31.9  --  30.9  ?MCHC 32.6  --  32.7  ?RDW 14.0  --  14.0  ?PLT 174  --  156  ? ? ?Radiology  ?  ?CT Angio Chest Pulmonary Embolism (PE) W or WO Contrast ? ?Result Date: 01/03/2022 ?CLINICAL DATA:  Respiratory distress. EXAM: CT ANGIOGRAPHY CHEST WITH CONTRAST TECHNIQUE: Multidetector CT imaging of the chest was performed using the standard protocol during bolus administration of intravenous contrast. Multiplanar CT image reconstructions and MIPs were obtained to evaluate the vascular anatomy. RADIATION DOSE REDUCTION: This exam was performed according to the departmental dose-optimization program which includes automated exposure control, adjustment of the mA and/or kV according to patient size and/or use of iterative reconstruction technique. CONTRAST:  172mL OMNIPAQUE IOHEXOL 350 MG/ML SOLN COMPARISON:  08/24/2021 FINDINGS: Cardiovascular: The heart size is normal. No substantial pericardial effusion. Coronary artery calcification is evident. Mild atherosclerotic calcification is noted in the wall of the thoracic aorta. There is no filling defect within the opacified pulmonary arteries to suggest the presence of an acute pulmonary embolus. Mediastinum/Nodes: No  mediastinal lymphadenopathy. There is no hilar lymphadenopathy. The esophagus has normal imaging features. There is no axillary lymphadenopathy. Lungs/Pleura: Centrilobular and paraseptal emphysema evident. Spiculated irregular right upper lobe nodular opacity with associated fiducial markers measures 3.4 x 2.5 cm today compared to 3.5 x 2.8 cm previously (remeasured). No new suspicious pulmonary nodule or mass. No focal airspace consolidation. No pleural effusion. Upper Abdomen: The liver shows diffusely decreased attenuation suggesting fat deposition. Musculoskeletal: No worrisome lytic or sclerotic osseous abnormality. Posttraumatic deformity noted multiple right ribs. Review of the MIP images confirms the above findings. IMPRESSION: 1. No CT evidence for acute pulmonary embolus. 2. Similar appearance of the spiculated right upper lobe nodular opacity with associated fiducial markers. Lesion appears somewhat more confluent on today's study which could represent evolution of post treatment change but close attention on follow-up recommended. 3. Hepatic steatosis. 4. Aortic Atherosclerosis (ICD10-I70.0) and Emphysema (ICD10-J43.9). Electronically Signed   By: Misty Stanley M.D.   On: 01/03/2022 11:24  ? ?DG Chest Port 1 View ? ?Result Date: 01/03/2022 ?CLINICAL DATA:  Shortness of breath. EXAM: PORTABLE CHEST 1 VIEW COMPARISON:  11/26/2021 FINDINGS: Heart size and mediastinal contours are stable. Previous median sternotomy and CABG procedure. Post treatment changes within the right upper lung are identified in the region of fiducial markers. Chronic blunting of the right costophrenic angle is identified likely reflecting pleuroparenchymal scarring. No superimposed interstitial or airspace opacities. IMPRESSION: 1. No acute cardiopulmonary abnormalities. 2. Stable post treatment changes within the right upper lung. Electronically Signed   By: Kerby Moors M.D.   On: 01/03/2022 06:00  ? ?ECHOCARDIOGRAM  COMPLETE ? ?Result Date: 01/03/2022 ?   ECHOCARDIOGRAM REPORT   Patient Name:   Lucas [name] Date of Exam: 01/03/2022 Medical Rec #:  [id number]    Height:       71.0 in Accession #:    [id number]   Weight:       145.0 lb Date of Birth:  06-12-1951    BSA:          1.839 m? Patient Age:  70 years     BP:           134/77 mmHg Patient Gender: M            HR:           89 bpm. Exam Location:  Inpatient Procedure: 2D Echo, Cardiac Doppler and Color Doppler Indications:    A-fib, respiratory distress  History:        Patient has prior history of Echocardiogram examinations, most                 recent 12/12/2019. Risk Factors:Dyslipidemia and Hypertension.  Sonographer:    Maudry Mayhew MHA, RDMS, RVT, RDCS Referring Phys: [id number] Godfrey Pick  Sonographer Comments: Patient sitting upright, on Bipap. IMPRESSIONS  1. Left ventricular ejection fraction, by estimation, is 60 to 65%. The left ventricle has normal function. The left ventricle has no regional wall motion abnormalities. Left ventricular diastolic parameters are consistent with Grade I diastolic dysfunction (impaired relaxation).  2. Right ventricular systolic function is normal. The right ventricular size is normal.  3. The mitral valve is normal in structure. No evidence of mitral valve regurgitation. No evidence of mitral stenosis.  4. The aortic valve was not well visualized. Aortic valve regurgitation is not visualized. No aortic stenosis is present. Aortic valve area, by VTI measures 2.65 cm?Marland Kitchen Aortic valve mean gradient measures 3.0 mmHg. Aortic valve Vmax measures 1.19 m/s.  5. The inferior vena cava is normal in size with <50% respiratory variability, suggesting right atrial pressure of 8 mmHg. FINDINGS  Left Ventricle: Left ventricular ejection fraction, by estimation, is 60 to 65%. The left ventricle has normal function. The left ventricle has no regional wall motion abnormalities. The left ventricular internal cavity size was normal in size. There  is  no left ventricular hypertrophy. Left ventricular diastolic parameters are consistent with Grade I diastolic dysfunction (impaired relaxation). Normal left ventricular filling pressure. Right Ventricle: The right ventr

## 2022-01-04 NOTE — Progress Notes (Signed)
Initial Nutrition Assessment ? ?DOCUMENTATION CODES:  ? ?Non-severe (moderate) malnutrition in context of chronic illness ? ?INTERVENTION:  ? ?- Ensure Enlive po TID, each supplement provides 350 kcal and 20 grams of protein ? ?- Liberalize diet to Regular to promote PO intake ? ?- Continue MVI with minerals daily ? ?NUTRITION DIAGNOSIS:  ? ?Moderate Malnutrition related to chronic illness (COPD, cirrhosis) as evidenced by moderate fat depletion, severe muscle depletion. ? ?GOAL:  ? ?Patient will meet greater than or equal to 90% of their needs ? ?MONITOR:  ? ?PO intake, Supplement acceptance, Labs, Weight trends, I & O's ? ?REASON FOR ASSESSMENT:  ? ?Consult ?COPD Protocol ? ?ASSESSMENT:  ? ?71 year old male who presented to the ED on 3/13 with SOB. PMH of COPD, HLD, HTN, CAD s/p CABG x 4, tobacco abuse, EtOH abuse, RUL squamous cell carcinoma s/p SBRT, liver cirrhosis, Mallory-Weiss tear 2019. Pt admitted with COPD exacerbation. ? ?Spoke with pt and wife at bedside. Pt reports good appetite but dislike of the food here at the hospital due to lack of flavor. Noted 100% completed lunch meal tray at bedside (egg salad, apple cobbler, soda, milk) which provided 521 kcal and 17 grams of protein. RD to liberalize diet to Regular to provide more food options and promote PO intake given malnutrition diagnosis. ? ?Pt reports that he has an excellent appetite at home and eats "all day." Pt reports that for breakfast he eats shrimp and grits. He will often have shrimp and grits for lunch as well. Dinner includes something that his wife makes like a beef pot roast with potatoes and vegetables. Pt snacks on potato chips. ? ?Pt reports a UBW of 150 lbs. He states that he has never been a big man. He reports a little bit of weight loss and that now he weighs 145 lbs. Reviewed weight history in chart. Pt's weight has trended up over the last year from 60.5 kg on 12/28/20 to 61 kg on 11/26/21 to 65.8 kg at present. However, weight on  admission appears stated rather than measured so may not be accurate. ? ?Pt meets criteria for malnutrition. Pt willing to consume oral nutrition supplements during admission. He states that he drinks Ensure sometimes at home and prefers the vanilla flavor. RD to order. ? ?Meal Completion: 100% x 1 documented meal ? ?Medications reviewed and include: folic acid, MVI with minerals, prednisone, thiamine ? ?Labs reviewed: sodium 133, ionized calcium 1.14, elevated LFTs ? ?UOP: 1475 ml x 24 hours ?I/O's: -1.0 L since admit ? ?NUTRITION - FOCUSED PHYSICAL EXAM: ? ?Flowsheet Row Most Recent Value  ?Orbital Region Severe depletion  ?Upper Arm Region Moderate depletion  ?Thoracic and Lumbar Region Moderate depletion  ?Buccal Region Moderate depletion  ?Temple Region Mild depletion  ?Clavicle Bone Region Severe depletion  ?Clavicle and Acromion Bone Region Severe depletion  ?Scapular Bone Region Moderate depletion  ?Dorsal Hand Moderate depletion  ?Patellar Region Moderate depletion  ?Anterior Thigh Region Severe depletion  ?Posterior Calf Region Severe depletion  ?Edema (RD Assessment) None  ?Hair Reviewed  ?Eyes Reviewed  ?Mouth Reviewed  ?Skin Reviewed  ?Nails Reviewed  ? ?  ? ? ?Diet Order:   ?Diet Order   ? ?       ?  Diet regular Room service appropriate? Yes; Fluid consistency: Thin  Diet effective now       ?  ? ?  ?  ? ?  ? ? ?EDUCATION NEEDS:  ? ?Education needs have been addressed ? ?  Skin:  Skin Assessment: Reviewed RN Assessment ? ?Last BM:  01/03/22 ? ?Height:  ? ?Ht Readings from Last 1 Encounters:  ?01/03/22 5\' 11"  (1.803 m)  ? ? ?Weight:  ? ?Wt Readings from Last 1 Encounters:  ?01/03/22 65.8 kg  ? ? ?BMI:  Body mass index is 20.22 kg/m?. ? ?Estimated Nutritional Needs:  ? ?Kcal:  1900-2100 ? ?Protein:  90-105 grams ? ?Fluid:  >/= 1.8 L ? ? ? ?Gustavus Bryant, MS, RD, LDN ?Inpatient Clinical Dietitian ?Please see AMiON for contact information. ? ?

## 2022-01-04 NOTE — Progress Notes (Signed)
FPTS Interim Night Progress Note ? ?S:Patient sleeping comfortably.  Rounded with primary night RN. Reports CIWA scores 0. No concerns voiced.  No orders required.   ? ?O: ?Today's Vitals  ? 01/03/22 2000 01/03/22 2310 01/04/22 0018 01/04/22 0416  ?BP: (!) 146/119 125/77  (!) 144/81  ?Pulse: 94 78  95  ?Resp: 20 19  20   ?Temp:  98.7 ?F (37.1 ?C)  98 ?F (36.7 ?C)  ?TempSrc:  Oral  Oral  ?SpO2: 96% 96% 98% 94%  ?Weight:      ?Height:      ?PainSc: 0-No pain 0-No pain  0-No pain  ? ? ? ? ?A/P: ? ?PAfib/RVR ?Hemodynamically stable and now in SR on monitor.  Tolerating BIPAP . No Chest pain overnight.   ?-Cardiology following ?-Continue Heparin drip  ?-Continue Metoprolol 25 mg BID ? ?EtOH ?CIWAs overnight 0 ?Continue to monitor for withdrawal, unknown last EtOH. ?Monitor electrolytes and replete as needed ? ?Carollee Leitz MD ?PGY-3, [facility] ?Service pager [phone]   ?

## 2022-01-04 NOTE — TOC CAGE-AID Note (Signed)
Transition of Care (TOC) - CAGE-AID Screening ? ? ?Patient Details  ?Name: Lucas [name] ?MRN: [id number] ?Date of Birth: 27-Nov-1950 ? ?Transition of Care (TOC) CM/SW Contact:    ?Angelita Ingles, RN ?Phone [phone] ? ?01/04/2022, 10:27 AM ? ? ?Clinical Narrative: ?Patient refused stating that he is not an alcoholic and he doesn't need to answer the questions. Refused resources.  ? ? ?CAGE-AID Screening: ?Substance Abuse Screening unable to be completed due to: : Patient Refused ? ?  ?  ?  ?  ?  ? ?Substance Abuse Education Offered: Yes ? ?Substance abuse interventions: Other (must comment) (Patient refused stating that he in not an alcholic and doesnt need to answer the questions) ? ? ? ? ? ? ?

## 2022-01-05 ENCOUNTER — Other Ambulatory Visit (HOSPITAL_COMMUNITY): Payer: Self-pay

## 2022-01-05 DIAGNOSIS — J9621 Acute and chronic respiratory failure with hypoxia: Secondary | ICD-10-CM

## 2022-01-05 MED ORDER — PREDNISONE 20 MG PO TABS
40.0000 mg | ORAL_TABLET | Freq: Every day | ORAL | 0 refills | Status: AC
  Filled 2022-01-05: qty 4, 2d supply, fill #0

## 2022-01-05 MED ORDER — FLUTICASONE-UMECLIDIN-VILANT 100-62.5-25 MCG/ACT IN AEPB: 1.0000 | INHALATION_SPRAY | Freq: Every day | RESPIRATORY_TRACT | Status: DC

## 2022-01-05 MED ORDER — TRELEGY ELLIPTA 100-62.5-25 MCG/ACT IN AEPB
1.0000 | INHALATION_SPRAY | Freq: Every day | RESPIRATORY_TRACT | 3 refills | Status: AC
  Filled 2022-01-05: qty 60, 30d supply, fill #0

## 2022-01-05 NOTE — Discharge Summary (Signed)
Family Medicine Teaching Service ?Hospital Discharge Summary ? ?Patient name: Lucas [name] Medical record number: [id number] ?Date of birth: July 04, 1951 Age: 71 y.o. Gender: male ?Date of Admission: 01/03/2022  Date of Discharge: 01/05/22 ?Admitting Physician: Gifford Shave, MD ? ?Primary Care Provider: [name], DO ?Consultants: Cardiology ? ?Indication for Hospitalization: COPD Exacerbation ? ?Discharge Diagnoses/Problem List:  ?COPD ?Paroxysmal A-fib with RVR ?CAD ?Alcohol abuse ?Liver cirrhosis ?Hypertension ?Hyperlipidemia ?Tobacco use disorder ? ?Disposition: Home with home health ? ?Discharge Condition: stable ? ?Discharge Exam:  ?General: NAD, alert. On 3 L oxygen [place] ?Cardiovascular: RRR ?Respiratory: CTAB ?Abdomen: nontender, soft ?Extremities: able to move all extremities ? ?Brief Hospital Course:  ?Shortness of breath ?COPD ?Patient woke up with sudden shortness of breath.  Patient reports good compliance with COPD medications.  On arrival patient's O2 sat was 60% on 2 L nasal cannula so he was put on nonrebreather and then CPAP by EMS with improvement of oxygen saturation.  On arrival patient was switched to BiPAP and given mag as well as Solu-Medrol.  Chest x-ray shows no cardiopulmonary abnormalities.  It showed stable posttreatment changes right upper lung.  CT PE was performed to assess for pulmonary embolism but no filling defects were found.  ABG shows PCO2 of 47.4, normal pH, bicarb 30.8.  Patient was weaned back to home oxygen of 3 L.  Patient was switched from [facility] daily to Trelegy for discharge as patient reports shortness of breath was not sufficiently helping with Anoro Ellipta.  ? ? ?Paroxysmal atrial fibrillation with RVR ?EKG showed A-fib with RVR.  Patient was put on a heparin drip but d/c after patient reports he has had significant bleeding with even baby aspirin.  Cardiology was consulted and after given IV Lopressor 2.5 mg, patient converted back to sinus rhythm.  Patient  was continued on metoprolol tartrate 25 mg twice daily.  Echo showed preserved LVEF.  Patient has opted to not be placed on long-term anticoagulation given bleeding risk. ? ?History of squamous cell carcinoma, RUL, s/p SBRT ?CT PE was negative for PE and recommended patient follow-up with radiology/oncology after discharge. ? ?Alcohol abuse  liver cirrhosis  history of Mallory-Weiss tear ?Patient was placed on CIWA's without Ativan.  Patient denies having significant alcohol withdrawal symptoms.  Patient has had CIWA scores between 2 and 4 while in the hospital.  Did not require Ativan this hospitalization. ? ?Patient's other chronic conditions were stable and managed with home medications including: Hypertension, hyperlipidemia, tobacco use, alcohol abuse. ? ?Follow up:  ?-CTA PE: No acute pulmonary embolus. Similar appearance of the spiculated right upper lobe nodular opacity with associated fiducial markers. Lesion appears somewhat more confluent on today's study which could represent evolution of post treatment change but close attention on follow-up recommended. ?-Eliquis discussion, going to the AFIB clinic  ? ? ?Significant Procedures: none ? ?Significant Labs and Imaging:  ?Recent Labs  ?Lab 01/03/22 ?0530 01/03/22 ?7616 01/04/22 ?0737  ?WBC 7.0  --  2.9*  ?HGB 13.9 13.9 12.1*  ?HCT 42.7 41.0 37.0*  ?PLT 174  --  156  ? ?Recent Labs  ?Lab 01/03/22 ?0530 01/03/22 ?1062 01/03/22 ?2151 01/04/22 ?6948  ?NA 139 135  --  133*  ?K 4.6 4.2  --  3.9  ?CL 96*  --   --  94*  ?CO2 29  --   --  29  ?GLUCOSE 132*  --   --  131*  ?BUN 8  --   --  11  ?CREATININE  1.03  --   --  0.87  ?CALCIUM 9.6  --   --  9.7  ?MG 2.3  --  2.0  --   ?ALKPHOS  --   --   --  55  ?AST  --   --   --  71*  ?ALT  --   --   --  45*  ?ALBUMIN  --   --   --  3.8  ? ? ? ? ?Results/Tests Pending at Time of Discharge: none ? ?Discharge Medications:  ?Allergies as of 01/05/2022   ? ?   Reactions  ? Ace Inhibitors Swelling  ? Ampicillin Nausea And  Vomiting  ? Penicillins Nausea And Vomiting  ? Did it involve swelling of the face/tongue/throat, SOB, or low BP? N ?Did it involve sudden or severe rash/hives, skin peeling, or any reaction on the inside of your mouth or nose? N ?Did you need to seek medical attention at a hospital or doctor's office? N ?When did it last happen?   Teenager    ?If all above answers are ?NO?, may proceed with cephalosporin use.  ? Codeine Nausea And Vomiting  ? Lisinopril Swelling  ? REACTION: angioedema  ? Aspirin   ? Nose bleeds  ? ?  ? ?  ?Medication List  ?  ? ?STOP taking these medications   ? ?Anoro Ellipta 62.5-25 MCG/ACT Aepb ?Generic drug: umeclidinium-vilanterol ?  ? ?  ? ?TAKE these medications   ? ?albuterol 108 (90 Base) MCG/ACT inhaler ?Commonly known as: VENTOLIN HFA ?INHALE 2 PUFFS BY MOUTH EVERY 4 HOURS FOR WHEEZE AND SHORTNESS OF BREATH ?What changed:  ?how much to take ?how to take this ?when to take this ?reasons to take this ?additional instructions ?  ?diclofenac Sodium 1 % Gel ?Commonly known as: Voltaren ?Apply 2 g topically 4 (four) times daily. ?What changed:  ?when to take this ?reasons to take this ?  ?hydrOXYzine 10 MG tablet ?Commonly known as: ATARAX ?TAKE 1 TABLET(10 MG) BY MOUTH EVERY 8 HOURS AS NEEDED FOR ANXIETY ?What changed: See the new instructions. ?  ?ibuprofen 200 MG tablet ?Commonly known as: ADVIL ?Take 200 mg by mouth every 6 (six) hours as needed for mild pain or headache. ?  ?ipratropium-albuterol 0.5-2.5 (3) MG/3ML Soln ?Commonly known as: DUONEB ?Take 3 mLs by nebulization every 6 (six) hours as needed. ?What changed: reasons to take this ?  ?metoprolol tartrate 25 MG tablet ?Commonly known as: LOPRESSOR ?TAKE 1 TABLET(25 MG) BY MOUTH TWICE DAILY ?What changed: See the new instructions. ?  ?nitroGLYCERIN 0.4 MG SL tablet ?Commonly known as: NITROSTAT ?Place 1 tablet (0.4 mg total) under the tongue every 5 (five) minutes as needed for chest pain. ?  ?OXYGEN ?Inhale 3 L into the lungs See  admin instructions. Continuous at night, as needed during the day ?  ?oxymetazoline 0.05 % nasal spray ?Commonly known as: AFRIN ?Place 1 spray into both nostrils 2 (two) times daily as needed for congestion (nose bleeds). ?  ?predniSONE 20 MG tablet ?Commonly known as: DELTASONE ?Take 2 tablets (40 mg total) by mouth daily with breakfast for 2 doses. ?  ?rosuvastatin 40 MG tablet ?Commonly known as: CRESTOR ?Take 40 mg by mouth daily. ?  ?Trelegy Ellipta 100-62.5-25 MCG/ACT Aepb ?Generic drug: Fluticasone-Umeclidin-Vilant ?Inhale 1 puff into the lungs daily. ?  ? ?  ? ? ?Discharge Instructions: Please refer to Patient Instructions section of EMR for full details.  Patient was counseled important signs and symptoms  that should prompt return to medical care, changes in medications, dietary instructions, activity restrictions, and follow up appointments.  ? ?Follow-Up Appointments: ? Follow-up Information   ? ? [facility], [facility] Follow up.   ?Specialty: Home Health Services ?Why: Your home health has been set up with [facility]. The office will call you with start of care information. If you have any questions or concerns please call the number listed above. ?Contact information: ?[address] ?[address] ?[place] [address] ?[phone] ? ? ?  ?  ? ?  ?  ? ?  ? ? ?France Ravens, MD ?01/05/2022, 10:54 AM ?PGY-1, [facility] ?

## 2022-01-05 NOTE — Progress Notes (Signed)
?  Progress Note  ? ?Date: 01/05/2022 ? ?Patient Name: Lucas [name]        ?MRN#: [id number] ? ?Clarification of diagnosis: ? ?moderate protein calorie malnutrition in the settings of chronic illness. ? ? ? ?

## 2022-01-05 NOTE — TOC Transition Note (Signed)
Transition of Care (TOC) - CM/SW Discharge Note ? ? ?Patient Details  ?Name: Lucas [name] ?MRN: [id number] ?Date of Birth: July 20, 1951 ? ?Transition of Care (TOC) CM/SW Contact:  ?Angelita Ingles, RN ?Phone Number:[phone] ? ?01/05/2022, 12:40 PM ? ? ?Clinical Narrative:    ?Patient discharging home. Home health has been set up with [facility]. No other needs noted. TOC will sign off.  ? ? ?Final next level of care: [place] ?Barriers to Discharge: No Barriers Identified ? ? ?Patient Goals and CMS Choice ?Patient states their goals for this hospitalization and ongoing recovery are:: Wants to get better to go home ?CMS Medicare.gov Compare Post Acute Care list provided to:: Patient ?Choice offered to / list presented to : Patient ? ?Discharge Placement ?  ?           ?  ?  ?  ?  ? ?Discharge Plan and Services ?In-house Referral: NA ?Discharge Planning Services: CM Consult ?Post Acute Care Choice: NA          ?DME Arranged: N/A ?DME Agency: NA ?  ?  ?  ?HH Arranged: PT ?[place] Agency: [facility] ?Date HH Agency Contacted: 01/05/22 ?Time [place]: 5009 ?Representative spoke with at [place]: Stacie updated to make aware of d/c today ? ?Social Determinants of Health (SDOH) Interventions ?  ? ? ?Readmission Risk Interventions ?Readmission Risk Prevention Plan 10/01/2020 09/10/2020 12/23/2019  ?Transportation Screening Complete Complete Complete  ?PCP or Specialist Appt within 5-7 Days - - Complete  ?Home Care Screening - - Complete  ?Medication Review (RN CM) - - Complete  ?PCP or Specialist appointment within 3-5 days of discharge Complete Complete -  ?[place] or [place] Complete Patient refused -  ?SW Recovery Care/Counseling Consult Complete Complete -  ?Palliative Care Screening Not Applicable Not Applicable -  ?[place] Not Applicable Not Applicable -  ?Some recent data might be hidden  ? ? ? ? ? ?

## 2022-01-05 NOTE — Discharge Instructions (Addendum)
Thank you for letting us care for you during your stay. ? ?You were admitted to the [facility] Medicine Teaching Service.  ? ?You were admitted for atrial fibrillation and  shortness of breath likely due to your COPD.  ? ?Please follow up with your primary care physician in 1 week.  ? ?If your symptoms worsen or return, please return to the hospital. ? ?Please let us know if you have questions about your stay at [facility].  ? ?Take care! ?-[facility] Teaching Services ?

## 2022-01-05 NOTE — Progress Notes (Signed)
Physical Therapy Treatment ?Patient Details ?Name: Lucas [name] ?MRN: [id number] ?DOB: October 27, 1950 ?Today's Date: 01/05/2022 ? ? ?History of Present Illness 71 y.o. male presenting with shortness of breath. PMH is significant for COPD, HLD, HTN, alcohol abuse, tobacco use. ? ?  ?PT Comments  ? ? Pt received in supine, agreeable to therapy session and with good participation and tolerance for household distance gait task using RW. Emphasis on compliance with O2, especially with exertional tasks such as walking and transfers, use of assistive device for safety (pt disinclined to use at home per his report), benefits of mobility, and activity pacing. Pt continues to benefit from PT services to progress toward functional mobility goals. Pt may benefit from further oversight at home to assist with O2 management and ensure compliance with O2 and RW use, he reports extended family members who work in healthcare who may be able to assist with this.  ?Recommendations for follow up therapy are one component of a multi-disciplinary discharge planning process, led by the attending physician.  Recommendations may be updated based on patient status, additional functional criteria and insurance authorization. ? ?Follow Up Recommendations ? Home health PT ?  ?  ?Assistance Recommended at Discharge Intermittent Supervision/Assistance  ?Patient can return home with the following A little help with walking and/or transfers;A little help with bathing/dressing/bathroom;Assistance with cooking/housework;Assist for transportation;Help with stairs or ramp for entrance;Direct supervision/assist for medications management ?  ?Equipment Recommendations ? None recommended by PT  ?  ?Recommendations for Other Services   ? ? ?  ?Precautions / Restrictions Precautions ?Precautions: Fall;Other (comment) ?Precaution Comments: watch SpO2 ?Restrictions ?Weight Bearing Restrictions: No  ?  ? ?Mobility ? Bed Mobility ?Overal bed mobility: Modified  Independent ?  ?  ?  ?  ?  ?  ?General bed mobility comments: Increased time for bed mobility, HOB elevated ?  ? ?Transfers ?Overall transfer level: Needs assistance ?Equipment used: None ?Transfers: Sit to/from Stand ?Sit to Stand: Supervision ?  ?  ?  ?  ?  ?General transfer comment: Supervision for safety, pt mildly impulsive needs cues for line awareness ?  ? ?Ambulation/Gait ?Ambulation/Gait assistance: Supervision ?Gait Distance (Feet): 125 Feet ?Assistive device: Rolling walker (2 wheels) ?Gait Pattern/deviations: Step-through pattern, Decreased stride length, Trunk flexed ?  ?  ?  ?General Gait Details: fairly stable with RW, increase L/R drift at times but no overt LOB; fair pace, SpO2 and HR WFL on baseline 3L O2 [place] ? ? ?Stairs ?Stairs:  (verbal review for step sequencing, pt reports only 1-2" curb to ascend and defers to attempt 7" step.) ?  ?  ?  ?  ? ? ? ?  ?Balance Overall balance assessment: Needs assistance ?Sitting-balance support: Bilateral upper extremity supported, Feet unsupported ?Sitting balance-Leahy Scale: Good ?Sitting balance - Comments: using urinal seated EOB no difficulty ?  ?Standing balance support: Bilateral upper extremity supported ?Standing balance-Leahy Scale: Fair ?Standing balance comment: reliant on RW for longer gait task ?  ?  ?  ?  ?  ?  ?  ?  ?  ?  ?  ?  ? ?  ?Cognition Arousal/Alertness: Awake/alert ?Behavior During Therapy: [facility] for tasks assessed/performed ?Overall Cognitive Status: Within Functional Limits for tasks assessed ?  ?  ?  ?  ?  ?  ?  ?  ?  ?  ?  ?  ?  ?  ?  ?  ?General Comments: Feel pt is at baseline cognition. Slightly impulsive with decreased awareness of  safety and reports he may not use his O2 consistently at home due to difficulty switching/refilling tanks. He reports he has a niece who can help with this who is Therapist, sports but she works. He also reports he may not use RW at home although he has one. Encouraged compliance with DME to reduce fall risk and O2  compliance to prevent readmission to hospital/independence at home. ?  ?  ? ?  ?Exercises   ? ?  ?General Comments General comments (skin integrity, edema, etc.): HR 80's-90's bpm SpO2 93-97% on 3L O2 [place]; BP stable per AM chart review and no dizziness reported ?  ?  ? ?Pertinent Vitals/Pain Pain Assessment ?Pain Assessment: No/denies pain  ? ? ?Home Living   ?Prior Function   ? ?PT Goals (current goals can now be found in the care plan section) Acute Rehab PT Goals ?Patient Stated Goal: Maintain independance ?PT Goal Formulation: With patient ?Time For Goal Achievement: 01/17/22 ?Progress towards PT goals: Progressing toward goals ? ?  ?Frequency ? ? ? Min 3X/week ? ? ? ?  ?PT Plan Current plan remains appropriate  ? ? ?   ?AM-PAC PT "6 Clicks" Mobility   ?Outcome Measure ? Help needed turning from your back to your side while in a flat bed without using bedrails?: None ?Help needed moving from lying on your back to sitting on the side of a flat bed without using bedrails?: None ?Help needed moving to and from a bed to a chair (including a wheelchair)?: A Little ?Help needed standing up from a chair using your arms (e.g., wheelchair or bedside chair)?: A Little ?Help needed to walk in hospital room?: A Little ?Help needed climbing 3-5 steps with a railing? : A Lot ?6 Click Score: 19 ? ?  ?End of Session Equipment Utilized During Treatment: Gait belt;Oxygen (3L) ?Activity Tolerance: Patient tolerated treatment well ?Patient left: in bed;with call bell/phone within reach;with bed alarm set ?Nurse Communication: Mobility status ?PT Visit Diagnosis: Unsteadiness on feet (R26.81);Difficulty in walking, not elsewhere classified (R26.2) ?  ? ? ?Time: 2446-2863 ?PT Time Calculation (min) (ACUTE ONLY): 21 min ? ?Charges:  $Gait Training: 8-22 mins          ?          ? ?Lucas Defalco P., PTA ?Acute Rehabilitation Services ?Pager: [phone] ?Office: [phone]  ? ? ?Lucas [name] ?01/05/2022, 11:26 AM ? ?

## 2022-01-05 NOTE — Progress Notes (Signed)
RT note. ?Patient on 3 L [place] sat 98% with no labored breathing noted. Will place patient on bipap if needed later, RT will continue to monitor ?

## 2022-01-06 DIAGNOSIS — Z9981 Dependence on supplemental oxygen: Secondary | ICD-10-CM | POA: Diagnosis not present

## 2022-01-06 DIAGNOSIS — Z9181 History of falling: Secondary | ICD-10-CM | POA: Diagnosis not present

## 2022-01-06 DIAGNOSIS — I480 Paroxysmal atrial fibrillation: Secondary | ICD-10-CM | POA: Diagnosis not present

## 2022-01-06 DIAGNOSIS — J9620 Acute and chronic respiratory failure, unspecified whether with hypoxia or hypercapnia: Secondary | ICD-10-CM | POA: Diagnosis not present

## 2022-01-06 DIAGNOSIS — E785 Hyperlipidemia, unspecified: Secondary | ICD-10-CM | POA: Diagnosis not present

## 2022-01-06 DIAGNOSIS — Z85118 Personal history of other malignant neoplasm of bronchus and lung: Secondary | ICD-10-CM | POA: Diagnosis not present

## 2022-01-06 DIAGNOSIS — J441 Chronic obstructive pulmonary disease with (acute) exacerbation: Secondary | ICD-10-CM | POA: Diagnosis not present

## 2022-01-06 DIAGNOSIS — I2510 Atherosclerotic heart disease of native coronary artery without angina pectoris: Secondary | ICD-10-CM | POA: Diagnosis not present

## 2022-01-06 DIAGNOSIS — Z7951 Long term (current) use of inhaled steroids: Secondary | ICD-10-CM | POA: Diagnosis not present

## 2022-01-06 DIAGNOSIS — F17210 Nicotine dependence, cigarettes, uncomplicated: Secondary | ICD-10-CM | POA: Diagnosis not present

## 2022-01-06 DIAGNOSIS — J302 Other seasonal allergic rhinitis: Secondary | ICD-10-CM | POA: Diagnosis not present

## 2022-01-06 DIAGNOSIS — G629 Polyneuropathy, unspecified: Secondary | ICD-10-CM | POA: Diagnosis not present

## 2022-01-06 DIAGNOSIS — E46 Unspecified protein-calorie malnutrition: Secondary | ICD-10-CM | POA: Diagnosis not present

## 2022-01-06 DIAGNOSIS — K7460 Unspecified cirrhosis of liver: Secondary | ICD-10-CM | POA: Diagnosis not present

## 2022-01-06 DIAGNOSIS — Z951 Presence of aortocoronary bypass graft: Secondary | ICD-10-CM | POA: Diagnosis not present

## 2022-01-07 ENCOUNTER — Ambulatory Visit: Payer: Medicare Other

## 2022-01-07 ENCOUNTER — Inpatient Hospital Stay (HOSPITAL_COMMUNITY)
Admission: EM | Admit: 2022-01-07 | Discharge: 2022-01-11 | DRG: 190 | Disposition: A | Discharge status: Home Health | Payer: Medicare Other | Attending: Family Medicine | Admitting: Family Medicine

## 2022-01-07 ENCOUNTER — Emergency Department (HOSPITAL_COMMUNITY): Payer: Medicare Other

## 2022-01-07 ENCOUNTER — Other Ambulatory Visit: Payer: Self-pay

## 2022-01-07 ENCOUNTER — Encounter (HOSPITAL_COMMUNITY): Payer: Self-pay | Admitting: Emergency Medicine

## 2022-01-07 DIAGNOSIS — Z951 Presence of aortocoronary bypass graft: Secondary | ICD-10-CM

## 2022-01-07 DIAGNOSIS — E785 Hyperlipidemia, unspecified: Secondary | ICD-10-CM | POA: Diagnosis present

## 2022-01-07 DIAGNOSIS — R6889 Other general symptoms and signs: Secondary | ICD-10-CM | POA: Diagnosis not present

## 2022-01-07 DIAGNOSIS — G629 Polyneuropathy, unspecified: Secondary | ICD-10-CM | POA: Diagnosis present

## 2022-01-07 DIAGNOSIS — F419 Anxiety disorder, unspecified: Secondary | ICD-10-CM | POA: Diagnosis present

## 2022-01-07 DIAGNOSIS — Z85118 Personal history of other malignant neoplasm of bronchus and lung: Secondary | ICD-10-CM

## 2022-01-07 DIAGNOSIS — Z9981 Dependence on supplemental oxygen: Secondary | ICD-10-CM | POA: Diagnosis not present

## 2022-01-07 DIAGNOSIS — J9621 Acute and chronic respiratory failure with hypoxia: Secondary | ICD-10-CM | POA: Diagnosis not present

## 2022-01-07 DIAGNOSIS — I499 Cardiac arrhythmia, unspecified: Secondary | ICD-10-CM | POA: Diagnosis not present

## 2022-01-07 DIAGNOSIS — Z880 Allergy status to penicillin: Secondary | ICD-10-CM

## 2022-01-07 DIAGNOSIS — I480 Paroxysmal atrial fibrillation: Secondary | ICD-10-CM | POA: Diagnosis not present

## 2022-01-07 DIAGNOSIS — F1010 Alcohol abuse, uncomplicated: Secondary | ICD-10-CM | POA: Diagnosis present

## 2022-01-07 DIAGNOSIS — E440 Moderate protein-calorie malnutrition: Secondary | ICD-10-CM | POA: Diagnosis not present

## 2022-01-07 DIAGNOSIS — R64 Cachexia: Secondary | ICD-10-CM | POA: Diagnosis not present

## 2022-01-07 DIAGNOSIS — J9622 Acute and chronic respiratory failure with hypercapnia: Secondary | ICD-10-CM | POA: Diagnosis present

## 2022-01-07 DIAGNOSIS — Z681 Body mass index (BMI) 19 or less, adult: Secondary | ICD-10-CM

## 2022-01-07 DIAGNOSIS — J449 Chronic obstructive pulmonary disease, unspecified: Secondary | ICD-10-CM | POA: Diagnosis present

## 2022-01-07 DIAGNOSIS — Z825 Family history of asthma and other chronic lower respiratory diseases: Secondary | ICD-10-CM

## 2022-01-07 DIAGNOSIS — K7460 Unspecified cirrhosis of liver: Secondary | ICD-10-CM | POA: Diagnosis present

## 2022-01-07 DIAGNOSIS — R0602 Shortness of breath: Secondary | ICD-10-CM | POA: Diagnosis not present

## 2022-01-07 DIAGNOSIS — I2510 Atherosclerotic heart disease of native coronary artery without angina pectoris: Secondary | ICD-10-CM | POA: Diagnosis not present

## 2022-01-07 DIAGNOSIS — J441 Chronic obstructive pulmonary disease with (acute) exacerbation: Secondary | ICD-10-CM | POA: Diagnosis not present

## 2022-01-07 DIAGNOSIS — Z885 Allergy status to narcotic agent status: Secondary | ICD-10-CM

## 2022-01-07 DIAGNOSIS — J9811 Atelectasis: Secondary | ICD-10-CM | POA: Diagnosis not present

## 2022-01-07 DIAGNOSIS — Z79899 Other long term (current) drug therapy: Secondary | ICD-10-CM

## 2022-01-07 DIAGNOSIS — Z7951 Long term (current) use of inhaled steroids: Secondary | ICD-10-CM | POA: Diagnosis not present

## 2022-01-07 DIAGNOSIS — Z886 Allergy status to analgesic agent status: Secondary | ICD-10-CM

## 2022-01-07 DIAGNOSIS — I10 Essential (primary) hypertension: Secondary | ICD-10-CM | POA: Diagnosis not present

## 2022-01-07 DIAGNOSIS — I252 Old myocardial infarction: Secondary | ICD-10-CM

## 2022-01-07 DIAGNOSIS — Z888 Allergy status to other drugs, medicaments and biological substances status: Secondary | ICD-10-CM

## 2022-01-07 DIAGNOSIS — R9431 Abnormal electrocardiogram [ECG] [EKG]: Secondary | ICD-10-CM | POA: Diagnosis not present

## 2022-01-07 DIAGNOSIS — I248 Other forms of acute ischemic heart disease: Secondary | ICD-10-CM | POA: Diagnosis not present

## 2022-01-07 DIAGNOSIS — R54 Age-related physical debility: Secondary | ICD-10-CM | POA: Diagnosis not present

## 2022-01-07 DIAGNOSIS — R0689 Other abnormalities of breathing: Secondary | ICD-10-CM | POA: Diagnosis not present

## 2022-01-07 DIAGNOSIS — F17210 Nicotine dependence, cigarettes, uncomplicated: Secondary | ICD-10-CM | POA: Diagnosis not present

## 2022-01-07 DIAGNOSIS — Z743 Need for continuous supervision: Secondary | ICD-10-CM | POA: Diagnosis not present

## 2022-01-07 DIAGNOSIS — J42 Unspecified chronic bronchitis: Secondary | ICD-10-CM | POA: Diagnosis not present

## 2022-01-07 LAB — COMPREHENSIVE METABOLIC PANEL
ALT: 64 U/L — ABNORMAL HIGH (ref 0–44)
AST: 160 U/L — ABNORMAL HIGH (ref 15–41)
Albumin: 3.6 g/dL (ref 3.5–5.0)
Alkaline Phosphatase: 98 U/L (ref 38–126)
Anion gap: 12 (ref 5–15)
BUN: 15 mg/dL (ref 8–23)
CO2: 24 mmol/L (ref 22–32)
Calcium: 9.3 mg/dL (ref 8.9–10.3)
Chloride: 101 mmol/L (ref 98–111)
Creatinine, Ser: 0.95 mg/dL (ref 0.61–1.24)
GFR, Estimated: >60 mL/min (ref >60)
Glucose, Bld: 124 mg/dL — ABNORMAL HIGH (ref 70–99)
Potassium: 4.2 mmol/L (ref 3.5–5.1)
Sodium: 137 mmol/L (ref 135–145)
Total Bilirubin: 0.5 mg/dL (ref 0.3–1.2)
Total Protein: 7 g/dL (ref 6.5–8.1)

## 2022-01-07 LAB — LACTIC ACID, PLASMA: Lactic Acid, Venous: 3.3 mmol/L (ref 0.5–1.9)

## 2022-01-07 LAB — CBC WITH DIFFERENTIAL/PLATELET
Abs Immature Granulocytes: 0.03 10*3/uL (ref 0.00–0.07)
Basophils Absolute: 0 10*3/uL (ref 0.0–0.1)
Basophils Relative: 0 %
Eosinophils Absolute: 0 10*3/uL (ref 0.0–0.5)
Eosinophils Relative: 0 %
HCT: 37.8 % — ABNORMAL LOW (ref 39.0–52.0)
Hemoglobin: 12 g/dL — ABNORMAL LOW (ref 13.0–17.0)
Immature Granulocytes: 0 %
Lymphocytes Relative: 30 %
Lymphs Abs: 2.1 10*3/uL (ref 0.7–4.0)
MCH: 31.3 pg (ref 26.0–34.0)
MCHC: 31.7 g/dL (ref 30.0–36.0)
MCV: 98.7 fL (ref 80.0–100.0)
Monocytes Absolute: 0.8 10*3/uL (ref 0.1–1.0)
Monocytes Relative: 11 %
Neutro Abs: 4.2 10*3/uL (ref 1.7–7.7)
Neutrophils Relative %: 59 %
Platelets: 121 10*3/uL — ABNORMAL LOW (ref 150–400)
RBC: 3.83 MIL/uL — ABNORMAL LOW (ref 4.22–5.81)
RDW: 13.7 % (ref 11.5–15.5)
WBC: 7.1 10*3/uL (ref 4.0–10.5)
nRBC: 0 % (ref 0.0–0.2)

## 2022-01-07 LAB — TROPONIN I (HIGH SENSITIVITY): Troponin I (High Sensitivity): 21 ng/L — ABNORMAL HIGH (ref <18)

## 2022-01-07 MED ORDER — IPRATROPIUM-ALBUTEROL 0.5-2.5 (3) MG/3ML IN SOLN
3.0000 mL | Freq: Once | RESPIRATORY_TRACT | Status: DC
Start: 2022-01-07 — End: 2022-01-07

## 2022-01-07 MED ORDER — ALBUTEROL SULFATE (2.5 MG/3ML) 0.083% IN NEBU
7.5000 mg/h | INHALATION_SOLUTION | Freq: Once | RESPIRATORY_TRACT | Status: AC
Start: 2022-01-07 — End: 2022-01-07
  Administered 2022-01-07: 7.5 mg/h via RESPIRATORY_TRACT
  Filled 2022-01-07: qty 9

## 2022-01-07 MED ORDER — IPRATROPIUM BROMIDE 0.02 % IN SOLN
0.5000 mg | Freq: Once | RESPIRATORY_TRACT | Status: AC
  Administered 2022-01-07: 0.5 mg via RESPIRATORY_TRACT
  Filled 2022-01-07: qty 2.5

## 2022-01-07 NOTE — H&P (Addendum)
Family Medicine Teaching Service ?Hospital Admission History and Physical ?Service Pager: [phone] ? ?Patient name: Lucas [name] Medical record number: [id number] ?Date of birth: 03/26/1951 Age: 71 y.o. Gender: male ? ?Primary Care Provider: [name], DO ?Consultants: None ?Code Status: Full ?Preferred Emergency Contact: Extended Emergency Contact Information ?Primary Emergency Contact: Knutson,Peggy ?Address: [address]         [place], [place] [address] [place] ?Home Phone: [phone] ?Mobile Phone: [phone] ?Relation: Spouse ? ? ?Chief Complaint: Shortness of breath ? ?Assessment and Plan: ?JAISEN WILTROUT is a 71 y.o. male presenting with shortness of breath and hypoxemia likely due to COPD exacerbation. PMH is significant for COPD, HLD, HTN, a fib, Hx of squamous cell carcinoma, alcohol abuse, tobacco use ? ?Hypoxemia likely secondary to COPD ?Patient was recently hospitalized from 01/03/2022-01/05/2022 for COPD exacerbation. He was discharged on Trelegy Ellipta 1 puff daily, albuterol 2 puffs every 4 hours as needed, DuoNebs every 6 hours as needed, O2 3 L [place], prednisone 40 mg daily for total of 5 days. Patient states he completed prednisone course this morning and states he had been using his breathing treatments as instructed.  Patient was found by EMS saturating 70% on home oxygen of 3 L [place] and received 2 DuoNeb's, 125 methylprednisolone, 2 g magnesium prior to arrival and arrived on CPAP with oxygen saturation of 100%.  On arrival he was tachycardic into the low 100s with blood pressure elevated to 163/97. In ED patient was placed on BiPAP and treated with continuous albuterol 7.5 mg grams per hour for 25 minutes and pressure p.m nebulizer 0.5 mg once. ? On admission labs show hemoglobin 12, WBC 7.1, platelets 121, AST/ALT 160/64, lactic acid 3.3, troponin of 21, Cr 0.95.  CXR is stable with chronic changes in right lung and mild atelectasis at the left lung base and no acute  process noted.  This is unlikely due to infectious etiology as patient is afebrile and CXR shows no evidence of pneumonia.  Can consider PE as cause of shortness of breath as patient is tachycardic.  However tachycardia is likely caused by shortness of breath and albuterol use. Shortness of breath has greatly improved with BiPAP and breathing treatments leading me to think this is likely due to COPD exacerbation.  Patient does not admit to increased sputum production and therefore will not start antibiotics at this time.  Patient completed his 5-day course of prednisone this morning and received Solu-Medrol 125 mg before arriving to the hospital.  We will decide tomorrow if further steroids are needed. ?-Admit to progressive, attending Dr. Ardelia Mems ?-f/u LA ?-f/u VBG ?-f/u trop ?-BiPAP overnight ?-Wean O2 as tolerated to home O2 needs ?-Keep O2 sat >88% ?-Vitals per floor routine ?-Breo Ellipta 1 puff daily ?-Increase Ellipta 1 puff daily ?-DuoNebs every 6 hours  ?-Albuterol neb q2h prn ?-PT/OT eval and treat ? ?A-fib  CAD  history of NSTEMI  s/p CABG x4 (12/16/2019) ?EKG this admission shows sinus tachycardia. Home medications include nitro as needed, metoprolol tartrate 25 mg twice daily, Crestor 40 mg daily.  During last hospitalization patient declined anticoagulation due to risk of bleeding and plan to follow-up at the A-fib clinic. ?-Continuous cardiac monitoring ?-Continue home metoprolol and Crestor when off BiPAP ? ?History of squamous cell carcinoma, RUL, s/p SBRT ?Patient follows with radiology oncology outpatient.  Had CT PE on 01/03/2022 during last admission which ruled out PE but showed spiculated right upper lobe nodular opacity which  appeared more confluent than on last study With close attention on follow-up recommended ?-Follow-up with radiology oncology after discharge ? ?Alcohol abuse  liver cirrhosis  transaminitis  ?Patient reports drinking a sixpack daily.  Had 3-4 beers earlier today.   Did not require Ativan during last hospitalization but has required Ativan in the past for alcohol withdrawal symptoms. On admission patient has transaminitis AST/ALT of 160/64. ?-CIWA protocol initiated ?-Thiamine ?-Folic acid ?-A.m. CMP ? ?HTN ?Blood pressure initially elevated at 163/97, now patient is normotensive.  Home medication includes metoprolol tartrate 25 mg twice daily ?- metoprolol tartrate 25 mg twice daily starting tomorrow if patient is off BiPAP ? ?HLD ?Home medication includes Crestor 40 mg daily.  Last lipid panel done on 01/04/2022 showed total cholesterol 152, LDL of 42 ?-Crestor 40 mg daily starting when off of BiPAP ? ?Anxiety ?Continue home Atarax 10 mg 3 times daily as needed when off BiPAP ? ?Tobacco abuse ?1 pack/day since 71 years old but states he has not smoked since he was recently discharged.  Declined nicotine patch at this time ? ? ? ?FEN/GI: N.p.o. while on BiPAP ?Prophylaxis: Lovenox ? ?Disposition: Progressive ? ?History of Present Illness:  Lucas [name] is a 71 y.o. male presenting with shortness of breath.  Patient was discharged on 3/15 from [facility] where he was hospitalized for COPD exacerbation.  Patient states he suddenly began to feel shortness of breath earlier today and used all of his home inhalers including his DuoNeb and albuterol and had been taking his Trelegy Ellipta daily.  He also completed his course of prednisone this morning as instructed. He had no relief and called EMS.  He has been coughing today and admits to some lightheadedness while coughing and denies any sputum production with his cough.  He admits to chest tightness today. ? ?Last drink was earlier today, 3-4 beers ?No cigarettes since he returned home from the hospital on 3/15 ?No recreational drug use ? ?Review Of Systems: Per HPI with the following additions:  ? ?Review of Systems  ?Constitutional:  Negative for chills and fever.  ?HENT:  Negative for congestion.   ?Eyes:  Negative for visual  disturbance.  ?Respiratory:  Positive for cough, chest tightness and shortness of breath.   ?Cardiovascular:  Negative for leg swelling.  ?Gastrointestinal:  Negative for constipation, diarrhea, nausea and vomiting.  ?Genitourinary:  Positive for difficulty urinating.  ?Neurological:  Positive for light-headedness. Negative for headaches.   ? ?Patient Active Problem List  ? Diagnosis Date Noted  ? Malnutrition of moderate degree 01/04/2022  ? Atrial fibrillation with controlled ventricular response ([place])   ? Anxiety 12/31/2020  ? Neck pain 12/31/2020  ? COPD (chronic obstructive pulmonary disease) ([place]) 09/28/2020  ? Protein-calorie malnutrition, severe 09/09/2020  ? COPD with acute exacerbation ([place]) 09/07/2020  ? Malignant neoplasm of right upper lobe of lung ([place]) 05/26/2020  ? Lung mass 05/04/2020  ? Acute and chronic respiratory failure with hypoxia ([place]) 05/02/2020  ? Pulmonary nodule 02/10/2020  ? Pleural effusion, right   ? S/P CABG x 4 12/16/2019  ? Coronary artery disease 12/16/2019  ? Unstable angina ([place]) 12/12/2019  ? NSTEMI (non-ST elevated myocardial infarction) ([place]) 12/12/2019  ? CAD (coronary artery disease) 07/11/2018  ? H. pylori infection 07/03/2018  ? Chronic stable angina ([place]) 07/03/2018  ? Duodenal ulcer due to Helicobacter pylori   ? Bilateral AVN of femurs ([place]) 06/25/2018  ? Chronic cough 06/25/2018  ? Mallory-Weiss tear   ? Gastritis and  gastroduodenitis   ? Cirrhosis of liver ([place])   ? Tubular adenoma of colon 01/24/2017  ? Epistaxis 01/01/2015  ? COPD exacerbation ([place]) 02/13/2013  ? Ganglion cyst 01/03/2012  ? PERIPHERAL NEUROPATHY 07/15/2010  ? Alcohol use disorder, severe, dependence ([place]) 05/14/2009  ? DEFORMITY, FOOT NEC, CONGENITAL 08/22/2007  ? ALLERGIC RHINITIS, SEASONAL 05/03/2007  ? Hyperlipidemia 12/21/2006  ? Tobacco abuse 12/21/2006  ? COPD with chronic bronchitis ([place]) 12/21/2006  ? ? ?Past Medical History: ?Past Medical History:  ?Diagnosis Date  ? Acute blood loss  anemia 06/21/2018  ? Acute upper GI bleed 06/21/2018  ? Allergy   ? Bilateral AVN of femurs ([place]) 06/25/2018  ? See 11/2017 Chest CT report  ? COPD 12/21/2006  ? Environmental allergies   ? History of femur fracture 9

## 2022-01-07 NOTE — ED Provider Notes (Signed)
[facility] EMERGENCY DEPARTMENT Provider Note  CSN: [id number] Arrival date & time: 01/07/22 2134  Chief Complaint(s) Respiratory Distress  HPI Lucas [name] is a 71 y.o. male with PMH COPD, paroxysmal A-fib with intermittent RVR, CAD, alcohol abuse, liver cirrhosis, HTN, HLD who presents emergency department for evaluation of shortness of breath.  Patient found by EMS to be acutely diaphoretic with increased accessory muscle use, saturating 70% home with 3 L nasal cannula.  Patient received 2 DuoNebs, 125 methylprednisolone, 2 g of magnesium prior to arrival.  Patient arrives on CPAP and EMS states that his respiratory status has improved dramatically since starting this.  Patient arrives in respiratory distress but saturating 100% on CPAP.  Core temp 96.6.  HPI  Past Medical History Past Medical History:  Diagnosis Date   Acute blood loss anemia 06/21/2018   Acute upper GI bleed 06/21/2018   Allergy    Bilateral AVN of femurs (HCC) 06/25/2018   See 11/2017 Chest CT report   COPD 12/21/2006   Environmental allergies    History of femur fracture 06/25/2018   11/2017 CT AP showed Remote nonunited right femur greater trochanter fracture   History of Fracture of right tibia 06/26/2018   History of multiple right thoacic rib fractures 06/25/2018   History of Right clavicular fracture 06/25/2018   HYPERLIPIDEMIA 12/21/2006   HYPERTENSION, BENIGN SYSTEMIC 12/21/2006   Mallory-Weiss tear    TOBACCO DEPENDENCE 12/21/2006   Patient Active Problem List   Diagnosis Date Noted   Malnutrition of moderate degree 01/04/2022   Atrial fibrillation with controlled ventricular response (HCC)    Anxiety 12/31/2020   Neck pain 12/31/2020   COPD (chronic obstructive pulmonary disease) (HCC) 09/28/2020   Protein-calorie malnutrition, severe 09/09/2020   COPD with acute exacerbation (HCC) 09/07/2020   Malignant neoplasm of right upper lobe of lung (HCC) 05/26/2020   Lung mass 05/04/2020   Acute  and chronic respiratory failure with hypoxia (HCC) 05/02/2020   Pulmonary nodule 02/10/2020   Pleural effusion, right    S/P CABG x 4 12/16/2019   Coronary artery disease 12/16/2019   Unstable angina (HCC) 12/12/2019   NSTEMI (non-ST elevated myocardial infarction) (HCC) 12/12/2019   CAD (coronary artery disease) 07/11/2018   H. pylori infection 07/03/2018   Chronic stable angina (HCC) 07/03/2018   Duodenal ulcer due to Helicobacter pylori    Bilateral AVN of femurs (HCC) 06/25/2018   Chronic cough 06/25/2018   Mallory-Weiss tear    Gastritis and gastroduodenitis    Cirrhosis of liver (HCC)    Tubular adenoma of colon 01/24/2017   Epistaxis 01/01/2015   COPD exacerbation (HCC) 02/13/2013   Ganglion cyst 01/03/2012   PERIPHERAL NEUROPATHY 07/15/2010   Alcohol use disorder, severe, dependence (HCC) 05/14/2009   DEFORMITY, FOOT NEC, CONGENITAL 08/22/2007   ALLERGIC RHINITIS, SEASONAL 05/03/2007   Hyperlipidemia 12/21/2006   Tobacco abuse 12/21/2006   COPD with chronic bronchitis (HCC) 12/21/2006   Home Medication(s) Prior to Admission medications   Medication Sig Start Date End Date Taking? Authorizing Provider  albuterol (VENTOLIN HFA) 108 (90 Base) MCG/ACT inhaler INHALE 2 PUFFS BY MOUTH EVERY 4 HOURS FOR WHEEZE AND SHORTNESS OF BREATH Patient taking differently: Inhale 2 puffs into the lungs every 4 (four) hours as needed for wheezing or shortness of breath. 12/10/21   Katha Cabal, DO  diclofenac Sodium (VOLTAREN) 1 % GEL Apply 2 g topically 4 (four) times daily. Patient taking differently: Apply 2 g topically 4 (four) times daily as needed (pain). 12/28/20  Katha Cabal, DO  Fluticasone-Umeclidin-Vilant (TRELEGY ELLIPTA) 100-62.5-25 MCG/ACT AEPB Inhale 1 puff into the lungs daily. 01/05/22   Brimage, Seward Meth, DO  hydrOXYzine (ATARAX) 10 MG tablet TAKE 1 TABLET(10 MG) BY MOUTH EVERY 8 HOURS AS NEEDED FOR ANXIETY Patient taking differently: Take 10 mg by mouth every 8 (eight)  hours as needed for anxiety. 11/04/21   Katha Cabal, DO  ibuprofen (ADVIL) 200 MG tablet Take 200 mg by mouth every 6 (six) hours as needed for mild pain or headache.    [provider]  ipratropium-albuterol (DUONEB) 0.5-2.5 (3) MG/3ML SOLN Take 3 mLs by nebulization every 6 (six) hours as needed. Patient taking differently: Take 3 mLs by nebulization every 6 (six) hours as needed (for wheezing and shortness of breath). 09/10/20   Ghimire, Werner Lean, MD  metoprolol tartrate (LOPRESSOR) 25 MG tablet TAKE 1 TABLET(25 MG) BY MOUTH TWICE DAILY Patient taking differently: Take 25 mg by mouth 2 (two) times daily. 08/24/21   Brimage, Seward Meth, DO  nitroGLYCERIN (NITROSTAT) 0.4 MG SL tablet Place 1 tablet (0.4 mg total) under the tongue every 5 (five) minutes as needed for chest pain. 12/10/21   Brimage, Seward Meth, DO  OXYGEN Inhale 3 L into the lungs See admin instructions. Continuous at night, as needed during the day    [provider]  oxymetazoline (AFRIN) 0.05 % nasal spray Place 1 spray into both nostrils 2 (two) times daily as needed for congestion (nose bleeds).    [provider]  predniSONE (DELTASONE) 20 MG tablet Take 2 tablets (40 mg total) by mouth daily with breakfast for 2 doses. 01/05/22 01/07/22  Katha Cabal, DO  rosuvastatin (CRESTOR) 40 MG tablet Take 40 mg by mouth daily. 11/02/21   [provider]  loratadine (CLARITIN) 10 MG tablet Take 1 tablet (10 mg total) by mouth daily. 03/04/11 05/23/18  Bobby Rumpf, MD                                                                                                                                    Past Surgical History Past Surgical History:  Procedure Laterality Date   ANKLE SURGERY     left ankle   APPLICATION OF WOUND VAC  01/06/2012   Procedure: APPLICATION OF WOUND VAC;  Surgeon: Cammy Copa, MD;  Location: [facility];  Service: Orthopedics;  Laterality: Left;   APPLICATION OF WOUND VAC Right  10/17/2015   Procedure: APPLICATION OF WOUND VAC;  Surgeon: Tarry Kos, MD;  Location: [facility];  Service: Orthopedics;  Laterality: Right;   BIOPSY  06/22/2018   Procedure: BIOPSY;  Surgeon: Lillia Mountain v, DO;  Location: [facility];  Service: Gastroenterology;;   BRONCHIAL BIOPSY  05/04/2020   Procedure: BRONCHIAL BIOPSIES;  Surgeon: Josephine Igo, DO;  Location: [facility];  Service: Pulmonary;;   BRONCHIAL BRUSHINGS  05/04/2020   Procedure: BRONCHIAL BRUSHINGS;  Surgeon: Josephine Igo, DO;  [facility]: [facility];  Service: Pulmonary;;   BRONCHIAL NEEDLE ASPIRATION BIOPSY  05/04/2020   Procedure: BRONCHIAL NEEDLE ASPIRATION BIOPSIES;  Surgeon: Josephine Igo, DO;  [facility]: [facility];  Service: Pulmonary;;   BRONCHIAL WASHINGS  05/04/2020   Procedure: BRONCHIAL WASHINGS;  Surgeon: Josephine Igo, DO;  [facility]: [facility];  Service: Pulmonary;;   CORONARY ARTERY BYPASS GRAFT N/A 12/16/2019   Procedure: CORONARY ARTERY BYPASS GRAFTING (CABG) TIMES 4 USING LEFT INTERNAL MAMMARY ARTERY AND ENDOSCOPICALLY HARVESTED RIGHT SAPHENOUS VEIN;  Surgeon: Corliss Skains, MD;  Location: [facility];  Service: Open Heart Surgery;  Laterality: N/A;   ESOPHAGOGASTRODUODENOSCOPY (EGD) WITH PROPOFOL N/A 06/22/2018   Procedure: ESOPHAGOGASTRODUODENOSCOPY (EGD) WITH PROPOFOL;  Surgeon: [id number] v, DO;  [facility]: [facility];  Service: Gastroenterology;  Laterality: N/A;   EXTERNAL FIXATION LEG  01/06/2012   Procedure: EXTERNAL FIXATION LEG;  Surgeon: Cammy Copa, MD;  Location: [facility];  Service: Orthopedics;  Laterality: Left;  Smith-nephew external fixator set   EXTERNAL FIXATION LEG Right 09/16/2015   Procedure: EXTERNAL FIXATION LEG/LARGE;  Surgeon: Kathryne Hitch, MD;  Location: [facility] OR;  Service: Orthopedics;  Laterality: Right;   EXTERNAL FIXATION REMOVAL Right 09/22/2015   Procedure: REMOVAL EXTERNAL FIXATION LEG;  Surgeon: Kathryne Hitch, MD;  Location: [facility] OR;   Service: Orthopedics;  Laterality: Right;   FASCIOTOMY  01/10/2012   Procedure: FASCIOTOMY;  Surgeon: Budd Palmer, MD;  Location: [facility] OR;  Service: Orthopedics;  Laterality: Left;  status post fasciotomy with wound vac left calf; adjustment external fixator left tibia under fluoro; retention suture/wound vac placement left lateral calf wound   FASCIOTOMY  01/06/2012   Procedure: FASCIOTOMY;  Surgeon: Cammy Copa, MD;  Location: [facility];  Service: Orthopedics;  Laterality: Left;  Four compartment fasciotomy   FIDUCIAL MARKER PLACEMENT  05/04/2020   Procedure: FIDUCIAL MARKER PLACEMENT;  Surgeon: Josephine Igo, DO;  [facility];  Service: Pulmonary;;   HOT HEMOSTASIS N/A 06/22/2018   Procedure: HEMOSTASIS;  Surgeon: Lillia Mountain v, DO;  [facility];  Service: Gastroenterology;  Laterality: N/A;  Epinepherine and clip placed   I & D EXTREMITY  01/12/2012   Procedure: IRRIGATION AND DEBRIDEMENT EXTREMITY;  Surgeon: Budd Palmer, MD;  [facility]: [facility];  Service: Orthopedics;  Laterality: Left;  LAYERD CLOSURE LEFT LEG WOUND   I & D EXTREMITY Right 10/17/2015   Procedure: IRRIGATION AND DEBRIDEMENT EXTREMITY;  Surgeon: Tarry Kos, MD;  [facility]: [facility];  Service: Orthopedics;  Laterality: Right;   I & D EXTREMITY Right 10/20/2015   Procedure: REPEAT IRRIGATION AND DEBRIDEMENT EXTREMITY, right lower leg;  Surgeon: Kathryne Hitch, MD;  Location: [facility];  Service: Orthopedics;  Laterality: Right;   KNEE SURGERY     left knee and right knee   LEFT HEART CATH AND CORONARY ANGIOGRAPHY N/A 06/27/2018   Procedure: LEFT HEART CATH AND CORONARY ANGIOGRAPHY;  Surgeon: Swaziland, Peter M, MD;  Location: [facility] INVASIVE CV LAB;  Service: Cardiovascular;  Laterality: N/A;   LEFT HEART CATH AND CORONARY ANGIOGRAPHY N/A 12/12/2019   Procedure: LEFT HEART CATH AND CORONARY ANGIOGRAPHY;  Surgeon: Iran Ouch, MD;  [facility];  Service: Cardiovascular;  Laterality:  N/A;   MASS EXCISION Left 09/22/2015   Procedure: EXCISION MASS;  Surgeon: Kathryne Hitch, MD;  Location: [facility] OR;  Service: Orthopedics;  Laterality: Left;   ORIF TIBIA PLATEAU  02/07/2012   Procedure: OPEN REDUCTION INTERNAL FIXATION (ORIF) TIBIAL PLATEAU;  Surgeon: Budd Palmer, MD;  [facility];  Service: Orthopedics;  Laterality: Left;  ORIF LEFT TIBIAL PLATEAU/ REMOVE EXTERNAL FIXATION LEFT LEG   ORIF TIBIA PLATEAU Right 09/22/2015   Procedure: REMOVAL OF EXTERNAL FIXATOR RIGHT LEG, OPEN REDUCTION INTERNAL FIXATION (ORIF) RIGHT TIBIAL PLATEAU, EXCISION SMALL MASS LEFT LEG;  Surgeon: Kathryne Hitch, MD;  Location: [facility];  Service: Orthopedics;  Laterality: Right;   SKIN SPLIT GRAFT Right 10/23/2015   Procedure: RIGHT GASTROC FLAP SPLIT THICKNESS SKIN GRAFT FROM RIGHT THING TO RIGHT LEG;  Surgeon: Glenna Fellows, MD;  Location: [facility];  Service: Plastics;  Laterality: Right;   TEE WITHOUT CARDIOVERSION N/A 12/16/2019   Procedure: TRANSESOPHAGEAL ECHOCARDIOGRAM (TEE);  Surgeon: Corliss Skains, MD;  Location: [facility] OR;  Service: Open Heart Surgery;  Laterality: N/A;   VIDEO BRONCHOSCOPY WITH ENDOBRONCHIAL NAVIGATION Right 05/04/2020   Procedure: VIDEO BRONCHOSCOPY WITH ENDOBRONCHIAL NAVIGATION;  Surgeon: Josephine Igo, DO;  [facility]: [facility];  Service: Pulmonary;  Laterality: Right;   WRIST SURGERY     left   Family History Family History  Problem Relation Age of Onset   Emphysema Father     Social History Social History   Tobacco Use   Smoking status: Every Day    Packs/day: 0.50    Years: 51.00    Pack years: 25.50    Types: Cigarettes   Smokeless tobacco: Former    Types: Snuff, Chew    Quit date: 01/05/1965   Tobacco comments:    currently half a pack a day-05/26/2020  Vaping Use   Vaping Use: Never used  Substance Use Topics   Alcohol use: Yes    Alcohol/week: 6.0 standard drinks    Types: 6 Cans of beer per week    Comment: drinks about a  6-pack/day   Drug use: No   Allergies Ace inhibitors, Ampicillin, Penicillins, Codeine, Lisinopril, and Aspirin  Review of Systems Review of Systems  Constitutional:  Positive for diaphoresis.  Respiratory:  Positive for cough and shortness of breath.    Physical Exam Vital Signs  I have reviewed the triage vital signs BP (!) 163/97   Pulse (!) 102   Temp (!) 96.6 F (35.9 C) (Temporal)   Resp 19   SpO2 100%   Physical Exam Vitals and nursing note reviewed.  Constitutional:      General: He is in acute distress.     Appearance: He is well-developed. He is diaphoretic.  HENT:     Head: Normocephalic and atraumatic.  Eyes:     Conjunctiva/sclera: Conjunctivae normal.  Cardiovascular:     Rate and Rhythm: Regular rhythm. Tachycardia present.     Heart sounds: No murmur heard. Pulmonary:     Effort: Respiratory distress present.     Breath sounds: Wheezing present.  Abdominal:     Palpations: Abdomen is soft.     Tenderness: There is no abdominal tenderness.  Musculoskeletal:        General: No swelling.     Cervical back: Neck supple.  Skin:    General: Skin is warm.     Capillary Refill: Capillary refill takes less than 2 seconds.  Neurological:     Mental Status: He is alert.  Psychiatric:        Mood and Affect: Mood normal.    ED Results and Treatments Labs (all labs ordered are listed, but only abnormal results are displayed) Labs Reviewed  CBC WITH DIFFERENTIAL/PLATELET  COMPREHENSIVE METABOLIC PANEL  BLOOD GAS, VENOUS  TROPONIN I (HIGH SENSITIVITY)  Radiology No results found.  Pertinent labs & imaging results that were available during my care of the patient were reviewed by me and considered in my medical decision making (see MDM for details).  Medications Ordered in ED Medications  albuterol (PROVENTIL) (2.5 MG/3ML) 0.083%  nebulizer solution (has no administration in time range)  ipratropium (ATROVENT) nebulizer solution 0.5 mg (has no administration in time range)                                                                                                                                     Procedures .Critical Care Performed by: Glendora Score, MD Authorized by: Glendora Score, MD   Critical care provider statement:    Critical care time (minutes):  30   Critical care was necessary to treat or prevent imminent or life-threatening deterioration of the following conditions:  Respiratory failure   Critical care was time spent personally by me on the following activities:  Development of treatment plan with patient or surrogate, discussions with consultants, evaluation of patient's response to treatment, examination of patient, ordering and review of laboratory studies, ordering and review of radiographic studies, ordering and performing treatments and interventions, pulse oximetry, re-evaluation of patient's condition and review of old charts  (including critical care time)  Medical Decision Making / ED Course   This patient presents to the ED for concern of shortness of breath, this involves an extensive number of treatment options, and is a complaint that carries with it a high risk of complications and morbidity.  The differential diagnosis includes COPD exacerbation, CHF exacerbation, pneumonia  MDM: Patient seen emergency department for evaluation of shortness of breath in setting of a COPD exacerbation.  Physical exam reveals an ill-appearing cachectic patient on BiPAP with expiratory wheezing.  Patient received majority of his required care by EMS but he was given an additional continuous albuterol treatment with his last remaining Atrovent here in the emergency department.  Laboratory evaluation with a hemoglobin of 12.0, high-sensitivity troponin elevated 21 likely demand ischemia, AST 160, ALT 64  which is likely elevated in the setting of his alcohol use.  Chest x-ray with no pneumonia or pneumothorax.  Lactate elevated at 3.3 likely elevated in the setting of his previous hypoxia.  Patient will require admission for persistent shortness of breath on BiPAP in the setting of a COPD exacerbation patient admitted back to the family medicine service   Additional history obtained:  -External records from outside source obtained and reviewed including: Chart review including previous notes, labs, imaging, consultation notes   Lab Tests: -I ordered, reviewed, and interpreted labs.   The pertinent results include:   Labs Reviewed  CBC WITH DIFFERENTIAL/PLATELET  COMPREHENSIVE METABOLIC PANEL  BLOOD GAS, VENOUS  TROPONIN I (HIGH SENSITIVITY)      EKG   EKG Interpretation  Date/Time:  Friday January 07 2022 21:45:59 EDT Ventricular Rate:  101 PR Interval:  196  QRS Duration: 90 QT Interval:  341 QTC Calculation: 442 R Axis:   81 Text Interpretation: Sinus tachycardia Right atrial enlargement Consider left ventricular hypertrophy Confirmed by Briannon Boggio ([id number]) on 01/07/2022 9:51:11 PM         Imaging Studies ordered: I ordered imaging studies including CXR I independently visualized and interpreted imaging. I agree with the radiologist interpretation   Medicines ordered and prescription drug management: Meds ordered this encounter  Medications   DISCONTD: ipratropium-albuterol (DUONEB) 0.5-2.5 (3) MG/3ML nebulizer solution 3 mL   albuterol (PROVENTIL) (2.5 MG/3ML) 0.083% nebulizer solution   ipratropium (ATROVENT) nebulizer solution 0.5 mg    -I have reviewed the patients home medicines and have made adjustments as needed  Critical interventions BiPAP, duonebs   Cardiac Monitoring: The patient was maintained on a cardiac monitor.  I personally viewed and interpreted the cardiac monitored which showed an underlying rhythm of: Sinus tachycardia   Social  Determinants of Health:  Factors impacting patients care include: none   Reevaluation: After the interventions noted above, I reevaluated the patient and found that they have :improved  Co morbidities that complicate the patient evaluation  Past Medical History:  Diagnosis Date   Acute blood loss anemia 06/21/2018   Acute upper GI bleed 06/21/2018   Allergy    Bilateral AVN of femurs (HCC) 06/25/2018   See 11/2017 Chest CT report   COPD 12/21/2006   Environmental allergies    History of femur fracture 06/25/2018   11/2017 CT AP showed Remote nonunited right femur greater trochanter fracture   History of Fracture of right tibia 06/26/2018   History of multiple right thoacic rib fractures 06/25/2018   History of Right clavicular fracture 06/25/2018   HYPERLIPIDEMIA 12/21/2006   HYPERTENSION, BENIGN SYSTEMIC 12/21/2006   Mallory-Weiss tear    TOBACCO DEPENDENCE 12/21/2006      Dispostion: I considered admission for this patient, and due to persistent respiratory failure and need for BiPAP patient was admitted     Final Clinical Impression(s) / ED Diagnoses Final diagnoses:  None     @PCDICTATION @    Glendora Score, MD 01/08/22 [phone]

## 2022-01-07 NOTE — ED Triage Notes (Signed)
Patient with increasing shortness of breath during the day today.  Used at home inhalers and nebs without relief.  Patient wears 3L via [place] at home.  EMS states that he has had silent chest upon their arrival.  Patient with history of Lung CA and COPD.  Patient was found at 73% on his oxygen at home.  No chest pain.  Patient was unable to speak upon EMS arrival.   ?

## 2022-01-08 ENCOUNTER — Other Ambulatory Visit: Payer: Self-pay

## 2022-01-08 ENCOUNTER — Encounter (HOSPITAL_COMMUNITY): Payer: Self-pay | Admitting: Family Medicine

## 2022-01-08 DIAGNOSIS — Z9981 Dependence on supplemental oxygen: Secondary | ICD-10-CM | POA: Diagnosis not present

## 2022-01-08 DIAGNOSIS — Z951 Presence of aortocoronary bypass graft: Secondary | ICD-10-CM | POA: Diagnosis not present

## 2022-01-08 DIAGNOSIS — Z7951 Long term (current) use of inhaled steroids: Secondary | ICD-10-CM | POA: Diagnosis not present

## 2022-01-08 DIAGNOSIS — R54 Age-related physical debility: Secondary | ICD-10-CM | POA: Diagnosis present

## 2022-01-08 DIAGNOSIS — Z681 Body mass index (BMI) 19 or less, adult: Secondary | ICD-10-CM | POA: Diagnosis not present

## 2022-01-08 DIAGNOSIS — J441 Chronic obstructive pulmonary disease with (acute) exacerbation: Secondary | ICD-10-CM | POA: Diagnosis present

## 2022-01-08 DIAGNOSIS — I2510 Atherosclerotic heart disease of native coronary artery without angina pectoris: Secondary | ICD-10-CM | POA: Diagnosis present

## 2022-01-08 DIAGNOSIS — G629 Polyneuropathy, unspecified: Secondary | ICD-10-CM | POA: Diagnosis present

## 2022-01-08 DIAGNOSIS — J9621 Acute and chronic respiratory failure with hypoxia: Secondary | ICD-10-CM | POA: Diagnosis present

## 2022-01-08 DIAGNOSIS — J42 Unspecified chronic bronchitis: Secondary | ICD-10-CM | POA: Diagnosis not present

## 2022-01-08 DIAGNOSIS — I480 Paroxysmal atrial fibrillation: Secondary | ICD-10-CM | POA: Diagnosis present

## 2022-01-08 DIAGNOSIS — F1010 Alcohol abuse, uncomplicated: Secondary | ICD-10-CM | POA: Diagnosis present

## 2022-01-08 DIAGNOSIS — R0602 Shortness of breath: Secondary | ICD-10-CM | POA: Diagnosis present

## 2022-01-08 DIAGNOSIS — R64 Cachexia: Secondary | ICD-10-CM | POA: Diagnosis present

## 2022-01-08 DIAGNOSIS — I252 Old myocardial infarction: Secondary | ICD-10-CM | POA: Diagnosis not present

## 2022-01-08 DIAGNOSIS — E440 Moderate protein-calorie malnutrition: Secondary | ICD-10-CM | POA: Diagnosis present

## 2022-01-08 DIAGNOSIS — F17210 Nicotine dependence, cigarettes, uncomplicated: Secondary | ICD-10-CM | POA: Diagnosis present

## 2022-01-08 DIAGNOSIS — K7460 Unspecified cirrhosis of liver: Secondary | ICD-10-CM | POA: Diagnosis present

## 2022-01-08 DIAGNOSIS — F419 Anxiety disorder, unspecified: Secondary | ICD-10-CM | POA: Diagnosis present

## 2022-01-08 DIAGNOSIS — Z886 Allergy status to analgesic agent status: Secondary | ICD-10-CM | POA: Diagnosis not present

## 2022-01-08 DIAGNOSIS — E785 Hyperlipidemia, unspecified: Secondary | ICD-10-CM | POA: Diagnosis present

## 2022-01-08 DIAGNOSIS — Z79899 Other long term (current) drug therapy: Secondary | ICD-10-CM | POA: Diagnosis not present

## 2022-01-08 DIAGNOSIS — I248 Other forms of acute ischemic heart disease: Secondary | ICD-10-CM | POA: Diagnosis present

## 2022-01-08 DIAGNOSIS — J9622 Acute and chronic respiratory failure with hypercapnia: Secondary | ICD-10-CM | POA: Diagnosis present

## 2022-01-08 DIAGNOSIS — J449 Chronic obstructive pulmonary disease, unspecified: Secondary | ICD-10-CM | POA: Diagnosis not present

## 2022-01-08 DIAGNOSIS — I10 Essential (primary) hypertension: Secondary | ICD-10-CM | POA: Diagnosis present

## 2022-01-08 DIAGNOSIS — Z825 Family history of asthma and other chronic lower respiratory diseases: Secondary | ICD-10-CM | POA: Diagnosis not present

## 2022-01-08 LAB — BLOOD GAS, VENOUS
Acid-Base Excess: 4.5 mmol/L — ABNORMAL HIGH (ref 0.0–2.0)
Bicarbonate: 29.8 mmol/L — ABNORMAL HIGH (ref 20.0–28.0)
Drawn by: 6344
O2 Saturation: 98.3 %
Patient temperature: 36.9
pCO2, Ven: 46 mmHg (ref 44–60)
pH, Ven: 7.42 (ref 7.25–7.43)
pO2, Ven: 137 mmHg — ABNORMAL HIGH (ref 32–45)

## 2022-01-08 LAB — CBC
HCT: 33.5 % — ABNORMAL LOW (ref 39.0–52.0)
Hemoglobin: 11.2 g/dL — ABNORMAL LOW (ref 13.0–17.0)
MCH: 31.6 pg (ref 26.0–34.0)
MCHC: 33.4 g/dL (ref 30.0–36.0)
MCV: 94.6 fL (ref 80.0–100.0)
Platelets: 125 10*3/uL — ABNORMAL LOW (ref 150–400)
RBC: 3.54 MIL/uL — ABNORMAL LOW (ref 4.22–5.81)
RDW: 13.8 % (ref 11.5–15.5)
WBC: 4.8 10*3/uL (ref 4.0–10.5)
nRBC: 0 % (ref 0.0–0.2)

## 2022-01-08 LAB — COMPREHENSIVE METABOLIC PANEL
ALT: 63 U/L — ABNORMAL HIGH (ref 0–44)
AST: 94 U/L — ABNORMAL HIGH (ref 15–41)
Albumin: 3.2 g/dL — ABNORMAL LOW (ref 3.5–5.0)
Alkaline Phosphatase: 85 U/L (ref 38–126)
Anion gap: 7 (ref 5–15)
BUN: 15 mg/dL (ref 8–23)
CO2: 27 mmol/L (ref 22–32)
Calcium: 9 mg/dL (ref 8.9–10.3)
Chloride: 102 mmol/L (ref 98–111)
Creatinine, Ser: 0.72 mg/dL (ref 0.61–1.24)
GFR, Estimated: >60 mL/min (ref >60)
Glucose, Bld: 140 mg/dL — ABNORMAL HIGH (ref 70–99)
Potassium: 4.5 mmol/L (ref 3.5–5.1)
Sodium: 136 mmol/L (ref 135–145)
Total Bilirubin: 0.4 mg/dL (ref 0.3–1.2)
Total Protein: 6.5 g/dL (ref 6.5–8.1)

## 2022-01-08 LAB — LACTIC ACID, PLASMA: Lactic Acid, Venous: 1 mmol/L (ref 0.5–1.9)

## 2022-01-08 LAB — TROPONIN I (HIGH SENSITIVITY)
Troponin I (High Sensitivity): 70 ng/L — ABNORMAL HIGH (ref <18)
Troponin I (High Sensitivity): 61 ng/L — ABNORMAL HIGH (ref <18)

## 2022-01-08 MED ORDER — IPRATROPIUM-ALBUTEROL 0.5-2.5 (3) MG/3ML IN SOLN
3.0000 mL | Freq: Three times a day (TID) | RESPIRATORY_TRACT | Status: DC
  Administered 2022-01-08 – 2022-01-09 (×2): 3 mL via RESPIRATORY_TRACT
  Filled 2022-01-08 (×2): qty 3

## 2022-01-08 MED ORDER — ROSUVASTATIN CALCIUM 20 MG PO TABS
40.0000 mg | ORAL_TABLET | Freq: Every day | ORAL | Status: DC
  Administered 2022-01-08 – 2022-01-11 (×4): 40 mg via ORAL
  Filled 2022-01-08 (×4): qty 2

## 2022-01-08 MED ORDER — ALBUTEROL SULFATE (2.5 MG/3ML) 0.083% IN NEBU: 2.5000 mg | INHALATION_SOLUTION | RESPIRATORY_TRACT | Status: DC | PRN

## 2022-01-08 MED ORDER — ADULT MULTIVITAMIN W/MINERALS CH
1.0000 | ORAL_TABLET | Freq: Every day | ORAL | Status: DC
  Administered 2022-01-08 – 2022-01-11 (×4): 1 via ORAL
  Filled 2022-01-08 (×4): qty 1

## 2022-01-08 MED ORDER — FOLIC ACID 1 MG PO TABS
1.0000 mg | ORAL_TABLET | Freq: Every day | ORAL | Status: DC
  Administered 2022-01-08 – 2022-01-11 (×4): 1 mg via ORAL
  Filled 2022-01-08 (×4): qty 1

## 2022-01-08 MED ORDER — METOPROLOL TARTRATE 25 MG PO TABS
25.0000 mg | ORAL_TABLET | Freq: Two times a day (BID) | ORAL | Status: DC
  Administered 2022-01-08 – 2022-01-11 (×7): 25 mg via ORAL
  Filled 2022-01-08 (×7): qty 1

## 2022-01-08 MED ORDER — UMECLIDINIUM BROMIDE 62.5 MCG/ACT IN AEPB
1.0000 | INHALATION_SPRAY | Freq: Every day | RESPIRATORY_TRACT | Status: DC
  Administered 2022-01-09 – 2022-01-10 (×2): 1 via RESPIRATORY_TRACT
  Filled 2022-01-08: qty 7

## 2022-01-08 MED ORDER — THIAMINE HCL 100 MG PO TABS
100.0000 mg | ORAL_TABLET | Freq: Every day | ORAL | Status: DC
  Administered 2022-01-08 – 2022-01-11 (×4): 100 mg via ORAL
  Filled 2022-01-08 (×4): qty 1

## 2022-01-08 MED ORDER — ENOXAPARIN SODIUM 40 MG/0.4ML IJ SOSY
40.0000 mg | PREFILLED_SYRINGE | INTRAMUSCULAR | Status: DC
  Filled 2022-01-08 (×3): qty 0.4

## 2022-01-08 MED ORDER — HYDROXYZINE HCL 10 MG PO TABS
10.0000 mg | ORAL_TABLET | Freq: Three times a day (TID) | ORAL | Status: DC | PRN
  Administered 2022-01-09: 10 mg via ORAL
  Filled 2022-01-08: qty 1

## 2022-01-08 MED ORDER — IPRATROPIUM-ALBUTEROL 0.5-2.5 (3) MG/3ML IN SOLN
3.0000 mL | Freq: Four times a day (QID) | RESPIRATORY_TRACT | Status: DC
  Administered 2022-01-08 (×3): 3 mL via RESPIRATORY_TRACT
  Filled 2022-01-08 (×3): qty 3

## 2022-01-08 MED ORDER — PREDNISONE 50 MG PO TABS
50.0000 mg | ORAL_TABLET | Freq: Every day | ORAL | Status: DC
  Administered 2022-01-08 – 2022-01-11 (×4): 50 mg via ORAL
  Filled 2022-01-08 (×4): qty 1

## 2022-01-08 MED ORDER — FLUTICASONE FUROATE-VILANTEROL 100-25 MCG/ACT IN AEPB
1.0000 | INHALATION_SPRAY | Freq: Every day | RESPIRATORY_TRACT | Status: DC
  Administered 2022-01-09 – 2022-01-10 (×2): 1 via RESPIRATORY_TRACT
  Filled 2022-01-08: qty 28

## 2022-01-08 NOTE — Progress Notes (Addendum)
Family Medicine Teaching Service ?Daily Progress Note ?Intern Pager: [phone] ? ?Patient name: Lucas [name] Medical record number: [id number] ?Date of birth: Nov 01, 1950 Age: 71 y.o. Gender: male ? ?Primary Care Provider: [name], DO ?Consultants: None ?Code Status: Full ? ?Pt Overview and Major Events to Date:  ?3/17 Admitted  ? ?Assessment and Plan: ?Lucas [name] is a 71 y.o. male presenting with shortness of breath and hypoxemia likely due to COPD exacerbation. PMH is significant for COPD, HLD, HTN, a fib, Hx of squamous cell carcinoma, alcohol abuse, tobacco use. ? ?Hypoxemia likely 2/2 COPD  ?Recently hospitalized from 3/13-3/15 for COPD exacerbation. Completed 5 day prednisone course yesterday. Presented with increased SOB yesterday, Patient was found by EMS saturating 70% on home oxygen of 3 L [place] and received 2 DuoNeb's, 125 methylprednisolone, 2 g magnesium. Was placed on BiPAP in ED and was currently on it when I examined him this am. He appeared well and breathing comfortably, so removed BiPAP and will see how he does. CXR with chronic changes in R lung. LA 1.0. Trop 21>70>61 likely 2/2 demand ischemia. Remains afebrile, normal white count, no mucous production.  ?Home regimen: Trelegy Ellipta 1 puff daily, albuterol 2 puffs every 4 hours as needed, DuoNebs every 6 hours as needed ?- Restart steroids- prednisone 50mg   ?- Keep O2 sat greater than 88% ?- Continue Breo Ellipta and Incruse Ellipta 1 puff daily ?- Continue DuoNeb every 6 hours ?- Albuterol every 2 hours as needed ?- DuoNebs ?- PT/OT eval ? ?A-fib  CAD  history of NSTEMI  s/p CABG x4 (12/16/2019) ?Patient declined anticoagulation due to risk of bleeding, and was advised to follow-up at A-fib clinic. HR currently 95 bpm  ?Home medication: Crestor 40 mg daily, metoprolol tartrate 25 mg twice daily ?- Continue cardiac monitoring  ?- Continue Crestor 40mg  daily ?- Continue metoprolol tartrate 25 mg twice daily ? ?Alcohol abuse  liver  cirrhosis  transaminitis  ?Drinks a sixpack daily.  Last drink yesterday. AST 94 down from 160 yesterday, ALT 63 from 64 yesterday.  ?- Monitor CIWA ?- Thiamine and folic acid ? ?Hypertension ?BP normotensive at 123/73 this morning ?- Continue metoprolol tartrate 25 mg twice daily ? ?HLD ?- Continue Crestor 40 mg daily ? ?Anxiety ?Continue Atarax 10 mg 3 times daily as needed ? ?History of squamous cell carcinoma, RUL, s/p SBRT ?Follows with radiation oncology outpatient. ?- Follow-up with radiation oncology after discharge ? ? ?FEN/GI: Heart healthy diet  ?PPx: Lovenox ? ?Disposition: Progressive ? ?Subjective:  ?Patient feeling well this morning. Denies any difficulty breathing and denies chest pain. States he is hungry and would like to be able to eat. Denied any other questions or concerns.  ? ?Objective: ?Temp:  [96.6 ?F (35.9 ?C)-98.6 ?F (37 ?C)] 98.6 ?F (37 ?C) (03/18 0400) ?Pulse Rate:  [71-110] 74 (03/18 0400) ?Resp:  [13-24] 13 (03/18 0400) ?BP: (110-163)/(67-97) 125/75 (03/18 0400) ?SpO2:  [97 %-100 %] 100 % (03/18 0400) ?FiO2 (%):  [40 %-45 %] 45 % (03/18 0156) ?Weight:  [64.6 kg] 64.6 kg (03/18 0400) ?Physical Exam: ?General: alert, sitting up in bed with BiPAP in place, NAD  ?Cardiovascular: RRR no murmurs  ?Respiratory: CTAB on BiPAP, normal WOB ?Abdomen: soft, non distended, non tender  ?Extremities: warm, dry. No LE edema  ? ?Laboratory: ?Recent Labs  ?Lab 01/04/22 ?8588 01/07/22 ?2144 01/08/22 ?5027  ?WBC 2.9* 7.1 4.8  ?HGB 12.1* 12.0* 11.2*  ?HCT 37.0* 37.8* 33.5*  ?PLT 156 121* 125*  ? ?  Recent Labs  ?Lab 01/04/22 ?4320 01/07/22 ?2144 01/08/22 ?0379  ?NA 133* 137 136  ?K 3.9 4.2 4.5  ?CL 94* 101 102  ?CO2 29 24 27   ?BUN 11 15 15   ?CREATININE 0.87 0.95 0.72  ?CALCIUM 9.7 9.3 9.0  ?PROT 7.3 7.0 6.5  ?BILITOT 0.6 0.5 0.4  ?ALKPHOS 89 98 85  ?ALT 45* 64* 63*  ?AST 71* 160* 94*  ?GLUCOSE 131* 124* 140*  ? ? ? ?Imaging/Diagnostic Tests: ?DG Chest Portable 1 View ? ?Result Date: 01/07/2022 ?CLINICAL  DATA:  Dyspnea, shortness of breath. EXAM: PORTABLE CHEST 1 VIEW COMPARISON:  01/03/2022. FINDINGS: The heart size and mediastinal contours are stable. There is atherosclerotic calcification of the aorta. Sternotomy wires are noted over the midline. Stable posttreatment changes and scarring are noted in the right upper lobe. There is chronic blunting of the right costophrenic angle. There is stable bony deformity of the ribs on the right. Mild atelectasis is present at the left lung base. No pneumothorax is seen. IMPRESSION: Stable chronic changes in the right lung. Mild atelectasis at the left lung base. No acute cardiopulmonary process is identified. Electronically Signed   By: Brett Fairy M.D.   On: 01/07/2022 21:57    ? ?Shary Key, DO ?01/08/2022, 8:12 AM ?PGY-2, [place] ?[place] Intern pager: [phone], text pages welcome  ?

## 2022-01-08 NOTE — Progress Notes (Signed)
On assessment of patient bed bugs noted to be crawling on and around patients and belongings. Patient denies itching but scratch marks noted on arms legs and thighs. Personal items sealed in pt personal belongings bag and Md made aware. ?

## 2022-01-08 NOTE — Evaluation (Signed)
Physical Therapy Evaluation ?Patient Details ?Name: Lucas [name] ?MRN: [id number] ?DOB: January 14, 1951 ?Today's Date: 01/08/2022 ? ?History of Present Illness ? The pt is a 71 yo male presenting 3/17 with SOB and SpO2 at 73% upon arrival of EMS. Pt with hx of COPD, recently admitted 3/13-3/15 with CHF exacerbation and d/c home with HHPT and 3LO2. PMH includes: COPD, HLD, HTN, afib, squamous cell carcinoma, tobacco use, and alcohol abuse. ?  ?Clinical Impression ? Pt in bed upon arrival of PT, agreeable to evaluation at this time. Prior to admission the pt was mobilizing at home without use of RW, states he was mostly stable on 3L O2 after recent d/c until he was re-admitted. The pt now presents with limitations in functional mobility, strength, power, endurance, and activity tolerance due to above dx, and will continue to benefit from skilled PT to address these deficits. He was able to complete bed mobility without assist, and completed both standing marches and short bout of ambulation (10 ft) in the room with 2L O2 and single UE support. He declined further mobility due to fatigue and SOB, SpO2 was stable in 90s but RR was elevated. RT came in at end of session to don bipap. Will continue to benefit from skilled PT acutely to progress endurance and activity tolerance, suspect safe to return home with family support when medically stable.  ?   ?   ? ?Recommendations for follow up therapy are one component of a multi-disciplinary discharge planning process, led by the attending physician.  Recommendations may be updated based on patient status, additional functional criteria and insurance authorization. ? ?Follow Up Recommendations Home health PT ? ?  ?Assistance Recommended at Discharge Intermittent Supervision/Assistance  ?Patient can return home with the following ? A little help with walking and/or transfers;A little help with bathing/dressing/bathroom;Assistance with cooking/housework;Assist for transportation;Help  with stairs or ramp for entrance;Direct supervision/assist for medications management ? ?  ?Equipment Recommendations None recommended by PT  ?Recommendations for Other Services ?    ?  ?Functional Status Assessment Patient has had a recent decline in their functional status and demonstrates the ability to make significant improvements in function in a reasonable and predictable amount of time.  ? ?  ?Precautions / Restrictions Precautions ?Precautions: Fall;Other (comment) ?Precaution Comments: watch SpO2 on 2L for session ?Restrictions ?Weight Bearing Restrictions: No  ? ?  ? ?Mobility ? Bed Mobility ?Overal bed mobility: Modified Independent ?  ?  ?  ?  ?  ?  ?General bed mobility comments: Increased time for bed mobility, HOB elevated ?  ? ?Transfers ?Overall transfer level: Needs assistance ?Equipment used: None ?Transfers: Sit to/from Stand ?Sit to Stand: Supervision ?  ?  ?  ?  ?  ?General transfer comment: Supervision for safety, pt mildly impulsive needs cues for line awareness ?  ? ?Ambulation/Gait ?Ambulation/Gait assistance: Min guard, Min assist ?Gait Distance (Feet): 10 Feet ?Assistive device: 1 person hand held assist ?Gait Pattern/deviations: Step-through pattern, Decreased stride length, Trunk flexed ?Gait velocity: decreased ?Gait velocity interpretation: <1.31 ft/sec, indicative of household ambulator ?  ?General Gait Details: small steps with at least single UE support for stability. VSS on 2L ? ?  ? ?Balance Overall balance assessment: Needs assistance ?Sitting-balance support: Feet unsupported, No upper extremity supported ?Sitting balance-Leahy Scale: Good ?  ?  ?Standing balance support: Bilateral upper extremity supported ?Standing balance-Leahy Scale: Fair ?Standing balance comment: at least single UE support for gait, minG to minA to steady ?  ?  ?  ?  ?  ?  ?  ?  ?  ?  ?  ?   ? ? ? ?  Pertinent Vitals/Pain Pain Assessment ?Pain Assessment: No/denies pain  ? ? ?Home Living Family/patient  expects to be discharged to:: Private residence ?Living Arrangements: Spouse/significant other ?Available Help at Discharge: Family;Available 24 hours/day ?Type of Home: Apartment ?Home Access: Stairs to enter ?Entrance Stairs-Rails: Left ?Entrance Stairs-Number of Steps: 2 ?  ?Home Layout: One level ?Home Equipment: Conservation officer, nature (2 wheels);Cane - single point;Shower seat;Crutches ?   ?  ?Prior Function Prior Level of Function : Independent/Modified Independent ?  ?  ?  ?  ?  ?  ?Mobility Comments: Doesn to use DME ?ADLs Comments: Reports he performs light IADLs ?  ? ? ?Hand Dominance  ? Dominant Hand: Right ? ?  ?Extremity/Trunk Assessment  ? Upper Extremity Assessment ?Upper Extremity Assessment: Overall WFL for tasks assessed ?  ? ?Lower Extremity Assessment ?Lower Extremity Assessment: Overall WFL for tasks assessed ?  ? ?Cervical / Trunk Assessment ?Cervical / Trunk Assessment: Kyphotic  ?Communication  ? Communication: No difficulties  ?Cognition Arousal/Alertness: Awake/alert ?Behavior During Therapy: [facility] for tasks assessed/performed ?Overall Cognitive Status: Within Functional Limits for tasks assessed ?  ?  ?  ?  ?  ?  ?  ?  ?  ?  ?  ?  ?  ?  ?  ?  ?General Comments: pt is likely near baseline cognition, slightly impulsive and self-limiting at times due to fear of SOB. pt pleasant and agreeable through session ?  ?  ? ?  ?General Comments General comments (skin integrity, edema, etc.): Pt on 2L upon my arrival, able to maintain SpO2 in 90s but RR high and RT came in at end of session to place on bipap ? ?  ?Exercises    ? ?Assessment/Plan  ?  ?PT Assessment Patient needs continued PT services  ?PT Problem List Decreased activity tolerance;Decreased balance;Decreased knowledge of use of DME;Cardiopulmonary status limiting activity ? ?   ?  ?PT Treatment Interventions DME instruction;Gait training;Stair training;Functional mobility training;Patient/family education;Therapeutic activities;Neuromuscular  re-education;Balance training;Therapeutic exercise   ? ?PT Goals (Current goals can be found in the Care Plan section)  ?Acute Rehab PT Goals ?Patient Stated Goal: Maintain independance ?PT Goal Formulation: With patient ?Time For Goal Achievement: 01/22/22 ?Potential to Achieve Goals: Fair ? ?  ?Frequency Min 3X/week ?  ? ? ?   ?AM-PAC PT "6 Clicks" Mobility  ?Outcome Measure Help needed turning from your back to your side while in a flat bed without using bedrails?: None ?Help needed moving from lying on your back to sitting on the side of a flat bed without using bedrails?: None ?Help needed moving to and from a bed to a chair (including a wheelchair)?: A Little ?Help needed standing up from a chair using your arms (e.g., wheelchair or bedside chair)?: A Little ?Help needed to walk in hospital room?: A Little ?Help needed climbing 3-5 steps with a railing? : A Lot ?6 Click Score: 19 ? ?  ?End of Session Equipment Utilized During Treatment: Gait belt;Oxygen ?Activity Tolerance: Patient tolerated treatment well ?Patient left: in bed;with call bell/phone within reach;with bed alarm set (RT present) ?Nurse Communication: Mobility status ?PT Visit Diagnosis: Unsteadiness on feet (R26.81);Difficulty in walking, not elsewhere classified (R26.2) ?  ? ?Time: 4854-6270 ?PT Time Calculation (min) (ACUTE ONLY): 31 min ? ? ?Charges:   PT Evaluation ?$PT Eval Low Complexity: 1 Low ?PT Treatments ?$Therapeutic Exercise: 8-22 mins ?  ?   ? ? ?[place], PT, DPT  ? ?Acute Rehabilitation Department ?Pager #: [phone] ? ?Sandra Cockayne ?01/08/2022, 3:15 PM ? ?

## 2022-01-08 NOTE — Progress Notes (Signed)
OT Cancellation Note ? ?Patient Details ?Name: ADEBAYO ENSMINGER ?MRN: [id number] ?DOB: 12-18-1950 ? ? ?Cancelled Treatment:    Reason Eval/Treat Not Completed: Medical issues which prohibited therapy.  Patient currently on BiPap, OT will continue efforts as appropriate.  Was seen by PT earlier.   ? ?Trevion Hoben D Yolinda Duerr ?01/08/2022, 4:22 PM ?

## 2022-01-08 NOTE — Progress Notes (Signed)
Patient refused Lovenox.Patient educated of the need for the medication , pt continues to decline at this time. Patient stated he does not take any type of blood thinners as they cause him to have sever nose bleeds.  ?

## 2022-01-08 NOTE — Progress Notes (Signed)
FPTS Brief Progress Note ? ?S Saw patient at bedside this evening. Patient was sitting comfortably in bed. He reports his breathing is comfortable. He drinks 6 x 12oz cans of beer a day. Last drink was yesterday morning.  ? ?O: ?BP (!) 145/82 (BP Location: Left Arm)   Pulse 95   Temp 98.1 ?F (36.7 ?C) (Oral)   Resp 18   Ht 5\' 11"  (1.803 m)   Wt 64.6 kg   SpO2 100%   BMI 19.86 kg/m?   ? ?General: Alert, no acute distress, frail appearing  ?Cardio: Normal S1 and S2, RRR, no r/m/g ?Pulm: normal work of breathing, poor AE bilaterally ?Neuro: Cranial nerves grossly intact  ? ?A/P: ?Plan per day team  ?- Orders reviewed. Labs for AM ordered, which was adjusted as needed.  ?- If condition changes, plan includes repeat CXR.  ?-Monitor CIWAs ? ?Lattie Haw, MD ?01/08/2022, 10:23 PM ?PGY-3, [facility] Night Resident  ?Please page [phone] with questions.   ?

## 2022-01-09 DIAGNOSIS — J441 Chronic obstructive pulmonary disease with (acute) exacerbation: Secondary | ICD-10-CM

## 2022-01-09 LAB — BASIC METABOLIC PANEL
Anion gap: 7 (ref 5–15)
BUN: 14 mg/dL (ref 8–23)
CO2: 29 mmol/L (ref 22–32)
Calcium: 9.2 mg/dL (ref 8.9–10.3)
Chloride: 98 mmol/L (ref 98–111)
Creatinine, Ser: 0.7 mg/dL (ref 0.61–1.24)
GFR, Estimated: >60 mL/min (ref >60)
Glucose, Bld: 109 mg/dL — ABNORMAL HIGH (ref 70–99)
Potassium: 4.2 mmol/L (ref 3.5–5.1)
Sodium: 134 mmol/L — ABNORMAL LOW (ref 135–145)

## 2022-01-09 LAB — CBC
HCT: 33.1 % — ABNORMAL LOW (ref 39.0–52.0)
Hemoglobin: 10.8 g/dL — ABNORMAL LOW (ref 13.0–17.0)
MCH: 31.1 pg (ref 26.0–34.0)
MCHC: 32.6 g/dL (ref 30.0–36.0)
MCV: 95.4 fL (ref 80.0–100.0)
Platelets: 149 10*3/uL — ABNORMAL LOW (ref 150–400)
RBC: 3.47 MIL/uL — ABNORMAL LOW (ref 4.22–5.81)
RDW: 14 % (ref 11.5–15.5)
WBC: 6 10*3/uL (ref 4.0–10.5)
nRBC: 0 % (ref 0.0–0.2)

## 2022-01-09 MED ORDER — HYDROCORTISONE 1 % EX CREA
TOPICAL_CREAM | Freq: Two times a day (BID) | CUTANEOUS | Status: DC
  Administered 2022-01-09: 1 via TOPICAL
  Filled 2022-01-09: qty 28

## 2022-01-09 NOTE — Progress Notes (Signed)
Family Medicine Teaching Service ?Daily Progress Note ?Intern Pager: [phone] ? ?Patient name: Lucas [name] Medical record number: [id number] ?Date of birth: [birth year] Age: 71 y.o. Gender: male ? ?Primary Care Provider: [name], DO ?Consultants: None ?Code Status: FULL ? ?Pt Overview and Major Events to Date:  ?01/07/22 Admitted to [facility]  ? ?Assessment and Plan: ?Lucas [name] is a 71 y.o. male presenting with shortness of breath and hypoxemia likely due to COPD exacerbation. PMH is significant for COPD, HLD, HTN, a fib, Hx of squamous cell carcinoma, alcohol abuse, tobacco use ? ?Hypoxemia (resolved) acute on chronic COPD  ?Pt admitted for acute worsening shortness of breath due to concern for COPD exacerbation after recent hospitalization of the same. He is back on his home O2 (3L ) breathing comfortably.  ?- Continue steroids, will need taper  ?- Keep O2 sat >88%  ?- Breo Ellipta and Incruse Ellipta daily  ?- Albuterol PRN  ? ?Dizziness ?Reports chronic dizziness but today it is lasting longer than normal. Non-room spinning. Typically self resolves.  ?- reassess neurologic status  ? ?Anxiety; chronic, uncontrolled  ?His cardiologist started him on Atarax. Would benefit from anti-anxiety medication  ?- continue home  ?- consider SSRI  ? ?AFIB  CAD  Hx of NSTEMI  CABD x4; stable  ?-Continue cardiac monitoring  ?-Continue home Metoprolol and Crestor  ? ?History of RUL Squamous Cell Carcinoma s/ SBRT  ?Follows with oncology. ?- follow up outpatient  ? ?Hypertension  ?- continue metoprolol 25 mg BID  ? ?HLD  ?-Continue home Crestor  ? ? ? ?FEN/GI: heart healthy  ?PPx: Lovenox  ? ?Disposition: likely home 3/20  ? ?Subjective:  ?No acute overnight events.  ? ?Objective: ?Temp:  [97.7 ?F (36.5 ?C)-98.2 ?F (36.8 ?C)] 97.8 ?F (36.6 ?C) (03/19 1478) ?Pulse Rate:  [63-96] 69 (03/19 0727) ?Resp:  [12-27] 18 (03/19 0727) ?BP: (114-145)/(64-84) 134/78 (03/19 0727) ?SpO2:  [95 %-100 %] 100 % (03/19 0727) ?FiO2 (%):   [30 %] 30 % (03/18 1426) ?Weight:  [64.9 kg] 64.9 kg (03/19 0630) ?Physical Exam: ?General: pleasant chronically appearing male  ?Cardiovascular: RRR ?Respiratory: mostly clear, good air movement  ?Abdomen: soft, non-tender  ?Extremities: non-tender  ? ?Laboratory: ?Recent Labs  ?Lab 01/07/22 ?2144 01/08/22 ?2956 01/09/22 ?0319  ?WBC 7.1 4.8 6.0  ?HGB 12.0* 11.2* 10.8*  ?HCT 37.8* 33.5* 33.1*  ?PLT 121* 125* 149*  ? ?Recent Labs  ?Lab 01/04/22 ?2130 01/07/22 ?2144 01/08/22 ?8657 01/09/22 ?0319  ?NA 133* 137 136 134*  ?K 3.9 4.2 4.5 4.2  ?CL 94* 101 102 98  ?CO2 29 24 27 29   ?BUN 11 15 15 14   ?CREATININE 0.87 0.95 0.72 0.70  ?CALCIUM 9.7 9.3 9.0 9.2  ?PROT 7.3 7.0 6.5  --   ?BILITOT 0.6 0.5 0.4  --   ?ALKPHOS 89 98 85  --   ?ALT 45* 64* 63*  --   ?AST 71* 160* 94*  --   ?GLUCOSE 131* 124* 140* 109*  ? ? ? ? ?Imaging/Diagnostic Tests: ?No results found. ? ? ?[name], DO ?01/09/2022, 8:43 AM ?PGY-3, [facility] ?[place] Intern pager: [phone], text pages welcome ? ?

## 2022-01-09 NOTE — Evaluation (Signed)
Occupational Therapy Evaluation Patient Details Name: Lucas [name] MRN: [id number] DOB: [birth year] Today's Date: 01/09/2022   History of Present Illness The pt is a 71 yo male presenting 3/17 with SOB and SpO2 at 73% upon arrival of EMS. Pt with hx of COPD, recently admitted 3/13-3/15 with CHF exacerbation and d/c home with HHPT and 3LO2. PMH includes: COPD, HLD, HTN, afib, squamous cell carcinoma, tobacco use, and alcohol abuse.   Clinical Impression   Patient is currently requiring assistance with ADLs including supervision/setup to Min guard assist with Lower body ADLs, supervision.setup assist with seated Upper body ADLs,  as well as  Modified independent  with bed mobility and up to Min guard assist with functional transfers due to pt impulsivity with multiple lines.  Current level of function is below patient's typical baseline.  During this evaluation, patient was limited by generalized weakness, impaired activity tolerance, and decreased safety awareness, all of which has the potential to impact patient's safety and independence during functional mobility, as well as performance for ADLs.  Patient lives with his spouse who is able to provide 24/7 supervision and assistance and at baseline assists with bathing and most IADLs.  Patient demonstrates good rehab potential, and should benefit from continued skilled occupational therapy services while in acute care to maximize safety, independence and quality of life at home.  Due to recent readmission and need of pt to understand and practice energy conservation strategies independently after discharge, continued occupational therapy services in the home is recommended.  ?     Recommendations for follow up therapy are one component of a multi-disciplinary discharge planning process, led by the attending physician.  Recommendations may be updated based on patient status, additional functional criteria and insurance authorization.   Follow Up  Recommendations  Home health OT    Assistance Recommended at Discharge Intermittent Supervision/Assistance  Patient can return home with the following A little help with walking and/or transfers;A little help with bathing/dressing/bathroom;Assist for transportation;Assistance with cooking/housework    Functional Status Assessment  Patient has had a recent decline in their functional status and demonstrates the ability to make significant improvements in function in a reasonable and predictable amount of time.  Equipment Recommendations  Other (comment) (Pt refusing Rollator for energy conservation.)    Recommendations for Other Services       Precautions / Restrictions Precautions Precautions: Fall;Other (comment) Precaution Comments: watch SpO2 on 3L for session; Contact precautions. Restrictions Weight Bearing Restrictions: No      Mobility Bed Mobility Overal bed mobility: Modified Independent             General bed mobility comments: Increased time for bed mobility, HOB elevated. Supine<>Sit    Transfers     Transfers: Sit to/from Stand Sit to Stand: Supervision           General transfer comment: Supervision for safety, pt mildly impulsive needs cues for line awareness      Balance Overall balance assessment: Needs assistance, History of Falls (1 fall in past 6 months on the porch. Reports a fall last year when pt broke his RT collar bone.) Sitting-balance support: Feet unsupported, No upper extremity supported Sitting balance-Leahy Scale: Good     Standing balance support: No upper extremity supported Standing balance-Leahy Scale: Fair                             ADL either performed or assessed with clinical judgement  ADL                                         General ADL Comments: Pt performing ADLs and functional mobility at Supervision to Min guard level with RW, however pt states that he refuses to use any DME  at home because "I don't want to adapt to it.".     Vision Ability to See in Adequate Light: 1 Impaired (Pt reports cataracts and inability to see small print.) Additional Comments: Occasional nystagmus. Pt endorses dizziness sometimes when he coughs "Feels like I'm moving when I'm sitting still."  Pt reports that his mother had nystagmus and he was born with it but stated that it does not affect him.     Perception     Praxis      Pertinent Vitals/Pain Pain Assessment Pain Assessment: No/denies pain     Hand Dominance Right   Extremity/Trunk Assessment Upper Extremity Assessment Upper Extremity Assessment: Overall WFL for tasks assessed   Lower Extremity Assessment Lower Extremity Assessment: Overall WFL for tasks assessed   Cervical / Trunk Assessment Cervical / Trunk Assessment: Kyphotic   Communication Communication Communication: No difficulties   Cognition Arousal/Alertness: Awake/alert Behavior During Therapy: WFL for tasks assessed/performed Overall Cognitive Status: Within Functional Limits for tasks assessed                                 General Comments: Becomes anxious and subsequently impulsive with standing. Pulls at lines and stops listenng to instructions. Pt able to endorse that he does become anxious once out of bed due to only having so much energy.     General Comments       Exercises     Shoulder Instructions      Home Living Family/patient expects to be discharged to:: Private residence Living Arrangements: Spouse/significant other Available Help at Discharge: Family;Available 24 hours/day Type of Home: Apartment Home Access: Stairs to enter Entrance Stairs-Number of Steps: 2 Entrance Stairs-Rails: Left Home Layout: One level     Bathroom Shower/Tub: Chief Strategy Officer: Standard Bathroom Accessibility: Yes   Home Equipment: Agricultural consultant (2 wheels);Cane - single point;Shower seat;Crutches    Additional Comments: Oxygen at home PRN      Prior Functioning/Environment Prior Level of Function : Independent/Modified Independent       Physical Assist : ADLs (physical)   ADLs (physical): IADLs   ADLs Comments: Reports he performs light IADLs, but spouse takes care of most.  Uses [facility] for MD appointments.  Spouse assists with bathing.        OT Problem List: Decreased strength;Decreased range of motion;Decreased activity tolerance;Impaired balance (sitting and/or standing);Decreased safety awareness;Decreased knowledge of use of DME or AE      OT Treatment/Interventions: Self-care/ADL training;Therapeutic exercise;Energy conservation;DME and/or AE instruction;Therapeutic activities;Patient/family education    OT Goals(Current goals can be found in the care plan section) Acute Rehab OT Goals Patient Stated Goal: To ambulate without any AD. OT Goal Formulation: With patient Time For Goal Achievement: 01/23/22 Potential to Achieve Goals: Good ADL Goals Pt Will Transfer to Toilet: ambulating;with modified independence (With slow, controlled mobility) Additional ADL Goal #1: Patient will identify at least 3 energy conservation strategies to employ at home in order to maximize function and quality of life and decrease caregiver burden while preventing  exacerbation of symptoms and rehospitalization. Additional ADL Goal #2: Pt will engage in 10 min functional activities without loss of sitting or standing balance, while demonstrating energy conservation strategies as appropriate/needed, in order to demonstrate improved activity tolerance, safety and balance needed to perform ADLs safely at home. Additional ADL Goal #3: Patient will tolerate BUE home exercise program 10-15 reps within pain-free ranges, with an RPE no greater than 5/10 in order to improve upper body strength, endurance and core stability needed to complete home ADLs.  OT Frequency: Min 2X/week    Co-evaluation               AM-PAC OT "6 Clicks" Daily Activity     Outcome Measure Help from another person eating meals?: None Help from another person taking care of personal grooming?: A Little Help from another person toileting, which includes using toliet, bedpan, or urinal?: A Little Help from another person bathing (including washing, rinsing, drying)?: A Little Help from another person to put on and taking off regular upper body clothing?: A Little Help from another person to put on and taking off regular lower body clothing?: A Little 6 Click Score: 19   End of Session Equipment Utilized During Treatment: Oxygen  Activity Tolerance: Patient limited by fatigue Patient left: in bed;with call bell/phone within reach;with bed alarm set  OT Visit Diagnosis: Unsteadiness on feet (R26.81);Other abnormalities of gait and mobility (R26.89);Muscle weakness (generalized) (M62.81)                Time: 1610-9604 OT Time Calculation (min): 30 min Charges:  OT General Charges $OT Visit: 1 Visit OT Evaluation $OT Eval Low Complexity: 1 Low OT Treatments $Self Care/Home Management : 8-22 mins  Victorino Dike, OT Acute Rehab Services Office: [phone] 01/09/2022  Theodoro Clock 01/09/2022, 8:51 AM

## 2022-01-09 NOTE — Hospital Course (Addendum)
Lucas [name] is a 71 y.o. male presenting with shortness of breath and hypoxemia likely due to COPD exacerbation. PMH is significant for COPD, HLD, HTN, a fib, Hx of squamous cell carcinoma, alcohol abuse, tobacco use ? ? ?Hypoxemia likely secondary to COPD ?Patient was recently hospitalized from 01/03/2022-01/05/2022 for COPD exacerbation. He was discharged on Trelegy Ellipta 1 puff daily, albuterol 2 puffs every 4 hours as needed, DuoNebs every 6 hours as needed, O2 3 L [place], prednisone 40 mg daily for total of 5 days and per pt took medications appropriately. He then experienced sudden SOB on 01/07/22, was found by EMS saturating 70% on home oxygen of 3 L  and received 2 DuoNeb's, 125 methylprednisolone, 2 g magnesium prior to arrival with good response. Pt was admitted and treated with supplemental oxygen, albuterol, dounebs, incruse ellipta, breo ellipta, and prednisone. Pulmonology was consulted on day 4 of hospitalization d/t pt still experiencing SOB and recommended a prolonged course of steroids and a course of azithromycin which pt was discharged on. The also recommended follow up with pulmonary out patient. Pt had ambulatory pulse ox which showed O2 requirement of 4L during ambulation to keep O2 sat >89%. At discharge pt was breathing comfortably on home oxygen of 3 L with saturations in high 90s and recommended to use 4L during ambulation if needed.  ? ?Anxiety ?Pt was continued on home atarax 10 mg TID. Consider SSRI out pt.  ? ?Other chronic medical problems stable and treated with home medications ? ? ? ?Issues for follow up ?Ensure follow up with pulmonology ?Consider starting anti-anxiety medication  ?Ensure completion of prednisone ?Ensure completion of azithromycin ?f/u on shortness of breath  ?

## 2022-01-09 NOTE — Progress Notes (Signed)
FPTS Brief Progress Note ? ?S: Patient reports his breathing is greatly improved.  Currently on 3 L nasal cannula.  The nurse is asking about some kind of treatment for the bites on his arms.  Patient was found to have bedbugs after admission. ? ? ?O: ?BP 117/71 (BP Location: Left Arm)   Pulse 76   Temp 98 ?F (36.7 ?C) (Oral)   Resp 20   Ht 5\' 11"  (1.803 m)   Wt 64.9 kg   SpO2 100%   BMI 19.96 kg/m?   ? ? ?A/P: ?-Hydrocortisone 1% ordered for patient's bedbug bites ?- Continue current plan regarding COPD exacerbation, monitor respiratory status.  No other changes at this time ?- Orders reviewed. Labs for AM ordered, which was adjusted as needed.  ? ?Gifford Shave, MD ?01/09/2022, 8:53 PM ?PGY-3, [employer] Night Resident  ?Please page [phone] with questions.  ?  ?

## 2022-01-10 ENCOUNTER — Telehealth: Payer: Self-pay | Admitting: Pulmonary Disease

## 2022-01-10 ENCOUNTER — Telehealth: Payer: Self-pay

## 2022-01-10 ENCOUNTER — Other Ambulatory Visit (HOSPITAL_COMMUNITY): Payer: Self-pay

## 2022-01-10 DIAGNOSIS — J449 Chronic obstructive pulmonary disease, unspecified: Secondary | ICD-10-CM

## 2022-01-10 LAB — BASIC METABOLIC PANEL
Anion gap: 6 (ref 5–15)
BUN: 11 mg/dL (ref 8–23)
CO2: 31 mmol/L (ref 22–32)
Calcium: 9.2 mg/dL (ref 8.9–10.3)
Chloride: 99 mmol/L (ref 98–111)
Creatinine, Ser: 0.73 mg/dL (ref 0.61–1.24)
GFR, Estimated: >60 mL/min (ref >60)
Glucose, Bld: 97 mg/dL (ref 70–99)
Potassium: 4.1 mmol/L (ref 3.5–5.1)
Sodium: 136 mmol/L (ref 135–145)

## 2022-01-10 LAB — CBC
HCT: 35.6 % — ABNORMAL LOW (ref 39.0–52.0)
Hemoglobin: 12 g/dL — ABNORMAL LOW (ref 13.0–17.0)
MCH: 32.2 pg (ref 26.0–34.0)
MCHC: 33.7 g/dL (ref 30.0–36.0)
MCV: 95.4 fL (ref 80.0–100.0)
Platelets: 171 10*3/uL (ref 150–400)
RBC: 3.73 MIL/uL — ABNORMAL LOW (ref 4.22–5.81)
RDW: 13.7 % (ref 11.5–15.5)
WBC: 4.4 10*3/uL (ref 4.0–10.5)
nRBC: 0 % (ref 0.0–0.2)

## 2022-01-10 MED ORDER — HYDROCORTISONE 1 % EX CREA
TOPICAL_CREAM | Freq: Two times a day (BID) | CUTANEOUS | 0 refills | Status: AC
  Filled 2022-01-10: qty 30, 14d supply, fill #0

## 2022-01-10 MED ORDER — AZITHROMYCIN 250 MG PO TABS
500.0000 mg | ORAL_TABLET | Freq: Once | ORAL | Status: AC
Start: 2022-01-10 — End: 2022-01-10
  Administered 2022-01-10: 500 mg via ORAL
  Filled 2022-01-10: qty 2

## 2022-01-10 MED ORDER — AZITHROMYCIN 250 MG PO TABS
250.0000 mg | ORAL_TABLET | Freq: Every day | ORAL | Status: DC
  Administered 2022-01-11: 250 mg via ORAL
  Filled 2022-01-10: qty 1

## 2022-01-10 MED ORDER — PREDNISONE 50 MG PO TABS
ORAL_TABLET | ORAL | 0 refills | Status: DC
  Filled 2022-01-10: qty 5, 5d supply, fill #0

## 2022-01-10 NOTE — Progress Notes (Signed)
Refusing Lovenox. States, "I cant take that, it makes my nose bleed." Education provided, but refused anyway. ?

## 2022-01-10 NOTE — Telephone Encounter (Signed)
Lucas [name] from [facility] calling for PT verbal orders as follows: ? ?1 time(s) weekly for 9 week(s).  ? ?Verbal orders given per [facility] protocol ? ?Talbot Grumbling, RN ? ?

## 2022-01-10 NOTE — Progress Notes (Signed)
Spoke with pt regarding discharge plans and asked pt if his ride would be able to bring his home O2. Pt stated he didn't need it to go home. Advised pt of our policy will ask Case Management to order O2 for discharge and also speak with pt.  ?

## 2022-01-10 NOTE — Consult Note (Signed)
? ?  [facility] Inpatient Consult ? ? ?01/10/2022 ? ?Lucas [name] ?[birth year] ?[id number] ? ?[facility] [ACO] Patient:  Lucas [name] ? ?Primary Care Provider:  [name], DO, [facility], is an embedded provider with a Chronic Care Management team and program, and is listed for the transition of care follow up and appointments. ? ?Patient was screened for readmission Embedded practice service needs for chronic care management has a history with the team.  Patient is active with [facility] program is noted in encounter review.. ? ?*Did not met with patient as patient is on contact precaution for bed bugs* ? ?Plan: A referral request can be sent to the [place] Management if needs arise for post hospital CCM. ? ?Please contact for further questions, ? ?Natividad Brood, RN BSN CCM ?[facility] Liaison ? [phone] business mobile phone ?Toll free office [phone]  ?Fax number: [phone] ?[email or web address] ?[email or web address] ? ? ? ?

## 2022-01-10 NOTE — Discharge Instructions (Addendum)
Dear Lucas [name], ? ?Thank you for letting us participate in your care. You were hospitalized for  and diagnosed with COPD (chronic obstructive pulmonary disease). You were treated with supplemental oxygen, breathing treatments, antibiotics and steroids.  ? ?POST-HOSPITAL & CARE INSTRUCTIONS ?Continue to use your home inhalers as prescribed. Be sure to use your Trelegy Ellipta inhaler every day.  ?Continue to take prednisone for 5 additional days after discharging ?Continue to take your antibiotic, Azithromycin daily until complete ?Use your home oxygen of 3L while at rest. When you get up and walk, you can increase to 4L if needed.  ?Go to your follow up appointments (listed below) ? ? ?DOCTOR'S APPOINTMENT   ?Future Appointments  ?Date Time Provider [place]  ?01/17/2022  9:30 AM ACCESS TO CARE POOL [facility]  ?01/19/2022 11:00 AM Martyn Ehrich, NP [facility] None  ?02/28/2022  1:00 PM Hayden Pedro, PA-C [facility] None  ? ? Follow-up Information   ? ? [facility], [place] Follow up.   ?Specialty: Home Health Services ?Why: HHPT,HHOT Agency will call you for apt times ?Contact information: ?[address] ?[address] ?[place] [address] ?[phone] ? ? ?  ?  ? ? Martinique, Peter M, MD .   ?Specialty: Cardiology ?Contact information: ?[address] ?[address] ?[place] [address] ?[phone] ? ? ?  ?  ? ? Brimage, Ronnette Juniper, DO. Go on 01/17/2022.   ?Specialty: Family Medicine ?Why: Appointment time is 9:30 am ?Contact information: ?[address] [address] ?[phone] ? ? ?  ?  ? ? Martinique, Peter M, MD .   ?Specialty: Cardiology ?Contact information: ?[address] ?[address] ?[place] [address] ?[phone] ? ? ?  ?  ? ?  ?  ? ?  ? ? ?Take care and be well! ? ?Family Medicine Teaching Service Inpatient Team ?[place]  ?[facility]  ?[address] [place], [place] [address] ?[phone] ? ? ? ? ?

## 2022-01-10 NOTE — Progress Notes (Signed)
RT at bedside to find pt on 2L [place] with BiPAP on stby. Pt tolerating well at this time. RT will continue to monitor pt. ?

## 2022-01-10 NOTE — Progress Notes (Signed)
FPTS Brief Progress Note ? ?S: Patient is sitting comfortably on evaluation.  He has no concerns at this time. ? ? ?O: ?BP 122/75 (BP Location: Right Arm)   Pulse 95   Temp 98.4 ?F (36.9 ?C) (Oral)   Resp 18   Ht 5\' 11"  (1.803 m)   Wt 64.9 kg   SpO2 100%   BMI 19.96 kg/m?   ? ? ?A/P: ?Patient was evaluated by pulmonology today who recommended continuing his ICS/LABA/LAMA inhaler therapy.  They did add azithromycin and recommended extended steroid taper.  No changes at this time. ?- Orders reviewed. Labs for AM ordered, which was adjusted as needed.  ? ?Gifford Shave, MD ?01/10/2022, 9:45 PM ?PGY-3, [place] Night Resident  ?Please page [phone] with questions.  ? ? ?

## 2022-01-10 NOTE — Progress Notes (Signed)
RT Note:  BIPAP on stand-by if needed. No distress noted at this time.  ?

## 2022-01-10 NOTE — Progress Notes (Addendum)
Family Medicine Teaching Service ?Daily Progress Note ?Intern Pager: [phone] ? ?Patient name: Lucas [name] Medical record number: [id number] ?Date of birth: [birth year] Age: 71 y.o. Gender: male ? ?Primary Care Provider: [name], DO ?Consultants: none ?Code Status: Full ? ?Pt Overview and Major Events to Date:  ?01/07/2022-admitted ? ?Assessment and Plan: ?LEODAN BOLYARD is a 71 year old male presenting with shortness of breath and hypoxemia likely due to COPD exacerbation.  PMH significant for COPD, HLD, HTN, A-fib, history of squamous cell carcinoma, alcohol use, tobacco use ? ?Hypoxemia, resolved  acute on chronic COPD ?Patient was admitted due to SOB secondary to likely COPD after recent hospitalization for COPD exacerbation.  Patient is now on his home oxygen of 3 L nasal cannula and breathing comfortably. ?-Continue prednisone 50 mg daily (This is day 8 of steroids) ?-Keep O2 sat greater than 88% ?-Breo Ellipta and Incruse Ellipta daily ?-Albuterol q2h as needed (had not used) ?-Ambulatory pulse ox ? ?Dizziness ?Patient complained of dizziness yesterday, states it is chronic but was worse yesterday. Did not complain of dizziness today. ? ?Anxiety chronic and uncontrolled ?Home medication includes Atarax 10 mg 3 times daily ?-Continue Atarax ?-Consider SSRI out pt ? ?A-fib   CAD history of an  CABG x4, stable ?-Continuous cardiac monitoring ?-Continue home metoprolol and Crestor ? ?History of RUL squamous cell carcinoma ?Patient follows with oncology ?-Follow-up outpatient ? ?HTN ?-continue home metoprolol 25 mg twice daily ? ?HLD ?-Continue home Crestor ? ?FEN/GI: Heart healthy ?PPx: Lovenox-patient refused this morning ?Dispo: Home today ? ?Subjective:  ?Pt states he is breathing comfortably this morning on 2 L but requests to be on his home 3L, states he can tell a difference between the two. No concerns at this time and would like to go home today.  ? ?Objective: ?Temp:  [97.7 ?F (36.5 ?C)-98.4 ?F  (36.9 ?C)] 98.4 ?F (36.9 ?C) (03/20 0400) ?Pulse Rate:  [69-106] 77 (03/20 0400) ?Resp:  [12-25] 20 (03/20 0400) ?BP: (107-134)/(67-78) 112/70 (03/20 0400) ?SpO2:  [94 %-100 %] 100 % (03/20 0400) ?Weight:  [64.9 kg] 64.9 kg (03/19 0630) ?Physical Exam: ?General: Pt sitting up in bed, NAD ?Cardiovascular: RRR,  ?Respiratory: CTAB, normal effort ?Abdomen: soft, non tender, non distended ? ? ?Laboratory: ?Recent Labs  ?Lab 01/08/22 ?6387 01/09/22 ?0319 01/10/22 ?5643  ?WBC 4.8 6.0 4.4  ?HGB 11.2* 10.8* 12.0*  ?HCT 33.5* 33.1* 35.6*  ?PLT 125* 149* 171  ? ?Recent Labs  ?Lab 01/04/22 ?3295 01/07/22 ?2144 01/08/22 ?1884 01/09/22 ?0319 01/10/22 ?1660  ?NA 133* 137 136 134* 136  ?K 3.9 4.2 4.5 4.2 4.1  ?CL 94* 101 102 98 99  ?CO2 29 24 27 29 31   ?BUN 11 15 15 14 11   ?CREATININE 0.87 0.95 0.72 0.70 0.73  ?CALCIUM 9.7 9.3 9.0 9.2 9.2  ?PROT 7.3 7.0 6.5  --   --   ?BILITOT 0.6 0.5 0.4  --   --   ?ALKPHOS 89 98 85  --   --   ?ALT 45* 64* 63*  --   --   ?AST 71* 160* 94*  --   --   ?GLUCOSE 131* 124* 140* 109* 97  ? ? ? ?Precious Gilding, DO ?01/10/2022, 4:58 AM ?PGY-1, [place] ?[place] Intern pager: [phone], text pages welcome ? ?

## 2022-01-10 NOTE — Consult Note (Signed)
? ?NAME:  Lucas [name], MRN:  [id number], DOB:  11/30/1950, LOS: 2 ?ADMISSION DATE:  01/07/2022, CONSULTATION DATE:  3/20 ?REFERRING MD:  Dr. Ardelia Mems, CHIEF COMPLAINT:  SOB  ? ?History of Present Illness:  ?71 year old male with PMH as below, which is significant for COPD on 3L, Lung cancer in RUL s/p SBRT, and hypertension. He reports good compliance with [facility] over the past few years. He continues to smoke. Last cigarette was prior to admission 3/13. He was admitted for COPD exacerbation and treated with the usual therapies including bronchodilators, steroids, and antibiotics. He was then discharged 3/15. He then presented [facility] to [facility] ED 3/18 with similar complaints including shortness of breath, hypoxia with sats to 70% on 3L. He was felt to again be suffering from COPD exacerbation and admitted to the FMTS service with treatment including BiPAP, inhaled bronchodilators,  and PRN nebs. Despite treatment he continued to be dyspneic and hypoxic with exertion. PCCM was asked to evaluate.  ? ?Pertinent  Medical History  ? has a past medical history of Acute blood loss anemia (06/21/2018), Acute upper GI bleed (06/21/2018), Allergy, Bilateral AVN of femurs ([place]) (06/25/2018), COPD (12/21/2006), Environmental allergies, History of femur fracture (06/25/2018), History of Fracture of right tibia (06/26/2018), History of multiple right thoacic rib fractures (06/25/2018), History of Right clavicular fracture (06/25/2018), HYPERLIPIDEMIA (12/21/2006), HYPERTENSION, BENIGN SYSTEMIC (12/21/2006), Mallory-Weiss tear, and TOBACCO DEPENDENCE (12/21/2006). ? ? ?Significant Hospital Events: ?Including procedures, antibiotic start and stop dates in addition to other pertinent events   ? ? ?Interim History / Subjective:  ? ? ?Objective   ?Blood pressure 117/73, pulse 65, temperature 97.8 ?F (36.6 ?C), temperature source Oral, resp. rate 20, height 5\' 11"  (1.803 m), weight 64.9 kg, SpO2 97 %. ?   ?   ? ?Intake/Output Summary (Last 24 hours)  at 01/10/2022 1806 ?Last data filed at 01/10/2022 1735 ?Gross per 24 hour  ?Intake 940 ml  ?Output 1900 ml  ?Net -960 ml  ? ?Filed Weights  ? 01/08/22 0038 01/08/22 0400 01/09/22 0630  ?Weight: 64.6 kg 64.6 kg 64.9 kg  ? ? ?Examination: ?General: Frail elderly appearing gentleman in NAD ?HENT: [place]/AT, PERRL, no JVD ?Lungs: Clear bilateral breath sounds. No wheeze ?Cardiovascular: RRR, no MRG ?Abdomen: Soft, non-tender, non-distended ?Extremities: No acute deformity. No edema.  ?Neuro: Alert, oriented, non-focal ? ?Resolved Hospital Problem list   ? ? ?Assessment & Plan:  ? ?Acute on chronic hypoxemic respiratory failure: likely secondary to acute exacerbation of COPD. He describes no infectious symptoms and has no evidence of volume overload clinically. CXR and CTA within the past week show chronic changes related to SBRT. It is worrisome that he deteriorated at home while still on steroids.  ?- Continue prednisone. Would plan for prolonged taper considering recurrent symptoms while still on steroids.  ?- Continue Trelegy ?- Supplemental O2 home flow 3L ?- Mobilize, PT ?- Incentive spirometry ?- Will arrange follow up in the pulmonary clinic upon discharge.  ?- Smoking cessation counseling. Last cigarette  3/13. ? ?Best Practice (right click and "Reselect all SmartList Selections" daily)  ? ?Per primary ? ?Labs   ?CBC: ?Recent Labs  ?Lab 01/04/22 ?2025 01/07/22 ?2144 01/08/22 ?4270 01/09/22 ?0319 01/10/22 ?6237  ?WBC 2.9* 7.1 4.8 6.0 4.4  ?NEUTROABS  --  4.2  --   --   --   ?HGB 12.1* 12.0* 11.2* 10.8* 12.0*  ?HCT 37.0* 37.8* 33.5* 33.1* 35.6*  ?MCV 94.6 98.7 94.6 95.4 95.4  ?PLT 156 121*  125* 149* 171  ? ? ?Basic Metabolic Panel: ?Recent Labs  ?Lab 01/03/22 ?2151 01/04/22 ?2297 01/07/22 ?2144 01/08/22 ?9892 01/09/22 ?0319 01/10/22 ?1194  ?NA  --  133* 137 136 134* 136  ?K  --  3.9 4.2 4.5 4.2 4.1  ?CL  --  94* 101 102 98 99  ?CO2  --  29 24 27 29 31   ?GLUCOSE  --  131* 124* 140* 109* 97  ?BUN  --  11 15 15 14 11    ?CREATININE  --  0.87 0.95 0.72 0.70 0.73  ?CALCIUM  --  9.7 9.3 9.0 9.2 9.2  ?MG 2.0  --   --   --   --   --   ? ?GFR: ?Estimated Creatinine Clearance: 77.7 mL/min (by C-G formula based on SCr of 0.73 mg/dL). ?Recent Labs  ?Lab 01/07/22 ?2144 01/07/22 ?2202 01/08/22 ?1740 01/09/22 ?0319 01/10/22 ?8144  ?WBC 7.1  --  4.8 6.0 4.4  ?LATICACIDVEN  --  3.3* 1.0  --   --   ? ? ?Liver Function Tests: ?Recent Labs  ?Lab 01/04/22 ?8185 01/07/22 ?2144 01/08/22 ?6314  ?AST 71* 160* 94*  ?ALT 45* 64* 63*  ?ALKPHOS 89 98 85  ?BILITOT 0.6 0.5 0.4  ?PROT 7.3 7.0 6.5  ?ALBUMIN 3.8 3.6 3.2*  ? ?No results for input(s): LIPASE, AMYLASE in the last 168 hours. ?No results for input(s): AMMONIA in the last 168 hours. ? ?ABG ?   ?Component Value Date/Time  ? PHART 7.440 09/29/2020 0551  ? PCO2ART 45.3 09/29/2020 0551  ? PO2ART 70 (L) 09/29/2020 0551  ? HCO3 29.8 (H) 01/08/2022 0445  ? TCO2 32 01/03/2022 0810  ? ACIDBASEDEF 0.3 09/07/2020 0442  ? O2SAT 98.3 01/08/2022 0445  ?  ? ?Coagulation Profile: ?No results for input(s): INR, PROTIME in the last 168 hours. ? ?Cardiac Enzymes: ?No results for input(s): CKTOTAL, CKMB, CKMBINDEX, TROPONINI in the last 168 hours. ? ?HbA1C: ?Hgb A1c MFr Bld  ?Date/Time Value Ref Range Status  ?09/07/2020 04:42 AM 5.5 4.8 - 5.6 % Final  ?  Comment:  ?  (NOTE) ?Pre diabetes:          5.7%-6.4% ? ?Diabetes:              >6.4% ? ?Glycemic control for   <7.0% ?adults with diabetes ?  ?05/02/2020 12:28 PM 5.5 4.8 - 5.6 % Final  ?  Comment:  ?  (NOTE) ?Pre diabetes:          5.7%-6.4% ? ?Diabetes:              >6.4% ? ?Glycemic control for   <7.0% ?adults with diabetes ?  ? ? ?CBG: ?No results for input(s): GLUCAP in the last 168 hours. ? ?Review of Systems:   ?Bolds are positive  ?Constitutional: weight loss, gain, night sweats, Fevers, chills, fatigue .  ?HEENT: headaches, Sore throat, sneezing, nasal congestion, post nasal drip, Difficulty swallowing, Tooth/dental problems, visual complaints visual changes,  ear ache ?CV:  chest pain, radiates:,Orthopnea, PND, swelling in lower extremities dizziness, palpitations, syncope.  ?GI  heartburn, indigestion, abdominal pain, nausea, vomiting, diarrhea, change in bowel habits, loss of appetite, bloody stools.  ?Resp: cough, productive: , hemoptysis, dyspnea, chest pain, pleuritic.  ?Skin: rash or itching or icterus ?GU: dysuria, change in color of urine, urgency or frequency. flank pain, hematuria  ?MS: joint pain or swelling. decreased range of motion  ?Psych: change in mood or affect. depression or anxiety.  ?Neuro: difficulty with speech,  weakness, numbness, ataxia  ? ? ?Past Medical History:  ?He,  has a past medical history of Acute blood loss anemia (06/21/2018), Acute upper GI bleed (06/21/2018), Allergy, Bilateral AVN of femurs ([place]) (06/25/2018), COPD (12/21/2006), Environmental allergies, History of femur fracture (06/25/2018), History of Fracture of right tibia (06/26/2018), History of multiple right thoacic rib fractures (06/25/2018), History of Right clavicular fracture (06/25/2018), HYPERLIPIDEMIA (12/21/2006), HYPERTENSION, BENIGN SYSTEMIC (12/21/2006), Mallory-Weiss tear, and TOBACCO DEPENDENCE (12/21/2006).  ? ?Surgical History:  ? ?Past Surgical History:  ?Procedure Laterality Date  ? ANKLE SURGERY    ? left ankle  ? APPLICATION OF WOUND VAC  01/06/2012  ? Procedure: APPLICATION OF WOUND VAC;  Surgeon: Meredith Pel, MD;  Location: [facility];  Service: Orthopedics;  Laterality: Left;  ? APPLICATION OF WOUND VAC Right 10/17/2015  ? Procedure: APPLICATION OF WOUND VAC;  Surgeon: Leandrew Koyanagi, MD;  Location: [facility];  Service: Orthopedics;  Laterality: Right;  ? BIOPSY  06/22/2018  ? Procedure: BIOPSY;  Surgeon: Dorothea Ogle v, DO;  Location: [facility];  Service: Gastroenterology;;  ? BRONCHIAL BIOPSY  05/04/2020  ? Procedure: BRONCHIAL BIOPSIES;  Surgeon: Garner Nash, DO;  Location: [facility];  Service: Pulmonary;;  ? BRONCHIAL BRUSHINGS  05/04/2020  ? Procedure:  BRONCHIAL BRUSHINGS;  Surgeon: Garner Nash, DO;  Location: [facility];  Service: Pulmonary;;  ? BRONCHIAL NEEDLE ASPIRATION BIOPSY  05/04/2020  ? Procedure: BRONCHIAL NEEDLE ASPIRATION BIOPSIES;  Renford Dills

## 2022-01-10 NOTE — Progress Notes (Signed)
Physical Therapy Treatment ?Patient Details ?Name: Lucas [name] ?MRN: [id number] ?DOB: [birth year] ?Today's Date: 01/10/2022 ? ? ?History of Present Illness The pt is a 71 yo male presenting 3/17 with SOB and SpO2 at 73% upon arrival of EMS. Pt with hx of COPD, recently admitted 3/13-3/15 with CHF exacerbation and d/c home with HHPT and 3LO2. PMH includes: COPD, HLD, HTN, afib, squamous cell carcinoma, tobacco use, and alcohol abuse. ? ?  ?PT Comments  ? ? The pt was able to demo good progress with ambulation and OOB mobility this afternoon. With O2 on 3L, he was able to complete multiple short bouts of walking (20 ft + 40 ft + 40 ft) with seated rest between each bout and Spo2 > 95%. The pt did report 2/4 DOE after second walking bout, but recovered in 1-2 min after seated rest. The pt will continue to benefit from skilled PT to progress endurance and dynamic stability as he used RW for BUE support today, but states a RW will not fit through his home, will complete future sessions without RW to mimic mobility at home.  ?  ?Recommendations for follow up therapy are one component of a multi-disciplinary discharge planning process, led by the attending physician.  Recommendations may be updated based on patient status, additional functional criteria and insurance authorization. ? ?Follow Up Recommendations ? Home health PT ?  ?  ?Assistance Recommended at Discharge Intermittent Supervision/Assistance  ?Patient can return home with the following A little help with walking and/or transfers;A little help with bathing/dressing/bathroom;Assistance with cooking/housework;Assist for transportation;Help with stairs or ramp for entrance;Direct supervision/assist for medications management ?  ?Equipment Recommendations ? None recommended by PT  ?  ?Recommendations for Other Services   ? ? ?  ?Precautions / Restrictions Precautions ?Precautions: Fall;Other (comment) ?Precaution Comments: watch SpO2 on 3L for  session ?Restrictions ?Weight Bearing Restrictions: No  ?  ? ?Mobility ? Bed Mobility ?Overal bed mobility: Modified Independent ?  ?  ?  ?  ?  ?  ?General bed mobility comments: Increased time for bed mobility, HOB elevated ?  ? ?Transfers ?Overall transfer level: Needs assistance ?Equipment used: None ?Transfers: Sit to/from Stand ?Sit to Stand: Supervision ?  ?  ?  ?  ?  ?General transfer comment: Supervision for safety, pt mildly impulsive needs cues for line awareness ?  ? ?Ambulation/Gait ?Ambulation/Gait assistance: Min guard ?Gait Distance (Feet): 20 Feet (+ 40 ft + 40 ft) ?Assistive device: Rolling walker (2 wheels) ?Gait Pattern/deviations: Step-through pattern, Decreased stride length, Trunk flexed ?Gait velocity: decreased ?Gait velocity interpretation: <1.31 ft/sec, indicative of household ambulator ?  ?General Gait Details: small steps but steady with BUE support. VSS on 3L with pt reporting minimal SOB ? ? ? ?  ?Balance Overall balance assessment: Needs assistance ?Sitting-balance support: Feet unsupported, No upper extremity supported ?Sitting balance-Leahy Scale: Good ?  ?  ?Standing balance support: Bilateral upper extremity supported ?Standing balance-Leahy Scale: Fair ?Standing balance comment: at least single UE support for gait, minG to minA to steady ?  ?  ?  ?  ?  ?  ?  ?  ?  ?  ?  ?  ? ?  ?Cognition Arousal/Alertness: Awake/alert ?Behavior During Therapy: [facility] for tasks assessed/performed ?Overall Cognitive Status: Within Functional Limits for tasks assessed ?  ?  ?  ?  ?  ?  ?  ?  ?  ?  ?  ?  ?  ?  ?  ?  ?General Comments:  pt able to follow all instructions and monitor exertion well ?  ?  ? ?  ?Exercises   ? ?  ?General Comments General comments (skin integrity, edema, etc.): VSS on 3L through session with SpO2 > 95%. pt with 2/4 DOE after 2nd walking bout, recovers with seated rest ?  ?  ? ?Pertinent Vitals/Pain Pain Assessment ?Pain Assessment: No/denies pain  ? ? ? ?PT Goals (current  goals can now be found in the care plan section) Acute Rehab PT Goals ?Patient Stated Goal: Maintain independence ?PT Goal Formulation: With patient ?Time For Goal Achievement: 01/22/22 ?Potential to Achieve Goals: Fair ?Progress towards PT goals: Progressing toward goals ? ?  ?Frequency ? ? ? Min 3X/week ? ? ? ?  ?PT Plan Current plan remains appropriate  ? ? ?   ?AM-PAC PT "6 Clicks" Mobility   ?Outcome Measure ? Help needed turning from your back to your side while in a flat bed without using bedrails?: None ?Help needed moving from lying on your back to sitting on the side of a flat bed without using bedrails?: None ?Help needed moving to and from a bed to a chair (including a wheelchair)?: A Little ?Help needed standing up from a chair using your arms (e.g., wheelchair or bedside chair)?: A Little ?Help needed to walk in hospital room?: A Little ?Help needed climbing 3-5 steps with a railing? : A Lot ?6 Click Score: 19 ? ?  ?End of Session Equipment Utilized During Treatment: Gait belt;Oxygen ?Activity Tolerance: Patient tolerated treatment well ?Patient left: in bed;with call bell/phone within reach;with bed alarm set ?Nurse Communication: Mobility status ?PT Visit Diagnosis: Unsteadiness on feet (R26.81);Difficulty in walking, not elsewhere classified (R26.2) ?  ? ? ?Time: 1751-0258 ?PT Time Calculation (min) (ACUTE ONLY): 29 min ? ?Charges:  $Therapeutic Exercise: 23-37 mins          ?          ? ?[place], PT, DPT  ? ?Acute Rehabilitation Department ?Pager #: [phone] ? ? ? ?Sandra Cockayne ?01/10/2022, 4:30 PM ? ?

## 2022-01-10 NOTE — Progress Notes (Signed)
Pt ambulated to the sink in pt room on RA spo2 87% placed back on 02 3L spo2 97%. Pt became very SOB.  ?

## 2022-01-10 NOTE — TOC Transition Note (Addendum)
Transition of Care (TOC) - CM/SW Discharge Note ? ? ?Patient Details  ?Name: Lucas [name] ?MRN: [id number] ?Date of Birth: May 29, 1951 ? ?Transition of Care (TOC) CM/SW Contact:  ?Zenon Mayo, RN ?Phone Number: ?01/10/2022, 11:37 AM ? ? ?Clinical Narrative:    ?Patient is set up with [facility] for HHPT, HHOT, NCM asked MD to put in [facility] orders . Patient is for possible dc today.  NCM notified Marjory Lies with [facility]. Patient states he will need transportation ast at discharge. He states he does not want SA resources.  ? ? ?Final next level of care: [place] ?Barriers to Discharge: No Barriers Identified ? ? ?Patient Goals and CMS Choice ?  ?  ?  ? ?Discharge Placement ?  ?           ?  ?  ?  ?  ? ?Discharge Plan and Services ?  ?  ?           ?  ?DME Agency: NA ?  ?  ?  ?HH Arranged: OT, PT ?[place] Agency: [place] ?Date HH Agency Contacted: 01/05/22 ?Time [place]: 4098 ?Representative spoke with at [place]: [place] ? ?Social Determinants of Health (SDOH) Interventions ?  ? ? ?Readmission Risk Interventions ?Readmission Risk Prevention Plan 10/01/2020 09/10/2020 12/23/2019  ?Transportation Screening Complete Complete Complete  ?PCP or Specialist Appt within 5-7 Days - - Complete  ?Home Care Screening - - Complete  ?Medication Review (RN CM) - - Complete  ?PCP or Specialist appointment within 3-5 days of discharge Complete Complete -  ?[place] or [place] Complete Patient refused -  ?SW Recovery Care/Counseling Consult Complete Complete -  ?Palliative Care Screening Not Applicable Not Applicable -  ?[place] Not Applicable Not Applicable -  ?Some recent data might be hidden  ? ? ? ? ? ?

## 2022-01-11 ENCOUNTER — Other Ambulatory Visit (HOSPITAL_COMMUNITY): Payer: Self-pay

## 2022-01-11 MED ORDER — AZITHROMYCIN 250 MG PO TABS
250.0000 mg | ORAL_TABLET | Freq: Every day | ORAL | 0 refills | Status: AC
  Filled 2022-01-11: qty 3, 3d supply, fill #0

## 2022-01-11 MED ORDER — PREDNISONE 10 MG PO TABS
ORAL_TABLET | ORAL | 0 refills | Status: DC
  Filled 2022-01-11: qty 10, 4d supply, fill #0

## 2022-01-11 NOTE — Telephone Encounter (Signed)
Patient scheduled with Derl Barrow, NP on 01/19/2022 at 11am. Nothing further needed. ?

## 2022-01-11 NOTE — Discharge Summary (Signed)
Family Medicine Teaching Service ?Hospital Discharge Summary ? ?Patient name: Lucas [name] Medical record number: [id number] ?Date of birth: 1951/03/22 Age: 71 y.o. Gender: male ?Date of Admission: 01/07/2022  Date of Discharge: 01/11/22 ?Admitting Physician: Precious Gilding, DO ? ?Primary Care Provider: [name], DO ?Consultants: pulmonology ? ?Indication for Hospitalization: hypoxemia 2/2 to COPD ? ?Discharge Diagnoses/Problem List:  ?Principal Problem: ?  COPD (chronic obstructive pulmonary disease) ([place]) ?Active Problems: ?  COPD with exacerbation ([place]) ? ? ? ?Disposition: home ? ?Discharge Condition: stable ? ?Discharge Exam:  ?General: Pt sitting up in bed, watching TV, NAD ?Cardiovascular: RRR, normal S1/S2 ?Respiratory: good air movement, mild rhonchorous sounds at bases with barely audible wheeze ?Abdomen: soft, non distended, non tender ? ?Brief Hospital Course:  ?Lucas [name] is a 71 y.o. male presenting with shortness of breath and hypoxemia likely due to COPD exacerbation. PMH is significant for COPD, HLD, HTN, a fib, Hx of squamous cell carcinoma, alcohol abuse, tobacco use ? ? ?Hypoxemia likely secondary to COPD ?Patient was recently hospitalized from 01/03/2022-01/05/2022 for COPD exacerbation. He was discharged on Trelegy Ellipta 1 puff daily, albuterol 2 puffs every 4 hours as needed, DuoNebs every 6 hours as needed, O2 3 L [place], prednisone 40 mg daily for total of 5 days and per pt took medications appropriately. He then experienced sudden SOB on 01/07/22, was found by EMS saturating 70% on home oxygen of 3 L [place] and received 2 DuoNeb's, 125 methylprednisolone, 2 g magnesium prior to arrival with good response. Pt was admitted and treated with supplemental oxygen, albuterol, dounebs, incruse ellipta, breo ellipta, and prednisone. Pulmonology was consulted on day 4 of hospitalization d/t pt still experiencing SOB and recommended a prolonged course of steroids and a course of azithromycin which pt was  discharged on. The also recommended follow up with pulmonary out patient. Pt had ambulatory pulse ox which showed O2 requirement of 4L during ambulation to keep O2 sat >89%. At discharge pt was breathing comfortably on home oxygen of 3 L with saturations in high 90s and recommended to use 4L during ambulation if needed.  ? ?Anxiety ?Pt was continued on home atarax 10 mg TID. Consider SSRI out pt.  ? ?Other chronic medical problems stable and treated with home medications ? ? ? ?Issues for follow up ?Ensure follow up with pulmonology ?Consider starting anti-anxiety medication  ?Ensure completion of prednisone ?Ensure completion of azithromycin ?f/u on shortness of breath  ? ? ? ? ?Significant Procedures: none ? ?Significant Labs and Imaging:  ?Recent Labs  ?Lab 01/08/22 ?9562 01/09/22 ?0319 01/10/22 ?1308  ?WBC 4.8 6.0 4.4  ?HGB 11.2* 10.8* 12.0*  ?HCT 33.5* 33.1* 35.6*  ?PLT 125* 149* 171  ? ?Recent Labs  ?Lab 01/07/22 ?2144 01/08/22 ?6578 01/09/22 ?0319 01/10/22 ?4696  ?NA 137 136 134* 136  ?K 4.2 4.5 4.2 4.1  ?CL 101 102 98 99  ?CO2 24 27 29 31   ?GLUCOSE 124* 140* 109* 97  ?BUN 15 15 14 11   ?CREATININE 0.95 0.72 0.70 0.73  ?CALCIUM 9.3 9.0 9.2 9.2  ?ALKPHOS 98 85  --   --   ?AST 160* 94*  --   --   ?ALT 64* 63*  --   --   ?ALBUMIN 3.6 3.2*  --   --   ? ? ? ? ?Results/Tests Pending at Time of Discharge: none ? ?Discharge Medications:  ?Allergies as of 01/11/2022   ? ?   Reactions  ? Ace Inhibitors Swelling  ? Ampicillin  Nausea And Vomiting  ? Penicillins Nausea And Vomiting  ? Did it involve swelling of the face/tongue/throat, SOB, or low BP? N ?Did it involve sudden or severe rash/hives, skin peeling, or any reaction on the inside of your mouth or nose? N ?Did you need to seek medical attention at a hospital or doctor's office? N ?When did it last happen?   Teenager    ?If all above answers are ?NO?, may proceed with cephalosporin use.  ? Codeine Nausea And Vomiting  ? Lisinopril Swelling  ? REACTION: angioedema  ?  Aspirin   ? Nose bleeds  ? ?  ? ?  ?Medication List  ?  ? ?TAKE these medications   ? ?albuterol 108 (90 Base) MCG/ACT inhaler ?Commonly known as: VENTOLIN HFA ?INHALE 2 PUFFS BY MOUTH EVERY 4 HOURS FOR WHEEZE AND SHORTNESS OF BREATH ?What changed:  ?how much to take ?how to take this ?when to take this ?reasons to take this ?additional instructions ?  ?azithromycin 250 MG tablet ?Commonly known as: ZITHROMAX ?Take 1 tablet (250 mg total) by mouth daily for 3 days. ?  ?diclofenac Sodium 1 % Gel ?Commonly known as: Voltaren ?Apply 2 g topically 4 (four) times daily. ?What changed:  ?when to take this ?reasons to take this ?  ?Hydrocortisone Max St 1 % ?Generic drug: hydrocortisone cream ?Apply topically 2 (two) times daily. ?  ?hydrOXYzine 10 MG tablet ?Commonly known as: ATARAX ?TAKE 1 TABLET(10 MG) BY MOUTH EVERY 8 HOURS AS NEEDED FOR ANXIETY ?What changed: See the new instructions. ?  ?ibuprofen 200 MG tablet ?Commonly known as: ADVIL ?Take 200 mg by mouth every 6 (six) hours as needed for mild pain or headache. ?  ?ipratropium-albuterol 0.5-2.5 (3) MG/3ML Soln ?Commonly known as: DUONEB ?Take 3 mLs by nebulization every 6 (six) hours as needed. ?What changed: reasons to take this ?  ?metoprolol tartrate 25 MG tablet ?Commonly known as: LOPRESSOR ?TAKE 1 TABLET(25 MG) BY MOUTH TWICE DAILY ?What changed: See the new instructions. ?  ?nitroGLYCERIN 0.4 MG SL tablet ?Commonly known as: NITROSTAT ?Place 1 tablet (0.4 mg total) under the tongue every 5 (five) minutes as needed for chest pain. ?  ?oxymetazoline 0.05 % nasal spray ?Commonly known as: AFRIN ?Place 1 spray into both nostrils 2 (two) times daily as needed for congestion (nose bleeds). ?  ?predniSONE 50 MG tablet ?Commonly known as: DELTASONE ?Take 1 tablet (50mg ) by mouth once daily ?What changed:  ?medication strength ?how much to take ?how to take this ?when to take this ?additional instructions ?  ?predniSONE 10 MG tablet ?Commonly known as:  DELTASONE ?Take 4 tabs (40 mg total) by mouth daily with breakfast for 1 day, THEN 3 tabs daily with breakfast for 1 day, THEN 2 tabs daily with breakfast for 1 day, THEN 1 tab daily with breakfast for 1 day. ?Start taking on: January 17, 2022 ?What changed: You were already taking a medication with the same name, and this prescription was added. Make sure you understand how and when to take each. ?  ?rosuvastatin 40 MG tablet ?Commonly known as: CRESTOR ?Take 40 mg by mouth daily. ?  ?Trelegy Ellipta 100-62.5-25 MCG/ACT Aepb ?Generic drug: Fluticasone-Umeclidin-Vilant ?Inhale 1 puff into the lungs daily. ?  ? ?  ? ? ?Discharge Instructions: Please refer to Patient Instructions section of EMR for full details.  Patient was counseled important signs and symptoms that should prompt return to medical care, changes in medications, dietary instructions, activity restrictions, and follow  up appointments.  ? ?Follow-Up Appointments: ? Follow-up Information   ? ? [facility] Follow up.   ?Specialty: Home Health Services ?Why: HHPT,HHOT Agency will call you for apt times ?Contact information: ?[address] ?[address] ?[place] [address] ?[phone] ? ? ?  ?  ? ? Martinique, Peter M, MD .   ?Specialty: Cardiology ?Contact information: ?[address] ?[address] ?[place] [address] ?[phone] ? ? ?  ?  ? ? Brimage, Ronnette Juniper, DO. Go on 01/17/2022.   ?Specialty: Family Medicine ?Why: Appointment time is 9:30 am ?Contact information: ?[address] [place] [address] ?[phone] ? ? ?  ?  ? ? Martinique, Peter M, MD .   ?Specialty: Cardiology ?Contact information: ?[address] ?[address] ?[place] [address] ?[phone] ? ? ?  ?  ? ?  ?  ? ?  ? ? ?Precious Gilding, DO ?01/11/2022, 4:40 PM ?PGY-1, [facility] ? ?

## 2022-01-11 NOTE — Progress Notes (Signed)
Mobility Specialist Progress Note: ? ? 01/11/22 1150  ?Mobility  ?Activity Ambulated with assistance in hallway  ?Level of Assistance Contact guard assist, steadying assist  ?Assistive Device None  ?Distance Ambulated (ft) 100 ft  ?Activity Response Tolerated well  ?$Mobility charge 1 Mobility  ? ?Pt agreeable to mobility session. Required CGA throughout for steadying. Pt asx during ambulation. Required up to 4LO2 at EOS to maintain SpO2 >89%. Pt left sitting EOB with all needs met. Ambulatory sat note to follow. ? ?Nelta Numbers ?Acute Rehab ?Phone: [phone] ?Office Phone: [phone] ? ?

## 2022-01-11 NOTE — Progress Notes (Signed)
SATURATION QUALIFICATIONS: (This note is used to comply with regulatory documentation for home oxygen) ? ?Patient Saturations on Room Air at Rest = 94% ? ?Patient Saturations on Room Air while Ambulating = 86% ? ?Patient Saturations on 3 Liters of oxygen while Ambulating = 88% ? ?Patient Saturations on 4 Liters of oxygen while Ambulating = 93% ? ?Please briefly explain why patient needs home oxygen: ? ?Lucas [name] ?Acute Rehab ?Phone: [phone] ?Office Phone: [phone] ? ?

## 2022-01-11 NOTE — Social Work (Signed)
CSW spoke with pt about getting transportation to his Dr. Thomasene Lot when discharged. CSW instructed pt to call safe transport before his appointments and schedule pick up. Pt will transport home by taxi from [facility]. ?

## 2022-01-11 NOTE — Progress Notes (Signed)
Family Medicine Teaching Service ?Daily Progress Note ?Intern Pager: [phone] ? ?Patient name: Lucas [name] Medical record number: [id number] ?Date of birth: 01-28-1951 Age: 71 y.o. Gender: male ? ?Primary Care Provider: [name], DO ?Consultants: Pulmonology ?Code Status: full ? ?Pt Overview and Major Events to Date:  ?01/07/22-admitted ? ?Assessment and Plan: ?Lucas [name] is a 71 year old male presenting with shortness of breath and hypoxemia likely due to COPD exacerbation.  PMH significant for COPD, HLD, HTN, A-fib, history of squamous cell carcinoma, alcohol use, tobacco use ? ?Hypoxemia, resolved  acute on chronic COPD ?Patient was admitted due to SOB likely secondary to COPD after recent hospitalization for COPD exacerbation.  Patient now on home oxygen of 3 L nasal cannula and breathing comfortably.  Patient was seen by his pulmonology yesterday due to prolonged symptoms, continued dyspnea and feeling like he was not yet ready to go home.  The recommendations included smoking cessation, a prolonged steroid course, a course of azithromycin and follow-up outpatient at the pulmonary clinic. ?-Pulmonology consulted, appreciate recommendations ?-Azithromycin 500 mg for 1 day followed by 250 mg for 4 days  ?-Continue prednisone 50 mg daily with 5 more days at discharge followed by taper ?-Keep O2 sat greater than 88% ?-Breo Ellipta and Incruse Ellipta daily ?-Albuterol every 2 hours as needed ?-Ambulatory pulse ox on home oxygen ? ?Anxiety chronic and uncontrolled ?Home medication includes Atarax 10 mg 3 times daily ?-Continue Atarax ?-Continue SSRI outpatient ? ?A-fib  CAD  CABG x4, stable ?-Continuous cardiac monitoring ?-Continue home metoprolol and Crestor ? ?History of RUL squamous cell carcinoma ?Patient follows with oncology ?-Follow-up outpatient ? ?HTN ?-Continue home metoprolol 25 mg twice daily ? ?HLD ?-Continue home Crestor ? ? ?FEN/GI: Heart healthy ?PPx: Lovenox-patient has refused during his  stay ?Dispo: Home today ? ?Subjective:  ?Pt states he is feeling well this morning. No SOB at rest but does feel like he is wheezing a little.  ? ?Objective: ?Temp:  [97.3 ?F (36.3 ?C)-98.7 ?F (37.1 ?C)] 98.5 ?F (36.9 ?C) (03/21 0400) ?Pulse Rate:  [65-97] 81 (03/21 0000) ?Resp:  [17-20] 17 (03/21 0400) ?BP: (117-144)/(71-81) 120/71 (03/21 0400) ?SpO2:  [92 %-100 %] 98 % (03/21 0400) ?Physical Exam: ?General: Pt sitting up in bed, watching TV, NAD ?Cardiovascular: RRR, normal S1/S2 ?Respiratory: good air movement, mild rhonchorous sounds at bases with barely audible wheeze ?Abdomen: soft, non distended, non tender ? ? ?Laboratory: ?Recent Labs  ?Lab 01/08/22 ?6270 01/09/22 ?0319 01/10/22 ?3500  ?WBC 4.8 6.0 4.4  ?HGB 11.2* 10.8* 12.0*  ?HCT 33.5* 33.1* 35.6*  ?PLT 125* 149* 171  ? ?Recent Labs  ?Lab 01/07/22 ?2144 01/08/22 ?9381 01/09/22 ?0319 01/10/22 ?8299  ?NA 137 136 134* 136  ?K 4.2 4.5 4.2 4.1  ?CL 101 102 98 99  ?CO2 24 27 29 31   ?BUN 15 15 14 11   ?CREATININE 0.95 0.72 0.70 0.73  ?CALCIUM 9.3 9.0 9.2 9.2  ?PROT 7.0 6.5  --   --   ?BILITOT 0.5 0.4  --   --   ?ALKPHOS 98 85  --   --   ?ALT 64* 63*  --   --   ?AST 160* 94*  --   --   ?GLUCOSE 124* 140* 109* 97  ? ? ? ?Precious Gilding, DO ?01/11/2022, 5:19 AM ?PGY-1, [place] ?[facility] Intern pager: [phone], text pages welcome ? ?

## 2022-01-12 ENCOUNTER — Inpatient Hospital Stay: Payer: Medicare Other

## 2022-01-13 ENCOUNTER — Telehealth: Payer: Self-pay | Admitting: *Deleted

## 2022-01-13 DIAGNOSIS — Z951 Presence of aortocoronary bypass graft: Secondary | ICD-10-CM | POA: Diagnosis not present

## 2022-01-13 DIAGNOSIS — K7460 Unspecified cirrhosis of liver: Secondary | ICD-10-CM | POA: Diagnosis not present

## 2022-01-13 DIAGNOSIS — G629 Polyneuropathy, unspecified: Secondary | ICD-10-CM | POA: Diagnosis not present

## 2022-01-13 DIAGNOSIS — Z85118 Personal history of other malignant neoplasm of bronchus and lung: Secondary | ICD-10-CM | POA: Diagnosis not present

## 2022-01-13 DIAGNOSIS — J9620 Acute and chronic respiratory failure, unspecified whether with hypoxia or hypercapnia: Secondary | ICD-10-CM | POA: Diagnosis not present

## 2022-01-13 DIAGNOSIS — F17210 Nicotine dependence, cigarettes, uncomplicated: Secondary | ICD-10-CM | POA: Diagnosis not present

## 2022-01-13 DIAGNOSIS — J302 Other seasonal allergic rhinitis: Secondary | ICD-10-CM | POA: Diagnosis not present

## 2022-01-13 DIAGNOSIS — Z7951 Long term (current) use of inhaled steroids: Secondary | ICD-10-CM | POA: Diagnosis not present

## 2022-01-13 DIAGNOSIS — I2510 Atherosclerotic heart disease of native coronary artery without angina pectoris: Secondary | ICD-10-CM | POA: Diagnosis not present

## 2022-01-13 DIAGNOSIS — E785 Hyperlipidemia, unspecified: Secondary | ICD-10-CM | POA: Diagnosis not present

## 2022-01-13 DIAGNOSIS — E46 Unspecified protein-calorie malnutrition: Secondary | ICD-10-CM | POA: Diagnosis not present

## 2022-01-13 DIAGNOSIS — Z9981 Dependence on supplemental oxygen: Secondary | ICD-10-CM | POA: Diagnosis not present

## 2022-01-13 DIAGNOSIS — Z9181 History of falling: Secondary | ICD-10-CM | POA: Diagnosis not present

## 2022-01-13 DIAGNOSIS — I480 Paroxysmal atrial fibrillation: Secondary | ICD-10-CM | POA: Diagnosis not present

## 2022-01-13 DIAGNOSIS — J441 Chronic obstructive pulmonary disease with (acute) exacerbation: Secondary | ICD-10-CM | POA: Diagnosis not present

## 2022-01-13 NOTE — Chronic Care Management (AMB) (Signed)
?  Care Management  ? ?Please note that the patient has been referred for care management services but has not had a face to face visit with the primary care provider in the last 12 month period. This patient has been referred to the appropriate practice personnel for assistance with scheduling a PCP appointment. ?

## 2022-01-13 NOTE — Telephone Encounter (Signed)
Received call from [facility] physical therapist Anda Kraft, saying that patient inadvertently took too much prednisone. ?He was discharged with 5 days of 50mg  daily, followed by 4 day taper of 40mg , 30mg , 20mg , and 10mg  (dispensed in 10mg  pills for the last 4 days) ? ?He took 50mg  + 40mg  =90mg  yesterday ?He took 50mg  + 30mg =80mg  today ? ?Has three 50mg  pills left, and 3 10mg  pills left. ? ?Advised he do the following: ? ?Friday (tomorrow) 50mg  whole pill ?Saturday 25mg  (half of 50mg  pill) + 10mg  = 35mg  ?Sun 25+10 = 35mg  ?Mon 25 ?Tues 25 ?Wed 10 ? ?Then he will be done. This will allow a taper with the number of pills he has left. ?Anda Kraft is going to get this all set up for him so it will be easy to follow ? ?Leeanne Rio, MD  ?

## 2022-01-13 NOTE — Telephone Encounter (Signed)
Lucas [name] from [facility] calling for PT verbal orders as follows: ? ?1 time(s) weekly for 8 week(s) ? ?Verbal orders given per [facility] protocol ? ?Christen Bame, CMA ? ?

## 2022-01-15 ENCOUNTER — Inpatient Hospital Stay (HOSPITAL_COMMUNITY): Payer: Medicare Other

## 2022-01-15 ENCOUNTER — Emergency Department (HOSPITAL_COMMUNITY): Payer: Medicare Other

## 2022-01-15 ENCOUNTER — Inpatient Hospital Stay (HOSPITAL_COMMUNITY)
Admission: EM | Admit: 2022-01-15 | Discharge: 2022-01-20 | DRG: 208 | Disposition: A | Discharge status: Home Health | Payer: Medicare Other | Attending: Internal Medicine | Admitting: Internal Medicine

## 2022-01-15 DIAGNOSIS — I480 Paroxysmal atrial fibrillation: Secondary | ICD-10-CM | POA: Diagnosis present

## 2022-01-15 DIAGNOSIS — R0602 Shortness of breath: Secondary | ICD-10-CM | POA: Diagnosis present

## 2022-01-15 DIAGNOSIS — R0600 Dyspnea, unspecified: Secondary | ICD-10-CM | POA: Diagnosis not present

## 2022-01-15 DIAGNOSIS — I9589 Other hypotension: Secondary | ICD-10-CM | POA: Diagnosis not present

## 2022-01-15 DIAGNOSIS — J9622 Acute and chronic respiratory failure with hypercapnia: Secondary | ICD-10-CM | POA: Diagnosis present

## 2022-01-15 DIAGNOSIS — Z885 Allergy status to narcotic agent status: Secondary | ICD-10-CM

## 2022-01-15 DIAGNOSIS — E8770 Fluid overload, unspecified: Secondary | ICD-10-CM | POA: Diagnosis present

## 2022-01-15 DIAGNOSIS — R339 Retention of urine, unspecified: Secondary | ICD-10-CM | POA: Diagnosis present

## 2022-01-15 DIAGNOSIS — R739 Hyperglycemia, unspecified: Secondary | ICD-10-CM | POA: Diagnosis present

## 2022-01-15 DIAGNOSIS — Z20822 Contact with and (suspected) exposure to covid-19: Secondary | ICD-10-CM | POA: Diagnosis present

## 2022-01-15 DIAGNOSIS — I517 Cardiomegaly: Secondary | ICD-10-CM | POA: Diagnosis not present

## 2022-01-15 DIAGNOSIS — R918 Other nonspecific abnormal finding of lung field: Secondary | ICD-10-CM | POA: Diagnosis not present

## 2022-01-15 DIAGNOSIS — L89152 Pressure ulcer of sacral region, stage 2: Secondary | ICD-10-CM | POA: Diagnosis not present

## 2022-01-15 DIAGNOSIS — J9621 Acute and chronic respiratory failure with hypoxia: Secondary | ICD-10-CM | POA: Diagnosis not present

## 2022-01-15 DIAGNOSIS — G931 Anoxic brain damage, not elsewhere classified: Secondary | ICD-10-CM | POA: Diagnosis present

## 2022-01-15 DIAGNOSIS — J9620 Acute and chronic respiratory failure, unspecified whether with hypoxia or hypercapnia: Secondary | ICD-10-CM

## 2022-01-15 DIAGNOSIS — B888 Other specified infestations: Secondary | ICD-10-CM | POA: Diagnosis present

## 2022-01-15 DIAGNOSIS — Z951 Presence of aortocoronary bypass graft: Secondary | ICD-10-CM

## 2022-01-15 DIAGNOSIS — J449 Chronic obstructive pulmonary disease, unspecified: Secondary | ICD-10-CM

## 2022-01-15 DIAGNOSIS — Z7952 Long term (current) use of systemic steroids: Secondary | ICD-10-CM

## 2022-01-15 DIAGNOSIS — I2510 Atherosclerotic heart disease of native coronary artery without angina pectoris: Secondary | ICD-10-CM | POA: Diagnosis not present

## 2022-01-15 DIAGNOSIS — Z79899 Other long term (current) drug therapy: Secondary | ICD-10-CM

## 2022-01-15 DIAGNOSIS — R6889 Other general symptoms and signs: Secondary | ICD-10-CM | POA: Diagnosis not present

## 2022-01-15 DIAGNOSIS — J441 Chronic obstructive pulmonary disease with (acute) exacerbation: Secondary | ICD-10-CM | POA: Diagnosis not present

## 2022-01-15 DIAGNOSIS — Z923 Personal history of irradiation: Secondary | ICD-10-CM | POA: Diagnosis not present

## 2022-01-15 DIAGNOSIS — R404 Transient alteration of awareness: Secondary | ICD-10-CM | POA: Diagnosis not present

## 2022-01-15 DIAGNOSIS — F17210 Nicotine dependence, cigarettes, uncomplicated: Secondary | ICD-10-CM | POA: Diagnosis not present

## 2022-01-15 DIAGNOSIS — Z880 Allergy status to penicillin: Secondary | ICD-10-CM

## 2022-01-15 DIAGNOSIS — L8990 Pressure ulcer of unspecified site, unspecified stage: Secondary | ICD-10-CM | POA: Insufficient documentation

## 2022-01-15 DIAGNOSIS — I499 Cardiac arrhythmia, unspecified: Secondary | ICD-10-CM | POA: Diagnosis not present

## 2022-01-15 DIAGNOSIS — I10 Essential (primary) hypertension: Secondary | ICD-10-CM | POA: Diagnosis not present

## 2022-01-15 DIAGNOSIS — G9341 Metabolic encephalopathy: Secondary | ICD-10-CM | POA: Diagnosis present

## 2022-01-15 DIAGNOSIS — E861 Hypovolemia: Secondary | ICD-10-CM | POA: Diagnosis not present

## 2022-01-15 DIAGNOSIS — I700 Atherosclerosis of aorta: Secondary | ICD-10-CM | POA: Diagnosis not present

## 2022-01-15 DIAGNOSIS — Z9981 Dependence on supplemental oxygen: Secondary | ICD-10-CM

## 2022-01-15 DIAGNOSIS — E785 Hyperlipidemia, unspecified: Secondary | ICD-10-CM | POA: Diagnosis present

## 2022-01-15 DIAGNOSIS — Z85118 Personal history of other malignant neoplasm of bronchus and lung: Secondary | ICD-10-CM | POA: Diagnosis not present

## 2022-01-15 DIAGNOSIS — Z743 Need for continuous supervision: Secondary | ICD-10-CM | POA: Diagnosis not present

## 2022-01-15 DIAGNOSIS — T380X5A Adverse effect of glucocorticoids and synthetic analogues, initial encounter: Secondary | ICD-10-CM | POA: Diagnosis present

## 2022-01-15 DIAGNOSIS — Z825 Family history of asthma and other chronic lower respiratory diseases: Secondary | ICD-10-CM

## 2022-01-15 DIAGNOSIS — Z9889 Other specified postprocedural states: Secondary | ICD-10-CM | POA: Diagnosis not present

## 2022-01-15 DIAGNOSIS — Z888 Allergy status to other drugs, medicaments and biological substances status: Secondary | ICD-10-CM

## 2022-01-15 DIAGNOSIS — Z8719 Personal history of other diseases of the digestive system: Secondary | ICD-10-CM | POA: Diagnosis not present

## 2022-01-15 DIAGNOSIS — K7460 Unspecified cirrhosis of liver: Secondary | ICD-10-CM | POA: Diagnosis not present

## 2022-01-15 DIAGNOSIS — F419 Anxiety disorder, unspecified: Secondary | ICD-10-CM | POA: Diagnosis present

## 2022-01-15 DIAGNOSIS — Z4682 Encounter for fitting and adjustment of non-vascular catheter: Secondary | ICD-10-CM | POA: Diagnosis not present

## 2022-01-15 DIAGNOSIS — R4020 Unspecified coma: Secondary | ICD-10-CM | POA: Diagnosis not present

## 2022-01-15 DIAGNOSIS — Z886 Allergy status to analgesic agent status: Secondary | ICD-10-CM

## 2022-01-15 DIAGNOSIS — Z7951 Long term (current) use of inhaled steroids: Secondary | ICD-10-CM

## 2022-01-15 DIAGNOSIS — J9690 Respiratory failure, unspecified, unspecified whether with hypoxia or hypercapnia: Secondary | ICD-10-CM | POA: Diagnosis not present

## 2022-01-15 LAB — I-STAT ARTERIAL BLOOD GAS, ED
Acid-Base Excess: 6 mmol/L — ABNORMAL HIGH (ref 0.0–2.0)
Bicarbonate: 32.9 mmol/L — ABNORMAL HIGH (ref 20.0–28.0)
Calcium, Ion: 1.24 mmol/L (ref 1.15–1.40)
HCT: 36 % — ABNORMAL LOW (ref 39.0–52.0)
Hemoglobin: 12.2 g/dL — ABNORMAL LOW (ref 13.0–17.0)
O2 Saturation: 100 %
Patient temperature: 98.6
Potassium: 3.9 mmol/L (ref 3.5–5.1)
Sodium: 135 mmol/L (ref 135–145)
TCO2: 35 mmol/L — ABNORMAL HIGH (ref 22–32)
pCO2 arterial: 55.7 mmHg — ABNORMAL HIGH (ref 32–48)
pH, Arterial: 7.379 (ref 7.35–7.45)
pO2, Arterial: 538 mmHg — ABNORMAL HIGH (ref 83–108)

## 2022-01-15 LAB — AMMONIA: Ammonia: 61 umol/L — ABNORMAL HIGH (ref 9–35)

## 2022-01-15 LAB — COMPREHENSIVE METABOLIC PANEL
ALT: 100 U/L — ABNORMAL HIGH (ref 0–44)
AST: 141 U/L — ABNORMAL HIGH (ref 15–41)
Albumin: 3.5 g/dL (ref 3.5–5.0)
Alkaline Phosphatase: 77 U/L (ref 38–126)
Anion gap: 5 (ref 5–15)
BUN: 13 mg/dL (ref 8–23)
CO2: 29 mmol/L (ref 22–32)
Calcium: 9 mg/dL (ref 8.9–10.3)
Chloride: 103 mmol/L (ref 98–111)
Creatinine, Ser: 0.91 mg/dL (ref 0.61–1.24)
GFR, Estimated: >60 mL/min (ref >60)
Glucose, Bld: 187 mg/dL — ABNORMAL HIGH (ref 70–99)
Potassium: 3.9 mmol/L (ref 3.5–5.1)
Sodium: 137 mmol/L (ref 135–145)
Total Bilirubin: 0.7 mg/dL (ref 0.3–1.2)
Total Protein: 6.6 g/dL (ref 6.5–8.1)

## 2022-01-15 LAB — BRAIN NATRIURETIC PEPTIDE: B Natriuretic Peptide: 246.7 pg/mL — ABNORMAL HIGH (ref 0.0–100.0)

## 2022-01-15 LAB — TROPONIN I (HIGH SENSITIVITY)
Troponin I (High Sensitivity): 58 ng/L — ABNORMAL HIGH (ref <18)
Troponin I (High Sensitivity): 84 ng/L — ABNORMAL HIGH (ref <18)

## 2022-01-15 LAB — CBC
HCT: 38.6 % — ABNORMAL LOW (ref 39.0–52.0)
Hemoglobin: 11.9 g/dL — ABNORMAL LOW (ref 13.0–17.0)
MCH: 31.4 pg (ref 26.0–34.0)
MCHC: 30.8 g/dL (ref 30.0–36.0)
MCV: 101.8 fL — ABNORMAL HIGH (ref 80.0–100.0)
Platelets: 248 10*3/uL (ref 150–400)
RBC: 3.79 MIL/uL — ABNORMAL LOW (ref 4.22–5.81)
RDW: 14.1 % (ref 11.5–15.5)
WBC: 12.1 10*3/uL — ABNORMAL HIGH (ref 4.0–10.5)
nRBC: 0 % (ref 0.0–0.2)

## 2022-01-15 LAB — RESP PANEL BY RT-PCR (FLU A&B, COVID) ARPGX2
Influenza A by PCR: NEGATIVE
Influenza B by PCR: NEGATIVE
SARS Coronavirus 2 by RT PCR: NEGATIVE

## 2022-01-15 LAB — GLUCOSE, CAPILLARY
Glucose-Capillary: 95 mg/dL (ref 70–99)
Glucose-Capillary: 144 mg/dL — ABNORMAL HIGH (ref 70–99)
Glucose-Capillary: 110 mg/dL — ABNORMAL HIGH (ref 70–99)

## 2022-01-15 LAB — PROTIME-INR
INR: 1 (ref 0.8–1.2)
Prothrombin Time: 13.1 seconds (ref 11.4–15.2)

## 2022-01-15 LAB — MRSA NEXT GEN BY PCR, NASAL: MRSA by PCR Next Gen: NOT DETECTED

## 2022-01-15 LAB — ETHANOL: Alcohol, Ethyl (B): <10 mg/dL (ref <10)

## 2022-01-15 LAB — CBG MONITORING, ED: Glucose-Capillary: 112 mg/dL — ABNORMAL HIGH (ref 70–99)

## 2022-01-15 MED ORDER — ORAL CARE MOUTH RINSE
15.0000 mL | OROMUCOSAL | Status: DC
  Administered 2022-01-15 – 2022-01-17 (×21): 15 mL via OROMUCOSAL

## 2022-01-15 MED ORDER — METHYLPREDNISOLONE SODIUM SUCC 125 MG IJ SOLR
80.0000 mg | INTRAMUSCULAR | Status: DC
Start: 2022-01-15 — End: 2022-01-17
  Administered 2022-01-16 – 2022-01-17 (×2): 80 mg via INTRAVENOUS
  Filled 2022-01-15 (×3): qty 2

## 2022-01-15 MED ORDER — DOCUSATE SODIUM 50 MG/5ML PO LIQD
100.0000 mg | Freq: Two times a day (BID) | ORAL | Status: DC
  Administered 2022-01-15 – 2022-01-17 (×5): 100 mg
  Filled 2022-01-15 (×5): qty 10

## 2022-01-15 MED ORDER — REVEFENACIN 175 MCG/3ML IN SOLN
175.0000 ug | Freq: Every day | RESPIRATORY_TRACT | Status: DC
  Administered 2022-01-16 – 2022-01-20 (×5): 175 ug via RESPIRATORY_TRACT
  Filled 2022-01-15 (×9): qty 3

## 2022-01-15 MED ORDER — PANTOPRAZOLE 2 MG/ML SUSPENSION
40.0000 mg | Freq: Every day | ORAL | Status: DC
  Administered 2022-01-15 – 2022-01-17 (×3): 40 mg
  Filled 2022-01-15 (×3): qty 20

## 2022-01-15 MED ORDER — ENOXAPARIN SODIUM 40 MG/0.4ML IJ SOSY
40.0000 mg | PREFILLED_SYRINGE | INTRAMUSCULAR | Status: DC
Start: 2022-01-15 — End: 2022-01-20
  Administered 2022-01-15 – 2022-01-17 (×3): 40 mg via SUBCUTANEOUS
  Filled 2022-01-15 (×5): qty 0.4

## 2022-01-15 MED ORDER — INSULIN ASPART 100 UNIT/ML IJ SOLN
0.0000 [IU] | INTRAMUSCULAR | Status: DC
  Administered 2022-01-15 – 2022-01-17 (×3): 2 [IU] via SUBCUTANEOUS

## 2022-01-15 MED ORDER — ROSUVASTATIN CALCIUM 20 MG PO TABS
40.0000 mg | ORAL_TABLET | Freq: Every day | ORAL | Status: DC
  Administered 2022-01-15 – 2022-01-17 (×3): 40 mg
  Filled 2022-01-15 (×3): qty 2

## 2022-01-15 MED ORDER — LEVOFLOXACIN IN D5W 500 MG/100ML IV SOLN
500.0000 mg | INTRAVENOUS | Status: DC
  Administered 2022-01-15 – 2022-01-17 (×3): 500 mg via INTRAVENOUS
  Filled 2022-01-15 (×3): qty 100

## 2022-01-15 MED ORDER — BUDESONIDE 0.5 MG/2ML IN SUSP
0.5000 mg | Freq: Two times a day (BID) | RESPIRATORY_TRACT | Status: DC
Start: 2022-01-15 — End: 2022-01-20
  Administered 2022-01-15 – 2022-01-20 (×10): 0.5 mg via RESPIRATORY_TRACT
  Filled 2022-01-15 (×10): qty 2

## 2022-01-15 MED ORDER — ALBUTEROL SULFATE (2.5 MG/3ML) 0.083% IN NEBU
2.5000 mg | INHALATION_SOLUTION | RESPIRATORY_TRACT | Status: DC | PRN
Start: 2022-01-15 — End: 2022-01-20

## 2022-01-15 MED ORDER — ARFORMOTEROL TARTRATE 15 MCG/2ML IN NEBU
15.0000 ug | INHALATION_SOLUTION | Freq: Two times a day (BID) | RESPIRATORY_TRACT | Status: DC
  Administered 2022-01-15 – 2022-01-20 (×10): 15 ug via RESPIRATORY_TRACT
  Filled 2022-01-15 (×13): qty 2

## 2022-01-15 MED ORDER — SODIUM CHLORIDE 0.9 % IV SOLN
1000.0000 mL | INTRAVENOUS | Status: DC
Start: 2022-01-15 — End: 2022-01-15

## 2022-01-15 MED ORDER — ETOMIDATE 2 MG/ML IV SOLN
INTRAVENOUS | Status: DC | PRN
  Administered 2022-01-15: 20 mg via INTRAVENOUS

## 2022-01-15 MED ORDER — SODIUM CHLORIDE 0.9 % IV SOLN: INTRAVENOUS | Status: DC

## 2022-01-15 MED ORDER — PROPOFOL 1000 MG/100ML IV EMUL
0.0000 ug/kg/min | INTRAVENOUS | Status: DC
  Administered 2022-01-15: 40 ug/kg/min via INTRAVENOUS
  Administered 2022-01-15: 35 ug/kg/min via INTRAVENOUS
  Administered 2022-01-15: 40 ug/kg/min via INTRAVENOUS
  Administered 2022-01-16: 25 ug/kg/min via INTRAVENOUS
  Administered 2022-01-16 (×2): 30 ug/kg/min via INTRAVENOUS
  Administered 2022-01-17: 25 ug/kg/min via INTRAVENOUS
  Filled 2022-01-15 (×8): qty 100

## 2022-01-15 MED ORDER — MIDAZOLAM HCL 2 MG/2ML IJ SOLN
1.0000 mg | INTRAMUSCULAR | Status: DC | PRN
  Administered 2022-01-16 (×2): 1 mg via INTRAVENOUS
  Filled 2022-01-15 (×2): qty 2

## 2022-01-15 MED ORDER — CHLORHEXIDINE GLUCONATE CLOTH 2 % EX PADS
6.0000 | MEDICATED_PAD | Freq: Every day | CUTANEOUS | Status: DC
  Administered 2022-01-16 – 2022-01-20 (×4): 6 via TOPICAL

## 2022-01-15 MED ORDER — ALBUTEROL SULFATE (2.5 MG/3ML) 0.083% IN NEBU
5.0000 mg | INHALATION_SOLUTION | Freq: Once | RESPIRATORY_TRACT | Status: AC
  Administered 2022-01-15: 5 mg via RESPIRATORY_TRACT

## 2022-01-15 MED ORDER — PROPOFOL 1000 MG/100ML IV EMUL
5.0000 ug/kg/min | INTRAVENOUS | Status: DC
  Administered 2022-01-15: 5 ug/kg/min via INTRAVENOUS
  Filled 2022-01-15: qty 100

## 2022-01-15 MED ORDER — SODIUM CHLORIDE 0.9 % IV BOLUS (SEPSIS)
1000.0000 mL | Freq: Once | INTRAVENOUS | Status: AC
  Administered 2022-01-15: 1000 mL via INTRAVENOUS

## 2022-01-15 MED ORDER — FENTANYL 2500MCG IN NS 250ML (10MCG/ML) PREMIX INFUSION
0.0000 ug/h | INTRAVENOUS | Status: DC
  Administered 2022-01-15: 50 ug/h via INTRAVENOUS
  Administered 2022-01-16 (×2): 150 ug/h via INTRAVENOUS
  Filled 2022-01-15 (×3): qty 250

## 2022-01-15 MED ORDER — POLYETHYLENE GLYCOL 3350 17 G PO PACK
17.0000 g | PACK | Freq: Every day | ORAL | Status: DC
  Administered 2022-01-15 – 2022-01-17 (×3): 17 g
  Filled 2022-01-15 (×3): qty 1

## 2022-01-15 MED ORDER — ACETAMINOPHEN 325 MG PO TABS: 650.0000 mg | ORAL_TABLET | Freq: Four times a day (QID) | ORAL | Status: DC | PRN

## 2022-01-15 MED ORDER — FENTANYL CITRATE PF 50 MCG/ML IJ SOSY
25.0000 ug | PREFILLED_SYRINGE | INTRAMUSCULAR | Status: DC | PRN
  Administered 2022-01-15 – 2022-01-16 (×3): 50 ug via INTRAVENOUS

## 2022-01-15 MED ORDER — FENTANYL CITRATE (PF) 100 MCG/2ML IJ SOLN
INTRAMUSCULAR | Status: DC | PRN
  Administered 2022-01-15: 100 ug via INTRAVENOUS

## 2022-01-15 MED ORDER — VITAL HIGH PROTEIN PO LIQD
1000.0000 mL | ORAL | Status: DC
  Administered 2022-01-15 – 2022-01-16 (×2): 1000 mL
  Filled 2022-01-15: qty 1000

## 2022-01-15 MED ORDER — CHLORHEXIDINE GLUCONATE 0.12% ORAL RINSE (MEDLINE KIT)
15.0000 mL | Freq: Two times a day (BID) | OROMUCOSAL | Status: DC
  Administered 2022-01-15 – 2022-01-17 (×5): 15 mL via OROMUCOSAL

## 2022-01-15 MED ORDER — ROCURONIUM BROMIDE 50 MG/5ML IV SOLN
INTRAVENOUS | Status: DC | PRN
  Administered 2022-01-15: 100 mg via INTRAVENOUS

## 2022-01-15 NOTE — H&P (Signed)
? ?NAME:  Lucas [name], MRN:  [id number], DOB:  [birth year], LOS: 0 ?ADMISSION DATE:  01/15/2022, CONSULTATION DATE:  3/25 ?REFERRING MD:  Dr. Tomi Bamberger, CHIEF COMPLAINT:  SOB   ? ?History of Present Illness:  ?71 yo male smoker with COPD and chronic respiratory failure on 3 liters called EMS with c/o dyspnea.  SpO2 in 70's on room air and diaphoretic.  Developed AMS.  Intubated in ER.  Noted to have bed bug infestation.  Had admission earlier this month for the same.  Started on therapy for COPD exacerbation. ? ?Pertinent  Medical History  ?HTN, HLD, ETOH, Stage 1A NSCLC (Squamous cell) s/p XRT ? ?Significant Hospital Events: ?Including procedures, antibiotic start and stop dates in addition to other pertinent events   ?3/25 Admit, VDRF, possible aspiration, start ABx ? ?Interim History / Subjective:  ?Sedated, on vent in ER. ? ?Objective   ?Blood pressure 101/64, pulse 76, resp. rate 20, height 5\' 11"  (1.803 m), SpO2 100 %. ?   ?Vent Mode: PRVC ?FiO2 (%):  [40 %-100 %] 40 % ?Set Rate:  [20 bmp] 20 bmp ?Vt Set:  [20 mL-550 mL] 550 mL ?PEEP:  [5 cmH20] 5 cmH20 ?Plateau Pressure:  [22 cmH20] 22 cmH20  ?No intake or output data in the 24 hours ending 01/15/22 1118 ?There were no vitals filed for this visit. ? ?Examination: ? ?General - sedated ?Eyes - pupils reactive ?ENT - ETT in place ?Cardiac - regular rate/rhythm, no murmur ?Chest - decreased breath sounds, Lt basilar rales ?Abdomen - soft, non tender, + bowel sounds ?Extremities - no cyanosis, clubbing, or edema ?Skin - no rashes ?Neuro - RASS -3 ? ? ?Resolved Hospital Problem list   ? ? ?Assessment & Plan:  ? ?Acute on chronic respiratory failure with hypoxia and hypercapnia from COPD exacerbation and possible aspiration pneumonitis. ?Tobacco abuse. ?- full vent support ?- adjust vent settings to avoid PEEPi ?- f/u CXR ?- goal SpO2 > 90% ?- solumedrol 40 mg q12h ?- start levaquin ?- yupelri, brovana, pulmicort ?- prn albuterol ? ?Hypotension from hypovolemia and  sedation. ?- continue IV fluids ? ?Acute metabolic encephalopathy from hypoxia/hypercapnia. ?Hx of anxiety. ?- RASS goal 0 to -1 ?- f/u CT head 3/25 ? ?Steroid induced hyperglycemia. ?- SSI ? ?Bed bug infestation. ?- contact isolation ? ?Hx of HTN, HLD, PAF, CAD s/p CABG. ?- continue crestor ?- hold outpt lopressor ? ?Best Practice (right click and "Reselect all SmartList Selections" daily)  ?Diet/type: tubefeeds ?DVT prophylaxis: LMWH ?GI prophylaxis: PPI ?Lines: N/A ?Foley:  N/A ?Code Status:  full code ?Last date of multidisciplinary goals of care discussion [x]  ? ?Labs   ?CBC: ?Recent Labs  ?Lab 01/09/22 ?0319 01/10/22 ?3244 01/15/22 ?0102 01/15/22 ?1000  ?WBC 6.0 4.4 12.1*  --   ?HGB 10.8* 12.0* 11.9* 12.2*  ?HCT 33.1* 35.6* 38.6* 36.0*  ?MCV 95.4 95.4 101.8*  --   ?PLT 149* 171 248  --   ? ? ?Basic Metabolic Panel: ?Recent Labs  ?Lab 01/09/22 ?0319 01/10/22 ?7253 01/15/22 ?6644 01/15/22 ?1000  ?NA 134* 136 137 135  ?K 4.2 4.1 3.9 3.9  ?CL 98 99 103  --   ?CO2 29 31 29   --   ?GLUCOSE 109* 97 187*  --   ?BUN 14 11 13   --   ?CREATININE 0.70 0.73 0.91  --   ?CALCIUM 9.2 9.2 9.0  --   ? ?GFR: ?Estimated Creatinine Clearance: 68.3 mL/min (by C-G formula based on SCr of 0.91 mg/dL). ?Recent Labs  ?  Lab 01/09/22 ?0319 01/10/22 ?8242 01/15/22 ?0844  ?WBC 6.0 4.4 12.1*  ? ? ?Liver Function Tests: ?Recent Labs  ?Lab 01/15/22 ?0844  ?AST 141*  ?ALT 100*  ?ALKPHOS 77  ?BILITOT 0.7  ?PROT 6.6  ?ALBUMIN 3.5  ? ?No results for input(s): LIPASE, AMYLASE in the last 168 hours. ?Recent Labs  ?Lab 01/15/22 ?3536  ?AMMONIA 61*  ? ? ?ABG ?   ?Component Value Date/Time  ? PHART 7.379 01/15/2022 1000  ? PCO2ART 55.7 (H) 01/15/2022 1000  ? PO2ART 538 (H) 01/15/2022 1000  ? HCO3 32.9 (H) 01/15/2022 1000  ? TCO2 35 (H) 01/15/2022 1000  ? ACIDBASEDEF 0.3 09/07/2020 0442  ? O2SAT 100 01/15/2022 1000  ?  ? ?Coagulation Profile: ?Recent Labs  ?Lab 01/15/22 ?0844  ?INR 1.0  ? ? ?Cardiac Enzymes: ?No results for input(s): CKTOTAL, CKMB,  CKMBINDEX, TROPONINI in the last 168 hours. ? ?HbA1C: ?Hgb A1c MFr Bld  ?Date/Time Value Ref Range Status  ?09/07/2020 04:42 AM 5.5 4.8 - 5.6 % Final  ?  Comment:  ?  (NOTE) ?Pre diabetes:          5.7%-6.4% ? ?Diabetes:              >6.4% ? ?Glycemic control for   <7.0% ?adults with diabetes ?  ?05/02/2020 12:28 PM 5.5 4.8 - 5.6 % Final  ?  Comment:  ?  (NOTE) ?Pre diabetes:          5.7%-6.4% ? ?Diabetes:              >6.4% ? ?Glycemic control for   <7.0% ?adults with diabetes ?  ? ? ?CBG: ?No results for input(s): GLUCAP in the last 168 hours. ? ?Review of Systems:   ?Unable to obtain ? ?Past Medical History:  ?He,  has a past medical history of Acute blood loss anemia (06/21/2018), Acute upper GI bleed (06/21/2018), Allergy, Bilateral AVN of femurs ([place]) (06/25/2018), COPD (12/21/2006), Environmental allergies, History of femur fracture (06/25/2018), History of Fracture of right tibia (06/26/2018), History of multiple right thoacic rib fractures (06/25/2018), History of Right clavicular fracture (06/25/2018), HYPERLIPIDEMIA (12/21/2006), HYPERTENSION, BENIGN SYSTEMIC (12/21/2006), Mallory-Weiss tear, and TOBACCO DEPENDENCE (12/21/2006).  ? ?Surgical History:  ? ?Past Surgical History:  ?Procedure Laterality Date  ? ANKLE SURGERY    ? left ankle  ? APPLICATION OF WOUND VAC  01/06/2012  ? Procedure: APPLICATION OF WOUND VAC;  Surgeon: Meredith Pel, MD;  Location: [facility];  Service: Orthopedics;  Laterality: Left;  ? APPLICATION OF WOUND VAC Right 10/17/2015  ? Procedure: APPLICATION OF WOUND VAC;  Surgeon: Leandrew Koyanagi, MD;  Location: [facility];  Service: Orthopedics;  Laterality: Right;  ? BIOPSY  06/22/2018  ? Procedure: BIOPSY;  Surgeon: Dorothea Ogle v, DO;  Location: [facility];  Service: Gastroenterology;;  ? BRONCHIAL BIOPSY  05/04/2020  ? Procedure: BRONCHIAL BIOPSIES;  Surgeon: Garner Nash, DO;  Location: [facility];  Service: Pulmonary;;  ? BRONCHIAL BRUSHINGS  05/04/2020  ? Procedure: BRONCHIAL BRUSHINGS;   Surgeon: Garner Nash, DO;  Location: [facility];  Service: Pulmonary;;  ? BRONCHIAL NEEDLE ASPIRATION BIOPSY  05/04/2020  ? Procedure: BRONCHIAL NEEDLE ASPIRATION BIOPSIES;  Surgeon: Garner Nash, DO;  Location: [facility];  Service: Pulmonary;;  ? BRONCHIAL WASHINGS  05/04/2020  ? Procedure: BRONCHIAL WASHINGS;  Surgeon: Garner Nash, DO;  Location: [facility];  Service: Pulmonary;;  ? CORONARY ARTERY BYPASS GRAFT N/A 12/16/2019  ? Procedure: CORONARY ARTERY BYPASS GRAFTING (CABG) TIMES 4 USING  LEFT INTERNAL MAMMARY ARTERY AND ENDOSCOPICALLY HARVESTED RIGHT SAPHENOUS VEIN;  Surgeon: Lajuana Matte, MD;  Location: [facility];  Service: Open Heart Surgery;  Laterality: N/A;  ? ESOPHAGOGASTRODUODENOSCOPY (EGD) WITH PROPOFOL N/A 06/22/2018  ? Procedure: ESOPHAGOGASTRODUODENOSCOPY (EGD) WITH PROPOFOL;  Surgeon: [id number] v, DO;  Location: [facility];  Service: Gastroenterology;  Laterality: N/A;  ? EXTERNAL FIXATION LEG  01/06/2012  ? Procedure: EXTERNAL FIXATION LEG;  Surgeon: Meredith Pel, MD;  Location: [facility];  Service: Orthopedics;  Laterality: Left;  Smith-nephew external fixator set  ? EXTERNAL FIXATION LEG Right 09/16/2015  ? Procedure: EXTERNAL FIXATION LEG/LARGE;  Surgeon: Mcarthur Rossetti, MD;  Location: [facility];  Service: Orthopedics;  Laterality: Right;  ? EXTERNAL FIXATION REMOVAL Right 09/22/2015  ? Procedure: REMOVAL EXTERNAL FIXATION LEG;  Surgeon: Mcarthur Rossetti, MD;  Location: [facility];  Service: Orthopedics;  Laterality: Right;  ? FASCIOTOMY  01/10/2012  ? Procedure: FASCIOTOMY;  Surgeon: Rozanna Box, MD;  Location: [facility];  Service: Orthopedics;  Laterality: Left;  status post fasciotomy with wound vac left calf; adjustment external fixator left tibia under fluoro; retention suture/wound vac placement left lateral calf wound  ? FASCIOTOMY  01/06/2012  ? Procedure: FASCIOTOMY;  Surgeon: Meredith Pel, MD;  Location: [facility];  Service: Orthopedics;   Laterality: Left;  Four compartment fasciotomy  ? FIDUCIAL MARKER PLACEMENT  05/04/2020  ? Procedure: FIDUCIAL MARKER PLACEMENT;  Surgeon: Garner Nash, DO;  Location: [facility];  Service: Pulmonary;;  ? HOT HEMOSTASI

## 2022-01-15 NOTE — Plan of Care (Signed)
?  Interdisciplinary Goals of Care Family Meeting ? ? ?Date carried out:: 01/15/2022 ? ?Location of the meeting: Bedside ? ?Member's involved: Physician and Bedside Registered Nurse ? ?Durable Power of Tour manager: Patient's wife.   ? ?Discussion: We discussed goals of care for Lucas [name] .  She thought he had previously said he wouldn't want intubation.  I explained to her that there was no documentation of this, and he was listed as a full code during his last admission from 01/07/22 to 01/11/22.  As such she is agreeable to continue current therapies. ? ?Code status: Full Code ? ?Disposition: Continue current acute care ? ? ?Time spent for the meeting: 14 minutes ? ?Kesia Dalto ?01/15/2022, 2:51 PM ? ?

## 2022-01-15 NOTE — Progress Notes (Signed)
Patient came in [facility] being manually ventilated with BVM, patient intubated by ED physician with a 8.5 ETT taped at 26 cm at the lip good BBS ausculted over lung fields, good color change on ETCO2 detector, SATS 100%, placed on above vent settings per ARDS net protocol. ?

## 2022-01-15 NOTE — ED Provider Notes (Signed)
?[facility] ?Provider Note ? ? ?CSN: [id number] ?Arrival date & time: 01/15/22  4665 ? ?  ? ?History ? ?Chief Complaint  ?Patient presents with  ? Respiratory Distress  ? ? ?Lucas [name] is a 71 y.o. male. ? ?HPI ? ?Patient has a history of COPD, cirrhosis, gastritis, coronary artery disease, lung cancer, A-fib who presents with extreme shortness of breath.  History limited as the patient arrived unresponsive.  EMS reports that they were called out for respiratory difficulties.  When they found the patient he was standing up leaning forward diaphoretic with obvious respiratory distress.  He was not able to provide any history.  Patient became unresponsive and they had to proceed with bag-valve-mask ventilation.  Patient was given epinephrine, magnesium, steroids as well as a DuoNeb treatment.  On arrival in the ED the patient was receiving bag valve mask ventilation.   ? ?Prior records reviewed.  Patient was recently admitted to the hospital on March 17 for COPD exacerbation.  He was discharged on March 21. ? ?Home Medications ?Prior to Admission medications   ?Medication Sig Start Date End Date Taking? Authorizing Provider  ?albuterol (VENTOLIN HFA) 108 (90 Base) MCG/ACT inhaler INHALE 2 PUFFS BY MOUTH EVERY 4 HOURS FOR WHEEZE AND SHORTNESS OF BREATH ?Patient taking differently: Inhale 2 puffs into the lungs every 4 (four) hours as needed for wheezing or shortness of breath. 12/10/21   [name], DO  ?diclofenac Sodium (VOLTAREN) 1 % GEL Apply 2 g topically 4 (four) times daily. ?Patient taking differently: Apply 2 g topically 4 (four) times daily as needed (pain). 12/28/20   [name], DO  ?Fluticasone-Umeclidin-Vilant (TRELEGY ELLIPTA) 100-62.5-25 MCG/ACT AEPB Inhale 1 puff into the lungs daily. 01/05/22   [name], DO  ?hydrocortisone cream 1 % Apply topically 2 (two) times daily. 01/10/22   Shary Key, DO  ?hydrOXYzine (ATARAX) 10 MG tablet TAKE 1  TABLET(10 MG) BY MOUTH EVERY 8 HOURS AS NEEDED FOR ANXIETY ?Patient taking differently: Take 10 mg by mouth every 8 (eight) hours as needed for anxiety. 11/04/21   [name], DO  ?ibuprofen (ADVIL) 200 MG tablet Take 200 mg by mouth every 6 (six) hours as needed for mild pain or headache.    [provider]  ?ipratropium-albuterol (DUONEB) 0.5-2.5 (3) MG/3ML SOLN Take 3 mLs by nebulization every 6 (six) hours as needed. ?Patient taking differently: Take 3 mLs by nebulization every 6 (six) hours as needed (for wheezing and shortness of breath). 09/10/20   Ghimire, Henreitta Leber, MD  ?metoprolol tartrate (LOPRESSOR) 25 MG tablet TAKE 1 TABLET(25 MG) BY MOUTH TWICE DAILY ?Patient taking differently: Take 25 mg by mouth 2 (two) times daily. 08/24/21   [name], DO  ?nitroGLYCERIN (NITROSTAT) 0.4 MG SL tablet Place 1 tablet (0.4 mg total) under the tongue every 5 (five) minutes as needed for chest pain. 12/10/21   [name], DO  ?oxymetazoline (AFRIN) 0.05 % nasal spray Place 1 spray into both nostrils 2 (two) times daily as needed for congestion (nose bleeds).    [provider]  ?predniSONE (DELTASONE) 10 MG tablet Take 4 tabs (40 mg total) by mouth daily with breakfast for 1 day, THEN 3 tabs daily with breakfast for 1 day, THEN 2 tabs daily with breakfast for 1 day, THEN 1 tab daily with breakfast for 1 day. 01/17/22 01/21/22  Zola Button, MD  ?predniSONE (DELTASONE) 50 MG tablet Take 1 tablet (50mg ) by mouth once daily 01/11/22  Shary Key, DO  ?rosuvastatin (CRESTOR) 40 MG tablet Take 40 mg by mouth daily. 11/02/21   [provider]  ?loratadine (CLARITIN) 10 MG tablet Take 1 tablet (10 mg total) by mouth daily. 03/04/11 05/23/18  Mariana Arn, MD  ?   ? ?Allergies    ?Ace inhibitors, Ampicillin, Penicillins, Codeine, Lisinopril, and Aspirin   ? ?Review of Systems   ?Review of Systems  ?Unable to perform ROS: Acuity of condition  ? ?Physical Exam ?Updated Vital Signs ?BP  105/69   Pulse 76   Resp 20   Ht 1.803 m (5\' 11" )   SpO2 100%   BMI 19.96 kg/m?  ?Physical Exam ?Vitals and nursing note reviewed.  ?Constitutional:   ?   General: He is in acute distress.  ?   Appearance: He is well-developed. He is diaphoretic.  ?HENT:  ?   Head: Normocephalic and atraumatic.  ?   Right Ear: External ear normal.  ?   Left Ear: External ear normal.  ?   Mouth/Throat:  ?   Comments: Poor dentition ?Eyes:  ?   General: No scleral icterus.    ?   Right eye: No discharge.     ?   Left eye: No discharge.  ?   Conjunctiva/sclera: Conjunctivae normal.  ?Neck:  ?   Trachea: No tracheal deviation.  ?   Comments: JVD ?Cardiovascular:  ?   Rate and Rhythm: Normal rate and regular rhythm.  ?Pulmonary:  ?   Effort: Respiratory distress present.  ?   Breath sounds: No stridor. Wheezing present. No rales.  ?   Comments: Diminished breath sounds ?Abdominal:  ?   General: Bowel sounds are normal. There is no distension.  ?   Palpations: Abdomen is soft.  ?   Tenderness: There is no abdominal tenderness. There is no guarding or rebound.  ?Musculoskeletal:     ?   General: No tenderness or deformity.  ?   Cervical back: Neck supple.  ?Skin: ?   General: Skin is warm.  ?   Findings: No rash.  ?   Comments: No pallor or cyanosis  ?Neurological:  ?   GCS: GCS eye subscore is 1. GCS verbal subscore is 2. GCS motor subscore is 5.  ?   Cranial Nerves: No cranial nerve deficit (no facial droop,).  ?   Motor: No abnormal muscle tone or seizure activity.  ? ? ?ED Results / Procedures / Treatments   ?Labs ?(all labs ordered are listed, but only abnormal results are displayed) ?Labs Reviewed  ?CBC - Abnormal; Notable for the following components:  ?    Result Value  ? WBC 12.1 (*)   ? RBC 3.79 (*)   ? Hemoglobin 11.9 (*)   ? HCT 38.6 (*)   ? MCV 101.8 (*)   ? All other components within normal limits  ?BRAIN NATRIURETIC PEPTIDE - Abnormal; Notable for the following components:  ? B Natriuretic Peptide 246.7 (*)   ? All  other components within normal limits  ?COMPREHENSIVE METABOLIC PANEL - Abnormal; Notable for the following components:  ? Glucose, Bld 187 (*)   ? AST 141 (*)   ? ALT 100 (*)   ? All other components within normal limits  ?AMMONIA - Abnormal; Notable for the following components:  ? Ammonia 61 (*)   ? All other components within normal limits  ?I-STAT ARTERIAL BLOOD GAS, ED - Abnormal; Notable for the following components:  ? pCO2 arterial 55.7 (*)   ?  pO2, Arterial 538 (*)   ? Bicarbonate 32.9 (*)   ? TCO2 35 (*)   ? Acid-Base Excess 6.0 (*)   ? HCT 36.0 (*)   ? Hemoglobin 12.2 (*)   ? All other components within normal limits  ?TROPONIN I (HIGH SENSITIVITY) - Abnormal; Notable for the following components:  ? Troponin I (High Sensitivity) 58 (*)   ? All other components within normal limits  ?RESP PANEL BY RT-PCR (FLU A&B, COVID) [id number]  ?PROTIME-INR  ?ETHANOL  ?TROPONIN I (HIGH SENSITIVITY)  ? ? ?EKG ?EKG Interpretation ? ?Date/Time:  Saturday January 15 2022 09:27:11 EDT ?Ventricular Rate:  88 ?PR Interval:  168 ?QRS Duration: 85 ?QT Interval:  355 ?QTC Calculation: 430 ?R Axis:   98 ?Text Interpretation: Right and left arm electrode reversal, interpretation assumes no reversal Sinus rhythm Biatrial enlargement Consider left ventricular hypertrophy Lateral infarct, old Confirmed by Dorie Rank [phone]) on 01/15/2022 10:01:20 AM ? ?Radiology ?DG Chest Port 1 View ? ?Result Date: 01/15/2022 ?CLINICAL DATA:  71 year old male with history of dyspnea. EXAM: PORTABLE CHEST 1 VIEW COMPARISON:  Chest x-ray 01/07/2022. FINDINGS: An endotracheal tube is in place with tip 2.3 cm above the carina. Status post median sternotomy for CABG. Fiducial markers in the right lung are again noted, with persistent mass-like area of architectural distortion in the right upper lobe, similar to prior examinations, likely to reflect an area of chronic postradiation mass-like fibrosis based on prior CT examinations. No new acute consolidative  airspace disease. Chronic pleuroparenchymal thickening and architectural distortion in the base of the thorax, similar to prior studies. No evidence of pulmonary edema. Heart size is normal. Upper mediastinal contours

## 2022-01-15 NOTE — Progress Notes (Addendum)
Decreased O2 to 40% due to ABG result, also pulled ETT 1 cm to 24 cm at the lip per Dr. Tomi Bamberger verbal order at the patient's bedside. ?

## 2022-01-15 NOTE — ED Triage Notes (Signed)
Pt BIB [facility] from home for respiratory distress. Pt was standing over his couch barely breathing and pouring sweat per EMS. Pt received a duoneb, 125 solumedrol, 2 of mag and .3 of epi IM.  ?

## 2022-01-15 NOTE — Progress Notes (Signed)
Pt was transported via ventilator to CT with no apparent complications. Pt is currently stable back in 16M. RT will continue to monitor for any changes. ?

## 2022-01-16 ENCOUNTER — Inpatient Hospital Stay (HOSPITAL_COMMUNITY): Payer: Medicare Other

## 2022-01-16 DIAGNOSIS — L8990 Pressure ulcer of unspecified site, unspecified stage: Secondary | ICD-10-CM | POA: Insufficient documentation

## 2022-01-16 LAB — GLUCOSE, CAPILLARY
Glucose-Capillary: 105 mg/dL — ABNORMAL HIGH (ref 70–99)
Glucose-Capillary: 106 mg/dL — ABNORMAL HIGH (ref 70–99)
Glucose-Capillary: 109 mg/dL — ABNORMAL HIGH (ref 70–99)
Glucose-Capillary: 118 mg/dL — ABNORMAL HIGH (ref 70–99)
Glucose-Capillary: 131 mg/dL — ABNORMAL HIGH (ref 70–99)
Glucose-Capillary: 64 mg/dL — ABNORMAL LOW (ref 70–99)
Glucose-Capillary: 81 mg/dL (ref 70–99)
Glucose-Capillary: 99 mg/dL (ref 70–99)

## 2022-01-16 LAB — STREP PNEUMONIAE URINARY ANTIGEN: Strep Pneumo Urinary Antigen: NEGATIVE

## 2022-01-16 MED ORDER — SODIUM CHLORIDE 0.9 % IV SOLN: 250.0000 mL | INTRAVENOUS | Status: DC

## 2022-01-16 MED ORDER — NOREPINEPHRINE 4 MG/250ML-% IV SOLN: 2.0000 ug/min | INTRAVENOUS | Status: DC

## 2022-01-16 NOTE — Progress Notes (Signed)
? ?  NAME:  Lucas [name], MRN:  [id number], DOB:  1950-12-23, LOS: 1 ?ADMISSION DATE:  01/15/2022, CONSULTATION DATE:  3/25 ?REFERRING MD:  Dr. Tomi Bamberger, CHIEF COMPLAINT:  SOB   ? ?History of Present Illness:  ?71 yo male smoker with COPD and chronic respiratory failure on 3 liters called EMS with c/o dyspnea.  SpO2 in 70's on room air and diaphoretic.  Developed AMS.  Intubated in ER.  Noted to have bed bug infestation.  Had admission earlier this month for the same.  Started on therapy for COPD exacerbation. ? ?Pertinent  Medical History  ?HTN, HLD, ETOH, Stage 1A NSCLC (Squamous cell) s/p XRT ? ?Significant Hospital Events: ?Including procedures, antibiotic start and stop dates in addition to other pertinent events   ?3/25 Admit, VDRF, possible aspiration, start ABx ? ?Interim History / Subjective:  ?No events. ?Remains on vent. ?Mildly bradycardic. ?RASS -2. ? ?Objective   ?Blood pressure (!) 88/55, pulse (!) 48, temperature 97.7 ?F (36.5 ?C), temperature source Oral, resp. rate 18, height 5\' 11"  (1.803 m), SpO2 100 %. ?   ?Vent Mode: PRVC ?FiO2 (%):  [32 %-100 %] 35 % ?Set Rate:  [18 bmp-20 bmp] 18 bmp ?Vt Set:  [20 mL-550 mL] 550 mL ?PEEP:  [5 cmH20] 5 cmH20 ?Plateau Pressure:  [21 ACZ66-06 cmH20] 23 cmH20  ? ?Intake/Output Summary (Last 24 hours) at 01/16/2022 0817 ?Last data filed at 01/16/2022 0600 ?Gross per 24 hour  ?Intake 3064.84 ml  ?Output 2175 ml  ?Net 889.84 ml  ? ?There were no vitals filed for this visit. ? ?Examination: ? ?Chronically ill appearing man on vent ?+Temporal wasting, ETT with minimal secretions ?Lungs diminished bilaterally, +bronchospasm on vent, peaks mid 30s, plat 25 ?Ext warm, +muscle wasting, no edema ?No rashes ?Withdraws x 4 ? ?Labs pending ?CXR PNA ? ?Resolved Hospital Problem list   ? ? ?Assessment & Plan:  ? ?Acute on chronic respiratory failure with hypoxia and hypercapnia from COPD exacerbation and possible aspiration pneumonitis. ?Tobacco abuse. ?- vent suppport, VAP prevention  bundle ?- Levaquin, steroids as ordered ?- LABA/LAMA/ICS nebs ?- tobacco cessation counseling once off vent ?- SAT/SBT in AM, bronchospasm precludes extubation today ? ?Acute metabolic encephalopathy from hypoxia/hypercapnia.  CT head benign ?Hx of anxiety. ?- Continue prop/fent RASS goal 0 to -1 ? ?Steroid induced hyperglycemia. ?- SSI ? ?Bed bug infestation. ?- contact isolation ? ?Hx of HTN, HLD, PAF, CAD s/p CABG. ?- continue crestor ?- hold outpt lopressor given borderline pressures ? ?Best Practice (right click and "Reselect all SmartList Selections" daily)  ?Diet/type: tubefeeds ?DVT prophylaxis: LMWH ?GI prophylaxis: PPI ?Lines: N/A ?Foley:  N/A ?Code Status:  full code ?Last date of multidisciplinary goals of care discussion [by Dr. Halford Chessman 01/15/22] ? ?34 min cc time ?Erskine Emery MD PCCM ?

## 2022-01-16 NOTE — Progress Notes (Signed)
An USGPIV (ultrasound guided PIV) has been placed for short-term vasopressor infusion. A correctly placed ivWatch must be used when administering Vasopressors. Should this treatment be needed beyond 72 hours, central line access should be obtained.  It will be the responsibility of the bedside nurse to follow best practice to prevent extravasations.   ?

## 2022-01-16 NOTE — Progress Notes (Signed)
eLink Physician-Brief Progress Note ?Patient Name: Lucas [name] ?DOB: 19-Nov-1950 ?MRN: [id number] ? ? ?Date of Service ? 01/16/2022  ?HPI/Events of Note ? Urinary retention - Bladder scan with residual = 874.  ?eICU Interventions ? Plan: ?I/O cath PRN.   ? ? ? ?Intervention Category ?Major Interventions: Other: ? ?Jarry Manon Cornelia Copa ?01/16/2022, 12:04 AM ?

## 2022-01-16 NOTE — Progress Notes (Signed)
Per MD pt to rest today, no wean or wake up assessment. ?

## 2022-01-17 ENCOUNTER — Inpatient Hospital Stay (HOSPITAL_COMMUNITY): Payer: Medicare Other

## 2022-01-17 ENCOUNTER — Inpatient Hospital Stay: Payer: Medicare Other

## 2022-01-17 LAB — COMPREHENSIVE METABOLIC PANEL
ALT: 58 U/L — ABNORMAL HIGH (ref 0–44)
AST: 40 U/L (ref 15–41)
Albumin: 2.9 g/dL — ABNORMAL LOW (ref 3.5–5.0)
Alkaline Phosphatase: 55 U/L (ref 38–126)
Anion gap: 4 — ABNORMAL LOW (ref 5–15)
BUN: 12 mg/dL (ref 8–23)
CO2: 30 mmol/L (ref 22–32)
Calcium: 9.2 mg/dL (ref 8.9–10.3)
Chloride: 106 mmol/L (ref 98–111)
Creatinine, Ser: 0.79 mg/dL (ref 0.61–1.24)
GFR, Estimated: >60 mL/min (ref >60)
Glucose, Bld: 97 mg/dL (ref 70–99)
Potassium: 4.1 mmol/L (ref 3.5–5.1)
Sodium: 140 mmol/L (ref 135–145)
Total Bilirubin: 0.7 mg/dL (ref 0.3–1.2)
Total Protein: 5.9 g/dL — ABNORMAL LOW (ref 6.5–8.1)

## 2022-01-17 LAB — CBC WITH DIFFERENTIAL/PLATELET
Abs Immature Granulocytes: 0.03 10*3/uL (ref 0.00–0.07)
Basophils Absolute: 0 10*3/uL (ref 0.0–0.1)
Basophils Relative: 0 %
Eosinophils Absolute: 0 10*3/uL (ref 0.0–0.5)
Eosinophils Relative: 0 %
HCT: 34.3 % — ABNORMAL LOW (ref 39.0–52.0)
Hemoglobin: 11.2 g/dL — ABNORMAL LOW (ref 13.0–17.0)
Immature Granulocytes: 0 %
Lymphocytes Relative: 14 %
Lymphs Abs: 1.2 10*3/uL (ref 0.7–4.0)
MCH: 31.4 pg (ref 26.0–34.0)
MCHC: 32.7 g/dL (ref 30.0–36.0)
MCV: 96.1 fL (ref 80.0–100.0)
Monocytes Absolute: 0.8 10*3/uL (ref 0.1–1.0)
Monocytes Relative: 10 %
Neutro Abs: 6.3 10*3/uL (ref 1.7–7.7)
Neutrophils Relative %: 76 %
Platelets: 196 10*3/uL (ref 150–400)
RBC: 3.57 MIL/uL — ABNORMAL LOW (ref 4.22–5.81)
RDW: 14.5 % (ref 11.5–15.5)
WBC: 8.4 10*3/uL (ref 4.0–10.5)
nRBC: 0 % (ref 0.0–0.2)

## 2022-01-17 LAB — GLUCOSE, CAPILLARY
Glucose-Capillary: 101 mg/dL — ABNORMAL HIGH (ref 70–99)
Glucose-Capillary: 105 mg/dL — ABNORMAL HIGH (ref 70–99)
Glucose-Capillary: 96 mg/dL (ref 70–99)
Glucose-Capillary: 145 mg/dL — ABNORMAL HIGH (ref 70–99)
Glucose-Capillary: 106 mg/dL — ABNORMAL HIGH (ref 70–99)

## 2022-01-17 LAB — MAGNESIUM: Magnesium: 2.1 mg/dL (ref 1.7–2.4)

## 2022-01-17 LAB — PHOSPHORUS: Phosphorus: 4.7 mg/dL — ABNORMAL HIGH (ref 2.5–4.6)

## 2022-01-17 MED ORDER — ROSUVASTATIN CALCIUM 20 MG PO TABS
40.0000 mg | ORAL_TABLET | Freq: Every day | ORAL | Status: DC
  Administered 2022-01-18 – 2022-01-20 (×3): 40 mg via ORAL
  Filled 2022-01-17 (×3): qty 2

## 2022-01-17 MED ORDER — DOXYCYCLINE HYCLATE 100 MG PO TABS
100.0000 mg | ORAL_TABLET | Freq: Two times a day (BID) | ORAL | Status: DC
Start: 2022-01-17 — End: 2022-01-17
  Administered 2022-01-17: 100 mg
  Filled 2022-01-17 (×2): qty 1

## 2022-01-17 MED ORDER — DOXYCYCLINE HYCLATE 100 MG PO TABS
100.0000 mg | ORAL_TABLET | Freq: Two times a day (BID) | ORAL | Status: AC
  Administered 2022-01-17 – 2022-01-19 (×5): 100 mg via ORAL
  Filled 2022-01-17 (×5): qty 1

## 2022-01-17 MED ORDER — ORAL CARE MOUTH RINSE
15.0000 mL | Freq: Two times a day (BID) | OROMUCOSAL | Status: DC
  Administered 2022-01-17 – 2022-01-20 (×4): 15 mL via OROMUCOSAL

## 2022-01-17 MED ORDER — LORAZEPAM 2 MG/ML IJ SOLN: 1.0000 mg | INTRAMUSCULAR | Status: DC | PRN

## 2022-01-17 MED ORDER — PHENOBARBITAL 32.4 MG PO TABS: 32.4000 mg | ORAL_TABLET | Freq: Three times a day (TID) | ORAL | Status: DC

## 2022-01-17 MED ORDER — PREDNISONE 20 MG PO TABS
40.0000 mg | ORAL_TABLET | Freq: Every day | ORAL | Status: AC
  Administered 2022-01-18 – 2022-01-20 (×3): 40 mg via ORAL
  Filled 2022-01-17 (×3): qty 2

## 2022-01-17 MED ORDER — PHENOBARBITAL 32.4 MG PO TABS
64.8000 mg | ORAL_TABLET | Freq: Three times a day (TID) | ORAL | Status: DC
  Administered 2022-01-17: 64.8 mg via ORAL
  Filled 2022-01-17 (×2): qty 2

## 2022-01-17 MED ORDER — TAMSULOSIN HCL 0.4 MG PO CAPS
0.4000 mg | ORAL_CAPSULE | Freq: Every day | ORAL | Status: DC
  Administered 2022-01-17 – 2022-01-20 (×4): 0.4 mg via ORAL
  Filled 2022-01-17 (×4): qty 1

## 2022-01-17 NOTE — Progress Notes (Signed)
eLink Physician-Brief Progress Note ?Patient Name: Lucas [name] ?DOB: [birth year] ?MRN: [id number] ? ? ?Date of Service ? 01/17/2022  ?HPI/Events of Note ? Patient extubated today and doing well, no current symptoms of ETOH withdrawal but he has scheduled Phenobarbital and PRN Ativan.  ?eICU Interventions ? Ativan and Phenobarbital discontinued, Nurse driven CIWA protocol for delirium assessment ordered.  ? ? ? ?  ? ?Kerry Kass Colman Birdwell ?01/17/2022, 10:02 PM ?

## 2022-01-17 NOTE — Progress Notes (Signed)
? ?  NAME:  Lucas [name], MRN:  [id number], DOB:  Sep 14, 1951, LOS: 2 ?ADMISSION DATE:  01/15/2022, CONSULTATION DATE:  3/25 ?REFERRING MD:  Dr. Tomi Bamberger, CHIEF COMPLAINT:  SOB   ? ?History of Present Illness:  ?71 yo male smoker with COPD and chronic respiratory failure on 3 liters called EMS with c/o dyspnea.  SpO2 in 70's on room air and diaphoretic.  Developed AMS.  Intubated in ER.  Noted to have bed bug infestation.  Had admission earlier this month for the same.  Started on therapy for COPD exacerbation. ? ?Pertinent  Medical History  ?HTN, HLD, ETOH, Stage 1A NSCLC (Squamous cell) s/p XRT ? ?Significant Hospital Events: ?Including procedures, antibiotic start and stop dates in addition to other pertinent events   ?3/25 Admit, VDRF, possible aspiration, start ABx ? ?Interim History / Subjective:  ?No events. ?Remains on vent. ?Intermittently bradycardic. ?RASS -1. ? ?Objective   ?Blood pressure (!) 137/101, pulse (!) 54, temperature 98.1 ?F (36.7 ?C), temperature source Axillary, resp. rate 18, height 5\' 11"  (1.803 m), SpO2 100 %. ?   ?Vent Mode: PRVC ?FiO2 (%):  [35 %-40 %] 40 % ?Set Rate:  [18 bmp] 18 bmp ?Vt Set:  [550 mL-600 mL] 600 mL ?PEEP:  [5 cmH20] 5 cmH20 ?Plateau Pressure:  [22 QTM22-63 cmH20] 26 cmH20  ? ?Intake/Output Summary (Last 24 hours) at 01/17/2022 0950 ?Last data filed at 01/17/2022 0600 ?Gross per 24 hour  ?Intake 2461.33 ml  ?Output 1840 ml  ?Net 621.33 ml  ? ?There were no vitals filed for this visit. ? ?Examination: ?Chronically ill appearing man on vent ?+Temporal wasting, ETT with minimal secretions ?Lungs sounds clear in upper lobes; vent sounds appreciated ?Ext warm, +muscle wasting, no edema ?No rashes ?Withdraws x 4 ? ?Resolved Hospital Problem list   ? ? ?Assessment & Plan:  ? ?Acute on chronic respiratory failure with hypoxia and hypercapnia from COPD exacerbation ?Tobacco abuse. ?- vent suppport, VAP prevention bundle ?-Strep antigen negative ?- Doxycycline 100mg  BID x4days ?-  LABA/LAMA/ICS nebs ?- tobacco cessation counseling once off vent ?- SAT/SBT today ? ?Acute metabolic encephalopathy from hypoxia/hypercapnia.  CT head benign ?Hx of anxiety. ?- wean off prop/fent RASS goal 0 to -1 ? ?Steroid induced hyperglycemia. ?- SSI ? ?Bed bug infestation. ?- contact isolation ? ?Hx of HTN, HLD, PAF, CAD s/p CABG. ?- continue crestor ?- pressures have improved; can restart lopressor once off vent, SBP > 130s ? ?Best Practice (right click and "Reselect all SmartList Selections" daily)  ?Diet/type: tubefeeds ?DVT prophylaxis: LMWH ?GI prophylaxis: PPI ?Lines: N/A ?Foley:  N/A ?Code Status:  full code ?Last date of multidisciplinary goals of care discussion [by Dr. Halford Chessman 01/15/22] ? ?34 min cc time ?Erskine Emery MD PCCM ?

## 2022-01-17 NOTE — Procedures (Signed)
Extubation Procedure Note ? ?Patient Details:   ?Name: Lucas [name] ?DOB: Mar 10, 1951 ?MRN: [id number] ?  ?Airway Documentation:  ?  ?Vent end date: 01/17/22 Vent end time: 1025  ? ?Evaluation ? O2 sats: stable throughout ?Complications: No apparent complications ?Patient did tolerate procedure well. ?Bilateral Breath Sounds: Clear, Diminished ?  ?Yes ?Positive cuff leak test noted. Pt placed on 2L [place] no stridor noted. ? ?Alayza Pieper ?01/17/2022, 10:34 AM ? ?

## 2022-01-18 DIAGNOSIS — J441 Chronic obstructive pulmonary disease with (acute) exacerbation: Secondary | ICD-10-CM

## 2022-01-18 LAB — LEGIONELLA PNEUMOPHILA SEROGP 1 UR AG: L. pneumophila Serogp 1 Ur Ag: NEGATIVE

## 2022-01-18 LAB — BASIC METABOLIC PANEL
Anion gap: 7 (ref 5–15)
BUN: 16 mg/dL (ref 8–23)
CO2: 28 mmol/L (ref 22–32)
Calcium: 9.3 mg/dL (ref 8.9–10.3)
Chloride: 102 mmol/L (ref 98–111)
Creatinine, Ser: 0.8 mg/dL (ref 0.61–1.24)
GFR, Estimated: >60 mL/min (ref >60)
Glucose, Bld: 100 mg/dL — ABNORMAL HIGH (ref 70–99)
Potassium: 4.8 mmol/L (ref 3.5–5.1)
Sodium: 137 mmol/L (ref 135–145)

## 2022-01-18 LAB — MAGNESIUM: Magnesium: 1.9 mg/dL (ref 1.7–2.4)

## 2022-01-18 LAB — PHOSPHORUS: Phosphorus: 4.4 mg/dL (ref 2.5–4.6)

## 2022-01-18 LAB — GLUCOSE, CAPILLARY
Glucose-Capillary: 111 mg/dL — ABNORMAL HIGH (ref 70–99)
Glucose-Capillary: 113 mg/dL — ABNORMAL HIGH (ref 70–99)
Glucose-Capillary: 121 mg/dL — ABNORMAL HIGH (ref 70–99)
Glucose-Capillary: 141 mg/dL — ABNORMAL HIGH (ref 70–99)
Glucose-Capillary: 76 mg/dL (ref 70–99)
Glucose-Capillary: 93 mg/dL (ref 70–99)
Glucose-Capillary: 96 mg/dL (ref 70–99)

## 2022-01-18 MED ORDER — METOPROLOL TARTRATE 25 MG PO TABS
25.0000 mg | ORAL_TABLET | Freq: Two times a day (BID) | ORAL | Status: DC
  Administered 2022-01-18 – 2022-01-20 (×5): 25 mg via ORAL
  Filled 2022-01-18 (×5): qty 1

## 2022-01-18 NOTE — Evaluation (Signed)
Physical Therapy Evaluation ?Patient Details ?Name: Lucas [name] ?MRN: [id number] ?DOB: [birth year] ?Today's Date: 01/18/2022 ? ?History of Present Illness ? Pt adm 3/25 with acute on chronic respiratory failure with hypoxia and hypercapnia from COPD exacerbation and possible aspiration pneumonitis. Pt intubated in ED and extubated 3/27. PMH - copd, chronic resp failure on 3L O2, HTN, etoh, lung CA, CABG, rt tibial fx, lt tibial fx  ?Clinical Impression ? Pt presents to PT with decr mobility primarily due to respiratory status and deconditioning associated with 3 hospital admissions in the past month. Pt reports he was moving around fairly well at home since last discharge until his respiratory status started declining again. Expect he will make good progress back to baseline if medical/respiratory status continues to improve. Expect can return home with wife at DC from PT standpoint.  ?   ? ?Recommendations for follow up therapy are one component of a multi-disciplinary discharge planning process, led by the attending physician.  Recommendations may be updated based on patient status, additional functional criteria and insurance authorization. ? ?Follow Up Recommendations Home health PT ? ?  ?Assistance Recommended at Discharge Intermittent Supervision/Assistance  ?Patient can return home with the following ? A little help with walking and/or transfers;Help with stairs or ramp for entrance;Assist for transportation;Assistance with cooking/housework;A little help with bathing/dressing/bathroom ? ?  ?Equipment Recommendations None recommended by PT  ?Recommendations for Other Services ?    ?  ?Functional Status Assessment Patient has had a recent decline in their functional status and demonstrates the ability to make significant improvements in function in a reasonable and predictable amount of time.  ? ?  ?Precautions / Restrictions Precautions ?Precautions: Fall;Other (comment) ?Precaution Comments: watch  SpO2 ?Restrictions ?Weight Bearing Restrictions: No  ? ?  ? ?Mobility ? Bed Mobility ?Overal bed mobility: Modified Independent ?  ?  ?  ?  ?  ?  ?General bed mobility comments:  (Incr time) ?  ? ?Transfers ?Overall transfer level: Needs assistance ?Equipment used: Rollator (4 wheels) ?Transfers: Sit to/from Stand ?Sit to Stand: Supervision ?  ?  ?  ?  ?  ?General transfer comment: supervision for safety ?  ? ?Ambulation/Gait ?Ambulation/Gait assistance: Supervision ?Gait Distance (Feet): 150 Feet ?Assistive device: Rollator (4 wheels) ?Gait Pattern/deviations: Step-through pattern, Decreased stride length, Trunk flexed ?Gait velocity: decr ?Gait velocity interpretation: 1.31 - 2.62 ft/sec, indicative of limited community ambulator ?  ?General Gait Details: assist for safety and lines ? ?Stairs ?  ?  ?  ?  ?  ? ?Wheelchair Mobility ?  ? ?Modified Rankin (Stroke Patients Only) ?  ? ?  ? ?Balance Overall balance assessment: Needs assistance ?Sitting-balance support: No upper extremity supported, Feet supported ?Sitting balance-Leahy Scale: Good ?  ?  ?Standing balance support: No upper extremity supported, During functional activity ?Standing balance-Leahy Scale: Fair ?  ?  ?  ?  ?  ?  ?  ?  ?  ?  ?  ?  ?   ? ? ? ?Pertinent Vitals/Pain Pain Assessment ?Pain Assessment: No/denies pain  ? ? ?Home Living Family/patient expects to be discharged to:: Private residence ?Living Arrangements: Spouse/significant other ?Available Help at Discharge: Family;Available 24 hours/day ?Type of Home: Apartment ?Home Access: Stairs to enter ?Entrance Stairs-Rails: Left ?Entrance Stairs-Number of Steps: 2 ?  ?Home Layout: One level ?Home Equipment: Conservation officer, nature (2 wheels);Cane - single point;Shower seat;Crutches ?Additional Comments: Oxygen  ?  ?Prior Function Prior Level of Function : Needs assist ?  ?  ?  ?  Physical Assist : ADLs (physical) ?  ?ADLs (physical): IADLs ?Mobility Comments: ambulates independently without equipment ?ADLs  Comments: Reports he performs light IADLs, but spouse takes care of most.  Uses [facility] for MD appointments.  Spouse assists with bathing. ?  ? ? ?Hand Dominance  ? Dominant Hand: Right ? ?  ?Extremity/Trunk Assessment  ? Upper Extremity Assessment ?Upper Extremity Assessment: Overall WFL for tasks assessed ?  ? ?Lower Extremity Assessment ?Lower Extremity Assessment: Generalized weakness ?  ? ?   ?Communication  ? Communication: No difficulties  ?Cognition Arousal/Alertness: Awake/alert ?Behavior During Therapy: [facility] for tasks assessed/performed ?Overall Cognitive Status: Within Functional Limits for tasks assessed ?  ?  ?  ?  ?  ?  ?  ?  ?  ?  ?  ?  ?  ?  ?  ?  ?  ?  ?  ? ?  ?General Comments General comments (skin integrity, edema, etc.): Pt on 5L of O2 at rest with SpO2 100%. Placed on 6L for amb and pt >90% ? ?  ?Exercises    ? ?Assessment/Plan  ?  ?PT Assessment Patient needs continued PT services  ?PT Problem List Decreased strength;Decreased activity tolerance;Decreased balance;Decreased mobility;Cardiopulmonary status limiting activity ? ?   ?  ?PT Treatment Interventions DME instruction;Gait training;Functional mobility training;Therapeutic activities;Therapeutic exercise;Balance training;Patient/family education   ? ?PT Goals (Current goals can be found in the Care Plan section)  ?Acute Rehab PT Goals ?Patient Stated Goal: stay out of hospital ?PT Goal Formulation: With patient ?Time For Goal Achievement: 01/25/22 ?Potential to Achieve Goals: Good ? ?  ?Frequency Min 3X/week ?  ? ? ?Co-evaluation   ?  ?  ?  ?  ? ? ?  ?AM-PAC PT "6 Clicks" Mobility  ?Outcome Measure Help needed turning from your back to your side while in a flat bed without using bedrails?: None ?Help needed moving from lying on your back to sitting on the side of a flat bed without using bedrails?: None ?Help needed moving to and from a bed to a chair (including a wheelchair)?: A Little ?Help needed standing up from a chair using your arms  (e.g., wheelchair or bedside chair)?: A Little ?Help needed to walk in hospital room?: A Little ?Help needed climbing 3-5 steps with a railing? : A Little ?6 Click Score: 20 ? ?  ?End of Session Equipment Utilized During Treatment: Oxygen ?Activity Tolerance: Patient tolerated treatment well ?Patient left: in bed;with call bell/phone within reach;with bed alarm set ?Nurse Communication: Mobility status ?PT Visit Diagnosis: Other abnormalities of gait and mobility (R26.89);Unsteadiness on feet (R26.81) ?  ? ?Time: 7741-4239 ?PT Time Calculation (min) (ACUTE ONLY): 19 min ? ? ?Charges:   PT Evaluation ?$PT Eval Moderate Complexity: 1 Mod ?  ?  ?   ? ?[facility] ?Acute Rehabilitation Services ?Pager [phone] ?Office [phone] ? ? ? ?Shary Decamp [facility] ?01/18/2022, 2:44 PM ? ?

## 2022-01-18 NOTE — Progress Notes (Signed)
?PROGRESS NOTE ? ?Lucas [name]  ?DOB: 07/01/1951  ?PCP: [name], DO ?BDZ:[id number]  ?DOA: 01/15/2022 ? LOS: 3 days  ?Hospital Day: 4 ? ?Brief narrative: ?Lucas [name] is a 71 y.o. male with PMH significant for HTN, HLD, chronic alcoholic, chronic smoker, COPD on 3 L oxygen at home, stage Ia squamous cell cancer of lung s/p radiation, GI bleeding, bilateral AV necrosis of femurs. ?Patient was brought by EMS to the ED on 3/25 with complaint of dyspnea, diaphoresis, hypoxia with oxygen saturation down to 70%, altered mental status. ?He was intubated in the ED.   ?Admitted to the ICU for COPD exacerbation.  He was admitted earlier this month for the same reason.   ?He was noted to have bedbug infestation. ?3/27, patient was extubated, transferred out to [facility] service ? ?Subjective: ?Patient was seen and examined this morning.  Sitting up in bed.  Not in distress.  On 3 L oxygen by nasal cannula.  Per nursing, on try to get him out of bed to potty chair, patient was significantly short of breath.  Improved after he was put back in bed. ?Chart reviewed.  No fever in last 24 hours, heart rate in normal range, blood pressure elevated up to 150s and 160s.  On 3 L oxygen. ? ?Principal Problem: ?  COPD exacerbation ([place]) ?Active Problems: ?  Pressure injury of skin ?  ?Assessment and Plan: ?Acute on chronic respiratory failure with hypoxia and hypercapnia  ?COPD exacerbation ?Chronic daily smoker ?-Intubated in the ED.  Extubated on 3/27  ?-Currently on 3 L oxygen which is his baseline requirement.   ?-Strep antigen negative ?-Doxycycline 100mg  BID x4days ?-Continue tapering course of oral prednisone. ?-LABA/LAMA/ICS nebs ?-tobacco cessation counseling done. ?  ?Acute metabolic encephalopathy from hypoxia/hypercapnia.   ?-CT head unremarkable ? ?Hx of anxiety. ?-Hydroxyzine as needed. ?  ?Steroid induced hyperglycemia. ?-SSI ?  ?Bed bug infestation. ?-contact isolation ?  ?Hx of CAD s/p CABG, HLD ?-continue  crestor ? ?Hypertension ?-Home meds include metoprolol 25 mg twice daily.  Currently on hold blood pressure starting to rise.  Resume metoprolol. ? ?PAF ?-Heart rate stable ? ?Goals of care ?  Code Status: Prior  ? ? ?Mobility: Encourage ambulation.  PT eval pending. ? ?Nutritional status:  ?Body mass index is 19.96 kg/m?.  ?  ?  ? ? ? ? ?Diet:  ?Diet Order   ? ?       ?  Diet regular Room service appropriate? Yes; Fluid consistency: Thin  Diet effective now       ?  ? ?  ?  ? ?  ? ? ?DVT prophylaxis:  ?enoxaparin (LOVENOX) injection 40 mg Start: 01/15/22 1145 ?  ?Antimicrobials: Doxycycline oral ?Fluid: NS at 75 mill per hour ?Consultants: PCCM ?Family Communication: None at bedside ? ?Status is: Inpatient ? ?Continue in-hospital care because: Ongoing management of COPD exacerbation ?Level of care: Progressive  ? ?Dispo: The patient is from: Home ?             Anticipated d/c is to: [facility] in 1 to 2 days.  Pending PT eval ?             Patient currently is not medically stable to d/c. ?  Difficult to place patient No ? ? ? ? ?Infusions:  ? sodium chloride 75 mL/hr at 01/18/22 1100  ? sodium chloride    ? ? ?Scheduled Meds: ? arformoterol  15 mcg Nebulization BID  ? budesonide (PULMICORT)  nebulizer solution  0.5 mg Nebulization BID  ? Chlorhexidine Gluconate Cloth  6 each Topical Q0600  ? doxycycline  100 mg Oral Q12H  ? enoxaparin (LOVENOX) injection  40 mg Subcutaneous Q24H  ? insulin aspart  0-15 Units Subcutaneous Q4H  ? mouth rinse  15 mL Mouth Rinse BID  ? metoprolol tartrate  25 mg Oral BID  ? predniSONE  40 mg Oral Q breakfast  ? revefenacin  175 mcg Nebulization Daily  ? rosuvastatin  40 mg Oral Daily  ? tamsulosin  0.4 mg Oral Daily  ? ? ?PRN meds: ?acetaminophen, albuterol  ? ?Antimicrobials: ?Anti-infectives (From admission, onward)  ? ? Start     Dose/Rate Route Frequency Ordered Stop  ? 01/17/22 2200  doxycycline (VIBRA-TABS) tablet 100 mg       ? 100 mg Oral Every 12 hours 01/17/22 1337  01/20/22 0959  ? 01/17/22 1000  doxycycline (VIBRA-TABS) tablet 100 mg  Status:  Discontinued       ? 100 mg Per Tube Every 12 hours 01/17/22 0831 01/17/22 1337  ? 01/15/22 1145  levofloxacin (LEVAQUIN) IVPB 500 mg  Status:  Discontinued       ? 500 mg ?100 mL/hr over 60 Minutes Intravenous Every 24 hours 01/15/22 1138 01/17/22 1310  ? ?  ? ? ?Objective: ?Vitals:  ? 01/18/22 1200 01/18/22 1300  ?BP: 139/90 (!) 150/77  ?Pulse: 79 100  ?Resp: 16 (!) 25  ?Temp:    ?SpO2: 100% 100%  ? ? ?Intake/Output Summary (Last 24 hours) at 01/18/2022 1521 ?Last data filed at 01/18/2022 1200 ?Gross per 24 hour  ?Intake 1572.61 ml  ?Output 4975 ml  ?Net -3402.39 ml  ? ?There were no vitals filed for this visit. ?Weight change:  ?Body mass index is 19.96 kg/m?.  ? ?Physical Exam: ?General exam: Pleasant, elderly African-American male.  Not in distress ?Skin: No rashes, lesions or ulcers. ?HEENT: Atraumatic, normocephalic, no obvious bleeding ?Lungs: Diminished air entry both bases.  Otherwise clear to auscultation ?CVS: Regular rate and rhythm, no murmur ?GI/Abd soft, nontender, nondistended, bowel sound present ?CNS: Alert, awake, oriented to place and person ?Psychiatry: Mood appropriate ?Extremities: No pedal edema, no calf tenderness ? ?Data Review: I have personally reviewed the laboratory data and studies available. ? ?F/u labs  ?Unresulted Labs (From admission, onward)  ? ?  Start     Ordered  ? 01/17/22 6384  Basic metabolic panel  Daily,   R     ?Question:  Specimen collection method  Answer:  Lab=Lab collect  ? 01/16/22 0819  ? 01/17/22 0500  Magnesium  Daily,   R     ?Question:  Specimen collection method  Answer:  Lab=Lab collect  ? 01/16/22 0819  ? 01/17/22 0500  Phosphorus  Daily,   R     ?Question:  Specimen collection method  Answer:  Lab=Lab collect  ? 01/16/22 0819  ? 01/16/22 1112  Legionella Pneumophila Serogp 1 Ur Ag  Once,   R       ? 01/16/22 1111  ? 01/15/22 1141  Culture, Respiratory w Gram Stain  Once,   R        ? 01/15/22 1141  ? ?  ?  ? ?  ? ? ?Signed, ?Terrilee Croak, MD ?[facility] ?01/18/2022 ? ? ? ? ? ? ? ? ? ? ? ? ?

## 2022-01-19 ENCOUNTER — Other Ambulatory Visit: Payer: Self-pay

## 2022-01-19 ENCOUNTER — Encounter (HOSPITAL_COMMUNITY): Payer: Self-pay | Admitting: Pulmonary Disease

## 2022-01-19 ENCOUNTER — Telehealth: Payer: Medicare Other

## 2022-01-19 ENCOUNTER — Inpatient Hospital Stay: Payer: Medicare Other | Admitting: Primary Care

## 2022-01-19 LAB — BASIC METABOLIC PANEL
Anion gap: 7 (ref 5–15)
BUN: 14 mg/dL (ref 8–23)
CO2: 27 mmol/L (ref 22–32)
Calcium: 9.1 mg/dL (ref 8.9–10.3)
Chloride: 103 mmol/L (ref 98–111)
Creatinine, Ser: 0.73 mg/dL (ref 0.61–1.24)
GFR, Estimated: >60 mL/min (ref >60)
Glucose, Bld: 98 mg/dL (ref 70–99)
Potassium: 4.6 mmol/L (ref 3.5–5.1)
Sodium: 137 mmol/L (ref 135–145)

## 2022-01-19 LAB — GLUCOSE, CAPILLARY
Glucose-Capillary: 105 mg/dL — ABNORMAL HIGH (ref 70–99)
Glucose-Capillary: 105 mg/dL — ABNORMAL HIGH (ref 70–99)
Glucose-Capillary: 105 mg/dL — ABNORMAL HIGH (ref 70–99)
Glucose-Capillary: 110 mg/dL — ABNORMAL HIGH (ref 70–99)
Glucose-Capillary: 123 mg/dL — ABNORMAL HIGH (ref 70–99)
Glucose-Capillary: 177 mg/dL — ABNORMAL HIGH (ref 70–99)

## 2022-01-19 LAB — PHOSPHORUS: Phosphorus: 3.6 mg/dL (ref 2.5–4.6)

## 2022-01-19 LAB — MAGNESIUM: Magnesium: 1.8 mg/dL (ref 1.7–2.4)

## 2022-01-19 MED ORDER — FUROSEMIDE 10 MG/ML IJ SOLN
20.0000 mg | Freq: Once | INTRAMUSCULAR | Status: AC
Start: 2022-01-19 — End: 2022-01-19
  Administered 2022-01-19: 20 mg via INTRAVENOUS
  Filled 2022-01-19: qty 2

## 2022-01-19 NOTE — Progress Notes (Signed)
?PROGRESS NOTE ? ?[name]  ?DOB: [birth year]  ?PCP: [name], DO ?OXB:[id number]  ?DOA: 01/15/2022 ? LOS: 4 days  ?Hospital Day: 5 ? ?Brief narrative: ?Lucas [name] is a 71 y.o. male with PMH significant for HTN, HLD, chronic alcoholic, chronic smoker, COPD on 3 L oxygen at home, stage Ia squamous cell cancer of lung s/p radiation, GI bleeding, bilateral AV necrosis of femurs. ?Patient was brought by EMS to the ED on 3/25 with complaint of dyspnea, diaphoresis, hypoxia with oxygen saturation down to 70%, altered mental status. ?He was intubated in the ED.   ?Admitted to the ICU for COPD exacerbation.  He was admitted earlier this month for the same reason.   ?He was noted to have bedbug infestation. ?3/27, patient was extubated, transferred out to [facility] service ? ?Subjective: ?Patient was seen and examined this morning.   ?Sitting up in bed.  Not in distress.   ?Yesterday he was able to walk with PT in the unit.  However this morning he was short of breath.  On getting up to the commode.   ?He has crackles on auscultation today.  He was getting IV fluid till this morning.  Urine color light.   ? ?Principal Problem: ?  COPD exacerbation ([place]) ?Active Problems: ?  Pressure injury of skin ?  ?Assessment and Plan: ?Acute on chronic respiratory failure with hypoxia and hypercapnia  ?COPD exacerbation ?Chronic daily smoker ?-Intubated in the ED.  Extubated on 3/27  ?-Currently on 3 L oxygen at rest which is his baseline requirement.  Getting hypoxic on ambulation today. ?-Strep antigen negative ?-Doxycycline 100mg  BID x4days ?-Continue tapering course of oral prednisone. ?-LABA/LAMA/ICS nebs ?-tobacco cessation counseling done. ? ?Volume overload ?-Patient seems volume overloaded today.  He has shortness of breath on exertion, crackles on auscultation and very light color urine. ?-IV fluid was stopped.  Give 1 dose of Lasix IV 20 mg. ?-Expect improvement in respiratory status for discharge tomorrow. ?  ?Acute  metabolic encephalopathy from hypoxia/hypercapnia.   ?-CT head unremarkable ? ?Hx of anxiety. ?-Hydroxyzine as needed. ?  ?Steroid induced hyperglycemia. ?-SSI ?  ?Bed bug infestation. ?-contact isolation ?  ?Hx of CAD s/p CABG, HLD ?-continue crestor ? ?Hypertension ?-Home meds include metoprolol 25 mg twice daily.  Currently on hold blood pressure starting to rise.  Resume metoprolol. ? ?PAF ?-Heart rate stable ? ?Urine retention ?-Foley catheter was placed in the ED.  Remove Foley today for voiding trial. ? ?stage Ia squamous cell cancer of lung s/p radiation ?-Patient has right upper lobe spiculated nodule.  Continue to follow-up as an outpatient. ? ?Goals of care ?  Code Status: Prior  ? ? ?Mobility: Encourage ambulation.  PT eval pending. ? ?Nutritional status:  ?Body mass index is 19.96 kg/m?.  ?  ?  ? ? ? ? ?Diet:  ?Diet Order   ? ?       ?  Diet regular Room service appropriate? Yes; Fluid consistency: Thin  Diet effective now       ?  ? ?  ?  ? ?  ? ? ?DVT prophylaxis:  ?enoxaparin (LOVENOX) injection 40 mg Start: 01/15/22 1145 ?  ?Antimicrobials: Doxycycline oral ?Fluid: Stop IV fluid. ?Consultants: PCCM ?Family Communication: None at bedside ? ?Status is: Inpatient ? ?Continue in-hospital care because: Ongoing management of COPD exacerbation ?Level of care: Progressive  ? ?Dispo: The patient is from: Home ?             Anticipated  d/c is to: [facility] in 1 to 2 days.  Ordered ?             Patient currently is not medically stable to d/c. ?  Difficult to place patient No ? ? ? ? ?Infusions:  ? sodium chloride Stopped (01/19/22 0854)  ? sodium chloride    ? ? ?Scheduled Meds: ? arformoterol  15 mcg Nebulization BID  ? budesonide (PULMICORT) nebulizer solution  0.5 mg Nebulization BID  ? Chlorhexidine Gluconate Cloth  6 each Topical Q0600  ? doxycycline  100 mg Oral Q12H  ? enoxaparin (LOVENOX) injection  40 mg Subcutaneous Q24H  ? insulin aspart  0-15 Units Subcutaneous Q4H  ? mouth rinse  15 mL  Mouth Rinse BID  ? metoprolol tartrate  25 mg Oral BID  ? predniSONE  40 mg Oral Q breakfast  ? revefenacin  175 mcg Nebulization Daily  ? rosuvastatin  40 mg Oral Daily  ? tamsulosin  0.4 mg Oral Daily  ? ? ?PRN meds: ?acetaminophen, albuterol  ? ?Antimicrobials: ?Anti-infectives (From admission, onward)  ? ? Start     Dose/Rate Route Frequency Ordered Stop  ? 01/17/22 2200  doxycycline (VIBRA-TABS) tablet 100 mg       ? 100 mg Oral Every 12 hours 01/17/22 1337 01/20/22 0959  ? 01/17/22 1000  doxycycline (VIBRA-TABS) tablet 100 mg  Status:  Discontinued       ? 100 mg Per Tube Every 12 hours 01/17/22 0831 01/17/22 1337  ? 01/15/22 1145  levofloxacin (LEVAQUIN) IVPB 500 mg  Status:  Discontinued       ? 500 mg ?100 mL/hr over 60 Minutes Intravenous Every 24 hours 01/15/22 1138 01/17/22 1310  ? ?  ? ? ?Objective: ?Vitals:  ? 01/19/22 1400 01/19/22 1500  ?BP: 116/65 138/66  ?Pulse: 94 97  ?Resp: (!) 27 (!) 26  ?Temp:    ?SpO2: 100% 100%  ? ? ?Intake/Output Summary (Last 24 hours) at 01/19/2022 1519 ?Last data filed at 01/19/2022 1500 ?Gross per 24 hour  ?Intake 2714.13 ml  ?Output 6000 ml  ?Net -3285.87 ml  ? ?There were no vitals filed for this visit. ?Weight change:  ?Body mass index is 19.96 kg/m?.  ? ?Physical Exam: ?General exam: Pleasant, elderly African-American male.  Not in distress.  ?Skin: No rashes, lesions or ulcers. ?HEENT: Atraumatic, normocephalic, no obvious bleeding ?Lungs: Has crackles up to the right midlung.  Clear on the left. ?CVS: Regular rate and rhythm, no murmur ?GI/Abd soft, nontender, nondistended, bowel sound present ?CNS: Alert, awake, oriented to place and person ?Psychiatry: Mood appropriate ?Extremities: No pedal edema, no calf tenderness ? ?Data Review: I have personally reviewed the laboratory data and studies available. ? ?F/u labs  ?Unresulted Labs (From admission, onward)  ? ?  Start     Ordered  ? 01/17/22 7939  Basic metabolic panel  Daily,   R     ?Question:  Specimen collection  method  Answer:  Lab=Lab collect  ? 01/16/22 0819  ? 01/17/22 0500  Magnesium  Daily,   R     ?Question:  Specimen collection method  Answer:  Lab=Lab collect  ? 01/16/22 0819  ? 01/17/22 0500  Phosphorus  Daily,   R     ?Question:  Specimen collection method  Answer:  Lab=Lab collect  ? 01/16/22 0819  ? 01/15/22 1141  Culture, Respiratory w Gram Stain  Once,   R       ? 01/15/22 1141  ? ?  ?  ? ?  ? ? ?  Signed, ?Terrilee Croak, MD ?[facility] ?01/19/2022 ? ? ? ? ? ? ? ? ? ? ? ? ?

## 2022-01-19 NOTE — Progress Notes (Signed)
Physical Therapy Treatment ?Patient Details ?Name: Lucas [name] ?MRN: [id number] ?DOB: 1951/05/03 ?Today's Date: 01/19/2022 ? ? ?History of Present Illness Pt adm 3/25 with acute on chronic respiratory failure with hypoxia and hypercapnia from COPD exacerbation and possible aspiration pneumonitis. Pt intubated in ED and extubated 3/27. PMH - copd, chronic resp failure on 3L O2, HTN, etoh, lung CA, CABG, rt tibial fx, lt tibial fx ? ?  ?PT Comments  ? ? Pt reports SoB all day today. SpO2 on 3L O2 via [place] at rest and throughout ambulation >93%O2. Pt HR also increased to max noted 147bpm with laughing during ambulation, reduced with standing rest break to 130s and continued ambulation. Pt is mod I for bed mobility,  and supervision for transfers and ambulation with RW. Pt requires cuing to avoid obstacles on R. D/c plans remain appropriate. PT will continue to follow acutely.  ?   ?Recommendations for follow up therapy are one component of a multi-disciplinary discharge planning process, led by the attending physician.  Recommendations may be updated based on patient status, additional functional criteria and insurance authorization. ? ?Follow Up Recommendations ? Home health PT ?  ?  ?Assistance Recommended at Discharge Intermittent Supervision/Assistance  ?Patient can return home with the following A little help with walking and/or transfers;Help with stairs or ramp for entrance;Assist for transportation;Assistance with cooking/housework;A little help with bathing/dressing/bathroom ?  ?Equipment Recommendations ? None recommended by PT  ?  ?Recommendations for Other Services   ? ? ?  ?Precautions / Restrictions Precautions ?Precautions: Fall;Other (comment) ?Precaution Comments: watch SpO2 ?Restrictions ?Weight Bearing Restrictions: No  ?  ? ?Mobility ? Bed Mobility ?Overal bed mobility: Modified Independent ?  ?  ?  ?  ?  ?  ?General bed mobility comments: sitting EoB on entry, increased effort to bring LE back into bed  at end of session ?  ? ?Transfers ?Overall transfer level: Needs assistance ?Equipment used: Rollator (4 wheels) ?Transfers: Sit to/from Stand ?Sit to Stand: Supervision ?  ?  ?  ?  ?  ?General transfer comment: supervision for safety ?  ? ?Ambulation/Gait ?Ambulation/Gait assistance: Supervision ?Gait Distance (Feet): 300 Feet ?Assistive device: Rollator (4 wheels) ?Gait Pattern/deviations: Step-through pattern, Decreased stride length, Trunk flexed ?Gait velocity: decr ?Gait velocity interpretation: 1.31 - 2.62 ft/sec, indicative of limited community ambulator ?  ?General Gait Details: requires cuing for safety especially with obstacles on his R ? ? ?  ? ? ?  ?Balance Overall balance assessment: Needs assistance ?Sitting-balance support: No upper extremity supported, Feet supported ?Sitting balance-Leahy Scale: Good ?  ?  ?Standing balance support: No upper extremity supported, During functional activity ?Standing balance-Leahy Scale: Fair ?Standing balance comment: can stand statically without assist benefits from UE support for dynamic activity ?  ?  ?  ?  ?  ?  ?  ?  ?  ?  ?  ?  ? ?  ?Cognition Arousal/Alertness: Awake/alert ?Behavior During Therapy: [facility] for tasks assessed/performed ?Overall Cognitive Status: Within Functional Limits for tasks assessed ?  ?  ?  ?  ?  ?  ?  ?  ?  ?  ?  ?  ?  ?  ?  ?  ?  ?  ?  ? ?  ?   ?General Comments General comments (skin integrity, edema, etc.): Pt ambuled on 3 L O2 via [place], reports SoB, however able to maintain SpO2 >92%O2 with ambulation, HR 106 bpm at rest but increased to a max of  147bpm with laughing during gait, with standing rest break HR dropped back to 130s ?  ?  ? ?Pertinent Vitals/Pain Pain Assessment ?Pain Assessment: No/denies pain  ? ? ? ?PT Goals (current goals can now be found in the care plan section) Acute Rehab PT Goals ?Patient Stated Goal: stay out of hospital ?PT Goal Formulation: With patient ?Time For Goal Achievement: 01/25/22 ?Potential to Achieve  Goals: Good ?Progress towards PT goals: Progressing toward goals ? ?  ?Frequency ? ? ? Min 3X/week ? ? ? ?  ?PT Plan Current plan remains appropriate  ? ? ?   ?AM-PAC PT "6 Clicks" Mobility   ?Outcome Measure ? Help needed turning from your back to your side while in a flat bed without using bedrails?: None ?Help needed moving from lying on your back to sitting on the side of a flat bed without using bedrails?: None ?Help needed moving to and from a bed to a chair (including a wheelchair)?: A Little ?Help needed standing up from a chair using your arms (e.g., wheelchair or bedside chair)?: A Little ?Help needed to walk in hospital room?: A Little ?Help needed climbing 3-5 steps with a railing? : A Little ?6 Click Score: 20 ? ?  ?End of Session Equipment Utilized During Treatment: Oxygen ?Activity Tolerance: Patient tolerated treatment well ?Patient left: in bed;with call bell/phone within reach;with bed alarm set ?Nurse Communication: Mobility status ?PT Visit Diagnosis: Other abnormalities of gait and mobility (R26.89);Unsteadiness on feet (R26.81) ?  ? ? ?Time: 8182-9937 ?PT Time Calculation (min) (ACUTE ONLY): 23 min ? ?Charges:  $Gait Training: 8-22 mins ?$Therapeutic Exercise: 8-22 mins          ?          ? ?Xavier Fournier B. Migdalia Dk PT, DPT ?Acute Rehabilitation Services ?Pager [phone] ?Office [phone] ? ? ? ?[place] ?01/19/2022, 4:14 PM ? ?

## 2022-01-19 NOTE — Progress Notes (Signed)
Pt refused lovenox injection. Pt states "it cause my nose to bleed". I informed pt the important of this medication is to prevent blood clots and that he is receiving a low dose. Pt still refuse. Angie Fava MD notified. No new orders given at this time.  ?

## 2022-01-20 LAB — GLUCOSE, CAPILLARY
Glucose-Capillary: 85 mg/dL (ref 70–99)
Glucose-Capillary: 85 mg/dL (ref 70–99)
Glucose-Capillary: 89 mg/dL (ref 70–99)
Glucose-Capillary: 170 mg/dL — ABNORMAL HIGH (ref 70–99)

## 2022-01-20 LAB — BASIC METABOLIC PANEL
Anion gap: 7 (ref 5–15)
BUN: 12 mg/dL (ref 8–23)
CO2: 30 mmol/L (ref 22–32)
Calcium: 8.9 mg/dL (ref 8.9–10.3)
Chloride: 101 mmol/L (ref 98–111)
Creatinine, Ser: 0.81 mg/dL (ref 0.61–1.24)
GFR, Estimated: >60 mL/min (ref >60)
Glucose, Bld: 95 mg/dL (ref 70–99)
Potassium: 3.7 mmol/L (ref 3.5–5.1)
Sodium: 138 mmol/L (ref 135–145)

## 2022-01-20 LAB — MAGNESIUM: Magnesium: 1.7 mg/dL (ref 1.7–2.4)

## 2022-01-20 LAB — PHOSPHORUS: Phosphorus: 3.8 mg/dL (ref 2.5–4.6)

## 2022-01-20 MED ORDER — PREDNISONE 10 MG PO TABS: ORAL_TABLET | ORAL | 0 refills | Status: AC

## 2022-01-20 MED ORDER — TAMSULOSIN HCL 0.4 MG PO CAPS: 0.4000 mg | ORAL_CAPSULE | Freq: Every day | ORAL | 0 refills | Status: AC

## 2022-01-20 MED ORDER — ALBUTEROL SULFATE HFA 108 (90 BASE) MCG/ACT IN AERS: INHALATION_SPRAY | RESPIRATORY_TRACT | 2 refills | Status: DC

## 2022-01-20 NOTE — TOC Transition Note (Signed)
Transition of Care (TOC) - CM/SW Discharge Note ? ? ?Patient Details  ?Name: Lucas [name] ?MRN: [id number] ?Date of Birth: 12-01-1950 ? ?Transition of Care (TOC) CM/SW Contact:  ?Cyndi Bender, RN ?Phone Number: ?01/20/2022, 11:43 AM ? ? ?Clinical Narrative:    ?Patient stable for discharge. ?Patient active with [facility]. Resumption orders for HH-PT/OT/RN/SW. Spoke to [place] with [facility] and referral accepted. Patient currently has 3L  home 02 with [employer]. Neighbor can transport patient home if discharged before dark. ?Patient states he takes uber to apts when needed. ?Patient states he can afford his medications.  ? ?Final next level of care: [place] ?Barriers to Discharge: Barriers Resolved ? ? ?Patient Goals and CMS Choice ?Patient states their goals for this hospitalization and ongoing recovery are:: return home ?  ?Choice offered to / list presented to : Patient ? ?Discharge Placement ?  ?           ?  ? home ?  ?  ? ?Discharge Plan and Services ?  ?Discharge Planning Services: CM Consult ?Post Acute Care Choice: Home Health          ?  ?  ?  ?  ?  ?HH Arranged: RN, PT, OT, Social Work ?[place] Agency: [place] ?Date HH Agency Contacted: 01/20/22 ?Time [place]: [id number] ?Representative spoke with at [place]: Marjory Lies ? ?Social Determinants of Health (SDOH) Interventions ?  ? ? ?Readmission Risk Interventions ? ?  01/20/2022  ? 11:43 AM 10/01/2020  ? 11:10 AM 09/10/2020  ?  9:56 AM  ?Readmission Risk Prevention Plan  ?Transportation Screening Complete Complete Complete  ?PCP or Specialist Appt within 5-7 Days Complete    ?Home Care Screening Complete    ?Medication Review (RN CM) Complete    ?PCP or Specialist appointment within 3-5 days of discharge  Complete Complete  ?[place] or Home Care Consult  Complete Patient refused  ?SW Recovery Care/Counseling Consult  Complete Complete  ?Palliative Care Screening  Not Applicable Not Applicable  ?[place]  Not  Applicable Not Applicable  ? ? ? ? ? ?

## 2022-01-20 NOTE — Progress Notes (Signed)
?  Transition of Care (TOC) Screening Note ? ? ?Patient Details  ?Name: Lucas [name] ?Date of Birth: 10-10-1951 ? ? ?Transition of Care (TOC) CM/SW Contact:    ?Cyndi Bender, RN ?Phone Number: ?01/20/2022, 8:11 AM ? ? ? ?Transition of Care Department [facility]) has reviewed patient and no TOC needs have been identified at this time. We will continue to monitor patient advancement through interdisciplinary progression rounds. If new patient transition needs arise, please place a TOC consult. ? ? ?

## 2022-01-20 NOTE — Plan of Care (Signed)

## 2022-01-20 NOTE — Progress Notes (Signed)
Patient is alert and orientated. Discharge instructions discussed and reviewed with the patient, understanding voiced. ?

## 2022-01-20 NOTE — Discharge Summary (Signed)
? ?Physician Discharge Summary  ?Lucas [name] PYP:[id number] DOB: October 14, 1951 DOA: 01/15/2022 ? ?PCP: [name], DO ? ?Admit date: 01/15/2022 ?Discharge date: 01/20/2022 ? ?Admitted From: Home ?Discharge disposition: Home with home health ? ?Recommendations at discharge:  ?Complete the tapering course of steroids.   ?Stop smoking ? ? ?Brief narrative: ?Lucas [name] is a 71 y.o. male with PMH significant for HTN, HLD, chronic alcoholic, chronic smoker, COPD on 3 L oxygen at home, stage Ia squamous cell cancer of lung s/p radiation, GI bleeding, bilateral AV necrosis of femurs. ?Patient was brought by EMS to the ED on 3/25 with complaint of dyspnea, diaphoresis, hypoxia with oxygen saturation down to 70%, altered mental status. ?He was intubated in the ED.   ?Admitted to the ICU for COPD exacerbation.  He was admitted earlier this month for the same reason.   ?He was noted to have bedbug infestation. ?3/27, patient was extubated, transferred out to [facility] service ? ?Subjective: ?Patient was seen and examined this morning.   ?Sitting up in bed.  Not in distress.  On 3 L oximeter, which is at baseline.  No crackles today.  Was given Lasix yesterday and was able to walk on the unit. ? ?Principal Problem: ?  COPD exacerbation ([place]) ?Active Problems: ?  Pressure injury of skin ?  ?Assessment and Plan: ?Acute on chronic respiratory failure with hypoxia and hypercapnia  ?COPD exacerbation ?Chronic daily smoker ?-Intubated in the ED.  Extubated on 3/27  ?-Currently on 3 L oxygen at rest which is his baseline requirement.  Getting hypoxic on ambulation today. ?-Strep antigen negative ?-Course of Doxidan completed. ?-Continue tapering course of oral prednisone. ?-LABA/LAMA/ICS nebs ?-tobacco cessation counseling done. ? ?Volume overload ?-Patient seems volume overloaded yesterday.  He had shortness of breath on exertion, crackles on auscultation and very light color urine. IV fluid was stopped.  Given 1 dose of Lasix IV 20 mg.   He was able to ambulate without shortness of breath later in the afternoon ?  ?Acute metabolic encephalopathy from hypoxia/hypercapnia.   ?-CT head unremarkable ?-Mental status improved ? ?Hx of anxiety. ?-Hydroxyzine as needed. ?   ?Bed bug infestation. ?-Patient states he has been in process to get an exterminator at home to deal with that. ?  ?Hx of CAD s/p CABG, HLD ?-continue crestor ? ?Hypertension ?-Continue metoprolol ? ?PAF ?-Heart rate stable on metoprolol ? ?Urine retention ?-Initially required Foley catheter.  Passed voiding trial yesterday. ? ?stage Ia squamous cell cancer of lung s/p radiation ?-Patient has right upper lobe spiculated nodule.  Continue to follow-up as an outpatient. ? ? ?Wounds:  ?- ?Pressure Injury 01/15/22 Sacrum Mid Stage 2 -  Partial thickness loss of dermis presenting as a shallow open injury with a red, pink wound bed without slough. (Active)  ?Date First Assessed/Time First Assessed: 01/15/22 1300   Location: Sacrum  Location Orientation: Mid  Staging: Stage 2 -  Partial thickness loss of dermis presenting as a shallow open injury with a red, pink wound bed without slough.  Present on Admis...  ?  ?Assessments 01/15/2022  8:00 PM 01/19/2022  8:00 PM  ?Dressing Type Foam - Lift dressing to assess site every shift Foam - Lift dressing to assess site every shift  ?Dressing Clean, Dry, Intact Clean, Dry, Intact  ?Site / Wound Assessment Dressing in place / Unable to assess --  ?   ?No Linked orders to display  ? ? ?Discharge Exam:  ? ?Vitals:  ? 01/20/22 0743 01/20/22  4403 01/20/22 0825 01/20/22 4742  ?BP:   132/81   ?Pulse:   80   ?Resp:   (!) 21   ?Temp:   97.9 ?F (36.6 ?C)   ?TempSrc:   Oral   ?SpO2: 100% 100% (!) 4% 100%  ?Weight:      ?Height:      ? ? ?Body mass index is 21.19 kg/m?.  ? ?General exam: Pleasant, elderly African-American male.  Not in distress.  ?Skin: No rashes, lesions or ulcers. ?HEENT: Atraumatic, normocephalic, no obvious bleeding ?Lungs: No crackles.  Mild  end expiratory wheezing scattered  ?CVS: Regular rate and rhythm, no murmur ?GI/Abd soft, nontender, nondistended, bowel sound present ?CNS: Alert, awake, oriented to place and person ?Psychiatry: Mood appropriate ?Extremities: No pedal edema, no calf tenderness ? ?Follow ups:  ? ? Follow-up Information   ? ? [facility] transport Follow up.   ?Why: If you need transport to appointments ?Contact information: ?[phone] ? ?  ?  ? ? Brimage, Ronnette Juniper, DO. Schedule an appointment as soon as possible for a visit in 1 week(s).   ?Specialty: Family Medicine ?Why: Hospital follow up ?Contact information: ?[address] [place] [address] ?[phone] ? ? ?  ?  ? ? Martinique, Peter M, MD .   ?Specialty: Cardiology ?Contact information: ?[address] ?[address] ?[place] [address] ?[phone] ? ? ?  ?  ? ? [place] Follow up.   ?Contact information: ?[address] ?[place] [address] ?[phone] ? ?  ?  ? ?  ?  ? ?  ? ? ?Discharge Instructions:  ? ?Discharge Instructions   ? ? Call MD for:  difficulty breathing, headache or visual disturbances   Complete by: As directed ?  ? Call MD for:  extreme fatigue   Complete by: As directed ?  ? Call MD for:  hives   Complete by: As directed ?  ? Call MD for:  persistant dizziness or light-headedness   Complete by: As directed ?  ? Call MD for:  persistant nausea and vomiting   Complete by: As directed ?  ? Call MD for:  severe uncontrolled pain   Complete by: As directed ?  ? Call MD for:  temperature >100.4   Complete by: As directed ?  ? Diet general   Complete by: As directed ?  ? Discharge instructions   Complete by: As directed ?  ? Recommendations at discharge:  ?? Complete the tapering course of steroids.   ?? Stop smoking ? ? ?General discharge instructions: ?Follow with Primary MD [name], DO in 7 days  ?Please request your PCP  to go over your hospital tests, procedures, radiology results at  the follow up. Please get your medicines reviewed and adjusted.  Your PCP may decide to repeat certain labs or tests as needed. ?Do not drive, operate heavy machinery, perform activities at heights, swimming or participation in water activities or provide baby sitting services if your were admitted for syncope or siezures until you have seen by Primary MD or a Neurologist and advised to do so again. ?[place] Controlled Substance Reporting System database was reviewed. Do not drive, operate heavy machinery, perform activities at heights, swim, participate in water activities or provide baby-sitting services while on medications for pain, sleep and mood until your outpatient physician has reevaluated you and advised to do so again.  You are strongly recommended to comply with the dose, frequency  and duration of prescribed medications. ?Activity: As tolerated with Full fall precautions use walker/cane & assistance as needed ?Avoid using any recreational substances like cigarette, tobacco, alcohol, or non-prescribed drug. ?If you experience worsening of your admission symptoms, develop shortness of breath, life threatening emergency, suicidal or homicidal thoughts you must seek medical attention immediately by calling 911 or calling your MD immediately  if symptoms less severe. ?You must read complete instructions/literature along with all the possible adverse reactions/side effects for all the medicines you take and that have been prescribed to you. Take any new medicine only after you have completely understood and accepted all the possible adverse reactions/side effects.  ?Wear Seat belts while driving. ?You were cared for by a hospitalist during your hospital stay. If you have any questions about your discharge medications or the care you received while you were in the hospital after you are discharged, you can call the unit and ask to speak with the hospitalist or the covering physician. Once you are  discharged, your primary care physician will handle any further medical issues. Please note that NO REFILLS for any discharge medications will be authorized once you are discharged, as it is imperative that you

## 2022-01-20 NOTE — Progress Notes (Signed)
Patients O2@ saturation maintained at 94 % while ambulating in hall with staff and O2 via nasal cannula @ 3LPM. ?

## 2022-01-20 NOTE — Consult Note (Signed)
? ?  [facility] CM Inpatient Consult ? ? ?01/20/2022 ? ?Lucas [name] ?August 15, 1951 ?[id number] ? ?[facility] [[facility]] Patient: [name] ? ?Primary Care Provider:  [name], DO, is an embedded provider with a Chronic Care Management team and program, and is listed for the transition of care follow up and appointments. ? ?Patient was screened for Embedded practice service needs for chronic care management ? ?Plan: Updated the [place] Management RN of anticipated transition home today.  ? ?Please contact for further questions, ? ?Natividad Brood, RN BSN CCM ?[facility] Liaison ? [phone] business mobile phone ?Toll free office [phone]  ?Fax number: [phone] ?[email or web address] ?[email or web address] ?  ? ?

## 2022-01-23 DIAGNOSIS — J449 Chronic obstructive pulmonary disease, unspecified: Secondary | ICD-10-CM | POA: Diagnosis not present

## 2022-01-23 DIAGNOSIS — I2510 Atherosclerotic heart disease of native coronary artery without angina pectoris: Secondary | ICD-10-CM | POA: Diagnosis not present

## 2022-01-23 DIAGNOSIS — J90 Pleural effusion, not elsewhere classified: Secondary | ICD-10-CM | POA: Diagnosis not present

## 2022-01-24 ENCOUNTER — Other Ambulatory Visit: Payer: Self-pay | Admitting: Family Medicine

## 2022-01-24 DIAGNOSIS — F419 Anxiety disorder, unspecified: Secondary | ICD-10-CM

## 2022-02-22 DIAGNOSIS — J449 Chronic obstructive pulmonary disease, unspecified: Secondary | ICD-10-CM | POA: Diagnosis not present

## 2022-02-22 DIAGNOSIS — J90 Pleural effusion, not elsewhere classified: Secondary | ICD-10-CM | POA: Diagnosis not present

## 2022-02-22 DIAGNOSIS — I2510 Atherosclerotic heart disease of native coronary artery without angina pectoris: Secondary | ICD-10-CM | POA: Diagnosis not present

## 2022-02-28 ENCOUNTER — Ambulatory Visit: Payer: Self-pay | Admitting: Radiation Oncology

## 2022-03-05 ENCOUNTER — Other Ambulatory Visit: Payer: Self-pay | Admitting: Family Medicine

## 2022-03-05 DIAGNOSIS — J449 Chronic obstructive pulmonary disease, unspecified: Secondary | ICD-10-CM

## 2022-03-22 ENCOUNTER — Telehealth: Payer: Self-pay | Admitting: *Deleted

## 2022-03-22 NOTE — Telephone Encounter (Signed)
CALLED PATIENT TO ASK ABOUT RESCHEDULING CT, LVM FOR A RETURN CALL

## 2022-03-25 DIAGNOSIS — J90 Pleural effusion, not elsewhere classified: Secondary | ICD-10-CM | POA: Diagnosis not present

## 2022-03-25 DIAGNOSIS — I2510 Atherosclerotic heart disease of native coronary artery without angina pectoris: Secondary | ICD-10-CM | POA: Diagnosis not present

## 2022-03-25 DIAGNOSIS — J449 Chronic obstructive pulmonary disease, unspecified: Secondary | ICD-10-CM | POA: Diagnosis not present

## 2022-04-18 ENCOUNTER — Other Ambulatory Visit: Payer: Self-pay | Admitting: Radiation Oncology

## 2022-04-18 ENCOUNTER — Telehealth: Payer: Self-pay | Admitting: *Deleted

## 2022-04-18 DIAGNOSIS — C3411 Malignant neoplasm of upper lobe, right bronchus or lung: Secondary | ICD-10-CM

## 2022-04-24 DIAGNOSIS — J90 Pleural effusion, not elsewhere classified: Secondary | ICD-10-CM | POA: Diagnosis not present

## 2022-04-24 DIAGNOSIS — J449 Chronic obstructive pulmonary disease, unspecified: Secondary | ICD-10-CM | POA: Diagnosis not present

## 2022-04-24 DIAGNOSIS — I2510 Atherosclerotic heart disease of native coronary artery without angina pectoris: Secondary | ICD-10-CM | POA: Diagnosis not present

## 2022-04-30 ENCOUNTER — Inpatient Hospital Stay (HOSPITAL_COMMUNITY): Payer: Medicare Other

## 2022-04-30 ENCOUNTER — Inpatient Hospital Stay (HOSPITAL_COMMUNITY)
Admission: EM | Admit: 2022-04-30 | Discharge: 2022-05-24 | DRG: 208 | Disposition: E | Payer: Medicare Other | Attending: Internal Medicine | Admitting: Internal Medicine

## 2022-04-30 ENCOUNTER — Emergency Department (HOSPITAL_COMMUNITY): Payer: Medicare Other

## 2022-04-30 DIAGNOSIS — I10 Essential (primary) hypertension: Secondary | ICD-10-CM | POA: Diagnosis present

## 2022-04-30 DIAGNOSIS — S2249XA Multiple fractures of ribs, unspecified side, initial encounter for closed fracture: Secondary | ICD-10-CM

## 2022-04-30 DIAGNOSIS — G9341 Metabolic encephalopathy: Secondary | ICD-10-CM | POA: Diagnosis not present

## 2022-04-30 DIAGNOSIS — Z743 Need for continuous supervision: Secondary | ICD-10-CM | POA: Diagnosis not present

## 2022-04-30 DIAGNOSIS — F17210 Nicotine dependence, cigarettes, uncomplicated: Secondary | ICD-10-CM | POA: Diagnosis not present

## 2022-04-30 DIAGNOSIS — Z880 Allergy status to penicillin: Secondary | ICD-10-CM

## 2022-04-30 DIAGNOSIS — C3411 Malignant neoplasm of upper lobe, right bronchus or lung: Secondary | ICD-10-CM | POA: Diagnosis not present

## 2022-04-30 DIAGNOSIS — Z515 Encounter for palliative care: Secondary | ICD-10-CM

## 2022-04-30 DIAGNOSIS — R569 Unspecified convulsions: Secondary | ICD-10-CM | POA: Diagnosis not present

## 2022-04-30 DIAGNOSIS — J9621 Acute and chronic respiratory failure with hypoxia: Secondary | ICD-10-CM | POA: Diagnosis present

## 2022-04-30 DIAGNOSIS — Z923 Personal history of irradiation: Secondary | ICD-10-CM | POA: Diagnosis not present

## 2022-04-30 DIAGNOSIS — Z825 Family history of asthma and other chronic lower respiratory diseases: Secondary | ICD-10-CM | POA: Diagnosis not present

## 2022-04-30 DIAGNOSIS — F1020 Alcohol dependence, uncomplicated: Secondary | ICD-10-CM | POA: Diagnosis present

## 2022-04-30 DIAGNOSIS — Z888 Allergy status to other drugs, medicaments and biological substances status: Secondary | ICD-10-CM

## 2022-04-30 DIAGNOSIS — I6381 Other cerebral infarction due to occlusion or stenosis of small artery: Secondary | ICD-10-CM | POA: Diagnosis not present

## 2022-04-30 DIAGNOSIS — R918 Other nonspecific abnormal finding of lung field: Secondary | ICD-10-CM | POA: Diagnosis present

## 2022-04-30 DIAGNOSIS — Z79899 Other long term (current) drug therapy: Secondary | ICD-10-CM

## 2022-04-30 DIAGNOSIS — I468 Cardiac arrest due to other underlying condition: Secondary | ICD-10-CM | POA: Diagnosis not present

## 2022-04-30 DIAGNOSIS — E785 Hyperlipidemia, unspecified: Secondary | ICD-10-CM | POA: Diagnosis present

## 2022-04-30 DIAGNOSIS — Z4682 Encounter for fitting and adjustment of non-vascular catheter: Secondary | ICD-10-CM | POA: Diagnosis not present

## 2022-04-30 DIAGNOSIS — G931 Anoxic brain damage, not elsewhere classified: Secondary | ICD-10-CM | POA: Diagnosis present

## 2022-04-30 DIAGNOSIS — I469 Cardiac arrest, cause unspecified: Secondary | ICD-10-CM

## 2022-04-30 DIAGNOSIS — J441 Chronic obstructive pulmonary disease with (acute) exacerbation: Secondary | ICD-10-CM | POA: Diagnosis present

## 2022-04-30 DIAGNOSIS — R001 Bradycardia, unspecified: Secondary | ICD-10-CM

## 2022-04-30 DIAGNOSIS — K7460 Unspecified cirrhosis of liver: Secondary | ICD-10-CM | POA: Diagnosis present

## 2022-04-30 DIAGNOSIS — Z885 Allergy status to narcotic agent status: Secondary | ICD-10-CM

## 2022-04-30 DIAGNOSIS — Z951 Presence of aortocoronary bypass graft: Secondary | ICD-10-CM | POA: Diagnosis not present

## 2022-04-30 DIAGNOSIS — I499 Cardiac arrhythmia, unspecified: Secondary | ICD-10-CM | POA: Diagnosis not present

## 2022-04-30 DIAGNOSIS — Z66 Do not resuscitate: Secondary | ICD-10-CM | POA: Diagnosis present

## 2022-04-30 DIAGNOSIS — M96A3 Multiple fractures of ribs associated with chest compression and cardiopulmonary resuscitation: Secondary | ICD-10-CM | POA: Diagnosis present

## 2022-04-30 DIAGNOSIS — R6889 Other general symptoms and signs: Secondary | ICD-10-CM | POA: Diagnosis not present

## 2022-04-30 DIAGNOSIS — J449 Chronic obstructive pulmonary disease, unspecified: Secondary | ICD-10-CM | POA: Diagnosis present

## 2022-04-30 DIAGNOSIS — Z7951 Long term (current) use of inhaled steroids: Secondary | ICD-10-CM

## 2022-04-30 DIAGNOSIS — R092 Respiratory arrest: Secondary | ICD-10-CM | POA: Diagnosis not present

## 2022-04-30 DIAGNOSIS — R404 Transient alteration of awareness: Secondary | ICD-10-CM | POA: Diagnosis not present

## 2022-04-30 DIAGNOSIS — G253 Myoclonus: Secondary | ICD-10-CM | POA: Diagnosis present

## 2022-04-30 DIAGNOSIS — I6782 Cerebral ischemia: Secondary | ICD-10-CM | POA: Diagnosis not present

## 2022-04-30 DIAGNOSIS — C3490 Malignant neoplasm of unspecified part of unspecified bronchus or lung: Secondary | ICD-10-CM | POA: Diagnosis not present

## 2022-04-30 DIAGNOSIS — J439 Emphysema, unspecified: Secondary | ICD-10-CM | POA: Diagnosis not present

## 2022-04-30 DIAGNOSIS — G9389 Other specified disorders of brain: Secondary | ICD-10-CM | POA: Diagnosis not present

## 2022-04-30 DIAGNOSIS — I517 Cardiomegaly: Secondary | ICD-10-CM | POA: Diagnosis not present

## 2022-04-30 LAB — COMPREHENSIVE METABOLIC PANEL
ALT: 73 U/L — ABNORMAL HIGH (ref 0–44)
AST: 185 U/L — ABNORMAL HIGH (ref 15–41)
Albumin: 3.4 g/dL — ABNORMAL LOW (ref 3.5–5.0)
Alkaline Phosphatase: 105 U/L (ref 38–126)
Anion gap: 22 — ABNORMAL HIGH (ref 5–15)
BUN: 8 mg/dL (ref 8–23)
CO2: 18 mmol/L — ABNORMAL LOW (ref 22–32)
Calcium: 9.1 mg/dL (ref 8.9–10.3)
Chloride: 99 mmol/L (ref 98–111)
Creatinine, Ser: 1.05 mg/dL (ref 0.61–1.24)
GFR, Estimated: >60 mL/min (ref >60)
Glucose, Bld: 165 mg/dL — ABNORMAL HIGH (ref 70–99)
Potassium: 4.7 mmol/L (ref 3.5–5.1)
Sodium: 139 mmol/L (ref 135–145)
Total Bilirubin: 0.5 mg/dL (ref 0.3–1.2)
Total Protein: 6.7 g/dL (ref 6.5–8.1)

## 2022-04-30 LAB — RAPID URINE DRUG SCREEN, HOSP PERFORMED
Amphetamines: NOT DETECTED
Barbiturates: NOT DETECTED
Benzodiazepines: POSITIVE — AB
Cocaine: NOT DETECTED
Opiates: NOT DETECTED
Tetrahydrocannabinol: NOT DETECTED

## 2022-04-30 LAB — CBC WITH DIFFERENTIAL/PLATELET
Abs Immature Granulocytes: 0.06 10*3/uL (ref 0.00–0.07)
Basophils Absolute: 0 10*3/uL (ref 0.0–0.1)
Basophils Relative: 1 %
Eosinophils Absolute: 0.1 10*3/uL (ref 0.0–0.5)
Eosinophils Relative: 2 %
HCT: 35.3 % — ABNORMAL LOW (ref 39.0–52.0)
Hemoglobin: 11 g/dL — ABNORMAL LOW (ref 13.0–17.0)
Immature Granulocytes: 2 %
Lymphocytes Relative: 42 %
Lymphs Abs: 1.6 10*3/uL (ref 0.7–4.0)
MCH: 31 pg (ref 26.0–34.0)
MCHC: 31.2 g/dL (ref 30.0–36.0)
MCV: 99.4 fL (ref 80.0–100.0)
Monocytes Absolute: 0.3 10*3/uL (ref 0.1–1.0)
Monocytes Relative: 7 %
Neutro Abs: 1.8 10*3/uL (ref 1.7–7.7)
Neutrophils Relative %: 46 %
Platelets: 148 10*3/uL — ABNORMAL LOW (ref 150–400)
RBC: 3.55 MIL/uL — ABNORMAL LOW (ref 4.22–5.81)
RDW: 12.7 % (ref 11.5–15.5)
WBC: 3.9 10*3/uL — ABNORMAL LOW (ref 4.0–10.5)
nRBC: 0 % (ref 0.0–0.2)

## 2022-04-30 LAB — I-STAT ARTERIAL BLOOD GAS, ED
Acid-base deficit: 2 mmol/L (ref 0.0–2.0)
Bicarbonate: 23.3 mmol/L (ref 20.0–28.0)
Calcium, Ion: 1.16 mmol/L (ref 1.15–1.40)
HCT: 30 % — ABNORMAL LOW (ref 39.0–52.0)
Hemoglobin: 10.2 g/dL — ABNORMAL LOW (ref 13.0–17.0)
O2 Saturation: 98 %
Patient temperature: 94.7
Potassium: 4.3 mmol/L (ref 3.5–5.1)
Sodium: 133 mmol/L — ABNORMAL LOW (ref 135–145)
TCO2: 24 mmol/L (ref 22–32)
pCO2 arterial: 35.4 mmHg (ref 32–48)
pH, Arterial: 7.417 (ref 7.35–7.45)
pO2, Arterial: 99 mmHg (ref 83–108)

## 2022-04-30 LAB — TROPONIN I (HIGH SENSITIVITY)
Troponin I (High Sensitivity): 114 ng/L (ref <18)
Troponin I (High Sensitivity): 232 ng/L (ref <18)

## 2022-04-30 LAB — ETHANOL: Alcohol, Ethyl (B): <10 mg/dL (ref <10)

## 2022-04-30 LAB — ACETAMINOPHEN LEVEL: Acetaminophen (Tylenol), Serum: <10 ug/mL — ABNORMAL LOW (ref 10–30)

## 2022-04-30 LAB — MRSA NEXT GEN BY PCR, NASAL: MRSA by PCR Next Gen: NOT DETECTED

## 2022-04-30 LAB — SALICYLATE LEVEL: Salicylate Lvl: <7 mg/dL — ABNORMAL LOW (ref 7.0–30.0)

## 2022-04-30 LAB — GLUCOSE, CAPILLARY
Glucose-Capillary: 154 mg/dL — ABNORMAL HIGH (ref 70–99)
Glucose-Capillary: 146 mg/dL — ABNORMAL HIGH (ref 70–99)

## 2022-04-30 LAB — D-DIMER, QUANTITATIVE: D-Dimer, Quant: 11.1 ug/mL-FEU — ABNORMAL HIGH (ref 0.00–0.50)

## 2022-04-30 LAB — LIPASE, BLOOD: Lipase: 71 U/L — ABNORMAL HIGH (ref 11–51)

## 2022-04-30 MED ORDER — IPRATROPIUM-ALBUTEROL 0.5-2.5 (3) MG/3ML IN SOLN
3.0000 mL | Freq: Four times a day (QID) | RESPIRATORY_TRACT | Status: DC
  Administered 2022-04-30 – 2022-05-02 (×8): 3 mL via RESPIRATORY_TRACT
  Filled 2022-04-30 (×8): qty 3

## 2022-04-30 MED ORDER — FENTANYL 2500MCG IN NS 250ML (10MCG/ML) PREMIX INFUSION
25.0000 ug/h | INTRAVENOUS | Status: DC
  Administered 2022-04-30: 25 ug/h via INTRAVENOUS

## 2022-04-30 MED ORDER — FENTANYL CITRATE PF 50 MCG/ML IJ SOSY
100.0000 ug | PREFILLED_SYRINGE | Freq: Once | INTRAMUSCULAR | Status: AC
  Administered 2022-04-30: 100 ug via INTRAVENOUS
  Filled 2022-04-30: qty 2

## 2022-04-30 MED ORDER — HEPARIN SODIUM (PORCINE) 5000 UNIT/ML IJ SOLN
5000.0000 [IU] | Freq: Three times a day (TID) | INTRAMUSCULAR | Status: DC
  Administered 2022-04-30 – 2022-05-02 (×6): 5000 [IU] via SUBCUTANEOUS
  Filled 2022-04-30 (×6): qty 1

## 2022-04-30 MED ORDER — DOCUSATE SODIUM 50 MG/5ML PO LIQD
100.0000 mg | Freq: Two times a day (BID) | ORAL | Status: DC
Start: 2022-04-30 — End: 2022-05-02
  Administered 2022-04-30 – 2022-05-02 (×4): 100 mg
  Filled 2022-04-30 (×4): qty 10

## 2022-04-30 MED ORDER — IPRATROPIUM-ALBUTEROL 0.5-2.5 (3) MG/3ML IN SOLN
3.0000 mL | Freq: Once | RESPIRATORY_TRACT | Status: AC
  Administered 2022-04-30: 3 mL via RESPIRATORY_TRACT
  Filled 2022-04-30: qty 3

## 2022-04-30 MED ORDER — ATROPINE SULFATE 1 MG/10ML IJ SOSY
1.0000 mg | PREFILLED_SYRINGE | Freq: Once | INTRAMUSCULAR | Status: AC
  Administered 2022-04-30: 1 mg via INTRAVENOUS
  Filled 2022-04-30: qty 10

## 2022-04-30 MED ORDER — ACETAMINOPHEN 650 MG RE SUPP: 650.0000 mg | RECTAL | Status: DC

## 2022-04-30 MED ORDER — EPINEPHRINE HCL 5 MG/250ML IV SOLN IN NS
0.5000 ug/min | INTRAVENOUS | Status: DC
  Administered 2022-04-30: 2 ug/min via INTRAVENOUS
  Filled 2022-04-30: qty 250

## 2022-04-30 MED ORDER — LEVETIRACETAM IN NACL 1000 MG/100ML IV SOLN
1000.0000 mg | Freq: Two times a day (BID) | INTRAVENOUS | Status: DC
  Administered 2022-04-30 – 2022-05-02 (×4): 1000 mg via INTRAVENOUS
  Filled 2022-04-30 (×4): qty 100

## 2022-04-30 MED ORDER — PROPOFOL 1000 MG/100ML IV EMUL
5.0000 ug/kg/min | INTRAVENOUS | Status: DC
  Administered 2022-04-30: 10 ug/kg/min via INTRAVENOUS

## 2022-04-30 MED ORDER — METHYLPREDNISOLONE SODIUM SUCC 125 MG IJ SOLR
80.0000 mg | Freq: Every day | INTRAMUSCULAR | Status: DC
  Administered 2022-04-30 – 2022-05-02 (×3): 80 mg via INTRAVENOUS
  Filled 2022-04-30 (×4): qty 2

## 2022-04-30 MED ORDER — IOHEXOL 350 MG/ML SOLN
100.0000 mL | Freq: Once | INTRAVENOUS | Status: AC | PRN
  Administered 2022-04-30: 100 mL via INTRAVENOUS

## 2022-04-30 MED ORDER — FENTANYL 2500MCG IN NS 250ML (10MCG/ML) PREMIX INFUSION
0.0000 ug/h | INTRAVENOUS | Status: DC
  Administered 2022-04-30: 25 ug/h via INTRAVENOUS
  Filled 2022-04-30: qty 250

## 2022-04-30 MED ORDER — PROPOFOL 1000 MG/100ML IV EMUL
0.0000 ug/kg/min | INTRAVENOUS | Status: DC
  Administered 2022-04-30: 50 ug/kg/min via INTRAVENOUS
  Administered 2022-04-30: 12 ug/kg/min via INTRAVENOUS
  Administered 2022-05-01 – 2022-05-02 (×5): 50 ug/kg/min via INTRAVENOUS
  Filled 2022-04-30 (×8): qty 100

## 2022-04-30 MED ORDER — LORAZEPAM 2 MG/ML IJ SOLN
INTRAMUSCULAR | Status: AC
  Administered 2022-04-30: 2 mg
  Filled 2022-04-30: qty 1

## 2022-04-30 MED ORDER — MAGNESIUM SULFATE 2 GM/50ML IV SOLN: 2.0000 g | Freq: Once | INTRAVENOUS | Status: DC | PRN

## 2022-04-30 MED ORDER — METHYLPREDNISOLONE SODIUM SUCC 125 MG IJ SOLR
125.0000 mg | Freq: Once | INTRAMUSCULAR | Status: AC
  Administered 2022-04-30: 125 mg via INTRAVENOUS
  Filled 2022-04-30: qty 2

## 2022-04-30 MED ORDER — ACETAMINOPHEN 160 MG/5ML PO SOLN
650.0000 mg | ORAL | Status: DC | PRN
  Administered 2022-05-02: 650 mg
  Filled 2022-04-30: qty 20.3

## 2022-04-30 MED ORDER — FENTANYL CITRATE (PF) 100 MCG/2ML IJ SOLN
25.0000 ug | Freq: Once | INTRAMUSCULAR | Status: AC
  Administered 2022-04-30: 25 ug via INTRAVENOUS
  Filled 2022-04-30: qty 2

## 2022-04-30 MED ORDER — SODIUM CHLORIDE 0.9 % IV BOLUS
1000.0000 mL | Freq: Once | INTRAVENOUS | Status: AC
  Administered 2022-04-30: 1000 mL via INTRAVENOUS

## 2022-04-30 MED ORDER — FENTANYL CITRATE (PF) 100 MCG/2ML IJ SOLN
INTRAMUSCULAR | Status: AC
  Administered 2022-04-30: 100 ug
  Filled 2022-04-30: qty 2

## 2022-04-30 MED ORDER — FENTANYL CITRATE PF 50 MCG/ML IJ SOSY
25.0000 ug | PREFILLED_SYRINGE | Freq: Once | INTRAMUSCULAR | Status: DC
  Filled 2022-04-30: qty 1

## 2022-04-30 MED ORDER — ACETAMINOPHEN 325 MG PO TABS: 650.0000 mg | ORAL_TABLET | ORAL | Status: DC

## 2022-04-30 MED ORDER — SODIUM CHLORIDE 0.9 % IV SOLN
3.0000 g | Freq: Three times a day (TID) | INTRAVENOUS | Status: DC
  Administered 2022-04-30 – 2022-05-02 (×6): 3 g via INTRAVENOUS
  Filled 2022-04-30 (×5): qty 8

## 2022-04-30 MED ORDER — ACETAMINOPHEN 325 MG PO TABS: 650.0000 mg | ORAL_TABLET | ORAL | Status: DC | PRN

## 2022-04-30 MED ORDER — POLYETHYLENE GLYCOL 3350 17 G PO PACK
17.0000 g | PACK | Freq: Every day | ORAL | Status: DC
Start: 2022-04-30 — End: 2022-05-02
  Administered 2022-04-30 – 2022-05-02 (×3): 17 g
  Filled 2022-04-30 (×3): qty 1

## 2022-04-30 MED ORDER — ACETAMINOPHEN 160 MG/5ML PO SOLN
650.0000 mg | ORAL | Status: DC
  Administered 2022-04-30 – 2022-05-02 (×6): 650 mg
  Filled 2022-04-30 (×6): qty 20.3

## 2022-04-30 MED ORDER — ACETAMINOPHEN 650 MG RE SUPP: 650.0000 mg | RECTAL | Status: DC | PRN

## 2022-04-30 MED ORDER — MIDAZOLAM-SODIUM CHLORIDE 100-0.9 MG/100ML-% IV SOLN
0.5000 mg/h | INTRAVENOUS | Status: DC
  Administered 2022-04-30: 0.5 mg/h via INTRAVENOUS
  Filled 2022-04-30: qty 100

## 2022-04-30 MED ORDER — SODIUM CHLORIDE 0.9 % IV SOLN
250.0000 mL | INTRAVENOUS | Status: DC
  Administered 2022-04-30: 250 mL via INTRAVENOUS

## 2022-04-30 MED ORDER — FENTANYL BOLUS VIA INFUSION: 25.0000 ug | INTRAVENOUS | Status: DC | PRN

## 2022-04-30 MED ORDER — ONDANSETRON HCL 4 MG/2ML IJ SOLN: 4.0000 mg | Freq: Four times a day (QID) | INTRAMUSCULAR | Status: DC | PRN

## 2022-04-30 MED ORDER — PANTOPRAZOLE 2 MG/ML SUSPENSION
40.0000 mg | Freq: Every day | ORAL | Status: DC
  Administered 2022-05-01 – 2022-05-02 (×2): 40 mg
  Filled 2022-04-30 (×2): qty 20

## 2022-04-30 NOTE — ED Notes (Signed)
Pt soiled and found to have bed bugs.  Linen changed. Pt cleaned with CHG wipes.

## 2022-04-30 NOTE — H&P (Signed)
NAME:  Lucas [name], MRN:  [id number], DOB:  1951-06-02, LOS: 0 ADMISSION DATE:  05/10/2022, CONSULTATION DATE:  05/20/2022 REFERRING MD: Dr Pearline Cables , CHIEF COMPLAINT: cardiac arrest    History of Present Illness:   71 yo M, PMH HTN, HLD, COPD, active tobacco use. Stage 1 squamous cell carcinoma RUL s/p SBRT (he is my clinic patient). Today the patient was having trouble breathing at home. Per the patients wife he has been having trouble with multiple spells per day. Increased shortness of breath. They finally made the decision to call EMS. Once EMS arrived he was found in respiratory distress, he became bradycardic, and ultimately suffered PEA cardiac arrest. CPR was started. He was placed on a lucas device. He received 30+ mins of chest compressions followed by ROSC.   I met with the patient family in the ED. He wife is obviously very distraught. She states that he would on want to be on life support or any of these things. She would like to at least make him a DNR and give him some time. She is prepared herself and her family that he may no survive this.   In the ED, the patients BP is stable off pressors. He is lightly sedated on propofol.   Pertinent  Medical History   Past Medical History:  Diagnosis Date   Acute blood loss anemia 06/21/2018   Acute upper GI bleed 06/21/2018   Allergy    Bilateral AVN of femurs ([place]) 06/25/2018   See 11/2017 Chest CT report   COPD 12/21/2006   Environmental allergies    History of femur fracture 06/25/2018   11/2017 CT AP showed Remote nonunited right femur greater trochanter fracture   History of Fracture of right tibia 06/26/2018   History of multiple right thoacic rib fractures 06/25/2018   History of Right clavicular fracture 06/25/2018   HYPERLIPIDEMIA 12/21/2006   HYPERTENSION, BENIGN SYSTEMIC 12/21/2006   Mallory-Weiss tear    TOBACCO DEPENDENCE 12/21/2006     Significant Hospital Events: Including procedures, antibiotic start and stop dates in addition to  other pertinent events     Interim History / Subjective:   Critically ill. Intubated on life support   Objective   Blood pressure 120/73, pulse 95, temperature (!) 96 F (35.6 C), resp. rate 18, height 5' 8"  (1.727 m), weight 65 kg, SpO2 95 %.    Vent Mode: PRVC FiO2 (%):  [100 %] 100 % Set Rate:  [18 bmp] 18 bmp Vt Set:  [550 mL] 550 mL PEEP:  [5 cmH20] 5 cmH20   Intake/Output Summary (Last 24 hours) at 04/26/2022 1523 Last data filed at 05/09/2022 1437 Gross per 24 hour  Intake 1000 ml  Output 100 ml  Net 900 ml   Filed Weights   05/11/2022 1214  Weight: 65 kg    Examination: General: elderly male, intubated on life support, critically ill  HENT: ETT in place  Lungs: Bilateral vented breath sounds  Cardiovascular:  RRR, s1 s2  Abdomen: soft non tender, non distended  Extremities: no edema  Neuro: sedated on support, occasional myoclonic jerking  GU: deferred   Resolved Hospital Problem list     Assessment & Plan:   PEA Cardiac Arrest  Likely respiratory mediated  Current Smoker  Likely component of ongoing AECOPD prior to admission  COPD, baseline  Concern for anoxic brain injury, acute metabolic encephalopathy related to above  P: Support post arrest care  Normothermia protocol  IPAL discussion already completed today, GOC,  DNR  Sedation with PAD guidelines, prop and fent ggt  LTVV, vent protocol  Unasyn post arrest for aspiration, >30 mins down  Neuroprotective measures and time to see if he has any recovery    Best Practice (right click and "Reselect all SmartList Selections" daily)   Diet/type: NPO DVT prophylaxis: prophylactic heparin  GI prophylaxis: PPI Lines: N/A Foley:  N/A Code Status:  DNR Last date of multidisciplinary goals of care discussion [I meet with family in the ED 7/8]  Labs   CBC: Recent Labs  Lab 05/21/2022 1217 05/16/2022 1317  WBC 3.9*  --   NEUTROABS 1.8  --   HGB 11.0* 10.2*  HCT 35.3* 30.0*  MCV 99.4  --   PLT 148*   --     Basic Metabolic Panel: Recent Labs  Lab 04/29/2022 1217 05/15/2022 1317  NA 139 133*  K 4.7 4.3  CL 99  --   CO2 18*  --   GLUCOSE 165*  --   BUN 8  --   CREATININE 1.05  --   CALCIUM 9.1  --    GFR: Estimated Creatinine Clearance: 59.3 mL/min (by C-G formula based on SCr of 1.05 mg/dL). Recent Labs  Lab 05/06/2022 1217  WBC 3.9*    Liver Function Tests: Recent Labs  Lab 04/29/2022 1217  AST 185*  ALT 73*  ALKPHOS 105  BILITOT 0.5  PROT 6.7  ALBUMIN 3.4*   Recent Labs  Lab 05/21/2022 1217  LIPASE 71*   No results for input(s): "AMMONIA" in the last 168 hours.  ABG    Component Value Date/Time   PHART 7.417 04/24/2022 1317   PCO2ART 35.4 05/13/2022 1317   PO2ART 99 05/19/2022 1317   HCO3 23.3 05/22/2022 1317   TCO2 24 05/13/2022 1317   ACIDBASEDEF 2.0 05/18/2022 1317   O2SAT 98 05/19/2022 1317     Coagulation Profile: No results for input(s): "INR", "PROTIME" in the last 168 hours.  Cardiac Enzymes: No results for input(s): "CKTOTAL", "CKMB", "CKMBINDEX", "TROPONINI" in the last 168 hours.  HbA1C: Hgb A1c MFr Bld  Date/Time Value Ref Range Status  09/07/2020 04:42 AM 5.5 4.8 - 5.6 % Final    Comment:    (NOTE) Pre diabetes:          5.7%-6.4%  Diabetes:              >6.4%  Glycemic control for   <7.0% adults with diabetes   05/02/2020 12:28 PM 5.5 4.8 - 5.6 % Final    Comment:    (NOTE) Pre diabetes:          5.7%-6.4%  Diabetes:              >6.4%  Glycemic control for   <7.0% adults with diabetes     CBG: No results for input(s): "GLUCAP" in the last 168 hours.  Review of Systems:   Unable to obtain. Critically ill.   Past Medical History:  He,  has a past medical history of Acute blood loss anemia (06/21/2018), Acute upper GI bleed (06/21/2018), Allergy, Bilateral AVN of femurs ([place]) (06/25/2018), COPD (12/21/2006), Environmental allergies, History of femur fracture (06/25/2018), History of Fracture of right tibia (06/26/2018),  History of multiple right thoacic rib fractures (06/25/2018), History of Right clavicular fracture (06/25/2018), HYPERLIPIDEMIA (12/21/2006), HYPERTENSION, BENIGN SYSTEMIC (12/21/2006), Mallory-Weiss tear, and TOBACCO DEPENDENCE (12/21/2006).   Surgical History:   Past Surgical History:  Procedure Laterality Date   ANKLE SURGERY     left ankle  APPLICATION OF WOUND VAC  01/06/2012   Procedure: APPLICATION OF WOUND VAC;  Surgeon: Meredith Pel, MD;  Location: [facility];  Service: Orthopedics;  Laterality: Left;   APPLICATION OF WOUND VAC Right 10/17/2015   Procedure: APPLICATION OF WOUND VAC;  Surgeon: Leandrew Koyanagi, MD;  Location: [place];  Service: Orthopedics;  Laterality: Right;   BIOPSY  06/22/2018   Procedure: BIOPSY;  Surgeon: Dorothea Ogle v, DO;  Location: [place] [facility];  Service: Gastroenterology;;   BRONCHIAL BIOPSY  05/04/2020   Procedure: BRONCHIAL BIOPSIES;  Surgeon: Garner Nash, DO;  Location: [facility];  Service: Pulmonary;;   BRONCHIAL BRUSHINGS  05/04/2020   Procedure: BRONCHIAL BRUSHINGS;  Surgeon: Garner Nash, DO;  Location: [facility] [facility];  Service: Pulmonary;;   BRONCHIAL NEEDLE ASPIRATION BIOPSY  05/04/2020   Procedure: BRONCHIAL NEEDLE ASPIRATION BIOPSIES;  Surgeon: Garner Nash, DO;  Location: [facility];  Service: Pulmonary;;   BRONCHIAL WASHINGS  05/04/2020   Procedure: BRONCHIAL WASHINGS;  Surgeon: Garner Nash, DO;  Location: [place];  Service: Pulmonary;;   CORONARY ARTERY BYPASS GRAFT N/A 12/16/2019   Procedure: CORONARY ARTERY BYPASS GRAFTING (CABG) TIMES 4 USING LEFT INTERNAL MAMMARY ARTERY AND ENDOSCOPICALLY HARVESTED RIGHT SAPHENOUS VEIN;  Surgeon: Lajuana Matte, MD;  Location: [facility];  Service: Open Heart Surgery;  Laterality: N/A;   ESOPHAGOGASTRODUODENOSCOPY (EGD) WITH PROPOFOL N/A 06/22/2018   Procedure: ESOPHAGOGASTRODUODENOSCOPY (EGD) WITH PROPOFOL;  Surgeon: [id number] v, DO;  Location: [facility];  Service:  Gastroenterology;  Laterality: N/A;   EXTERNAL FIXATION LEG  01/06/2012   Procedure: EXTERNAL FIXATION LEG;  Surgeon: Meredith Pel, MD;  [facility];  Service: Orthopedics;  Laterality: Left;  Smith-nephew external fixator set   EXTERNAL FIXATION LEG Right 09/16/2015   Procedure: EXTERNAL FIXATION LEG/LARGE;  Surgeon: Mcarthur Rossetti, MD;  Location: [place] [facility];  Service: Orthopedics;  Laterality: Right;   EXTERNAL FIXATION REMOVAL Right 09/22/2015   Procedure: REMOVAL EXTERNAL FIXATION LEG;  Surgeon: Mcarthur Rossetti, MD;  Location: [place];  Service: Orthopedics;  Laterality: Right;   FASCIOTOMY  01/10/2012   Procedure: FASCIOTOMY;  Surgeon: Rozanna Box, MD;  Location: [place] [facility];  Service: Orthopedics;  Laterality: Left;  status post fasciotomy with wound vac left calf; adjustment external fixator left tibia under fluoro; retention suture/wound vac placement left lateral calf wound   FASCIOTOMY  01/06/2012   Procedure: FASCIOTOMY;  Surgeon: Meredith Pel, MD;  [facility];  Service: Orthopedics;  Laterality: Left;  Four compartment fasciotomy   FIDUCIAL MARKER PLACEMENT  05/04/2020   Procedure: FIDUCIAL MARKER PLACEMENT;  Surgeon: Garner Nash, DO;  Location: [facility];  Service: Pulmonary;;   HOT HEMOSTASIS N/A 06/22/2018   Procedure: HEMOSTASIS;  Surgeon: Dorothea Ogle v, DO;  Location: [place];  Service: Gastroenterology;  Laterality: N/A;  Epinepherine and clip placed   I & D EXTREMITY  01/12/2012   Procedure: IRRIGATION AND DEBRIDEMENT EXTREMITY;  Surgeon: Rozanna Box, MD;  Location: [place];  Service: Orthopedics;  Laterality: Left;  LAYERD CLOSURE LEFT LEG WOUND   I & D EXTREMITY Right 10/17/2015   Procedure: IRRIGATION AND DEBRIDEMENT EXTREMITY;  Surgeon: Leandrew Koyanagi, MD;  Location: [place];  Service: Orthopedics;  Laterality: Right;   I & D EXTREMITY Right 10/20/2015   Procedure: REPEAT IRRIGATION AND DEBRIDEMENT EXTREMITY, right lower leg;  Surgeon:  Mcarthur Rossetti, MD;  Location: [facility];  Service: Orthopedics;  Laterality: Right;   KNEE SURGERY     left knee and right knee  LEFT HEART CATH AND CORONARY ANGIOGRAPHY N/A 06/27/2018   Procedure: LEFT HEART CATH AND CORONARY ANGIOGRAPHY;  Surgeon: Martinique, Peter M, MD;  Location: [facility];  Service: Cardiovascular;  Laterality: N/A;   LEFT HEART CATH AND CORONARY ANGIOGRAPHY N/A 12/12/2019   Procedure: LEFT HEART CATH AND CORONARY ANGIOGRAPHY;  Surgeon: Wellington Hampshire, MD;  Location: [place] [facility];  Service: Cardiovascular;  Laterality: N/A;   MASS EXCISION Left 09/22/2015   Procedure: EXCISION MASS;  Surgeon: Mcarthur Rossetti, MD;  Location: [facility];  Service: Orthopedics;  Laterality: Left;   ORIF TIBIA PLATEAU  02/07/2012   Procedure: OPEN REDUCTION INTERNAL FIXATION (ORIF) TIBIAL PLATEAU;  Surgeon: Rozanna Box, MD;  Location: [place];  Service: Orthopedics;  Laterality: Left;  ORIF LEFT TIBIAL PLATEAU/ REMOVE EXTERNAL FIXATION LEFT LEG   ORIF TIBIA PLATEAU Right 09/22/2015   Procedure: REMOVAL OF EXTERNAL FIXATOR RIGHT LEG, OPEN REDUCTION INTERNAL FIXATION (ORIF) RIGHT TIBIAL PLATEAU, EXCISION SMALL MASS LEFT LEG;  Surgeon: Mcarthur Rossetti, MD;  Location: [place];  Service: Orthopedics;  Laterality: Right;   SKIN SPLIT GRAFT Right 10/23/2015   Procedure: RIGHT GASTROC FLAP SPLIT THICKNESS SKIN GRAFT FROM RIGHT THING TO RIGHT LEG;  Surgeon: Irene Limbo, MD;  Location: [facility];  Service: Plastics;  Laterality: Right;   TEE WITHOUT CARDIOVERSION N/A 12/16/2019   Procedure: TRANSESOPHAGEAL ECHOCARDIOGRAM (TEE);  Surgeon: Lajuana Matte, MD;  Location: [place];  Service: Open Heart Surgery;  Laterality: N/A;   VIDEO BRONCHOSCOPY WITH ENDOBRONCHIAL NAVIGATION Right 05/04/2020   Procedure: VIDEO BRONCHOSCOPY WITH ENDOBRONCHIAL NAVIGATION;  Surgeon: Garner Nash, DO;  Location: [facility];  Service: Pulmonary;  Laterality: Right;   WRIST SURGERY     left      Social History:   reports that he has been smoking cigarettes. He has a 25.50 pack-year smoking history. He quit smokeless tobacco use about 57 years ago.  His smokeless tobacco use included snuff and chew. He reports current alcohol use of about 6.0 standard drinks of alcohol per week. He reports that he does not use drugs.   Family History:  His family history includes Emphysema in his father.   Allergies Allergies  Allergen Reactions   Ace Inhibitors Swelling   Ampicillin Nausea And Vomiting   Penicillins Nausea And Vomiting    Did it involve swelling of the face/tongue/throat, SOB, or low BP? N Did it involve sudden or severe rash/hives, skin peeling, or any reaction on the inside of your mouth or nose? N Did you need to seek medical attention at a hospital or doctor's office? N When did it last happen?   Teenager    If all above answers are "NO", may proceed with cephalosporin use.   Codeine Nausea And Vomiting   Lisinopril Swelling    REACTION: angioedema   Aspirin     Nose bleeds     Home Medications  Prior to Admission medications   Medication Sig Start Date End Date Taking? Authorizing Provider  albuterol (VENTOLIN HFA) 108 (90 Base) MCG/ACT inhaler INHALE 2 PUFFS BY MOUTH EVERY 4 HOURS FOR WHEEZING OR SHORTNESS OF BREATH 03/07/22   Brimage, Vondra, DO  diclofenac Sodium (VOLTAREN) 1 % GEL Apply 2 g topically 4 (four) times daily. Patient taking differently: Apply 2 g topically 4 (four) times daily as needed (pain). 12/28/20   [name], DO  Fluticasone-Umeclidin-Vilant (TRELEGY ELLIPTA) 100-62.5-25 MCG/ACT AEPB Inhale 1 puff into the lungs daily. 01/05/22   [name], DO  hydrocortisone cream 1 % Apply topically 2 (two) times daily. 01/10/22   Shary Key, DO  hydrOXYzine (ATARAX) 10 MG tablet TAKE 1 TABLET(10 MG) BY MOUTH EVERY 8 HOURS AS NEEDED FOR ANXIETY 01/25/22   Brimage, Vondra, DO  ipratropium-albuterol (DUONEB) 0.5-2.5 (3) MG/3ML SOLN Take 3 mLs by  nebulization every 6 (six) hours as needed. Patient taking differently: Take 3 mLs by nebulization every 6 (six) hours as needed (for wheezing and shortness of breath). 09/10/20   Ghimire, Henreitta Leber, MD  metoprolol tartrate (LOPRESSOR) 25 MG tablet TAKE 1 TABLET(25 MG) BY MOUTH TWICE DAILY Patient taking differently: Take 25 mg by mouth 2 (two) times daily. 08/24/21   Brimage, Ronnette Juniper, DO  nitroGLYCERIN (NITROSTAT) 0.4 MG SL tablet Place 1 tablet (0.4 mg total) under the tongue every 5 (five) minutes as needed for chest pain. 12/10/21   Brimage, Ronnette Juniper, DO  predniSONE (DELTASONE) 10 MG tablet Take 4 tablets daily X 2 days, then, Take 3 tablets daily X 2 days, then, Take 2 tablets daily X 2 days, then, Take 1 tablets daily X 1 day. 01/20/22   Terrilee Croak, MD  rosuvastatin (CRESTOR) 40 MG tablet Take 40 mg by mouth daily. 11/02/21   [provider]  loratadine (CLARITIN) 10 MG tablet Take 1 tablet (10 mg total) by mouth daily. 03/04/11 05/23/18  Mariana Arn, MD     This patient is critically ill with multiple organ system failure; which, requires frequent high complexity decision making, assessment, support, evaluation, and titration of therapies. This was completed through the application of advanced monitoring technologies and extensive interpretation of multiple databases. During this encounter critical care time was devoted to patient care services described in this note for 36 minutes.  Garner Nash, DO [facility] 04/24/2022 4:01 PM

## 2022-04-30 NOTE — ED Triage Notes (Signed)
BIB [facility] after pt family called to report pt unresponsive. Per EMS, approx 35 min of CPR was performed before achieving ROSC. Pt was given 5 epi's and then started on an EPI drip. Drip d/c upon hospital arrival. PT intubated prior to arrival. MD Pearline Cables and RT at bedside.   HX COPD

## 2022-04-30 NOTE — ED Notes (Signed)
Pt soiled again. Pt cleaned. Bear Hugger applied.

## 2022-04-30 NOTE — Progress Notes (Signed)
Patient transported to CT and back without complications. RN at bedside.  

## 2022-04-30 NOTE — Progress Notes (Signed)
   05/14/2022 1315  Clinical Encounter Type  Visited With Family;Health care provider  Visit Type Initial;ED;Critical Care    Chaplain responded to a patient who is post-CPR. Large number of family is in consultation A. Chaplain extended hospitality.Chaplain introduced spiritual care services. Spiritual care services available as needed.   Jeri Lager, Chaplain

## 2022-04-30 NOTE — Progress Notes (Signed)
Called to dp EEG placement and RN is unavil. Per [place], pt just got to the floor and she can not take a call

## 2022-04-30 NOTE — ED Notes (Signed)
MD Pearline Cables made aware of pt bradying to 74's

## 2022-04-30 NOTE — Progress Notes (Signed)
Pharmacy Antibiotic Note  Lucas [name] is a 71 y.o. male admitted on 05/16/2022 presenting post-arrest, concern for aspiration.  Pharmacy has been consulted for Unasyn dosing.  Plan: Unasyn 3g IV every 8 hours x 7d Monitor renal function  Height: 5\' 8"  (172.7 cm) Weight: 65 kg (143 lb 4.8 oz) IBW/kg (Calculated) : 68.4  Temp (24hrs), Avg:94.9 F (34.9 C), Min:93.6 F (34.2 C), Max:96 F (35.6 C)  Recent Labs  Lab 04/28/2022 1217  WBC 3.9*  CREATININE 1.05    Estimated Creatinine Clearance: 59.3 mL/min (by C-G formula based on SCr of 1.05 mg/dL).    Allergies  Allergen Reactions   Ace Inhibitors Swelling   Ampicillin Nausea And Vomiting   Penicillins Nausea And Vomiting    Did it involve swelling of the face/tongue/throat, SOB, or low BP? N Did it involve sudden or severe rash/hives, skin peeling, or any reaction on the inside of your mouth or nose? N Did you need to seek medical attention at a hospital or doctor's office? N When did it last happen?   Teenager    If all above answers are "NO", may proceed with cephalosporin use.   Codeine Nausea And Vomiting   Lisinopril Swelling    REACTION: angioedema   Aspirin     Nose bleeds    Bertis Ruddy, PharmD Clinical Pharmacist ED Pharmacist Phone # [phone] 05/22/2022 3:51 PM

## 2022-04-30 NOTE — Progress Notes (Addendum)
RT transported pt from ED trauma A to 3M08 on ventilator. Pt tolerated well with vitals stable throughout, no apparent complications. RN at bedside.

## 2022-04-30 NOTE — Progress Notes (Signed)
PER RN, pt is being moved to 65m 10

## 2022-04-30 NOTE — ED Provider Notes (Addendum)
[facility] EMERGENCY DEPARTMENT Provider Note   CSN: [id number] Arrival date & time: 05/01/2022  1148     History  Chief Complaint  Patient presents with   Post arrest    Lucas [name] is a 71 y.o. male.  Patient is a 71 year old male with past medical history of hypertension, COPD, and current smoker presenting to ED via EMS for cardiac arrest.  EMS states they were called out to the house for respiratory distress.  Patient had wheezing difficulty breathing as per family members.  Patient then had witnessed collapse by family members.  When the fire department arrived patient had pulses but was bradycardic in the 50s.  When EMS arrived patient was had reported bradycardia that quickly progressed to no pulses. ACLS was started.  Patient was reported to have 5 rounds of epinephrine with asystole on each rhythm check prior to acheiving ROSC.  EMS had poor breath sounds with LMA so they replaced it with an ET tube size 8. Pt received duonebs via ET tube x 1.  Minimal IV sedation prior to arrival.  On arrival to the emergency department, patient has a GCS of 3, pinpoint and nonreactive pupils, does not withdraw from pain, not awake from sternal rub, ET tube size 8 secured at 26 at the lip. Pt has bilateral equal breath sounds on vent. No wheezing.  Normal sinus rhythm on the monitor with a rate of 93 bpm.  No ST segment elevation or depression.  The history is provided by the EMS personnel. No language interpreter was used.       Home Medications Prior to Admission medications   Medication Sig Start Date End Date Taking? Authorizing Provider  albuterol (VENTOLIN HFA) 108 (90 Base) MCG/ACT inhaler INHALE 2 PUFFS BY MOUTH EVERY 4 HOURS FOR WHEEZING OR SHORTNESS OF BREATH 03/07/22   Brimage, Vondra, DO  diclofenac Sodium (VOLTAREN) 1 % GEL Apply 2 g topically 4 (four) times daily. Patient taking differently: Apply 2 g topically 4 (four) times daily as needed (pain). 12/28/20    [name], DO  Fluticasone-Umeclidin-Vilant (TRELEGY ELLIPTA) 100-62.5-25 MCG/ACT AEPB Inhale 1 puff into the lungs daily. 01/05/22   [name], DO  hydrocortisone cream 1 % Apply topically 2 (two) times daily. 01/10/22   Shary Key, DO  hydrOXYzine (ATARAX) 10 MG tablet TAKE 1 TABLET(10 MG) BY MOUTH EVERY 8 HOURS AS NEEDED FOR ANXIETY 01/25/22   Brimage, Vondra, DO  ipratropium-albuterol (DUONEB) 0.5-2.5 (3) MG/3ML SOLN Take 3 mLs by nebulization every 6 (six) hours as needed. Patient taking differently: Take 3 mLs by nebulization every 6 (six) hours as needed (for wheezing and shortness of breath). 09/10/20   Ghimire, Henreitta Leber, MD  metoprolol tartrate (LOPRESSOR) 25 MG tablet TAKE 1 TABLET(25 MG) BY MOUTH TWICE DAILY Patient taking differently: Take 25 mg by mouth 2 (two) times daily. 08/24/21   Brimage, Ronnette Juniper, DO  nitroGLYCERIN (NITROSTAT) 0.4 MG SL tablet Place 1 tablet (0.4 mg total) under the tongue every 5 (five) minutes as needed for chest pain. 12/10/21   Brimage, Ronnette Juniper, DO  predniSONE (DELTASONE) 10 MG tablet Take 4 tablets daily X 2 days, then, Take 3 tablets daily X 2 days, then, Take 2 tablets daily X 2 days, then, Take 1 tablets daily X 1 day. 01/20/22   Terrilee Croak, MD  rosuvastatin (CRESTOR) 40 MG tablet Take 40 mg by mouth daily. 11/02/21   [provider]  loratadine (CLARITIN) 10 MG tablet Take 1 tablet (10  mg total) by mouth daily. 03/04/11 05/23/18  Mariana Arn, MD      Allergies    Ace inhibitors, Ampicillin, Penicillins, Codeine, Lisinopril, and Aspirin    Review of Systems   Review of Systems  Unable to perform ROS: Patient unresponsive    Physical Exam Updated Vital Signs BP 120/73   Pulse 95   Temp (!) 96 F (35.6 C)   Resp 18   Ht 5\' 8"  (1.727 m)   Wt 65 kg   SpO2 95%   BMI 21.79 kg/m  Physical Exam  ED Results / Procedures / Treatments   Labs (all labs ordered are listed, but only abnormal results are displayed) Labs Reviewed   CBC WITH DIFFERENTIAL/PLATELET - Abnormal; Notable for the following components:      Result Value   WBC 3.9 (*)    RBC 3.55 (*)    Hemoglobin 11.0 (*)    HCT 35.3 (*)    Platelets 148 (*)    All other components within normal limits  COMPREHENSIVE METABOLIC PANEL - Abnormal; Notable for the following components:   CO2 18 (*)    Glucose, Bld 165 (*)    Albumin 3.4 (*)    AST 185 (*)    ALT 73 (*)    Anion gap 22 (*)    All other components within normal limits  LIPASE, BLOOD - Abnormal; Notable for the following components:   Lipase 71 (*)    All other components within normal limits  RAPID URINE DRUG SCREEN, HOSP PERFORMED - Abnormal; Notable for the following components:   Benzodiazepines POSITIVE (*)    All other components within normal limits  ACETAMINOPHEN LEVEL - Abnormal; Notable for the following components:   Acetaminophen (Tylenol), Serum <10 (*)    All other components within normal limits  SALICYLATE LEVEL - Abnormal; Notable for the following components:   Salicylate Lvl <8.6 (*)    All other components within normal limits  D-DIMER, QUANTITATIVE - Abnormal; Notable for the following components:   D-Dimer, Quant 11.10 (*)    All other components within normal limits  I-STAT ARTERIAL BLOOD GAS, ED - Abnormal; Notable for the following components:   Sodium 133 (*)    HCT 30.0 (*)    Hemoglobin 10.2 (*)    All other components within normal limits  TROPONIN I (HIGH SENSITIVITY) - Abnormal; Notable for the following components:   Troponin I (High Sensitivity) 114 (*)    All other components within normal limits  ETHANOL  TROPONIN I (HIGH SENSITIVITY)    EKG None  Radiology CT Angio Chest/Abd/Pel for Dissection W and/or Wo Contrast  Result Date: 05/19/2022 CLINICAL DATA:  Found unresponsive, status post CPR with ROSC, lung cancer * Tracking Code: BO * EXAM: CT ANGIOGRAPHY CHEST, ABDOMEN AND PELVIS TECHNIQUE: Non-contrast CT of the chest was initially  obtained. Multidetector CT imaging through the chest, abdomen and pelvis was performed using the standard protocol during bolus administration of intravenous contrast. Multiplanar reconstructed images and MIPs were obtained and reviewed to evaluate the vascular anatomy. RADIATION DOSE REDUCTION: This exam was performed according to the departmental dose-optimization program which includes automated exposure control, adjustment of the mA and/or kV according to patient size and/or use of iterative reconstruction technique. CONTRAST:  139mL OMNIPAQUE IOHEXOL 350 MG/ML SOLN COMPARISON:  CT chest angiogram, 01/03/2022 CT chest, 08/25/2021, PET-CT, 06/05/2020 FINDINGS: CTA CHEST FINDINGS VASCULAR Aorta: Satisfactory opacification of the aorta. Normal contour and caliber of the thoracic aorta. No evidence of  aneurysm, dissection, or other acute aortic pathology. Scattered aortic atherosclerosis. Cardiovascular: No evidence of pulmonary embolism on limited non-tailored examination. Mild cardiomegaly. Extensive three-vessel coronary artery calcifications and/or stents. Enlargement of the main pulmonary artery, measuring up to 3.7 cm in caliber. No pericardial effusion. Review of the MIP images confirms the above findings. NON VASCULAR Mediastinum/Nodes: No enlarged mediastinal, hilar, or axillary lymph nodes. Diffusely fluid-filled esophagus. Thyroid gland demonstrates no significant findings. Lungs/Pleura: Endotracheal tube positioned with tip in the right mainstem bronchus (series 7, image 64). Moderate to severe centrilobular emphysema and diffuse bilateral bronchial wall thickening. Unchanged post treatment appearance of the peripheral right upper lobe with a dense spiculated treated pulmonary nodule this adjacent fiducial markers measuring proximally 3.4 x 3.1 cm (series 8, image 47). No pleural effusion or pneumothorax. Musculoskeletal: No chest wall abnormality. Multiple nondisplaced fractures of the anterior ribs  bilaterally. Review of the MIP images confirms the above findings. CTA ABDOMEN AND PELVIS FINDINGS VASCULAR Normal contour and caliber of the abdominal aorta. No evidence of aneurysm, dissection, or other acute aortic pathology. Direct origin of the celiac branch vessels from the aorta with otherwise standard branching pattern of the abdominal aorta and solitary bilateral renal arteries. Moderate mixed calcific atherosclerosis. Review of the MIP images confirms the above findings. NON-VASCULAR Hepatobiliary: No solid liver abnormality is seen. No gallstones, gallbladder wall thickening, or biliary dilatation. Pancreas: Unremarkable. No pancreatic ductal dilatation or surrounding inflammatory changes. Spleen: Normal in size without significant abnormality. Adrenals/Urinary Tract: Adrenal glands are unremarkable. Kidneys are normal, without renal calculi, solid lesion, or hydronephrosis. Thickening of the urinary bladder wall, likely secondary to chronic outlet obstruction. Foley catheter within the urinary bladder. Stomach/Bowel: Stomach is within normal limits. Appendix appears normal. No evidence of bowel wall thickening, distention, or inflammatory changes. Diffusely fluid-filled colon to the rectum. Lymphatic: No enlarged abdominal or pelvic lymph nodes. Reproductive: Prostatomegaly. Other: No abdominal wall hernia or abnormality. No ascites. Musculoskeletal: No acute osseous findings. IMPRESSION: 1. Normal contour and caliber of the thoracic and abdominal aorta, without evidence of aneurysm, dissection, or other acute aortic pathology. Moderate mixed calcific atherosclerosis. 2. Endotracheal tube positioned with tip in the right mainstem bronchus. Recommend retraction. 3. Unchanged post treatment appearance of the peripheral right upper lobe with a dense spiculated treated pulmonary nodule. 4. Multiple bilateral nondisplaced fractures of the anterior ribs, in keeping CPR. 5. Diffusely fluid-filled esophagus,  potentially placing the patient at risk for aspiration. 6. Moderate to severe emphysema and diffuse bilateral bronchial wall thickening. 7. Enlargement of the main pulmonary artery, as can be seen in pulmonary arterial hypertension. 8. Diffusely fluid-filled colon, in keeping with diarrheal illness. These results were called by telephone at the time of interpretation on 05/10/2022 at 5:18 pm to Dr. Campbell Stall , who verbally acknowledged these results. Aortic Atherosclerosis (ICD10-I70.0) and Emphysema (ICD10-J43.9). Electronically Signed   By: Delanna Ahmadi M.D.   On: 05/18/2022 14:53   CT Head Wo Contrast  Result Date: 05/14/2022 CLINICAL DATA:  Unresponsive. EXAM: CT HEAD WITHOUT CONTRAST TECHNIQUE: Contiguous axial images were obtained from the base of the skull through the vertex without intravenous contrast. RADIATION DOSE REDUCTION: This exam was performed according to the departmental dose-optimization program which includes automated exposure control, adjustment of the mA and/or kV according to patient size and/or use of iterative reconstruction technique. COMPARISON:  01/15/2022. FINDINGS: Brain: No evidence of acute infarction, hemorrhage, hydrocephalus, extra-axial collection or mass lesion/mass effect. Remote right basal ganglia lacunar infarct. There is mild diffuse low-attenuation within  the subcortical and periventricular white matter compatible with chronic microvascular disease. Chronic bilateral occipital lobe encephalomalacia is again noted which communicates with the occipital horns of both ventricles. Vascular: No hyperdense vessel or unexpected calcification. Skull: Normal. Negative for fracture or focal lesion. Sinuses/Orbits: No acute finding. Other: None. IMPRESSION: 1. No acute intracranial abnormalities. 2. Chronic small vessel ischemic disease 3. Chronic bilateral occipital lobe encephalomalacia, which may reflect a congenital anomaly. Electronically Signed   By: Kerby Moors M.D.    On: 05/04/2022 14:39   DG Chest Portable 1 View  Result Date: 04/23/2022 CLINICAL DATA:  confirm Et tube EXAM: PORTABLE CHEST 1 VIEW COMPARISON:  January 17, 2022. FINDINGS: Right mainstem ET tube intubation. Redemonstrated right upper lung mass. Similar clips in this region. Emphysematous change. No confluent consolidation. No visible pleural effusions or pneumothorax. Similar cardiomediastinal silhouette. CABG and median sternotomy. Remote right rib fractures. IMPRESSION: 1. Right mainstem ET tube intubation.  Recommend retraction. 2. Chronic findings, detailed above. Findings discussed with Dr. Pearline Cables via telephone at 2:10 p.m. Electronically Signed   By: Margaretha Sheffield M.D.   On: 04/28/2022 14:12    Procedures .Critical Care  Performed by: Lianne Cure, DO Authorized by: Lianne Cure, DO   Critical care provider statement:    Critical care time (minutes):  86   Critical care was necessary to treat or prevent imminent or life-threatening deterioration of the following conditions:  Cardiac failure and respiratory failure   Critical care was time spent personally by me on the following activities:  Development of treatment plan with patient or surrogate, discussions with consultants, evaluation of patient's response to treatment, examination of patient, ordering and review of laboratory studies, ordering and review of radiographic studies, ordering and performing treatments and interventions, pulse oximetry, re-evaluation of patient's condition and review of old charts   Care discussed with: admitting provider     Care discussed with comment:  Dr. Valeta Harms     Medications Ordered in ED Medications  midazolam (VERSED) 100 mg/100 mL (1 mg/mL) premix infusion (3 mg/hr Intravenous Rate/Dose Change 05/07/2022 1421)  propofol (DIPRIVAN) 1000 MG/100ML infusion (12 mcg/kg/min  65 kg Intravenous Rate/Dose Change 05/22/2022 1417)  EPINEPHrine (ADRENALIN) 5 mg in NS 250 mL (0.02 mg/mL) premix infusion (1  mcg/min Intravenous Rate/Dose Verify 05/15/2022 1437)  fentaNYL 2538mcg in NS 279mL (47mcg/ml) infusion-PREMIX (has no administration in time range)  ipratropium-albuterol (DUONEB) 0.5-2.5 (3) MG/3ML nebulizer solution 3 mL (3 mLs Nebulization Given by Other 04/26/2022 1308)  methylPREDNISolone sodium succinate (SOLU-MEDROL) 125 mg/2 mL injection 125 mg (125 mg Intravenous Given 04/24/2022 1306)  sodium chloride 0.9 % bolus 1,000 mL (0 mLs Intravenous Stopped 04/28/2022 1437)  atropine 1 MG/10ML injection 1 mg (1 mg Intravenous Given 05/05/2022 1330)  fentaNYL (SUBLIMAZE) injection 100 mcg (100 mcg Intravenous Given 05/10/2022 1448)  fentaNYL (SUBLIMAZE) 100 MCG/2ML injection (100 mcg  Given 04/29/2022 1333)  iohexol (OMNIPAQUE) 350 MG/ML injection 100 mL (100 mLs Intravenous Contrast Given 05/17/2022 1419)    ED Course/ Medical Decision Making/ A&P                           Medical Decision Making Amount and/or Complexity of Data Reviewed Labs: ordered. Radiology: ordered.  Risk Prescription drug management. Decision regarding hospitalization.   3:20 PM Patient is a 71 year old male with past medical history of hypertension, COPD, and current smoker presenting to ED via EMS for cardiac arrest.  EMS states they were called out  to the house for respiratory distress.  Patient had wheezing difficulty breathing as per family members.  Patient then had witnessed collapse by family members.  When the fire department arrived patient had pulses but was bradycardic in the 50s.  When EMS arrived patient was had reported bradycardia that quickly progressed to no pulses. ACLS was started.  Patient was reported to have 5 rounds of epinephrine with asystole on each rhythm check prior to acheiving ROSC.  EMS had poor breath sounds with LMA so they replaced it with an ET tube size 8. Pt received duonebs via ET tube x 1.  Minimal IV sedation prior to arrival.  On arrival to the emergency department, patient has a GCS of 3, pinpoint and  nonreactive pupils, does not withdraw from pain, not awake from sternal rub, ET tube size 8 secured at 26 at the lip. Pt has bilateral equal breath sounds on vent. No wheezing.  Normal sinus rhythm on the monitor with a rate of 93 bpm.  No ST segment elevation or depression.  Troponin is elevated at 114 likely due to ischemic demand pulmonary arrest followed by cardiac arrest and CPR.  Continues to have double rounds of defecation.  In the setting of witnessed collapse, GCS of 3 post ROSC with minimal sedation, repeat defecation, and elevated D-dimer of 11 I think is fair at this time to order CT scan to rule out any dissection or pulmonary embolism resulting in pulmonary-cardiac arrest.  Although findings are possibly secondary to anoxic brain injury.  All to bedside, patient becoming bradycardic again.  Ulcers remain intact.  Atropine given and epinephrine drip restarted.  Heart rate and blood pressure stable at this time.  Patient stable for CT.  3:20 PM Call from radiology.  Patient G-tube is right mainstem.  Will retract ET tube 3-4 cm and repeat CXR.    Family up to date on patient status. Family is spiritual and would like Chaplin. Chaplin consulted.   CT dissection demonstrates no dissection. Few rib fractures likely secondary to compressions.   Patient admitted to intensivist team with Dr. Valeta Harms.     Final Clinical Impression(s) / ED Diagnoses Final diagnoses:  Respiratory arrest [facility])  Cardiac arrest ([place])  Closed fracture of multiple ribs, unspecified laterality, initial encounter  Bradycardia    Rx / DC Orders ED Discharge Orders     None         Lianne Cure, DO 95/18/84 1660    Campbell Stall P, DO 63/01/60 1520

## 2022-04-30 NOTE — Progress Notes (Signed)
Ceribell placed on pt per MD request.

## 2022-05-01 DIAGNOSIS — R092 Respiratory arrest: Secondary | ICD-10-CM

## 2022-05-01 DIAGNOSIS — R569 Unspecified convulsions: Secondary | ICD-10-CM

## 2022-05-01 DIAGNOSIS — Z66 Do not resuscitate: Secondary | ICD-10-CM

## 2022-05-01 DIAGNOSIS — I469 Cardiac arrest, cause unspecified: Secondary | ICD-10-CM | POA: Diagnosis not present

## 2022-05-01 LAB — GLUCOSE, CAPILLARY
Glucose-Capillary: 128 mg/dL — ABNORMAL HIGH (ref 70–99)
Glucose-Capillary: 139 mg/dL — ABNORMAL HIGH (ref 70–99)
Glucose-Capillary: 136 mg/dL — ABNORMAL HIGH (ref 70–99)
Glucose-Capillary: 121 mg/dL — ABNORMAL HIGH (ref 70–99)
Glucose-Capillary: 109 mg/dL — ABNORMAL HIGH (ref 70–99)

## 2022-05-01 LAB — BASIC METABOLIC PANEL
Anion gap: 13 (ref 5–15)
BUN: 13 mg/dL (ref 8–23)
CO2: 27 mmol/L (ref 22–32)
Calcium: 9 mg/dL (ref 8.9–10.3)
Chloride: 102 mmol/L (ref 98–111)
Creatinine, Ser: 0.91 mg/dL (ref 0.61–1.24)
GFR, Estimated: >60 mL/min (ref >60)
Glucose, Bld: 155 mg/dL — ABNORMAL HIGH (ref 70–99)
Potassium: 3.5 mmol/L (ref 3.5–5.1)
Sodium: 142 mmol/L (ref 135–145)

## 2022-05-01 LAB — TRIGLYCERIDES: Triglycerides: 52 mg/dL (ref <150)

## 2022-05-01 LAB — CBC
HCT: 34.2 % — ABNORMAL LOW (ref 39.0–52.0)
Hemoglobin: 11 g/dL — ABNORMAL LOW (ref 13.0–17.0)
MCH: 31.1 pg (ref 26.0–34.0)
MCHC: 32.2 g/dL (ref 30.0–36.0)
MCV: 96.6 fL (ref 80.0–100.0)
Platelets: 144 10*3/uL — ABNORMAL LOW (ref 150–400)
RBC: 3.54 MIL/uL — ABNORMAL LOW (ref 4.22–5.81)
RDW: 12.9 % (ref 11.5–15.5)
WBC: 10.5 10*3/uL (ref 4.0–10.5)
nRBC: 0 % (ref 0.0–0.2)

## 2022-05-01 MED ORDER — CHLORHEXIDINE GLUCONATE CLOTH 2 % EX PADS
6.0000 | MEDICATED_PAD | Freq: Every day | CUTANEOUS | Status: DC
  Administered 2022-04-30 – 2022-05-02 (×2): 6 via TOPICAL

## 2022-05-01 MED ORDER — ORAL CARE MOUTH RINSE
15.0000 mL | OROMUCOSAL | Status: DC | PRN
Start: 2022-05-01 — End: 2022-05-02

## 2022-05-01 MED ORDER — ORAL CARE MOUTH RINSE
15.0000 mL | OROMUCOSAL | Status: DC
  Administered 2022-05-01 – 2022-05-02 (×16): 15 mL via OROMUCOSAL

## 2022-05-01 NOTE — Procedures (Signed)
Patient Name: YONIEL ARKWRIGHT  MRN: [id number]  Epilepsy Attending: Lora Havens  Referring Physician/Provider: Dr June Leap Duration: 05/07/2022 1945 to 05/01/2022 1253  Patient history: 71yo M s/ cardiorespiraotry arrest. EEG to evaluate for seizure.  Level of alertness: comatose  AEDs during EEG study: LEV, propofol  Technical aspects: This EEG was obtained using a 10 lead EEG system positioned circumferentially without any parasagittal coverage (rapid EEG). Computer selected EEG is reviewed as  well as background features and all clinically significant events.  Description: EEG showed burst suppression pattern  with bursts of generalized bursts of highly epileptiform discharges lasting 0.5 to 15 seconds. In between bursts, eeg showed generalized suppression. Hyperventilation and photic stimulation were not performed.     ABNORMALITY -Burst suppression with highly epileptiform dicharges, generalized  IMPRESSION: This limited ceribell EEG showed evidence of epileptogenicity with generalized onset and high risk of seizure as well as profound diffuse encephalopathy. In the setting of cardiac arrest, this could be secondary to anoxic-hypoxic brain injury.    Please obtain conventional EEG for further characterization if clinically indicated.   Hanford Lust Barbra Sarks
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
## 2022-05-01 NOTE — Progress Notes (Signed)
NAME:  Lucas [name], MRN:  [id number], DOB:  [birth year], LOS: 1 ADMISSION DATE:  05/01/2022, CONSULTATION DATE:  05/13/2022 REFERRING MD: Dr Pearline Cables , CHIEF COMPLAINT: cardiac arrest    History of Present Illness:   71 yo M, PMH HTN, HLD, COPD, active tobacco use. Stage 1 squamous cell carcinoma RUL s/p SBRT (he is my clinic patient). Today the patient was having trouble breathing at home. Per the patients wife he has been having trouble with multiple spells per day. Increased shortness of breath. They finally made the decision to call EMS. Once EMS arrived he was found in respiratory distress, he became bradycardic, and ultimately suffered PEA cardiac arrest. CPR was started. He was placed on a lucas device. He received 30+ mins of chest compressions followed by ROSC.   I met with the patient family in the ED. He wife is obviously very distraught. She states that he would on want to be on life support or any of these things. She would like to at least make him a DNR and give him some time. She is prepared herself and her family that he may no survive this.   In the ED, the patients BP is stable off pressors. He is lightly sedated on propofol.   Pertinent  Medical History   Past Medical History:  Diagnosis Date   Acute blood loss anemia 06/21/2018   Acute upper GI bleed 06/21/2018   Allergy    Bilateral AVN of femurs ([place]) 06/25/2018   See 11/2017 Chest CT report   COPD 12/21/2006   Environmental allergies    History of femur fracture 06/25/2018   11/2017 CT AP showed Remote nonunited right femur greater trochanter fracture   History of Fracture of right tibia 06/26/2018   History of multiple right thoacic rib fractures 06/25/2018   History of Right clavicular fracture 06/25/2018   HYPERLIPIDEMIA 12/21/2006   HYPERTENSION, BENIGN SYSTEMIC 12/21/2006   Mallory-Weiss tear    TOBACCO DEPENDENCE 12/21/2006     Significant Hospital Events: Including procedures, antibiotic start and stop dates in addition to  other pertinent events     Interim History / Subjective:   Remains critically ill, intubated on life support.   Objective   Blood pressure 125/64, pulse 92, temperature 99 F (37.2 C), resp. rate (!) 22, height 5' 8"  (1.727 m), weight 69 kg, SpO2 99 %.    Vent Mode: PRVC FiO2 (%):  [40 %-100 %] 40 % Set Rate:  [18 bmp] 18 bmp Vt Set:  [550 mL] 550 mL PEEP:  [5 cmH20] 5 cmH20 Plateau Pressure:  [16 cmH20-24 cmH20] 24 cmH20   Intake/Output Summary (Last 24 hours) at 05/01/2022 0943 Last data filed at 05/01/2022 0347 Gross per 24 hour  Intake 1512.9 ml  Output 1050 ml  Net 462.9 ml   Filed Weights   05/22/2022 1214 04/29/2022 1730  Weight: 65 kg 69 kg    Examination: General: elderly male, intubated on life support. Cerribell in place HENT: ETT in place  Lungs: BL vented breaths  Cardiovascular:  RRR, s1 s2  Abdomen: soft, nt nd  Extremities: no edema  Neuro: off prop has continuous myoclonus, jerking  GU: deferred   Resolved Hospital Problem list     Assessment & Plan:   PEA Cardiac Arrest  Likely respiratory mediated  Current Smoker  Likely component of ongoing AECOPD prior to admission  COPD, baseline  Concern for anoxic brain injury, acute metabolic encephalopathy related to above Myoclonus, cerribel with generalized epileptogenicity  concerning for anoxic injury   P: Ongoing discussions with family  I do think they are realistic about his poor prognosis  LTVV, vent protocol  Mental status precludes liberation, sat and sbt  Recheck labs Cxr in am   Continue Unasyn, post arrest aspiration  Remains on cerribel If needed we can get and MRI but will only order if family needs more information or more time to aid in neuro prognostication post arrest   Best Practice (right click and "Reselect all SmartList Selections" daily)   Diet/type: NPO DVT prophylaxis: prophylactic heparin  GI prophylaxis: PPI Lines: N/A Foley:  N/A Code Status:  DNR Last date of  multidisciplinary goals of care discussion: I called his wife peggy but she did not answer this morning. We will try again later.   Labs   CBC: Recent Labs  Lab 05/17/2022 1217 04/23/2022 1317  WBC 3.9*  --   NEUTROABS 1.8  --   HGB 11.0* 10.2*  HCT 35.3* 30.0*  MCV 99.4  --   PLT 148*  --     Basic Metabolic Panel: Recent Labs  Lab 05/04/2022 1217 05/06/2022 1317  NA 139 133*  K 4.7 4.3  CL 99  --   CO2 18*  --   GLUCOSE 165*  --   BUN 8  --   CREATININE 1.05  --   CALCIUM 9.1  --    GFR: Estimated Creatinine Clearance: 62.4 mL/min (by C-G formula based on SCr of 1.05 mg/dL). Recent Labs  Lab 04/24/2022 1217  WBC 3.9*    Liver Function Tests: Recent Labs  Lab 05/14/2022 1217  AST 185*  ALT 73*  ALKPHOS 105  BILITOT 0.5  PROT 6.7  ALBUMIN 3.4*   Recent Labs  Lab 04/24/2022 1217  LIPASE 71*   No results for input(s): "AMMONIA" in the last 168 hours.  ABG    Component Value Date/Time   PHART 7.417 04/25/2022 1317   PCO2ART 35.4 04/25/2022 1317   PO2ART 99 04/23/2022 1317   HCO3 23.3 05/23/2022 1317   TCO2 24 04/24/2022 1317   ACIDBASEDEF 2.0 05/13/2022 1317   O2SAT 98 05/21/2022 1317     Coagulation Profile: No results for input(s): "INR", "PROTIME" in the last 168 hours.  Cardiac Enzymes: No results for input(s): "CKTOTAL", "CKMB", "CKMBINDEX", "TROPONINI" in the last 168 hours.  HbA1C: Hgb A1c MFr Bld  Date/Time Value Ref Range Status  09/07/2020 04:42 AM 5.5 4.8 - 5.6 % Final    Comment:    (NOTE) Pre diabetes:          5.7%-6.4%  Diabetes:              >6.4%  Glycemic control for   <7.0% adults with diabetes   05/02/2020 12:28 PM 5.5 4.8 - 5.6 % Final    Comment:    (NOTE) Pre diabetes:          5.7%-6.4%  Diabetes:              >6.4%  Glycemic control for   <7.0% adults with diabetes     CBG: Recent Labs  Lab 05/04/2022 1940 05/17/2022 2312 05/01/22 0755  GLUCAP 154* 146* 128*    Review of Systems:   Unable to obtain.  Critically ill.   Past Medical History:  He,  has a past medical history of Acute blood loss anemia (06/21/2018), Acute upper GI bleed (06/21/2018), Allergy, Bilateral AVN of femurs ([place]) (06/25/2018), COPD (12/21/2006), Environmental allergies, History of femur fracture (  06/25/2018), History of Fracture of right tibia (06/26/2018), History of multiple right thoacic rib fractures (06/25/2018), History of Right clavicular fracture (06/25/2018), HYPERLIPIDEMIA (12/21/2006), HYPERTENSION, BENIGN SYSTEMIC (12/21/2006), Mallory-Weiss tear, and TOBACCO DEPENDENCE (12/21/2006).   Surgical History:   Past Surgical History:  Procedure Laterality Date   ANKLE SURGERY     left ankle   APPLICATION OF WOUND VAC  01/06/2012   Procedure: APPLICATION OF WOUND VAC;  Surgeon: Meredith Pel, MD;  Location: [facility];  Service: Orthopedics;  Laterality: Left;   APPLICATION OF WOUND VAC Right 10/17/2015   Procedure: APPLICATION OF WOUND VAC;  Surgeon: Leandrew Koyanagi, MD;  Location: [facility];  Service: Orthopedics;  Laterality: Right;   BIOPSY  06/22/2018   Procedure: BIOPSY;  Surgeon: Dorothea Ogle v, DO;  Location: [facility];  Service: Gastroenterology;;   BRONCHIAL BIOPSY  05/04/2020   Procedure: BRONCHIAL BIOPSIES;  Surgeon: Garner Nash, DO;  Location: [place] [facility];  Service: Pulmonary;;   BRONCHIAL BRUSHINGS  05/04/2020   Procedure: BRONCHIAL BRUSHINGS;  Surgeon: Garner Nash, DO;  Location: [facility];  Service: Pulmonary;;   BRONCHIAL NEEDLE ASPIRATION BIOPSY  05/04/2020   Procedure: BRONCHIAL NEEDLE ASPIRATION BIOPSIES;  Surgeon: Garner Nash, DO;  Location: [facility];  Service: Pulmonary;;   BRONCHIAL WASHINGS  05/04/2020   Procedure: BRONCHIAL WASHINGS;  Surgeon: Garner Nash, DO;  Location: [facility];  Service: Pulmonary;;   CORONARY ARTERY BYPASS GRAFT N/A 12/16/2019   Procedure: CORONARY ARTERY BYPASS GRAFTING (CABG) TIMES 4 USING LEFT INTERNAL MAMMARY ARTERY AND ENDOSCOPICALLY HARVESTED RIGHT  SAPHENOUS VEIN;  Surgeon: Lajuana Matte, MD;  Location: [facility];  Service: Open Heart Surgery;  Laterality: N/A;   ESOPHAGOGASTRODUODENOSCOPY (EGD) WITH PROPOFOL N/A 06/22/2018   Procedure: ESOPHAGOGASTRODUODENOSCOPY (EGD) WITH PROPOFOL;  Surgeon: [id number] v, DO;  Location: [place] [facility];  Service: Gastroenterology;  Laterality: N/A;   EXTERNAL FIXATION LEG  01/06/2012   Procedure: EXTERNAL FIXATION LEG;  Surgeon: Meredith Pel, MD;  [facility];  Service: Orthopedics;  Laterality: Left;  Smith-nephew external fixator set   EXTERNAL FIXATION LEG Right 09/16/2015   Procedure: EXTERNAL FIXATION LEG/LARGE;  Surgeon: Mcarthur Rossetti, MD;  Location: [facility];  Service: Orthopedics;  Laterality: Right;   EXTERNAL FIXATION REMOVAL Right 09/22/2015   Procedure: REMOVAL EXTERNAL FIXATION LEG;  Surgeon: Mcarthur Rossetti, MD;  Location: [place];  Service: Orthopedics;  Laterality: Right;   FASCIOTOMY  01/10/2012   Procedure: FASCIOTOMY;  Surgeon: Rozanna Box, MD;  Location: [facility];  Service: Orthopedics;  Laterality: Left;  status post fasciotomy with wound vac left calf; adjustment external fixator left tibia under fluoro; retention suture/wound vac placement left lateral calf wound   FASCIOTOMY  01/06/2012   Procedure: FASCIOTOMY;  Surgeon: Meredith Pel, MD;  [facility];  Service: Orthopedics;  Laterality: Left;  Four compartment fasciotomy   FIDUCIAL MARKER PLACEMENT  05/04/2020   Procedure: FIDUCIAL MARKER PLACEMENT;  Surgeon: Garner Nash, DO;  Location: [facility];  Service: Pulmonary;;   HOT HEMOSTASIS N/A 06/22/2018   Procedure: HEMOSTASIS;  Surgeon: Dorothea Ogle v, DO;  Location: [place];  Service: Gastroenterology;  Laterality: N/A;  Epinepherine and clip placed   I & D EXTREMITY  01/12/2012   Procedure: IRRIGATION AND DEBRIDEMENT EXTREMITY;  Surgeon: Rozanna Box, MD;  Location: [place] [facility];  Service: Orthopedics;  Laterality: Left;  LAYERD CLOSURE  LEFT LEG WOUND   I & D EXTREMITY Right 10/17/2015   Procedure: IRRIGATION AND DEBRIDEMENT EXTREMITY;  Surgeon: Leandrew Koyanagi,  MD;  Location: [place] [facility];  Service: Orthopedics;  Laterality: Right;   I & D EXTREMITY Right 10/20/2015   Procedure: REPEAT IRRIGATION AND DEBRIDEMENT EXTREMITY, right lower leg;  Surgeon: Mcarthur Rossetti, MD;  Location: [facility];  Service: Orthopedics;  Laterality: Right;   KNEE SURGERY     left knee and right knee   LEFT HEART CATH AND CORONARY ANGIOGRAPHY N/A 06/27/2018   Procedure: LEFT HEART CATH AND CORONARY ANGIOGRAPHY;  Surgeon: Martinique, Peter M, MD;  Location: [facility];  Service: Cardiovascular;  Laterality: N/A;   LEFT HEART CATH AND CORONARY ANGIOGRAPHY N/A 12/12/2019   Procedure: LEFT HEART CATH AND CORONARY ANGIOGRAPHY;  Surgeon: Wellington Hampshire, MD;  Location: [place] [facility];  Service: Cardiovascular;  Laterality: N/A;   MASS EXCISION Left 09/22/2015   Procedure: EXCISION MASS;  Surgeon: Mcarthur Rossetti, MD;  Location: [facility];  Service: Orthopedics;  Laterality: Left;   ORIF TIBIA PLATEAU  02/07/2012   Procedure: OPEN REDUCTION INTERNAL FIXATION (ORIF) TIBIAL PLATEAU;  Surgeon: Rozanna Box, MD;  Location: [facility];  Service: Orthopedics;  Laterality: Left;  ORIF LEFT TIBIAL PLATEAU/ REMOVE EXTERNAL FIXATION LEFT LEG   ORIF TIBIA PLATEAU Right 09/22/2015   Procedure: REMOVAL OF EXTERNAL FIXATOR RIGHT LEG, OPEN REDUCTION INTERNAL FIXATION (ORIF) RIGHT TIBIAL PLATEAU, EXCISION SMALL MASS LEFT LEG;  Surgeon: Mcarthur Rossetti, MD;  Location: [facility];  Service: Orthopedics;  Laterality: Right;   SKIN SPLIT GRAFT Right 10/23/2015   Procedure: RIGHT GASTROC FLAP SPLIT THICKNESS SKIN GRAFT FROM RIGHT THING TO RIGHT LEG;  Surgeon: Irene Limbo, MD;  Location: [facility];  Service: Plastics;  Laterality: Right;   TEE WITHOUT CARDIOVERSION N/A 12/16/2019   Procedure: TRANSESOPHAGEAL ECHOCARDIOGRAM (TEE);  Surgeon: Lajuana Matte, MD;  Location:  [place];  Service: Open Heart Surgery;  Laterality: N/A;   VIDEO BRONCHOSCOPY WITH ENDOBRONCHIAL NAVIGATION Right 05/04/2020   Procedure: VIDEO BRONCHOSCOPY WITH ENDOBRONCHIAL NAVIGATION;  Surgeon: Garner Nash, DO;  Location: [facility];  Service: Pulmonary;  Laterality: Right;   WRIST SURGERY     left     Social History:   reports that he has been smoking cigarettes. He has a 25.50 pack-year smoking history. He quit smokeless tobacco use about 57 years ago.  His smokeless tobacco use included snuff and chew. He reports current alcohol use of about 6.0 standard drinks of alcohol per week. He reports that he does not use drugs.   Family History:  His family history includes Emphysema in his father.   Allergies Allergies  Allergen Reactions   Ace Inhibitors Swelling   Ampicillin Nausea And Vomiting   Penicillins Nausea And Vomiting    Did it involve swelling of the face/tongue/throat, SOB, or low BP? N Did it involve sudden or severe rash/hives, skin peeling, or any reaction on the inside of your mouth or nose? N Did you need to seek medical attention at a hospital or doctor's office? N When did it last happen?   Teenager    If all above answers are "NO", may proceed with cephalosporin use.   Codeine Nausea And Vomiting   Lisinopril Swelling    REACTION: angioedema   Aspirin     Nose bleeds     Home Medications  Prior to Admission medications   Medication Sig Start Date End Date Taking? Authorizing Provider  albuterol (VENTOLIN HFA) 108 (90 Base) MCG/ACT inhaler INHALE 2 PUFFS BY MOUTH EVERY 4 HOURS FOR WHEEZING OR SHORTNESS OF BREATH  03/07/22   [name], DO  diclofenac Sodium (VOLTAREN) 1 % GEL Apply 2 g topically 4 (four) times daily. Patient taking differently: Apply 2 g topically 4 (four) times daily as needed (pain). 12/28/20   [name], DO  Fluticasone-Umeclidin-Vilant (TRELEGY ELLIPTA) 100-62.5-25 MCG/ACT AEPB Inhale 1 puff into the lungs daily. 01/05/22    [name], DO  hydrocortisone cream 1 % Apply topically 2 (two) times daily. 01/10/22   Shary Key, DO  hydrOXYzine (ATARAX) 10 MG tablet TAKE 1 TABLET(10 MG) BY MOUTH EVERY 8 HOURS AS NEEDED FOR ANXIETY 01/25/22   Brimage, Vondra, DO  ipratropium-albuterol (DUONEB) 0.5-2.5 (3) MG/3ML SOLN Take 3 mLs by nebulization every 6 (six) hours as needed. Patient taking differently: Take 3 mLs by nebulization every 6 (six) hours as needed (for wheezing and shortness of breath). 09/10/20   Ghimire, Henreitta Leber, MD  metoprolol tartrate (LOPRESSOR) 25 MG tablet TAKE 1 TABLET(25 MG) BY MOUTH TWICE DAILY Patient taking differently: Take 25 mg by mouth 2 (two) times daily. 08/24/21   Brimage, Ronnette Juniper, DO  nitroGLYCERIN (NITROSTAT) 0.4 MG SL tablet Place 1 tablet (0.4 mg total) under the tongue every 5 (five) minutes as needed for chest pain. 12/10/21   Brimage, Ronnette Juniper, DO  predniSONE (DELTASONE) 10 MG tablet Take 4 tablets daily X 2 days, then, Take 3 tablets daily X 2 days, then, Take 2 tablets daily X 2 days, then, Take 1 tablets daily X 1 day. 01/20/22   Terrilee Croak, MD  rosuvastatin (CRESTOR) 40 MG tablet Take 40 mg by mouth daily. 11/02/21   [provider]  loratadine (CLARITIN) 10 MG tablet Take 1 tablet (10 mg total) by mouth daily. 03/04/11 05/23/18  Mariana Arn, MD    This patient is critically ill with multiple organ system failure; which, requires frequent high complexity decision making, assessment, support, evaluation, and titration of therapies. This was completed through the application of advanced monitoring technologies and extensive interpretation of multiple databases. During this encounter critical care time was devoted to patient care services described in this note for 35 minutes.  Garner Nash, DO [facility] 05/01/2022 9:43 AM

## 2022-05-02 DIAGNOSIS — I469 Cardiac arrest, cause unspecified: Secondary | ICD-10-CM | POA: Diagnosis not present

## 2022-05-02 LAB — CBC
HCT: 34.3 % — ABNORMAL LOW (ref 39.0–52.0)
Hemoglobin: 11.3 g/dL — ABNORMAL LOW (ref 13.0–17.0)
MCH: 31.6 pg (ref 26.0–34.0)
MCHC: 32.9 g/dL (ref 30.0–36.0)
MCV: 95.8 fL (ref 80.0–100.0)
Platelets: 139 10*3/uL — ABNORMAL LOW (ref 150–400)
RBC: 3.58 MIL/uL — ABNORMAL LOW (ref 4.22–5.81)
RDW: 13.2 % (ref 11.5–15.5)
WBC: 12.4 10*3/uL — ABNORMAL HIGH (ref 4.0–10.5)
nRBC: 0 % (ref 0.0–0.2)

## 2022-05-02 LAB — BASIC METABOLIC PANEL
Anion gap: 12 (ref 5–15)
BUN: 9 mg/dL (ref 8–23)
CO2: 26 mmol/L (ref 22–32)
Calcium: 8.9 mg/dL (ref 8.9–10.3)
Chloride: 104 mmol/L (ref 98–111)
Creatinine, Ser: 0.89 mg/dL (ref 0.61–1.24)
GFR, Estimated: >60 mL/min (ref >60)
Glucose, Bld: 115 mg/dL — ABNORMAL HIGH (ref 70–99)
Potassium: 3.9 mmol/L (ref 3.5–5.1)
Sodium: 142 mmol/L (ref 135–145)

## 2022-05-02 LAB — GLUCOSE, CAPILLARY
Glucose-Capillary: 79 mg/dL (ref 70–99)
Glucose-Capillary: 86 mg/dL (ref 70–99)

## 2022-05-02 MED ORDER — FENTANYL CITRATE (PF) 100 MCG/2ML IJ SOLN
INTRAMUSCULAR | Status: AC
  Administered 2022-05-02: 100 ug
  Filled 2022-05-02: qty 2

## 2022-05-02 MED ORDER — ORAL CARE MOUTH RINSE: 15.0000 mL | OROMUCOSAL | Status: DC | PRN

## 2022-05-02 MED ORDER — HYDROMORPHONE BOLUS VIA INFUSION: 1.0000 mg | INTRAVENOUS | Status: DC | PRN

## 2022-05-02 MED ORDER — GLYCOPYRROLATE 0.2 MG/ML IJ SOLN: 0.2000 mg | INTRAMUSCULAR | Status: DC | PRN

## 2022-05-02 MED ORDER — PROPOFOL 1000 MG/100ML IV EMUL
35.0000 ug/kg/min | INTRAVENOUS | Status: DC
  Administered 2022-05-02 (×2): 35 ug/kg/min via INTRAVENOUS
  Filled 2022-05-02 (×2): qty 100

## 2022-05-02 MED ORDER — SODIUM CHLORIDE 0.9 % IV SOLN
0.0000 mg/h | INTRAVENOUS | Status: DC
  Administered 2022-05-02: 1 mg/h via INTRAVENOUS
  Filled 2022-05-02: qty 5

## 2022-05-02 MED ORDER — ACETAMINOPHEN 160 MG/5ML PO SOLN
650.0000 mg | ORAL | Status: DC
  Administered 2022-05-02: 650 mg
  Filled 2022-05-02: qty 20.3

## 2022-05-02 MED ORDER — FENTANYL CITRATE (PF) 100 MCG/2ML IJ SOLN: 100.0000 ug | INTRAMUSCULAR | Status: DC | PRN

## 2022-05-02 MED ORDER — FENTANYL CITRATE (PF) 100 MCG/2ML IJ SOLN: 50.0000 ug | INTRAMUSCULAR | Status: DC | PRN

## 2022-05-02 MED ORDER — ORAL CARE MOUTH RINSE
15.0000 mL | OROMUCOSAL | Status: DC
  Administered 2022-05-02 (×2): 15 mL via OROMUCOSAL

## 2022-05-02 MED ORDER — GLYCOPYRROLATE 1 MG PO TABS: 1.0000 mg | ORAL_TABLET | ORAL | Status: DC | PRN

## 2022-05-02 MED ORDER — POLYVINYL ALCOHOL 1.4 % OP SOLN
1.0000 [drp] | Freq: Four times a day (QID) | OPHTHALMIC | Status: DC | PRN
Start: 2022-05-02 — End: 2022-05-03

## 2022-05-02 MED ORDER — SODIUM CHLORIDE 0.9 % IV SOLN: INTRAVENOUS | Status: DC

## 2022-05-24 NOTE — Death Summary Note (Signed)
DEATH SUMMARY   Patient Details  Name: Lucas [name] MRN: [id number] DOB: March 28, 1951  Admission/Discharge Information   Admit Date:  05/22/2022  Date of Death: Date of Death: 2022/05/04  Time of Death: Time of Death: January 03, 2244  Length of Stay: 3  Referring Physician: Rise Patience, DO   Reason(s) for Hospitalization  Cardiac Arrest, Respiratory Failure suspected secondary to end stage COPD  Diagnoses  Preliminary cause of death:  Secondary Diagnoses (including complications and co-morbidities):  Principal Problem:   Cardiac arrest [facility]) Active Problems:   Alcohol use disorder, severe, dependence ([place])   COPD with chronic bronchitis ([place])   Cirrhosis of liver ([place])   Acute and chronic respiratory failure with hypoxia ([place])   Lung mass   Brief Hospital Course (including significant findings, care, treatment, and services provided and events leading to death)  71 yo M, PMH HTN, HLD, COPD, active tobacco use. Stage 1 squamous cell carcinoma RUL s/p SBRT (Dr. Valeta Harms clinic patient). 05/03/23 patient was having trouble breathing at home. Per the patients wife he has been having trouble with multiple spells per day. Increased shortness of breath. They finally made the decision to call EMS. Once EMS arrived he was found in respiratory distress, he became bradycardic, and ultimately suffered PEA cardiac arrest. CPR was started. He was placed on a lucas device. He received 30+ mins of chest compressions followed by ROSC requiring intubation. In ED goals of care conversation with wife lead to code status change to DNR, however at the time wished to continue all further medical therapies. ICU stay complicated with encephalopathy with concerns of anoxic brain injury and myoclonus. Started with Keppra and Unasyn for possible aspiration event. EEG with epileptogenic with generalized onset and high risk of seizure as well as profound diffuse encephalopathy consistent with anoxic-hypoxic brain injury. 7/10 with no  improvement in mentation/neurological status. Family with decision to transition to comfort measures. Patient expired May 04, 2022 01/03/44.    Pertinent Labs and Studies  Significant Diagnostic Studies DG Abd Portable 1V  Result Date: May 02, 2022 CLINICAL DATA:  Orogastric tube placement. EXAM: PORTABLE ABDOMEN - 1 VIEW COMPARISON:  January 15, 2022 FINDINGS: Limited visualization of the upper abdomen demonstrates enteric catheter coiled over the expected location of gastric cardia. Nonobstructive bowel gas pattern. IMPRESSION: Enteric catheter coiled over the expected location of gastric cardia. Electronically Signed   By: Fidela Salisbury M.D.   On: 05/12/2022 19:06   DG Chest Portable 1 View  Result Date: 05/01/2022 CLINICAL DATA:  Endotracheal tube retraction EXAM: PORTABLE CHEST 1 VIEW COMPARISON:  05/10/2022, 12:59 p.m. FINDINGS: Interval retraction of endotracheal tube, which is positioned with tip over the mid trachea. Cardiomegaly status post median sternotomy and CABG. Treated mass of the peripheral right upper lobe. No acute appearing airspace opacity. IMPRESSION: Interval retraction of endotracheal tube, which is now positioned with tip over the mid trachea. Electronically Signed   By: Delanna Ahmadi M.D.   On: May 02, 2022 16:10   CT Angio Chest/Abd/Pel for Dissection W and/or Wo Contrast  Result Date: 05/16/2022 CLINICAL DATA:  Found unresponsive, status post CPR with ROSC, lung cancer * Tracking Code: BO * EXAM: CT ANGIOGRAPHY CHEST, ABDOMEN AND PELVIS TECHNIQUE: Non-contrast CT of the chest was initially obtained. Multidetector CT imaging through the chest, abdomen and pelvis was performed using the standard protocol during bolus administration of intravenous contrast. Multiplanar reconstructed images and MIPs were obtained and reviewed to evaluate the vascular anatomy. RADIATION DOSE REDUCTION: This exam was performed according to the departmental  dose-optimization program which includes  automated exposure control, adjustment of the mA and/or kV according to patient size and/or use of iterative reconstruction technique. CONTRAST:  153mL OMNIPAQUE IOHEXOL 350 MG/ML SOLN COMPARISON:  CT chest angiogram, 01/03/2022 CT chest, 08/25/2021, PET-CT, 06/05/2020 FINDINGS: CTA CHEST FINDINGS VASCULAR Aorta: Satisfactory opacification of the aorta. Normal contour and caliber of the thoracic aorta. No evidence of aneurysm, dissection, or other acute aortic pathology. Scattered aortic atherosclerosis. Cardiovascular: No evidence of pulmonary embolism on limited non-tailored examination. Mild cardiomegaly. Extensive three-vessel coronary artery calcifications and/or stents. Enlargement of the main pulmonary artery, measuring up to 3.7 cm in caliber. No pericardial effusion. Review of the MIP images confirms the above findings. NON VASCULAR Mediastinum/Nodes: No enlarged mediastinal, hilar, or axillary lymph nodes. Diffusely fluid-filled esophagus. Thyroid gland demonstrates no significant findings. Lungs/Pleura: Endotracheal tube positioned with tip in the right mainstem bronchus (series 7, image 64). Moderate to severe centrilobular emphysema and diffuse bilateral bronchial wall thickening. Unchanged post treatment appearance of the peripheral right upper lobe with a dense spiculated treated pulmonary nodule this adjacent fiducial markers measuring proximally 3.4 x 3.1 cm (series 8, image 47). No pleural effusion or pneumothorax. Musculoskeletal: No chest wall abnormality. Multiple nondisplaced fractures of the anterior ribs bilaterally. Review of the MIP images confirms the above findings. CTA ABDOMEN AND PELVIS FINDINGS VASCULAR Normal contour and caliber of the abdominal aorta. No evidence of aneurysm, dissection, or other acute aortic pathology. Direct origin of the celiac branch vessels from the aorta with otherwise standard branching pattern of the abdominal aorta and solitary bilateral renal arteries.  Moderate mixed calcific atherosclerosis. Review of the MIP images confirms the above findings. NON-VASCULAR Hepatobiliary: No solid liver abnormality is seen. No gallstones, gallbladder wall thickening, or biliary dilatation. Pancreas: Unremarkable. No pancreatic ductal dilatation or surrounding inflammatory changes. Spleen: Normal in size without significant abnormality. Adrenals/Urinary Tract: Adrenal glands are unremarkable. Kidneys are normal, without renal calculi, solid lesion, or hydronephrosis. Thickening of the urinary bladder wall, likely secondary to chronic outlet obstruction. Foley catheter within the urinary bladder. Stomach/Bowel: Stomach is within normal limits. Appendix appears normal. No evidence of bowel wall thickening, distention, or inflammatory changes. Diffusely fluid-filled colon to the rectum. Lymphatic: No enlarged abdominal or pelvic lymph nodes. Reproductive: Prostatomegaly. Other: No abdominal wall hernia or abnormality. No ascites. Musculoskeletal: No acute osseous findings. IMPRESSION: 1. Normal contour and caliber of the thoracic and abdominal aorta, without evidence of aneurysm, dissection, or other acute aortic pathology. Moderate mixed calcific atherosclerosis. 2. Endotracheal tube positioned with tip in the right mainstem bronchus. Recommend retraction. 3. Unchanged post treatment appearance of the peripheral right upper lobe with a dense spiculated treated pulmonary nodule. 4. Multiple bilateral nondisplaced fractures of the anterior ribs, in keeping CPR. 5. Diffusely fluid-filled esophagus, potentially placing the patient at risk for aspiration. 6. Moderate to severe emphysema and diffuse bilateral bronchial wall thickening. 7. Enlargement of the main pulmonary artery, as can be seen in pulmonary arterial hypertension. 8. Diffusely fluid-filled colon, in keeping with diarrheal illness. These results were called by telephone at the time of interpretation on 05/05/2022 at 3:79 pm  to Dr. Campbell Stall , who verbally acknowledged these results. Aortic Atherosclerosis (ICD10-I70.0) and Emphysema (ICD10-J43.9). Electronically Signed   By: Delanna Ahmadi M.D.   On: 04/29/2022 14:53   CT Head Wo Contrast  Result Date: 04/27/2022 CLINICAL DATA:  Unresponsive. EXAM: CT HEAD WITHOUT CONTRAST TECHNIQUE: Contiguous axial images were obtained from the base of the skull through the vertex without intravenous  contrast. RADIATION DOSE REDUCTION: This exam was performed according to the departmental dose-optimization program which includes automated exposure control, adjustment of the mA and/or kV according to patient size and/or use of iterative reconstruction technique. COMPARISON:  01/15/2022. FINDINGS: Brain: No evidence of acute infarction, hemorrhage, hydrocephalus, extra-axial collection or mass lesion/mass effect. Remote right basal ganglia lacunar infarct. There is mild diffuse low-attenuation within the subcortical and periventricular white matter compatible with chronic microvascular disease. Chronic bilateral occipital lobe encephalomalacia is again noted which communicates with the occipital horns of both ventricles. Vascular: No hyperdense vessel or unexpected calcification. Skull: Normal. Negative for fracture or focal lesion. Sinuses/Orbits: No acute finding. Other: None. IMPRESSION: 1. No acute intracranial abnormalities. 2. Chronic small vessel ischemic disease 3. Chronic bilateral occipital lobe encephalomalacia, which may reflect a congenital anomaly. Electronically Signed   By: Kerby Moors M.D.   On: 04/29/2022 14:39   DG Chest Portable 1 View  Result Date: 05/09/2022 CLINICAL DATA:  confirm Et tube EXAM: PORTABLE CHEST 1 VIEW COMPARISON:  January 17, 2022. FINDINGS: Right mainstem ET tube intubation. Redemonstrated right upper lung mass. Similar clips in this region. Emphysematous change. No confluent consolidation. No visible pleural effusions or pneumothorax. Similar  cardiomediastinal silhouette. CABG and median sternotomy. Remote right rib fractures. IMPRESSION: 1. Right mainstem ET tube intubation.  Recommend retraction. 2. Chronic findings, detailed above. Findings discussed with Dr. Pearline Cables via telephone at 2:10 p.m. Electronically Signed   By: Margaretha Sheffield M.D.   On: 04/26/2022 14:12    Microbiology Recent Results (from the past 240 hour(s))  MRSA Next Gen by PCR, Nasal     Status: None   Collection Time: 05/01/2022  6:06 PM   Specimen: Nasal Mucosa; Nasal Swab  Result Value Ref Range Status   MRSA by PCR Next Gen NOT DETECTED NOT DETECTED Final    Comment: (NOTE) The GeneXpert MRSA Assay (FDA approved for NASAL specimens only), is one component of a comprehensive MRSA colonization surveillance program. It is not intended to diagnose MRSA infection nor to guide or monitor treatment for MRSA infections. Test performance is not FDA approved in patients less than 25 years old. Performed at [facility], [place] [address], [place],  [address]     Lab Basic Metabolic Panel: Recent Labs  Lab 05/13/2022 1217 05/06/2022 1317 05/01/22 1257 04/25/2022 0117  NA 139 133* 142 142  K 4.7 4.3 3.5 3.9  CL 99  --  102 104  CO2 18*  --  27 26  GLUCOSE 165*  --  155* 115*  BUN 8  --  13 9  CREATININE 1.05  --  0.91 0.89  CALCIUM 9.1  --  9.0 8.9   Liver Function Tests: Recent Labs  Lab 05/13/2022 1217  AST 185*  ALT 73*  ALKPHOS 105  BILITOT 0.5  PROT 6.7  ALBUMIN 3.4*   Recent Labs  Lab 05/04/2022 1217  LIPASE 71*   No results for input(s): "AMMONIA" in the last 168 hours. CBC: Recent Labs  Lab 04/27/2022 1217 05/17/2022 1317 05/01/22 1257 05/09/2022 0117  WBC 3.9*  --  10.5 12.4*  NEUTROABS 1.8  --   --   --   HGB 11.0* 10.2* 11.0* 11.3*  HCT 35.3* 30.0* 34.2* 34.3*  MCV 99.4  --  96.6 95.8  PLT 148*  --  144* 139*   Cardiac Enzymes: No results for input(s): "CKTOTAL", "CKMB", "CKMBINDEX", "TROPONINI" in the last 168  hours. Sepsis Labs: Recent Labs  Lab 05/09/2022  1217 05/01/22 1257 05/04/2022 0117  WBC 3.9* 10.5 12.4*    Hayden Pedro, AGACNP-BC [facility]  PCCM Pgr: [phone]

## 2022-05-24 NOTE — Progress Notes (Signed)
Patient's HR showing Asystole on telemetry monitor. Time of death confirmed at 01-14-2244 by Waymon Budge, RN and Yolande Jolly, RN. Elink made aware. Wife updated and coming to beside.

## 2022-05-24 NOTE — Progress Notes (Signed)
NAME:  Lucas [name], MRN:  [id number], DOB:  1950-11-20, LOS: 2 ADMISSION DATE:  05/09/2022, CONSULTATION DATE:  05/10/2022 REFERRING MD:  Dr. Pearline Cables, CHIEF COMPLAINT:  Cardiac arrest   History of Present Illness:  71 yo M, PMH HTN, HLD, COPD, active tobacco use. Stage 1 squamous cell carcinoma RUL s/p SBRT (he is my clinic patient). Today the patient was having trouble breathing at home. Per the patients wife he has been having trouble with multiple spells per day. Increased shortness of breath. They finally made the decision to call EMS. Once EMS arrived he was found in respiratory distress, he became bradycardic, and ultimately suffered PEA cardiac arrest. CPR was started. He was placed on a lucas device. He received 30+ mins of chest compressions followed by ROSC.    I met with the patient family in the ED. He wife is obviously very distraught. She states that he would on want to be on life support or any of these things. She would like to at least make him a DNR and give him some time. She is prepared herself and her family that he may no survive this.    In the ED, the patients BP is stable off pressors. He is lightly sedated on propofol.   Pertinent  Medical History   Past Medical History:  Diagnosis Date   Acute blood loss anemia 06/21/2018   Acute upper GI bleed 06/21/2018   Allergy    Bilateral AVN of femurs ([place]) 06/25/2018   See 11/2017 Chest CT report   COPD 12/21/2006   Environmental allergies    History of femur fracture 06/25/2018   11/2017 CT AP showed Remote nonunited right femur greater trochanter fracture   History of Fracture of right tibia 06/26/2018   History of multiple right thoacic rib fractures 06/25/2018   History of Right clavicular fracture 06/25/2018   HYPERLIPIDEMIA 12/21/2006   HYPERTENSION, BENIGN SYSTEMIC 12/21/2006   Mallory-Weiss tear    TOBACCO DEPENDENCE 12/21/2006    Significant Hospital Events: Including procedures, antibiotic start and stop dates in addition  to other pertinent events   07/09 Admitted to ICU  Interim History / Subjective:  No significant changes overnight. He still requires ventilatory support.  Objective   Blood pressure 117/69, pulse 99, temperature 100 F (37.8 C), resp. rate 18, height 5' 8"  (1.727 m), weight 69 kg, SpO2 98 %.    Vent Mode: PRVC FiO2 (%):  [30 %-40 %] 30 % Set Rate:  [18 bmp] 18 bmp Vt Set:  [550 mL] 550 mL PEEP:  [5 cmH20] 5 cmH20 Plateau Pressure:  [17 cmH20-24 cmH20] 22 cmH20   Intake/Output Summary (Last 24 hours) at 05/07/2022 0750 Last data filed at 05/17/2022 0600 Gross per 24 hour  Intake 460.48 ml  Output 820 ml  Net -359.52 ml   Filed Weights   04/29/2022 1214 05/01/2022 1730  Weight: 65 kg 69 kg    Examination: General: Stated age, intubated in bed. HENT: Intubated. Lungs: Clear to bilateral auscultation, on ventilator. Cardiovascular: Regular rate and rhythm, no murmurs, rubs, gallops. Abdomen: Soft, nontender, nondistended. Extremities: Negative for extremity edema.  Neuro: GCS 3. +cough and gag reflexes. Some fixed midline gaze noted. Myoclonic juerks persist.  Resolved Hospital Problem list     Assessment & Plan:  PEA cardiac arrest Suspected to be mediated by COPD/end-stage disease. On admission he exhibited myoclonus with generalized epileptogenicity, raising concern for anoxic injury; this is unchanged on my exam today. He is mildly febrile at  100.19F, tachypneic with tachycardia; BP stable. He remains on full vent support. -Ongoing discussions with family -Continue unasyn for aspiration event -Continue cerribel -GOC conversation today  COPD Tobacco use  Best Practice (right click and "Reselect all SmartList Selections" daily)   Diet/type: NPO DVT prophylaxis: prophylactic heparin  GI prophylaxis: PPI Lines: 2x PIV Foley:  Yes, and it is still needed Code Status:  DNR Last date of multidisciplinary goals of care discussion [07/09]  Labs   CBC: Recent Labs   Lab 05/05/2022 1217 05/09/2022 1317 05/01/22 1257 05/14/2022 0117  WBC 3.9*  --  10.5 12.4*  NEUTROABS 1.8  --   --   --   HGB 11.0* 10.2* 11.0* 11.3*  HCT 35.3* 30.0* 34.2* 34.3*  MCV 99.4  --  96.6 95.8  PLT 148*  --  144* 139*    Basic Metabolic Panel: Recent Labs  Lab 05/12/2022 1217 05/12/2022 1317 05/01/22 1257 05/08/2022 0117  NA 139 133* 142 142  K 4.7 4.3 3.5 3.9  CL 99  --  102 104  CO2 18*  --  27 26  GLUCOSE 165*  --  155* 115*  BUN 8  --  13 9  CREATININE 1.05  --  0.91 0.89  CALCIUM 9.1  --  9.0 8.9   GFR: Estimated Creatinine Clearance: 73.7 mL/min (by C-G formula based on SCr of 0.89 mg/dL). Recent Labs  Lab 05/16/2022 1217 05/01/22 1257 05/08/2022 0117  WBC 3.9* 10.5 12.4*    Liver Function Tests: Recent Labs  Lab 04/23/2022 1217  AST 185*  ALT 73*  ALKPHOS 105  BILITOT 0.5  PROT 6.7  ALBUMIN 3.4*   Recent Labs  Lab 04/24/2022 1217  LIPASE 71*   No results for input(s): "AMMONIA" in the last 168 hours.  ABG    Component Value Date/Time   PHART 7.417 04/26/2022 1317   PCO2ART 35.4 05/16/2022 1317   PO2ART 99 05/04/2022 1317   HCO3 23.3 05/19/2022 1317   TCO2 24 04/28/2022 1317   ACIDBASEDEF 2.0 05/16/2022 1317   O2SAT 98 04/26/2022 1317     Coagulation Profile: No results for input(s): "INR", "PROTIME" in the last 168 hours.  Cardiac Enzymes: No results for input(s): "CKTOTAL", "CKMB", "CKMBINDEX", "TROPONINI" in the last 168 hours.  HbA1C: Hgb A1c MFr Bld  Date/Time Value Ref Range Status  09/07/2020 04:42 AM 5.5 4.8 - 5.6 % Final    Comment:    (NOTE) Pre diabetes:          5.7%-6.4%  Diabetes:              >6.4%  Glycemic control for   <7.0% adults with diabetes   05/02/2020 12:28 PM 5.5 4.8 - 5.6 % Final    Comment:    (NOTE) Pre diabetes:          5.7%-6.4%  Diabetes:              >6.4%  Glycemic control for   <7.0% adults with diabetes     CBG: Recent Labs  Lab 05/01/22 1645 05/01/22 1919 05/01/22 2325  04/25/2022 0418 05/13/2022 0745  GLUCAP 136* 121* 109* 79 86    Review of Systems:   Unable to obtain: Intubated  Past Medical History:  He,  has a past medical history of Acute blood loss anemia (06/21/2018), Acute upper GI bleed (06/21/2018), Allergy, Bilateral AVN of femurs ([place]) (06/25/2018), COPD (12/21/2006), Environmental allergies, History of femur fracture (06/25/2018), History of Fracture of right tibia (06/26/2018), History  of multiple right thoacic rib fractures (06/25/2018), History of Right clavicular fracture (06/25/2018), HYPERLIPIDEMIA (12/21/2006), HYPERTENSION, BENIGN SYSTEMIC (12/21/2006), Mallory-Weiss tear, and TOBACCO DEPENDENCE (12/21/2006).   Surgical History:   Past Surgical History:  Procedure Laterality Date   ANKLE SURGERY     left ankle   APPLICATION OF WOUND VAC  01/06/2012   Procedure: APPLICATION OF WOUND VAC;  Surgeon: Meredith Pel, MD;  Location: [facility];  Service: Orthopedics;  Laterality: Left;   APPLICATION OF WOUND VAC Right 10/17/2015   Procedure: APPLICATION OF WOUND VAC;  Surgeon: Leandrew Koyanagi, MD;  Location: [facility];  Service: Orthopedics;  Laterality: Right;   BIOPSY  06/22/2018   Procedure: BIOPSY;  Surgeon: Dorothea Ogle v, DO;  Location: [facility];  Service: Gastroenterology;;   BRONCHIAL BIOPSY  05/04/2020   Procedure: BRONCHIAL BIOPSIES;  Surgeon: Garner Nash, DO;  Location: [facility];  Service: Pulmonary;;   BRONCHIAL BRUSHINGS  05/04/2020   Procedure: BRONCHIAL BRUSHINGS;  Surgeon: Garner Nash, DO;  Location: [facility];  Service: Pulmonary;;   BRONCHIAL NEEDLE ASPIRATION BIOPSY  05/04/2020   Procedure: BRONCHIAL NEEDLE ASPIRATION BIOPSIES;  Surgeon: Garner Nash, DO;  Location: [facility];  Service: Pulmonary;;   BRONCHIAL WASHINGS  05/04/2020   Procedure: BRONCHIAL WASHINGS;  Surgeon: Garner Nash, DO;  Location: [facility];  Service: Pulmonary;;   CORONARY ARTERY BYPASS GRAFT N/A 12/16/2019   Procedure: CORONARY ARTERY  BYPASS GRAFTING (CABG) TIMES 4 USING LEFT INTERNAL MAMMARY ARTERY AND ENDOSCOPICALLY HARVESTED RIGHT SAPHENOUS VEIN;  Surgeon: Lajuana Matte, MD;  Location: [facility];  Service: Open Heart Surgery;  Laterality: N/A;   ESOPHAGOGASTRODUODENOSCOPY (EGD) WITH PROPOFOL N/A 06/22/2018   Procedure: ESOPHAGOGASTRODUODENOSCOPY (EGD) WITH PROPOFOL;  Surgeon: [id number] v, DO;  Location: [facility];  Service: Gastroenterology;  Laterality: N/A;   EXTERNAL FIXATION LEG  01/06/2012   Procedure: EXTERNAL FIXATION LEG;  Surgeon: Meredith Pel, MD;  [facility];  Service: Orthopedics;  Laterality: Left;  Smith-nephew external fixator set   EXTERNAL FIXATION LEG Right 09/16/2015   Procedure: EXTERNAL FIXATION LEG/LARGE;  Surgeon: Mcarthur Rossetti, MD;  Location: [facility];  Service: Orthopedics;  Laterality: Right;   EXTERNAL FIXATION REMOVAL Right 09/22/2015   Procedure: REMOVAL EXTERNAL FIXATION LEG;  Surgeon: Mcarthur Rossetti, MD;  Location: [facility];  Service: Orthopedics;  Laterality: Right;   FASCIOTOMY  01/10/2012   Procedure: FASCIOTOMY;  Surgeon: Rozanna Box, MD;  Location: [facility];  Service: Orthopedics;  Laterality: Left;  status post fasciotomy with wound vac left calf; adjustment external fixator left tibia under fluoro; retention suture/wound vac placement left lateral calf wound   FASCIOTOMY  01/06/2012   Procedure: FASCIOTOMY;  Surgeon: Meredith Pel, MD;  [facility];  Service: Orthopedics;  Laterality: Left;  Four compartment fasciotomy   FIDUCIAL MARKER PLACEMENT  05/04/2020   Procedure: FIDUCIAL MARKER PLACEMENT;  Surgeon: Garner Nash, DO;  Location: [facility];  Service: Pulmonary;;   HOT HEMOSTASIS N/A 06/22/2018   Procedure: HEMOSTASIS;  Surgeon: Dorothea Ogle v, DO;  Location: [facility];  Service: Gastroenterology;  Laterality: N/A;  Epinepherine and clip placed   I & D EXTREMITY  01/12/2012   Procedure: IRRIGATION AND DEBRIDEMENT EXTREMITY;   Surgeon: Rozanna Box, MD;  Location: [facility] [place];  Service: Orthopedics;  Laterality: Left;  LAYERD CLOSURE LEFT LEG WOUND   I & D EXTREMITY Right 10/17/2015   Procedure: IRRIGATION AND DEBRIDEMENT EXTREMITY;  Surgeon: Leandrew Koyanagi, MD;  Location: [facility];  Service: Orthopedics;  Laterality: Right;   I & D EXTREMITY Right 10/20/2015   Procedure: REPEAT IRRIGATION AND DEBRIDEMENT EXTREMITY, right lower leg;  Surgeon: Mcarthur Rossetti, MD;  Location: [facility];  Service: Orthopedics;  Laterality: Right;   KNEE SURGERY     left knee and right knee   LEFT HEART CATH AND CORONARY ANGIOGRAPHY N/A 06/27/2018   Procedure: LEFT HEART CATH AND CORONARY ANGIOGRAPHY;  Surgeon: Martinique, Peter M, MD;  Location: [facility];  Service: Cardiovascular;  Laterality: N/A;   LEFT HEART CATH AND CORONARY ANGIOGRAPHY N/A 12/12/2019   Procedure: LEFT HEART CATH AND CORONARY ANGIOGRAPHY;  Surgeon: Wellington Hampshire, MD;  Location: [facility];  Service: Cardiovascular;  Laterality: N/A;   MASS EXCISION Left 09/22/2015   Procedure: EXCISION MASS;  Surgeon: Mcarthur Rossetti, MD;  Location: [facility];  Service: Orthopedics;  Laterality: Left;   ORIF TIBIA PLATEAU  02/07/2012   Procedure: OPEN REDUCTION INTERNAL FIXATION (ORIF) TIBIAL PLATEAU;  Surgeon: Rozanna Box, MD;  Location: [facility];  Service: Orthopedics;  Laterality: Left;  ORIF LEFT TIBIAL PLATEAU/ REMOVE EXTERNAL FIXATION LEFT LEG   ORIF TIBIA PLATEAU Right 09/22/2015   Procedure: REMOVAL OF EXTERNAL FIXATOR RIGHT LEG, OPEN REDUCTION INTERNAL FIXATION (ORIF) RIGHT TIBIAL PLATEAU, EXCISION SMALL MASS LEFT LEG;  Surgeon: Mcarthur Rossetti, MD;  Location: [place] [facility];  Service: Orthopedics;  Laterality: Right;   SKIN SPLIT GRAFT Right 10/23/2015   Procedure: RIGHT GASTROC FLAP SPLIT THICKNESS SKIN GRAFT FROM RIGHT THING TO RIGHT LEG;  Surgeon: Irene Limbo, MD;  Location: [facility];  Service: Plastics;  Laterality: Right;   TEE WITHOUT CARDIOVERSION N/A  12/16/2019   Procedure: TRANSESOPHAGEAL ECHOCARDIOGRAM (TEE);  Surgeon: Lajuana Matte, MD;  Location: [place];  Service: Open Heart Surgery;  Laterality: N/A;   VIDEO BRONCHOSCOPY WITH ENDOBRONCHIAL NAVIGATION Right 05/04/2020   Procedure: VIDEO BRONCHOSCOPY WITH ENDOBRONCHIAL NAVIGATION;  Surgeon: Garner Nash, DO;  Location: [facility];  Service: Pulmonary;  Laterality: Right;   WRIST SURGERY     left     Social History:   reports that he has been smoking cigarettes. He has a 25.50 pack-year smoking history. He quit smokeless tobacco use about 57 years ago.  His smokeless tobacco use included snuff and chew. He reports current alcohol use of about 6.0 standard drinks of alcohol per week. He reports that he does not use drugs.   Family History:  His family history includes Emphysema in his father.   Allergies Allergies  Allergen Reactions   Ace Inhibitors Swelling   Ampicillin Nausea And Vomiting   Penicillins Nausea And Vomiting    Did it involve swelling of the face/tongue/throat, SOB, or low BP? N Did it involve sudden or severe rash/hives, skin peeling, or any reaction on the inside of your mouth or nose? N Did you need to seek medical attention at a hospital or doctor's office? N When did it last happen?   Teenager    If all above answers are "NO", may proceed with cephalosporin use.   Codeine Nausea And Vomiting   Lisinopril Swelling    REACTION: angioedema   Aspirin     Nose bleeds     Home Medications  Prior to Admission medications   Medication Sig Start Date End Date Taking? Authorizing Provider  albuterol (VENTOLIN HFA) 108 (90 Base) MCG/ACT inhaler INHALE 2 PUFFS BY MOUTH EVERY 4 HOURS FOR WHEEZING OR SHORTNESS OF BREATH 03/07/22   Brimage, Ronnette Juniper, DO  diclofenac Sodium (  VOLTAREN) 1 % GEL Apply 2 g topically 4 (four) times daily. Patient taking differently: Apply 2 g topically 4 (four) times daily as needed (pain). 12/28/20   [name], DO   Fluticasone-Umeclidin-Vilant (TRELEGY ELLIPTA) 100-62.5-25 MCG/ACT AEPB Inhale 1 puff into the lungs daily. 01/05/22   [name], DO  hydrocortisone cream 1 % Apply topically 2 (two) times daily. 01/10/22   Shary Key, DO  hydrOXYzine (ATARAX) 10 MG tablet TAKE 1 TABLET(10 MG) BY MOUTH EVERY 8 HOURS AS NEEDED FOR ANXIETY 01/25/22   Brimage, Vondra, DO  ipratropium-albuterol (DUONEB) 0.5-2.5 (3) MG/3ML SOLN Take 3 mLs by nebulization every 6 (six) hours as needed. Patient taking differently: Take 3 mLs by nebulization every 6 (six) hours as needed (for wheezing and shortness of breath). 09/10/20   Ghimire, Henreitta Leber, MD  metoprolol tartrate (LOPRESSOR) 25 MG tablet TAKE 1 TABLET(25 MG) BY MOUTH TWICE DAILY Patient taking differently: Take 25 mg by mouth 2 (two) times daily. 08/24/21   Brimage, Ronnette Juniper, DO  nitroGLYCERIN (NITROSTAT) 0.4 MG SL tablet Place 1 tablet (0.4 mg total) under the tongue every 5 (five) minutes as needed for chest pain. 12/10/21   Brimage, Ronnette Juniper, DO  predniSONE (DELTASONE) 10 MG tablet Take 4 tablets daily X 2 days, then, Take 3 tablets daily X 2 days, then, Take 2 tablets daily X 2 days, then, Take 1 tablets daily X 1 day. 01/20/22   Terrilee Croak, MD  rosuvastatin (CRESTOR) 40 MG tablet Take 40 mg by mouth daily. 11/02/21   [provider]  loratadine (CLARITIN) 10 MG tablet Take 1 tablet (10 mg total) by mouth daily. 03/04/11 05/23/18  Mariana Arn, MD     Critical care time: 35 minutes

## 2022-05-24 NOTE — Progress Notes (Signed)
Patient was compassionately extubated at 30 per MD order/family wishes.

## 2022-05-24 NOTE — Progress Notes (Signed)
eLink Physician-Brief Progress Note Patient Name: Lucas [name] DOB: [birth year] MRN: [id number]   Date of Service  05/09/2022  HPI/Events of Note  Received request to change fentanyl PRN pushes instead of bolus from bag.  Pt is not on fentanyl gtt at this time.   eICU Interventions  Fentanyl 42mcg IV push prn ordered.      Intervention Category Intermediate Interventions: Pain - evaluation and management  Elsie Lincoln 05/09/2022, 2:09 AM

## 2022-05-24 NOTE — TOC Progression Note (Signed)
Transition of Care [facility]) - Initial/Assessment Note    Patient Details  Name: Lucas [name] MRN: [id number] Date of Birth: October 14, 1951  [facility] of [facility]) CM/SW Contact:    Milinda Antis, LCSWA Phone Number: 04/29/2022, 3:06 PM  Clinical Narrative:                  Transition of Care Department [facility]) has reviewed patient.  Patient is DNR and was extubated today.  We will continue to monitor patient advancement through interdisciplinary progression rounds. If new patient transition needs arise, please place a TOC consult.          Patient Goals and CMS Choice        Expected Discharge Plan and Services                                                Prior Living Arrangements/Services                       Activities of Daily Living      Permission Sought/Granted                  Emotional Assessment              Admission diagnosis:  Cardiac arrest ([place]) [I46.9] Respiratory arrest ([place]) [R09.2] Bradycardia [R00.1] Closed fracture of multiple ribs, unspecified laterality, initial encounter [S22.49XA] Patient Active Problem List   Diagnosis Date Noted   Cardiac arrest ([place]) 05/22/2022   Pressure injury of skin 01/16/2022   COPD with exacerbation ([place]) 01/08/2022   Malnutrition of moderate degree 01/04/2022   Atrial fibrillation with controlled ventricular response ([place])    Anxiety 12/31/2020   Neck pain 12/31/2020   COPD (chronic obstructive pulmonary disease) ([place]) 09/28/2020   Protein-calorie malnutrition, severe 09/09/2020   COPD with acute exacerbation ([place]) 09/07/2020   Malignant neoplasm of right upper lobe of lung ([place]) 05/26/2020   Lung mass 05/04/2020   Acute and chronic respiratory failure with hypoxia ([place]) 05/02/2020   Pulmonary nodule 02/10/2020   Pleural effusion, right    S/P CABG x 4 12/16/2019   Coronary artery disease 12/16/2019   Unstable angina ([place]) 12/12/2019   NSTEMI (non-ST elevated myocardial  infarction) ([place]) 12/12/2019   CAD (coronary artery disease) 07/11/2018   H. pylori infection 07/03/2018   Chronic stable angina ([place]) 07/03/2018   Duodenal ulcer due to Helicobacter pylori    Bilateral AVN of femurs ([place]) 06/25/2018   Chronic cough 06/25/2018   Mallory-Weiss tear    Gastritis and gastroduodenitis    Cirrhosis of liver ([place])    Tubular adenoma of colon 01/24/2017   Epistaxis 01/01/2015   COPD exacerbation ([place]) 02/13/2013   Ganglion cyst 01/03/2012   PERIPHERAL NEUROPATHY 07/15/2010   Alcohol use disorder, severe, dependence ([place]) 05/14/2009   DEFORMITY, FOOT NEC, CONGENITAL 08/22/2007   ALLERGIC RHINITIS, SEASONAL 05/03/2007   Hyperlipidemia 12/21/2006   Tobacco abuse 12/21/2006   COPD with chronic bronchitis ([place]) 12/21/2006   PCP:  Rise Patience, DO Pharmacy:   [facility] [place], [place] [address] [place] [address] Phone: [phone] Fax: [phone]  [facility] [address] [place] [address] Phone: [phone] Fax: [phone]     Social Determinants of Health (  SDOH) Interventions    Readmission Risk Interventions    01/20/2022   11:43 AM 10/01/2020   11:10 AM 09/10/2020    9:56 AM  Readmission Risk Prevention Plan  Transportation Screening Complete Complete Complete  PCP or Specialist Appt within 5-7 Days Complete    Home Care Screening Complete    Medication Review (RN CM) Complete    PCP or Specialist appointment within 3-5 days of discharge  Complete Complete  HRI or Home Care Consult  Complete Patient refused  SW Recovery Care/Counseling Consult  Complete Complete  Palliative Care Screening  Not Applicable Not [place]  Not Applicable Not Applicable

## 2022-05-24 NOTE — Progress Notes (Signed)
51mls of Dilaudid wasted into stericycle with Yolande Jolly, RN

## 2022-05-24 DEATH — deceased

## 2022-07-25 ENCOUNTER — Ambulatory Visit: Payer: Self-pay | Admitting: Radiation Oncology
